# Patient Record
Sex: Female | Born: 1950
Health system: Southern US, Community
[De-identification: ages and names within clinical notes are randomized; demographics above are authoritative.]

## PROBLEM LIST (undated history)

## (undated) DIAGNOSIS — J439 Emphysema, unspecified: Secondary | ICD-10-CM

## (undated) DIAGNOSIS — R112 Nausea with vomiting, unspecified: Secondary | ICD-10-CM

## (undated) DIAGNOSIS — K449 Diaphragmatic hernia without obstruction or gangrene: Secondary | ICD-10-CM

## (undated) DIAGNOSIS — F419 Anxiety disorder, unspecified: Secondary | ICD-10-CM

## (undated) DIAGNOSIS — K5730 Diverticulosis of large intestine without perforation or abscess without bleeding: Secondary | ICD-10-CM

## (undated) DIAGNOSIS — E785 Hyperlipidemia, unspecified: Secondary | ICD-10-CM

## (undated) DIAGNOSIS — Z9889 Other specified postprocedural states: Secondary | ICD-10-CM

## (undated) DIAGNOSIS — R519 Headache, unspecified: Secondary | ICD-10-CM

## (undated) DIAGNOSIS — K219 Gastro-esophageal reflux disease without esophagitis: Secondary | ICD-10-CM

## (undated) DIAGNOSIS — H409 Unspecified glaucoma: Secondary | ICD-10-CM

## (undated) DIAGNOSIS — M1990 Unspecified osteoarthritis, unspecified site: Secondary | ICD-10-CM

## (undated) DIAGNOSIS — R51 Headache: Secondary | ICD-10-CM

## (undated) DIAGNOSIS — I10 Essential (primary) hypertension: Secondary | ICD-10-CM

## (undated) DIAGNOSIS — L309 Dermatitis, unspecified: Secondary | ICD-10-CM

## (undated) DIAGNOSIS — F32A Depression, unspecified: Secondary | ICD-10-CM

## (undated) DIAGNOSIS — H269 Unspecified cataract: Secondary | ICD-10-CM

## (undated) DIAGNOSIS — F329 Major depressive disorder, single episode, unspecified: Secondary | ICD-10-CM

## (undated) DIAGNOSIS — G8929 Other chronic pain: Secondary | ICD-10-CM

## (undated) DIAGNOSIS — D649 Anemia, unspecified: Secondary | ICD-10-CM

## (undated) DIAGNOSIS — E041 Nontoxic single thyroid nodule: Secondary | ICD-10-CM

## (undated) DIAGNOSIS — I2510 Atherosclerotic heart disease of native coronary artery without angina pectoris: Secondary | ICD-10-CM

## (undated) HISTORY — PX: SPINE SURGERY: SHX786

## (undated) HISTORY — DX: Hyperlipidemia, unspecified: E78.5

## (undated) HISTORY — PX: TUBAL LIGATION: SHX77

## (undated) HISTORY — DX: Unspecified glaucoma: H40.9

## (undated) HISTORY — DX: Essential (primary) hypertension: I10

## (undated) HISTORY — DX: Diverticulosis of large intestine without perforation or abscess without bleeding: K57.30

## (undated) HISTORY — PX: CHOLECYSTECTOMY: SHX55

## (undated) HISTORY — DX: Gastro-esophageal reflux disease without esophagitis: K21.9

## (undated) HISTORY — DX: Dermatitis, unspecified: L30.9

## (undated) HISTORY — DX: Anemia, unspecified: D64.9

## (undated) HISTORY — DX: Emphysema, unspecified: J43.9

## (undated) HISTORY — DX: Anxiety disorder, unspecified: F41.9

## (undated) HISTORY — PX: COSMETIC SURGERY: SHX468

## (undated) HISTORY — DX: Unspecified cataract: H26.9

## (undated) HISTORY — DX: Nontoxic single thyroid nodule: E04.1

## (undated) HISTORY — DX: Major depressive disorder, single episode, unspecified: F32.9

## (undated) HISTORY — DX: Diaphragmatic hernia without obstruction or gangrene: K44.9

## (undated) HISTORY — DX: Unspecified osteoarthritis, unspecified site: M19.90

## (undated) HISTORY — PX: EYE SURGERY: SHX253

## (undated) HISTORY — DX: Depression, unspecified: F32.A

## (undated) HISTORY — DX: Headache: R51

## (undated) HISTORY — DX: Headache, unspecified: R51.9

## (undated) HISTORY — PX: CATARACT EXTRACTION, BILATERAL: SHX1313

## (undated) HISTORY — DX: Other chronic pain: G89.29

---

## 1999-03-29 ENCOUNTER — Other Ambulatory Visit: Admission: RE | Admit: 1999-03-29 | Discharge: 1999-03-29 | Payer: Self-pay | Admitting: Gynecology

## 1999-10-04 ENCOUNTER — Encounter: Payer: Self-pay | Admitting: Emergency Medicine

## 1999-10-04 ENCOUNTER — Emergency Department (HOSPITAL_COMMUNITY): Admission: EM | Admit: 1999-10-04 | Discharge: 1999-10-04 | Payer: Self-pay | Admitting: Emergency Medicine

## 1999-10-31 ENCOUNTER — Encounter: Admission: RE | Admit: 1999-10-31 | Discharge: 2000-01-29 | Payer: Self-pay | Admitting: Family Medicine

## 1999-11-10 ENCOUNTER — Encounter: Payer: Self-pay | Admitting: Family Medicine

## 1999-11-10 ENCOUNTER — Ambulatory Visit (HOSPITAL_COMMUNITY): Admission: RE | Admit: 1999-11-10 | Discharge: 1999-11-10 | Payer: Self-pay | Admitting: Family Medicine

## 1999-12-09 ENCOUNTER — Ambulatory Visit (HOSPITAL_COMMUNITY): Admission: RE | Admit: 1999-12-09 | Discharge: 1999-12-09 | Payer: Self-pay | Admitting: Gastroenterology

## 1999-12-09 ENCOUNTER — Encounter: Payer: Self-pay | Admitting: Gastroenterology

## 1999-12-16 ENCOUNTER — Ambulatory Visit (HOSPITAL_COMMUNITY): Admission: RE | Admit: 1999-12-16 | Discharge: 1999-12-16 | Payer: Self-pay | Admitting: Gynecology

## 1999-12-16 ENCOUNTER — Encounter: Payer: Self-pay | Admitting: Gynecology

## 2000-01-20 ENCOUNTER — Encounter: Payer: Self-pay | Admitting: Family Medicine

## 2000-01-20 ENCOUNTER — Inpatient Hospital Stay (HOSPITAL_COMMUNITY): Admission: EM | Admit: 2000-01-20 | Discharge: 2000-01-23 | Payer: Self-pay | Admitting: Family Medicine

## 2000-01-22 ENCOUNTER — Encounter: Payer: Self-pay | Admitting: Family Medicine

## 2000-05-07 ENCOUNTER — Other Ambulatory Visit: Admission: RE | Admit: 2000-05-07 | Discharge: 2000-05-07 | Payer: Self-pay | Admitting: Gynecology

## 2000-05-09 ENCOUNTER — Encounter (INDEPENDENT_AMBULATORY_CARE_PROVIDER_SITE_OTHER): Payer: Self-pay | Admitting: Specialist

## 2000-05-09 ENCOUNTER — Other Ambulatory Visit: Admission: RE | Admit: 2000-05-09 | Discharge: 2000-05-09 | Payer: Self-pay | Admitting: Gynecology

## 2000-06-09 ENCOUNTER — Emergency Department (HOSPITAL_COMMUNITY): Admission: EM | Admit: 2000-06-09 | Discharge: 2000-06-09 | Payer: Self-pay | Admitting: Emergency Medicine

## 2000-06-09 ENCOUNTER — Encounter: Payer: Self-pay | Admitting: Emergency Medicine

## 2001-06-26 DIAGNOSIS — K449 Diaphragmatic hernia without obstruction or gangrene: Secondary | ICD-10-CM

## 2001-06-26 HISTORY — DX: Diaphragmatic hernia without obstruction or gangrene: K44.9

## 2001-09-16 ENCOUNTER — Other Ambulatory Visit: Admission: RE | Admit: 2001-09-16 | Discharge: 2001-09-16 | Payer: Self-pay | Admitting: Gynecology

## 2001-11-13 ENCOUNTER — Ambulatory Visit (HOSPITAL_COMMUNITY): Admission: RE | Admit: 2001-11-13 | Discharge: 2001-11-13 | Payer: Self-pay | Admitting: Gastroenterology

## 2002-10-28 ENCOUNTER — Other Ambulatory Visit: Admission: RE | Admit: 2002-10-28 | Discharge: 2002-10-28 | Payer: Self-pay | Admitting: Gynecology

## 2003-07-16 ENCOUNTER — Encounter: Admission: RE | Admit: 2003-07-16 | Discharge: 2003-07-16 | Payer: Self-pay | Admitting: Family Medicine

## 2003-08-03 ENCOUNTER — Encounter (HOSPITAL_COMMUNITY): Admission: RE | Admit: 2003-08-03 | Discharge: 2003-11-01 | Payer: Self-pay | Admitting: Family Medicine

## 2003-08-12 ENCOUNTER — Encounter: Admission: RE | Admit: 2003-08-12 | Discharge: 2003-08-12 | Payer: Self-pay | Admitting: Family Medicine

## 2004-03-10 ENCOUNTER — Encounter: Admission: RE | Admit: 2004-03-10 | Discharge: 2004-03-10 | Payer: Self-pay | Admitting: Family Medicine

## 2004-04-14 ENCOUNTER — Other Ambulatory Visit: Admission: RE | Admit: 2004-04-14 | Discharge: 2004-04-14 | Payer: Self-pay | Admitting: Gynecology

## 2004-04-28 ENCOUNTER — Ambulatory Visit: Payer: Self-pay | Admitting: Internal Medicine

## 2004-06-26 HISTORY — PX: CARPAL TUNNEL RELEASE: SHX101

## 2004-07-18 ENCOUNTER — Ambulatory Visit: Payer: Self-pay | Admitting: Internal Medicine

## 2004-09-09 ENCOUNTER — Ambulatory Visit: Payer: Self-pay | Admitting: Family Medicine

## 2004-09-12 ENCOUNTER — Ambulatory Visit: Payer: Self-pay | Admitting: Cardiology

## 2004-10-14 ENCOUNTER — Ambulatory Visit: Payer: Self-pay | Admitting: Cardiovascular Disease

## 2004-10-17 ENCOUNTER — Ambulatory Visit: Payer: Self-pay | Admitting: Cardiology

## 2004-10-18 ENCOUNTER — Ambulatory Visit: Payer: Self-pay | Admitting: Family Medicine

## 2004-12-26 ENCOUNTER — Ambulatory Visit: Payer: Self-pay | Admitting: Internal Medicine

## 2004-12-29 ENCOUNTER — Ambulatory Visit: Payer: Self-pay | Admitting: Internal Medicine

## 2005-02-24 ENCOUNTER — Ambulatory Visit: Payer: Self-pay | Admitting: Family Medicine

## 2005-03-01 ENCOUNTER — Ambulatory Visit: Payer: Self-pay | Admitting: Family Medicine

## 2005-03-09 ENCOUNTER — Ambulatory Visit: Payer: Self-pay | Admitting: Internal Medicine

## 2005-04-19 ENCOUNTER — Ambulatory Visit (HOSPITAL_BASED_OUTPATIENT_CLINIC_OR_DEPARTMENT_OTHER): Admission: RE | Admit: 2005-04-19 | Discharge: 2005-04-19 | Payer: Self-pay | Admitting: Orthopedic Surgery

## 2005-04-19 ENCOUNTER — Ambulatory Visit (HOSPITAL_COMMUNITY): Admission: RE | Admit: 2005-04-19 | Discharge: 2005-04-19 | Payer: Self-pay | Admitting: Orthopedic Surgery

## 2005-07-06 ENCOUNTER — Encounter: Payer: Self-pay | Admitting: Family Medicine

## 2005-07-06 ENCOUNTER — Other Ambulatory Visit: Admission: RE | Admit: 2005-07-06 | Discharge: 2005-07-06 | Payer: Self-pay | Admitting: Family Medicine

## 2005-07-06 ENCOUNTER — Ambulatory Visit: Payer: Self-pay | Admitting: Family Medicine

## 2005-07-12 ENCOUNTER — Ambulatory Visit: Payer: Self-pay | Admitting: Family Medicine

## 2005-07-19 ENCOUNTER — Ambulatory Visit: Payer: Self-pay | Admitting: Family Medicine

## 2005-08-08 ENCOUNTER — Encounter: Admission: RE | Admit: 2005-08-08 | Discharge: 2005-08-08 | Payer: Self-pay | Admitting: Family Medicine

## 2006-04-17 ENCOUNTER — Ambulatory Visit: Payer: Self-pay | Admitting: Family Medicine

## 2006-04-24 ENCOUNTER — Ambulatory Visit: Payer: Self-pay | Admitting: Family Medicine

## 2006-04-24 LAB — CONVERTED CEMR LAB
ALT: 31 units/L (ref 0–40)
AST: 24 units/L (ref 0–37)
Albumin: 3.9 g/dL (ref 3.5–5.2)
Alkaline Phosphatase: 85 units/L (ref 39–117)
BUN: 12 mg/dL (ref 6–23)
CO2: 30 meq/L (ref 19–32)
Calcium: 10 mg/dL (ref 8.4–10.5)
Chloride: 101 meq/L (ref 96–112)
Chol/HDL Ratio, serum: 6.7
Cholesterol: 236 mg/dL (ref 0–200)
Creatinine, Ser: 0.9 mg/dL (ref 0.4–1.2)
Creatinine,U: 54.2 mg/dL
GFR calc non Af Amer: 69 mL/min
Glomerular Filtration Rate, Af Am: 83 mL/min/{1.73_m2}
Glucose, Bld: 143 mg/dL — ABNORMAL HIGH (ref 70–99)
HDL: 35.1 mg/dL — ABNORMAL LOW (ref >39.0)
Hgb A1c MFr Bld: 6.6 % — ABNORMAL HIGH (ref 4.6–6.0)
LDL DIRECT: 163.9 mg/dL
Lipase: 19 units/L (ref 11.0–59.0)
Microalb Creat Ratio: 5.5 mg/g (ref 0.0–30.0)
Microalb, Ur: 0.3 mg/dL (ref 0.0–1.9)
Potassium: 3.9 meq/L (ref 3.5–5.1)
Sodium: 139 meq/L (ref 135–145)
Total Bilirubin: 0.5 mg/dL (ref 0.3–1.2)
Total Protein: 7.3 g/dL (ref 6.0–8.3)
Triglyceride fasting, serum: 216 mg/dL (ref 0–149)
VLDL: 43 mg/dL — ABNORMAL HIGH (ref 0–40)

## 2006-05-04 ENCOUNTER — Ambulatory Visit: Payer: Self-pay | Admitting: *Deleted

## 2006-05-11 ENCOUNTER — Ambulatory Visit: Payer: Self-pay | Admitting: *Deleted

## 2006-05-23 ENCOUNTER — Ambulatory Visit: Payer: Self-pay | Admitting: *Deleted

## 2006-07-16 ENCOUNTER — Other Ambulatory Visit: Admission: RE | Admit: 2006-07-16 | Discharge: 2006-07-16 | Payer: Self-pay | Admitting: Family Medicine

## 2006-07-16 ENCOUNTER — Encounter: Payer: Self-pay | Admitting: Family Medicine

## 2006-07-16 ENCOUNTER — Ambulatory Visit: Payer: Self-pay | Admitting: Family Medicine

## 2006-07-16 LAB — CONVERTED CEMR LAB
ALT: 32 units/L (ref 0–40)
AST: 23 units/L (ref 0–37)
Albumin: 4.1 g/dL (ref 3.5–5.2)
Alkaline Phosphatase: 104 units/L (ref 39–117)
BUN: 8 mg/dL (ref 6–23)
Basophils Absolute: 0.1 10*3/uL (ref 0.0–0.1)
Basophils Relative: 0.9 % (ref 0.0–1.0)
CO2: 30 meq/L (ref 19–32)
Calcium: 9.8 mg/dL (ref 8.4–10.5)
Chloride: 101 meq/L (ref 96–112)
Cholesterol: 218 mg/dL (ref 0–200)
Creatinine, Ser: 0.9 mg/dL (ref 0.4–1.2)
Creatinine,U: 39.1 mg/dL
Direct LDL: 150.6 mg/dL
Eosinophils Relative: 2 % (ref 0.0–5.0)
GFR calc Af Amer: 83 mL/min
GFR calc non Af Amer: 69 mL/min
Glucose, Bld: 125 mg/dL — ABNORMAL HIGH (ref 70–99)
HCT: 42.2 % (ref 36.0–46.0)
HDL: 46.8 mg/dL (ref >39.0)
Hemoglobin: 14.8 g/dL (ref 12.0–15.0)
Hgb A1c MFr Bld: 6.7 % — ABNORMAL HIGH (ref 4.6–6.0)
Lymphocytes Relative: 33.9 % (ref 12.0–46.0)
MCHC: 35.1 g/dL (ref 30.0–36.0)
MCV: 89.8 fL (ref 78.0–100.0)
Microalb Creat Ratio: 10.2 mg/g (ref 0.0–30.0)
Microalb, Ur: 0.4 mg/dL (ref 0.0–1.9)
Monocytes Absolute: 0.4 10*3/uL (ref 0.2–0.7)
Monocytes Relative: 5.9 % (ref 3.0–11.0)
Neutro Abs: 4 10*3/uL (ref 1.4–7.7)
Neutrophils Relative %: 57.3 % (ref 43.0–77.0)
Platelets: 252 10*3/uL (ref 150–400)
Potassium: 3.9 meq/L (ref 3.5–5.1)
RBC: 4.7 M/uL (ref 3.87–5.11)
RDW: 13 % (ref 11.5–14.6)
Sodium: 141 meq/L (ref 135–145)
TSH: 1.98 microintl units/mL (ref 0.35–5.50)
Total Bilirubin: 0.6 mg/dL (ref 0.3–1.2)
Total CHOL/HDL Ratio: 4.7
Total Protein: 7.3 g/dL (ref 6.0–8.3)
Triglycerides: 173 mg/dL — ABNORMAL HIGH (ref 0–149)
VLDL: 35 mg/dL (ref 0–40)
WBC: 6.9 10*3/uL (ref 4.5–10.5)

## 2006-08-08 ENCOUNTER — Ambulatory Visit: Payer: Self-pay | Admitting: Gastroenterology

## 2006-08-22 ENCOUNTER — Ambulatory Visit: Payer: Self-pay | Admitting: Gastroenterology

## 2006-08-25 HISTORY — PX: CERVICAL FUSION: SHX112

## 2006-09-06 ENCOUNTER — Emergency Department (HOSPITAL_COMMUNITY): Admission: EM | Admit: 2006-09-06 | Discharge: 2006-09-06 | Payer: Self-pay | Admitting: Family Medicine

## 2006-09-07 ENCOUNTER — Emergency Department (HOSPITAL_COMMUNITY): Admission: EM | Admit: 2006-09-07 | Discharge: 2006-09-07 | Payer: Self-pay | Admitting: Emergency Medicine

## 2006-09-17 ENCOUNTER — Observation Stay (HOSPITAL_COMMUNITY): Admission: RE | Admit: 2006-09-17 | Discharge: 2006-09-18 | Payer: Self-pay | Admitting: Neurosurgery

## 2006-11-13 ENCOUNTER — Encounter: Payer: Self-pay | Admitting: Family Medicine

## 2006-11-22 DIAGNOSIS — E041 Nontoxic single thyroid nodule: Secondary | ICD-10-CM | POA: Insufficient documentation

## 2006-11-22 DIAGNOSIS — K219 Gastro-esophageal reflux disease without esophagitis: Secondary | ICD-10-CM | POA: Insufficient documentation

## 2006-11-22 DIAGNOSIS — I10 Essential (primary) hypertension: Secondary | ICD-10-CM | POA: Insufficient documentation

## 2006-11-22 DIAGNOSIS — E785 Hyperlipidemia, unspecified: Secondary | ICD-10-CM | POA: Insufficient documentation

## 2006-11-22 DIAGNOSIS — H409 Unspecified glaucoma: Secondary | ICD-10-CM | POA: Insufficient documentation

## 2006-11-22 DIAGNOSIS — E119 Type 2 diabetes mellitus without complications: Secondary | ICD-10-CM | POA: Insufficient documentation

## 2007-01-11 ENCOUNTER — Encounter: Admission: RE | Admit: 2007-01-11 | Discharge: 2007-01-11 | Payer: Self-pay | Admitting: Family Medicine

## 2007-01-14 ENCOUNTER — Encounter (INDEPENDENT_AMBULATORY_CARE_PROVIDER_SITE_OTHER): Payer: Self-pay | Admitting: *Deleted

## 2007-01-15 ENCOUNTER — Encounter: Payer: Self-pay | Admitting: Family Medicine

## 2007-01-21 ENCOUNTER — Emergency Department (HOSPITAL_COMMUNITY): Admission: EM | Admit: 2007-01-21 | Discharge: 2007-01-21 | Payer: Self-pay | Admitting: Emergency Medicine

## 2007-05-10 ENCOUNTER — Encounter: Payer: Self-pay | Admitting: Family Medicine

## 2007-05-20 ENCOUNTER — Encounter: Payer: Self-pay | Admitting: Family Medicine

## 2007-07-02 ENCOUNTER — Encounter: Payer: Self-pay | Admitting: Family Medicine

## 2007-09-05 ENCOUNTER — Emergency Department (HOSPITAL_COMMUNITY): Admission: EM | Admit: 2007-09-05 | Discharge: 2007-09-05 | Payer: Self-pay | Admitting: Emergency Medicine

## 2007-09-11 ENCOUNTER — Ambulatory Visit: Payer: Self-pay | Admitting: Family Medicine

## 2007-09-11 DIAGNOSIS — R5383 Other fatigue: Secondary | ICD-10-CM | POA: Insufficient documentation

## 2007-09-11 DIAGNOSIS — R5381 Other malaise: Secondary | ICD-10-CM | POA: Insufficient documentation

## 2007-09-16 ENCOUNTER — Encounter: Payer: Self-pay | Admitting: Family Medicine

## 2007-09-17 ENCOUNTER — Encounter: Payer: Self-pay | Admitting: Family Medicine

## 2007-09-19 LAB — CONVERTED CEMR LAB
ALT: 24 units/L (ref 0–35)
AST: 18 units/L (ref 0–37)
Albumin: 4 g/dL (ref 3.5–5.2)
Alkaline Phosphatase: 114 units/L (ref 39–117)
BUN: 11 mg/dL (ref 6–23)
Basophils Absolute: 0.1 10*3/uL (ref 0.0–0.1)
Basophils Relative: 1.1 % — ABNORMAL HIGH (ref 0.0–1.0)
Bilirubin, Direct: 0.1 mg/dL (ref 0.0–0.3)
CO2: 28 meq/L (ref 19–32)
CRP, High Sensitivity: 1 (ref 0.00–5.00)
Calcium: 10.7 mg/dL — ABNORMAL HIGH (ref 8.4–10.5)
Chloride: 101 meq/L (ref 96–112)
Cholesterol: 242 mg/dL (ref 0–200)
Creatinine, Ser: 0.7 mg/dL (ref 0.4–1.2)
Direct LDL: 158.7 mg/dL
Eosinophils Absolute: 0.1 10*3/uL (ref 0.0–0.6)
Eosinophils Relative: 2 % (ref 0.0–5.0)
Folate: 6.7 ng/mL
GFR calc Af Amer: 111 mL/min
GFR calc non Af Amer: 92 mL/min
Glucose, Bld: 121 mg/dL — ABNORMAL HIGH (ref 70–99)
HCT: 40.6 % (ref 36.0–46.0)
HDL: 36.8 mg/dL — ABNORMAL LOW (ref >39.0)
Hemoglobin: 13.6 g/dL (ref 12.0–15.0)
Hgb A1c MFr Bld: 6.5 % — ABNORMAL HIGH (ref 4.6–6.0)
Lymphocytes Relative: 33.3 % (ref 12.0–46.0)
MCHC: 33.4 g/dL (ref 30.0–36.0)
MCV: 90 fL (ref 78.0–100.0)
Monocytes Absolute: 0.6 10*3/uL (ref 0.2–0.7)
Monocytes Relative: 8.4 % (ref 3.0–11.0)
Neutro Abs: 4.1 10*3/uL (ref 1.4–7.7)
Neutrophils Relative %: 55.2 % (ref 43.0–77.0)
Platelets: 252 10*3/uL (ref 150–400)
Potassium: 3.7 meq/L (ref 3.5–5.1)
RBC: 4.52 M/uL (ref 3.87–5.11)
RDW: 13.3 % (ref 11.5–14.6)
Sodium: 138 meq/L (ref 135–145)
TSH: 2.44 microintl units/mL (ref 0.35–5.50)
Total Bilirubin: 0.6 mg/dL (ref 0.3–1.2)
Total CHOL/HDL Ratio: 6.6
Total Protein: 7 g/dL (ref 6.0–8.3)
Triglycerides: 326 mg/dL (ref 0–149)
VLDL: 65 mg/dL — ABNORMAL HIGH (ref 0–40)
Vit D, 1,25-Dihydroxy: 15 — ABNORMAL LOW (ref 30–89)
Vitamin B-12: 177 pg/mL — ABNORMAL LOW (ref 211–911)
WBC: 7.3 10*3/uL (ref 4.5–10.5)

## 2007-09-20 ENCOUNTER — Telehealth (INDEPENDENT_AMBULATORY_CARE_PROVIDER_SITE_OTHER): Payer: Self-pay | Admitting: *Deleted

## 2007-09-20 ENCOUNTER — Ambulatory Visit: Payer: Self-pay | Admitting: Family Medicine

## 2007-09-24 ENCOUNTER — Telehealth (INDEPENDENT_AMBULATORY_CARE_PROVIDER_SITE_OTHER): Payer: Self-pay | Admitting: *Deleted

## 2007-09-24 ENCOUNTER — Encounter: Admission: RE | Admit: 2007-09-24 | Discharge: 2007-09-24 | Payer: Self-pay | Admitting: Family Medicine

## 2007-09-24 ENCOUNTER — Encounter (INDEPENDENT_AMBULATORY_CARE_PROVIDER_SITE_OTHER): Payer: Self-pay | Admitting: *Deleted

## 2007-09-27 ENCOUNTER — Encounter: Payer: Self-pay | Admitting: Family Medicine

## 2007-10-15 ENCOUNTER — Ambulatory Visit: Payer: Self-pay | Admitting: Family Medicine

## 2007-10-15 LAB — CONVERTED CEMR LAB
ALT: 24 units/L (ref 0–35)
AST: 18 units/L (ref 0–37)
Albumin: 3.6 g/dL (ref 3.5–5.2)
Alkaline Phosphatase: 115 units/L (ref 39–117)
Bilirubin, Direct: 0.1 mg/dL (ref 0.0–0.3)
Cholesterol: 170 mg/dL (ref 0–200)
HDL: 39 mg/dL (ref >39.0)
LDL Cholesterol: 99 mg/dL (ref 0–99)
Total Bilirubin: 0.6 mg/dL (ref 0.3–1.2)
Total CHOL/HDL Ratio: 4.4
Total Protein: 7 g/dL (ref 6.0–8.3)
Triglycerides: 159 mg/dL — ABNORMAL HIGH (ref 0–149)
VLDL: 32 mg/dL (ref 0–40)

## 2007-10-17 ENCOUNTER — Ambulatory Visit: Payer: Self-pay | Admitting: Internal Medicine

## 2007-11-27 ENCOUNTER — Encounter: Payer: Self-pay | Admitting: Family Medicine

## 2007-12-12 ENCOUNTER — Encounter: Payer: Self-pay | Admitting: Family Medicine

## 2007-12-12 ENCOUNTER — Ambulatory Visit: Payer: Self-pay | Admitting: Family Medicine

## 2007-12-12 ENCOUNTER — Other Ambulatory Visit: Admission: RE | Admit: 2007-12-12 | Discharge: 2007-12-12 | Payer: Self-pay | Admitting: Family Medicine

## 2007-12-12 DIAGNOSIS — N951 Menopausal and female climacteric states: Secondary | ICD-10-CM | POA: Insufficient documentation

## 2007-12-12 LAB — CONVERTED CEMR LAB
ALT: 25 units/L (ref 0–35)
AST: 18 units/L (ref 0–37)
Albumin: 3.9 g/dL (ref 3.5–5.2)
Alkaline Phosphatase: 115 units/L (ref 39–117)
BUN: 10 mg/dL (ref 6–23)
Basophils Absolute: 0 10*3/uL (ref 0.0–0.1)
Basophils Relative: 0.4 % (ref 0.0–1.0)
Bilirubin Urine: NEGATIVE
Bilirubin, Direct: 0.1 mg/dL (ref 0.0–0.3)
CO2: 29 meq/L (ref 19–32)
Calcium: 9.7 mg/dL (ref 8.4–10.5)
Chloride: 101 meq/L (ref 96–112)
Creatinine, Ser: 0.8 mg/dL (ref 0.4–1.2)
Creatinine,U: 39.1 mg/dL
Eosinophils Absolute: 0.1 10*3/uL (ref 0.0–0.7)
Eosinophils Relative: 1.7 % (ref 0.0–5.0)
GFR calc Af Amer: 95 mL/min
GFR calc non Af Amer: 79 mL/min
Glucose, Bld: 122 mg/dL — ABNORMAL HIGH (ref 70–99)
Glucose, Urine, Semiquant: NEGATIVE
HCT: 40.3 % (ref 36.0–46.0)
Hemoglobin: 14 g/dL (ref 12.0–15.0)
Hgb A1c MFr Bld: 6.8 % — ABNORMAL HIGH (ref 4.6–6.0)
Ketones, urine, test strip: NEGATIVE
Lymphocytes Relative: 29.8 % (ref 12.0–46.0)
MCHC: 34.6 g/dL (ref 30.0–36.0)
MCV: 89.9 fL (ref 78.0–100.0)
Microalb Creat Ratio: 5.1 mg/g (ref 0.0–30.0)
Microalb, Ur: 0.2 mg/dL (ref 0.0–1.9)
Monocytes Absolute: 0.5 10*3/uL (ref 0.1–1.0)
Monocytes Relative: 6.1 % (ref 3.0–12.0)
Neutro Abs: 5.4 10*3/uL (ref 1.4–7.7)
Neutrophils Relative %: 62 % (ref 43.0–77.0)
Nitrite: NEGATIVE
Platelets: 251 10*3/uL (ref 150–400)
Potassium: 3.5 meq/L (ref 3.5–5.1)
Protein, U semiquant: NEGATIVE
RBC: 4.48 M/uL (ref 3.87–5.11)
RDW: 13.3 % (ref 11.5–14.6)
Sodium: 139 meq/L (ref 135–145)
Specific Gravity, Urine: <1.005
TSH: 1.24 microintl units/mL (ref 0.35–5.50)
Total Bilirubin: 0.5 mg/dL (ref 0.3–1.2)
Total Protein: 7 g/dL (ref 6.0–8.3)
Urobilinogen, UA: 0.2
WBC Urine, dipstick: NEGATIVE
WBC: 8.5 10*3/uL (ref 4.5–10.5)
pH: 8

## 2007-12-13 ENCOUNTER — Encounter (INDEPENDENT_AMBULATORY_CARE_PROVIDER_SITE_OTHER): Payer: Self-pay | Admitting: *Deleted

## 2007-12-19 ENCOUNTER — Encounter (INDEPENDENT_AMBULATORY_CARE_PROVIDER_SITE_OTHER): Payer: Self-pay | Admitting: *Deleted

## 2007-12-24 ENCOUNTER — Ambulatory Visit (HOSPITAL_BASED_OUTPATIENT_CLINIC_OR_DEPARTMENT_OTHER): Admission: RE | Admit: 2007-12-24 | Discharge: 2007-12-24 | Payer: Self-pay | Admitting: Orthopedic Surgery

## 2007-12-24 HISTORY — PX: CARPAL TUNNEL RELEASE: SHX101

## 2008-01-01 ENCOUNTER — Encounter: Admission: RE | Admit: 2008-01-01 | Discharge: 2008-01-01 | Payer: Self-pay | Admitting: Family Medicine

## 2008-01-01 ENCOUNTER — Encounter: Payer: Self-pay | Admitting: Family Medicine

## 2008-01-02 ENCOUNTER — Encounter (INDEPENDENT_AMBULATORY_CARE_PROVIDER_SITE_OTHER): Payer: Self-pay | Admitting: *Deleted

## 2008-01-02 ENCOUNTER — Encounter: Payer: Self-pay | Admitting: Family Medicine

## 2008-01-08 ENCOUNTER — Encounter: Payer: Self-pay | Admitting: Family Medicine

## 2008-01-08 ENCOUNTER — Encounter (INDEPENDENT_AMBULATORY_CARE_PROVIDER_SITE_OTHER): Payer: Self-pay | Admitting: *Deleted

## 2008-01-29 ENCOUNTER — Encounter: Payer: Self-pay | Admitting: Family Medicine

## 2008-03-11 ENCOUNTER — Encounter: Payer: Self-pay | Admitting: Family Medicine

## 2008-03-25 ENCOUNTER — Encounter: Payer: Self-pay | Admitting: Family Medicine

## 2008-09-28 ENCOUNTER — Emergency Department (HOSPITAL_COMMUNITY): Admission: EM | Admit: 2008-09-28 | Discharge: 2008-09-28 | Payer: Self-pay | Admitting: Emergency Medicine

## 2008-10-07 ENCOUNTER — Telehealth: Payer: Self-pay | Admitting: Family Medicine

## 2008-10-12 ENCOUNTER — Telehealth (INDEPENDENT_AMBULATORY_CARE_PROVIDER_SITE_OTHER): Payer: Self-pay | Admitting: *Deleted

## 2008-10-14 ENCOUNTER — Encounter: Payer: Self-pay | Admitting: Family Medicine

## 2008-10-15 ENCOUNTER — Telehealth (INDEPENDENT_AMBULATORY_CARE_PROVIDER_SITE_OTHER): Payer: Self-pay | Admitting: *Deleted

## 2008-11-03 ENCOUNTER — Encounter: Payer: Self-pay | Admitting: Family Medicine

## 2008-11-03 ENCOUNTER — Ambulatory Visit: Payer: Self-pay | Admitting: Family Medicine

## 2008-11-03 ENCOUNTER — Other Ambulatory Visit: Admission: RE | Admit: 2008-11-03 | Discharge: 2008-11-03 | Payer: Self-pay | Admitting: Family Medicine

## 2008-11-03 LAB — CONVERTED CEMR LAB
ALT: 24 units/L (ref 0–35)
AST: 24 units/L (ref 0–37)
Albumin: 3.9 g/dL (ref 3.5–5.2)
Alkaline Phosphatase: 125 units/L — ABNORMAL HIGH (ref 39–117)
BUN: 14 mg/dL (ref 6–23)
Basophils Absolute: 0 10*3/uL (ref 0.0–0.1)
Basophils Relative: 0.3 % (ref 0.0–3.0)
Bilirubin, Direct: 0 mg/dL (ref 0.0–0.3)
CO2: 28 meq/L (ref 19–32)
Calcium: 10 mg/dL (ref 8.4–10.5)
Chloride: 100 meq/L (ref 96–112)
Cholesterol: 215 mg/dL — ABNORMAL HIGH (ref 0–200)
Creatinine, Ser: 0.7 mg/dL (ref 0.4–1.2)
Creatinine,U: 40.7 mg/dL
Direct LDL: 158.6 mg/dL
Eosinophils Absolute: 0.2 10*3/uL (ref 0.0–0.7)
Eosinophils Relative: 2 % (ref 0.0–5.0)
GFR calc non Af Amer: 91.15 mL/min (ref >60)
Glucose, Bld: 116 mg/dL — ABNORMAL HIGH (ref 70–99)
HCT: 38.5 % (ref 36.0–46.0)
HDL: 38.4 mg/dL — ABNORMAL LOW (ref >39.00)
Hemoglobin: 13.2 g/dL (ref 12.0–15.0)
Hgb A1c MFr Bld: 6.8 % — ABNORMAL HIGH (ref 4.6–6.5)
Lymphocytes Relative: 33.6 % (ref 12.0–46.0)
Lymphs Abs: 2.6 10*3/uL (ref 0.7–4.0)
MCHC: 34.3 g/dL (ref 30.0–36.0)
MCV: 88.3 fL (ref 78.0–100.0)
Microalb Creat Ratio: 4.9 mg/g (ref 0.0–30.0)
Microalb, Ur: 0.2 mg/dL (ref 0.0–1.9)
Monocytes Absolute: 0.5 10*3/uL (ref 0.1–1.0)
Monocytes Relative: 6.4 % (ref 3.0–12.0)
Neutro Abs: 4.4 10*3/uL (ref 1.4–7.7)
Neutrophils Relative %: 57.7 % (ref 43.0–77.0)
Platelets: 246 10*3/uL (ref 150.0–400.0)
Potassium: 4 meq/L (ref 3.5–5.1)
RBC: 4.36 M/uL (ref 3.87–5.11)
RDW: 14.1 % (ref 11.5–14.6)
Sodium: 141 meq/L (ref 135–145)
TSH: 1.57 microintl units/mL (ref 0.35–5.50)
Total Bilirubin: 0.6 mg/dL (ref 0.3–1.2)
Total CHOL/HDL Ratio: 6
Total Protein: 7 g/dL (ref 6.0–8.3)
Triglycerides: 151 mg/dL — ABNORMAL HIGH (ref 0.0–149.0)
VLDL: 30.2 mg/dL (ref 0.0–40.0)
WBC: 7.7 10*3/uL (ref 4.5–10.5)

## 2008-11-04 ENCOUNTER — Encounter (INDEPENDENT_AMBULATORY_CARE_PROVIDER_SITE_OTHER): Payer: Self-pay | Admitting: *Deleted

## 2008-11-06 ENCOUNTER — Encounter (INDEPENDENT_AMBULATORY_CARE_PROVIDER_SITE_OTHER): Payer: Self-pay | Admitting: *Deleted

## 2008-11-06 LAB — CONVERTED CEMR LAB: Vit D, 25-Hydroxy: 28 ng/mL — ABNORMAL LOW (ref 30–89)

## 2009-03-03 ENCOUNTER — Encounter: Admission: RE | Admit: 2009-03-03 | Discharge: 2009-03-03 | Payer: Self-pay | Admitting: Family Medicine

## 2009-03-10 ENCOUNTER — Telehealth (INDEPENDENT_AMBULATORY_CARE_PROVIDER_SITE_OTHER): Payer: Self-pay | Admitting: *Deleted

## 2009-07-07 ENCOUNTER — Telehealth: Payer: Self-pay | Admitting: Family Medicine

## 2009-07-14 ENCOUNTER — Ambulatory Visit: Payer: Self-pay | Admitting: Family Medicine

## 2009-07-26 LAB — CONVERTED CEMR LAB
ALT: 35 units/L (ref 0–35)
AST: 30 units/L (ref 0–37)
Albumin: 4 g/dL (ref 3.5–5.2)
Alkaline Phosphatase: 115 units/L (ref 39–117)
BUN: 11 mg/dL (ref 6–23)
Bilirubin, Direct: 0 mg/dL (ref 0.0–0.3)
CO2: 30 meq/L (ref 19–32)
Calcium: 9.5 mg/dL (ref 8.4–10.5)
Chloride: 99 meq/L (ref 96–112)
Cholesterol: 177 mg/dL (ref 0–200)
Creatinine, Ser: 0.7 mg/dL (ref 0.4–1.2)
GFR calc non Af Amer: 90.93 mL/min (ref >60)
Glucose, Bld: 122 mg/dL — ABNORMAL HIGH (ref 70–99)
HDL: 43 mg/dL (ref >39.00)
Hgb A1c MFr Bld: 6.9 % — ABNORMAL HIGH (ref 4.6–6.5)
LDL Cholesterol: 105 mg/dL — ABNORMAL HIGH (ref 0–99)
Potassium: 3.8 meq/L (ref 3.5–5.1)
Sodium: 137 meq/L (ref 135–145)
Total Bilirubin: 0.5 mg/dL (ref 0.3–1.2)
Total CHOL/HDL Ratio: 4
Total Protein: 7.2 g/dL (ref 6.0–8.3)
Triglycerides: 147 mg/dL (ref 0.0–149.0)
VLDL: 29.4 mg/dL (ref 0.0–40.0)
Vit D, 25-Hydroxy: 25 ng/mL — ABNORMAL LOW (ref 30–89)

## 2009-08-12 ENCOUNTER — Encounter: Admission: RE | Admit: 2009-08-12 | Discharge: 2009-08-12 | Payer: Self-pay | Admitting: Orthopedic Surgery

## 2009-10-04 ENCOUNTER — Telehealth (INDEPENDENT_AMBULATORY_CARE_PROVIDER_SITE_OTHER): Payer: Self-pay | Admitting: *Deleted

## 2010-02-09 ENCOUNTER — Encounter: Admission: RE | Admit: 2010-02-09 | Discharge: 2010-02-09 | Payer: Self-pay | Admitting: Family Medicine

## 2010-02-09 LAB — HM MAMMOGRAPHY: HM Mammogram: NORMAL

## 2010-02-23 ENCOUNTER — Ambulatory Visit: Payer: Self-pay | Admitting: Family Medicine

## 2010-02-23 ENCOUNTER — Other Ambulatory Visit: Admission: RE | Admit: 2010-02-23 | Discharge: 2010-02-23 | Payer: Self-pay | Admitting: Family Medicine

## 2010-02-23 DIAGNOSIS — H269 Unspecified cataract: Secondary | ICD-10-CM | POA: Insufficient documentation

## 2010-02-23 LAB — HM COLONOSCOPY: HM Colonoscopy: NORMAL

## 2010-02-24 LAB — CONVERTED CEMR LAB
Creatinine,U: 50.3 mg/dL
Microalb Creat Ratio: 6.2 mg/g (ref 0.0–30.0)
Microalb, Ur: 3.1 mg/dL — ABNORMAL HIGH (ref 0.0–1.9)
Vit D, 25-Hydroxy: 43 ng/mL (ref 30–89)

## 2010-02-25 ENCOUNTER — Telehealth (INDEPENDENT_AMBULATORY_CARE_PROVIDER_SITE_OTHER): Payer: Self-pay | Admitting: *Deleted

## 2010-02-25 LAB — CONVERTED CEMR LAB
ALT: 38 units/L — ABNORMAL HIGH (ref 0–35)
AST: 30 units/L (ref 0–37)
Albumin: 4 g/dL (ref 3.5–5.2)
Alkaline Phosphatase: 119 units/L — ABNORMAL HIGH (ref 39–117)
BUN: 13 mg/dL (ref 6–23)
Basophils Absolute: 0.1 10*3/uL (ref 0.0–0.1)
Basophils Relative: 0.6 % (ref 0.0–3.0)
Bilirubin, Direct: 0.1 mg/dL (ref 0.0–0.3)
CO2: 29 meq/L (ref 19–32)
Calcium: 10.2 mg/dL (ref 8.4–10.5)
Chloride: 97 meq/L (ref 96–112)
Cholesterol: 183 mg/dL (ref 0–200)
Creatinine, Ser: 0.7 mg/dL (ref 0.4–1.2)
Eosinophils Absolute: 0.2 10*3/uL (ref 0.0–0.7)
Eosinophils Relative: 1.4 % (ref 0.0–5.0)
Free T4: 1 ng/dL (ref 0.60–1.60)
GFR calc non Af Amer: 86.45 mL/min (ref >60)
Glucose, Bld: 117 mg/dL — ABNORMAL HIGH (ref 70–99)
HCT: 41.1 % (ref 36.0–46.0)
HDL: 52 mg/dL (ref >39.00)
Hemoglobin: 13.9 g/dL (ref 12.0–15.0)
Hgb A1c MFr Bld: 7 % — ABNORMAL HIGH (ref 4.6–6.5)
LDL Cholesterol: 112 mg/dL — ABNORMAL HIGH (ref 0–99)
Lymphocytes Relative: 27.6 % (ref 12.0–46.0)
Lymphs Abs: 3.2 10*3/uL (ref 0.7–4.0)
MCHC: 33.8 g/dL (ref 30.0–36.0)
MCV: 90.3 fL (ref 78.0–100.0)
Monocytes Absolute: 0.6 10*3/uL (ref 0.1–1.0)
Monocytes Relative: 5.3 % (ref 3.0–12.0)
Neutro Abs: 7.5 10*3/uL (ref 1.4–7.7)
Neutrophils Relative %: 65.1 % (ref 43.0–77.0)
Platelets: 306 10*3/uL (ref 150.0–400.0)
Potassium: 4.5 meq/L (ref 3.5–5.1)
RBC: 4.55 M/uL (ref 3.87–5.11)
RDW: 16.1 % — ABNORMAL HIGH (ref 11.5–14.6)
Sodium: 138 meq/L (ref 135–145)
T3, Free: 2.8 pg/mL (ref 2.3–4.2)
TSH: 2.09 microintl units/mL (ref 0.35–5.50)
Total Bilirubin: 0.6 mg/dL (ref 0.3–1.2)
Total CHOL/HDL Ratio: 4
Total Protein: 7.2 g/dL (ref 6.0–8.3)
Triglycerides: 96 mg/dL (ref 0.0–149.0)
VLDL: 19.2 mg/dL (ref 0.0–40.0)
WBC: 11.6 10*3/uL — ABNORMAL HIGH (ref 4.5–10.5)

## 2010-03-01 LAB — CONVERTED CEMR LAB: Pap Smear: NEGATIVE

## 2010-03-02 ENCOUNTER — Ambulatory Visit: Payer: Self-pay | Admitting: Internal Medicine

## 2010-03-02 ENCOUNTER — Encounter: Payer: Self-pay | Admitting: Family Medicine

## 2010-03-16 ENCOUNTER — Telehealth: Payer: Self-pay | Admitting: Family Medicine

## 2010-04-15 ENCOUNTER — Telehealth (INDEPENDENT_AMBULATORY_CARE_PROVIDER_SITE_OTHER): Payer: Self-pay | Admitting: *Deleted

## 2010-04-26 ENCOUNTER — Ambulatory Visit: Payer: Self-pay | Admitting: Family Medicine

## 2010-04-26 DIAGNOSIS — J019 Acute sinusitis, unspecified: Secondary | ICD-10-CM | POA: Insufficient documentation

## 2010-04-26 DIAGNOSIS — J069 Acute upper respiratory infection, unspecified: Secondary | ICD-10-CM | POA: Insufficient documentation

## 2010-04-28 ENCOUNTER — Encounter: Payer: Self-pay | Admitting: Family Medicine

## 2010-05-31 LAB — HM DIABETES EYE EXAM

## 2010-06-06 ENCOUNTER — Ambulatory Visit: Payer: Self-pay | Admitting: Family Medicine

## 2010-06-06 LAB — HM DIABETES FOOT EXAM: HM Diabetic Foot Exam: NORMAL

## 2010-06-06 LAB — CONVERTED CEMR LAB: Blood Glucose, Fingerstick: 111

## 2010-06-14 LAB — CONVERTED CEMR LAB
ALT: 35 units/L (ref 0–35)
AST: 26 units/L (ref 0–37)
Albumin: 3.9 g/dL (ref 3.5–5.2)
Alkaline Phosphatase: 109 units/L (ref 39–117)
BUN: 11 mg/dL (ref 6–23)
Bilirubin, Direct: 0.1 mg/dL (ref 0.0–0.3)
CO2: 31 meq/L (ref 19–32)
Calcium: 9.6 mg/dL (ref 8.4–10.5)
Chloride: 97 meq/L (ref 96–112)
Cholesterol: 160 mg/dL (ref 0–200)
Creatinine, Ser: 0.6 mg/dL (ref 0.4–1.2)
Creatinine,U: 42.5 mg/dL
GFR calc non Af Amer: 112.63 mL/min (ref >60.00)
Glucose, Bld: 123 mg/dL — ABNORMAL HIGH (ref 70–99)
HDL: 58.2 mg/dL (ref >39.00)
Hgb A1c MFr Bld: 6.9 % — ABNORMAL HIGH (ref 4.6–6.5)
LDL Cholesterol: 87 mg/dL (ref 0–99)
Microalb Creat Ratio: 2.6 mg/g (ref 0.0–30.0)
Microalb, Ur: 1.1 mg/dL (ref 0.0–1.9)
Potassium: 4.1 meq/L (ref 3.5–5.1)
Sodium: 137 meq/L (ref 135–145)
Total Bilirubin: 0.3 mg/dL (ref 0.3–1.2)
Total CHOL/HDL Ratio: 3
Total Protein: 7 g/dL (ref 6.0–8.3)
Triglycerides: 73 mg/dL (ref 0.0–149.0)
VLDL: 14.6 mg/dL (ref 0.0–40.0)

## 2010-06-23 ENCOUNTER — Telehealth: Payer: Self-pay | Admitting: Family Medicine

## 2010-07-17 ENCOUNTER — Encounter: Payer: Self-pay | Admitting: Family Medicine

## 2010-07-17 ENCOUNTER — Encounter: Payer: Self-pay | Admitting: Orthopedic Surgery

## 2010-07-21 ENCOUNTER — Telehealth (INDEPENDENT_AMBULATORY_CARE_PROVIDER_SITE_OTHER): Payer: Self-pay | Admitting: *Deleted

## 2010-07-26 NOTE — Miscellaneous (Signed)
Summary: Orders Update  Clinical Lists Changes  Orders: Added new Referral order of Radiology Referral (Radiology) - Signed Added new Referral order of ENT Referral (ENT) - Signed

## 2010-07-26 NOTE — Letter (Signed)
Summary: [facility]   Imported By: Freddy Jaksch 05/28/2007 11:59:40  _____________________________________________________________________  External Attachment:    Type:   Image     Comment:   External Document

## 2010-07-26 NOTE — Progress Notes (Signed)
Summary: [facility]--RX  Phone Note Refill Request   Refills Requested: Medication #1:  PROTONIX 40 MG  TBEC take one tablet [facility]--[phone] need direction clarify  Initial call taken by: Freddy Jaksch,  October 15, 2008 10:55 AM    New/Updated Medications: PROTONIX 40 MG  TBEC (PANTOPRAZOLE SODIUM) take one tablet once daily   Prescriptions: PROTONIX 40 MG  TBEC (PANTOPRAZOLE SODIUM) take one tablet once daily  #90 x 0   Entered by:   Jeremy Johann CMA   Authorized by:   Loreen Freud DO   Signed by:   Jeremy Johann CMA on 10/16/2008   Method used:   Re-Faxed to ...       [facility]* (mail-order)             ,          Ph: [phone]       Fax: [phone]   RxID:   [id number]

## 2010-07-26 NOTE — Consult Note (Signed)
Summary: [facility]    Imported By: Lanelle Bal 04/02/2008 11:21:27  _____________________________________________________________________  External Attachment:    Type:   Image     Comment:   External Document

## 2010-07-26 NOTE — Letter (Signed)
Summary: Results Follow up Letter  [facility] [place]  [address] [place], [place] [address]   Phone: [phone]  Fax: [phone]    01/14/2007 MRN: [id number]  Alyssa Ford [address] [place], [place]  [address]  Dear Ms. Coles,  The following are the results of your recent test(s):  Test         Result    Pap Smear:        Normal _____  Not Normal _____ Comments: ______________________________________________________ Cholesterol: LDL(Bad cholesterol):         Your goal is less than:         HDL (Good cholesterol):       Your goal is more than: Comments:  ______________________________________________________ Mammogram:        Normal _X____  Not Normal _____ Comments:  ___________________________________________________________________ Hemoccult:        Normal _____  Not normal _______ Comments:    _____________________________________________________________________ Other Tests:    We routinely do not discuss normal results over the telephone.  If you desire a copy of the results, or you have any questions about this information we can discuss them at your next office visit.   Sincerely,

## 2010-07-26 NOTE — Consult Note (Signed)
Summary: [facility]   Imported By: Esmeralda Links D'jimraou 01/15/2008 12:58:45  _____________________________________________________________________  External Attachment:    Type:   Image     Comment:   External Document

## 2010-07-26 NOTE — Progress Notes (Signed)
Summary: REFILL AMITRIPTYLINE 25MG   Phone Note Call from Patient Call back at Work Phone [phone]   Caller: Patient Call For: Loreen Freud DO Reason for Call: Refill Medication Summary of Call: PATIENT CALLING, REQUESTS RX BE SENT TO [facility] FOR AMITRIPTYLINE 25MG .  HER LAST OV WAS 11-03-08.  PT SCH'D FOR LAB WORK ON 07-14-2009. Initial call taken by: Magdalen Spatz [facility],  July 07, 2009 4:42 PM    Prescriptions: AMITRIPTYLINE HCL 25 MG TABS (AMITRIPTYLINE HCL) TAKE TWO TABLETS DAILY  #180 x 0   Entered by:   Army Fossa CMA   Authorized by:   Loreen Freud DO   Signed by:   Army Fossa CMA on 07/07/2009   Method used:   Faxed to ...       [facility]* (mail-order)             ,          Ph: [phone]       Fax: [phone]   RxID:   [id number]

## 2010-07-26 NOTE — Letter (Signed)
Summary: Results Follow-up Letter  [place] [facility]  [address] [place], [place] [address]   Phone: [phone]  Fax: [phone]    11/06/2008        Shaniyah Wix [address] [place], [place]  [address]  Dear Ms. Picotte,   The following are the results of your recent test(s):  Test     Result     Pap Smear    Normal__XXXX_____  Not Normal_____       Comments: _________________________________________________________ Cholesterol LDL(Bad cholesterol):          Your goal is less than:         HDL (Good cholesterol):        Your goal is more than: _________________________________________________________ Other Tests:   _________________________________________________________  Please call for an appointment Or _________________________________________________________ _________________________________________________________ _________________________________________________________  Sincerely,  Ardyth Man [place] at [facility]

## 2010-07-26 NOTE — Assessment & Plan Note (Signed)
Summary: CPX/FASTING//KN   Vital Signs:  Patient profile:   60 year old female Menstrual status:  postmenopausal Height:      61 inches Weight:      170.6 pounds BMI:     32.35 Temp:     98.5 degrees F oral Pulse rate:   66 / minute Pulse rhythm:   regular BP sitting:   142 / 78  (right arm)  Vitals Entered By: Almeta Monas CMA Duncan Dull) (February 23, 2010 9:48 AM)  CC: CPX/Fasting, Pt needs pap Comments pt is taking prozac but unsure of dosage     Menstrual Status postmenopausal Last PAP Result NEGATIVE FOR INTRAEPITHELIAL LESIONS OR MALIGNANCY.   History of Present Illness: Pt here for cpe , labs and pap. Pt had poison ivy but was seen at [facility] and derm so it is resolving.    Hyperlipidemia follow-up      This is a 60 year old woman who presents for Hyperlipidemia follow-up.  The patient denies muscle aches, GI upset, abdominal pain, flushing, itching, constipation, diarrhea, and fatigue.  The patient denies the following symptoms: chest pain/pressure, exercise intolerance, dypsnea, palpitations, syncope, and pedal edema.  Compliance with medications (by patient report) has been near 100%.  Dietary compliance has been good.  The patient reports exercising occasionally.  Adjunctive measures currently used by the patient include ASA.    Hypertension follow-up      The patient also presents for Hypertension follow-up.  The patient denies lightheadedness, urinary frequency, headaches, edema, impotence, rash, and fatigue.  The patient denies the following associated symptoms: chest pain, chest pressure, exercise intolerance, dyspnea, palpitations, syncope, leg edema, and pedal edema.  Compliance with medications (by patient report) has been near 100%.  The patient reports that dietary compliance has been good.  The patient reports exercising occasionally.  Adjunctive measures currently used by the patient include salt restriction.    Type 1 diabetes mellitus follow-up      The patient is  also here for Type 2 diabetes mellitus follow-up.  The patient denies polyuria, polydipsia, blurred vision, self managed hypoglycemia, hypoglycemia requiring help, weight loss, weight gain, and numbness of extremities.  The patient denies the following symptoms: neuropathic pain, chest pain, vomiting, orthostatic symptoms, poor wound healing, intermittent claudication, vision loss, and foot ulcer.  Since the last visit the patient reports good dietary compliance, compliance with medications, and monitoring blood glucose.  The patient has been measuring capillary blood glucose before breakfast and before dinner.  Since the last visit, the patient reports having had eye care by an ophthalmologist and no foot care.    Preventive Screening-Counseling & Management  Alcohol-Tobacco     Alcohol drinks/day: <1     Alcohol type: beer     >5/day in last 3 mos: no     Alcohol Counseling: not indicated; use of alcohol is not excessive or problematic     Smoking Status: current     Smoking Cessation Counseling: yes     Smoke Cessation Stage: ready     Packs/Day: 0.5     Year Started: 1968     Passive Smoke Exposure: yes  Caffeine-Diet-Exercise     Caffeine use/day: 5     Does Patient Exercise: yes     Type of exercise: walking     Exercise (avg: min/session): <30     Times/week: <3     Exercise Counseling: to improve exercise regimen  Hep-HIV-STD-Contraception     Hepatitis Risk: no risk noted  HIV Risk: no risk noted     HIV Risk Counseling: not indicated-no HIV risk noted     STD Risk: no risk noted     STD Risk Counseling: not indicated-no STD risk noted     Dental Visit-last 6 months yes     Dental Care Counseling: not indicated; dental care within six months     SBE monthly: yes     SBE Education/Counseling: not indicated; SBE done regularly     Sun Exposure-Excessive: no  Safety-Violence-Falls     Seat Belt Use: yes     Firearms in the Home: firearms in the home     Firearm  Counseling: not indicated; uses recommended firearm safety measures     Smoke Detectors: yes     Smoke Detector Counseling: no     Violence in the Home: no risk noted     Sexual Abuse: no      Sexual History:  currently monogamous.        Drug Use:  never.        Blood Transfusions:  no.    Current Medications (verified): 1)  Actoplus Met 15-850 Mg Tabs (Pioglitazone Hcl-Metformin Hcl) .... Take 2 Tabs By Mouth in Am and 1 By Mouth Q Dinner 2)  Amitriptyline Hcl 25 Mg Tabs (Amitriptyline Hcl) .... Take Two Tablets Daily 3)  Hydrochlorothiazide 25 Mg  Tabs (Hydrochlorothiazide) .... Take One Tab Every Day 4)  Omeprazole 20 Mg Cpdr (Omeprazole) .Marland Kitchen.. 1 By Mouth Once Daily 5)  Diazepam 5 Mg  Tabs (Diazepam) .... As Needed At Bedtime 6)  Lipitor 80 Mg  Tabs (Atorvastatin Calcium) .... Take One Tablet Daily 7)  Hydrochlorothiazide 25 Mg  Tabs (Hydrochlorothiazide) .... Take One Tablet Daily 8)  Naproxen 500 Mg  Tabs (Naproxen) .... Take One Tablet Twice Daily 9)  Trilipix 135 Mg Cpdr (Choline Fenofibrate) .... Take One By Mouth Every Day. 10)  Vitamin D 29528 Unit Caps (Ergocalciferol) .... Take One Capsule Weekly  Allergies (verified): No Known Drug Allergies  Past History:  Past Surgical History: Last updated: 11/03/2008 Cholecystectomy Carpal tunnel release (2006) Carpal tunnel release (12/24/2007---  Kuzma Cervical fusion (3/08)--Jenkins  Family History: Last updated: 02/23/2010 Family History of CAD Female 1st degree relative <50 Family History Breast Cancer--M 61yo Family History Diabetes 1st degree relative--M and F M--Dementia father==parkinsons dz  Social History: Last updated: 02/23/2010  unemployed Current Smoker Alcohol use-yes Drug use-no Married Regular exercise-yes  Risk Factors: Alcohol Use: <1 (02/23/2010) >5 drinks/d w/in last 3 months: no (02/23/2010) Caffeine Use: 5 (02/23/2010) Exercise: yes (02/23/2010)  Risk Factors: Smoking Status: current  (02/23/2010) Packs/Day: 0.5 (02/23/2010) Passive Smoke Exposure: yes (02/23/2010)  Past Medical History: GERD Hyperlipidemia Hypertension Diabetes mellitus, type II thyroid nodule Current Problems:  UNSPECIFIED CATARACT (ICD-366.9) POSTMENOPAUSAL STATUS (ICD-627.2) PREVENTIVE HEALTH CARE (ICD-V70.0) FAMILY HISTORY DIABETES 1ST DEGREE RELATIVE (ICD-V18.0) FATIGUE (ICD-780.79) DIABETES MELLITUS, TYPE II (ICD-250.00) FAMILY HISTORY OF CAD FEMALE 1ST DEGREE RELATIVE <50 (ICD-V17.3) DM (ICD-250.00) GLUCOMA (ICD-365.9) THYROID NODULE (ICD-241.0) HYPERTENSION (ICD-401.9) HYPERLIPIDEMIA (ICD-272.4) GERD (ICD-530.81)  Family History: Reviewed history from 11/03/2008 and no changes required. Family History of CAD Female 1st degree relative <50 Family History Breast Cancer--M 61yo Family History Diabetes 1st degree relative--M and F M--Dementia father==parkinsons dz  Social History: Reviewed history from 11/03/2008 and no changes required.  unemployed Current Smoker Alcohol use-yes Drug use-no Married Regular exercise-yes  Review of Systems      See HPI General:  Denies chills, fatigue, fever, loss of appetite, malaise, sleep  disorder, sweats, weakness, and weight loss. Eyes:  Denies blurring, discharge, double vision, eye irritation, eye pain, halos, itching, light sensitivity, red eye, vision loss-1 eye, and vision loss-both eyes. ENT:  Denies decreased hearing, difficulty swallowing, ear discharge, earache, hoarseness, nasal congestion, nosebleeds, postnasal drainage, ringing in ears, sinus pressure, and sore throat. CV:  Denies bluish discoloration of lips or nails, chest pain or discomfort, difficulty breathing at night, difficulty breathing while lying down, fainting, fatigue, leg cramps with exertion, lightheadness, near fainting, palpitations, shortness of breath with exertion, swelling of feet, swelling of hands, and weight gain. Resp:  Denies chest discomfort, chest pain  with inspiration, cough, coughing up blood, excessive snoring, hypersomnolence, morning headaches, pleuritic, shortness of breath, sputum productive, and wheezing. GI:  Denies abdominal pain, bloody stools, change in bowel habits, constipation, dark tarry stools, diarrhea, excessive appetite, gas, hemorrhoids, indigestion, loss of appetite, nausea, vomiting, vomiting blood, and yellowish skin color. GU:  Denies abnormal vaginal bleeding, decreased libido, discharge, dysuria, genital sores, hematuria, incontinence, nocturia, urinary frequency, and urinary hesitancy. MS:  Denies joint pain, joint redness, joint swelling, loss of strength, low back pain, mid back pain, muscle aches, muscle , cramps, muscle weakness, stiffness, and thoracic pain. Derm:  Denies changes in color of skin, changes in nail beds, dryness, excessive perspiration, flushing, hair loss, insect bite(s), itching, lesion(s), poor wound healing, and rash; derm--q1y or more often. Neuro:  Denies brief paralysis, difficulty with concentration, disturbances in coordination, falling down, headaches, inability to speak, memory loss, numbness, poor balance, seizures, sensation of room spinning, tingling, tremors, visual disturbances, and weakness. Psych:  Denies alternate hallucination ( auditory/visual), anxiety, depression, easily angered, easily tearful, irritability, mental problems, panic attacks, sense of great danger, suicidal thoughts/plans, thoughts of violence, unusual visions or sounds, and thoughts /plans of harming others. Endo:  Denies cold intolerance, excessive hunger, excessive thirst, excessive urination, heat intolerance, polyuria, and weight change. Heme:  Denies abnormal bruising, bleeding, enlarge lymph nodes, fevers, pallor, and skin discoloration. Allergy:  Denies hives or rash, itching eyes, persistent infections, seasonal allergies, and sneezing.  Physical Exam  General:  Well-developed,well-nourished,in no acute  distress; alert,appropriate and cooperative throughout examination Head:  Normocephalic and atraumatic without obvious abnormalities. No apparent alopecia or balding. Eyes:  pupils equal, pupils round, and pupils reactive to light.   Ears:  External ear exam shows no significant lesions or deformities.  Otoscopic examination reveals clear canals, tympanic membranes are intact bilaterally without bulging, retraction, inflammation or discharge. Hearing is grossly normal bilaterally. Nose:  External nasal examination shows no deformity or inflammation. Nasal mucosa are pink and moist without lesions or exudates. Mouth:  Oral mucosa and oropharynx without lesions or exudates.  Teeth in good repair. Neck:  No deformities, masses, or tenderness noted.no carotid bruits.   Chest Wall:  No deformities, masses, or tenderness noted. Breasts:  No mass, nodules, thickening, tenderness, bulging, retraction, inflamation, nipple discharge or skin changes noted.   Lungs:  Normal respiratory effort, chest expands symmetrically. Lungs are clear to auscultation, no crackles or wheezes. Heart:  normal rate and no murmur.   Abdomen:  Bowel sounds positive,abdomen soft and non-tender without masses, organomegaly or hernias noted. Rectal:  No external abnormalities noted. Normal sphincter tone. No rectal masses or tenderness.  heme neg brown stool Genitalia:  Pelvic Exam:        External: normal female genitalia without lesions or masses        Vagina: normal without lesions or masses  Cervix: normal without lesions or masses        Adnexa: normal bimanual exam without masses or fullness        Uterus: normal by palpation        Pap smear: performed Msk:  normal ROM, no joint tenderness, no joint swelling, no joint warmth, no redness over joints, no joint deformities, no joint instability, and no crepitation.   Pulses:  R posterior tibial normal, R dorsalis pedis normal, R carotid normal, L posterior tibial  normal, L dorsalis pedis normal, and L carotid normal.   Extremities:  No clubbing, cyanosis, edema, or deformity noted with normal full range of motion of all joints.   Neurologic:  No cranial nerve deficits noted. Station and gait are normal. Plantar reflexes are down-going bilaterally. DTRs are symmetrical throughout. Sensory, motor and coordinative functions appear intact. Skin:  Intact without suspicious lesions or rashes Cervical Nodes:  No lymphadenopathy noted Axillary Nodes:  No palpable lymphadenopathy Psych:  Cognition and judgment appear intact. Alert and cooperative with normal attention span and concentration. No apparent delusions, illusions, hallucinations  Diabetes Management Exam:    Foot Exam (with socks and/or shoes not present):       Sensory-Pinprick/Light touch:          Left medial foot (L-4): normal          Left dorsal foot (L-5): normal          Left lateral foot (S-1): normal          Right medial foot (L-4): normal          Right dorsal foot (L-5): normal          Right lateral foot (S-1): normal       Sensory-Monofilament:          Left foot: normal          Right foot: normal       Inspection:          Left foot: normal          Right foot: normal       Nails:          Left foot: normal          Right foot: normal    Eye Exam:       Eye Exam done elsewhere          Date: 11/10/2009          Results: cataract, glaucoma          Done by: digby   Impression & Recommendations:  Problem # 1:  PREVENTIVE HEALTH CARE (ICD-V70.0) ghm utd Orders: Venipuncture (16109) TLB-Lipid Panel (80061-LIPID) TLB-BMP (Basic Metabolic Panel-BMET) (80048-METABOL) TLB-CBC Platelet - w/Differential (85025-CBCD) TLB-Hepatic/Liver Function Pnl (80076-HEPATIC) TLB-TSH (Thyroid Stimulating Hormone) (84443-TSH) TLB-A1C / Hgb A1C (Glycohemoglobin) (83036-A1C) TLB-Microalbumin/Creat Ratio, Urine (82043-MALB) TLB-T3, Free (Triiodothyronine) (84481-T3FREE) TLB-T4 (Thyrox),  Free (60454-UJ8J) EKG w/ Interpretation (93000) Specimen Handling (19147)  Problem # 2:  POSTMENOPAUSAL STATUS (ICD-627.2)  Orders: Venipuncture (82956) TLB-Lipid Panel (80061-LIPID) TLB-BMP (Basic Metabolic Panel-BMET) (80048-METABOL) TLB-CBC Platelet - w/Differential (85025-CBCD) TLB-Hepatic/Liver Function Pnl (80076-HEPATIC) TLB-TSH (Thyroid Stimulating Hormone) (84443-TSH) TLB-A1C / Hgb A1C (Glycohemoglobin) (83036-A1C) TLB-Microalbumin/Creat Ratio, Urine (82043-MALB) TLB-T3, Free (Triiodothyronine) (84481-T3FREE) TLB-T4 (Thyrox), Free (21308-MV7Q) EKG w/ Interpretation (93000) T-Vitamin D (25-Hydroxy) (46962-95284) Specimen Handling (13244)  Discussed treatment options.   Problem # 3:  DIABETES MELLITUS, TYPE II (ICD-250.00)  Her updated medication list for this problem includes:    Actoplus Met 15-850 Mg Tabs (Pioglitazone  hcl-metformin hcl) .Marland Kitchen... Take 2 tabs by mouth in am and 1 by mouth q dinner  Orders: Venipuncture (73220) TLB-Lipid Panel (80061-LIPID) TLB-BMP (Basic Metabolic Panel-BMET) (80048-METABOL) TLB-CBC Platelet - w/Differential (85025-CBCD) TLB-Hepatic/Liver Function Pnl (80076-HEPATIC) TLB-TSH (Thyroid Stimulating Hormone) (84443-TSH) TLB-A1C / Hgb A1C (Glycohemoglobin) (83036-A1C) TLB-Microalbumin/Creat Ratio, Urine (82043-MALB) TLB-T3, Free (Triiodothyronine) (84481-T3FREE) TLB-T4 (Thyrox), Free [phone] EKG w/ Interpretation (93000) Specimen Handling (76283)  Labs Reviewed: Creat: 0.7 (07/14/2009)     Last Eye Exam: cataract, glaucoma (11/10/2009) Reviewed HgBA1c results: 6.9 (07/14/2009)  6.8 (11/03/2008)  Problem # 4:  THYROID NODULE (ICD-241.0)  Orders: Venipuncture (15176) TLB-Lipid Panel (80061-LIPID) TLB-BMP (Basic Metabolic Panel-BMET) (80048-METABOL) TLB-CBC Platelet - w/Differential (85025-CBCD) TLB-Hepatic/Liver Function Pnl (80076-HEPATIC) TLB-TSH (Thyroid Stimulating Hormone) (84443-TSH) TLB-A1C / Hgb A1C  (Glycohemoglobin) (83036-A1C) TLB-Microalbumin/Creat Ratio, Urine (82043-MALB) TLB-T3, Free (Triiodothyronine) (84481-T3FREE) TLB-T4 (Thyrox), Free (16073-XT0G) EKG w/ Interpretation (93000) Specimen Handling (26948)  Problem # 5:  HYPERTENSION (ICD-401.9)  Her updated medication list for this problem includes:    Hydrochlorothiazide 25 Mg Tabs (Hydrochlorothiazide) .Marland Kitchen... Take one tab every day    Hydrochlorothiazide 25 Mg Tabs (Hydrochlorothiazide) .Marland Kitchen... Take one tablet daily  Orders: Venipuncture (54627) TLB-Lipid Panel (80061-LIPID) TLB-BMP (Basic Metabolic Panel-BMET) (80048-METABOL) TLB-CBC Platelet - w/Differential (85025-CBCD) TLB-Hepatic/Liver Function Pnl (80076-HEPATIC) TLB-TSH (Thyroid Stimulating Hormone) (84443-TSH) TLB-A1C / Hgb A1C (Glycohemoglobin) (83036-A1C) TLB-Microalbumin/Creat Ratio, Urine (82043-MALB) TLB-T3, Free (Triiodothyronine) (84481-T3FREE) TLB-T4 (Thyrox), Free (03500-XF8H) EKG w/ Interpretation (93000) Specimen Handling (82993)  BP today: 142/78 Prior BP: 114/78 (11/03/2008)  Labs Reviewed: K+: 3.8 (07/14/2009) Creat: : 0.7 (07/14/2009)   Chol: 177 (07/14/2009)   HDL: 43.00 (07/14/2009)   LDL: 105 (07/14/2009)   TG: 147.0 (07/14/2009)  Problem # 6:  HYPERLIPIDEMIA (ICD-272.4)  Her updated medication list for this problem includes:    Lipitor 80 Mg Tabs (Atorvastatin calcium) .Marland Kitchen... Take one tablet daily    Trilipix 135 Mg Cpdr (Choline fenofibrate) .Marland Kitchen... Take one by mouth every day.  Orders: Venipuncture (71696) TLB-Lipid Panel (80061-LIPID) TLB-BMP (Basic Metabolic Panel-BMET) (80048-METABOL) TLB-CBC Platelet - w/Differential (85025-CBCD) TLB-Hepatic/Liver Function Pnl (80076-HEPATIC) TLB-TSH (Thyroid Stimulating Hormone) (84443-TSH) TLB-A1C / Hgb A1C (Glycohemoglobin) (83036-A1C) TLB-Microalbumin/Creat Ratio, Urine (82043-MALB) TLB-T3, Free (Triiodothyronine) (84481-T3FREE) TLB-T4 (Thyrox), Free [phone]) EKG w/ Interpretation  (93000) Specimen Handling (51025)  Labs Reviewed: SGOT: 30 (07/14/2009)   SGPT: 35 (07/14/2009)   HDL:43.00 (07/14/2009), 38.40 (11/03/2008)  LDL:105 (07/14/2009), 99 (85/27/7824)  Chol:177 (07/14/2009), 215 (11/03/2008)  Trig:147.0 (07/14/2009), 151.0 (11/03/2008)  Problem # 7:  GERD (ICD-530.81)  Her updated medication list for this problem includes:    Omeprazole 20 Mg Cpdr (Omeprazole) .Marland Kitchen... 1 by mouth once daily  Diagnostics Reviewed:  Discussed lifestyle modifications, diet, antacids/medications, and preventive measures. Handout provided.   Complete Medication List: 1)  Actoplus Met 15-850 Mg Tabs (Pioglitazone hcl-metformin hcl) .... Take 2 tabs by mouth in am and 1 by mouth q dinner 2)  Amitriptyline Hcl 25 Mg Tabs (Amitriptyline hcl) .... Take two tablets daily 3)  Hydrochlorothiazide 25 Mg Tabs (Hydrochlorothiazide) .... Take one tab every day 4)  Omeprazole 20 Mg Cpdr (Omeprazole) .Marland Kitchen.. 1 by mouth once daily 5)  Diazepam 5 Mg Tabs (Diazepam) .... As needed at bedtime 6)  Lipitor 80 Mg Tabs (Atorvastatin calcium) .... Take one tablet daily 7)  Hydrochlorothiazide 25 Mg Tabs (Hydrochlorothiazide) .... Take one tablet daily 8)  Naproxen 500 Mg Tabs (Naproxen) .... Take one tablet twice daily 9)  Trilipix 135 Mg Cpdr (Choline fenofibrate) .... Take one by mouth every day. 10)  Vitamin D 23536 Unit Caps (Ergocalciferol) .Marland KitchenMarland KitchenMarland Kitchen  Take one capsule weekly  Other Orders: Tdap => 42yrs IM (04540) Admin 1st Vaccine (98119)  Patient Instructions: 1)  Please schedule a follow-up appointment in 6 months .  Prescriptions: LIPITOR 80 MG  TABS (ATORVASTATIN CALCIUM) take one tablet daily  #30 x 5   Entered and Authorized by:   Loreen Freud DO   Signed by:   Loreen Freud DO on 02/23/2010   Method used:   Electronically to        [facility] (retail)       [address]       [place], [place]  [address]       Ph: [phone]       Fax: [phone]    RxID:   [id number] OMEPRAZOLE 20 MG CPDR (OMEPRAZOLE) 1 by mouth once daily  #90 x 3   Entered and Authorized by:   Loreen Freud DO   Signed by:   Loreen Freud DO on 02/23/2010   Method used:   Electronically to        [facility].* (retail)       [address]       [place], [place]  [address]       Ph: [phone]       Fax: [phone]   RxID:   [id number]    EKG  Procedure date:  02/23/2010  Findings:      Normal sinus rhythm with rate of:  94 bpm Non-specific ST-T wave changes noted.           Immunizations Administered:  Tetanus Vaccine:    Vaccine Type: Tdap    Site: right deltoid    Mfr: Mer[employer]    Dose: 0.5 ml    Route: IM    Given by: Almeta Monas CMA (AAMA)    Exp. Date: 03/16/2012    Lot #: [id number]    VIS given: 05/14/07 version given February 23, 2010.

## 2010-07-26 NOTE — Assessment & Plan Note (Signed)
Summary: CPX & LAB/CBS   Vital Signs:  Patient profile:   60 year old female Height:      61 inches Weight:      168 pounds BMI:     31.86 Temp:     97.9 degrees F oral Pulse rate:   72 / minute Resp:     18 per minute BP sitting:   114 / 78  (right arm)  Vitals Entered By: Ardyth Man (Nov 03, 2008 8:58 AM) CC: CPX-Fasting Labs- Is Patient Diabetic? Yes  Pain Assessment Patient in pain? no       Have you ever been in a relationship where you felt threatened, hurt or afraid?No   Does patient need assistance? Functional Status Self care Ambulation Normal   History of Present Illness: Pt here for CPE, labs and pap.  No complaints.  Type 1 diabetes mellitus follow-up      This is a 60 year old woman who presents with Type 2 diabetes mellitus follow-up.  The patient denies polyuria, polydipsia, blurred vision, self managed hypoglycemia, hypoglycemia requiring help, weight loss, weight gain, and numbness of extremities.  The patient denies the following symptoms: neuropathic pain, chest pain, vomiting, orthostatic symptoms, poor wound healing, intermittent claudication, vision loss, and foot ulcer.  Since the last visit the patient reports good dietary compliance, compliance with medications, exercising regularly, and monitoring blood glucose.  The patient has been measuring capillary blood glucose before breakfast and before dinner.  Since the last visit, the patient reports having had eye care by an ophthalmologist and no foot care.    Hyperlipidemia follow-up      The patient also presents for Hyperlipidemia follow-up.  The patient denies muscle aches, GI upset, abdominal pain, flushing, itching, constipation, diarrhea, and fatigue.  The patient denies the following symptoms: chest pain/pressure, exercise intolerance, dypsnea, palpitations, syncope, and pedal edema.  Compliance with medications (by patient report) has been near 100%.  Dietary compliance has been good.  The  patient reports exercising occasionally.  Adjunctive measures currently used by the patient include ASA, limiting alcohol consumpton, and weight reduction.    Hypertension follow-up      The patient also presents for Hypertension follow-up.  The patient denies lightheadedness, urinary frequency, headaches, edema, impotence, rash, and fatigue.  The patient denies the following associated symptoms: chest pain, chest pressure, exercise intolerance, dyspnea, palpitations, syncope, leg edema, and pedal edema.  Compliance with medications (by patient report) has been near 100%.  The patient reports that dietary compliance has been good.  The patient reports exercising occasionally.  Adjunctive measures currently used by the patient include salt restriction.    Preventive Screening-Counseling & Management     Alcohol drinks/day: <1     Alcohol type: beer     >5/day in last 3 mos: no     Alcohol Counseling: not indicated; use of alcohol is not excessive or problematic     Smoking Status: current     Smoking Cessation Counseling: yes     Smoke Cessation Stage: ready     Packs/Day: 0.5     Year Started: 1968     Caffeine use/day: 5     Does Patient Exercise: yes     Type of exercise: walking     Exercise (avg: min/session): <30     Times/week: 4     Exercise Counseling: to improve exercise regimen     Hepatitis Risk: no risk noted     HIV Risk: no risk noted  HIV Risk Counseling: not indicated-no HIV risk noted     STD Risk: no risk noted     STD Risk Counseling: not indicated-no STD risk noted     Dental Visit-last 6 months yes     Dental Care Counseling: not indicated; dental care within six months     SBE monthly: yes     SBE Education/Counseling: not indicated; SBE done regularly     Sun Exposure-Excessive: no     Seat Belt Use: yes     Firearms in the Home: firearms in the home     Firearm Counseling: not indicated; uses recommended firearm safety measures     Smoke Detectors: yes      Smoke Detector Counseling: no     Violence in the Home: no risk noted     Sexual Abuse: no      Sexual History:  currently monogamous.        Drug Use:  never.        Blood Transfusions:  no.    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 Take One Tablet By Mouth Twice Daily 2)  Amitriptyline Hcl 25 Mg Tabs (Amitriptyline Hcl) .... Take Two Tablets Daily 3)  Hydrochlorothiazide 25 Mg  Tabs (Hydrochlorothiazide) .... Take One Tab Every Day 4)  Protonix 40 Mg  Tbec (Pantoprazole Sodium) .... Take One Tablet Once Daily 5)  Diazepam 5 Mg  Tabs (Diazepam) .... As Needed At Bedtime 6)  Antara 130 Mg  Caps (Fenofibrate Micronized) .... Take One Tablet Daily 7)  Lipitor 80 Mg  Tabs (Atorvastatin Calcium) .... Take One Tablet Daily 8)  Hydrochlorothiazide 25 Mg  Tabs (Hydrochlorothiazide) .... Take One Tablet Daily 9)  Naproxen 500 Mg  Tabs (Naproxen) .... Take One Tablet Twice Daily 10)  Vitamin D 18841 Unit  Caps (Ergocalciferol) .... Take One Capsule Weekly 11)  Januvia 100 Mg  Tabs (Sitagliptin Phosphate) .... Take One Tablet Daily.  Current Medications (verified): 1)  Actoplus Met 15-850 Mg Tabs (Pioglitazone Hcl-Metformin Hcl) .... Take One Tablet By Mouth Twice Daily 2)  Amitriptyline Hcl 25 Mg Tabs (Amitriptyline Hcl) .... Take Two Tablets Daily 3)  Hydrochlorothiazide 25 Mg  Tabs (Hydrochlorothiazide) .... Take One Tab Every Day 4)  Protonix 40 Mg  Tbec  (Pantoprazole Sodium) .... Take One Tablet Once Daily 5)  Diazepam 5 Mg  Tabs (Diazepam) .... As Needed At Bedtime 6)  Lipitor 80 Mg  Tabs (Atorvastatin Calcium) .... Take One Tablet Daily 7)  Hydrochlorothiazide 25 Mg  Tabs (Hydrochlorothiazide) .... Take One Tablet Daily 8)  Naproxen 500 Mg  Tabs (Naproxen) .... Take One Tablet Twice Daily  Allergies (verified): No Known Drug Allergies  Past History:  Social History:    Occupation:  [employer]    Current Smoker    Alcohol use-yes    Drug use-no    Married    Regular exercise-yes     (11/03/2008)  Risk Factors:    Alcohol  Use: <1 (11/03/2008)    >5 drinks/d w/in last 3 months: no (11/03/2008)    Caffeine Use: 5 (11/03/2008)    Diet: N/A    Exercise: yes (11/03/2008)  Risk Factors:    Smoking Status: current (11/03/2008)    Packs/Day: 0.5 (11/03/2008)    Cigars/wk: N/A    Pipe Use/wk: N/A    Cans of tobacco/wk: N/A    Passive Smoke Exposure: yes (12/12/2007)  Past medical, surgical, family and social histories (including risk factors) reviewed, and no changes noted (except as noted below).  Past Medical History:    Reviewed history from 09/11/2007 and no changes required:    GERD    Hyperlipidemia    Hypertension    Diabetes mellitus, type II    thyroid nodule  Past Surgical History:    Cholecystectomy    Carpal tunnel release (2006)    Carpal tunnel release (12/24/2007---  Kuzma    Cervical fusion (3/08)--Jenkins  Family History:    Reviewed history from 12/12/2007 and no changes required:       Family History of CAD Female 1st degree relative <50       Family History Breast Cancer--M 61yo       Family History Diabetes 1st degree relative--M and F       M--Dementia  Social History:    Reviewed history from 09/11/2007 and no changes required:       Occupation:  [employer]       Current Smoker       Alcohol use-yes       Drug use-no       Married       Regular exercise-yes    Caffeine use/day:  5    Packs/Day:  0.5     Does Patient Exercise:  yes    Hepatitis Risk:  no risk noted    HIV Risk:  no risk noted    STD Risk:  no risk noted    Dental Care w/in 6 mos.:  yes    Sun Exposure-Excessive:  no    Seat Belt Use:  yes    Sexual History:  currently monogamous    Drug Use:  never    Blood Transfusions:  no  Review of Systems      See HPI General:  Denies chills, fatigue, fever, loss of appetite, malaise, sleep disorder, sweats, weakness, and weight loss. Eyes:  Denies blurring, discharge, double vision, eye irritation, eye pain, halos, itching, light sensitivity, red eye, vision loss-1 eye, and vision loss-both eyes; optho--q3m--glaucoma. ENT:  Denies decreased hearing, difficulty swallowing, ear discharge, earache, hoarseness, nasal congestion, nosebleeds, postnasal drainage, ringing in ears, sinus pressure, and sore throat. CV:  Denies bluish discoloration of lips or nails, chest pain or discomfort, difficulty breathing at night, difficulty breathing while lying down, fainting, fatigue, leg cramps with exertion, lightheadness, near fainting, palpitations, shortness of breath with exertion, swelling of feet, swelling of hands, and weight gain. Resp:  Denies chest discomfort, chest pain with inspiration, cough, coughing up blood, excessive snoring, hypersomnolence, morning headaches, pleuritic, shortness of breath, sputum productive, and wheezing. GI:  Denies abdominal pain, bloody stools, change in bowel habits, constipation, dark tarry stools, diarrhea, excessive appetite, gas, hemorrhoids, indigestion, loss of appetite, nausea, vomiting, vomiting blood, and yellowish skin color. GU:  Denies abnormal vaginal bleeding, decreased libido, discharge, dysuria, genital sores, hematuria, incontinence, nocturia, urinary frequency, and urinary hesitancy. MS:  Denies joint pain, joint redness, joint swelling, loss of strength, low back pain, mid back  pain, muscle aches, muscle , cramps, muscle weakness, stiffness,  and thoracic pain. Derm:  Denies changes in color of skin, changes in nail beds, dryness, excessive perspiration, flushing, hair loss, insect bite(s), itching, lesion(s), poor wound healing, and rash. Neuro:  Denies brief paralysis, difficulty with concentration, disturbances in coordination, falling down, headaches, inability to speak, memory loss, numbness, poor balance, seizures, sensation of room spinning, tingling, tremors, visual disturbances, and weakness. Psych:  Denies alternate hallucination ( auditory/visual), anxiety, depression, easily angered, easily tearful, irritability, mental problems, panic attacks, sense of great danger, suicidal thoughts/plans, thoughts of violence, unusual visions or sounds, and thoughts /plans of harming others. Endo:  Denies cold intolerance, excessive hunger, excessive thirst, excessive urination, heat intolerance, polyuria, and weight change. Heme:  Denies abnormal bruising, bleeding, enlarge lymph nodes, fevers, pallor, and skin discoloration. Allergy:  Denies hives or rash, itching eyes, persistent infections, seasonal allergies, and sneezing.  Physical Exam  General:  Well-developed,well-nourished,in no acute distress; alert,appropriate and cooperative throughout examination Head:  Normocephalic and atraumatic without obvious abnormalities. No apparent alopecia or balding. Eyes:  vision grossly intact, pupils equal, pupils round, pupils reactive to light, and no injection.   Ears:  External ear exam shows no significant lesions or deformities.  Otoscopic examination reveals clear canals, tympanic membranes are intact bilaterally without bulging, retraction, inflammation or discharge. Hearing is grossly normal bilaterally. Nose:  External nasal examination shows no deformity or inflammation. Nasal mucosa are pink and moist without lesions or exudates. Mouth:  Oral mucosa and oropharynx without lesions or exudates.  Teeth in good repair. Neck:  No  deformities, masses, or tenderness noted.no carotid bruits.   Chest Wall:  No deformities, masses, or tenderness noted. Breasts:  No mass, nodules, thickening, tenderness, bulging, retraction, inflamation, nipple discharge or skin changes noted.   Lungs:  Normal respiratory effort, chest expands symmetrically. Lungs are clear to auscultation, no crackles or wheezes. Heart:  Normal rate and regular rhythm. S1 and S2 normal without gallop, murmur, click, rub or other extra sounds. Abdomen:  Bowel sounds positive,abdomen soft and non-tender without masses, organomegaly or hernias noted. Rectal:  No external abnormalities noted. Normal sphincter tone. No rectal masses or tenderness. Genitalia:  Pelvic Exam:        External: normal female genitalia without lesions or masses        Vagina: normal without lesions or masses        Cervix: normal without lesions or masses        Adnexa: normal bimanual exam without masses or fullness        Uterus: normal by palpation        Pap smear: performed Msk:  normal ROM, no joint tenderness, no joint swelling, no joint warmth, no redness over joints, no joint deformities, no joint instability, and no crepitation.   Pulses:  R posterior tibial normal, R dorsalis pedis normal, R carotid normal, L posterior tibial normal, L dorsalis pedis normal, and L carotid normal.   Extremities:  No clubbing, cyanosis, edema, or deformity noted with normal full range of motion of all joints.   Neurologic:  No cranial nerve deficits noted. Station and gait are normal. Plantar reflexes are down-going bilaterally. DTRs are symmetrical throughout. Sensory, motor and coordinative functions appear intact. Skin:  Intact without suspicious lesions or rashes Cervical Nodes:  No lymphadenopathy noted Axillary Nodes:  No palpable lymphadenopathy Psych:  Cognition and judgment appear intact. Alert and cooperative with normal attention span and concentration. No apparent delusions,  illusions, hallucinations  Diabetes Management Exam:    Foot Exam (with socks and/or shoes not present):       Sensory-Pinprick/Light touch:          Left medial foot (L-4): normal          Left dorsal foot (L-5): normal          Left lateral foot (S-1): normal          Right medial foot (L-4): normal          Right dorsal foot (L-5): normal          Right lateral foot (S-1): normal       Sensory-Monofilament:          Left foot: normal          Right foot: normal       Inspection:          Left foot: normal          Right foot: normal       Nails:          Left foot: normal          Right foot: normal    Eye Exam:       Eye Exam done here today   Impression & Recommendations:  Problem # 1:  PREVENTIVE HEALTH CARE (ICD-V70.0) check labs sbe ghm utd  Problem # 2:  DIABETES MELLITUS, TYPE II (ICD-250.00)  The following medications were removed from the medication list:    Januvia 100 Mg Tabs (Sitagliptin phosphate) .Marland Kitchen... Take one tablet daily. Her updated medication list for this problem includes:    Actoplus Met 15-850 Mg Tabs (Pioglitazone hcl-metformin hcl) .Marland Kitchen... Take one tablet by mouth twice daily  Orders: Venipuncture (21308) TLB-BMP (Basic Metabolic Panel-BMET) (80048-METABOL) TLB-Lipid Panel (80061-LIPID) TLB-CBC Platelet - w/Differential (85025-CBCD) TLB-Hepatic/Liver Function Pnl (80076-HEPATIC) TLB-TSH (Thyroid Stimulating Hormone) (84443-TSH) T-Vitamin D (25-Hydroxy) (65784-69629) TLB-A1C / Hgb A1C (Glycohemoglobin) (83036-A1C) TLB-Microalbumin/Creat Ratio, Urine (82043-MALB) EKG w/ Interpretation (93000)  Labs Reviewed: Creat: 0.8 (12/12/2007)    Reviewed HgBA1c results: 6.8 (12/12/2007)  6.5 (09/11/2007)  Problem # 3:  HYPERTENSION (ICD-401.9)  Her updated medication list for this problem includes:    Hydrochlorothiazide 25 Mg Tabs (Hydrochlorothiazide) .Marland Kitchen... Take one tab every day    Hydrochlorothiazide 25 Mg Tabs (Hydrochlorothiazide) .Marland Kitchen...  Take one tablet daily  Orders: Venipuncture (52841) TLB-BMP (Basic Metabolic Panel-BMET) (80048-METABOL) TLB-Lipid Panel (80061-LIPID) TLB-CBC Platelet - w/Differential (85025-CBCD) TLB-Hepatic/Liver Function Pnl (80076-HEPATIC) TLB-TSH (Thyroid Stimulating Hormone) (84443-TSH) T-Vitamin D (25-Hydroxy) (32440-10272) TLB-A1C / Hgb A1C (Glycohemoglobin) (83036-A1C) TLB-Microalbumin/Creat Ratio, Urine (82043-MALB) EKG w/ Interpretation (93000)  Problem # 4:  HYPERLIPIDEMIA (ICD-272.4)  The following medications were removed from the medication list:    Antara 130 Mg Caps (Fenofibrate micronized) .Marland Kitchen... Take one tablet daily Her updated medication list for this problem includes:    Lipitor 80 Mg Tabs (Atorvastatin calcium) .Marland Kitchen... Take one tablet daily  Orders: Venipuncture (53664) TLB-BMP (Basic Metabolic Panel-BMET) (80048-METABOL) TLB-Lipid Panel (80061-LIPID) TLB-CBC Platelet - w/Differential (85025-CBCD) TLB-Hepatic/Liver Function Pnl (80076-HEPATIC) TLB-TSH (Thyroid Stimulating Hormone) (84443-TSH) T-Vitamin D (25-Hydroxy) (40347-42595) TLB-A1C / Hgb A1C (Glycohemoglobin) (83036-A1C) TLB-Microalbumin/Creat Ratio, Urine (82043-MALB) EKG w/ Interpretation (93000)  Problem # 5:  GERD (ICD-530.81)  Her updated medication list for this problem includes:    Protonix 40 Mg Tbec (Pantoprazole sodium) .Marland Kitchen... Take one tablet once daily  Complete Medication List: 1)  Actoplus Met 15-850 Mg Tabs (Pioglitazone hcl-metformin hcl) .... Take one tablet by mouth twice daily 2)  Amitriptyline Hcl 25 Mg  Tabs (Amitriptyline hcl) .... Take two tablets daily 3)  Hydrochlorothiazide 25 Mg Tabs (Hydrochlorothiazide) .... Take one tab every day 4)  Protonix 40 Mg Tbec (Pantoprazole sodium) .... Take one tablet once daily 5)  Diazepam 5 Mg Tabs (Diazepam) .... As needed at bedtime 6)  Lipitor 80 Mg Tabs (Atorvastatin calcium) .... Take one tablet daily 7)  Hydrochlorothiazide 25 Mg Tabs  (Hydrochlorothiazide) .... Take one tablet daily 8)  Naproxen 500 Mg Tabs (Naproxen) .... Take one tablet twice daily  Other Orders: Pneumococcal Vaccine ([address]) Admin 1st Vaccine (60454) Prescriptions: LIPITOR 80 MG  TABS (ATORVASTATIN CALCIUM) take one tablet daily  #90 x 3   Entered and Authorized by:   Loreen Freud DO   Signed by:   Loreen Freud DO on 11/03/2008   Method used:   Electronically to        [facility]* (mail-order)             ,          Ph: [phone]       Fax: [phone]   RxID:   [id number] PROTONIX 40 MG  TBEC (PANTOPRAZOLE SODIUM) take one tablet once daily  #90 x 3   Entered and Authorized by:   Loreen Freud DO   Signed by:   Loreen Freud DO on 11/03/2008   Method used:   Electronically to        [facility]* (mail-order)             ,          Ph: [phone]       Fax: [phone]   RxID:   [id number] HYDROCHLOROTHIAZIDE 25 MG  TABS (HYDROCHLOROTHIAZIDE) take one tab every day  #90 x 3   Entered and Authorized by:   Loreen Freud DO   Signed by:   Loreen Freud DO on 11/03/2008   Method used:   Electronically to        [facility]* (mail-order)             ,          Ph: [phone]       Fax: [phone]   RxID:   [id number] AMITRIPTYLINE HCL 25 MG TABS (AMITRIPTYLINE HCL) TAKE TWO TABLETS DAILY  #180 x 0   Entered and Authorized by:   Loreen Freud DO   Signed by:   Loreen Freud DO on 11/03/2008   Method used:   Electronically to        [facility]* (mail-order)             ,          Ph: [phone]       Fax: [phone]   RxID:   [id number] ACTOPLUS MET 15-850 MG TABS (PIOGLITAZONE HCL-METFORMIN HCL) take one tablet by mouth twice daily  #180 x 3   Entered and Authorized by:   Loreen Freud DO   Signed by:   Loreen Freud DO on 11/03/2008   Method used:   Electronically to        [facility]* (mail-order)             ,          Ph: [phone]       Fax: [phone]   RxID:    [id number]    EKG  Procedure date:  11/03/2008  Findings:      sinus rhythm 83 bpm      Pneumovax Vaccine  Vaccine Type: Pneumovax    Site: left deltoid    Mfr: Merck    Dose: 0.5 ml    Route: IM    Given by: Ardyth Man    Exp. Date: 07/31/2009    Lot #: [id number]    VIS given: 01/22/96 version given Nov 03, 2008.

## 2010-07-26 NOTE — Letter (Signed)
Summary: Primary Care Consult Scheduled Letter  [facility] [place]  [address] [place], [place] [address]   Phone: [phone]  Fax: [phone]      12/13/2007 MRN: [id number]  Alyssa Ford [address] [place], [place]  [address]    Dear Ms. [place],      We have scheduled an appointment for you.  At the recommendation of Dr.Lowne, we have scheduled you a consult with [facility] on 01/01/08 at 3:10.  Their address is [address]. The office phone number is [phone].  If this appointment day and time is not convenient for you, please feel free to call the office of the doctor you are being referred to at the number listed above and reschedule the appointment.     It is important for you to keep your scheduled appointments. We are here to make sure you are given good patient care. If you have questions or you have made changes to your appointment, please notify us at  [phone], ask for Tiffany.    Thank you,  Patient Care Coordinator [place] [facility]

## 2010-07-26 NOTE — Letter (Signed)
Summary: External Correspondence/[facility]  External Correspondence/[facility]   Imported By: Freddy Jaksch 02/20/2007 11:25:27  _____________________________________________________________________  External Attachment:    Type:   Image     Comment:   External Document

## 2010-07-26 NOTE — Progress Notes (Signed)
  Phone Note Outgoing Call   Summary of Call: Patient aware of labs and vitd rx sent to pharmacy ...................................................................Ardyth Man  September 20, 2007 10:39 AM     New/Updated Medications: VITAMIN D 36644 UNIT  CAPS (ERGOCALCIFEROL) Take one capsule weekly   Prescriptions: VITAMIN D 03474 UNIT  CAPS (ERGOCALCIFEROL) Take one capsule weekly  #4 x 1   Entered by:   Ardyth Man   Authorized by:   Loreen Freud DO   Signed by:   Ardyth Man on 09/20/2007   Method used:   Electronically sent to ...       [facility]  [address] [id number]       [address]       [place], [place]  [address]       Ph: [phone] or [phone]       Fax: [phone]   RxID:   [id number]

## 2010-07-26 NOTE — Letter (Signed)
Summary: [facility]  [facility]   Imported By: Lanelle Bal 11/05/2008 08:28:31  _____________________________________________________________________  External Attachment:    Type:   Image     Comment:   External Document

## 2010-07-26 NOTE — Letter (Signed)
Summary: Results Follow-up Letter  [place] [facility]  [address] [place], [place] [address]   Phone: [phone]  Fax: [phone]    01/02/2008        Alyssa Ford [address] [place], [place]  [address]  Dear [name],   The following are the results of your recent test(s):  Test     Result     Pap Smear    Normal_______  Not Normal_____       Comments: _________________________________________________________ Cholesterol LDL(Bad cholesterol):          Your goal is less than:         HDL (Good cholesterol):        Your goal is more than: _________________________________________________________ Other Tests:   _________________________________________________________  Please call for an appointment Or ___Normal Mammogram______________________________________________________ _________________________________________________________ _________________________________________________________  Sincerely,  Ardyth Man [place] [facility]

## 2010-07-26 NOTE — Consult Note (Signed)
Summary: [facility]  [facility]   Imported By: Lanelle Bal 01/28/2008 15:11:00  _____________________________________________________________________  External Attachment:    Type:   Image     Comment:   External Document

## 2010-07-26 NOTE — Letter (Signed)
Summary: [facility]   Imported By: Freddy Jaksch 08/23/2007 08:56:07  _____________________________________________________________________  External Attachment:    Type:   Image     Comment:   External Document

## 2010-07-26 NOTE — Letter (Signed)
Summary: ORTHOPAEDIC AND HAND SPECIALIST  ORTHOPAEDIC AND HAND SPECIALIST   Imported By: Freddy Jaksch 05/30/2007 16:54:23  _____________________________________________________________________  External Attachment:    Type:   Image     Comment:   External Document

## 2010-07-26 NOTE — Progress Notes (Signed)
  Phone Note Call from Patient Call back at [facility] Phone [phone]   Summary of Call: Pt called and stated that she is havig problems getting her medication from [facility], [facility] [address]    Prescriptions: AMITRIPTYLINE HCL 25 MG TABS (AMITRIPTYLINE HCL) TAKE TWO TABLETS DAILY  #60 x 0   Entered by:   Army Fossa CMA   Authorized by:   Loreen Freud DO   Signed by:   Army Fossa CMA on 10/04/2009   Method used:   [facility]  [address] [id number]       [address]       [place], [place]  [address]       Ph: [phone] or [phone]       Fax: [phone]   RxID:   [id number]

## 2010-07-26 NOTE — Assessment & Plan Note (Signed)
Summary: FOLLOW UP ON ABNORMAL CT DONE AT [facility]   Vital Signs:  Patient Profile:   60 Years Old Female Weight:      169.13 pounds Temp:     98.0 degrees F oral Pulse rate:   78 / minute Resp:     18 per minute BP sitting:   130 / 74  (right arm)  Pt. in pain?   no  Vitals Entered By: Ardyth Man (September 20, 2007 1:38 PM)                  PCP:  Laury Axon  Chief Complaint:  follow up abnormal ct done at [facility].  History of Present Illness: Pt here to review Ct from hospital.  NO other complaints.    Current Allergies: No known allergies   Past Medical History:    Reviewed history from 09/11/2007 and no changes required:       GERD       Hyperlipidemia       Hypertension       Diabetes mellitus, type II       thyroid nodule   Family History:    Reviewed history from 11/22/2006 and no changes required:       Family History of CAD Female 1st degree relative <50       Family History Breast Cancer  Social History:    Reviewed history from 09/11/2007 and no changes required:       Occupation:  [employer]       Current Smoker       Alcohol use-yes       Drug use-no       Regular exercise-no       Married   Risk Factors: Tobacco use:  current Drug use:  no Alcohol use:  yes Exercise:  no   Review of Systems      See HPI   Physical Exam  General:     Well-developed,well-nourished,in no acute distress; alert,appropriate and cooperative throughout examination Lungs:     Normal respiratory effort, chest expands symmetrically. Lungs are clear to auscultation, no crackles or wheezes. Heart:     normal rate, regular rhythm, and no murmur.   Extremities:     No clubbing, cyanosis, edema, or deformity noted with normal full range of motion of all joints.      Impression & Recommendations:  Problem # 1:  THYROID NODULE (ICD-241.0) check [place] throid---  CT shows growing goiter ENT referral  Complete Medication List: 1)  Actoplus Met 15-850  Mg Tabs (Pioglitazone hcl-metformin hcl) .... Take one tablet by mouth twice daily 2)  Amitriptyline Hcl 25 Mg Tabs (Amitriptyline hcl) .... Take one tablet daily 3)  Hydrochlorothiazide 25 Mg Tabs (Hydrochlorothiazide) .... Take one tab every day 4)  Protonix 40 Mg Tbec (Pantoprazole sodium) .... Take one tablet 5)  Diazepam 5 Mg Tabs (Diazepam) .... As needed at bedtime 6)  Antara 130 Mg Caps (Fenofibrate micronized) .... Take one tablet daily 7)  Lipitor 80 Mg Tabs (Atorvastatin calcium) .... Take one tablet daily 8)  Hydrochlorothiazide 25 Mg Tabs (Hydrochlorothiazide) .... Take one tablet daily 9)  Naproxen 500 Mg Tabs (Naproxen) .... Take one tablet twice daily 10)  Vitamin D [address] Unit Caps (Ergocalciferol) .... Take one capsule weekly     ]

## 2010-07-26 NOTE — Progress Notes (Signed)
Summary: 3 REFILLS  Phone Note Refill Request Message from:  Patient on March 16, 2010 11:02 AM  Refills Requested: Medication #1:  AMITRIPTYLINE HCL 25 MG TABS TAKE TWO TABLETS DAILY  Medication #2:  DIAZEPAM 5 MG  TABS as needed at bedtime  Medication #3:  HYDROCHLOROTHIAZIDE 25 MG  TABS take one tab every day CALL THESE IN TO [facility]  Initial call taken by: Jerolyn Shin,  March 16, 2010 11:02 AM  Follow-up for Phone Call        last OV 02/23/10.  Additional Follow-up for Phone Call Additional follow up Details #1::        ok to refill x1  each Additional Follow-up by: Loreen Freud DO,  March 16, 2010 11:51 AM    Prescriptions: DIAZEPAM 5 MG  TABS (DIAZEPAM) as needed at bedtime  #30 x 0   Entered by:   Almeta Monas CMA (AAMA)   Authorized by:   Loreen Freud DO   Signed by:   Almeta Monas CMA (AAMA) on 03/16/2010   Method used:   Printed then faxed to ...       [facility]  [address]* (retail)       [address]       [place], [place]  [address]       Ph: [phone]       Fax: [phone]   RxID:   [id number] AMITRIPTYLINE HCL 25 MG TABS (AMITRIPTYLINE HCL) TAKE TWO TABLETS DAILY  #60 Tablet x 0   Entered by:   Almeta Monas CMA (AAMA)   Authorized by:   Loreen Freud DO   Signed by:   Almeta Monas CMA (AAMA) on 03/16/2010   Method used:   Faxed to ...       [facility]  [address]* (retail)       [address]       [place], [place]  [address]       Ph: [phone]       Fax: [phone]   RxID:   [id number] HYDROCHLOROTHIAZIDE 25 MG  TABS (HYDROCHLOROTHIAZIDE) take one tab every day  #90 x 0   Entered by:   Almeta Monas CMA (AAMA)   Authorized by:   Loreen Freud DO   Signed by:   Almeta Monas CMA (AAMA) on 03/16/2010   Method used:   Faxed to ...       [facility]  [address] (retail)       [address]       [place], [place]   [address]       Ph: [phone]       Fax: [phone]   RxID:   [id number]

## 2010-07-26 NOTE — Letter (Signed)
Summary: [employer]   Imported By: Freddy Jaksch 10/09/2007 10:35:15  _____________________________________________________________________  External Attachment:    Type:   Image     Comment:   External Document

## 2010-07-26 NOTE — Assessment & Plan Note (Signed)
Summary: head cold, cough///sph   Vital Signs:  Patient profile:   60 year old female Menstrual status:  postmenopausal Weight:      169.6 pounds O2 Sat:      95 % on Room air Temp:     97.7 degrees F oral Pulse rate:   99 / minute Pulse rhythm:   regular BP sitting:   120 / 76  (right arm) Cuff size:   large  Vitals Entered By: Almeta Monas CMA Duncan Dull) (April 26, 2010 10:51 AM)  O2 Flow:  Room air CC: x1week c/o cough, green phelgm, sinus pressure/drainage and ear pressure, URI symptoms   History of Present Illness:       This is a 60 year old woman who presents with URI symptoms.  The symptoms began 1 week ago.  Pt c/o B/L sinus pressure and swelling.  pt using mucinex with little relief.  The patient complains of nasal congestion, purulent nasal discharge, productive cough, and sick contacts.  The patient denies fever, low-grade fever (<100.5 degrees), fever of 100.5-103 degrees, fever of 103.1-104 degrees, fever to >104 degrees, stiff neck, dyspnea, wheezing, rash, vomiting, diarrhea, use of an antipyretic, and response to antipyretic.  The patient also reports headache.  The patient denies itchy watery eyes, itchy throat, sneezing, seasonal symptoms, response to antihistamine, muscle aches, and severe fatigue.  The patient denies the following risk factors for Strep sinusitis: unilateral facial pain, unilateral nasal discharge, poor response to decongestant, double sickening, tooth pain, Strep exposure, tender adenopathy, and absence of cough.    Current Medications (verified): 1)  Actoplus Met 15-850 Mg Tabs (Pioglitazone Hcl-Metformin Hcl) .... Take 2 Tabs By Mouth in Am and 1 By Mouth Q Dinner 2)  Amitriptyline Hcl 25 Mg Tabs (Amitriptyline Hcl) .... Take Two Tablets Daily 3)  Hydrochlorothiazide 25 Mg  Tabs (Hydrochlorothiazide) .... Take One Tab Every Day 4)  Omeprazole 20 Mg Cpdr (Omeprazole) .Marland Kitchen.. 1 By Mouth Once Daily 5)  Diazepam 5 Mg  Tabs (Diazepam) .... As Needed At  Bedtime 6)  Lipitor 80 Mg  Tabs (Atorvastatin Calcium) .... Take One Tablet Daily 7)  Hydrochlorothiazide 25 Mg  Tabs (Hydrochlorothiazide) .... Take One Tablet Daily 8)  Naproxen 500 Mg  Tabs (Naproxen) .... Take One Tablet Twice Daily 9)  Trilipix 135 Mg Cpdr (Choline Fenofibrate) .... Take One By Mouth Every Day. 10)  Vitamin D [address] Unit Caps (Ergocalciferol) .... Take One Capsule Weekly 11)  Lisinopril 5 Mg Tabs (Lisinopril) .Marland Kitchen.. 1 By Mouth Daily 12)  Januvia 100 Mg Tabs (Sitagliptin Phosphate) .... Take 1 By Mouth Once Daily 13)  Zetia 10 Mg Tabs (Ezetimibe) .... Take 1 By Mouth Once Daily 14)  Augmentin 875-125 Mg Tabs (Amoxicillin-Pot Clavulanate) .Marland Kitchen.. 1 By Mouth Two Times A Day  Allergies (verified): No Known Drug Allergies  Past History:  Past Medical History: Last updated: 02/23/2010 GERD Hyperlipidemia Hypertension Diabetes mellitus, type II thyroid nodule Current Problems:  UNSPECIFIED CATARACT (ICD-366.9) POSTMENOPAUSAL STATUS (ICD-627.2) PREVENTIVE HEALTH CARE (ICD-V70.0) FAMILY HISTORY DIABETES 1ST DEGREE RELATIVE (ICD-V18.0) FATIGUE (ICD-780.79) DIABETES MELLITUS, TYPE II (ICD-250.00) FAMILY HISTORY OF CAD FEMALE 1ST DEGREE RELATIVE <50 (ICD-V17.3) DM (ICD-250.00) GLUCOMA (ICD-365.9) THYROID NODULE (ICD-241.0) HYPERTENSION (ICD-401.9) HYPERLIPIDEMIA (ICD-272.4) GERD (ICD-530.81)  Past Surgical History: Last updated: 11/03/2008 Cholecystectomy Carpal tunnel release (2006) Carpal tunnel release (12/24/2007---  Kuzma Cervical fusion (3/08)--Jenkins  Family History: Last updated: 02/23/2010 Family History of CAD Female 1st degree relative <50 Family History Breast Cancer--M 61yo Family History Diabetes 1st degree relative--M  and F M--Dementia father==parkinsons dz  Social History: Last updated: 02/23/2010  unemployed Current Smoker Alcohol use-yes Drug use-no Married Regular exercise-yes  Risk Factors: Alcohol Use: <1 (02/23/2010) >5 drinks/d  w/in last 3 months: no (02/23/2010) Caffeine Use: 5 (02/23/2010) Exercise: yes (02/23/2010)  Risk Factors: Smoking Status: current (02/23/2010) Packs/Day: 0.5 (02/23/2010) Passive Smoke Exposure: yes (02/23/2010)  Review of Systems      See HPI  Physical Exam  General:  Well-developed,well-nourished,in no acute distress; alert,appropriate and cooperative throughout examination Ears:  External ear exam shows no significant lesions or deformities.  Otoscopic examination reveals clear canals, tympanic membranes are intact bilaterally without bulging, retraction, inflammation or discharge. Hearing is grossly normal bilaterally. Nose:  mucosal erythema, mucosal edema, L frontal sinus tenderness, L maxillary sinus tenderness, R frontal sinus tenderness, and R maxillary sinus tenderness.   Mouth:  pharyngeal erythema and postnasal drip.   Neck:  No deformities, masses, or tenderness noted. Lungs:  Normal respiratory effort, chest expands symmetrically. Lungs are clear to auscultation, no crackles or wheezes. Heart:  normal rate and no murmur.   Psych:  Cognition and judgment appear intact. Alert and cooperative with normal attention span and concentration. No apparent delusions, illusions, hallucinations   Impression & Recommendations:  Problem # 1:  SINUSITIS - ACUTE-NOS (ICD-461.9)  Her updated medication list for this problem includes:    Augmentin 875-125 Mg Tabs (Amoxicillin-pot clavulanate) .Marland Kitchen... 1 by mouth two times a day    Veramyst 27.5 Mcg/spray Susp (Fluticasone furoate) .Marland Kitchen... 2 sprays each nostril once daily  Instructed on treatment. Call if symptoms persist or worsen.   Complete Medication List: 1)  Actoplus Met 15-850 Mg Tabs (Pioglitazone hcl-metformin hcl) .... Take 2 tabs by mouth in am and 1 by mouth q dinner 2)  Amitriptyline Hcl 25 Mg Tabs (Amitriptyline hcl) .... Take two tablets daily 3)  Hydrochlorothiazide 25 Mg Tabs (Hydrochlorothiazide) .... Take one tab every  day 4)  Omeprazole 20 Mg Cpdr (Omeprazole) .Marland Kitchen.. 1 by mouth once daily 5)  Diazepam 5 Mg Tabs (Diazepam) .... As needed at bedtime 6)  Lipitor 80 Mg Tabs (Atorvastatin calcium) .... Take one tablet daily 7)  Hydrochlorothiazide 25 Mg Tabs (Hydrochlorothiazide) .... Take one tablet daily 8)  Naproxen 500 Mg Tabs (Naproxen) .... Take one tablet twice daily 9)  Trilipix 135 Mg Cpdr (Choline fenofibrate) .... Take one by mouth every day. 10)  Vitamin D [address] Unit Caps (Ergocalciferol) .... Take one capsule weekly 11)  Lisinopril 5 Mg Tabs (Lisinopril) .Marland Kitchen.. 1 by mouth daily 12)  Januvia 100 Mg Tabs (Sitagliptin phosphate) .... Take 1 by mouth once daily 13)  Zetia 10 Mg Tabs (Ezetimibe) .... Take 1 by mouth once daily 14)  Augmentin 875-125 Mg Tabs (Amoxicillin-pot clavulanate) .Marland Kitchen.. 1 by mouth two times a day 15)  Veramyst 27.5 Mcg/spray Susp (Fluticasone furoate) .... 2 sprays each nostril once daily  Other Orders: Depo- Medrol 80mg  (J1040) Admin of Therapeutic Inj  intramuscular or subcutaneous ([address])  Patient Instructions: 1)  Acute sinusitis symptoms for less than 10 days are not helped by antibiotics. Use warm moist compresses, and over the counter decongestants( only as directed). Call if no improvement in 5-7 days, sooner if increasing pain, fever, or new symptoms.  Prescriptions: AUGMENTIN 875-125 MG TABS (AMOXICILLIN-POT CLAVULANATE) 1 by mouth two times a day  #20 x 0   Entered and Authorized by:   Loreen Freud DO   Signed by:   Loreen Freud DO on 04/26/2010   Method used:  Electronically to        [facility] (retail)       [address]       [place], [place]  [address]       Ph: [phone]       Fax: [phone]   RxID:   [id number]    Medication Administration  Injection # 1:    Medication: Depo- Medrol 80mg     Diagnosis: URI (ICD-465.9)    Route: IM    Site: RUOQ gluteus    Exp Date: 04/26/2010    Lot #: [id number]     Mfr: Pharmacia    Patient tolerated injection without complications    Given by: Almeta Monas CMA Duncan Dull) (April 26, 2010 12:06 PM)  Orders Added: 1)  Depo- Medrol 80mg  [J1040] 2)  Admin of Therapeutic Inj  intramuscular or subcutaneous [[address]] 3)  Est. Patient Level III [[address]]

## 2010-07-26 NOTE — Letter (Signed)
Summary: Results Follow-up Letter  [place] at [facility]  [address] [place], [place] [address]   Phone: [phone]  Fax: [phone]    11/04/2008        [address] [place], [place]  [address]  Dear Alyssa Ford,   The following are the results of your recent test(s):  Test     Result     Pap Smear    Normal_______  Not Normal_____       Comments: _________________________________________________________ Cholesterol LDL(Bad cholesterol):          Your goal is less than:         HDL (Good cholesterol):        Your goal is more than: _________________________________________________________ Other Tests:   _________________________________________________________  Please call for an appointment Or _Please see attached lab reports and changes in medication, adding trilipix 135 one by mouth daily and actoplus met 15/850 2 tablets every morning and one tablet every evening with dinner.________________________________________________________ _________________________________________________________ _________________________________________________________  Sincerely,  Ardyth Man [place] at [facility]

## 2010-07-26 NOTE — Letter (Signed)
Summary: Primary Care Consult Scheduled Letter  [place] [facility] [place]  [address] [place], [place] [address]   Phone: [phone]  Fax: [phone]      09/24/2007 MRN: [id number]  Alyssa Ford [address] [place], [place]  [address]    Dear [name],      We have scheduled an appointment for you.  At the recommendation of DR.PAZ, we have scheduled you a consult with [facility], [address] on 09/30/07 at 2:30.  Their phone number is [phone].  If this appointment day and time is not convenient for you, please feel free to call the office of the doctor you are being referred to at the number listed above and reschedule the appointment.     It is important for you to keep your scheduled appointments. We are here to make sure you are given good patient care. If yu have questions or you have made changes to your appointment, please notify us at  [phone], ask for [facility].    Thank you,  Patient Care Coordinator [place] at [facility]

## 2010-07-26 NOTE — Letter (Signed)
Summary: Results Follow-up Letter  [place] at [facility]  [address] [place], [place] [address]   Phone: [phone]  Fax: [phone]    12/19/2007        [address] [place] [address] [place], [place]  [address]  Dear Ms. Sinha,   The following are the results of your recent test(s):  Test     Result     Pap Smear    Normal_______  Not Normal_____       Comments: _________________________________________________________ Cholesterol LDL(Bad cholesterol):          Your goal is less than:         HDL (Good cholesterol):        Your goal is more than: _________________________________________________________ Other Tests:   _________________________________________________________  Please call for an appointment Or _Please see attached.________________________________________________________ _________________________________________________________ _________________________________________________________  Sincerely,  Ardyth Man [place] at [facility]

## 2010-07-26 NOTE — Progress Notes (Signed)
  Phone Note Refill Request   Refills Requested: Medication #1:  AMITRIPTYLINE HCL 25 MG TABS TAKE TWO TABLETS DAILY 90-day [facility] supply [facility] [address]...Marland KitchenFelecia Deloach CMA  March 10, 2009 3:12 PM    Follow-up for Phone Call        pt aware rx sent to pharmacy...........Marland KitchenFelecia Deloach CMA  March 10, 2009 3:14 PM     Prescriptions: AMITRIPTYLINE HCL 25 MG TABS (AMITRIPTYLINE HCL) TAKE TWO TABLETS DAILY  #180 x 0   Entered by:   Jeremy Johann CMA   Authorized by:   Loreen Freud DO   Signed by:   Jeremy Johann CMA on 03/10/2009   Method used:   Faxed to ...       [facility]* (mail-order)             ,          Ph: [phone]       Fax: [phone]   RxID:   [id number] AMITRIPTYLINE HCL 25 MG TABS (AMITRIPTYLINE HCL) TAKE TWO TABLETS DAILY  #60 x 0   Entered by:   Jeremy Johann CMA   Authorized by:   Loreen Freud DO   Signed by:   Jeremy Johann CMA on 03/10/2009   Method used:   [facility] ...       [facility]  [address] [id number]       [address]       [place], [place]  [address]       Ph: [phone] or [phone]       Fax: [phone]   RxID:   [id number]

## 2010-07-26 NOTE — Letter (Signed)
Summary: Orthopaedic and Hand Specialist  Orthopaedic and Hand Specialist   Imported By: Freddy Jaksch 12/02/2007 15:59:02  _____________________________________________________________________  External Attachment:    Type:   Image     Comment:   External Document

## 2010-07-26 NOTE — Progress Notes (Signed)
  Phone Note Outgoing Call Call back at [facility] Phone [phone]   Call placed by: Ardyth Man,  September 24, 2007 9:10 PM Call placed to: Patient Summary of Call: Left message for patient to call office and will fax to ENT. ie:  +thyroid nodule  Follow-up for Phone Call        pt is already aware of her app[facility] and she is suppose to pick up her films to take to her appt Follow-up by: Gwen Pounds,  September 26, 2007 2:27 PM

## 2010-07-26 NOTE — Assessment & Plan Note (Signed)
Summary: cpx//tl   Vital Signs:  Patient Profile:   60 Years Old Female Weight:      168 pounds Temp:     98.2 degrees F oral Pulse rate:   74 / minute Resp:     18 per minute BP sitting:   110 / 78  (right arm)  Vitals Entered By: Ardyth Man (December 12, 2007 8:38 AM)              Vision Screening: Left eye with correction: 20 / 10 Right eye with correction: 20 / 10 Both eyes with correction: 20 / 10        Vision Entered By: Ardyth Man (December 12, 2007 8:39 AM)   PCP:  Laury Axon  Chief Complaint:  CPX-Fasting Labs-Pap.  History of Present Illness: Pt here for CPE and pap with labs.  No complaints.    Current Allergies: No known allergies   Past Medical History:    Reviewed history from 09/11/2007 and no changes required:       GERD       Hyperlipidemia       Hypertension       Diabetes mellitus, type II       thyroid nodule  Past Surgical History:    Reviewed history from 09/11/2007 and no changes required:       Cholecystectomy       Carpal tunnel release (2006)       Carpal tunnel release (06/30/200       Cervical fusion (3/08)--Jenkins   Family History:    Reviewed history from 11/22/2006 and no changes required:       Family History of CAD Female 1st degree relative <50       Family History Breast Cancer--M 61yo       Family History Diabetes 1st degree relative--M and F  Social History:    Reviewed history from 09/11/2007 and no changes required:       Occupation:  [employer]       Current Smoker       Alcohol use-yes       Drug use-no       Regular exercise-no       Married   Risk Factors:  Tobacco use:  current    Year started:  1968    Cigarettes:  Yes -- 2 pack(s) per day    Counseled to quit/cut down tobacco use:  yes Passive smoke exposure:  yes Drug use:  no HIV high-risk behavior:  no Caffeine use:  4 drinks per day Alcohol use:  yes    Type:  beer or wine    Drinks per day:  <1    Has patient --       Felt need to cut  down:  no       Been annoyed by complaints:  no       Felt guilty about drinking:  no       Needed eye opener in the morning:  no    Counseled to quit/cut down alcohol use:  no Exercise:  no Seatbelt use:  100 % Sun Exposure:  frequently  Family History Risk Factors:    Family History of MI in females < 58 years old:  no    Family History of MI in males < 56 years old:  no  Colonoscopy History:     Date of Last Colonoscopy:  08/08/2006    Results:  Normal-- repeat 10 years    Review  of Systems      See HPI  General      Denies chills, fatigue, fever, loss of appetite, malaise, sleep disorder, sweats, weakness, and weight loss.  Eyes      Denies blurring, discharge, double vision, eye irritation, eye pain, halos, itching, light sensitivity, red eye, vision loss-1 eye, and vision loss-both eyes.      optho--  q46m  ENT      Denies decreased hearing, difficulty swallowing, ear discharge, earache, hoarseness, nasal congestion, nosebleeds, postnasal drainage, ringing in ears, sinus pressure, and sore throat.      dentist--q83m  CV      Denies bluish discoloration of lips or nails, chest pain or discomfort, difficulty breathing at night, difficulty breathing while lying down, fainting, fatigue, leg cramps with exertion, lightheadness, near fainting, palpitations, shortness of breath with exertion, swelling of feet, swelling of hands, and weight gain.  Resp      Denies chest discomfort, chest pain with inspiration, cough, coughing up blood, excessive snoring, hypersomnolence, morning headaches, pleuritic, shortness of breath, sputum productive, and wheezing.  GI      Denies abdominal pain, bloody stools, change in bowel habits, constipation, dark tarry stools, diarrhea, excessive appetite, gas, hemorrhoids, indigestion, loss of appetite, nausea, vomiting, vomiting blood, and yellowish skin color.  GU      Denies abnormal vaginal bleeding, decreased libido, discharge, dysuria, genital  sores, hematuria, incontinence, nocturia, urinary frequency, and urinary hesitancy.  MS      Denies joint pain, joint redness, joint swelling, loss of strength, low back pain, mid back pain, muscle aches, muscle , cramps, muscle weakness, stiffness, and thoracic pain.  Derm      Denies changes in color of skin, changes in nail beds, dryness, excessive perspiration, flushing, hair loss, insect bite(s), itching, lesion(s), poor wound healing, and rash.  Neuro      Denies brief paralysis, difficulty with concentration, disturbances in coordination, falling down, headaches, inability to speak, memory loss, numbness, poor balance, seizures, sensation of room spinning, tingling, tremors, visual disturbances, and weakness.  Psych      Denies alternate hallucination ( auditory/visual), anxiety, depression, easily angered, easily tearful, irritability, mental problems, panic attacks, sense of great danger, suicidal thoughts/plans, thoughts of violence, unusual visions or sounds, and thoughts /plans of harming others.  Endo      Denies cold intolerance, excessive hunger, excessive thirst, excessive urination, heat intolerance, polyuria, and weight change.  Heme      Denies abnormal bruising, bleeding, enlarge lymph nodes, fevers, pallor, and skin discoloration.  Allergy      Denies hives or rash, itching eyes, persistent infections, seasonal allergies, and sneezing.   Physical Exam  General:     Well-developed,well-nourished,in no acute distress; alert,appropriate and cooperative throughout examination Head:     Normocephalic and atraumatic without obvious abnormalities. No apparent alopecia or balding. Eyes:     vision grossly intact, pupils equal, pupils round, pupils reactive to light, and no injection.   Ears:     External ear exam shows no significant lesions or deformities.  Otoscopic examination reveals clear canals, tympanic membranes are intact bilaterally without bulging, retraction,  inflammation or discharge. Hearing is grossly normal bilaterally. Nose:     External nasal examination shows no deformity or inflammation. Nasal mucosa are pink and moist without lesions or exudates. Mouth:     Oral mucosa and oropharynx without lesions or exudates.  Teeth in good repair. Neck:     No deformities, masses, or tenderness noted.normal carotid  upstroke, no carotid bruits, and no cervical lymphadenopathy.   Chest Wall:     No deformities, masses, or tenderness noted. Breasts:     No mass, nodules, thickening, tenderness, bulging, retraction, inflamation, nipple discharge or skin changes noted.   Lungs:     Normal respiratory effort, chest expands symmetrically. Lungs are clear to auscultation, no crackles or wheezes. Heart:     normal rate, regular rhythm, and no murmur.   Abdomen:     Bowel sounds positive,abdomen soft and non-tender without masses, organomegaly or hernias noted. Rectal:     No external abnormalities noted. Normal sphincter tone. No rectal masses or tenderness. Genitalia:     Pelvic Exam:        External: normal female genitalia without lesions or masses        Vagina: normal without lesions or masses        Cervix: normal without lesions or masses        Adnexa: normal bimanual exam without masses or fullness        Uterus: normal by palpation        Pap smear: performed Msk:     normal ROM, no joint tenderness, no joint swelling, no joint warmth, no redness over joints, no joint deformities, no joint instability, no crepitation, and no muscle atrophy.    Diabetes Management Exam:    Foot Exam (with socks and/or shoes not present):       Sensory-Pinprick/Light touch:          Left medial foot (L-4): normal          Left dorsal foot (L-5): normal          Left lateral foot (S-1): normal          Right medial foot (L-4): normal          Right dorsal foot (L-5): normal          Right lateral foot (S-1): normal       Sensory-Monofilament:           Left foot: normal          Right foot: normal       Inspection:          Left foot: normal          Right foot: normal       Nails:          Left foot: normal          Right foot: normal    Eye Exam:       Eye Exam done elsewhere    Impression & Recommendations:  Problem # 1:  PREVENTIVE HEALTH CARE (ICD-V70.0) gHm utd check labs check mammo Immun UTD Orders: Radiology Referral (Radiology) Venipuncture (84696) TLB-BMP (Basic Metabolic Panel-BMET) (80048-METABOL) TLB-CBC Platelet - w/Differential (85025-CBCD) TLB-Hepatic/Liver Function Pnl (80076-HEPATIC) TLB-TSH (Thyroid Stimulating Hormone) (84443-TSH) EKG w/ Interpretation (93000)   Problem # 2:  DIABETES MELLITUS, TYPE II (ICD-250.00)  Her updated medication list for this problem includes:    Actoplus Met 15-850 Mg Tabs (Pioglitazone hcl-metformin hcl) .Marland Kitchen... Take one tablet by mouth twice daily  Orders: Radiology Referral (Radiology) Venipuncture [phone]) TLB-BMP (Basic Metabolic Panel-BMET) (80048-METABOL) TLB-CBC Platelet - w/Differential (85025-CBCD) TLB-Hepatic/Liver Function Pnl (80076-HEPATIC) TLB-TSH (Thyroid Stimulating Hormone) (84443-TSH) TLB-A1C / Hgb A1C (Glycohemoglobin) (83036-A1C) TLB-Microalbumin/Creat Ratio, Urine (82043-MALB) EKG w/ Interpretation (93000)  Labs Reviewed: HgBA1c: 6.5 (09/11/2007)   Creat: 0.7 (09/11/2007)      Problem # 3:  HYPERTENSION (ICD-401.9)  Her  updated medication list for this problem includes:    Hydrochlorothiazide 25 Mg Tabs (Hydrochlorothiazide) .Marland Kitchen... Take one tab every day    Hydrochlorothiazide 25 Mg Tabs (Hydrochlorothiazide) .Marland Kitchen... Take one tablet daily  Orders: Venipuncture (36644) TLB-BMP (Basic Metabolic Panel-BMET) (80048-METABOL) TLB-CBC Platelet - w/Differential (85025-CBCD) TLB-Hepatic/Liver Function Pnl (80076-HEPATIC) TLB-TSH (Thyroid Stimulating Hormone) (84443-TSH) EKG w/ Interpretation (93000)  BP today: 110/78 Prior BP: 130/74  (09/20/2007)  Labs Reviewed: Creat: 0.7 (09/11/2007) Chol: 170 (10/15/2007)   HDL: 39.0 (10/15/2007)   LDL: 99 (10/15/2007)   TG: 159 (10/15/2007)   Problem # 4:  HYPERLIPIDEMIA (ICD-272.4)  Her updated medication list for this problem includes:    Antara 130 Mg Caps (Fenofibrate micronized) .Marland Kitchen... Take one tablet daily    Lipitor 80 Mg Tabs (Atorvastatin calcium) .Marland Kitchen... Take one tablet daily  Orders: Venipuncture (03474) TLB-BMP (Basic Metabolic Panel-BMET) (80048-METABOL) TLB-CBC Platelet - w/Differential (85025-CBCD) TLB-Hepatic/Liver Function Pnl (80076-HEPATIC) TLB-TSH (Thyroid Stimulating Hormone) (84443-TSH) EKG w/ Interpretation (93000)  Labs Reviewed: Chol: 170 (10/15/2007)   HDL: 39.0 (10/15/2007)   LDL: 99 (10/15/2007)   TG: 159 (10/15/2007) SGOT: 18 (10/15/2007)   SGPT: 24 (10/15/2007)   Problem # 5:  GERD (ICD-530.81)  Her updated medication list for this problem includes:    Protonix 40 Mg Tbec (Pantoprazole sodium) .Marland Kitchen... Take one tablet  Orders: EKG w/ Interpretation (93000)   Complete Medication List: 1)  Actoplus Met 15-850 Mg Tabs (Pioglitazone hcl-metformin hcl) .... Take one tablet by mouth twice daily 2)  Amitriptyline Hcl 25 Mg Tabs (Amitriptyline hcl) .... Take two tablets daily 3)  Hydrochlorothiazide 25 Mg Tabs (Hydrochlorothiazide) .... Take one tab every day 4)  Protonix 40 Mg Tbec (Pantoprazole sodium) .... Take one tablet 5)  Diazepam 5 Mg Tabs (Diazepam) .... As needed at bedtime 6)  Antara 130 Mg Caps (Fenofibrate micronized) .... Take one tablet daily 7)  Lipitor 80 Mg Tabs (Atorvastatin calcium) .... Take one tablet daily 8)  Hydrochlorothiazide 25 Mg Tabs (Hydrochlorothiazide) .... Take one tablet daily 9)  Naproxen 500 Mg Tabs (Naproxen) .... Take one tablet twice daily 10)  Vitamin D 25956 Unit Caps (Ergocalciferol) .... Take one capsule weekly    Prescriptions: AMITRIPTYLINE HCL 25 MG TABS (AMITRIPTYLINE HCL) TAKE TWO TABLETS DAILY   #60 x 3   Entered by:   Ardyth Man   Authorized by:   Loreen Freud DO   Signed by:   Ardyth Man on 12/12/2007   Method used:   Electronically sent to ...       [facility]  [address] [id number]       [address]       [place], [place]  [address]       Ph: [phone] or [phone]       Fax: [phone]   RxID:   [id number]  ]  EKG  Procedure date:  12/12/2007  Findings:      sinus rhythm 86 bpm   EKG  Procedure date:  12/12/2007  Findings:      sinus rhythm 86 bpm   Laboratory Results   Urine Tests   Date/Time Reported: December 12, 2007 1:22 PM   Routine Urinalysis   Color: yellow Appearance: Clear Glucose: negative   (Normal Range: Negative) Bilirubin: negative   (Normal Range: Negative) Ketone: negative   (Normal Range: Negative) Spec. Gravity: <1.005   (Normal Range: 1.003-1.035) Blood: trace-lysed   (Normal Range: Negative) pH: 8.0   (Normal Range: 5.0-8.0) Protein: negative   (  Normal Range: Negative) Urobilinogen: 0.2   (Normal Range: 0-1) Nitrite: negative   (Normal Range: Negative) Leukocyte Esterace: negative   (Normal Range: Negative)    Comments: Trace of blood possibly due to pap. not enough to culture./[facility]  December 12, 2007 1:23 PM

## 2010-07-26 NOTE — Consult Note (Signed)
Summary: [facility]  [facility] [place]   Imported By: Lanelle Bal 02/05/2008 11:03:20  _____________________________________________________________________  External Attachment:    Type:   Image     Comment:   External Document

## 2010-07-26 NOTE — Progress Notes (Signed)
  Phone Note Outgoing Call   Call placed by: Ardyth Man,  September 20, 2007 12:56 PM Summary of Call: Left message for patient to call office (I need to speak with patient concerning her most recent labs,  Is she taking her medication and if she is we will refer her to the lipid clinic). ...................................................................Ardyth Man  September 20, 2007 12:57 PM   Follow-up for Phone Call        Patient aware of labs and referral ...................................................................Ardyth Man  September 20, 2007 1:00 PM  Follow-up by: Ardyth Man,  September 20, 2007 1:00 PM

## 2010-07-26 NOTE — Letter (Signed)
Summary: Results Follow-up Letter  [place] [facility]  [address] [place], [place] [address]   Phone: [phone]  Fax: [phone]    11/06/2008        Alyssa Ford [address] [place], [place]  [address]  Dear Ms. Chamorro,   The following are the results of your recent test(s):  Test     Result     Pap Smear    Normal_______  Not Normal_____       Comments: _________________________________________________________ Cholesterol LDL(Bad cholesterol):          Your goal is less than:         HDL (Good cholesterol):        Your goal is more than: _________________________________________________________ Other Tests:   _________________________________________________________  Please call for an appointment Or __Please see attached lab report and new medication._______________________________________________________ _________________________________________________________ _________________________________________________________  Sincerely,  Ardyth Man [place] [facility]

## 2010-07-26 NOTE — Assessment & Plan Note (Signed)
Summary: tired--acute only--tl   Vital Signs:  Patient Profile:   60 Years Old Female Weight:      169.2 pounds Pulse rate:   92 / minute BP sitting:   134 / 80  (left arm)  Vitals Entered By: Doristine Devoid (September 11, 2007 11:03 AM)                 PCP:  Laury Axon  Chief Complaint:  went to ER for chest pain everything was normal  and Type 2 diabetes mellitus follow-up.  History of Present Illness: Pt here c/o fatigue and went to ER last Thursday with Chest Pain ------Er felt like it was muscular.  Pain went away with muscle relaxer.   Pain now gone but she is exhausted.    Type 2 Diabetes Mellitus Follow-Up      This is a 60 year old woman who presents for Type 2 diabetes mellitus follow-up.  The patient denies polyuria, polydipsia, blurred vision, self managed hypoglycemia, hypoglycemia requiring help, weight loss, weight gain, and numbness of extremities.  The patient denies the following symptoms: neuropathic pain, chest pain, vomiting, orthostatic symptoms, poor wound healing, intermittent claudication, vision loss, and foot ulcer.  Since the last visit the patient reports good dietary compliance, compliance with medications, exercising regularly, and monitoring blood glucose.  The patient has been measuring capillary blood glucose before breakfast.  Since the last visit, the patient reports having had eye care by an ophthalmologist and no foot care.    Hypertension Follow-Up      The patient also presents for Hypertension follow-up.  The patient denies lightheadedness, urinary frequency, headaches, edema, impotence, rash, and fatigue.  The patient denies the following associated symptoms: chest pain, chest pressure, exercise intolerance, dyspnea, palpitations, syncope, leg edema, and pedal edema.  Compliance with medications (by patient report) has been near 100%.  The patient reports that dietary compliance has been good.  The patient reports no exercise.  Adjunctive measures  currently used by the patient include salt restriction.    Hyperlipidemia Follow-Up      The patient also presents for Hyperlipidemia follow-up.  The patient denies muscle aches, GI upset, abdominal pain, flushing, itching, constipation, diarrhea, and fatigue.  The patient denies the following symptoms: chest pain/pressure, exercise intolerance, dypsnea, palpitations, syncope, and pedal edema.  Compliance with medications (by patient report) has been near 100%.  Dietary compliance has been good.  The patient reports no exercise.      Past Medical History:    Reviewed history from 11/22/2006 and no changes required:       GERD       Hyperlipidemia       Hypertension       Diabetes mellitus, type II       thyroid nodule  Past Surgical History:    cervical disk-- titanium disk  09/17/2006    Carpal tunnel release    Cholecystectomy    Tubal ligation   Family History:    Reviewed history from 11/22/2006 and no changes required:       Family History of CAD Female 1st degree relative <50       Family History Breast Cancer  Social History:    Reviewed history and no changes required:       Occupation:  [employer]       Current Smoker       Alcohol use-yes       Drug use-no       Regular exercise-no  Married   Risk Factors:  Tobacco use:  current Drug use:  no Alcohol use:  yes Exercise:  no   Review of Systems      See HPI   Physical Exam  General:     Well-developed,well-nourished,in no acute distress; alert,appropriate and cooperative throughout examination Neck:     No deformities, masses, or tenderness noted. Lungs:     Normal respiratory effort, chest expands symmetrically. Lungs are clear to auscultation, no crackles or wheezes. Heart:     normal rate, regular rhythm, and no murmur.   Extremities:     No clubbing, cyanosis, edema, or deformity noted with normal full range of motion of all joints.   Skin:     Intact without suspicious lesions or rashes Psych:      Cognition and judgment appear intact. Alert and cooperative with normal attention span and concentration. No apparent delusions, illusions, hallucinations  Diabetes Management Exam:    Foot Exam (with socks and/or shoes not present):       Sensory-Pinprick/Light touch:          Left medial foot (L-4): normal          Left dorsal foot (L-5): normal          Left lateral foot (S-1): normal          Right medial foot (L-4): normal          Right dorsal foot (L-5): normal          Right lateral foot (S-1): normal       Sensory-Monofilament:          Left foot: normal          Right foot: normal       Inspection:          Left foot: normal          Right foot: normal       Nails:          Left foot: normal          Right foot: normal    Eye Exam:       Eye Exam done elsewhere    Impression & Recommendations:  Problem # 1:  FATIGUE (ICD-780.79)  Orders: Venipuncture ([address]) TLB-B12 + Folate Pnl (60454_09811-B14/NWG) TLB-Lipid Panel (80061-LIPID) TLB-BMP (Basic Metabolic Panel-BMET) (80048-METABOL) TLB-CBC Platelet - w/Differential (85025-CBCD) TLB-Hepatic/Liver Function Pnl (80076-HEPATIC) TLB-TSH (Thyroid Stimulating Hormone) (84443-TSH) TLB-CRP-Full Range (C-Reactive Protein) (86140-FCRP) TLB-A1C / Hgb A1C (Glycohemoglobin) (83036-A1C) T-Vitamin D (25-Hydroxy) (956[address]-30865)   Problem # 2:  DIABETES MELLITUS, TYPE II (ICD-250.00)  Orders: Venipuncture (78469) TLB-B12 + Folate Pnl (62952_84132-G40/NUU) TLB-Lipid Panel (80061-LIPID) TLB-BMP (Basic Metabolic Panel-BMET) (80048-METABOL) TLB-CBC Platelet - w/Differential (85025-CBCD) TLB-Hepatic/Liver Function Pnl (80076-HEPATIC) TLB-TSH (Thyroid Stimulating Hormone) (84443-TSH) TLB-CRP-Full Range (C-Reactive Protein) (86140-FCRP) TLB-A1C / Hgb A1C (Glycohemoglobin) (83036-A1C)  Her updated medication list for this problem includes:    Actoplus Met 15-850 Mg Tabs (Pioglitazone hcl-metformin hcl) .Marland Kitchen... Take one tablet  by mouth twice daily  Labs Reviewed: HgBA1c: 6.7 (07/16/2006)   Creat: 0.9 (07/16/2006)      Problem # 3:  HYPERLIPIDEMIA (ICD-272.4)  Her updated medication list for this problem includes:    Antara 130 Mg Caps (Fenofibrate micronized) .Marland Kitchen... Take one tablet daily    Lipitor 80 Mg Tabs (Atorvastatin calcium) .Marland Kitchen... Take one tablet daily  Orders: Venipuncture (72536) TLB-B12 + Folate Pnl (64403_47425-Z56/LOV) TLB-Lipid Panel (80061-LIPID) TLB-BMP (Basic Metabolic Panel-BMET) (80048-METABOL) TLB-CBC Platelet - w/Differential (85025-CBCD) TLB-Hepatic/Liver Function Pnl (80076-HEPATIC) TLB-TSH (Thyroid Stimulating  Hormone) (84443-TSH) TLB-CRP-Full Range (C-Reactive Protein) (86140-FCRP) TLB-A1C / Hgb A1C (Glycohemoglobin) (83036-A1C)  Labs Reviewed: Chol: 218 (07/16/2006)   HDL: 46.8 (07/16/2006)   LDL: DEL (07/16/2006)   TG: 173 (07/16/2006) SGOT: 23 (07/16/2006)   SGPT: 32 (07/16/2006)   Problem # 4:  HYPERTENSION (ICD-401.9)  Her updated medication list for this problem includes:    Hydrochlorothiazide 25 Mg Tabs (Hydrochlorothiazide) .Marland Kitchen... Take one tab every day    Hydrochlorothiazide 25 Mg Tabs (Hydrochlorothiazide) .Marland Kitchen... Take one tablet daily  Orders: Venipuncture ([address]) TLB-B12 + Folate Pnl (60454_09811-B14/NWG) TLB-Lipid Panel (80061-LIPID) TLB-BMP (Basic Metabolic Panel-BMET) (80048-METABOL) TLB-CBC Platelet - w/Differential (85025-CBCD) TLB-Hepatic/Liver Function Pnl (80076-HEPATIC) TLB-TSH (Thyroid Stimulating Hormone) (84443-TSH) TLB-CRP-Full Range (C-Reactive Protein) (86140-FCRP) TLB-A1C / Hgb A1C (Glycohemoglobin) (83036-A1C)  BP today: 134/80  Labs Reviewed: Creat: 0.9 (07/16/2006) Chol: 218 (07/16/2006)   HDL: 46.8 (07/16/2006)   LDL: DEL (07/16/2006)   TG: 173 (07/16/2006)   Complete Medication List: 1)  Actoplus Met 15-850 Mg Tabs (Pioglitazone hcl-metformin hcl) .... Take one tablet by mouth twice daily 2)  Amitriptyline Hcl 25 Mg Tabs  (Amitriptyline hcl) .... Take one tablet daily 3)  Hydrochlorothiazide 25 Mg Tabs (Hydrochlorothiazide) .... Take one tab every day 4)  Protonix 40 Mg Tbec (Pantoprazole sodium) .... Take one tablet 5)  Diazepam 5 Mg Tabs (Diazepam) .... As needed at bedtime 6)  Antara 130 Mg Caps (Fenofibrate micronized) .... Take one tablet daily 7)  Lipitor 80 Mg Tabs (Atorvastatin calcium) .... Take one tablet daily 8)  Hydrochlorothiazide 25 Mg Tabs (Hydrochlorothiazide) .... Take one tablet daily 9)  Naproxen 500 Mg Tabs (Naproxen) .... Take one tablet twice daily     Prescriptions: NAPROXEN 500 MG  TABS (NAPROXEN) take one tablet twice daily  #60 x 2   Entered and Authorized by:   Loreen Freud DO   Signed by:   Loreen Freud DO on 09/11/2007   Method used:   Electron[facility] sent to ...       [facility]       [address]       [place], [place]  [address]       Ph: [phone] or [phone]       Fax: [phone]   RxID:   [id number] LIPITOR 80 MG  TABS (ATORVASTATIN CALCIUM) take one tablet daily  #30 x 3   Entered and Authorized by:   Loreen Freud DO   Signed by:   Loreen Freud DO on 09/11/2007   Method used:   Electronically sent to ...       [facility]       [address]       [place], [place]  [address]       Ph: [phone] or [phone]       Fax: [phone]   RxID:   [id number] PROTONIX 40 MG  TBEC (PANTOPRAZOLE SODIUM) take one tablet  #30 x 3   Entered and Authorized by:   Loreen Freud DO   Signed by:   Loreen Freud DO on 09/11/2007   Method used:   Electronically sent to ...       [facility]       [address]       [place], [place]  [address]       Ph: [phone] or [phone]  Fax: [phone]   RxID:   [id number] HYDROCHLOROTHIAZIDE 25 MG  TABS (HYDROCHLOROTHIAZIDE) take one tab every day  #30 x 3   Entered and Authorized  by:   Loreen Freud DO   Signed by:   Loreen Freud DO on 09/11/2007   Method used:   Electronically sent to ...       [facility]       [address]       [place], [place]  [address]       Ph: [phone] or [phone]       Fax: [phone]   RxID:   [id number] AMITRIPTYLINE HCL 25 MG TABS (AMITRIPTYLINE HCL) take one tablet daily  #30 x 3   Entered and Authorized by:   Loreen Freud DO   Signed by:   Loreen Freud DO on 09/11/2007   Method used:   Electronically sent to ...       [facility]       [address]       [place], [place]  [address]       Ph: [phone] or [phone]       Fax: [phone]   RxID:   [id number] ACTOPLUS MET 15-850 MG TABS (PIOGLITAZONE HCL-METFORMIN HCL) take one tablet by mouth twice daily  #60 Tablet x 3   Entered and Authorized by:   Loreen Freud DO   Signed by:   Loreen Freud DO on 09/11/2007   Method used:   Electronically sent to ...       [facility]       [address]       [place], [place]  [address]       Ph: [phone] or [phone]       Fax: [phone]   RxID:   [id number]  ]

## 2010-07-26 NOTE — Letter (Signed)
Summary: Results Follow-up Letter  [place] at [facility]  [address] [place], [place] [address]   Phone: [phone]  Fax: [phone]    01/08/2008        [address] [place], [place]  [address]  Dear Ms. Lusk,   The following are the results of your recent test(s):  Test     Result     Pap Smear    Normal_______  Not Normal_____       Comments: _________________________________________________________ Cholesterol LDL(Bad cholesterol):          Your goal is less than:         HDL (Good cholesterol):        Your goal is more than: _________________________________________________________ Other Tests:   _________________________________________________________  Please call for an appointment Or _Please see attached bone density scan results.________________________________________________________ _________________________________________________________ _________________________________________________________  Sincerely,  Ardyth Man Gordon at [facility]

## 2010-07-26 NOTE — Progress Notes (Signed)
Summary: [facility]--rx  Phone Note Refill Request   Refills Requested: Medication #1:  PROTONIX 40 MG  TBEC take one tablet  Medication #2:  AMITRIPTYLINE HCL 25 MG TABS TAKE TWO TABLETS DAILY  Medication #3:  HYDROCHLOROTHIAZIDE 25 MG  TABS take one tab every day  Medication #4:  ACTOPLUS MET 15-850 MG TABS take one tablet by mouth twice daily [facility]--[id number]  Initial call taken by: Freddy Jaksch,  October 12, 2008 2:49 PM      Prescriptions: JANUVIA 100 MG  TABS (SITAGLIPTIN PHOSPHATE) TAKE ONE TABLET DAILY.  #90 x 0   Entered by:   Ardyth Man   Authorized by:   Loreen Freud DO   Signed by:   Ardyth Man on 10/13/2008   Method used:   Faxed to ...       [facility]* (mail-order)             ,          Ph: [phone]       Fax: [phone]   RxID:   [id number] HYDROCHLOROTHIAZIDE 25 MG  TABS (HYDROCHLOROTHIAZIDE) take one tablet daily  #90 x 1   Entered by:   Ardyth Man   Authorized by:   Loreen Freud DO   Signed by:   Ardyth Man on 10/13/2008   Method used:   Faxed to ...       [facility] (mail-order)             ,          Ph: [phone]       Fax: [phone]   RxID:   [id number] PROTONIX 40 MG  TBEC (PANTOPRAZOLE SODIUM) take one tablet  #90 x 0   Entered by:   Ardyth Man   Authorized by:   Loreen Freud DO   Signed by:   Ardyth Man on 10/13/2008   Method used:   Faxed to ...       [facility]* (mail-order)             ,          Ph: [phone]       Fax: [phone]   RxID:   [id number] HYDROCHLOROTHIAZIDE 25 MG  TABS (HYDROCHLOROTHIAZIDE) take one tab every day  #90 x 0   Entered by:   Ardyth Man   Authorized by:   Loreen Freud DO   Signed by:   Ardyth Man on 10/13/2008   Method used:   Faxed to ...       [facility]* (mail-order)             ,          Ph: [phone]       Fax: [phone]   RxID:   [id number] AMITRIPTYLINE HCL 25 MG TABS (AMITRIPTYLINE HCL) TAKE TWO  TABLETS DAILY  #180 x 0   Entered by:   Ardyth Man   Authorized by:   Loreen Freud DO   Signed by:   Ardyth Man on 10/13/2008   Method used:   Faxed to ...       [facility]* (mail-order)             ,          Ph: [phone]       Fax: [phone]   RxID:   [id number] ACTOPLUS MET 15-850 MG TABS (PIOGLITAZONE HCL-METFORMIN HCL) take one tablet by mouth twice daily  #180 x 0  Entered by:   Ardyth Man   Authorized by:   Loreen Freud DO   Signed by:   Ardyth Man on 10/13/2008   Method used:   Faxed to ...       [facility]* (mail-order)             ,          Ph: [phone]       Fax: [phone]   RxID:   [id number]

## 2010-07-26 NOTE — Progress Notes (Signed)
Summary: [facility]---RX  Phone Note Refill Request   Refills Requested: Medication #1:  AMITRIPTYLINE HCL 25 MG TABS TAKE TWO TABLETS DAILY  Medication #2:  ACTOPLUS MET 15-850 MG TABS take one tablet by mouth twice daily  Medication #3:  HYDROCHLOROTHIAZIDE 25 MG  TABS take one tab every day  Medication #4:  PROTONIX 40 MG  TBEC take one tablet [facility]--[id number]  Initial call taken by: Freddy Jaksch,  October 07, 2008 11:57 AM  Follow-up for Phone Call        AMITRIPTYLINE HCL 25 MG TABS last Ov 12-12-07 last filled 09-16-08 #60 pt has pending cpx 11-03-08 is it ok to fill 3 month supply.Marland KitchenMarland KitchenMarland KitchenFelecia Deloach CMA  October 07, 2008 1:41 PM  Additional Follow-up for Phone Call Additional follow up Details #1::        ok to fill untill cpe Additional Follow-up by: Loreen Freud DO,  October 07, 2008 11:52 PM      Prescriptions: PROTONIX 40 MG  TBEC (PANTOPRAZOLE SODIUM) take one tablet  #90 Tablet x 0   Entered by:   Jeremy Johann CMA   Authorized by:   Loreen Freud DO   Signed by:   Jeremy Johann CMA on 10/08/2008   Method used:   Faxed to ...       [facility]* (mail-order)             ,          Ph: [phone]       Fax: [phone]   RxID:   [id number] HYDROCHLOROTHIAZIDE 25 MG  TABS (HYDROCHLOROTHIAZIDE) take one tab every day  #90 x 0   Entered by:   Jeremy Johann CMA   Authorized by:   Loreen Freud DO   Signed by:   Jeremy Johann CMA on 10/08/2008   Method used:   Faxed to ...       [facility]* (mail-order)             ,          Ph: [phone]       Fax: [phone]   RxID:   [id number] ACTOPLUS MET 15-850 MG TABS (PIOGLITAZONE HCL-METFORMIN HCL) take one tablet by mouth twice daily  #180 Tablet x 0   Entered by:   Jeremy Johann CMA   Authorized by:   Loreen Freud DO   Signed by:   Jeremy Johann CMA on 10/08/2008   Method used:   Faxed to ...       [facility] (mail-order)             ,          Ph: [phone]       Fax:  [phone]   RxID:   [id number] AMITRIPTYLINE HCL 25 MG TABS (AMITRIPTYLINE HCL) TAKE TWO TABLETS DAILY  #60 Tablet x 0   Entered by:   Jeremy Johann CMA   Authorized by:   Loreen Freud DO   Signed by:   Jeremy Johann CMA on 10/08/2008   Method used:   Faxed to ...       [facility]* (mail-order)             ,          Ph: [phone]       Fax: [phone]   RxID:   [id number]

## 2010-07-26 NOTE — Letter (Signed)
Summary: Results Follow-up Letter  [place] at [facility]  [address] [place], [place] [address]   Phone: [phone]  Fax: [phone]    12/13/2007        TARI LECOUNT [facility] [address] [place], [place]  [address]  Dear Ms. Alejo,   The following are the results of your recent test(s):  Test     Result     Pap Smear    Normal_______  Not Normal_____       Comments: _________________________________________________________ Cholesterol LDL(Bad cholesterol):          Your goal is less than:         HDL (Good cholesterol):        Your goal is more than: _________________________________________________________ Other Tests:   _________________________________________________________  Please call for an appointment Or PLEASE SEE ATTACHED._________________________________________________________ _________________________________________________________ _________________________________________________________  Sincerely,  Ardyth Man [place] at [facility]

## 2010-07-26 NOTE — Progress Notes (Signed)
Summary: refills  Phone Note Refill Request Call back at [phone] Message from:  Patient  Refills Requested: Medication #1:  ACTOPLUS MET 15-850 MG TABS take 2 tabs by mouth in am and 1 by mouth q dinner  Medication #2:  HYDROCHLOROTHIAZIDE 25 MG  TABS take one tab every day VM left. [facility]...Marland KitchenFelecia Deloach CMA  April 15, 2010 12:00 PM      Prescriptions: ACTOPLUS MET 15-850 MG TABS (PIOGLITAZONE HCL-METFORMIN HCL) take 2 tabs by mouth in am and 1 by mouth q dinner  #180 Tablet x 1   Entered by:   Almeta Monas CMA (AAMA)   Authorized by:   Loreen Freud DO   Signed by:   Almeta Monas CMA (AAMA) on 04/15/2010   Method used:   [facility]        [facility]  [address] [facility]       [address]       [place], [place]  [address]       Ph: [phone] or [phone]       Fax: [phone]   RxID:   [id number]

## 2010-07-26 NOTE — Letter (Signed)
Summary: External Other/[facility]  External Other/[facility]   Imported By: Job Founds 01/21/2007 16:17:58  _____________________________________________________________________  External Attachment:    Type:   Image     Comment:   External Document

## 2010-07-26 NOTE — Progress Notes (Signed)
Summary: Results/ New Meds   Phone Note Outgoing Call   Call placed by: Almeta Monas CMA Duncan Dull),  February 25, 2010 3:04 PM Call placed to: Patient Details for Reason: Results/Med Summary of Call: DM not controlled---- con't meds---add januvia 100 1 by mouth once daily #30  2 refills---coupon in bin Cholesterol not controlled--- Ideally your LDL (bad cholesterol) should be <__70__, your HDL (good cholesterol) should be >_40__ and your triglycerides should be< 150.  Diet and exercise will increase HDL and decrease the LDL and triglycerides. Read Dr. Danice Goltz book--Eat Drink and Be Healthy.  cont' lipitor and tricor---add zetia 10 mg #30  1 by mouth once daily,  2 refills.  We will recheck labs in _3__ months.     250.00  272.4  [facility]  lipid, hep, hgba1c, bmp, microalbumin  alk phos elevated ( part of liver function)---recheck fractionated alk phos  . hep, ggt 790.4  LMTCB. Almeta Monas CMA Duncan Dull)  February 25, 2010 3:05 PM    Follow-up for Phone Call        Pt return call left message to call office..................Marland KitchenFelecia Deloach CMA  March 01, 2010 9:57 AM   Discuss with patient, appt schedule, copy of labs and coupon placed up front for pt to pick-up............Marland KitchenFelecia Deloach CMA  March 01, 2010 10:38 AM     New/Updated Medications: JANUVIA 100 MG TABS (SITAGLIPTIN PHOSPHATE) Take 1 by mouth once daily ZETIA 10 MG TABS (EZETIMIBE) Take 1 by mouth once daily Prescriptions: ZETIA 10 MG TABS (EZETIMIBE) Take 1 by mouth once daily  #30 x 2   Entered by:   Jeremy Johann CMA   Authorized by:   Loreen Freud DO   Signed by:   Jeremy Johann CMA on 03/01/2010   Method used:   [facility]ed to ...       [facility] [address] (retail)       [address]       [place], [place]  [address]       Ph: [phone]       Fax: [phone]   RxID:   [id number] JANUVIA 100 MG TABS (SITAGLIPTIN PHOSPHATE) Take 1 by mouth once daily   #30 x 2   Entered by:   Jeremy Johann CMA   Authorized by:   Loreen Freud DO   Signed by:   Jeremy Johann CMA on 03/01/2010   Method used:   [facility]ed to ...       [facility] [address] [facility]       [address]       [place], [place]  [address]       Ph: [phone]       Fax: [phone]   RxID:   [id number]

## 2010-07-28 NOTE — Progress Notes (Signed)
Summary: diazepam refill  Phone Note Refill Request Message from:  Fax from Pharmacy on June 23, 2010 11:07 AM  Refills Requested: Medication #1:  DIAZEPAM 5 MG  TABS as needed at bedtime [id number], [address], [place], [place]    phone  [phone],     Fax -[phone]     qty = 30     NOTE:  Pharmacy directions state:     "Take one tablet by mouth at bedtime as needed"  Next Appointment Scheduled: none Initial call taken by: Jerolyn Shin,  June 23, 2010 11:11 AM  Follow-up for Phone Call        last seen 06/06/10 and filled 9/21/1.Marland KitchenMarland Kitchenplease advise Follow-up by: Almeta Monas CMA Duncan Dull),  June 23, 2010 11:23 AM  Additional Follow-up for Phone Call Additional follow up Details #1::        ok for #30, no refills Additional Follow-up by: Neena Rhymes MD,  June 23, 2010 11:24 AM    Prescriptions: DIAZEPAM 5 MG  TABS (DIAZEPAM) as needed at bedtime  #30 x 0   Entered by:   Almeta Monas CMA (AAMA)   Authorized by:   Neena Rhymes MD   Signed by:   Almeta Monas CMA (AAMA) on 06/23/2010   Method used:   Printed then faxed to ...       [facility] [address] ([facility]       [address]       [place], [place]  [address]       Ph: [phone]       Fax: [phone]   RxID:   [id number]

## 2010-07-28 NOTE — Letter (Signed)
Summary: [facility]   Imported By: Lanelle Bal 06/11/2010 08:58:09  _____________________________________________________________________  External Attachment:    Type:   Image     Comment:   External Document

## 2010-07-28 NOTE — Progress Notes (Signed)
Summary: Refill  Phone Note Refill Request Call back at [phone] Message from:  Pharmacy on July 21, 2010 8:31 AM  Refills Requested: Medication #1:  AMITRIPTYLINE HCL 25 MG TABS TAKE TWO TABLETS DAILY   Dosage confirmed as above?Dosage Confirmed   Supply Requested: 1 month [facility] [address]  Next Appointment Scheduled: none Initial call taken by: Lavell Islam,  July 21, 2010 8:32 AM  Follow-up for Phone Call        duplicate request Follow-up by: Almeta Monas CMA Duncan Dull),  July 21, 2010 3:57 PM

## 2010-07-28 NOTE — Assessment & Plan Note (Signed)
Summary: diabetes followup and fasting labs///sph   Vital Signs:  Patient profile:   60 year old female Menstrual status:  postmenopausal Weight:      166.8 pounds O2 Sat:      97 % on Room air Temp:     98.1 degrees F oral Pulse rate:   83 / minute Pulse rhythm:   regular BP sitting:   122 / 74  (left arm) Cuff size:   large  Vitals Entered By: Almeta Monas CMA Duncan Dull) (June 06, 2010 8:26 AM)  O2 Flow:  Room air CC: 3 mo DM F/U-- C/O cough and congestion, also requested a fingerstick CBG Result 111   History of Present Illness:  Type 1 diabetes mellitus follow-up      This is a 60 year old woman who presents with Type 2 diabetes mellitus follow-up.  The patient denies polyuria, polydipsia, blurred vision, self managed hypoglycemia, hypoglycemia requiring help, weight loss, weight gain, and numbness of extremities.  The patient denies the following symptoms: neuropathic pain, chest pain, vomiting, orthostatic symptoms, poor wound healing, intermittent claudication, vision loss, and foot ulcer.  Since the last visit the patient reports good dietary compliance, compliance with medications, not exercising regularly, and monitoring blood glucose.  The patient has been measuring capillary blood glucose before breakfast and after dinner.  Since the last visit, the patient reports having had eye care by an ophthalmologist and no foot care.    Current Medications (verified): 1)  Actoplus Met 15-850 Mg Tabs (Pioglitazone Hcl-Metformin Hcl) .... Take 2 Tabs By Mouth in Am and 1 By Mouth Q Dinner 2)  Amitriptyline Hcl 25 Mg Tabs (Amitriptyline Hcl) .... Take Two Tablets Daily 3)  Hydrochlorothiazide 25 Mg  Tabs (Hydrochlorothiazide) .... Take One Tab Every Day 4)  Omeprazole 20 Mg Cpdr (Omeprazole) .Marland Kitchen.. 1 By Mouth Once Daily 5)  Diazepam 5 Mg  Tabs (Diazepam) .... As Needed At Bedtime 6)  Lipitor 80 Mg  Tabs (Atorvastatin Calcium) .... Take One Tablet Daily 7)  Hydrochlorothiazide 25 Mg   Tabs (Hydrochlorothiazide) .... Take One Tablet Daily 8)  Naproxen 500 Mg  Tabs (Naproxen) .... Take One Tablet Twice Daily 9)  Trilipix 135 Mg Cpdr (Choline Fenofibrate) .... Take One By Mouth Every Day. 10)  Vitamin D 91478 Unit Caps (Ergocalciferol) .... Take One Capsule Weekly 11)  Lisinopril 5 Mg Tabs (Lisinopril) .Marland Kitchen.. 1 By Mouth Daily 12)  Januvia 100 Mg Tabs (Sitagliptin Phosphate) .... Take 1 By Mouth Once Daily 13)  Zetia 10 Mg Tabs (Ezetimibe) .... Take 1 By Mouth Once Daily 14)  Veramyst 27.5 Mcg/spray Susp (Fluticasone Furoate) .... 2 Sprays Each Nostril Once Daily 15)  Zithromax Z-Pak 250 Mg Tabs (Azithromycin) .... As Directed  Allergies (verified): No Known Drug Allergies  Past History:  Past Medical History: Last updated: 02/23/2010 GERD Hyperlipidemia Hypertension Diabetes mellitus, type II thyroid nodule Current Problems:  UNSPECIFIED CATARACT (ICD-366.9) POSTMENOPAUSAL STATUS (ICD-627.2) PREVENTIVE HEALTH CARE (ICD-V70.0) FAMILY HISTORY DIABETES 1ST DEGREE RELATIVE (ICD-V18.0) FATIGUE (ICD-780.79) DIABETES MELLITUS, TYPE II (ICD-250.00) FAMILY HISTORY OF CAD FEMALE 1ST DEGREE RELATIVE <50 (ICD-V17.3) DM (ICD-250.00) GLUCOMA (ICD-365.9) THYROID NODULE (ICD-241.0) HYPERTENSION (ICD-401.9) HYPERLIPIDEMIA (ICD-272.4) GERD (ICD-530.81)  Past Surgical History: Last updated: 11/03/2008 Cholecystectomy Carpal tunnel release (2006) Carpal tunnel release (12/24/2007---  Kuzma Cervical fusion (3/08)--Jenkins  Family History: Last updated: 02/23/2010 Family History of CAD Female 1st degree relative <50 Family History Breast Cancer--M 61yo Family History Diabetes 1st degree relative--M and F M--Dementia father==parkinsons dz  Social History: Last updated:  02/23/2010  unemployed Current Smoker Alcohol use-yes Drug use-no Married Regular exercise-yes  Risk Factors: Alcohol Use: <1 (02/23/2010) >5 drinks/d w/in last 3 months: no (02/23/2010) Caffeine  Use: 5 (02/23/2010) Exercise: yes (02/23/2010)  Risk Factors: Smoking Status: current (02/23/2010) Packs/Day: 0.5 (02/23/2010) Passive Smoke Exposure: yes (02/23/2010)  Family History: Reviewed history from 02/23/2010 and no changes required. Family History of CAD Female 1st degree relative <50 Family History Breast Cancer--M 61yo Family History Diabetes 1st degree relative--M and F M--Dementia father==parkinsons dz  Social History: Reviewed history from 02/23/2010 and no changes required.  unemployed Current Smoker Alcohol use-yes Drug use-no Married Regular exercise-yes  Review of Systems      See HPI  Physical Exam  General:  Well-developed,well-nourished,in no acute distress; alert,appropriate and cooperative throughout examination Ears:  External ear exam shows no significant lesions or deformities.  Otoscopic examination reveals clear canals, tympanic membranes are intact bilaterally without bulging, retraction, inflammation or discharge. Hearing is grossly normal bilaterally. Nose:  External nasal examination shows no deformity or inflammation. Nasal mucosa are pink and moist without lesions or exudates. Mouth:  Oral mucosa and oropharynx without lesions or exudates.  Teeth in good repair. Neck:  No deformities, masses, or tenderness noted. Lungs:  occassional Heart:  normal rate and no murmur.   Cervical Nodes:  No lymphadenopathy noted Psych:  Oriented X3 and normally interactive.    Diabetes Management Exam:    Foot Exam (with socks and/or shoes not present):       Sensory-Pinprick/Light touch:          Left medial foot (L-4): normal          Left dorsal foot (L-5): normal          Left lateral foot (S-1): normal          Right medial foot (L-4): normal          Right dorsal foot (L-5): normal          Right lateral foot (S-1): normal       Sensory-Monofilament:          Left foot: normal          Right foot: normal       Inspection:          Left foot:  normal          Right foot: normal       Nails:          Left foot: normal          Right foot: normal    Eye Exam:       Eye Exam done elsewhere          Date: 05/31/2010          Results: normal          Done by: Hazle Quant   Impression & Recommendations:  Problem # 1:  BRONCHITIS- ACUTE (ICD-466.0)  Her updated medication list for this problem includes:    Zithromax Z-pak 250 Mg Tabs (Azithromycin) .Marland Kitchen... As directed  Take antibiotics and other medications as directed. Encouraged to push clear liquids, get enough rest, and take acetaminophen as needed. To be seen in 5-7 days if no improvement, sooner if worse.  Problem # 2:  DIABETES MELLITUS, TYPE II (ICD-250.00)  Her updated medication list for this problem includes:    Actoplus Met 15-850 Mg Tabs (Pioglitazone hcl-metformin hcl) .Marland Kitchen... Take 2 tabs by mouth in am and 1 by mouth q dinner  Lisinopril 5 Mg Tabs (Lisinopril) .Marland Kitchen... 1 by mouth daily    Januvia 100 Mg Tabs (Sitagliptin phosphate) .Marland Kitchen... Take 1 by mouth once daily  Orders: Venipuncture (91478) TLB-Lipid Panel (80061-LIPID) TLB-BMP (Basic Metabolic Panel-BMET) (80048-METABOL) TLB-Hepatic/Liver Function Pnl (80076-HEPATIC) TLB-A1C / Hgb A1C (Glycohemoglobin) (83036-A1C) TLB-Microalbumin/Creat Ratio, Urine (82043-MALB) Specimen Handling (29562) Podiatry Referral (Podiatry)  Labs Reviewed: Creat: 0.7 (02/23/2010)     Last Eye Exam: normal (05/31/2010) Reviewed HgBA1c results: 7.0 (02/23/2010)  6.9 (07/14/2009)  Complete Medication List: 1)  Actoplus Met 15-850 Mg Tabs (Pioglitazone hcl-metformin hcl) .... Take 2 tabs by mouth in am and 1 by mouth q dinner 2)  Amitriptyline Hcl 25 Mg Tabs (Amitriptyline hcl) .... Take two tablets daily 3)  Hydrochlorothiazide 25 Mg Tabs (Hydrochlorothiazide) .... Take one tab every day 4)  Omeprazole 20 Mg Cpdr (Omeprazole) .Marland Kitchen.. 1 by mouth once daily 5)  Diazepam 5 Mg Tabs (Diazepam) .... As needed at bedtime 6)  Lipitor 80 Mg  Tabs (Atorvastatin calcium) .... Take one tablet daily 7)  Hydrochlorothiazide 25 Mg Tabs (Hydrochlorothiazide) .... Take one tablet daily 8)  Naproxen 500 Mg Tabs (Naproxen) .... Take one tablet twice daily 9)  Trilipix 135 Mg Cpdr (Choline fenofibrate) .... Take one by mouth every day. 10)  Vitamin D 13086 Unit Caps (Ergocalciferol) .... Take one capsule weekly 11)  Lisinopril 5 Mg Tabs (Lisinopril) .Marland Kitchen.. 1 by mouth daily 12)  Januvia 100 Mg Tabs (Sitagliptin phosphate) .... Take 1 by mouth once daily 13)  Zetia 10 Mg Tabs (Ezetimibe) .... Take 1 by mouth once daily 14)  Veramyst 27.5 Mcg/spray Susp (Fluticasone furoate) .... 2 sprays each nostril once daily 15)  Zithromax Z-pak 250 Mg Tabs (Azithromycin) .... As directed  Patient Instructions: 1)  Acute Bronchitis symptoms for less then 10 days are not  helped by antibiotics. Take over the counter cough medications. Call if no improvement in 5-7 days, sooner if increasing cough, fever, or new symptoms ( shortness of breath, chest pain) .  Prescriptions: ZITHROMAX Z-PAK 250 MG TABS (AZITHROMYCIN) as directed  #1 x 0   Entered and Authorized by:   Loreen Freud DO   Signed by:   Loreen Freud DO on 06/06/2010   Method used:   Electronically to        [facility] (retail)       [address]       [place], [place]  [address]       Ph: [phone]       Fax: [phone]   RxID:   [id number]    Orders Added: 1)  Venipuncture [87564] 2)  TLB-Lipid Panel [80061-LIPID] 3)  TLB-BMP (Basic Metabolic Panel-BMET) [80048-METABOL] 4)  TLB-Hepatic/Liver Function Pnl [80076-HEPATIC] 5)  TLB-A1C / Hgb A1C (Glycohemoglobin) [83036-A1C] 6)  TLB-Microalbumin/Creat Ratio, Urine [82043-MALB] 7)  Specimen Handling [99000] 8)  Podiatry Referral [Podiatry] 9)  Est. Patient Level III [99213]    Laboratory Results   Blood Tests     CBG Random:: 111mg /dL

## 2010-07-29 NOTE — Consult Note (Signed)
Summary: [facility]  [facility] [place]   Imported By: Lanelle Bal 04/02/2008 11:20:30  _____________________________________________________________________  External Attachment:    Type:   Image     Comment:   External Document

## 2010-08-22 ENCOUNTER — Encounter: Payer: Self-pay | Admitting: Family Medicine

## 2010-09-01 NOTE — Letter (Signed)
Summary: [facility]   Imported By: Maryln Gottron 08/25/2010 14:08:51  _____________________________________________________________________  External Attachment:    Type:   Image     Comment:   External Document

## 2010-09-01 NOTE — Consult Note (Signed)
Summary: [facility]   Imported By: Maryln Gottron 08/25/2010 14:07:42  _____________________________________________________________________  External Attachment:    Type:   Image     Comment:   External Document

## 2010-10-18 ENCOUNTER — Other Ambulatory Visit: Payer: Self-pay | Admitting: Family Medicine

## 2010-10-24 ENCOUNTER — Other Ambulatory Visit: Payer: Self-pay | Admitting: Family Medicine

## 2010-10-24 ENCOUNTER — Other Ambulatory Visit: Payer: Self-pay

## 2010-10-24 DIAGNOSIS — F419 Anxiety disorder, unspecified: Secondary | ICD-10-CM

## 2010-10-24 NOTE — Telephone Encounter (Signed)
RX completed. 

## 2010-10-24 NOTE — Telephone Encounter (Signed)
Rx for Valium faxed to pharmacy     KP

## 2010-10-24 NOTE — Telephone Encounter (Signed)
Refill x1 

## 2010-11-08 NOTE — Op Note (Signed)
Alyssa Ford, CINNAMON               ACCOUNT NO.:  [id number]   MEDICAL RECORD NO.:  [id number]          PATIENT TYPE:  AMB   LOCATION:  DSC                          FACILITY:  [facility]   PHYSICIAN:  Cindee Salt, M.D.       DATE OF BIRTH:  17-Dec-1950   DATE OF PROCEDURE:  12/24/2007  DATE OF DISCHARGE:                               OPERATIVE REPORT   PREOPERATIVE DIAGNOSIS:  Carpal tunnel syndrome, left hand.   POSTOPERATIVE DIAGNOSIS:  Carpal tunnel syndrome, left hand.   OPERATION:  Decompression left median nerve.   SURGEON:  Cindee Salt, MD   ANESTHESIA:  Forearm based IV regional.   DATE OF OPERATION:  December 24, 2007   ANESTHESIOLOGIST:  Zenon Mayo, MD.   HISTORY:  The patient is a 60 year old female with a history of carpal  tunnel syndrome, EMG nerve conductions positive.  This has not responded  to conservative treatment.  She has elected to undergo a surgical  decompression.  The pre, peri, and postoperative course have been  discussed along with the risks and complications.  She is aware that  there is no guarantee with the surgery, possibly of infection,  recurrence injury to arteries, nerves, tendons incomplete relief of  symptoms, and dystrophy in the preoperative area.  The patient was seen.  The extremity marked by both the patient and surgeon.  Antibiotic given.   PROCEDURE:  The patient was brought to the arm brought to the operating  room with forearm based IV regional anesthetic was carried out without  difficulty.  She was prepped using DuraPrep, supine position, left arm  free.  A time-out was taken.  A longitudinal incision was then made in  the palm, carried down through subcutaneous tissue.  Bleeders were  electrocauterized.  Palmar fascia was split.  Superficial palmar arch  identified.  The flexor tendon to the ring and little finger identified.  To the ulnar side of the median nerve, the carpal retinaculum was  incised with sharp dissection.  A  right angle and Sewall retractor  placed between skin and forearm fascia.  The fascia released for  approximately a centimeter and a half proximal to the wrist crease under  direct vision.  Canal was explored.  Area of compression to the median  nerve was apparent.  No further lesions were identified.  The wound was  irrigated.  The  skin was then closed with interrupted 5-0 Vicryl Rapide sutures.  A  sterile compressive dressing and wrist splint was applied.  The patient  tolerated the procedure well and was taken to the recovery room for  observation in satisfactory condition.  She will be discharged to home  to return to the [facility] [place] in 1 week on Talwin NX.           ______________________________  Cindee Salt, M.D.     GK/MEDQ  D:  12/24/2007  T:  12/25/2007  Job:  [id number]   cc:   Felisa Bonier, MD.

## 2010-11-08 NOTE — Assessment & Plan Note (Signed)
[facility]                               LIPID CLINIC NOTE   DANNA, SEWELL                      MRN:          [id number]  DATE:10/17/2007                            DOB:          11/17/1950    [name] was previously seen in this clinic for her hyperlipidemia  therapy up to 2006.  She returns now to resume follow up.  Current  cholesterol medications include Lipitor 80 mg daily.  She has been  tolerating this just fine, and she has been compliant.  She does  complain of some aches and pains, but they seem to be most likely due to  her osteoarthritis, and she has been on Lipitor for quite some time, and  her aches and pains are not too problematic.  Her other medications  include HCTZ, Naproxen, and Elavil, Protonix, Avandamet, Bupropion XL,  Travatan, and aspirin 81 mg.   PHYSICAL EXAMINATION:  Includes blood pressure 138/66, heart rate is 92.   LABORATORY DATA:  Includes a total cholesterol of 170, triglycerides  159, HDL 39, LDL 99.  Liver function tests are within normal limits.   ASSESSMENT:  Alyssa Ford has shown improvement from her lipid panel that  we had taken back in 2006.  Her LDL is 99 which is good but not ideal  due to her diabetic status which brings her goal LDL to less than 70.  Her HDL and triglycerides are not at goal but are very close.  As far as  diet goes, her breakfast usually consists of croissant rolls, yogurt.  On the weekends, she does splurge and has biscuits and gravy with bacon.  During the week though, most commonly bagels or oatmeal.  Lunch, she  does admit to going out to [employer] occasionally.  Otherwise, she  will have a salad or ham or [place] sandwiches.  Dinner is often  barbecued or grilled chicken, green beans, corn, macaroni and cheese,  salad, hot dogs occasionally.  She does enjoy having ice cream at night.  As far as exercise, she walks.  She does not do any specific exercise  regimen but  does take care of her mother who has moved in with her and  keeps it quite active throughout the day.  She continues to smoke  cigarettes, less than one pack per day, and she is trying to cut down.  She feels being on the Wellbutrin is helping her deal with her cravings  and bad mood.  She drinks alcohol occasionally.  Just drinks on the  weekends for social situations.   PLAN:  For now, we are going to continue with her Lipitor 80 mg once  daily.  We may, since this is the maximum dose of Lipitor, we may  consider changing her to Crestor if her lipid numbers are not at goal on  her next visit.  We encouraged her to increase exercise by instituting  daily walks, 20 to 30 minutes at a time to really get her heart rate up.  We encouraged her to try to follow more of a heart-healthy diet,  cutting  down on desserts and ice cream.  Cutting out or reducing trips to  [employer].  Follow up with her was scheduled for 16 weeks with a  repeat  lipid and liver panel and will make any adjustments at that time that  are necessary.  This patient was seen with Oleta Mouse, Pharm.D.  Candidate.      Charolotte Eke, PharmD  Electronically Signed      Rollene Rotunda, MD, [facility]  Electronically Signed   TP/MedQ  DD: 10/17/2007  DT: 10/17/2007  Job #: [id number]

## 2010-11-11 NOTE — Op Note (Signed)
Alyssa Ford, Alyssa Ford               ACCOUNT NO.:  [id number]   MEDICAL RECORD NO.:  [id number]          PATIENT TYPE:  AMB   LOCATION:  DSC                          FACILITY:  [facility]   PHYSICIAN:  Cindee Salt, M.D.       DATE OF BIRTH:  1951/06/14   DATE OF PROCEDURE:  04/19/2005  DATE OF DISCHARGE:                                 OPERATIVE REPORT   PREOPERATIVE DIAGNOSIS:  Carpal tunnel syndrome, right hand.   POSTOPERATIVE DIAGNOSIS:  Carpal tunnel syndrome, right hand.   OPERATION:  Decompression of right median nerve.   SURGEON:  Cindee Salt, M.D.   ASSISTANT:  Alfredo Bach R.N.   ANESTHESIA:  Forearm-based IV regional.   HISTORY:  The patient is a 60 year old female with a history of carpal  tunnel syndrome, EMG and nerve conductions positive, which has not responded  to conservative treatment.   PROCEDURE:  The patient was brought to the operating room where a forearm-  based IV regional anesthetic was carried out without difficulty.  She was  prepped using DuraPrep, supine position, right arm free.  A longitudinal  incision was made in the palm and carried down through subcutaneous tissue;  bleeders were electrocauterized.  Palmar fascia was split, superficial  palmar arch identified, flexor tendon to the ring and little finger  identified.  To the ulnar side of the median nerve, the carpal retinaculum  was incised with sharp dissection.  A right-angle and Sewall retractor were  placed between skin and forearm fascia.  The fascia was released for  approximately a centimeter and a half proximal to the wrist crease under  direct vision.  Canal was explored and no further lesions were identified.  The area of compression of the nerve was apparent.  No further lesions were  identified.  The wound was irrigated. The skin was closed with interrupted 5-  0 nylon sutures.  A sterile compressive dressing and splint were applied.  The patient tolerated the procedure well and was taken  to the recovery room  for observation in satisfactory condition.   She is discharged home to return to the [facility] [place] in 1 week  on Talwin NX.           ______________________________  Cindee Salt, M.D.     GK/MEDQ  D:  04/19/2005  T:  04/19/2005  Job:  [id number]

## 2010-11-11 NOTE — Procedures (Signed)
[facility]  Patient:    Alyssa Ford, Alyssa Ford                      MRN: [id number] Proc. Date: 12/09/99 Adm. Date:  [id number] Disc. Date: [id number] Attending:  Judeth Cornfield CC:         Angelena Sole, M.D. [facility]                           Procedure Report  PROCEDURE: Endoscopic retrograde cholangiopancreatography.  HISTORY OF PRESENT ILLNESS:  Ms. [name] has cholestatic liver tests. She denies abdominal pain, she is status post cholecystectomy. Recent ultrasound demonstrated borderline dilated bile duct. Test is performed to rule out choledocholithiasis.  INFORMED CONSENT:  The patient provided consent after risks, benefits, and alternatives were explained.  MEDICATIONS:  Versed 8, fentanyl 75, Robinul 0.2 mg IV and cetacaine spray.  DESCRIPTION OF PROCEDURE:  The patient was placed in the prone position and administered continuous low flow oxygen and was placed on pulse oximetry. The Olympus video duodenoscope was inserted blindly into the oropharynx and esophagus. The scope was then passed through the stomach and on to the duodenum where a normal appearing papilla was identified.  The bile duct was selectively cannulated. The common bile duct was slightly dilated measuring approximately 10-11 mm. The common hepatic duct and intrahepatic ducts were entirely normal. There was prompt emptying of contrast dye.  IMPRESSION:  Normal ERCP (minimal bile duct dilatation post cholecystectomy).  RECOMMENDATION:  Check AMA and ANA levels. DD:  12/09/99 TD:  12/13/99 Job: [id number]

## 2010-11-11 NOTE — Op Note (Signed)
NAMEJOIE, HIPPS               ACCOUNT NO.:  [id number]   MEDICAL RECORD NO.:  [id number]          PATIENT TYPE:  INP   LOCATION:  3008                         FACILITY:  [facility]   PHYSICIAN:  Cristi Loron, M.D.DATE OF BIRTH:  1950-09-11   DATE OF PROCEDURE:  09/17/2006  DATE OF DISCHARGE:                               OPERATIVE REPORT   BRIEF HISTORY:  The patient is a 60 year old white female who has  suffered from neck and left arm pain.  The patient failed medical  management and was worked up with a cervical MRI which demonstrated the  patient had a large herniated disk at C5-6 on the left as well as  significant spondylosis at C4-5.  I discussed the various treatment  options with the patient including surgery.  The patient has weighed the  risks, benefits and alternatives to surgery and decided to proceed with  a C4-5 and C5-6 anterior cervical diskectomy, fusion and plating.   PREOPERATIVE DIAGNOSIS:  C4-5 spondylosis stenosis, cervical  radiculopathy, cervicalgia; C5-6 herniated nucleus pulposus, stenosis,  cervicalgia, cervical radiculopathy.   POSTOPERATIVE DIAGNOSIS:  C4-5 spondylosis stenosis, cervical  radiculopathy, cervicalgia; C5-6 herniated nucleus pulposus, stenosis,  cervicalgia, cervical radiculopathy.   PROCEDURE:  Is C4-5 and C5-6 extensive anterior cervical  diskectomy/decompression; C4-5 and C5-6 interbody local autograft  arthrodesis; C4-5 and C5-6 insertion of interbody prosthesis (Alphatec  PEEK interbody prosthesis); C4-C6 anterior cervical instrumentation with  Codman slim-lock anterior cervical plate and screws.   SURGEON:  Dr. Delma Officer.   ASSISTANT:  Is Dr. Maeola Harman.   ANESTHESIA:  General endotracheal.   ESTIMATED BLOOD LOSS:  100 mL.   SPECIMENS:  None.   DRAINS:  None.   COMPLICATIONS:  None.   DESCRIPTION OF PROCEDURE:  The patient was brought to the operating room  by the anesthesia team.  General endotracheal  anesthesia was induced.  The patient remained in supine position.  A roll was placed under her  shoulders to place her neck in slight extension.  Her anterior cervical  region was then prepared with Betadine scrub and Betadine solution.  Sterile drapes were applied.  I then injected the area to be incised  with Marcaine with epinephrine solution and used a scalpel to make a  transverse incision in the patient's left anterior neck.  I used  Metzenbaum scissors to divide the platysmal muscle and then to dissect  medial to the sternocleidomastoid muscle to jugular vein and then  carotid artery.  I carefully dissected down towards the anterior  cervical spine, identifying the esophagus and retracting medially.  I  then used the Kidner swabs to clear soft tissue from the entrance of the  upper exposed intervertebral disk space.  We obtained intraoperative  radiograph to confirm our location.   We then used electrocautery to detach the medial border of the longus  colli muscle bilaterally from C4-5 to C5-6 intervertebral disk space.  We inserted the Caspar self-retaining retractor for exposure.  We then  incised the on 5-6 intervertebral disk with a 15-blade scalpel for a  partial intervertebral diskectomy with the Carlen curettes  and the  pituitary forceps.  We then inserted distraction screws C5-6, distracted  the interspace and then used a high-speed drill to decorticate the  vertebral endplates at C5-6, drilled away the remainder of C5-6  intervertebral disk to remove some posterior spondylosis and to thin out  the posterior longitudinal ligament.  We then incised the thinned out  ligament with an arachnoid knife and then removed it with a Kerrison  punch undercutting the vertebral endplates at C5-C6, decompressing the  thecal sac, and we then performed a foraminotomy about the bilateral C6  nerve root, completing the decompression at this level.  Of note, we  encountered as expected a  left herniated disk at C5-6, compressing the  left C6 nerve root.   We then repeated this procedure in analogous fashion at C4-5,  decompressing the thecal sac and bilateral C5 nerve roots.   Having completed the decompression, we now turned our attention to  arthrodesis.  We used the trial spacers and determined to use Alphatec  medium interbody spacers.  We prefilled these spacers with a combination  of Vitoss bone graft extender as well as local autograft bone.  We  obtained during the decompression.  We then inserted the prefilled  prosthesis into the distractions at C4-5 and C5-6 intervertebral space.  We then removed the distraction screws.  There was a good snug fit of  the prosthesis at both levels.   We now turned our attention to anterior spinal fixation.  We used a high-  speed drill to decorticate the vertebral endplates at C4-5, C5-6 so that  the plate would lay down flat.  We then selected the appropriate length  Codman Slim-LOC anterior cervical plate and laid it along the anterior  aspect of vertebral bodies from C4-C6.  We then drilled two 12-mm holes  at C4, C5 and C6.  We secured the plate into the vertebral bodies by  placing two 12-mm self-tapping screws at C4, C5 and C6.  We obtained  intraoperative radiograph that demonstrated good position of the plate,  screws and interbody prosthesis with limited visualization of the lower  plate and screws because of the patient's body habitus.  We secured  these screws to the plate by locking each cam.  We then irrigated the  wound out with bacitracin solution, removed the retractor.  We then  inspected the esophagus for any damage, and there was none apparent.  We  then reapproximated the patient's platysma muscle with interrupted 3-0  Vicryl suture, subcutaneous tissue with interrupted 3-0 Vicryl suture  and the skin with Steri-Strips and Benzoin.  The wound was then coated with bacitracin ointment.  A sterile dressing was  applied, the drapes  were removed, and the patient was subsequently extubated by anesthesia  team and transported to the post-anesthesia care unit in stable  condition.  All sponge, instrument and needle counts were correct at the  end of this case.      Cristi Loron, M.D.  Electronically Signed     JDJ/MEDQ  D:  09/17/2006  T:  09/18/2006  Job:  [id number]

## 2010-11-11 NOTE — Discharge Summary (Signed)
[facility]  Patient:    Alyssa Ford, Alyssa Ford                        MRN: [id number] Adm. Date:  01/20/00 Disc. Date: 01/23/00 Attending:  Pricilla Riffle. Trisha Mangle, M.D. [facility] Dictator:   Cornell Barman, P.A. CC:         Dr. ______                           Discharge Summary  DISCHARGE DIAGNOSES: 1. Fever. 2. Elevated sedimentation rate. 3. Leukocytosis.  REASON FOR ADMISSION:  [name] is a 60 year old white female who was admitted with fever of unknown origin.  On January 16, 2000, the patient called the office with persistent complaints of fever and was started on Cipro 500 mg b.i.d.  Since that time she has had a fever up to 104.  A CT of the abdomen and pelvis was performed on January 19, 2000.  This revealed left ovarian cyst that was old and had been seen by Dr. Greta Doom.  No abscess or diverticulitis. The patient was also noted to have a white count of 2200.  Sedimentation rate was 118.  The patient was admitted for further evaluation.  HOSPITAL COURSE: #1 - FEVER OF UNKNOWN ORIGIN:  The patient was admitted to in part rule out endocarditis.  She was empirically started on ampicillin and gentamicin.  The patient had a 2-D echocardiogram that did not show any evidence of vegetation. The patient remained afebrile and her white count dropped from 12,000 to 8000. The patients antibiotics were changed to Tequin.  Since her admission, the patients urinalysis is negative.  Blood cultures both routine and for fungus have been negative thus far.  Chest x-ray is without infiltrate.  Limited CT of the sinuses was negative.  MRI of the LS spine was negative for abscess. No definite etiology for infection has been identified.  We will have the patient to complete a seven-day course of antibiotics.  #2 - ELEVATED SEDIMENTATION RATE:  There is also no evidence of a rheumatologic disorder.  The patients ANA from her previous hospitalization was negative.  The patients  rheumatoid factor is also negative.  Most likely etiology is secondary to her fever.  DISCHARGE LABORATORY DATA:  Sedimentation rate was 150. ______  MEDICATIONS AT DISCHARGE:  The patient has been instructed to resume her home medications as well as Tequin 400 mg q.d. for three days.  INSTRUCTIONS:  The patient is to check her temperatures in the morning and in the late afternoon and followup with Dr. ______ on Thursday, August 7. DD:  01/23/00 TD:  01/24/00 Job: [id number]

## 2010-11-15 ENCOUNTER — Ambulatory Visit: Payer: Self-pay | Admitting: Family Medicine

## 2010-11-16 ENCOUNTER — Encounter: Payer: Self-pay | Admitting: Family Medicine

## 2010-11-17 ENCOUNTER — Ambulatory Visit (INDEPENDENT_AMBULATORY_CARE_PROVIDER_SITE_OTHER): Payer: BC Managed Care – PPO | Admitting: Family Medicine

## 2010-11-17 ENCOUNTER — Encounter: Payer: Self-pay | Admitting: Family Medicine

## 2010-11-17 DIAGNOSIS — F3289 Other specified depressive episodes: Secondary | ICD-10-CM

## 2010-11-17 DIAGNOSIS — F419 Anxiety disorder, unspecified: Secondary | ICD-10-CM

## 2010-11-17 DIAGNOSIS — E119 Type 2 diabetes mellitus without complications: Secondary | ICD-10-CM

## 2010-11-17 DIAGNOSIS — F411 Generalized anxiety disorder: Secondary | ICD-10-CM

## 2010-11-17 DIAGNOSIS — F17200 Nicotine dependence, unspecified, uncomplicated: Secondary | ICD-10-CM

## 2010-11-17 DIAGNOSIS — E785 Hyperlipidemia, unspecified: Secondary | ICD-10-CM

## 2010-11-17 DIAGNOSIS — F32A Depression, unspecified: Secondary | ICD-10-CM

## 2010-11-17 DIAGNOSIS — F329 Major depressive disorder, single episode, unspecified: Secondary | ICD-10-CM

## 2010-11-17 DIAGNOSIS — Z720 Tobacco use: Secondary | ICD-10-CM

## 2010-11-17 DIAGNOSIS — I10 Essential (primary) hypertension: Secondary | ICD-10-CM

## 2010-11-17 DIAGNOSIS — E041 Nontoxic single thyroid nodule: Secondary | ICD-10-CM

## 2010-11-17 DIAGNOSIS — G4700 Insomnia, unspecified: Secondary | ICD-10-CM

## 2010-11-17 LAB — LIPID PANEL
Cholesterol: 167 mg/dL (ref 0–200)
HDL: 41.3 mg/dL (ref >39.00)
LDL Cholesterol: 91 mg/dL (ref 0–99)
Total CHOL/HDL Ratio: 4
Triglycerides: 174 mg/dL — ABNORMAL HIGH (ref 0.0–149.0)
VLDL: 34.8 mg/dL (ref 0.0–40.0)

## 2010-11-17 LAB — BASIC METABOLIC PANEL
BUN: 10 mg/dL (ref 6–23)
CO2: 30 mEq/L (ref 19–32)
Calcium: 9.6 mg/dL (ref 8.4–10.5)
Chloride: 93 mEq/L — ABNORMAL LOW (ref 96–112)
Creatinine, Ser: 0.6 mg/dL (ref 0.4–1.2)
GFR: 104.13 mL/min (ref >60.00)
Glucose, Bld: 117 mg/dL — ABNORMAL HIGH (ref 70–99)
Potassium: 4.3 mEq/L (ref 3.5–5.1)
Sodium: 132 mEq/L — ABNORMAL LOW (ref 135–145)

## 2010-11-17 LAB — POCT URINALYSIS DIPSTICK
Bilirubin, UA: NEGATIVE
Blood, UA: NEGATIVE
Glucose, UA: NEGATIVE
Ketones, UA: NEGATIVE
Leukocytes, UA: NEGATIVE
Nitrite, UA: NEGATIVE
Protein, UA: NEGATIVE
Spec Grav, UA: 1.025
Urobilinogen, UA: 0.2
pH, UA: 6.5

## 2010-11-17 LAB — TSH: TSH: 1.79 u[IU]/mL (ref 0.35–5.50)

## 2010-11-17 LAB — MICROALBUMIN / CREATININE URINE RATIO
Creatinine,U: 47.7 mg/dL
Microalb Creat Ratio: 6.1 mg/g (ref 0.0–30.0)
Microalb, Ur: 2.9 mg/dL — ABNORMAL HIGH (ref 0.0–1.9)

## 2010-11-17 LAB — HEPATIC FUNCTION PANEL
ALT: 43 U/L — ABNORMAL HIGH (ref 0–35)
AST: 28 U/L (ref 0–37)
Albumin: 3.9 g/dL (ref 3.5–5.2)
Alkaline Phosphatase: 106 U/L (ref 39–117)
Bilirubin, Direct: 0.1 mg/dL (ref 0.0–0.3)
Total Bilirubin: 0.4 mg/dL (ref 0.3–1.2)
Total Protein: 7.1 g/dL (ref 6.0–8.3)

## 2010-11-17 LAB — HEMOGLOBIN A1C: Hgb A1c MFr Bld: 6.9 % — ABNORMAL HIGH (ref 4.6–6.5)

## 2010-11-17 MED ORDER — ATORVASTATIN CALCIUM 80 MG PO TABS: 80.0000 mg | ORAL_TABLET | Freq: Every day | ORAL | Status: DC

## 2010-11-17 MED ORDER — LISINOPRIL 5 MG PO TABS: 5.0000 mg | ORAL_TABLET | Freq: Every day | ORAL | Status: DC

## 2010-11-17 MED ORDER — METFORMIN HCL 850 MG PO TABS: ORAL_TABLET | ORAL | Status: DC

## 2010-11-17 MED ORDER — CHOLINE FENOFIBRATE 135 MG PO CPDR: 135.0000 mg | DELAYED_RELEASE_CAPSULE | Freq: Every day | ORAL | Status: DC

## 2010-11-17 MED ORDER — DIAZEPAM 5 MG PO TABS: 5.0000 mg | ORAL_TABLET | Freq: Every evening | ORAL | Status: DC | PRN

## 2010-11-17 MED ORDER — BUPROPION HCL ER (XL) 150 MG PO TB24: ORAL_TABLET | ORAL | Status: DC

## 2010-11-17 MED ORDER — AMITRIPTYLINE HCL 25 MG PO TABS: ORAL_TABLET | ORAL | Status: DC

## 2010-11-17 MED ORDER — SITAGLIPTIN PHOSPHATE 100 MG PO TABS: 100.0000 mg | ORAL_TABLET | Freq: Every day | ORAL | Status: DC

## 2010-11-17 MED ORDER — HYDROCHLOROTHIAZIDE 25 MG PO TABS: 25.0000 mg | ORAL_TABLET | Freq: Every day | ORAL | Status: DC

## 2010-11-17 MED ORDER — EZETIMIBE 10 MG PO TABS: 10.0000 mg | ORAL_TABLET | Freq: Every day | ORAL | Status: DC

## 2010-11-17 NOTE — Assessment & Plan Note (Signed)
con't meds Check labs today Pt given new monitor

## 2010-11-17 NOTE — Progress Notes (Signed)
  Subjective:    Patient ID: Alyssa Ford, female    DOB: April 11, 1951, 60 y.o.   MRN: [id number]  HPI HYPERTENSION Disease Monitoring Blood pressure range --not checking at home,  Good at [employer] Chest pain- no    Dyspnea- no Medications Compliance- good Lightheadedness- no Edema- no   DIABETES Disease Monitoring Blood Sugar ranges- 125-130 Polyuria- no New Visual problems- no Medications Compliance- good Hypoglycemic symptoms- none  HYPERLIPIDEMIA Disease Monitoring See symptoms for Hypertension Medications Compliance- good RUQ pain- no Muscle aches- none  ROS See HPI above   PMH Smoking Status noted    Review of Systems See above    Objective:   Physical Exam  Constitutional: She is oriented to person, place, and time. She appears well-developed and well-nourished.  HENT:  Right Ear: External ear normal.  Left Ear: External ear normal.  Mouth/Throat: Oropharynx is clear and moist.  Eyes: EOM are normal.  Neck: Normal range of motion. Neck supple.  Cardiovascular: Normal rate, regular rhythm and normal heart sounds.   No murmur heard. Pulmonary/Chest: Effort normal and breath sounds normal. No respiratory distress. She has no wheezes. She has no rales.  Musculoskeletal: She exhibits no edema and no tenderness.  Neurological: She is alert and oriented to person, place, and time.  Psychiatric: She has a normal mood and affect. Her behavior is normal.  Sensory exam of the foot is normal, tested with the monofilament. Good pulses, no lesions or ulcers, good peripheral pulses.        Assessment & Plan:

## 2010-11-17 NOTE — Assessment & Plan Note (Signed)
con't meds  Check labs 

## 2010-11-17 NOTE — Patient Instructions (Signed)
Smoking Cessation This document explains the best ways for you to quit smoking and new treatments to help. It lists new medicines that can double or triple your chances of quitting and quitting for good. It also considers ways to avoid relapses and concerns you may have about quitting, including weight gain. NICOTINE: A POWERFUL ADDICTION If you have tried to quit smoking, you know how hard it can be. It is hard because nicotine is a very addictive drug. For some people, it can be as addictive as heroin or cocaine. Usually, people make 2 or 3 tries, or more, before finally being able to quit. Each time you try to quit, you can learn about what helps and what hurts. Quitting takes hard work and a lot of effort, but you can quit smoking. QUITTING SMOKING IS ONE OF THE MOST IMPORTANT THINGS YOU WILL EVER DO:  You will live longer, feel better, and live better.   The impact on your body of quitting smoking is felt almost immediately:   Within 20 minutes, blood pressure decreases. Pulse returns to its normal level.   After 8 hours, carbon monoxide levels in the blood return to normal. Oxygen level increases.   After 24 hours, chance of heart attack starts to decrease. Breath, hair, and body stop smelling like smoke.   After 48 hours, damaged nerve endings begin to recover. Sense of taste and smell improve.   After 72 hours, the body is virtually free of nicotine. Bronchial tubes relax and breathing becomes easier.   After 2 to 12 weeks, lungs can hold more air. Exercise becomes easier and circulation improves.   Quitting will lower your chance of having a heart attack, stroke, cancer, or lung disease:   After 1 year, the risk of coronary heart disease is cut in half.   After 5 years, the risk of stroke falls to the same as a nonsmoker.   After 10 years, the risk of lung cancer is cut in half and the risk of other cancers decreases significantly.   After 15 years, the risk of coronary heart  disease drops, usually to the level of a nonsmoker.   If you are pregnant, quitting smoking will improve your chances of having a healthy baby.   The people you live with, especially your children, will be healthier.   You will have extra money to spend on things other than cigarettes.  FIVE KEYS TO QUITTING Studies have shown that these 5 steps will help you quit smoking and quit for good. You have the best chances of quitting if you use them together: 1. Get ready.  2. Get support and encouragement.  3. Learn new skills and behaviors.  4. Get medicine to reduce your nicotine addiction and use it correctly.  5. Be prepared for relapse or difficult situations. Be determined to continue trying to quit, even if you do not succeed at first.  1. GET READY  Set a quit date.   Change your environment.   Get rid of ALL cigarettes, ashtrays, matches, and lighters in your home, car, and place of work.   Do not let people smoke in your home.   Review your past attempts to quit. Think about what worked and what did not.   Once you quit, do not smoke. NOT EVEN A PUFF!  2. GET SUPPORT AND ENCOURAGEMENT Studies have shown that you have a better chance of being successful if you have help. You can get support in many ways.  Tell   your family, friends, and coworkers that you are going to quit and need their support. Ask them not to smoke around you.   Talk to your caregivers (doctor, dentist, nurse, pharmacist, psychologist, and/or smoking counselor).   Get individual, group, or telephone counseling and support. The more counseling you have, the better your chances are of quitting. Programs are available at local hospitals and health centers. Call your local health department for information about programs in your area.   Spiritual beliefs and practices may help some smokers quit.   Quit meters are small computer programs online or downloadable that keep track of quit statistics, such as amount  of "quit-time," cigarettes not smoked, and money saved.   Many smokers find one or more of the many self-help books available useful in helping them quit and stay off tobacco.  3. LEARN NEW SKILLS AND BEHAVIORS  Try to distract yourself from urges to smoke. Talk to someone, go for a walk, or occupy your time with a task.   When you first try to quit, change your routine. Take a different route to work. Drink tea instead of coffee. Eat breakfast in a different place.   Do something to reduce your stress. Take a hot bath, exercise, or read a book.   Plan something enjoyable to do every day. Reward yourself for not smoking.   Explore interactive web-based programs that specialize in helping you quit.  4. GET MEDICINE AND USE IT CORRECTLY Medicines can help you stop smoking and decrease the urge to smoke. Combining medicine with the above behavioral methods and support can quadruple your chances of successfully quitting smoking. The [place]. Food and Drug Administration (FDA) has approved 7 medicines to help you quit smoking. These medicines fall into 3 categories.  Nicotine replacement therapy (delivers nicotine to your body without the negative effects and risks of smoking):   Nicotine gum: Available over-the-counter.   Nicotine lozenges: Available over-the-counter.   Nicotine inhaler: Available by prescription.   Nicotine nasal spray: Available by prescription.   Nicotine skin patches (transdermal): Available by prescription and over-the-counter.   Antidepressant medicine (helps people abstain from smoking, but how this works is unknown):   Bupropion sustained-release (SR) tablets: Available by prescription.   Nicotinic receptor partial agonist (simulates the effect of nicotine in your brain):   Varenicline tartrate tablets: Available by prescription.   Ask your caregiver for advice about which medicines to use and how to use them. Carefully read the information on the package.    Everyone who is trying to quit may benefit from using a medicine. If you are pregnant or trying to become pregnant, nursing an infant, you are under age 18, or you smoke fewer than 10 cigarettes per day, talk to your caregiver before taking any nicotine replacement medicines.   You should stop using a nicotine replacement product and call your caregiver if you experience nausea, dizziness, weakness, vomiting, fast or irregular heartbeat, mouth problems with the lozenge or gum, or redness or swelling of the skin around the patch that does not go away.   Do not use any other product containing nicotine while using a nicotine replacement product.   Talk to your caregiver before using these products if you have diabetes, heart disease, asthma, stomach ulcers, you had a recent heart attack, you have high blood pressure that is not controlled with medicine, a history of irregular heartbeat, or you have been prescribed medicine to help you quit smoking.  5. BE PREPARED FOR RELAPSE OR   DIFFICULT SITUATIONS  Most relapses occur within the first 3 months after quitting. Do not be discouraged if you start smoking again. Remember, most people try several times before they finally quit.   You may have symptoms of withdrawal because your body is used to nicotine. You may crave cigarettes, be irritable, feel very hungry, cough often, get headaches, or have difficulty concentrating.   The withdrawal symptoms are only temporary. They are strongest when you first quit, but they will go away within 10 to 14 days.  Here are some difficult situations to watch for:  Alcohol. Avoid drinking alcohol. Drinking lowers your chances of successfully quitting.   Caffeine. Try to reduce the amount of caffeine you consume. It also lowers your chances of successfully quitting.   Other smokers. Being around smoking can make you want to smoke. Avoid smokers.   Weight gain. Many smokers will gain weight when they quit, usually  less than 10 pounds. Eat a healthy diet and stay active. Do not let weight gain distract you from your main goal, quitting smoking. Some medicines that help you quit smoking may also help delay weight gain. You can always lose the weight gained after you quit.   Bad mood or depression. There are a lot of ways to improve your mood other than smoking.  If you are having problems with any of these situations, talk to your caregiver. SPECIAL SITUATIONS OR CONDITIONS Studies suggest that everyone can quit smoking. Your situation or condition can give you a special reason to quit.  Pregnant women/New mothers: By quitting, you protect your baby's health and your own.   Hospitalized patients: By quitting, you reduce health problems and help healing.   Heart attack patients: By quitting, you reduce your risk of a second heart attack.   Lung, head, and neck cancer patients: By quitting, you reduce your chance of a second cancer.   Parents of children and adolescents: By quitting, you protect your children from illnesses caused by secondhand smoke.  QUESTIONS TO THINK ABOUT Think about the following questions before you try to stop smoking. You may want to talk about your answers with your caregiver.  Why do you want to quit?   If you tried to quit in the past, what helped and what did not?   What will be the most difficult situations for you after you quit? How will you plan to handle them?   Who can help you through the tough times? Your family? Friends? Caregiver?   What pleasures do you get from smoking? What ways can you still get pleasure if you quit?  Here are some questions to ask your caregiver:  How can you help me to be successful at quitting?   What medicine do you think would be best for me and how should I take it?   What should I do if I need more help?   What is smoking withdrawal like? How can I get information on withdrawal?  Quitting takes hard work and a lot of effort,  but you can quit smoking. FOR MORE INFORMATION Smokefree.gov (http://www.smokefree.gov) provides free, accurate, evidence-based information and professional assistance to help support the immediate and long-term needs of people trying to quit smoking. Document Released: 06/06/2001 Document Re-Released: 11/30/2009 [employer] Patient Information 2011 [employer]. 

## 2010-11-17 NOTE — Assessment & Plan Note (Signed)
Check labs con't meds 

## 2010-11-17 NOTE — Assessment & Plan Note (Signed)
Check labs  No change 

## 2010-12-29 ENCOUNTER — Other Ambulatory Visit: Payer: Self-pay | Admitting: Family Medicine

## 2010-12-29 DIAGNOSIS — E119 Type 2 diabetes mellitus without complications: Secondary | ICD-10-CM

## 2010-12-29 MED ORDER — METFORMIN HCL 850 MG PO TABS: ORAL_TABLET | ORAL | Status: DC

## 2010-12-29 NOTE — Telephone Encounter (Signed)
Done

## 2011-01-31 ENCOUNTER — Other Ambulatory Visit: Payer: Self-pay | Admitting: Family Medicine

## 2011-01-31 DIAGNOSIS — G4700 Insomnia, unspecified: Secondary | ICD-10-CM

## 2011-01-31 DIAGNOSIS — F419 Anxiety disorder, unspecified: Secondary | ICD-10-CM

## 2011-01-31 NOTE — Telephone Encounter (Signed)
Last seen 11/17/10 and filled 10/24/10 please advise    KP

## 2011-02-01 MED ORDER — DIAZEPAM 5 MG PO TABS: 5.0000 mg | ORAL_TABLET | Freq: Every evening | ORAL | Status: DC | PRN

## 2011-02-01 NOTE — Telephone Encounter (Signed)
Rx faxed

## 2011-02-03 ENCOUNTER — Other Ambulatory Visit: Payer: Self-pay | Admitting: Family Medicine

## 2011-02-03 DIAGNOSIS — I10 Essential (primary) hypertension: Secondary | ICD-10-CM

## 2011-02-03 DIAGNOSIS — E119 Type 2 diabetes mellitus without complications: Secondary | ICD-10-CM

## 2011-02-03 MED ORDER — SITAGLIPTIN PHOSPHATE 100 MG PO TABS: 100.0000 mg | ORAL_TABLET | Freq: Every day | ORAL | Status: DC

## 2011-02-03 MED ORDER — HYDROCHLOROTHIAZIDE 25 MG PO TABS: 25.0000 mg | ORAL_TABLET | Freq: Every day | ORAL | Status: DC

## 2011-02-03 NOTE — Telephone Encounter (Signed)
rx sent to pharmacy

## 2011-03-01 ENCOUNTER — Other Ambulatory Visit: Payer: Self-pay | Admitting: Family Medicine

## 2011-03-01 DIAGNOSIS — Z1231 Encounter for screening mammogram for malignant neoplasm of breast: Secondary | ICD-10-CM

## 2011-03-06 ENCOUNTER — Ambulatory Visit
Admission: RE | Admit: 2011-03-06 | Discharge: 2011-03-06 | Disposition: A | Discharge status: Home / Self Care | Payer: BC Managed Care – PPO | Source: Ambulatory Visit | Attending: Family Medicine | Admitting: Family Medicine

## 2011-03-06 DIAGNOSIS — Z1231 Encounter for screening mammogram for malignant neoplasm of breast: Secondary | ICD-10-CM

## 2011-03-08 ENCOUNTER — Ambulatory Visit (INDEPENDENT_AMBULATORY_CARE_PROVIDER_SITE_OTHER): Payer: BC Managed Care – PPO | Admitting: Family Medicine

## 2011-03-08 ENCOUNTER — Encounter: Payer: Self-pay | Admitting: Family Medicine

## 2011-03-08 DIAGNOSIS — R51 Headache: Secondary | ICD-10-CM

## 2011-03-08 DIAGNOSIS — L659 Nonscarring hair loss, unspecified: Secondary | ICD-10-CM

## 2011-03-08 DIAGNOSIS — R519 Headache, unspecified: Secondary | ICD-10-CM

## 2011-03-08 LAB — BASIC METABOLIC PANEL
BUN: 9 mg/dL (ref 6–23)
CO2: 30 mEq/L (ref 19–32)
Calcium: 9.8 mg/dL (ref 8.4–10.5)
Chloride: 100 mEq/L (ref 96–112)
Creatinine, Ser: 0.6 mg/dL (ref 0.4–1.2)
GFR: 108.03 mL/min (ref >60.00)
Glucose, Bld: 112 mg/dL — ABNORMAL HIGH (ref 70–99)
Potassium: 4.7 mEq/L (ref 3.5–5.1)
Sodium: 138 mEq/L (ref 135–145)

## 2011-03-08 LAB — HEPATIC FUNCTION PANEL
ALT: 49 U/L — ABNORMAL HIGH (ref 0–35)
AST: 35 U/L (ref 0–37)
Albumin: 4.2 g/dL (ref 3.5–5.2)
Alkaline Phosphatase: 112 U/L (ref 39–117)
Bilirubin, Direct: 0 mg/dL (ref 0.0–0.3)
Total Bilirubin: 0.4 mg/dL (ref 0.3–1.2)
Total Protein: 7.5 g/dL (ref 6.0–8.3)

## 2011-03-08 LAB — CBC WITH DIFFERENTIAL/PLATELET
Basophils Absolute: 0.1 10*3/uL (ref 0.0–0.1)
Basophils Relative: 0.6 % (ref 0.0–3.0)
Eosinophils Absolute: 0.2 10*3/uL (ref 0.0–0.7)
Eosinophils Relative: 1.7 % (ref 0.0–5.0)
HCT: 42.9 % (ref 36.0–46.0)
Hemoglobin: 14.1 g/dL (ref 12.0–15.0)
Lymphocytes Relative: 34.1 % (ref 12.0–46.0)
Lymphs Abs: 3.2 10*3/uL (ref 0.7–4.0)
MCHC: 32.9 g/dL (ref 30.0–36.0)
MCV: 89.7 fl (ref 78.0–100.0)
Monocytes Absolute: 0.6 10*3/uL (ref 0.1–1.0)
Monocytes Relative: 6.3 % (ref 3.0–12.0)
Neutro Abs: 5.4 10*3/uL (ref 1.4–7.7)
Neutrophils Relative %: 57.3 % (ref 43.0–77.0)
Platelets: 273 10*3/uL (ref 150.0–400.0)
RBC: 4.78 Mil/uL (ref 3.87–5.11)
RDW: 14.9 % — ABNORMAL HIGH (ref 11.5–14.6)
WBC: 9.3 10*3/uL (ref 4.5–10.5)

## 2011-03-08 LAB — TSH: TSH: 1.89 u[IU]/mL (ref 0.35–5.50)

## 2011-03-08 LAB — SEDIMENTATION RATE: Sed Rate: 22 mm/hr (ref 0–22)

## 2011-03-08 MED ORDER — GLUCOSE BLOOD VI STRP: ORAL_STRIP | Status: DC

## 2011-03-08 MED ORDER — CHOLECALCIFEROL 25 MCG (1000 UT) PO CAPS: 1000.0000 [IU] | ORAL_CAPSULE | Freq: Every day | ORAL | Status: DC

## 2011-03-08 NOTE — Progress Notes (Signed)
  Subjective:    Alyssa Ford is a 60 y.o. female who presents for evaluation of headache. Symptoms began about 1 year ago. Generally, the headaches last about 30 min and occur every morning. The headaches are usually worse in the morning. The headaches are usually moderate and throbbing and are located in left side of head and over eye.  The patient rates her most severe headaches a 10 on a scale from 1 to 10. Recently, the headaches have been increasing in both severity and frequency. Work attendance or other daily activities are not affected by the headaches. Precipitating factors include: head trauma. The headaches are usually not preceded by an aura. Associated neurologic symptoms: decreased physical activity. The patient denies depression, dizziness, loss of balance, muscle weakness, numbness of extremities, speech difficulties, vision problems and vomiting in the early morning. Home treatment has included naproxen, darkening the room, resting and sleeping with some improvement. Other history includes: fractured nose --1 month ago. Family history includes no known family members with significant headaches.  The following portions of the patient's history were reviewed and updated as appropriate: allergies, current medications, past family history, past medical history, past social history, past surgical history and problem list.  Review of Systems Pertinent items are noted in HPI.    Objective:    BP 122/84  Pulse 83  Temp(Src) 98.3 F (36.8 C) (Oral)  Wt 158 lb (71.668 kg)  SpO2 98% General appearance: alert, cooperative, appears stated age and no distress Head: Normocephalic, without obvious abnormality, atraumatic Eyes: conjunctivae/corneas clear. PERRL, EOM's intact. Fundi benign. Ears: normal TM's and external ear canals both ears Nose: Nares normal. Septum midline. Mucosa normal. No drainage or sinus tenderness. Throat: lips, mucosa, and tongue normal; teeth and gums  normal Neck: no adenopathy, no carotid bruit, no JVD, supple, symmetrical, trachea midline and thyroid not enlarged, symmetric, no tenderness/mass/nodules Lungs: clear to auscultation bilaterally Heart: S1, S2 normal Lymph nodes: Cervical, supraclavicular, and axillary nodes normal. Neurologic: Alert and oriented X 3, normal strength and tone. Normal symmetric reflexes. Normal coordination and gait    Assessment:    headaches    Plan:    Lie in darkened room and apply cold packs as needed for pain. Episodic therapy: NSAIDs due to low frequency of pain. Side effect profile discussed in detail. Asked to keep headache diary. Patient reassured that neurodiagnostic workup not indicated from benign H&P. Follow up in 2 weeks. check labs including ESR---r/o Temp art

## 2011-03-08 NOTE — Patient Instructions (Signed)
Headache and Allergies The relationship between allergies and headaches is unclear. Many people with allergic or infectious nasal problems also have headaches (migraines or sinus headaches). However, sometimes allergies can cause pressure that feels like a headache, and sometimes headaches can cause allergy-like symptoms. It is not always clear whether your symptoms are caused by allergies or by a headache. CAUSES  Migraine: The cause of a migraine is not always known.   Sinus Headache: The cause of a sinus headache may be a sinus infection. Other conditions that may be related to sinus headaches include:   Hay fever (allergic rhinitis).   Deviation of the nasal septum.   Swelling or clogging of the nasal passages.  SYMPTOMS Migraine headache symptoms (which often last 4 to 72 hours) include:  Intense, throbbing pain on one or both sides of the head.   Nausea.   Vomiting.   Being extra sensitive to light.   Being extra sensitive to sound.   Nervous system reactions that appear similar to an allergic reaction:   Stuffy nose.   Runny nose.   Tearing.  Sinus headaches are felt as facial pain or pressure.  DIAGNOSIS Because there is some overlap in symptoms, sinus and migraine headaches are often misdiagnosed. For example, a person with migraines may also feel facial pressure. Likewise, many people with hay fever may get migraine headaches rather than sinus headaches. These migraines can be triggered by the histamine release during an allergic reaction. An antihistamine medicine can eliminate this pain. There are standard criteria that help clarify the difference between these headaches and related allergy or allergy-like symptoms. Your caregiver can use these criteria to determine the proper diagnosis and provide you the best care. TREATMENT Migraine medicine may help people who have persistent migraine headaches even though their hay fever is controlled. For some people,  anti-inflammatory treatments do not work to relieve migraines. Medicines called triptans (such as sumatriptan) can be helpful for those people. Document Released: 09/02/2003 Document Re-Released: 11/30/2009 [facility] Patient Information 2011 [place], [place].

## 2011-03-16 ENCOUNTER — Other Ambulatory Visit: Payer: Self-pay | Admitting: Family Medicine

## 2011-03-20 LAB — I-STAT 8, (EC8 V) (CONVERTED LAB)
Acid-Base Excess: 3 — ABNORMAL HIGH
BUN: 11
Bicarbonate: 27.2 — ABNORMAL HIGH
Chloride: 104
Glucose, Bld: 117 — ABNORMAL HIGH
HCT: 46
Hemoglobin: 15.6 — ABNORMAL HIGH
Operator id: 270651
Potassium: 3.8
Sodium: 136
TCO2: 28
pCO2, Ven: 39 — ABNORMAL LOW
pH, Ven: 7.451 — ABNORMAL HIGH

## 2011-03-20 LAB — DIFFERENTIAL
Basophils Absolute: 0
Basophils Relative: 1
Eosinophils Absolute: 0.1
Eosinophils Relative: 2
Lymphocytes Relative: 36
Lymphs Abs: 2.9
Monocytes Absolute: 0.5
Monocytes Relative: 6
Neutro Abs: 4.4
Neutrophils Relative %: 56

## 2011-03-20 LAB — POCT CARDIAC MARKERS
CKMB, poc: <1 — ABNORMAL LOW
CKMB, poc: <1 — ABNORMAL LOW
Myoglobin, poc: 42.7
Myoglobin, poc: 48.3
Operator id: 270651
Operator id: 285841
Troponin i, poc: <0.05
Troponin i, poc: <0.05

## 2011-03-20 LAB — CBC
HCT: 42.9
Hemoglobin: 14.6
MCHC: 34.2
MCV: 89.5
Platelets: 265
RBC: 4.79
RDW: 13.8
WBC: 8

## 2011-03-20 LAB — POCT I-STAT CREATININE
Creatinine, Ser: 0.9
Operator id: 270651

## 2011-03-20 LAB — D-DIMER, QUANTITATIVE (NOT AT ARMC): D-Dimer, Quant: <0.22

## 2011-03-23 LAB — BASIC METABOLIC PANEL
BUN: 9
CO2: 28
Calcium: 9.3
Chloride: 98
Creatinine, Ser: 0.83
GFR calc Af Amer: >60
GFR calc non Af Amer: >60
Glucose, Bld: 116 — ABNORMAL HIGH
Potassium: 3.4 — ABNORMAL LOW
Sodium: 137

## 2011-03-23 LAB — POCT HEMOGLOBIN-HEMACUE: Hemoglobin: 15.2 — ABNORMAL HIGH

## 2011-03-25 ENCOUNTER — Other Ambulatory Visit: Payer: Self-pay | Admitting: Family Medicine

## 2011-03-28 NOTE — Telephone Encounter (Signed)
Last seen 03/08/11 and filled 11/17/10 # 60 with 2 refills. Please advise     KP

## 2011-04-13 ENCOUNTER — Ambulatory Visit (INDEPENDENT_AMBULATORY_CARE_PROVIDER_SITE_OTHER): Payer: BC Managed Care – PPO

## 2011-04-13 DIAGNOSIS — Z23 Encounter for immunization: Secondary | ICD-10-CM

## 2011-04-25 ENCOUNTER — Other Ambulatory Visit: Payer: Self-pay | Admitting: Family Medicine

## 2011-04-25 NOTE — Telephone Encounter (Signed)
Faxed.   KP 

## 2011-04-25 NOTE — Telephone Encounter (Signed)
Last seen 03/08/11 and seen 02/01/11 # 30 with 1 refill.     KP

## 2011-07-25 ENCOUNTER — Other Ambulatory Visit: Payer: Self-pay | Admitting: Family Medicine

## 2011-07-25 NOTE — Telephone Encounter (Signed)
Last seen 03/08/11 and filled 04/25/11 # 30. Please advise    KP

## 2011-08-08 ENCOUNTER — Encounter: Payer: Self-pay | Admitting: Family Medicine

## 2011-08-08 LAB — HM DIABETES EYE EXAM

## 2011-08-21 ENCOUNTER — Encounter: Payer: Self-pay | Admitting: Family Medicine

## 2011-08-21 ENCOUNTER — Ambulatory Visit (INDEPENDENT_AMBULATORY_CARE_PROVIDER_SITE_OTHER): Payer: BC Managed Care – PPO | Admitting: Family Medicine

## 2011-08-21 VITALS — BP 140/82 | HR 91 | Temp 97.9°F | Wt 161.0 lb

## 2011-08-21 DIAGNOSIS — R51 Headache: Secondary | ICD-10-CM

## 2011-08-21 DIAGNOSIS — G8929 Other chronic pain: Secondary | ICD-10-CM

## 2011-08-21 DIAGNOSIS — I10 Essential (primary) hypertension: Secondary | ICD-10-CM

## 2011-08-21 DIAGNOSIS — M6530 Trigger finger, unspecified finger: Secondary | ICD-10-CM

## 2011-08-21 DIAGNOSIS — E785 Hyperlipidemia, unspecified: Secondary | ICD-10-CM

## 2011-08-21 DIAGNOSIS — L659 Nonscarring hair loss, unspecified: Secondary | ICD-10-CM

## 2011-08-21 DIAGNOSIS — R519 Headache, unspecified: Secondary | ICD-10-CM | POA: Insufficient documentation

## 2011-08-21 DIAGNOSIS — E119 Type 2 diabetes mellitus without complications: Secondary | ICD-10-CM

## 2011-08-21 NOTE — Assessment & Plan Note (Signed)
con't meds  Check labs 

## 2011-08-21 NOTE — Progress Notes (Signed)
  Subjective:    Patient ID: Alyssa Ford, female    DOB: 1950/12/29, 61 y.o.   MRN: [id number]  HPI Pt here for f/u and complains of headaches every am for last 8 months.  Headaches go away with naproxen but come back the next day.  See last visit. HYPERTENSION Disease Monitoring Blood pressure range-not checking at home Chest pain- no      Dyspnea- no Medications Compliance- good Lightheadedness- no   Edema- no   DIABETES Disease Monitoring Blood Sugar ranges-not checking Polyuria- no New Visual problems- no Medications Compliance- good Hypoglycemic symptoms- no   HYPERLIPIDEMIA Disease Monitoring See symptoms for Hypertension Medications Compliance- good RUQ pain- no  Muscle aches- no  ROS See HPI above   PMH Smoking Status noted      Review of Systems As above    Objective:   Physical Exam  Constitutional: She is oriented to person, place, and time. She appears well-developed and well-nourished.  Neck: Normal range of motion. Neck supple.  Cardiovascular: Normal rate, regular rhythm and normal heart sounds.   No murmur heard. Pulmonary/Chest: Effort normal and breath sounds normal. No respiratory distress. She has no wheezes. She has no rales. She exhibits no tenderness.  Abdominal: Soft. Bowel sounds are normal. There is no tenderness.  Musculoskeletal: She exhibits no edema and no tenderness.  Neurological: She is alert and oriented to person, place, and time.  Skin: Skin is warm and dry.  Psychiatric: She has a normal mood and affect. Her behavior is normal. Judgment and thought content normal.          Assessment & Plan:

## 2011-08-21 NOTE — Assessment & Plan Note (Signed)
Check MRI---secondary to having the headaches for so long

## 2011-08-21 NOTE — Patient Instructions (Signed)

## 2011-08-21 NOTE — Assessment & Plan Note (Signed)
Stable con't meds 

## 2011-08-21 NOTE — Assessment & Plan Note (Signed)
Cont meds Check labs 

## 2011-08-22 NOTE — Progress Notes (Signed)
  Subjective:    Patient ID: Alyssa Ford, female    DOB: 06/10/1951, 61 y.o.   MRN: [id number]  HPI    Review of Systems     Objective:   Physical Exam        Assessment & Plan:

## 2011-08-22 NOTE — Progress Notes (Signed)
  Subjective:    Patient ID: Alyssa Ford, female    DOB: 10-Mar-1951, 61 y.o.   MRN: [id number]  HPI    Review of Systems     Objective:   Physical Exam r middle finger gets stuck on occasion---no swelling or errythema      Assessment & Plan:  Trigger finger--- refer to ortho

## 2011-08-24 ENCOUNTER — Ambulatory Visit (HOSPITAL_COMMUNITY)
Admission: RE | Admit: 2011-08-24 | Discharge: 2011-08-24 | Disposition: A | Discharge status: Home / Self Care | Payer: BC Managed Care – PPO | Source: Ambulatory Visit | Attending: Family Medicine | Admitting: Family Medicine

## 2011-08-24 DIAGNOSIS — G8929 Other chronic pain: Secondary | ICD-10-CM

## 2011-08-24 DIAGNOSIS — R51 Headache: Secondary | ICD-10-CM | POA: Insufficient documentation

## 2011-08-24 DIAGNOSIS — I6789 Other cerebrovascular disease: Secondary | ICD-10-CM | POA: Insufficient documentation

## 2011-08-25 ENCOUNTER — Other Ambulatory Visit (INDEPENDENT_AMBULATORY_CARE_PROVIDER_SITE_OTHER): Payer: BC Managed Care – PPO

## 2011-08-25 ENCOUNTER — Encounter: Payer: Self-pay | Admitting: Family Medicine

## 2011-08-25 DIAGNOSIS — E1151 Type 2 diabetes mellitus with diabetic peripheral angiopathy without gangrene: Secondary | ICD-10-CM | POA: Insufficient documentation

## 2011-08-25 DIAGNOSIS — L659 Nonscarring hair loss, unspecified: Secondary | ICD-10-CM

## 2011-08-25 DIAGNOSIS — E1165 Type 2 diabetes mellitus with hyperglycemia: Secondary | ICD-10-CM | POA: Insufficient documentation

## 2011-08-25 DIAGNOSIS — E119 Type 2 diabetes mellitus without complications: Secondary | ICD-10-CM

## 2011-08-25 LAB — CBC WITH DIFFERENTIAL/PLATELET
Basophils Absolute: 0.1 10*3/uL (ref 0.0–0.1)
Basophils Relative: 0.8 % (ref 0.0–3.0)
Eosinophils Absolute: 0.1 10*3/uL (ref 0.0–0.7)
Eosinophils Relative: 1.4 % (ref 0.0–5.0)
HCT: 44.9 % (ref 36.0–46.0)
Hemoglobin: 14.9 g/dL (ref 12.0–15.0)
Lymphocytes Relative: 34.6 % (ref 12.0–46.0)
Lymphs Abs: 3.1 10*3/uL (ref 0.7–4.0)
MCHC: 33.2 g/dL (ref 30.0–36.0)
MCV: 89.2 fl (ref 78.0–100.0)
Monocytes Absolute: 0.5 10*3/uL (ref 0.1–1.0)
Monocytes Relative: 5.6 % (ref 3.0–12.0)
Neutro Abs: 5.2 10*3/uL (ref 1.4–7.7)
Neutrophils Relative %: 57.6 % (ref 43.0–77.0)
Platelets: 281 10*3/uL (ref 150.0–400.0)
RBC: 5.04 Mil/uL (ref 3.87–5.11)
RDW: 14.5 % (ref 11.5–14.6)
WBC: 9 10*3/uL (ref 4.5–10.5)

## 2011-08-25 LAB — HEPATIC FUNCTION PANEL
ALT: 38 U/L — ABNORMAL HIGH (ref 0–35)
AST: 24 U/L (ref 0–37)
Albumin: 4.2 g/dL (ref 3.5–5.2)
Alkaline Phosphatase: 97 U/L (ref 39–117)
Bilirubin, Direct: 0 mg/dL (ref 0.0–0.3)
Total Bilirubin: 0.3 mg/dL (ref 0.3–1.2)
Total Protein: 7.5 g/dL (ref 6.0–8.3)

## 2011-08-25 LAB — T4, FREE: Free T4: 0.98 ng/dL (ref 0.60–1.60)

## 2011-08-25 LAB — MICROALBUMIN / CREATININE URINE RATIO
Creatinine,U: 40.3 mg/dL
Microalb Creat Ratio: 3.5 mg/g (ref 0.0–30.0)
Microalb, Ur: 1.4 mg/dL (ref 0.0–1.9)

## 2011-08-25 LAB — BASIC METABOLIC PANEL
BUN: 11 mg/dL (ref 6–23)
CO2: 31 mEq/L (ref 19–32)
Calcium: 9.6 mg/dL (ref 8.4–10.5)
Chloride: 97 mEq/L (ref 96–112)
Creatinine, Ser: 0.8 mg/dL (ref 0.4–1.2)
GFR: 78.53 mL/min (ref >60.00)
Glucose, Bld: 127 mg/dL — ABNORMAL HIGH (ref 70–99)
Potassium: 4.1 mEq/L (ref 3.5–5.1)
Sodium: 135 mEq/L (ref 135–145)

## 2011-08-25 LAB — TSH: TSH: 1.72 u[IU]/mL (ref 0.35–5.50)

## 2011-08-25 LAB — LIPID PANEL
Cholesterol: 271 mg/dL — ABNORMAL HIGH (ref 0–200)
HDL: 42.8 mg/dL (ref >39.00)
Total CHOL/HDL Ratio: 6
Triglycerides: 219 mg/dL — ABNORMAL HIGH (ref 0.0–149.0)
VLDL: 43.8 mg/dL — ABNORMAL HIGH (ref 0.0–40.0)

## 2011-08-25 LAB — T3, FREE: T3, Free: 2.7 pg/mL (ref 2.3–4.2)

## 2011-08-25 LAB — HEMOGLOBIN A1C: Hgb A1c MFr Bld: 6.6 % — ABNORMAL HIGH (ref 4.6–6.5)

## 2011-08-25 LAB — LDL CHOLESTEROL, DIRECT: Direct LDL: 197 mg/dL

## 2011-09-08 ENCOUNTER — Other Ambulatory Visit: Payer: BC Managed Care – PPO

## 2011-09-21 ENCOUNTER — Other Ambulatory Visit: Payer: Self-pay | Admitting: Family Medicine

## 2011-09-21 NOTE — Telephone Encounter (Signed)
Last seen 08/21/11 and filled 01/31/11 # 30 with 1 refill. Please [name]

## 2011-09-25 ENCOUNTER — Encounter: Payer: BC Managed Care – PPO | Admitting: Family Medicine

## 2011-09-28 ENCOUNTER — Other Ambulatory Visit: Payer: Self-pay | Admitting: Family Medicine

## 2011-11-26 ENCOUNTER — Other Ambulatory Visit: Payer: Self-pay | Admitting: Family Medicine

## 2011-11-27 NOTE — Telephone Encounter (Signed)
Last seen 08/21/11 and filled 09/21/11 # 30. Please advise    KP

## 2011-11-29 ENCOUNTER — Telehealth: Payer: Self-pay

## 2011-11-29 NOTE — Telephone Encounter (Signed)
Letter from [facility] advising patient is due for a colonoscopy and pap, she has a CPE scheduled on 12/07/11 and no documented Colonoscopy in the system. msg left to clarify of patient has had one recently,    KP

## 2011-12-01 NOTE — Telephone Encounter (Signed)
Letter mailed     KP 

## 2011-12-04 NOTE — Telephone Encounter (Signed)
Discussed with patient she said her last colonoscopy was done in 2008 or 2009 with Dr.Patterson. I checked the paper chart and it was done 08/22/06. I sent a copy to be scanned and updated HM.      KP

## 2011-12-07 ENCOUNTER — Other Ambulatory Visit: Payer: BC Managed Care – PPO

## 2011-12-07 ENCOUNTER — Encounter: Payer: BC Managed Care – PPO | Admitting: Family Medicine

## 2011-12-11 ENCOUNTER — Encounter: Payer: Self-pay | Admitting: Gastroenterology

## 2011-12-25 ENCOUNTER — Ambulatory Visit (INDEPENDENT_AMBULATORY_CARE_PROVIDER_SITE_OTHER): Payer: BC Managed Care – PPO | Admitting: Family Medicine

## 2011-12-25 ENCOUNTER — Encounter: Payer: Self-pay | Admitting: Family Medicine

## 2011-12-25 ENCOUNTER — Encounter: Payer: Self-pay | Admitting: Gastroenterology

## 2011-12-25 ENCOUNTER — Other Ambulatory Visit (HOSPITAL_COMMUNITY)
Admission: RE | Admit: 2011-12-25 | Discharge: 2011-12-25 | Disposition: A | Discharge status: Home / Self Care | Payer: BC Managed Care – PPO | Source: Ambulatory Visit | Attending: Family Medicine | Admitting: Family Medicine

## 2011-12-25 VITALS — BP 136/82 | HR 90 | Temp 98.3°F | Ht 66.5 in | Wt 160.6 lb

## 2011-12-25 DIAGNOSIS — K219 Gastro-esophageal reflux disease without esophagitis: Secondary | ICD-10-CM

## 2011-12-25 DIAGNOSIS — Z23 Encounter for immunization: Secondary | ICD-10-CM

## 2011-12-25 DIAGNOSIS — Z01419 Encounter for gynecological examination (general) (routine) without abnormal findings: Secondary | ICD-10-CM | POA: Insufficient documentation

## 2011-12-25 DIAGNOSIS — Z0000 Encounter for general adult medical examination without abnormal findings: Secondary | ICD-10-CM

## 2011-12-25 DIAGNOSIS — Z124 Encounter for screening for malignant neoplasm of cervix: Secondary | ICD-10-CM

## 2011-12-25 DIAGNOSIS — I10 Essential (primary) hypertension: Secondary | ICD-10-CM

## 2011-12-25 DIAGNOSIS — E785 Hyperlipidemia, unspecified: Secondary | ICD-10-CM

## 2011-12-25 DIAGNOSIS — Z2911 Encounter for prophylactic immunotherapy for respiratory syncytial virus (RSV): Secondary | ICD-10-CM

## 2011-12-25 DIAGNOSIS — E119 Type 2 diabetes mellitus without complications: Secondary | ICD-10-CM

## 2011-12-25 LAB — CBC WITH DIFFERENTIAL/PLATELET
Basophils Absolute: 0.1 10*3/uL (ref 0.0–0.1)
Basophils Relative: 0.6 % (ref 0.0–3.0)
Eosinophils Absolute: 0.1 10*3/uL (ref 0.0–0.7)
Eosinophils Relative: 0.9 % (ref 0.0–5.0)
HCT: 42.8 % (ref 36.0–46.0)
Hemoglobin: 14.2 g/dL (ref 12.0–15.0)
Lymphocytes Relative: 30.2 % (ref 12.0–46.0)
Lymphs Abs: 2.7 10*3/uL (ref 0.7–4.0)
MCHC: 33.2 g/dL (ref 30.0–36.0)
MCV: 90 fl (ref 78.0–100.0)
Monocytes Absolute: 0.7 10*3/uL (ref 0.1–1.0)
Monocytes Relative: 7.4 % (ref 3.0–12.0)
Neutro Abs: 5.5 10*3/uL (ref 1.4–7.7)
Neutrophils Relative %: 60.9 % (ref 43.0–77.0)
Platelets: 267 10*3/uL (ref 150.0–400.0)
RBC: 4.76 Mil/uL (ref 3.87–5.11)
RDW: 14.8 % — ABNORMAL HIGH (ref 11.5–14.6)
WBC: 9.1 10*3/uL (ref 4.5–10.5)

## 2011-12-25 LAB — POCT URINALYSIS DIPSTICK
Bilirubin, UA: NEGATIVE
Blood, UA: NEGATIVE
Glucose, UA: NEGATIVE
Ketones, UA: NEGATIVE
Leukocytes, UA: NEGATIVE
Nitrite, UA: NEGATIVE
Protein, UA: NEGATIVE
Spec Grav, UA: <=1.005
Urobilinogen, UA: 0.2
pH, UA: 8

## 2011-12-25 LAB — HEPATIC FUNCTION PANEL
ALT: 38 U/L — ABNORMAL HIGH (ref 0–35)
AST: 27 U/L (ref 0–37)
Albumin: 4.3 g/dL (ref 3.5–5.2)
Alkaline Phosphatase: 107 U/L (ref 39–117)
Bilirubin, Direct: 0 mg/dL (ref 0.0–0.3)
Total Bilirubin: 0.4 mg/dL (ref 0.3–1.2)
Total Protein: 8 g/dL (ref 6.0–8.3)

## 2011-12-25 LAB — LIPID PANEL
Cholesterol: 171 mg/dL (ref 0–200)
HDL: 49 mg/dL (ref >39.00)
LDL Cholesterol: 93 mg/dL (ref 0–99)
Total CHOL/HDL Ratio: 3
Triglycerides: 146 mg/dL (ref 0.0–149.0)
VLDL: 29.2 mg/dL (ref 0.0–40.0)

## 2011-12-25 LAB — TSH: TSH: 2.23 u[IU]/mL (ref 0.35–5.50)

## 2011-12-25 LAB — BASIC METABOLIC PANEL
BUN: 12 mg/dL (ref 6–23)
CO2: 29 mEq/L (ref 19–32)
Calcium: 9.8 mg/dL (ref 8.4–10.5)
Chloride: 96 mEq/L (ref 96–112)
Creatinine, Ser: 0.6 mg/dL (ref 0.4–1.2)
GFR: 100.01 mL/min (ref >60.00)
Glucose, Bld: 125 mg/dL — ABNORMAL HIGH (ref 70–99)
Potassium: 3.8 mEq/L (ref 3.5–5.1)
Sodium: 135 mEq/L (ref 135–145)

## 2011-12-25 LAB — HEMOGLOBIN A1C: Hgb A1c MFr Bld: 6.9 % — ABNORMAL HIGH (ref 4.6–6.5)

## 2011-12-25 LAB — H. PYLORI ANTIBODY, IGG: H Pylori IgG: NEGATIVE

## 2011-12-25 MED ORDER — GLUCOSE BLOOD VI STRP: ORAL_STRIP | Status: DC

## 2011-12-25 NOTE — Progress Notes (Signed)
Subjective:     Alyssa Ford is a 61 y.o. female and is here for a comprehensive physical exam. The patient reports inc gerd symptoms.  [place].  Symptoms improved with zantac and omeprazole.     History   Social History  . Marital Status: Married    Spouse Name: N/A    Number of Children: N/A  . Years of Education: N/A   Occupational History  . Not on file.   Social History Main Topics  . Smoking status: Current Everyday Smoker -- 0.8 packs/day    Types: Cigarettes  . Smokeless tobacco: Not on file   Comment: e cig  . Alcohol Use: Yes  . Drug Use: No  . Sexually Active: Yes -- Female partner(s)   Other Topics Concern  . Not on file   Social History Narrative   Exercise--  walk   Health Maintenance  Topic Date Due  . Hemoglobin A1c  02/25/2012  . Influenza Vaccine  03/26/2012  . Ophthalmology Exam  08/07/2012  . Urine Microalbumin  08/24/2012  . Foot Exam  12/24/2012  . Mammogram  03/05/2013  . Pap Smear  12/25/2014  . Colonoscopy  02/24/2020  . Tetanus/tdap  02/24/2020  . Zostavax  Completed    The following portions of the patient's history were reviewed and updated as appropriate: allergies, current medications, past family history, past medical history, past social history, past surgical history and problem list.  Review of Systems Review of Systems  Constitutional: Negative for activity change, appetite change and fatigue.  HENT: Negative for hearing loss, congestion, tinnitus and ear discharge.  dentist q8m Eyes: Negative for visual disturbance (see optho q1y -- vision corrected to 20/20 with glasses).  Respiratory: Negative for cough, chest tightness and shortness of breath.   Cardiovascular: Negative for chest pain, palpitations and leg swelling.  Gastrointestinal: Negative for abdominal pain, diarrhea, constipation and abdominal distention.  Genitourinary: Negative for urgency, frequency, decreased urine volume and  difficulty urinating.  Musculoskeletal: Negative for back pain, arthralgias and gait problem.  Skin: Negative for color change, pallor and rash.  Neurological: Negative for dizziness, light-headedness, numbness and headaches.  Hematological: Negative for adenopathy. Does not bruise/bleed easily.  Psychiatric/Behavioral: Negative for suicidal ideas, confusion, sleep disturbance, self-injury, dysphoric mood, decreased concentration and agitation.       Objective:    BP 136/82  Pulse 90  Temp 98.3 F (36.8 C) (Oral)  Ht 5' 6.5" (1.689 m)  Wt 160 lb 9.6 oz (72.848 kg)  BMI 25.53 kg/m2  SpO2 97% General appearance: alert, cooperative, appears stated age and no distress Head: Normocephalic, without obvious abnormality, atraumatic Eyes: conjunctivae/corneas clear. PERRL, EOM's intact. Fundi benign. Ears: normal TM's and external ear canals both ears Nose: Nares normal. Septum midline. Mucosa normal. No drainage or sinus tenderness. Throat: lips, mucosa, and tongue normal; teeth and gums normal Neck: no adenopathy, no carotid bruit, no JVD, supple, symmetrical, trachea midline and thyroid not enlarged, symmetric, no tenderness/mass/nodules Back: symmetric, no curvature. ROM normal. No CVA tenderness. Lungs: clear to auscultation bilaterally Breasts: normal appearance, no masses or tenderness Heart: regular rate and rhythm, S1, S2 normal, no murmur, click, rub or gallop Abdomen: soft, non-tender; bowel sounds normal; no masses,  no organomegaly Pelvic: cervix normal in appearance, external genitalia normal, no adnexal masses or tenderness, no cervical motion tenderness, rectovaginal septum normal, uterus normal size, shape, and consistency and vagina normal without discharge Extremities: extremities normal, atraumatic,  no cyanosis or edema Pulses: 2+ and symmetric Skin: Skin color, texture, turgor normal. No rashes or lesions Lymph nodes: Cervical, supraclavicular, and axillary nodes  normal. Neurologic: Alert and oriented X 3, normal strength and tone. Normal symmetric reflexes. Normal coordination and gait psych-- no depression, anxiety   Sensory exam of the foot is normal, tested with the monofilament. Good pulses, no lesions or ulcers, good peripheral pulses. Assessment:    Healthy female exam.      Plan:  ghm utd  Check fasting labs See After Visit Summary for Counseling Recommendations

## 2011-12-25 NOTE — Patient Instructions (Addendum)
Preventive Care for Adults, Female A healthy lifestyle and preventive care can promote health and wellness. Preventive health guidelines for women include the following key practices.  A routine yearly physical is a good way to check with your caregiver about your health and preventive screening. It is a chance to share any concerns and updates on your health, and to receive a thorough exam.   Visit your dentist for a routine exam and preventive care every 6 months. Brush your teeth twice a day and floss once a day. Good oral hygiene prevents tooth decay and gum disease.   The frequency of eye exams is based on your age, health, family medical history, use of contact lenses, and other factors. Follow your caregiver's recommendations for frequency of eye exams.   Eat a healthy diet. Foods like vegetables, fruits, whole grains, low-fat dairy products, and lean protein foods contain the nutrients you need without too many calories. Decrease your intake of foods high in solid fats, added sugars, and salt. Eat the right amount of calories for you.Get information about a proper diet from your caregiver, if necessary.   Regular physical exercise is one of the most important things you can do for your health. Most adults should get at least 150 minutes of moderate-intensity exercise (any activity that increases your heart rate and causes you to sweat) each week. In addition, most adults need muscle-strengthening exercises on 2 or more days a week.   Maintain a healthy weight. The body mass index (BMI) is a screening tool to identify possible weight problems. It provides an estimate of body fat based on height and weight. Your caregiver can help determine your BMI, and can help you achieve or maintain a healthy weight.For adults 20 years and older:   A BMI below 18.5 is considered underweight.   A BMI of 18.5 to 24.9 is normal.   A BMI of 25 to 29.9 is considered overweight.   A BMI of 30 and above is  considered obese.   Maintain normal blood lipids and cholesterol levels by exercising and minimizing your intake of saturated fat. Eat a balanced diet with plenty of fruit and vegetables. Blood tests for lipids and cholesterol should begin at age 20 and be repeated every 5 years. If your lipid or cholesterol levels are high, you are over 50, or you are at high risk for heart disease, you may need your cholesterol levels checked more frequently.Ongoing high lipid and cholesterol levels should be treated with medicines if diet and exercise are not effective.   If you smoke, find out from your caregiver how to quit. If you do not use tobacco, do not start.   If you are pregnant, do not drink alcohol. If you are breastfeeding, be very cautious about drinking alcohol. If you are not pregnant and choose to drink alcohol, do not exceed 1 drink per day. One drink is considered to be 12 ounces (355 mL) of beer, 5 ounces (148 mL) of wine, or 1.5 ounces (44 mL) of liquor.   Avoid use of street drugs. Do not share needles with anyone. Ask for help if you need support or instructions about stopping the use of drugs.   High blood pressure causes heart disease and increases the risk of stroke. Your blood pressure should be checked at least every 1 to 2 years. Ongoing high blood pressure should be treated with medicines if weight loss and exercise are not effective.   If you are 55 to 62   years old, ask your caregiver if you should take aspirin to prevent strokes.   Diabetes screening involves taking a blood sample to check your fasting blood sugar level. This should be done once every 3 years, after age 45, if you are within normal weight and without risk factors for diabetes. Testing should be considered at a younger age or be carried out more frequently if you are overweight and have at least 1 risk factor for diabetes.   Breast cancer screening is essential preventive care for women. You should practice "breast  self-awareness." This means understanding the normal appearance and feel of your breasts and may include breast self-examination. Any changes detected, no matter how small, should be reported to a caregiver. Women in their 20s and 30s should have a clinical breast exam (CBE) by a caregiver as part of a regular health exam every 1 to 3 years. After age 40, women should have a CBE every year. Starting at age 40, women should consider having a mammography (breast X-ray test) every year. Women who have a family history of breast cancer should talk to their caregiver about genetic screening. Women at a high risk of breast cancer should talk to their caregivers about having magnetic resonance imaging (MRI) and a mammography every year.   The Pap test is a screening test for cervical cancer. A Pap test can show cell changes on the cervix that might become cervical cancer if left untreated. A Pap test is a procedure in which cells are obtained and examined from the lower end of the uterus (cervix).   Women should have a Pap test starting at age 21.   Between ages 21 and 29, Pap tests should be repeated every 2 years.   Beginning at age 30, you should have a Pap test every 3 years as long as the past 3 Pap tests have been normal.   Some women have medical problems that increase the chance of getting cervical cancer. Talk to your caregiver about these problems. It is especially important to talk to your caregiver if a new problem develops soon after your last Pap test. In these cases, your caregiver may recommend more frequent screening and Pap tests.   The above recommendations are the same for women who have or have not gotten the vaccine for human papillomavirus (HPV).   If you had a hysterectomy for a problem that was not cancer or a condition that could lead to cancer, then you no longer need Pap tests. Even if you no longer need a Pap test, a regular exam is a good idea to make sure no other problems are  starting.   If you are between ages 65 and 70, and you have had normal Pap tests going back 10 years, you no longer need Pap tests. Even if you no longer need a Pap test, a regular exam is a good idea to make sure no other problems are starting.   If you have had past treatment for cervical cancer or a condition that could lead to cancer, you need Pap tests and screening for cancer for at least 20 years after your treatment.   If Pap tests have been discontinued, risk factors (such as a new sexual partner) need to be reassessed to determine if screening should be resumed.   The HPV test is an additional test that may be used for cervical cancer screening. The HPV test looks for the virus that can cause the cell changes on the cervix.   The cells collected during the Pap test can be tested for HPV. The HPV test could be used to screen women aged 30 years and older, and should be used in women of any age who have unclear Pap test results. After the age of 30, women should have HPV testing at the same frequency as a Pap test.   Colorectal cancer can be detected and often prevented. Most routine colorectal cancer screening begins at the age of 50 and continues through age 75. However, your caregiver may recommend screening at an earlier age if you have risk factors for colon cancer. On a yearly basis, your caregiver may provide home test kits to check for hidden blood in the stool. Use of a small camera at the end of a tube, to directly examine the colon (sigmoidoscopy or colonoscopy), can detect the earliest forms of colorectal cancer. Talk to your caregiver about this at age 50, when routine screening begins. Direct examination of the colon should be repeated every 5 to 10 years through age 75, unless early forms of pre-cancerous polyps or small growths are found.   Hepatitis C blood testing is recommended for all people born from 1945 through 1965 and any individual with known risks for hepatitis C.    Practice safe sex. Use condoms and avoid high-risk sexual practices to reduce the spread of sexually transmitted infections (STIs). STIs include gonorrhea, chlamydia, syphilis, trichomonas, herpes, HPV, and human immunodeficiency virus (HIV). Herpes, HIV, and HPV are viral illnesses that have no cure. They can result in disability, cancer, and death. Sexually active women aged 25 and younger should be checked for chlamydia. Older women with new or multiple partners should also be tested for chlamydia. Testing for other STIs is recommended if you are sexually active and at increased risk.   Osteoporosis is a disease in which the bones lose minerals and strength with aging. This can result in serious bone fractures. The risk of osteoporosis can be identified using a bone density scan. Women ages 65 and over and women at risk for fractures or osteoporosis should discuss screening with their caregivers. Ask your caregiver whether you should take a calcium supplement or vitamin D to reduce the rate of osteoporosis.   Menopause can be associated with physical symptoms and risks. Hormone replacement therapy is available to decrease symptoms and risks. You should talk to your caregiver about whether hormone replacement therapy is right for you.   Use sunscreen with sun protection factor (SPF) of 30 or more. Apply sunscreen liberally and repeatedly throughout the day. You should seek shade when your shadow is shorter than you. Protect yourself by wearing long sleeves, pants, a wide-brimmed hat, and sunglasses year round, whenever you are outdoors.   Once a month, do a whole body skin exam, using a mirror to look at the skin on your back. Notify your caregiver of new moles, moles that have irregular borders, moles that are larger than a pencil eraser, or moles that have changed in shape or color.   Stay current with required immunizations.   Influenza. You need a dose every fall (or winter). The composition of  the flu vaccine changes each year, so being vaccinated once is not enough.   Pneumococcal polysaccharide. You need 1 to 2 doses if you smoke cigarettes or if you have certain chronic medical conditions. You need 1 dose at age 65 (or older) if you have never been vaccinated.   Tetanus, diphtheria, pertussis (Tdap, Td). Get 1 dose of   Tdap vaccine if you are younger than age 65, are over 65 and have contact with an infant, are a healthcare worker, are pregnant, or simply want to be protected from whooping cough. After that, you need a Td booster dose every 10 years. Consult your caregiver if you have not had at least 3 tetanus and diphtheria-containing shots sometime in your life or have a deep or dirty wound.   HPV. You need this vaccine if you are a woman age 26 or younger. The vaccine is given in 3 doses over 6 months.   Measles, mumps, rubella (MMR). You need at least 1 dose of MMR if you were born in 1957 or later. You may also need a second dose.   Meningococcal. If you are age 19 to 21 and a first-year college student living in a residence hall, or have one of several medical conditions, you need to get vaccinated against meningococcal disease. You may also need additional booster doses.   Zoster (shingles). If you are age 60 or older, you should get this vaccine.   Varicella (chickenpox). If you have never had chickenpox or you were vaccinated but received only 1 dose, talk to your caregiver to find out if you need this vaccine.   Hepatitis A. You need this vaccine if you have a specific risk factor for hepatitis A virus infection or you simply wish to be protected from this disease. The vaccine is usually given as 2 doses, 6 to 18 months apart.   Hepatitis B. You need this vaccine if you have a specific risk factor for hepatitis B virus infection or you simply wish to be protected from this disease. The vaccine is given in 3 doses, usually over 6 months.  Preventive Services /  Frequency Ages 19 to 39  Blood pressure check.** / Every 1 to 2 years.   Lipid and cholesterol check.** / Every 5 years beginning at age 20.   Clinical breast exam.** / Every 3 years for women in their 20s and 30s.   Pap test.** / Every 2 years from ages 21 through 29. Every 3 years starting at age 30 through age 65 or 70 with a history of 3 consecutive normal Pap tests.   HPV screening.** / Every 3 years from ages 30 through ages 65 to 70 with a history of 3 consecutive normal Pap tests.   Hepatitis C blood test.** / For any individual with known risks for hepatitis C.   Skin self-exam. / Monthly.   Influenza immunization.** / Every year.   Pneumococcal polysaccharide immunization.** / 1 to 2 doses if you smoke cigarettes or if you have certain chronic medical conditions.   Tetanus, diphtheria, pertussis (Tdap, Td) immunization. / A one-time dose of Tdap vaccine. After that, you need a Td booster dose every 10 years.   HPV immunization. / 3 doses over 6 months, if you are 26 and younger.   Measles, mumps, rubella (MMR) immunization. / You need at least 1 dose of MMR if you were born in 1957 or later. You may also need a second dose.   Meningococcal immunization. / 1 dose if you are age 19 to 21 and a first-year college student living in a residence hall, or have one of several medical conditions, you need to get vaccinated against meningococcal disease. You may also need additional booster doses.   Varicella immunization.** / Consult your caregiver.   Hepatitis A immunization.** / Consult your caregiver. 2 doses, 6 to 18 months   apart.   Hepatitis B immunization.** / Consult your caregiver. 3 doses usually over 6 months.  Ages 40 to 64  Blood pressure check.** / Every 1 to 2 years.   Lipid and cholesterol check.** / Every 5 years beginning at age 20.   Clinical breast exam.** / Every year after age 40.   Mammogram.** / Every year beginning at age 40 and continuing for as  long as you are in good health. Consult with your caregiver.   Pap test.** / Every 3 years starting at age 30 through age 65 or 70 with a history of 3 consecutive normal Pap tests.   HPV screening.** / Every 3 years from ages 30 through ages 65 to 70 with a history of 3 consecutive normal Pap tests.   Fecal occult blood test (FOBT) of stool. / Every year beginning at age 50 and continuing until age 75. You may not need to do this test if you get a colonoscopy every 10 years.   Flexible sigmoidoscopy or colonoscopy.** / Every 5 years for a flexible sigmoidoscopy or every 10 years for a colonoscopy beginning at age 50 and continuing until age 75.   Hepatitis C blood test.** / For all people born from 1945 through 1965 and any individual with known risks for hepatitis C.   Skin self-exam. / Monthly.   Influenza immunization.** / Every year.   Pneumococcal polysaccharide immunization.** / 1 to 2 doses if you smoke cigarettes or if you have certain chronic medical conditions.   Tetanus, diphtheria, pertussis (Tdap, Td) immunization.** / A one-time dose of Tdap vaccine. After that, you need a Td booster dose every 10 years.   Measles, mumps, rubella (MMR) immunization. / You need at least 1 dose of MMR if you were born in 1957 or later. You may also need a second dose.   Varicella immunization.** / Consult your caregiver.   Meningococcal immunization.** / Consult your caregiver.   Hepatitis A immunization.** / Consult your caregiver. 2 doses, 6 to 18 months apart.   Hepatitis B immunization.** / Consult your caregiver. 3 doses, usually over 6 months.  Ages 65 and over  Blood pressure check.** / Every 1 to 2 years.   Lipid and cholesterol check.** / Every 5 years beginning at age 20.   Clinical breast exam.** / Every year after age 40.   Mammogram.** / Every year beginning at age 40 and continuing for as long as you are in good health. Consult with your caregiver.   Pap test.** /  Every 3 years starting at age 30 through age 65 or 70 with a 3 consecutive normal Pap tests. Testing can be stopped between 65 and 70 with 3 consecutive normal Pap tests and no abnormal Pap or HPV tests in the past 10 years.   HPV screening.** / Every 3 years from ages 30 through ages 65 or 70 with a history of 3 consecutive normal Pap tests. Testing can be stopped between 65 and 70 with 3 consecutive normal Pap tests and no abnormal Pap or HPV tests in the past 10 years.   Fecal occult blood test (FOBT) of stool. / Every year beginning at age 50 and continuing until age 75. You may not need to do this test if you get a colonoscopy every 10 years.   Flexible sigmoidoscopy or colonoscopy.** / Every 5 years for a flexible sigmoidoscopy or every 10 years for a colonoscopy beginning at age 50 and continuing until age 75.   Hepatitis   C blood test.** / For all people born from 1945 through 1965 and any individual with known risks for hepatitis C.   Osteoporosis screening.** / A one-time screening for women ages 65 and over and women at risk for fractures or osteoporosis.   Skin self-exam. / Monthly.   Influenza immunization.** / Every year.   Pneumococcal polysaccharide immunization.** / 1 dose at age 65 (or older) if you have never been vaccinated.   Tetanus, diphtheria, pertussis (Tdap, Td) immunization. / A one-time dose of Tdap vaccine if you are over 65 and have contact with an infant, are a healthcare worker, or simply want to be protected from whooping cough. After that, you need a Td booster dose every 10 years.   Varicella immunization.** / Consult your caregiver.   Meningococcal immunization.** / Consult your caregiver.   Hepatitis A immunization.** / Consult your caregiver. 2 doses, 6 to 18 months apart.   Hepatitis B immunization.** / Check with your caregiver. 3 doses, usually over 6 months.  ** Family history and personal history of risk and conditions may change your caregiver's  recommendations. Document Released: 08/08/2001 Document Revised: 06/01/2011 Document Reviewed: 11/07/2010 [employer] Patient Information 2012 [employer]. 

## 2011-12-25 NOTE — Assessment & Plan Note (Signed)
Worse--- change to dexilant and f/u GI

## 2011-12-25 NOTE — Assessment & Plan Note (Signed)
Check labs con't meds 

## 2011-12-25 NOTE — Assessment & Plan Note (Signed)
con't meds  Check labs 

## 2011-12-25 NOTE — Assessment & Plan Note (Signed)
Stable con't meds 

## 2011-12-27 ENCOUNTER — Other Ambulatory Visit: Payer: Self-pay | Admitting: Family Medicine

## 2011-12-29 ENCOUNTER — Other Ambulatory Visit: Payer: Self-pay | Admitting: Family Medicine

## 2011-12-29 DIAGNOSIS — E119 Type 2 diabetes mellitus without complications: Secondary | ICD-10-CM

## 2011-12-29 MED ORDER — GLUCOSE BLOOD VI STRP: ORAL_STRIP | Status: DC

## 2011-12-29 NOTE — Telephone Encounter (Signed)
rx for Contour meter, per hand written note recv., from [id number] Needs with instructions

## 2011-12-29 NOTE — Telephone Encounter (Signed)
Done

## 2012-01-03 ENCOUNTER — Other Ambulatory Visit: Payer: Self-pay | Admitting: Family Medicine

## 2012-01-15 ENCOUNTER — Other Ambulatory Visit: Payer: Self-pay | Admitting: Family Medicine

## 2012-01-18 ENCOUNTER — Encounter: Payer: Self-pay | Admitting: Gastroenterology

## 2012-01-18 ENCOUNTER — Ambulatory Visit (INDEPENDENT_AMBULATORY_CARE_PROVIDER_SITE_OTHER): Payer: BC Managed Care – PPO | Admitting: Gastroenterology

## 2012-01-18 VITALS — BP 132/70 | HR 96 | Ht 66.0 in | Wt 162.2 lb

## 2012-01-18 DIAGNOSIS — R079 Chest pain, unspecified: Secondary | ICD-10-CM

## 2012-01-18 DIAGNOSIS — K219 Gastro-esophageal reflux disease without esophagitis: Secondary | ICD-10-CM

## 2012-01-18 DIAGNOSIS — Z9089 Acquired absence of other organs: Secondary | ICD-10-CM

## 2012-01-18 DIAGNOSIS — Z9049 Acquired absence of other specified parts of digestive tract: Secondary | ICD-10-CM

## 2012-01-18 MED ORDER — DEXLANSOPRAZOLE 60 MG PO CPDR: 60.0000 mg | DELAYED_RELEASE_CAPSULE | Freq: Every day | ORAL | Status: DC

## 2012-01-18 NOTE — Progress Notes (Signed)
This is a 61 year old Caucasian female with chronic GERD. She recently had an acute viral illness with nausea and vomiting and worsening substernal chest pain with very brief dysphagia which resolved over 24 hours. She was apparently saying in a local emergency room and placed on by mouth Zantac. She chronically takes Prilosec 20 mg a day. Previous endoscopy was in 2003. The patient is status post cholecystectomy. She denies lower gastrointestinal problems, and is up-to-date on her colonoscopy. She denies use of NSAIDs, excessive alcohol, but does smoke. Patient does take when necessary Aleve, and is currently on Wellbutrin for smoking cessation, and a variety of antihypertensive medications and daily Elavil. She has not been a recent antibiotics, and denies any current dysphagia or painful swallowing. She denies a current cardiopulmonary complaints.  Current Medications, Allergies, Past Medical History, Past Surgical History, Family History and Social History were reviewed in [employer] record.  Pertinent Review of Systems Negative   Physical Exam: Blood pressure 132/70, pulse 96 and regular, and weight 162 pounds with BMI of 26.18. She is a healthy-appearing patient in no distress. I cannot appreciate stigmata of chronic liver disease. Her chest is clear and she appears to be in a regular rhythm without murmurs gallops or rubs. There is no hepatosplenomegaly, abdominal masses, tenderness, and bowel sounds are normal. Peripheral extremities are unremarkable, mental status is normal.    Assessment and Plan: Worsening acid reflux on small doses of Prilosec. I suspect she has had some recent flare of her GERD secondary to a viral-induced nausea and vomiting. I have placed her on Dexilant 60 mg a day, and she is to call in 2 weeks' time for progress report. I suspect she will improve with more potent acid suppression. If her symptoms continue, we will schedule followup endoscopic  exam. Otherwise she is to continue her medications as listed and reviewed by primary care. Standard antireflux regime reviewed with patient. No diagnosis found.

## 2012-01-18 NOTE — Patient Instructions (Addendum)
Stop taking your Prilosec and start Dexilant samples one tablet by mouth once daily x 2 weeks. Call us back and let us know which one your prefer to continue to take. Diet for GERD or PUD Nutrition therapy can help ease the discomfort of gastroesophageal reflux disease (GERD) and peptic ulcer disease (PUD).  HOME CARE INSTRUCTIONS   Eat your meals slowly, in a relaxed setting.   Eat 5 to 6 small meals per day.   If a food causes distress, stop eating it for a period of time.  FOODS TO AVOID  Coffee, regular or decaffeinated.   Cola beverages, regular or low calorie.   Tea, regular or decaffeinated.   Pepper.   Cocoa.   High fat foods, including meats.   Butter, margarine, hydrogenated oil (trans fats).   Peppermint or spearmint (if you have GERD).   Fruits and vegetables if not tolerated.   Alcohol.   Nicotine (smoking or chewing). This is one of the most potent stimulants to acid production in the gastrointestinal tract.   Any food that seems to aggravate your condition.  If you have questions regarding your diet, ask your caregiver or a registered dietitian. TIPS  Lying flat may make symptoms worse. Keep the head of your bed raised 6 to 9 inches (15 to 23 cm) by using a foam wedge or blocks under the legs of the bed.   Do not lay down until 3 hours after eating a meal.   Daily physical activity may help reduce symptoms.  MAKE SURE YOU:   Understand these instructions.   Will watch your condition.   Will get help right away if you are not doing well or get worse.  Document Released: 06/12/2005 Document Revised: 06/01/2011 Document Reviewed: 04/28/2011 [facility] Patient Information 2012 [place], [place].  cc: Loreen Freud, DO

## 2012-01-26 ENCOUNTER — Other Ambulatory Visit: Payer: Self-pay | Admitting: Family Medicine

## 2012-02-11 ENCOUNTER — Other Ambulatory Visit: Payer: Self-pay | Admitting: Family Medicine

## 2012-03-21 ENCOUNTER — Other Ambulatory Visit: Payer: Self-pay | Admitting: Family Medicine

## 2012-03-21 NOTE — Telephone Encounter (Signed)
Rx sent.    MW 

## 2012-03-22 ENCOUNTER — Other Ambulatory Visit: Payer: Self-pay | Admitting: Family Medicine

## 2012-04-12 ENCOUNTER — Other Ambulatory Visit: Payer: Self-pay | Admitting: Family Medicine

## 2012-04-12 DIAGNOSIS — Z1231 Encounter for screening mammogram for malignant neoplasm of breast: Secondary | ICD-10-CM

## 2012-04-25 ENCOUNTER — Other Ambulatory Visit: Payer: Self-pay | Admitting: Family Medicine

## 2012-04-25 NOTE — Telephone Encounter (Signed)
Last seen 12/25/11 and filled 01/26/12 # 60 with 2 refills.       KP

## 2012-05-08 ENCOUNTER — Ambulatory Visit
Admission: RE | Admit: 2012-05-08 | Discharge: 2012-05-08 | Disposition: A | Discharge status: Home / Self Care | Payer: BC Managed Care – PPO | Source: Ambulatory Visit | Attending: Family Medicine | Admitting: Family Medicine

## 2012-05-08 DIAGNOSIS — Z1231 Encounter for screening mammogram for malignant neoplasm of breast: Secondary | ICD-10-CM

## 2012-05-13 ENCOUNTER — Other Ambulatory Visit: Payer: Self-pay | Admitting: Family Medicine

## 2012-05-22 ENCOUNTER — Encounter: Payer: Self-pay | Admitting: Family Medicine

## 2012-05-22 ENCOUNTER — Ambulatory Visit (INDEPENDENT_AMBULATORY_CARE_PROVIDER_SITE_OTHER): Payer: BC Managed Care – PPO | Admitting: Family Medicine

## 2012-05-22 VITALS — BP 132/74 | HR 88 | Temp 98.2°F | Wt 161.8 lb

## 2012-05-22 DIAGNOSIS — R51 Headache: Secondary | ICD-10-CM

## 2012-05-22 DIAGNOSIS — Z23 Encounter for immunization: Secondary | ICD-10-CM

## 2012-05-22 DIAGNOSIS — J329 Chronic sinusitis, unspecified: Secondary | ICD-10-CM

## 2012-05-22 DIAGNOSIS — J019 Acute sinusitis, unspecified: Secondary | ICD-10-CM

## 2012-05-22 DIAGNOSIS — E1139 Type 2 diabetes mellitus with other diabetic ophthalmic complication: Secondary | ICD-10-CM

## 2012-05-22 DIAGNOSIS — E119 Type 2 diabetes mellitus without complications: Secondary | ICD-10-CM

## 2012-05-22 DIAGNOSIS — E785 Hyperlipidemia, unspecified: Secondary | ICD-10-CM

## 2012-05-22 DIAGNOSIS — I10 Essential (primary) hypertension: Secondary | ICD-10-CM

## 2012-05-22 LAB — BASIC METABOLIC PANEL
BUN: 10 mg/dL (ref 6–23)
CO2: 29 mEq/L (ref 19–32)
Calcium: 9.6 mg/dL (ref 8.4–10.5)
Chloride: 95 mEq/L — ABNORMAL LOW (ref 96–112)
Creatinine, Ser: 0.7 mg/dL (ref 0.4–1.2)
GFR: 96.4 mL/min (ref >60.00)
Glucose, Bld: 112 mg/dL — ABNORMAL HIGH (ref 70–99)
Potassium: 3.6 mEq/L (ref 3.5–5.1)
Sodium: 132 mEq/L — ABNORMAL LOW (ref 135–145)

## 2012-05-22 LAB — LIPID PANEL
Cholesterol: 143 mg/dL (ref 0–200)
HDL: 49 mg/dL (ref >39.00)
LDL Cholesterol: 71 mg/dL (ref 0–99)
Total CHOL/HDL Ratio: 3
Triglycerides: 113 mg/dL (ref 0.0–149.0)
VLDL: 22.6 mg/dL (ref 0.0–40.0)

## 2012-05-22 LAB — POCT GLYCOSYLATED HEMOGLOBIN (HGB A1C): Hemoglobin A1C: 6.8

## 2012-05-22 LAB — HEPATIC FUNCTION PANEL
ALT: 30 U/L (ref 0–35)
AST: 23 U/L (ref 0–37)
Albumin: 4.3 g/dL (ref 3.5–5.2)
Alkaline Phosphatase: 102 U/L (ref 39–117)
Bilirubin, Direct: 0.1 mg/dL (ref 0.0–0.3)
Total Bilirubin: 0.6 mg/dL (ref 0.3–1.2)
Total Protein: 7.5 g/dL (ref 6.0–8.3)

## 2012-05-22 MED ORDER — GLUCOSE BLOOD VI STRP: ORAL_STRIP | Status: DC

## 2012-05-22 MED ORDER — CEFUROXIME AXETIL 500 MG PO TABS: 500.0000 mg | ORAL_TABLET | Freq: Two times a day (BID) | ORAL | Status: AC

## 2012-05-22 NOTE — Progress Notes (Signed)
  Subjective:    Patient ID: Alyssa Ford, female    DOB: 28-Jun-1950, 61 y.o.   MRN: [id number]  HPI   Pt is c/o headaches over L eye.  Dentist told her it looked like sinus problems on xray.    HYPERTENSION Disease Monitoring Blood pressure range-not checking Chest pain- no      Dyspnea- no Medications Compliance- good Lightheadedness- no   Edema- no   DIABETES Disease Monitoring Blood Sugar ranges-120-130 fasting Polyuria- no New Visual problems- no Medications Compliance- good Hypoglycemic symptoms- no   HYPERLIPIDEMIA Disease Monitoring See symptoms for Hypertension Medications Compliance- good RUQ pain- no  Muscle aches- no  ROS See HPI above   PMH Smoking Status noted     Review of Systems    as above Objective:   Physical Exam  BP 132/74  Pulse 88  Temp 98.2 F (36.8 C) (Oral)  Wt 161 lb 12.8 oz (73.392 kg)  SpO2 96% General appearance: alert, cooperative, appears stated age and no distress Ears: normal TM's and external ear canals both ears Nose: no discharge, mild congestion, sinus tenderness bilateral Throat: lips, mucosa, and tongue normal; teeth and gums normal Neck: no adenopathy, no carotid bruit, no JVD, supple, symmetrical, trachea midline and thyroid not enlarged, symmetric, no tenderness/mass/nodules Lungs: clear to auscultation bilaterally Heart: S1, S2 normal Extremities: extremities normal, atraumatic, no cyanosis or edema Sensory exam of the foot is normal, tested with the monofilament. Good pulses, no lesions or ulcers, good peripheral pulses.      Assessment & Plan:

## 2012-05-22 NOTE — Patient Instructions (Signed)
Diabetes and Standards of Medical Care  Diabetes is complicated. You may find that your diabetes team includes a dietitian, nurse, diabetes educator, eye doctor, and more. To help everyone know what is going on and to help you get the care you deserve, the following schedule of care was developed to help keep you on track. Below are the tests, exams, vaccines, medicines, education, and plans you will need. A1c test  Performed at least 2 times a year if you are meeting treatment goals.  Performed 4 times a year if therapy has changed or if you are not meeting therapy/glycemic goals. Aspirin medicine  Take daily as directed by your caregiver. Blood pressure test  Performed at every routine medical visit. The goal is less than 130/80 mm/Hg. Dental exam  Get a dental exam at least 2 times a year. Dilated eye exam (retinal exam)  Type 1 diabetes: Get an exam within 5 years of diagnosis and then yearly.  Type 2 diabetes: Get an exam at diagnosis and then yearly. All exams thereafter can be extended to every 2 to 3 years if one or more exams have been normal. Foot care exam  Visual foot exams are performed at every routine medical visit. The exams check for cuts, injuries, or other problems with the feet.  A comprehensive foot exam should be done yearly. This includes visual inspection as well as assessing foot pulses and testing for loss of sensation. Kidney function test (urine microalbumin)  Performed once a year.  Type 1 diabetes: The first test is performed 5 years after diagnosis.  Type 2 diabetes: The first test is performed at the time of diagnosis.  A serum creatinine and estimated glomerular filtration rate (eGFR) test is done once a year to tell the level of chronic kidney disease (CKD), if present. Lipid profile (Cholesterol, HDL, LDL, Triglycerides)  Performed once a year for most people. If at low risk, may be assessed every 2 years.  The goal for LDL is less than 100  mg/dl. If at high risk, the goal is less than 70 mg/dl.  The goal for HDL is higher than 40 mg/dl for men and higher than 50 mg/dl for women.  The goal for triglycerides is less than 150 mg/dl. Flu vaccine, pneumonia vaccine, and hepatitis B vaccine  The flu vaccine is recommended yearly.  The pneumonia vaccine is generally given once in a lifetime. However, there are some instances where another vaccine is recommended. Check with your caregiver.  The hepatitis B vaccine is also recommended for adults with diabetes. Diabetes self-management education  Recommended at diagnosis and ongoing as needed. Treatment plan  Reviewed at every medical visit. Document Released: 04/09/2009 Document Revised: 09/04/2011 Document Reviewed: 12/13/2010 Exit[employer] Patient Information 2013 [employer].  

## 2012-05-23 NOTE — Assessment & Plan Note (Signed)
Stable con't meds 

## 2012-05-23 NOTE — Assessment & Plan Note (Signed)
Check labs con't meds 

## 2012-05-23 NOTE — Assessment & Plan Note (Signed)
con't meds  Check labs 

## 2012-05-23 NOTE — Assessment & Plan Note (Signed)
Seen on dentist xray abx Nasal steroid rto if headaches is not relieved with treatment

## 2012-06-10 ENCOUNTER — Other Ambulatory Visit: Payer: Self-pay | Admitting: Family Medicine

## 2012-06-20 ENCOUNTER — Telehealth: Payer: Self-pay | Admitting: Family Medicine

## 2012-06-20 DIAGNOSIS — R51 Headache: Secondary | ICD-10-CM

## 2012-06-20 NOTE — Telephone Encounter (Signed)
Patient Information:  Caller Name: Alyssa Ford  Phone: [phone]  Patient: Alyssa Ford, Alyssa Ford  Gender: Female  DOB: 02/22/1951  Age: 61 Years  PCP: Alyssa Ford.  Office Follow Up:  Does the office need to follow up with this patient?: Yes  Instructions For The Office: Wants CT scan scheduled before end of year. COBRA insurance $ 1500.00 deductible met.  RN Note:  Requesting to CT of FACE/HEAD scheduled.  Declines appt for evaluation. PLEASE CONTACT PATIENT.  Symptoms  Reason For Call & Symptoms: Patient states she has been having Headaches daily for a year.  She saw Dr. Laury Axon and was placed on antibiotic. She was told to call back if the medication did not work and they would schedule CT exam.  Patient requesting exam "before the end of the year due to insurance" Pain located at forehead down to left eye. Afebrile. Dentist thought she might be having Sinus Problems from recent xray . Patient states occasional green nasal discharge  Reviewed Health History In EMR: Yes  Reviewed Medications In EMR: Yes  Reviewed Allergies In EMR: Yes  Reviewed Surgeries / Procedures: No  Date of Onset of Symptoms: 05/27/2011  Treatments Tried: Aleve  Treatments Tried Worked: Yes  Guideline(s) Used:  Headache  Sinus Pain and Congestion  Disposition Per Guideline:   See Today or Tomorrow in Office  Reason For Disposition Reached:   Sinus congestion (pressure, fullness) present > 10 days  Advice Given:  Reassurance:   Here is some care advice that should help.  For a Stuffy Nose - Use Nasal Washes:  Introduction: Saline (salt water) nasal irrigation (nasal wash) is an effective and simple home remedy for treating stuffy nose and sinus congestion. The nose can be irrigated by pouring, spraying, or squirting salt water into the nose and then letting it run back out.  How it Helps: The salt water rinses out excess mucus, washes out any irritants (dust, allergens) that might be present, and moistens the  nasal cavity.  Methods: There are several ways to perform nasal irrigation. You can use a saline nasal spray bottle (available over-the-counter), a rubber ear syringe, a medical syringe without the needle, or a Neti Pot.  Hydration:  Drink plenty of liquids (6-8 glasses of water daily). If the air in your home is dry, use a cool mist humidifier  Call Back If:   Severe pain lasts longer than 2 hours after pain medicine  Sinus pain lasts longer than 1 day after starting treatment using nasal washes  Sinus congestion (fullness) lasts longer than 10 days  Fever lasts longer than 3 days  You become worse.  Patient Refused Recommendation:  Patient Refused Care Advice  would like CT SCAN scheduled before end of the Year

## 2012-06-20 NOTE — Telephone Encounter (Signed)
Pt states that it just hurts on her left side of forehead, eye sock. Pt notes sensitivity to light and sound once headache began. Pt denies any nausea.Pt indicated that she discuss this with PCP and advise her if previous treatment did not work that she would like to get CT scan since her deductible is so high..Please advise

## 2012-06-20 NOTE — Telephone Encounter (Signed)
Per Dr Laury Axon-- SINUSITIS - ACUTE-NOS - Loreen Freud, DO 05/23/2012 10:50 AM Signed  Seen on dentist xray  abx  Nasal steroid  rto if headaches is not relieved with treatment

## 2012-06-20 NOTE — Telephone Encounter (Signed)
Ok for noncontrast head CT- dx headache- since this is what pt claims was discussed w/ PCP.  No CT of sinuses at this time since that is a separate study.  Pt's sxs sound consistent w/ migraine.  Will need f/u OV to discuss after she has CT

## 2012-06-20 NOTE — Telephone Encounter (Signed)
Discuss with patient  

## 2012-06-20 NOTE — Telephone Encounter (Signed)
According to office note, pt was to return to office if HAs don't improve. Where is pt having her headaches- front, back, top, etc- is she having facial pain/pressure, do the HAs cause nausea or sensitivity to light or sound?  These are things we need to know prior to ordering studies

## 2012-06-21 ENCOUNTER — Other Ambulatory Visit: Payer: Self-pay | Admitting: Family Medicine

## 2012-06-21 NOTE — Telephone Encounter (Signed)
Last OV 11.27.13.  Last filled 6.2.13.  OK to refill?

## 2012-06-21 NOTE — Telephone Encounter (Signed)
Ok for #30, no refills 

## 2012-06-21 NOTE — Telephone Encounter (Signed)
Rx sent 

## 2012-06-24 ENCOUNTER — Encounter (HOSPITAL_COMMUNITY): Payer: Self-pay | Admitting: Emergency Medicine

## 2012-06-24 ENCOUNTER — Other Ambulatory Visit: Payer: Self-pay | Admitting: Family Medicine

## 2012-06-24 ENCOUNTER — Ambulatory Visit (INDEPENDENT_AMBULATORY_CARE_PROVIDER_SITE_OTHER)
Admission: RE | Admit: 2012-06-24 | Discharge: 2012-06-24 | Disposition: A | Discharge status: Home / Self Care | Payer: BC Managed Care – PPO | Source: Ambulatory Visit | Attending: Family Medicine | Admitting: Family Medicine

## 2012-06-24 ENCOUNTER — Emergency Department (HOSPITAL_COMMUNITY)
Admission: EM | Admit: 2012-06-24 | Discharge: 2012-06-24 | Disposition: A | Discharge status: Home / Self Care | Payer: BC Managed Care – PPO | Attending: Emergency Medicine | Admitting: Emergency Medicine

## 2012-06-24 DIAGNOSIS — Z9889 Other specified postprocedural states: Secondary | ICD-10-CM | POA: Insufficient documentation

## 2012-06-24 DIAGNOSIS — F17200 Nicotine dependence, unspecified, uncomplicated: Secondary | ICD-10-CM | POA: Insufficient documentation

## 2012-06-24 DIAGNOSIS — I10 Essential (primary) hypertension: Secondary | ICD-10-CM | POA: Insufficient documentation

## 2012-06-24 DIAGNOSIS — E119 Type 2 diabetes mellitus without complications: Secondary | ICD-10-CM | POA: Insufficient documentation

## 2012-06-24 DIAGNOSIS — Z8639 Personal history of other endocrine, nutritional and metabolic disease: Secondary | ICD-10-CM | POA: Insufficient documentation

## 2012-06-24 DIAGNOSIS — Z8739 Personal history of other diseases of the musculoskeletal system and connective tissue: Secondary | ICD-10-CM | POA: Insufficient documentation

## 2012-06-24 DIAGNOSIS — R51 Headache: Secondary | ICD-10-CM

## 2012-06-24 DIAGNOSIS — Z79899 Other long term (current) drug therapy: Secondary | ICD-10-CM | POA: Insufficient documentation

## 2012-06-24 DIAGNOSIS — M79601 Pain in right arm: Secondary | ICD-10-CM

## 2012-06-24 DIAGNOSIS — M79609 Pain in unspecified limb: Secondary | ICD-10-CM | POA: Insufficient documentation

## 2012-06-24 DIAGNOSIS — E785 Hyperlipidemia, unspecified: Secondary | ICD-10-CM | POA: Insufficient documentation

## 2012-06-24 DIAGNOSIS — Z8719 Personal history of other diseases of the digestive system: Secondary | ICD-10-CM | POA: Insufficient documentation

## 2012-06-24 DIAGNOSIS — K219 Gastro-esophageal reflux disease without esophagitis: Secondary | ICD-10-CM | POA: Insufficient documentation

## 2012-06-24 DIAGNOSIS — R21 Rash and other nonspecific skin eruption: Secondary | ICD-10-CM | POA: Insufficient documentation

## 2012-06-24 DIAGNOSIS — H409 Unspecified glaucoma: Secondary | ICD-10-CM | POA: Insufficient documentation

## 2012-06-24 DIAGNOSIS — Z862 Personal history of diseases of the blood and blood-forming organs and certain disorders involving the immune mechanism: Secondary | ICD-10-CM | POA: Insufficient documentation

## 2012-06-24 DIAGNOSIS — Z8679 Personal history of other diseases of the circulatory system: Secondary | ICD-10-CM | POA: Insufficient documentation

## 2012-06-24 MED ORDER — PREDNISONE 20 MG PO TABS: ORAL_TABLET | ORAL | Status: DC

## 2012-06-24 MED ORDER — HYDROMORPHONE HCL PF 1 MG/ML IJ SOLN
1.0000 mg | Freq: Once | INTRAMUSCULAR | Status: AC
  Administered 2012-06-24: 1 mg via INTRAMUSCULAR
  Filled 2012-06-24: qty 1

## 2012-06-24 MED ORDER — OXYCODONE-ACETAMINOPHEN 5-325 MG PO TABS: 2.0000 | ORAL_TABLET | ORAL | Status: DC | PRN

## 2012-06-24 NOTE — ED Provider Notes (Signed)
History  This chart was scribed for Donnetta Hutching, MD by Sofie Rower, ED Scribe. The patient was seen in room TR07C/TR07C and the patient's care was started at 7:43PM.    CSN: [id number]  Arrival date & time 06/24/12  1821   First MD Initiated Contact with Patient 06/24/12 1943      Chief Complaint  Patient presents with  . Arm Pain    (Consider location/radiation/quality/duration/timing/severity/associated sxs/prior treatment) The history is provided by the patient. No language interpreter was used.    Alyssa Ford is a 61 y.o. female , with a hx of cervical fusion, who presents to the Emergency Department complaining of gradual, progressively worsening, arm pain, located at the right arm, radiating downwards towards the right elbow, onset today (06/24/12). The pt reports she got a flu shot within her right arm before Thanksgiving, where shortly thereafter her arm became tender and now has become severely painful. The pt informs that when she experiences the arm pain, it radiates from her right cervical, neck region, downwards towards her right upper extremity. The pt has taken tramadol which does not provide relief of the arm pain.  The pt is a current everyday smoker (0.8 packs/day), in addition to drinking alcohol occasionally.   PCP is Dr. Laury Axon.    Past Medical History  Diagnosis Date  . Diabetes mellitus   . Hyperlipidemia   . Hypertension   . GERD (gastroesophageal reflux disease)   . Thyroid nodule   . Cataract   . Glaucoma   . Anemia   . Diverticulosis of colon (without mention of hemorrhage) 2003, 2008    Colonoscopy  . Hiatal hernia 2003    EGD   . Arthritis   . Chronic headaches     Past Surgical History  Procedure Date  . Cholecystectomy   . Carpal tunnel release 2006  . Carpal tunnel release 12/24/2007  . Cervical fusion 3/08    Family History  Problem Relation Age of Onset  . Dementia Mother   . Breast cancer Mother 36  . Kidney failure Mother 8      died from kidney failure from abx given to her for pneumonia  . Hyperlipidemia Mother   . Hypertension Mother   . Diabetes Mother   . Parkinsonism Father   . Diabetes Father 56  . Hypertension Sister   . Diabetes Brother   . Hypertension Brother   . Colon cancer Neg Hx     History  Substance Use Topics  . Smoking status: Current Every Day Smoker -- 0.8 packs/day    Types: Cigarettes  . Smokeless tobacco: Never Used     Comment: e cig  . Alcohol Use: Yes     Comment: occ    OB History    Grav Para Term Preterm Abortions TAB SAB Ect Mult Living                  Review of Systems  Musculoskeletal: Positive for arthralgias.  All other systems reviewed and are negative.    Allergies  Codeine  Home Medications   Current Outpatient Rx  Name  Route  Sig  Dispense  Refill  . AMITRIPTYLINE HCL 25 MG PO TABS      TAKE TWO TABLETS BY MOUTH EVERY DAY   60 tablet   1   . ATORVASTATIN CALCIUM 80 MG PO TABS      TAKE ONE TABLET BY MOUTH EVERY DAY   30 tablet   5   .  BIOTIN PO   Oral   Take by mouth as directed.         . BUPROPION HCL ER (XL) 150 MG PO TB24      TAKE ONE TABLET BY MOUTH IN THE MORNING FOR 7 DAYS, THEN TWO TABLETS IN THE MORNING   60 tablet   5   . DEXLANSOPRAZOLE 60 MG PO CPDR   Oral   Take 1 capsule (60 mg total) by mouth daily.   14 capsule   0   . DIAZEPAM 5 MG PO TABS      TAKE ONE TABLET BY MOUTH AT BEDTIME AS NEEDED FOR ANXIETY   30 tablet   0   . GLUCOSE BLOOD VI STRP      Use as instructed   100 each   12     One touch ultra. Check once daily as instructed. D ...   . HYDROCHLOROTHIAZIDE 25 MG PO TABS      TAKE ONE TABLET BY MOUTH EVERY DAY   30 tablet   0   . JANUVIA 100 MG PO TABS      TAKE ONE TABLET BY MOUTH EVERY DAY   30 each   5   . LISINOPRIL 5 MG PO TABS   Oral   Take 1 tablet (5 mg total) by mouth daily.   30 tablet   5   . LUMIGAN 0.01 % OP SOLN   Both Eyes   Place 1 drop into both eyes at  bedtime.          Marland Kitchen METFORMIN HCL 850 MG PO TABS      2 po qam and 1 po qpm   90 tablet   5   . METFORMIN HCL 850 MG PO TABS      TAKE TWO TABLETS BY MOUTH IN THE MORNING AND ONE IN THE EVENING   90 tablet   1   . ALEVE PO   Oral   Take by mouth as needed.         . NON FORMULARY      Liquid hair drops         . OMEPRAZOLE 20 MG PO CPDR   Oral   Take 20 mg by mouth daily.             BP 127/71  Pulse 94  Temp 98.2 F (36.8 C) (Oral)  Resp 16  SpO2 95%  Physical Exam  Nursing note and vitals reviewed. Constitutional: She is oriented to person, place, and time. She appears well-developed and well-nourished.  HENT:  Head: Atraumatic.  Nose: Nose normal.  Neck:       Full ROM of neck.   Musculoskeletal: Normal range of motion. She exhibits tenderness.       Tender within the lateral, mid right bicep. No abscess' detected. Full ROM of right arm and right hand.   Neurological: She is alert and oriented to person, place, and time.  Skin: Skin is warm and dry.  Psychiatric: She has a normal mood and affect.    ED Course  Procedures (including critical care time)  DIAGNOSTIC STUDIES: Oxygen Saturation is 95% on room air, normal by my interpretation.    COORDINATION OF CARE:  8:22 PM- Treatment plan discussed with patient. Pt agrees with treatment.      Labs Reviewed - No data to display Ct [facility] Wo Cm  06/24/2012  *RADIOLOGY REPORT*  Clinical Data:  Headache.  Drainage.  CT PARANASAL  SINUS LIMITED WITHOUT CONTRAST  Technique:  Multidetector CT images of the paranasal sinuses were obtained in a single plane without contrast.  Comparison:  08/24/2011  Findings:  Frontal, ethmoid, maxillary and sphenoid sinuses are entirely clear.  No mucosal thickening, fluid, polyp, cyst or mass. Ostiomeatal complexes are fortuitously demonstrated and appear normal and patent.  IMPRESSION: Negative exam.  No evidence of sinus inflammatory disease.   Original  Report Authenticated By: Paulina Fusi, M.D.      No diagnosis found.    MDM  Suspect radicular pain.  No evidence of cellulitis or abscess.  Rx Percocet #20 and prednisone for 5 days. Referred to primary care.        I personally performed the services described in this documentation, which was scribed in my presence. The recorded information has been reviewed and is accurate.    Donnetta Hutching, MD 06/24/12 2039

## 2012-06-24 NOTE — ED Notes (Addendum)
Pt here with c/o right arm pain that got progressively worse today that she could not use the arm. She states had CT scan today at [facility] because headache. Pt reports her was sore after she got flu shot in November that had not gone away since. Denies headache now. Pt reports taking Tramadol/apap 375-325mg  for pain.

## 2012-06-24 NOTE — ED Notes (Signed)
Pt.states that she had a Flu shot in her RUE sometime in Nov. And it made he arm very sore. She now comes in with c/o Pain to the point she can't lift arm. She does not that in the past she had a ruptured cervical disc that affected her the same way but it affected her left arm that way.

## 2012-06-25 ENCOUNTER — Encounter (HOSPITAL_COMMUNITY): Payer: Self-pay | Admitting: Emergency Medicine

## 2012-06-25 ENCOUNTER — Telehealth: Payer: Self-pay | Admitting: Family Medicine

## 2012-06-25 DIAGNOSIS — M25519 Pain in unspecified shoulder: Secondary | ICD-10-CM | POA: Insufficient documentation

## 2012-06-25 DIAGNOSIS — Z872 Personal history of diseases of the skin and subcutaneous tissue: Secondary | ICD-10-CM | POA: Insufficient documentation

## 2012-06-25 DIAGNOSIS — E785 Hyperlipidemia, unspecified: Secondary | ICD-10-CM | POA: Insufficient documentation

## 2012-06-25 DIAGNOSIS — M129 Arthropathy, unspecified: Secondary | ICD-10-CM | POA: Insufficient documentation

## 2012-06-25 DIAGNOSIS — E119 Type 2 diabetes mellitus without complications: Secondary | ICD-10-CM | POA: Insufficient documentation

## 2012-06-25 DIAGNOSIS — I10 Essential (primary) hypertension: Secondary | ICD-10-CM | POA: Insufficient documentation

## 2012-06-25 DIAGNOSIS — R51 Headache: Secondary | ICD-10-CM | POA: Insufficient documentation

## 2012-06-25 DIAGNOSIS — F17200 Nicotine dependence, unspecified, uncomplicated: Secondary | ICD-10-CM | POA: Insufficient documentation

## 2012-06-25 DIAGNOSIS — Z862 Personal history of diseases of the blood and blood-forming organs and certain disorders involving the immune mechanism: Secondary | ICD-10-CM | POA: Insufficient documentation

## 2012-06-25 DIAGNOSIS — Z79899 Other long term (current) drug therapy: Secondary | ICD-10-CM | POA: Insufficient documentation

## 2012-06-25 DIAGNOSIS — G8929 Other chronic pain: Secondary | ICD-10-CM | POA: Insufficient documentation

## 2012-06-25 DIAGNOSIS — H269 Unspecified cataract: Secondary | ICD-10-CM | POA: Insufficient documentation

## 2012-06-25 DIAGNOSIS — Z8639 Personal history of other endocrine, nutritional and metabolic disease: Secondary | ICD-10-CM | POA: Insufficient documentation

## 2012-06-25 DIAGNOSIS — Z8719 Personal history of other diseases of the digestive system: Secondary | ICD-10-CM | POA: Insufficient documentation

## 2012-06-25 DIAGNOSIS — H409 Unspecified glaucoma: Secondary | ICD-10-CM | POA: Insufficient documentation

## 2012-06-25 DIAGNOSIS — K219 Gastro-esophageal reflux disease without esophagitis: Secondary | ICD-10-CM | POA: Insufficient documentation

## 2012-06-25 DIAGNOSIS — Z791 Long term (current) use of non-steroidal anti-inflammatories (NSAID): Secondary | ICD-10-CM | POA: Insufficient documentation

## 2012-06-25 NOTE — Telephone Encounter (Signed)
Patient Information:  Caller Name: Rosela  Phone: [phone]  Patient: Alyssa, Ford  Gender: Female  DOB: August 13, 1950  Age: 61 Years  PCP: Lelon Perla.  Office Follow Up:  Does the office need to follow up with this patient?: No  Instructions For The Office: N/A  RN Note:  Pain is moderate and relieved by pain medicine. Is taking 2 tabs every 4hrs for pain.  Symptoms  Reason For Call & Symptoms: Was seen in ED 12-30 due to pain in arm. Was prescribed Prednisone and Oxycodone and told to follow up with PCP. Has appointment 1-3 but is fearful may run out of pain medicine. Wants to know what to do if runs out of medicine.  Reviewed Health History In EMR: Yes  Reviewed Medications In EMR: Yes  Reviewed Allergies In EMR: Yes  Reviewed Surgeries / Procedures: Yes  Date of Onset of Symptoms: 06/24/2012  Treatments Tried: Oxycodone prescribed in ED  Treatments Tried Worked: Yes  Guideline(s) Used:  Arm Pain  Disposition Per Guideline:  See provider within 3 Days   Reason For Disposition Reached:   Arm pain  Advice Given:  Appointment scheduled for 1-3 at 2:45 with Dr. Laury Axon.

## 2012-06-25 NOTE — ED Notes (Signed)
PT. REPORTS PERSISTENT RIGHT ARM PAIN ONSET YESTERDAY SEEN HERE YESTERDAY PRESCRIBED WITH PREDNISONE/ PERCOCET WITH NO RELIEF , DENIES INJURY .

## 2012-06-25 NOTE — Telephone Encounter (Signed)
Advice Given:  Appointment scheduled for 1-3 at 2:45 with Dr. Laury Axon

## 2012-06-26 ENCOUNTER — Emergency Department (HOSPITAL_COMMUNITY)
Admission: EM | Admit: 2012-06-26 | Discharge: 2012-06-26 | Disposition: A | Discharge status: Home / Self Care | Payer: BC Managed Care – PPO | Attending: Emergency Medicine | Admitting: Emergency Medicine

## 2012-06-26 DIAGNOSIS — M25511 Pain in right shoulder: Secondary | ICD-10-CM

## 2012-06-26 DIAGNOSIS — M79601 Pain in right arm: Secondary | ICD-10-CM

## 2012-06-26 MED ORDER — HYDROMORPHONE HCL PF 1 MG/ML IJ SOLN
1.0000 mg | Freq: Once | INTRAMUSCULAR | Status: DC
  Filled 2012-06-26: qty 1

## 2012-06-26 MED ORDER — KETOROLAC TROMETHAMINE 60 MG/2ML IM SOLN
60.0000 mg | Freq: Once | INTRAMUSCULAR | Status: AC
  Administered 2012-06-26: 60 mg via INTRAMUSCULAR
  Filled 2012-06-26: qty 2

## 2012-06-26 MED ORDER — DIAZEPAM 5 MG PO TABS: 5.0000 mg | ORAL_TABLET | Freq: Three times a day (TID) | ORAL | Status: DC | PRN

## 2012-06-26 MED ORDER — DIAZEPAM 5 MG PO TABS
5.0000 mg | ORAL_TABLET | Freq: Once | ORAL | Status: AC
  Administered 2012-06-26: 5 mg via ORAL
  Filled 2012-06-26: qty 1

## 2012-06-26 MED ORDER — HYDROMORPHONE HCL PF 1 MG/ML IJ SOLN
1.0000 mg | Freq: Once | INTRAMUSCULAR | Status: AC
  Administered 2012-06-26: 1 mg via INTRAMUSCULAR

## 2012-06-26 NOTE — ED Provider Notes (Signed)
History     CSN: [id number]  Arrival date & time 06/25/12  2333   First MD Initiated Contact with Patient 06/26/12 [id number]      Chief Complaint  Patient presents with  . Arm Pain    (Consider location/radiation/quality/duration/timing/severity/associated sxs/prior treatment) HPI 62 yo female presents to the ER c/o right arm pain.  Pt with arm pain ongoing for the last several weeks, acutely worse since yesterday.  No known trauma, but did have flu shot around Thanksgiving prior to pain.  Pain is worse with movement at the shoulder, especially with abduction, extension.  Sxs also feel like radicular pain she had on the left with prior disk herniation.  Pain goes from shoulder to hand.  Pt was seen for same yesterday, had dilaudid shot and given rx for prednisone and percocet.  Pt has not yet been able to get MRI scheduled through pcm or nsgy.  She is taking medications as prescribed.   Past Medical History  Diagnosis Date  . Diabetes mellitus   . Hyperlipidemia   . Hypertension   . GERD (gastroesophageal reflux disease)   . Thyroid nodule   . Cataract   . Glaucoma   . Anemia   . Diverticulosis of colon (without mention of hemorrhage) 2003, 2008    Colonoscopy  . Hiatal hernia 2003    EGD   . Arthritis   . Chronic headaches     Past Surgical History  Procedure Date  . Cholecystectomy   . Carpal tunnel release 2006  . Carpal tunnel release 12/24/2007  . Cervical fusion 3/08    Family History  Problem Relation Age of Onset  . Dementia Mother   . Breast cancer Mother 30  . Kidney failure Mother 3    died from kidney failure from abx given to her for pneumonia  . Hyperlipidemia Mother   . Hypertension Mother   . Diabetes Mother   . Parkinsonism Father   . Diabetes Father 74  . Hypertension Sister   . Diabetes Brother   . Hypertension Brother   . Colon cancer Neg Hx     History  Substance Use Topics  . Smoking status: Current Every Day Smoker -- 0.8 packs/day   Types: Cigarettes  . Smokeless tobacco: Never Used     Comment: e cig  . Alcohol Use: Yes     Comment: occ    OB History    Grav Para Term Preterm Abortions TAB SAB Ect Mult Living                  Review of Systems  All other systems reviewed and are negative.    Allergies  Codeine  Home Medications   Current Outpatient Rx  Name  Route  Sig  Dispense  Refill  . AMITRIPTYLINE HCL 25 MG PO TABS   Oral   Take 50 mg by mouth at bedtime.         . ATORVASTATIN CALCIUM 80 MG PO TABS   Oral   Take 80 mg by mouth daily.         Marland Kitchen BIOTIN PO   Oral   Take 1 tablet by mouth daily.          . BUPROPION HCL ER (XL) 150 MG PO TB24   Oral   Take 150 mg by mouth daily.         Marland Kitchen HYDROCHLOROTHIAZIDE 25 MG PO TABS   Oral   Take 25 mg by mouth daily.         Marland Kitchen  LUMIGAN 0.01 % OP SOLN   Both Eyes   Place 1 drop into both eyes at bedtime.          Marland Kitchen METFORMIN HCL 850 MG PO TABS   Oral   Take 850-1,700 mg by mouth 2 (two) times daily with a meal. 2 tabs in the am, 1 tab in the pm         . ALEVE PO   Oral   Take 2 tablets by mouth daily.         . NON FORMULARY   Topical   Apply 1 drop topically 2 (two) times a week. Liquid hair drops         . OMEPRAZOLE 20 MG PO CPDR   Oral   Take 20 mg by mouth daily.           . OXYCODONE-ACETAMINOPHEN 5-325 MG PO TABS   Oral   Take 2 tablets by mouth every 4 (four) hours as needed for pain.   20 tablet   0   . PREDNISONE 20 MG PO TABS      3 tabs po day one, then 2 tabs daily x 4 days   11 tablet   0   . SITAGLIPTIN PHOSPHATE 100 MG PO TABS   Oral   Take 100 mg by mouth daily.         Marland Kitchen DIAZEPAM 5 MG PO TABS   Oral   Take 1 tablet (5 mg total) by mouth every 8 (eight) hours as needed (muscle spasm). For sleep   20 tablet   0     BP 164/84  Pulse 102  Temp 98 F (36.7 C) (Oral)  Resp 18  SpO2 97%  Physical Exam  Nursing note and vitals reviewed. Constitutional: She appears  well-developed and well-nourished. She appears distressed (uncomfortable appearing).  Cardiovascular: Normal rate, regular rhythm, normal heart sounds and intact distal pulses.  Exam reveals no gallop and no friction rub.   No murmur heard. Pulmonary/Chest: Effort normal and breath sounds normal. No respiratory distress. She has no wheezes. She has no rales. She exhibits no tenderness.  Musculoskeletal: She exhibits tenderness (ttp over right rotator cuff and lateral shoulder.  Limited ROM due to pain.  No deformity of soft tissue swelling noted.  No rash.  NVI).    ED Course  Procedures (including critical care time)  Labs Reviewed - No data to display Ct [facility] Wo Cm  06/24/2012  *RADIOLOGY REPORT*  Clinical Data:  Headache.  Drainage.  CT PARANASAL SINUS LIMITED WITHOUT CONTRAST  Technique:  Multidetector CT images of the paranasal sinuses were obtained in a single plane without contrast.  Comparison:  08/24/2011  Findings:  Frontal, ethmoid, maxillary and sphenoid sinuses are entirely clear.  No mucosal thickening, fluid, polyp, cyst or mass. Ostiomeatal complexes are fortuitously demonstrated and appear normal and patent.  IMPRESSION: Negative exam.  No evidence of sinus inflammatory disease.   Original Report Authenticated By: Paulina Fusi, M.D.      1. Shoulder pain, right   2. Right arm pain       MDM  62 yo female with right arm pain, seen for same yesterday.  Will refer to ortho as well as her pcm for further workup.  Pt counseled it will take time for steroids to help with acute inflammation of pain.          Olivia Mackie, MD 06/26/12 2218

## 2012-06-26 NOTE — Progress Notes (Signed)
Orthopedic Tech Progress Note Patient Details:  Alyssa Ford 01-24-1951 [id number]  Ortho Devices Type of Ortho Device: Shoulder immobilizer Ortho Device/Splint Location: RIGHT IMMOBILIZER SLING Ortho Device/Splint Interventions: Application   Cammer, Mickie Bail 06/26/2012, 3:16 AM

## 2012-06-28 ENCOUNTER — Ambulatory Visit (INDEPENDENT_AMBULATORY_CARE_PROVIDER_SITE_OTHER): Payer: BC Managed Care – PPO | Admitting: Family Medicine

## 2012-06-28 ENCOUNTER — Encounter: Payer: Self-pay | Admitting: Family Medicine

## 2012-06-28 VITALS — BP 144/70 | HR 110 | Temp 98.4°F | Wt 163.0 lb

## 2012-06-28 DIAGNOSIS — M25519 Pain in unspecified shoulder: Secondary | ICD-10-CM

## 2012-06-28 DIAGNOSIS — M25511 Pain in right shoulder: Secondary | ICD-10-CM

## 2012-06-28 NOTE — Progress Notes (Signed)
  Subjective:    Glendia Olshefski Encalade is a 62 y.o. female who presents with right shoulder pain. The symptoms began several days ago. Aggravating factors: no known event. Pain is located R glen-humoral area. Discomfort is described as aching and throbbing. Symptoms are exacerbated by overhead movements. Evaluation to date: plain films: normal. Therapy to date includes: ice, avoidance of offending activity, opioids which are somewhat effective and sling.  Pt as seen in Er 2x---er notes reviewed.  The following portions of the patient's history were reviewed and updated as appropriate: allergies, current medications, past family history, past medical history, past social history, past surgical history and problem list.  Review of Systems Pertinent items are noted in HPI.   Objective:    BP 144/70  Pulse 110  Temp 98.4 F (36.9 C) (Oral)  Wt 163 lb (73.936 kg)  SpO2 95% Right shoulder: full rom ----- but pain with movement  Left shoulder: normal active ROM, no tenderness, no impingement sign     Assessment:    Right shoulder pain    Plan:    Educational material distributed. Reduction in offending activity. Rest, ice, compression, and elevation (RICE) therapy. Orthopedics referral.

## 2012-06-28 NOTE — Patient Instructions (Signed)
Shoulder Pain  The shoulder is a ball and socket joint. Many muscles and tendons hold the joint together. Many types of injuries and medical problems can cause pain in one or more parts of the shoulder.  HOME CARE   If your doctor feels the problem is not serious, it may help to do the following:   Put ice on the area.   Put ice in a plastic bag.   Place a towel between your skin and the bag.   Leave the ice on for 15 to 20 minutes, 3 to 4 times a day.   Do this for the first 2 day or as told by your doctor.   Stop using cold packs if they do not help with the pain.   Do not take your sling off (except to shower or bathe) until you see your doctor. When taking off the sling, move the arm as little as possible.   Take medicine as told by your doctor.   Keep all follow-up appointments.  GET HELP RIGHT AWAY IF:    The arm, hand, or fingers are numb or tingling.   The arm, hand, or fingers are puffy (swollen), painful, or turn white or blue.   You have trouble moving your hand and fingers on the injured side.   You have chest pain or shortness of breath.   New pain happens in the arm, hand, or fingers.   The hand or fingers on the injured side become cold.   The medicine is not helping the pain go away.  MAKE SURE YOU:    Understand these instructions.   Will watch your condition.   Will get help right away if you are not doing well or get worse.  Document Released: 11/29/2007 Document Revised: 09/04/2011 Document Reviewed: 11/29/2007  [employer] Patient Information 2013 [employer].

## 2012-06-30 ENCOUNTER — Other Ambulatory Visit: Payer: Self-pay | Admitting: Family Medicine

## 2012-08-01 ENCOUNTER — Other Ambulatory Visit: Payer: Self-pay | Admitting: Family Medicine

## 2012-08-01 NOTE — Telephone Encounter (Signed)
Last seen 06/28/12 and filled 06/21/12 # 30. Please advise      KP

## 2012-08-02 ENCOUNTER — Telehealth: Payer: Self-pay | Admitting: Family Medicine

## 2012-08-02 NOTE — Telephone Encounter (Signed)
Rx re-faxed    KP 

## 2012-08-02 NOTE — Telephone Encounter (Signed)
Refill: Diazepam 5 mg tab. Take one tablet by mouth every 8 hours as needed for muscle spasm/for sleep. Qty 20. Last fill 06-26-12

## 2012-09-15 ENCOUNTER — Other Ambulatory Visit: Payer: Self-pay | Admitting: Family Medicine

## 2012-10-07 ENCOUNTER — Telehealth: Payer: Self-pay | Admitting: Family Medicine

## 2012-10-07 ENCOUNTER — Other Ambulatory Visit: Payer: Self-pay | Admitting: Family Medicine

## 2012-10-07 NOTE — Telephone Encounter (Signed)
Last seen 06/28/12 and filled 08/01/12 # 30. Please advise     KP

## 2012-10-07 NOTE — Telephone Encounter (Signed)
Refill x1 

## 2012-10-07 NOTE — Telephone Encounter (Signed)
Refill- diazepam 5mg  tab. Take one tablet by mouth every 8 hrs as needed for muscle spasms/for sleep. Qty 20 last fill 1.1.14

## 2012-10-07 NOTE — Telephone Encounter (Signed)
Last seen 06/28/12 and filled 06/30/12 #60 with 2 refills. Please advise     KP

## 2012-10-08 MED ORDER — DIAZEPAM 5 MG PO TABS: ORAL_TABLET | ORAL | Status: DC

## 2012-10-23 ENCOUNTER — Other Ambulatory Visit: Payer: Self-pay | Admitting: Family Medicine

## 2012-11-07 ENCOUNTER — Other Ambulatory Visit: Payer: Self-pay | Admitting: Family Medicine

## 2012-11-09 ENCOUNTER — Other Ambulatory Visit: Payer: Self-pay | Admitting: Family Medicine

## 2012-11-27 ENCOUNTER — Other Ambulatory Visit: Payer: Self-pay | Admitting: Family Medicine

## 2012-11-27 NOTE — Telephone Encounter (Signed)
Last seen 06/28/12 and filled 10/08/12 #30. Please advise      KP

## 2012-12-04 ENCOUNTER — Telehealth: Payer: Self-pay | Admitting: Family Medicine

## 2012-12-04 NOTE — Telephone Encounter (Signed)
Will wait for formulary

## 2012-12-04 NOTE — Telephone Encounter (Signed)
msg left to call back with husband. Patient will need to get a formulary for her insurance company.      KP

## 2012-12-04 NOTE — Telephone Encounter (Signed)
Patient is calling back. States that [facility] will not send formulary after she asked them to.

## 2012-12-04 NOTE — Telephone Encounter (Signed)
Patient called stating her [employer] is too expensive and she would like something else. She is having a copy of her formulary sent to our office.

## 2012-12-09 NOTE — Telephone Encounter (Signed)
Tried calling the patient but her line was busy-    KP

## 2012-12-09 NOTE — Telephone Encounter (Signed)
flimepride 2 mg 1 po qd #30  2 refills

## 2012-12-09 NOTE — Telephone Encounter (Signed)
Formulary on the ledge. Please advise      KP

## 2012-12-11 MED ORDER — GLIMEPIRIDE 2 MG PO TABS: 2.0000 mg | ORAL_TABLET | Freq: Every day | ORAL | Status: DC

## 2012-12-11 NOTE — Addendum Note (Signed)
Addended by: Arnette Norris on: 12/11/2012 04:13 PM   Modules accepted: Orders, Medications

## 2012-12-11 NOTE — Telephone Encounter (Signed)
Patient aware and Rx has been sent.     KP

## 2013-01-02 ENCOUNTER — Other Ambulatory Visit: Payer: Self-pay

## 2013-01-03 ENCOUNTER — Other Ambulatory Visit: Payer: Self-pay | Admitting: Family Medicine

## 2013-01-09 LAB — HM DIABETES EYE EXAM

## 2013-01-31 ENCOUNTER — Other Ambulatory Visit: Payer: Self-pay | Admitting: Family Medicine

## 2013-02-04 ENCOUNTER — Encounter: Payer: Self-pay | Admitting: Lab

## 2013-02-05 ENCOUNTER — Ambulatory Visit (INDEPENDENT_AMBULATORY_CARE_PROVIDER_SITE_OTHER): Payer: BC Managed Care – PPO | Admitting: Family Medicine

## 2013-02-05 ENCOUNTER — Encounter: Payer: Self-pay | Admitting: Family Medicine

## 2013-02-05 VITALS — BP 140/70 | HR 84 | Temp 99.0°F | Ht 66.5 in | Wt 161.4 lb

## 2013-02-05 DIAGNOSIS — N390 Urinary tract infection, site not specified: Secondary | ICD-10-CM

## 2013-02-05 DIAGNOSIS — I10 Essential (primary) hypertension: Secondary | ICD-10-CM

## 2013-02-05 DIAGNOSIS — E1139 Type 2 diabetes mellitus with other diabetic ophthalmic complication: Secondary | ICD-10-CM

## 2013-02-05 DIAGNOSIS — E2839 Other primary ovarian failure: Secondary | ICD-10-CM

## 2013-02-05 DIAGNOSIS — F411 Generalized anxiety disorder: Secondary | ICD-10-CM

## 2013-02-05 DIAGNOSIS — M549 Dorsalgia, unspecified: Secondary | ICD-10-CM

## 2013-02-05 DIAGNOSIS — Z0000 Encounter for general adult medical examination without abnormal findings: Secondary | ICD-10-CM

## 2013-02-05 DIAGNOSIS — E1136 Type 2 diabetes mellitus with diabetic cataract: Secondary | ICD-10-CM

## 2013-02-05 DIAGNOSIS — Z1239 Encounter for other screening for malignant neoplasm of breast: Secondary | ICD-10-CM

## 2013-02-05 DIAGNOSIS — E785 Hyperlipidemia, unspecified: Secondary | ICD-10-CM

## 2013-02-05 LAB — CBC WITH DIFFERENTIAL/PLATELET
Basophils Absolute: 0 10*3/uL (ref 0.0–0.1)
Basophils Relative: 0.3 % (ref 0.0–3.0)
Eosinophils Absolute: 0.1 10*3/uL (ref 0.0–0.7)
Eosinophils Relative: 0.4 % (ref 0.0–5.0)
HCT: 41 % (ref 36.0–46.0)
Hemoglobin: 13.5 g/dL (ref 12.0–15.0)
Lymphocytes Relative: 21.8 % (ref 12.0–46.0)
Lymphs Abs: 3.3 10*3/uL (ref 0.7–4.0)
MCHC: 32.9 g/dL (ref 30.0–36.0)
MCV: 90.2 fl (ref 78.0–100.0)
Monocytes Absolute: 1.1 10*3/uL — ABNORMAL HIGH (ref 0.1–1.0)
Monocytes Relative: 7.1 % (ref 3.0–12.0)
Neutro Abs: 10.6 10*3/uL — ABNORMAL HIGH (ref 1.4–7.7)
Neutrophils Relative %: 70.4 % (ref 43.0–77.0)
Platelets: 302 10*3/uL (ref 150.0–400.0)
RBC: 4.55 Mil/uL (ref 3.87–5.11)
RDW: 14.9 % — ABNORMAL HIGH (ref 11.5–14.6)
WBC: 15.1 10*3/uL — ABNORMAL HIGH (ref 4.5–10.5)

## 2013-02-05 LAB — BASIC METABOLIC PANEL
BUN: 8 mg/dL (ref 6–23)
CO2: 25 mEq/L (ref 19–32)
Calcium: 9.8 mg/dL (ref 8.4–10.5)
Chloride: 94 mEq/L — ABNORMAL LOW (ref 96–112)
Creatinine, Ser: 0.7 mg/dL (ref 0.4–1.2)
GFR: 96.17 mL/min (ref >60.00)
Glucose, Bld: 112 mg/dL — ABNORMAL HIGH (ref 70–99)
Potassium: 3.6 mEq/L (ref 3.5–5.1)
Sodium: 132 mEq/L — ABNORMAL LOW (ref 135–145)

## 2013-02-05 LAB — MICROALBUMIN / CREATININE URINE RATIO
Creatinine,U: 41.1 mg/dL
Microalb Creat Ratio: 5.8 mg/g (ref 0.0–30.0)
Microalb, Ur: 2.4 mg/dL — ABNORMAL HIGH (ref 0.0–1.9)

## 2013-02-05 LAB — LIPID PANEL
Cholesterol: 132 mg/dL (ref 0–200)
HDL: 43.1 mg/dL (ref >39.00)
LDL Cholesterol: 64 mg/dL (ref 0–99)
Total CHOL/HDL Ratio: 3
Triglycerides: 123 mg/dL (ref 0.0–149.0)
VLDL: 24.6 mg/dL (ref 0.0–40.0)

## 2013-02-05 LAB — HEPATIC FUNCTION PANEL
ALT: 21 U/L (ref 0–35)
AST: 17 U/L (ref 0–37)
Albumin: 4 g/dL (ref 3.5–5.2)
Alkaline Phosphatase: 107 U/L (ref 39–117)
Bilirubin, Direct: 0 mg/dL (ref 0.0–0.3)
Total Bilirubin: 0.6 mg/dL (ref 0.3–1.2)
Total Protein: 7.8 g/dL (ref 6.0–8.3)

## 2013-02-05 LAB — POCT URINALYSIS DIPSTICK
Bilirubin, UA: NEGATIVE
Blood, UA: NEGATIVE
Glucose, UA: NEGATIVE
Ketones, UA: NEGATIVE
Nitrite, UA: POSITIVE
Protein, UA: NEGATIVE
Spec Grav, UA: <=1.005
Urobilinogen, UA: 0.2
pH, UA: 6.5

## 2013-02-05 LAB — HEMOGLOBIN A1C: Hgb A1c MFr Bld: 7 % — ABNORMAL HIGH (ref 4.6–6.5)

## 2013-02-05 LAB — TSH: TSH: 1.58 u[IU]/mL (ref 0.35–5.50)

## 2013-02-05 MED ORDER — DIAZEPAM 5 MG PO TABS: ORAL_TABLET | ORAL | Status: DC

## 2013-02-05 NOTE — Assessment & Plan Note (Signed)
Check labs con't meds 

## 2013-02-05 NOTE — Assessment & Plan Note (Signed)
Stable con't meds 

## 2013-02-05 NOTE — Assessment & Plan Note (Signed)
con't meds  Check labs 

## 2013-02-05 NOTE — Patient Instructions (Signed)
Preventive Care for Adults, Female A healthy lifestyle and preventive care can promote health and wellness. Preventive health guidelines for women include the following key practices.  A routine yearly physical is a good way to check with your caregiver about your health and preventive screening. It is a chance to share any concerns and updates on your health, and to receive a thorough exam.  Visit your dentist for a routine exam and preventive care every 6 months. Brush your teeth twice a day and floss once a day. Good oral hygiene prevents tooth decay and gum disease.  The frequency of eye exams is based on your age, health, family medical history, use of contact lenses, and other factors. Follow your caregiver's recommendations for frequency of eye exams.  Eat a healthy diet. Foods like vegetables, fruits, whole grains, low-fat dairy products, and lean protein foods contain the nutrients you need without too many calories. Decrease your intake of foods high in solid fats, added sugars, and salt. Eat the right amount of calories for you.Get information about a proper diet from your caregiver, if necessary.  Regular physical exercise is one of the most important things you can do for your health. Most adults should get at least 150 minutes of moderate-intensity exercise (any activity that increases your heart rate and causes you to sweat) each week. In addition, most adults need muscle-strengthening exercises on 2 or more days a week.  Maintain a healthy weight. The body mass index (BMI) is a screening tool to identify possible weight problems. It provides an estimate of body fat based on height and weight. Your caregiver can help determine your BMI, and can help you achieve or maintain a healthy weight.For adults 20 years and older:  A BMI below 18.5 is considered underweight.  A BMI of 18.5 to 24.9 is normal.  A BMI of 25 to 29.9 is considered overweight.  A BMI of 30 and above is  considered obese.  Maintain normal blood lipids and cholesterol levels by exercising and minimizing your intake of saturated fat. Eat a balanced diet with plenty of fruit and vegetables. Blood tests for lipids and cholesterol should begin at age 20 and be repeated every 5 years. If your lipid or cholesterol levels are high, you are over 50, or you are at high risk for heart disease, you may need your cholesterol levels checked more frequently.Ongoing high lipid and cholesterol levels should be treated with medicines if diet and exercise are not effective.  If you smoke, find out from your caregiver how to quit. If you do not use tobacco, do not start.  If you are pregnant, do not drink alcohol. If you are breastfeeding, be very cautious about drinking alcohol. If you are not pregnant and choose to drink alcohol, do not exceed 1 drink per day. One drink is considered to be 12 ounces (355 mL) of beer, 5 ounces (148 mL) of wine, or 1.5 ounces (44 mL) of liquor.  Avoid use of street drugs. Do not share needles with anyone. Ask for help if you need support or instructions about stopping the use of drugs.  High blood pressure causes heart disease and increases the risk of stroke. Your blood pressure should be checked at least every 1 to 2 years. Ongoing high blood pressure should be treated with medicines if weight loss and exercise are not effective.  If you are 55 to 63 years old, ask your caregiver if you should take aspirin to prevent strokes.  Diabetes   screening involves taking a blood sample to check your fasting blood sugar level. This should be done once every 3 years, after age 45, if you are within normal weight and without risk factors for diabetes. Testing should be considered at a younger age or be carried out more frequently if you are overweight and have at least 1 risk factor for diabetes.  Breast cancer screening is essential preventive care for women. You should practice "breast  self-awareness." This means understanding the normal appearance and feel of your breasts and may include breast self-examination. Any changes detected, no matter how small, should be reported to a caregiver. Women in their 20s and 30s should have a clinical breast exam (CBE) by a caregiver as part of a regular health exam every 1 to 3 years. After age 40, women should have a CBE every year. Starting at age 40, women should consider having a mammography (breast X-ray test) every year. Women who have a family history of breast cancer should talk to their caregiver about genetic screening. Women at a high risk of breast cancer should talk to their caregivers about having magnetic resonance imaging (MRI) and a mammography every year.  The Pap test is a screening test for cervical cancer. A Pap test can show cell changes on the cervix that might become cervical cancer if left untreated. A Pap test is a procedure in which cells are obtained and examined from the lower end of the uterus (cervix).  Women should have a Pap test starting at age 21.  Between ages 21 and 29, Pap tests should be repeated every 2 years.  Beginning at age 30, you should have a Pap test every 3 years as long as the past 3 Pap tests have been normal.  Some women have medical problems that increase the chance of getting cervical cancer. Talk to your caregiver about these problems. It is especially important to talk to your caregiver if a new problem develops soon after your last Pap test. In these cases, your caregiver may recommend more frequent screening and Pap tests.  The above recommendations are the same for women who have or have not gotten the vaccine for human papillomavirus (HPV).  If you had a hysterectomy for a problem that was not cancer or a condition that could lead to cancer, then you no longer need Pap tests. Even if you no longer need a Pap test, a regular exam is a good idea to make sure no other problems are  starting.  If you are between ages 65 and 70, and you have had normal Pap tests going back 10 years, you no longer need Pap tests. Even if you no longer need a Pap test, a regular exam is a good idea to make sure no other problems are starting.  If you have had past treatment for cervical cancer or a condition that could lead to cancer, you need Pap tests and screening for cancer for at least 20 years after your treatment.  If Pap tests have been discontinued, risk factors (such as a new sexual partner) need to be reassessed to determine if screening should be resumed.  The HPV test is an additional test that may be used for cervical cancer screening. The HPV test looks for the virus that can cause the cell changes on the cervix. The cells collected during the Pap test can be tested for HPV. The HPV test could be used to screen women aged 30 years and older, and should   be used in women of any age who have unclear Pap test results. After the age of 30, women should have HPV testing at the same frequency as a Pap test.  Colorectal cancer can be detected and often prevented. Most routine colorectal cancer screening begins at the age of 50 and continues through age 75. However, your caregiver may recommend screening at an earlier age if you have risk factors for colon cancer. On a yearly basis, your caregiver may provide home test kits to check for hidden blood in the stool. Use of a small camera at the end of a tube, to directly examine the colon (sigmoidoscopy or colonoscopy), can detect the earliest forms of colorectal cancer. Talk to your caregiver about this at age 50, when routine screening begins. Direct examination of the colon should be repeated every 5 to 10 years through age 75, unless early forms of pre-cancerous polyps or small growths are found.  Hepatitis C blood testing is recommended for all people born from 1945 through 1965 and any individual with known risks for hepatitis C.  Practice  safe sex. Use condoms and avoid high-risk sexual practices to reduce the spread of sexually transmitted infections (STIs). STIs include gonorrhea, chlamydia, syphilis, trichomonas, herpes, HPV, and human immunodeficiency virus (HIV). Herpes, HIV, and HPV are viral illnesses that have no cure. They can result in disability, cancer, and death. Sexually active women aged 25 and younger should be checked for chlamydia. Older women with new or multiple partners should also be tested for chlamydia. Testing for other STIs is recommended if you are sexually active and at increased risk.  Osteoporosis is a disease in which the bones lose minerals and strength with aging. This can result in serious bone fractures. The risk of osteoporosis can be identified using a bone density scan. Women ages 65 and over and women at risk for fractures or osteoporosis should discuss screening with their caregivers. Ask your caregiver whether you should take a calcium supplement or vitamin D to reduce the rate of osteoporosis.  Menopause can be associated with physical symptoms and risks. Hormone replacement therapy is available to decrease symptoms and risks. You should talk to your caregiver about whether hormone replacement therapy is right for you.  Use sunscreen with sun protection factor (SPF) of 30 or more. Apply sunscreen liberally and repeatedly throughout the day. You should seek shade when your shadow is shorter than you. Protect yourself by wearing long sleeves, pants, a wide-brimmed hat, and sunglasses year round, whenever you are outdoors.  Once a month, do a whole body skin exam, using a mirror to look at the skin on your back. Notify your caregiver of new moles, moles that have irregular borders, moles that are larger than a pencil eraser, or moles that have changed in shape or color.  Stay current with required immunizations.  Influenza. You need a dose every fall (or winter). The composition of the flu vaccine  changes each year, so being vaccinated once is not enough.  Pneumococcal polysaccharide. You need 1 to 2 doses if you smoke cigarettes or if you have certain chronic medical conditions. You need 1 dose at age 65 (or older) if you have never been vaccinated.  Tetanus, diphtheria, pertussis (Tdap, Td). Get 1 dose of Tdap vaccine if you are younger than age 65, are over 65 and have contact with an infant, are a healthcare worker, are pregnant, or simply want to be protected from whooping cough. After that, you need a Td   booster dose every 10 years. Consult your caregiver if you have not had at least 3 tetanus and diphtheria-containing shots sometime in your life or have a deep or dirty wound.  HPV. You need this vaccine if you are a woman age 26 or younger. The vaccine is given in 3 doses over 6 months.  Measles, mumps, rubella (MMR). You need at least 1 dose of MMR if you were born in 1957 or later. You may also need a second dose.  Meningococcal. If you are age 19 to 21 and a first-year college student living in a residence hall, or have one of several medical conditions, you need to get vaccinated against meningococcal disease. You may also need additional booster doses.  Zoster (shingles). If you are age 60 or older, you should get this vaccine.  Varicella (chickenpox). If you have never had chickenpox or you were vaccinated but received only 1 dose, talk to your caregiver to find out if you need this vaccine.  Hepatitis A. You need this vaccine if you have a specific risk factor for hepatitis A virus infection or you simply wish to be protected from this disease. The vaccine is usually given as 2 doses, 6 to 18 months apart.  Hepatitis B. You need this vaccine if you have a specific risk factor for hepatitis B virus infection or you simply wish to be protected from this disease. The vaccine is given in 3 doses, usually over 6 months. Preventive Services / Frequency Ages 19 to 39  Blood  pressure check.** / Every 1 to 2 years.  Lipid and cholesterol check.** / Every 5 years beginning at age 20.  Clinical breast exam.** / Every 3 years for women in their 20s and 30s.  Pap test.** / Every 2 years from ages 21 through 29. Every 3 years starting at age 30 through age 65 or 70 with a history of 3 consecutive normal Pap tests.  HPV screening.** / Every 3 years from ages 30 through ages 65 to 70 with a history of 3 consecutive normal Pap tests.  Hepatitis C blood test.** / For any individual with known risks for hepatitis C.  Skin self-exam. / Monthly.  Influenza immunization.** / Every year.  Pneumococcal polysaccharide immunization.** / 1 to 2 doses if you smoke cigarettes or if you have certain chronic medical conditions.  Tetanus, diphtheria, pertussis (Tdap, Td) immunization. / A one-time dose of Tdap vaccine. After that, you need a Td booster dose every 10 years.  HPV immunization. / 3 doses over 6 months, if you are 26 and younger.  Measles, mumps, rubella (MMR) immunization. / You need at least 1 dose of MMR if you were born in 1957 or later. You may also need a second dose.  Meningococcal immunization. / 1 dose if you are age 19 to 21 and a first-year college student living in a residence hall, or have one of several medical conditions, you need to get vaccinated against meningococcal disease. You may also need additional booster doses.  Varicella immunization.** / Consult your caregiver.  Hepatitis A immunization.** / Consult your caregiver. 2 doses, 6 to 18 months apart.  Hepatitis B immunization.** / Consult your caregiver. 3 doses usually over 6 months. Ages 40 to 64  Blood pressure check.** / Every 1 to 2 years.  Lipid and cholesterol check.** / Every 5 years beginning at age 20.  Clinical breast exam.** / Every year after age 40.  Mammogram.** / Every year beginning at age 40   and continuing for as long as you are in good health. Consult with your  caregiver.  Pap test.** / Every 3 years starting at age 30 through age 65 or 70 with a history of 3 consecutive normal Pap tests.  HPV screening.** / Every 3 years from ages 30 through ages 65 to 70 with a history of 3 consecutive normal Pap tests.  Fecal occult blood test (FOBT) of stool. / Every year beginning at age 50 and continuing until age 75. You may not need to do this test if you get a colonoscopy every 10 years.  Flexible sigmoidoscopy or colonoscopy.** / Every 5 years for a flexible sigmoidoscopy or every 10 years for a colonoscopy beginning at age 50 and continuing until age 75.  Hepatitis C blood test.** / For all people born from 1945 through 1965 and any individual with known risks for hepatitis C.  Skin self-exam. / Monthly.  Influenza immunization.** / Every year.  Pneumococcal polysaccharide immunization.** / 1 to 2 doses if you smoke cigarettes or if you have certain chronic medical conditions.  Tetanus, diphtheria, pertussis (Tdap, Td) immunization.** / A one-time dose of Tdap vaccine. After that, you need a Td booster dose every 10 years.  Measles, mumps, rubella (MMR) immunization. / You need at least 1 dose of MMR if you were born in 1957 or later. You may also need a second dose.  Varicella immunization.** / Consult your caregiver.  Meningococcal immunization.** / Consult your caregiver.  Hepatitis A immunization.** / Consult your caregiver. 2 doses, 6 to 18 months apart.  Hepatitis B immunization.** / Consult your caregiver. 3 doses, usually over 6 months. Ages 65 and over  Blood pressure check.** / Every 1 to 2 years.  Lipid and cholesterol check.** / Every 5 years beginning at age 20.  Clinical breast exam.** / Every year after age 40.  Mammogram.** / Every year beginning at age 40 and continuing for as long as you are in good health. Consult with your caregiver.  Pap test.** / Every 3 years starting at age 30 through age 65 or 70 with a 3  consecutive normal Pap tests. Testing can be stopped between 65 and 70 with 3 consecutive normal Pap tests and no abnormal Pap or HPV tests in the past 10 years.  HPV screening.** / Every 3 years from ages 30 through ages 65 or 70 with a history of 3 consecutive normal Pap tests. Testing can be stopped between 65 and 70 with 3 consecutive normal Pap tests and no abnormal Pap or HPV tests in the past 10 years.  Fecal occult blood test (FOBT) of stool. / Every year beginning at age 50 and continuing until age 75. You may not need to do this test if you get a colonoscopy every 10 years.  Flexible sigmoidoscopy or colonoscopy.** / Every 5 years for a flexible sigmoidoscopy or every 10 years for a colonoscopy beginning at age 50 and continuing until age 75.  Hepatitis C blood test.** / For all people born from 1945 through 1965 and any individual with known risks for hepatitis C.  Osteoporosis screening.** / A one-time screening for women ages 65 and over and women at risk for fractures or osteoporosis.  Skin self-exam. / Monthly.  Influenza immunization.** / Every year.  Pneumococcal polysaccharide immunization.** / 1 dose at age 65 (or older) if you have never been vaccinated.  Tetanus, diphtheria, pertussis (Tdap, Td) immunization. / A one-time dose of Tdap vaccine if you are over   65 and have contact with an infant, are a healthcare worker, or simply want to be protected from whooping cough. After that, you need a Td booster dose every 10 years.  Varicella immunization.** / Consult your caregiver.  Meningococcal immunization.** / Consult your caregiver.  Hepatitis A immunization.** / Consult your caregiver. 2 doses, 6 to 18 months apart.  Hepatitis B immunization.** / Check with your caregiver. 3 doses, usually over 6 months. ** Family history and personal history of risk and conditions may change your caregiver's recommendations. Document Released: 08/08/2001 Document Revised: 09/04/2011  Document Reviewed: 11/07/2010 [employer] Patient Information 2014 [employer]  

## 2013-02-05 NOTE — Progress Notes (Signed)
Subjective:     Alyssa Ford is a 62 y.o. female and is here for a comprehensive physical exam. The patient reports problems - back pain-- no radiation.  Pt states there was no injury.  She was having pain in L leg but has seen ortho and is in PT and leg pain is better but back pain is keeping her awake.    Patella reflex gone on the L, per pt.     History   Social History  . Marital Status: Married    Spouse Name: N/A    Number of Children: N/A  . Years of Education: N/A   Occupational History  . Not on file.   Social History Main Topics  . Smoking status: Current Every Day Smoker -- 0.80 packs/day    Types: Cigarettes  . Smokeless tobacco: Never Used     Comment: e cig  . Alcohol Use: Yes     Comment: occ  . Drug Use: No  . Sexual Activity: Yes    Partners: Male   Other Topics Concern  . Not on file   Social History Narrative   Exercise--  walk   Health Maintenance  Topic Date Due  . Urine Microalbumin  08/24/2012  . Hemoglobin A1c  11/19/2012  . Foot Exam  12/24/2012  . Influenza Vaccine  02/24/2013  . Ophthalmology Exam  01/09/2014  . Mammogram  05/08/2014  . Pap Smear  12/25/2014  . Colonoscopy  02/24/2020  . Tetanus/tdap  02/24/2020  . Pneumococcal Polysaccharide Vaccine  Completed  . Zostavax  Completed    The following portions of the patient's history were reviewed and updated as appropriate:  She  has a past medical history of Diabetes mellitus; Hyperlipidemia; Hypertension; GERD (gastroesophageal reflux disease); Thyroid nodule; Cataract; Glaucoma; Anemia; Diverticulosis of colon (without mention of hemorrhage) (2003, 2008); Hiatal hernia (2003); Arthritis; and Chronic headaches. She  does not have any pertinent problems on file. She  has past surgical history that includes Cholecystectomy; Carpal tunnel release (2006); Carpal tunnel release (12/24/2007); and Cervical fusion (3/08). Her family history includes Breast cancer (age of onset: 2) in her  mother; Dementia in her mother; Diabetes in her brother and mother; Diabetes (age of onset: 81) in her father; Hyperlipidemia in her mother; Hypertension in her brother, mother, and sister; Kidney failure (age of onset: 36) in her mother; Parkinsonism in her father. There is no history of Colon cancer. She  reports that she has been smoking Cigarettes.  She has been smoking about 0.80 packs per day. She has never used smokeless tobacco. She reports that  drinks alcohol. She reports that she does not use illicit drugs. She has a current medication list which includes the following prescription(s): amitriptyline, atorvastatin, biotin, bupropion, diazepam, glimepiride, hydrochlorothiazide, lumigan, metformin, naproxen sodium, NON FORMULARY, and omeprazole. Current Outpatient Prescriptions on File Prior to Visit  Medication Sig Dispense Refill  . amitriptyline (ELAVIL) 25 MG tablet TAKE TWO TABLETS BY MOUTH ONCE DAILY  60 tablet  0  . atorvastatin (LIPITOR) 80 MG tablet 1 tab by mouth daily-- office visit due now  30 tablet  0  . BIOTIN PO Take 1 tablet by mouth daily.       Marland Kitchen buPROPion (WELLBUTRIN XL) 150 MG 24 hr tablet Take 2 tablets (300 mg total) by mouth every morning.  60 tablet  0  . diazepam (VALIUM) 5 MG tablet TAKE ONE TABLET BY MOUTH AT BEDTIME AS NEEDED FOR ANXIETY  30 tablet  0  .  glimepiride (AMARYL) 2 MG tablet Take 1 tablet (2 mg total) by mouth daily before breakfast.  30 tablet  2  . hydrochlorothiazide (HYDRODIURIL) 25 MG tablet TAKE ONE TABLET BY MOUTH EVERY DAY  30 tablet  5  . LUMIGAN 0.01 % SOLN Place 1 drop into both eyes at bedtime.       . metFORMIN (GLUCOPHAGE) 850 MG tablet TAKE TWO TABLETS BY MOUTH IN THE MORNING AND THEN ONE TABLET BY MOUTH IN THE EVENING  90 tablet  1  . Naproxen Sodium (ALEVE PO) Take 2 tablets by mouth daily.      . NON FORMULARY Apply 1 drop topically 2 (two) times a week. Liquid hair drops      . omeprazole (PRILOSEC) 20 MG capsule Take 20 mg by mouth  daily.         No current facility-administered medications on file prior to visit.   She is allergic to codeine..  Review of Systems Review of Systems  Constitutional: Negative for activity change, appetite change and fatigue.  HENT: Negative for hearing loss, congestion, tinnitus and ear discharge.  dentist q65m Eyes: Negative for visual disturbance (see optho q38m -- vision corrected to 20/20 with glasses).  Respiratory: Negative for cough, chest tightness and shortness of breath.   Cardiovascular: Negative for chest pain, palpitations and leg swelling.  Gastrointestinal: Negative for abdominal pain, diarrhea, constipation and abdominal distention.  Genitourinary: Negative for urgency, frequency, decreased urine volume and difficulty urinating.  Musculoskeletal:  + back pain Skin: Negative for color change, pallor and rash.  Neurological: Negative for dizziness, light-headedness, numbness and headaches.  Hematological: Negative for adenopathy. Does not bruise/bleed easily.  Psychiatric/Behavioral: Negative for suicidal ideas, confusion, sleep disturbance, self-injury, dysphoric mood, decreased concentration and agitation.       Objective:    BP 140/70  Pulse 84  Temp(Src) 99 F (37.2 C) (Oral)  Ht 5' 6.5" (1.689 m)  Wt 161 lb 6.4 oz (73.211 kg)  BMI 25.66 kg/m2  SpO2 98% General appearance: alert, cooperative, appears stated age and no distress Head: Normocephalic, without obvious abnormality, atraumatic Eyes: negative findings: lids and lashes normal and pupils equal, round, reactive to light and accomodation Ears: normal TM's and external ear canals both ears Nose: Nares normal. Septum midline. Mucosa normal. No drainage or sinus tenderness. Throat: lips, mucosa, and tongue normal; teeth and gums normal Neck: no adenopathy, no carotid bruit, no JVD, supple, symmetrical, trachea midline and thyroid not enlarged, symmetric, no tenderness/mass/nodules Back: symmetric, no  curvature. ROM normal. No CVA tenderness. Lungs: clear to auscultation bilaterally Breasts: normal appearance, no masses or tenderness Heart: regular rate and rhythm, S1, S2 normal, no murmur, click, rub or gallop Abdomen: soft, non-tender; bowel sounds normal; no masses,  no organomegaly Pelvic: not indicated; post-menopausal, no abnormal Pap smears in past Extremities: extremities normal, atraumatic, no cyanosis or edema Pulses: 2+ and symmetric Skin: Skin color, texture, turgor normal. No rashes or lesions Lymph nodes: Cervical, supraclavicular, and axillary nodes normal. Neurologic: Alert and oriented X 3, normal strength and tone. Normal symmetric reflexes. Normal coordination and gait Psych-no anxiety, no depression     Sensory exam of the foot is normal, tested with the monofilament. Good pulses, no lesions or ulcers, good peripheral pulses.  Assessment:    Healthy female exam.      Plan:  ghm utd Check labs   See After Visit Summary for Counseling Recommendations

## 2013-02-05 NOTE — Addendum Note (Signed)
Addended by: Silvio Pate D on: 02/05/2013 04:09 PM   Modules accepted: Orders

## 2013-02-08 LAB — URINE CULTURE: Colony Count: >=100000

## 2013-02-11 ENCOUNTER — Ambulatory Visit (HOSPITAL_BASED_OUTPATIENT_CLINIC_OR_DEPARTMENT_OTHER)
Admission: RE | Admit: 2013-02-11 | Discharge: 2013-02-11 | Disposition: A | Discharge status: Home / Self Care | Payer: BC Managed Care – PPO | Source: Ambulatory Visit | Attending: Family Medicine | Admitting: Family Medicine

## 2013-02-11 DIAGNOSIS — M549 Dorsalgia, unspecified: Secondary | ICD-10-CM

## 2013-02-11 DIAGNOSIS — M5126 Other intervertebral disc displacement, lumbar region: Secondary | ICD-10-CM | POA: Insufficient documentation

## 2013-02-11 MED ORDER — CIPROFLOXACIN HCL 500 MG PO TABS: 500.0000 mg | ORAL_TABLET | Freq: Two times a day (BID) | ORAL | Status: DC

## 2013-02-13 ENCOUNTER — Telehealth: Payer: Self-pay | Admitting: Family Medicine

## 2013-02-13 NOTE — Telephone Encounter (Signed)
Spoke with patient and she voiced understanding, the back pain is better since starting the Abx.      KP

## 2013-02-13 NOTE — Telephone Encounter (Signed)
Patient is requesting MRI results.

## 2013-02-13 NOTE — Telephone Encounter (Signed)
Mild degeneration with min stenosis---refer to ortho if pain persists

## 2013-02-14 ENCOUNTER — Telehealth: Payer: Self-pay

## 2013-02-14 DIAGNOSIS — M545 Low back pain, unspecified: Secondary | ICD-10-CM

## 2013-02-14 NOTE — Telephone Encounter (Signed)
Message left on triage voicemail: Patient was speaking with Selena Batten yesterday, patient indicated yesterday that her back pain was better. Since that conversation, patients pain has changed and is now worse. Patient would like to consider seeing Ortho. Patient indicated in the past she seen Dr.Steven Valentina Gu  Ortho referral placed, will send to Selena Batten and Dr.Lowne as a [employer]

## 2013-02-14 NOTE — Telephone Encounter (Signed)
Great.  Thanks

## 2013-03-10 ENCOUNTER — Other Ambulatory Visit: Payer: Self-pay | Admitting: Family Medicine

## 2013-03-13 ENCOUNTER — Encounter: Payer: Self-pay | Admitting: Family Medicine

## 2013-03-14 ENCOUNTER — Other Ambulatory Visit: Payer: Self-pay | Admitting: Family Medicine

## 2013-04-24 ENCOUNTER — Telehealth: Payer: Self-pay | Admitting: Family Medicine

## 2013-04-24 NOTE — Telephone Encounter (Signed)
Patient states that she has been unable to sleep for 2 nights due to the back and leg pain she has been having.She had an  MRI on 02/11/13.  Patient wants to know what she should do about pain.

## 2013-04-24 NOTE — Telephone Encounter (Signed)
She should f/u ortho.

## 2013-04-24 NOTE — Telephone Encounter (Signed)
MSG left to call the office      KP 

## 2013-04-24 NOTE — Telephone Encounter (Signed)
Please advise      KP 

## 2013-04-24 NOTE — Telephone Encounter (Signed)
Patient states that she feels like her going Ortho is not helping her. Ortho told her that back looks fine but she stills has pain. SW, CMA

## 2013-04-24 NOTE — Telephone Encounter (Signed)
Spoke with pt to f/u with ortho

## 2013-04-25 NOTE — Telephone Encounter (Signed)
Patient denied med's she said she seen Delbert Harness last night at the walk [facility] and she is doing much better.     KP

## 2013-04-25 NOTE — Telephone Encounter (Signed)
We can refer to Dr Ethelene Hal at [facility] ortho --- he just does pain management.   She is allergic to codeine--- has she tried ultram?   Ultram 50 mg #30  1 po q6h prn  Skelaxin 800 mg 1 po tid prn #30

## 2013-05-09 ENCOUNTER — Ambulatory Visit
Admission: RE | Admit: 2013-05-09 | Discharge: 2013-05-09 | Disposition: A | Discharge status: Home / Self Care | Payer: BC Managed Care – PPO | Source: Ambulatory Visit | Attending: Family Medicine | Admitting: Family Medicine

## 2013-05-09 DIAGNOSIS — E2839 Other primary ovarian failure: Secondary | ICD-10-CM

## 2013-05-09 DIAGNOSIS — Z1239 Encounter for other screening for malignant neoplasm of breast: Secondary | ICD-10-CM

## 2013-05-10 ENCOUNTER — Other Ambulatory Visit: Payer: Self-pay | Admitting: Family Medicine

## 2013-05-20 ENCOUNTER — Other Ambulatory Visit: Payer: Self-pay | Admitting: Family Medicine

## 2013-06-09 ENCOUNTER — Other Ambulatory Visit: Payer: Self-pay | Admitting: Family Medicine

## 2013-09-07 ENCOUNTER — Other Ambulatory Visit: Payer: Self-pay | Admitting: Family Medicine

## 2013-09-19 ENCOUNTER — Other Ambulatory Visit: Payer: Self-pay | Admitting: Family Medicine

## 2013-10-15 ENCOUNTER — Other Ambulatory Visit: Payer: Self-pay | Admitting: Family Medicine

## 2013-10-24 ENCOUNTER — Ambulatory Visit (INDEPENDENT_AMBULATORY_CARE_PROVIDER_SITE_OTHER): Payer: No Typology Code available for payment source | Admitting: Family Medicine

## 2013-10-24 ENCOUNTER — Encounter: Payer: Self-pay | Admitting: Family Medicine

## 2013-10-24 VITALS — BP 120/70 | HR 92 | Temp 98.2°F | Wt 165.4 lb

## 2013-10-24 DIAGNOSIS — E785 Hyperlipidemia, unspecified: Secondary | ICD-10-CM

## 2013-10-24 DIAGNOSIS — I10 Essential (primary) hypertension: Secondary | ICD-10-CM

## 2013-10-24 DIAGNOSIS — E1159 Type 2 diabetes mellitus with other circulatory complications: Secondary | ICD-10-CM

## 2013-10-24 LAB — BASIC METABOLIC PANEL
BUN: 12 mg/dL (ref 6–23)
CO2: 27 mEq/L (ref 19–32)
Calcium: 10.1 mg/dL (ref 8.4–10.5)
Chloride: 94 mEq/L — ABNORMAL LOW (ref 96–112)
Creatinine, Ser: 0.6 mg/dL (ref 0.4–1.2)
GFR: 111.38 mL/min (ref >60.00)
Glucose, Bld: 103 mg/dL — ABNORMAL HIGH (ref 70–99)
Potassium: 3.9 mEq/L (ref 3.5–5.1)
Sodium: 132 mEq/L — ABNORMAL LOW (ref 135–145)

## 2013-10-24 LAB — LIPID PANEL
Cholesterol: 144 mg/dL (ref 0–200)
HDL: 42.3 mg/dL (ref >39.00)
LDL Cholesterol: 71 mg/dL (ref 0–99)
Total CHOL/HDL Ratio: 3
Triglycerides: 153 mg/dL — ABNORMAL HIGH (ref 0.0–149.0)
VLDL: 30.6 mg/dL (ref 0.0–40.0)

## 2013-10-24 LAB — HEPATIC FUNCTION PANEL
ALT: 24 U/L (ref 0–35)
AST: 23 U/L (ref 0–37)
Albumin: 4.3 g/dL (ref 3.5–5.2)
Alkaline Phosphatase: 94 U/L (ref 39–117)
Bilirubin, Direct: 0 mg/dL (ref 0.0–0.3)
Total Bilirubin: 0.3 mg/dL (ref 0.3–1.2)
Total Protein: 7.7 g/dL (ref 6.0–8.3)

## 2013-10-24 LAB — POCT URINALYSIS DIPSTICK
Bilirubin, UA: NEGATIVE
Blood, UA: NEGATIVE
Glucose, UA: NEGATIVE
Ketones, UA: NEGATIVE
Leukocytes, UA: NEGATIVE
Nitrite, UA: NEGATIVE
Protein, UA: NEGATIVE
Spec Grav, UA: <=1.005
Urobilinogen, UA: 0.2
pH, UA: 6.5

## 2013-10-24 LAB — MICROALBUMIN / CREATININE URINE RATIO
Creatinine,U: 45 mg/dL
Microalb Creat Ratio: 3.1 mg/g (ref 0.0–30.0)
Microalb, Ur: 1.4 mg/dL (ref 0.0–1.9)

## 2013-10-24 LAB — HEMOGLOBIN A1C: Hgb A1c MFr Bld: 6.8 % — ABNORMAL HIGH (ref 4.6–6.5)

## 2013-10-24 NOTE — Progress Notes (Signed)
Pre visit review using our clinic review tool, if applicable. No additional management support is needed unless otherwise documented below in the visit note. 

## 2013-10-24 NOTE — Patient Instructions (Signed)

## 2013-10-24 NOTE — Progress Notes (Signed)
Subjective:    Patient ID: Alyssa Ford, female    DOB: May 09, 1951, 63 y.o.   MRN: [id number]  Diabetes Pertinent negatives for hypoglycemia include no dizziness or headaches. Pertinent negatives for diabetes include no chest pain, no fatigue, no polydipsia, no polyphagia and no polyuria.   HPI HYPERTENSION  Blood pressure range-not checking  Chest pain- no      Dyspnea- no Lightheadedness- no   Edema- no Other side effects - no   Medication compliance: good Low salt diet- yes  DIABETES  Blood Sugar ranges-120-200  Polyuria- no New Visual problems- no Hypoglycemic symptoms- no Other side effects-no Medication compliance - good Last eye exam- 08/2013 Foot exam- today  HYPERLIPIDEMIA  Medication compliance- good RUQ pain- no  Muscle aches- no Other side effects- no  Review of Systems  Constitutional: Negative for diaphoresis, appetite change, fatigue and unexpected weight change.  Eyes: Negative for pain, redness and visual disturbance.  Respiratory: Negative for cough, chest tightness, shortness of breath and wheezing.   Cardiovascular: Negative for chest pain, palpitations and leg swelling.  Endocrine: Negative for cold intolerance, heat intolerance, polydipsia, polyphagia and polyuria.  Genitourinary: Negative for dysuria, frequency and difficulty urinating.  Neurological: Negative for dizziness, light-headedness, numbness and headaches.   Past Medical History  Diagnosis Date  . Diabetes mellitus   . Hyperlipidemia   . Hypertension   . GERD (gastroesophageal reflux disease)   . Thyroid nodule   . Cataract   . Glaucoma   . Anemia   . Diverticulosis of colon (without mention of hemorrhage) 2003, 2008    Colonoscopy  . Hiatal hernia 2003    EGD   . Arthritis   . Chronic headaches    History   Social History  . Marital Status: Married    Spouse Name: N/A    Number of Children: N/A  . Years of Education: N/A   Occupational History  . retired     Social History Main Topics  . Smoking status: Current Every Day Smoker -- 0.50 packs/day    Types: Cigarettes  . Smokeless tobacco: Never Used     Comment: e cig  . Alcohol Use: Yes     Comment: occ  . Drug Use: No  . Sexual Activity: Yes    Partners: Male   Other Topics Concern  . Not on file   Social History Narrative   Exercise--  walk   Current Outpatient Prescriptions  Medication Sig Dispense Refill  . amitriptyline (ELAVIL) 25 MG tablet TAKE TWO TABLETS BY MOUTH ONCE DAILY  60 tablet  2  . atorvastatin (LIPITOR) 80 MG tablet Take 1 tablet (80 mg total) by mouth daily.  30 tablet  5  . BIOTIN PO Take 1 tablet by mouth daily.       Marland Kitchen buPROPion (WELLBUTRIN XL) 150 MG 24 hr tablet TAKE TWO TABLETS BY MOUTH EVERY MORNING  60 tablet  2  . diazepam (VALIUM) 5 MG tablet TAKE ONE TABLET BY MOUTH AT BEDTIME AS NEEDED FOR ANXIETY  30 tablet  0  . glimepiride (AMARYL) 2 MG tablet TAKE ONE TABLET BY MOUTH ONCE DAILY BEFORE BREAKFAST  30 tablet  5  . hydrochlorothiazide (HYDRODIURIL) 25 MG tablet 1 tab by mouth daily---office visit due now  30 tablet  0  . latanoprost (XALATAN) 0.005 % ophthalmic solution       . LUMIGAN 0.01 % SOLN Place 1 drop into both eyes at bedtime.       Marland Kitchen  metFORMIN (GLUCOPHAGE) 850 MG tablet TAKE TWO TABLETS BY MOUTH IN THE MORNING AND ONE IN THE EVENING  90 tablet  5  . Naproxen Sodium (ALEVE PO) Take 2 tablets by mouth daily.      . NON FORMULARY Apply 1 drop topically 2 (two) times a week. Liquid hair drops      . omeprazole (PRILOSEC) 20 MG capsule Take 20 mg by mouth daily.         No current facility-administered medications for this visit.       Objective:   Physical Exam  Constitutional: She is oriented to person, place, and time. She appears well-developed and well-nourished.  HENT:  Head: Normocephalic and atraumatic.  Eyes: Conjunctivae and EOM are normal.  Neck: Normal range of motion. Neck supple. No JVD present. Carotid bruit is not  present. No thyromegaly present.  Cardiovascular: Normal rate, regular rhythm and normal heart sounds.   No murmur heard. Pulmonary/Chest: Effort normal and breath sounds normal. No respiratory distress. She has no wheezes. She has no rales. She exhibits no tenderness.  Musculoskeletal: She exhibits no edema.  Neurological: She is alert and oriented to person, place, and time.  Psychiatric: She has a normal mood and affect.   Filed Vitals:   10/24/13 0821  BP: 120/70  Pulse: 92  Temp: 98.2 F (36.8 C)  TempSrc: Oral  Weight: 165 lb 6.4 oz (75.025 kg)  SpO2: 96%           Assessment & Plan:  1. Other and unspecified hyperlipidemia Check labs, con't meds - Basic metabolic panel - Lipid panel - Hepatic function panel  2. Type II or unspecified type diabetes mellitus with peripheral circulatory disorders, uncontrolled(250.72) con't meds, check labs - Hemoglobin A1c; Future - Microalbumin / creatinine urine ratio - POCT urinalysis dipstick  3. HTN (hypertension) Stable, con't meds

## 2013-10-27 ENCOUNTER — Telehealth: Payer: Self-pay | Admitting: Family Medicine

## 2013-10-27 NOTE — Telephone Encounter (Signed)
Relevant patient education mailed to patient.  

## 2013-10-31 ENCOUNTER — Ambulatory Visit: Payer: BC Managed Care – PPO | Admitting: Family Medicine

## 2013-10-31 ENCOUNTER — Ambulatory Visit (INDEPENDENT_AMBULATORY_CARE_PROVIDER_SITE_OTHER)
Admission: RE | Admit: 2013-10-31 | Discharge: 2013-10-31 | Disposition: A | Discharge status: Home / Self Care | Payer: No Typology Code available for payment source | Source: Ambulatory Visit | Attending: Family Medicine | Admitting: Family Medicine

## 2013-10-31 ENCOUNTER — Encounter: Payer: Self-pay | Admitting: Family Medicine

## 2013-10-31 ENCOUNTER — Ambulatory Visit (INDEPENDENT_AMBULATORY_CARE_PROVIDER_SITE_OTHER): Payer: No Typology Code available for payment source | Admitting: Family Medicine

## 2013-10-31 ENCOUNTER — Other Ambulatory Visit (HOSPITAL_COMMUNITY)
Admission: RE | Admit: 2013-10-31 | Discharge: 2013-10-31 | Disposition: A | Discharge status: Home / Self Care | Payer: No Typology Code available for payment source | Source: Ambulatory Visit | Attending: Family Medicine | Admitting: Family Medicine

## 2013-10-31 VITALS — BP 114/70 | HR 92 | Temp 98.1°F | Ht 66.0 in | Wt 165.6 lb

## 2013-10-31 DIAGNOSIS — M79671 Pain in right foot: Secondary | ICD-10-CM

## 2013-10-31 DIAGNOSIS — Z01419 Encounter for gynecological examination (general) (routine) without abnormal findings: Secondary | ICD-10-CM

## 2013-10-31 DIAGNOSIS — M79609 Pain in unspecified limb: Secondary | ICD-10-CM

## 2013-10-31 DIAGNOSIS — Z1151 Encounter for screening for human papillomavirus (HPV): Secondary | ICD-10-CM | POA: Insufficient documentation

## 2013-10-31 DIAGNOSIS — Z124 Encounter for screening for malignant neoplasm of cervix: Secondary | ICD-10-CM

## 2013-10-31 NOTE — Progress Notes (Signed)
Pre visit review using our clinic review tool, if applicable. No additional management support is needed unless otherwise documented below in the visit note. 

## 2013-10-31 NOTE — Patient Instructions (Signed)

## 2013-10-31 NOTE — Addendum Note (Signed)
Addended by: Ewing Schlein on: 10/31/2013 04:42 PM   Modules accepted: Orders

## 2013-10-31 NOTE — Progress Notes (Signed)
Subjective:     Alyssa Ford is a 63 y.o. woman who comes in today for a  pap smear only. Her most recent annual exam was on 12/2012. Her most recent Pap smear was on 2013 and showed no abnormalities. Previous abnormal Pap smears: no. Contraception: none  The following portions of the patient's history were reviewed and updated as appropriate:  She  has a past medical history of Diabetes mellitus; Hyperlipidemia; Hypertension; GERD (gastroesophageal reflux disease); Thyroid nodule; Cataract; Glaucoma; Anemia; Diverticulosis of colon (without mention of hemorrhage) (2003, 2008); Hiatal hernia (2003); Arthritis; and Chronic headaches. She  does not have any pertinent problems on file. She  has past surgical history that includes Cholecystectomy; Carpal tunnel release (2006); Carpal tunnel release (12/24/2007); and Cervical fusion (3/08). Her family history includes Breast cancer (age of onset: 83) in her mother; Dementia in her mother; Diabetes in her brother and mother; Diabetes (age of onset: 47) in her father; Hyperlipidemia in her mother; Hypertension in her brother, mother, and sister; Kidney failure (age of onset: 96) in her mother; Parkinsonism in her father. There is no history of Colon cancer. She  reports that she has been smoking Cigarettes.  She has been smoking about 0.50 packs per day. She has never used smokeless tobacco. She reports that she drinks alcohol. She reports that she does not use illicit drugs. She has a current medication list which includes the following prescription(s): amitriptyline, atorvastatin, biotin, bupropion, diazepam, glimepiride, hydrochlorothiazide, latanoprost, lumigan, metformin, naproxen sodium, NON FORMULARY, and omeprazole. Current Outpatient Prescriptions on File Prior to Visit  Medication Sig Dispense Refill  . amitriptyline (ELAVIL) 25 MG tablet TAKE TWO TABLETS BY MOUTH ONCE DAILY  60 tablet  2  . atorvastatin (LIPITOR) 80 MG tablet Take 1 tablet (80  mg total) by mouth daily.  30 tablet  5  . BIOTIN PO Take 1 tablet by mouth daily.       Marland Kitchen buPROPion (WELLBUTRIN XL) 150 MG 24 hr tablet TAKE TWO TABLETS BY MOUTH EVERY MORNING  60 tablet  2  . diazepam (VALIUM) 5 MG tablet TAKE ONE TABLET BY MOUTH AT BEDTIME AS NEEDED FOR ANXIETY  30 tablet  0  . glimepiride (AMARYL) 2 MG tablet TAKE ONE TABLET BY MOUTH ONCE DAILY BEFORE BREAKFAST  30 tablet  5  . hydrochlorothiazide (HYDRODIURIL) 25 MG tablet 1 tab by mouth daily---office visit due now  30 tablet  0  . latanoprost (XALATAN) 0.005 % ophthalmic solution       . LUMIGAN 0.01 % SOLN Place 1 drop into both eyes at bedtime.       . metFORMIN (GLUCOPHAGE) 850 MG tablet TAKE TWO TABLETS BY MOUTH IN THE MORNING AND ONE IN THE EVENING  90 tablet  5  . Naproxen Sodium (ALEVE PO) Take 2 tablets by mouth daily.      . NON FORMULARY Apply 1 drop topically 2 (two) times a week. Liquid hair drops      . omeprazole (PRILOSEC) 20 MG capsule Take 20 mg by mouth daily.         No current facility-administered medications on file prior to visit.   She is allergic to codeine..  Review of Systems Pertinent items are noted in HPI.   Objective:    BP 114/70  Pulse 92  Temp(Src) 98.1 F (36.7 C) (Oral)  Ht 5\' 6"  (1.676 m)  Wt 165 lb 9.6 oz (75.116 kg)  BMI 26.74 kg/m2  SpO2 96% Pelvic Exam: cervix normal  in appearance, external genitalia normal, no adnexal masses or tenderness, no bladder tenderness, no cervical motion tenderness, urethra without abnormality or discharge, uterus normal size, shape, and consistency and vagina normal without discharge. Pap smear obtained.   Assessment:    Screening pap smear.   Plan:    Follow up in 3 months for cpe, or as indicated by Pap results.

## 2013-11-05 ENCOUNTER — Other Ambulatory Visit: Payer: Self-pay | Admitting: Family Medicine

## 2013-11-18 ENCOUNTER — Other Ambulatory Visit: Payer: Self-pay | Admitting: Family Medicine

## 2013-11-19 ENCOUNTER — Other Ambulatory Visit: Payer: Self-pay | Admitting: Family Medicine

## 2013-11-20 NOTE — Telephone Encounter (Signed)
Last seen 10/31/13 and filled  02/05/13 #30. Please advise      KP

## 2013-12-01 ENCOUNTER — Other Ambulatory Visit: Payer: Self-pay | Admitting: Family Medicine

## 2013-12-21 ENCOUNTER — Other Ambulatory Visit: Payer: Self-pay | Admitting: Family Medicine

## 2014-01-13 ENCOUNTER — Encounter: Payer: Self-pay | Admitting: Family Medicine

## 2014-01-15 ENCOUNTER — Telehealth: Payer: Self-pay

## 2014-01-15 DIAGNOSIS — E119 Type 2 diabetes mellitus without complications: Secondary | ICD-10-CM

## 2014-01-15 DIAGNOSIS — Z0100 Encounter for examination of eyes and vision without abnormal findings: Principal | ICD-10-CM

## 2014-01-15 DIAGNOSIS — Z1283 Encounter for screening for malignant neoplasm of skin: Secondary | ICD-10-CM

## 2014-01-15 NOTE — Telephone Encounter (Signed)
Message received via MyChart:  ----- Message -----  From: Roderick Pee Krach  Sent: 01/13/2014 7:06 PM To: [facility] Clinical Pool Subject: Non-Urgent Medical Question   Hi, I need referral to see Dr.Donald [name] eye exam on 01/16/14 and 01/22/14 for my insurance. I also need a referral to see [facility] dermatology screeing on 02/27/14. Thank you  Last OV:  10/31/13  Please advise.

## 2014-01-15 NOTE — Telephone Encounter (Signed)
[facility] for referral to [facility] and [facility]

## 2014-01-16 NOTE — Telephone Encounter (Signed)
Referrals placed.  Pt made aware.  No further questions or concerns at this time.

## 2014-02-27 ENCOUNTER — Other Ambulatory Visit: Payer: Self-pay | Admitting: Family Medicine

## 2014-02-27 ENCOUNTER — Encounter: Payer: Self-pay | Admitting: Family Medicine

## 2014-02-27 DIAGNOSIS — M79671 Pain in right foot: Secondary | ICD-10-CM

## 2014-03-03 ENCOUNTER — Encounter: Payer: Self-pay | Admitting: Family Medicine

## 2014-03-03 ENCOUNTER — Ambulatory Visit (INDEPENDENT_AMBULATORY_CARE_PROVIDER_SITE_OTHER): Payer: No Typology Code available for payment source | Admitting: Family Medicine

## 2014-03-03 VITALS — BP 160/84 | HR 93 | Temp 98.9°F | Wt 166.2 lb

## 2014-03-03 DIAGNOSIS — L03031 Cellulitis of right toe: Principal | ICD-10-CM

## 2014-03-03 DIAGNOSIS — Z23 Encounter for immunization: Secondary | ICD-10-CM

## 2014-03-03 DIAGNOSIS — M79674 Pain in right toe(s): Secondary | ICD-10-CM

## 2014-03-03 DIAGNOSIS — L02619 Cutaneous abscess of unspecified foot: Secondary | ICD-10-CM

## 2014-03-03 DIAGNOSIS — L03039 Cellulitis of unspecified toe: Secondary | ICD-10-CM

## 2014-03-03 DIAGNOSIS — L02611 Cutaneous abscess of right foot: Secondary | ICD-10-CM

## 2014-03-03 DIAGNOSIS — M79609 Pain in unspecified limb: Secondary | ICD-10-CM

## 2014-03-03 MED ORDER — CEPHALEXIN 500 MG PO CAPS: 500.0000 mg | ORAL_CAPSULE | Freq: Two times a day (BID) | ORAL | Status: DC

## 2014-03-03 NOTE — Progress Notes (Signed)
   Subjective:    Patient ID: Alyssa Ford, female    DOB: 08/30/1950, 63 y.o.   MRN: [id number]  HPI Pt here c/o con't pain in 5th toe right foot since may-- requesting a referral now.  Pt also c/o blister on big toe that has been since weekend.  She was walking in shoes with no socks.  Blister started small but is now large and red.    Review of Systems As above    Objective:   Physical Exam BP 160/84  Pulse 93  Temp(Src) 98.9 F (37.2 C) (Oral)  Wt 166 lb 3.6 oz (75.4 kg)  SpO2 97% General appearance: alert, cooperative, appears stated age and no distress Extremities: 5th toe R foot-- tender, swollen                 Big toe on R foot-- large blister-- drained after puncturing with sterile needle               Toe red and warm to touch        Assessment & Plan:  1. Toe pain, right 5th toe right foot---pain not improving from May - Ambulatory referral to Podiatry  2. Cellulitis and abscess of toe of right foot Big toe right foot- - cephALEXin (KEFLEX) 500 MG capsule; Take 1 capsule (500 mg total) by mouth 2 (two) times daily.  Dispense: 20 capsule; Refill: 0  3. Need for prophylactic vaccination and inoculation against influenza   - Flu Vaccine QUAD 36+ mos PF IM (Fluarix Quad PF)

## 2014-03-03 NOTE — Progress Notes (Signed)
Pre visit review using our clinic review tool, if applicable. No additional management support is needed unless otherwise documented below in the visit note. 

## 2014-03-03 NOTE — Patient Instructions (Signed)

## 2014-03-09 ENCOUNTER — Encounter: Payer: Self-pay | Admitting: Family Medicine

## 2014-03-13 ENCOUNTER — Encounter: Payer: Self-pay | Admitting: Podiatry

## 2014-03-13 ENCOUNTER — Ambulatory Visit (INDEPENDENT_AMBULATORY_CARE_PROVIDER_SITE_OTHER): Payer: No Typology Code available for payment source

## 2014-03-13 ENCOUNTER — Ambulatory Visit (INDEPENDENT_AMBULATORY_CARE_PROVIDER_SITE_OTHER): Payer: No Typology Code available for payment source | Admitting: Podiatry

## 2014-03-13 VITALS — BP 136/75 | HR 97 | Resp 18

## 2014-03-13 DIAGNOSIS — S92911A Unspecified fracture of right toe(s), initial encounter for closed fracture: Secondary | ICD-10-CM

## 2014-03-13 DIAGNOSIS — R52 Pain, unspecified: Secondary | ICD-10-CM

## 2014-03-13 DIAGNOSIS — S92919A Unspecified fracture of unspecified toe(s), initial encounter for closed fracture: Secondary | ICD-10-CM

## 2014-03-13 NOTE — Patient Instructions (Signed)

## 2014-03-13 NOTE — Progress Notes (Signed)
   Subjective:    Patient ID: Alyssa Ford, female    DOB: Jun 25, 1951, 63 y.o.   MRN: [id number]  HPI   Alyssa Ford, 63 year old female, since the office today for complaints of right fifth toe pain. She states that a couple months ago she dropped a grocery bag on her toe and has since had swelling and pain. No prior treatment. She is able to ambulate without difficulty. Also states that she has a had a blister on her right big toe for which she saw her primary care doctor who lanced the blister and she was placed on antibiotics. Today is her last day of the antibiotic. She denies any redness from the site or any recent drainage. Denies any systemic complaints such as fevers, chills, nausea, vomiting.     Review of Systems  Eyes: Positive for visual disturbance.  Musculoskeletal:       JOINT PAIN  Neurological: Positive for headaches.  All other systems reviewed and are negative.      Objective:   Physical Exam AAO x3, NAD DP/PT pulses palpable 2/4 b/l. CRT < 3sec Protective sensation intact with Derrel Nip monofilament, vibratory sensation intact, Achilles tendon reflex intact. Right fifth digit pain along the proximal phalanx. Mild edema. No pain with range of motion of the MPJ. No metatarsal pain. Previous bulla on the dorsal aspect of hallux which appears to have been lanced. There is no current fluid inside of the area and the area is starting to dry. There is no surrounding erythema, drainage, purulence, any ascending cellulitis, pain. No malodor. MMT 5/5, ROM WNL No leg pain, swelling, warmth.        Assessment & Plan:  63 year old female with right fifth digit proximal phalanx fracture; previous bulla dorsal hallux without any signs of infection -Conservative versus surgical treatment were discussed including alternatives, risks, complications. -X-rays were obtained and reviewed with the patient. There is a fracture of the fifth digit proximal phalanx. -As the  fractures likely not new but due to continued pain and swelling discussed immobilization with buddy splint to the fourth digit.  -Currently no signs of infection to the previous bulla on the dorsal hallux. Discussed not to peel the dry skin off.  -Discussed the importance of daily foot inspection. -Followup in 3 weeks or sooner if any problems are to arise. The differential or any change in symptoms and directed to call the office immediately if any are to occur. Monitor for any clinical signs or symptoms of infection.

## 2014-03-26 ENCOUNTER — Encounter: Payer: Self-pay | Admitting: Medical

## 2014-03-26 ENCOUNTER — Ambulatory Visit (INDEPENDENT_AMBULATORY_CARE_PROVIDER_SITE_OTHER): Payer: No Typology Code available for payment source | Admitting: Medical

## 2014-03-26 VITALS — BP 159/79 | HR 88 | Temp 98.4°F | Ht 66.2 in | Wt 166.2 lb

## 2014-03-26 DIAGNOSIS — J309 Allergic rhinitis, unspecified: Secondary | ICD-10-CM | POA: Insufficient documentation

## 2014-03-26 DIAGNOSIS — R062 Wheezing: Secondary | ICD-10-CM

## 2014-03-26 DIAGNOSIS — J0100 Acute maxillary sinusitis, unspecified: Secondary | ICD-10-CM

## 2014-03-26 DIAGNOSIS — J3089 Other allergic rhinitis: Secondary | ICD-10-CM

## 2014-03-26 MED ORDER — ALBUTEROL SULFATE HFA 108 (90 BASE) MCG/ACT IN AERS: 2.0000 | INHALATION_SPRAY | Freq: Four times a day (QID) | RESPIRATORY_TRACT | Status: DC | PRN

## 2014-03-26 MED ORDER — CEFDINIR 300 MG PO CAPS: 300.0000 mg | ORAL_CAPSULE | Freq: Two times a day (BID) | ORAL | Status: DC

## 2014-03-26 MED ORDER — BENZONATATE 100 MG PO CAPS: 100.0000 mg | ORAL_CAPSULE | Freq: Three times a day (TID) | ORAL | Status: DC | PRN

## 2014-03-26 MED ORDER — BECLOMETHASONE DIPROPIONATE 40 MCG/ACT IN AERS: 2.0000 | INHALATION_SPRAY | Freq: Two times a day (BID) | RESPIRATORY_TRACT | Status: DC

## 2014-03-26 NOTE — Assessment & Plan Note (Signed)
Some symptoms recently and did prescribe fluticasone. These symptoms may have dictated sinus infection.

## 2014-03-26 NOTE — Progress Notes (Signed)
   Subjective:    Patient ID: Alyssa Ford, female    DOB: 01-Dec-1950, 63 y.o.   MRN: [id number]  HPI  Pt in with nasal congestion since Sunday night. Now sinus pressure and teeth pain. Some productive cough. Pt does smoke. About 1 pack a day. Hx of occasioanal bronchitis but not chronic. No wheezing. And no history of asthma. One time used inhaler after bronchitis episode. That was 15 yrs ago.  Some sneezing.    Review of Systems  Constitutional: Negative for fever, chills and fatigue.  HENT: Positive for congestion and sinus pressure. Negative for drooling, ear pain, nosebleeds, postnasal drip, rhinorrhea, sneezing, sore throat and trouble swallowing.        Some upper teeth sensitivity/pressure.  Respiratory: Positive for cough. Negative for chest tightness, shortness of breath and wheezing.        Productive cough.  Cardiovascular: Negative for chest pain and palpitations.  Musculoskeletal: Negative.   Neurological: Negative.   Hematological: Negative for adenopathy. Does not bruise/bleed easily.  Psychiatric/Behavioral: Negative.        Objective:   Physical Exam  Constitutional: She appears well-developed and well-nourished. No distress.  HENT:  Head: Normocephalic and atraumatic.  Right Ear: External ear normal.  Left Ear: External ear normal.  Bilateral maxillary sinus pressure to palpation.  Also boggy red turbinates on nose exam.  Eyes: Conjunctivae and EOM are normal. Pupils are equal, round, and reactive to light. Right eye exhibits no discharge. Left eye exhibits no discharge. No scleral icterus.  Neck: Normal range of motion. Neck supple. No JVD present. No tracheal deviation present. No thyromegaly present.  Cardiovascular: Normal rate, regular rhythm and normal heart sounds.   Pulmonary/Chest: Effort normal. No stridor. No respiratory distress. She has wheezes. She has no rales. She exhibits no tenderness.  Mild shallow breath sounds, even and unlabored. But  she does have diffuse expiratory wheezing throughout.  Lymphadenopathy:    She has no cervical adenopathy.  Skin: She is not diaphoretic.  Psychiatric: She has a normal mood and affect. Her behavior is normal. Judgment and thought content normal.             Assessment & Plan:

## 2014-03-26 NOTE — Assessment & Plan Note (Signed)
Rx of Cefdinir today.

## 2014-03-26 NOTE — Patient Instructions (Signed)
Your appear to have sinus infection with some possible bronchitis. I am prescribing cefdinir antibiotic.For your cough, I  rx'd benzonatate. For your nasal congestion, I rx'd fluticasone.  You are wheezing today and  I prescribed you qvar inhaler and albuterol inhaler.  Follow up in 7 days or as needed for persisting or worsening signs/symptoms.(If you don't improve then would recommend chest xray but this is not indicated today)

## 2014-03-26 NOTE — Assessment & Plan Note (Signed)
Patient is a smoker and on exam has expiratory wheezing. So I went ahead and prescribe Qvar and albuterol. She is also taking antibiotic. If this wheezing or other symptoms persist, advise patient that she would need a chest x-ray patient expressed understanding.

## 2014-03-27 ENCOUNTER — Telehealth: Payer: Self-pay | Admitting: Family Medicine

## 2014-03-27 NOTE — Telephone Encounter (Signed)
Caller name: Markiyah  Call back [phone] Pharmacy:  Reason for call:  Pt states that the Rx's are too expensive.    beclomethasone (QVAR) 40 MCG/ACT inhaler  albuterol (PROVENTIL HFA;VENTOLIN HFA) 108 (90 BASE) MCG/ACT inhaler   wjould like a call back.

## 2014-03-27 NOTE — Telephone Encounter (Signed)
We need to see her formulary-- for qvar but can send albuterol inh to [facility] place [facility]

## 2014-03-27 NOTE — Telephone Encounter (Signed)
Med's too expensive. Please advise     KP

## 2014-03-27 NOTE — Telephone Encounter (Signed)
emmi emailed °

## 2014-03-27 NOTE — Telephone Encounter (Signed)
patient has agreed to call the insurance company to get the formulary.    KP

## 2014-04-03 ENCOUNTER — Encounter: Payer: Self-pay | Admitting: Podiatry

## 2014-04-03 ENCOUNTER — Ambulatory Visit (INDEPENDENT_AMBULATORY_CARE_PROVIDER_SITE_OTHER): Payer: No Typology Code available for payment source

## 2014-04-03 ENCOUNTER — Ambulatory Visit (INDEPENDENT_AMBULATORY_CARE_PROVIDER_SITE_OTHER): Payer: No Typology Code available for payment source | Admitting: Podiatry

## 2014-04-03 VITALS — BP 120/70 | HR 74 | Resp 17

## 2014-04-03 DIAGNOSIS — S92911A Unspecified fracture of right toe(s), initial encounter for closed fracture: Secondary | ICD-10-CM

## 2014-04-03 DIAGNOSIS — R52 Pain, unspecified: Secondary | ICD-10-CM

## 2014-04-03 NOTE — Progress Notes (Signed)
Patient ID: Alyssa Ford, female   DOB: 12/13/1950, 62 y.o.   MRN: [id number]  Subjective: [name] returns the office they for followup evaluation of right fifth toe fracture. She states that since last appointment she had decreased pain and swelling over the area. She continues to have some discomfort with shoe gear with compresses the toe. Also states that the blister on the dorsal aspect of the right first toe is healing. She denies any systemic complaints as fevers, chills, nausea, vomiting. No other complaints at this time. No acute changes since last appointment other than she had bronchitis.  Objective: AAO x3, NAD.  DP/PT pulses palpable bilaterally, CRT less than 3 seconds. Protective sensation intact with Derrel Nip monofilament, vibratory sensation intact, Achilles tendon reflex intact. No tenderness to palpation of the right fifth digit or along the fifth metatarsal or with fifth MTPJ range of motion. There is mild edema although decreased since prior appointment. Toe in rectus position. Blister on the dorsal aspect of the right first digit/distal medial foot healed at this time with new pink scan present. No swelling erythema or drainage or clinical signs of infection. No open lesions. No calf pain, swelling, warmth.  Assessment: 63 year old female fifth digit fracture, result of posterior dorsal  right hallux. -X-rays were obtained and reviewed the patient.  -Treatment options discussed including alternatives, risks, complications.  -Discussed with her that continued to tape the toe in order to help decrease the edema and for splinting. Discussed that since his injury happened and may the fracture may not heal on x-ray and may be a nonunion however we will treat her symptoms at this time.  -Followup in 4 weeks or sooner any problems are to arise or any change in symptoms. In the meantime call the office with any questions, concerns, change in symptoms.

## 2014-04-08 ENCOUNTER — Telehealth: Payer: Self-pay | Admitting: *Deleted

## 2014-04-08 MED ORDER — DIAZEPAM 5 MG PO TABS: ORAL_TABLET | ORAL | Status: DC

## 2014-04-08 NOTE — Telephone Encounter (Signed)
Ok for #30 

## 2014-04-08 NOTE — Telephone Encounter (Addendum)
rx refill - valium 5mg   Last ov- 03/03/14 Last refilled- 11/19/13 #30 / 0 rf  UDS- 02/05/13 low risk.

## 2014-04-08 NOTE — Telephone Encounter (Signed)
Med filled.  

## 2014-05-06 ENCOUNTER — Ambulatory Visit: Payer: No Typology Code available for payment source | Admitting: Podiatry

## 2014-06-09 ENCOUNTER — Telehealth: Payer: Self-pay | Admitting: Family Medicine

## 2014-06-09 MED ORDER — AMITRIPTYLINE HCL 25 MG PO TABS: 50.0000 mg | ORAL_TABLET | Freq: Every day | ORAL | Status: DC

## 2014-06-09 MED ORDER — HYDROCHLOROTHIAZIDE 25 MG PO TABS: 25.0000 mg | ORAL_TABLET | Freq: Every day | ORAL | Status: DC

## 2014-06-09 NOTE — Telephone Encounter (Signed)
Rx faxed.    KP 

## 2014-06-09 NOTE — Telephone Encounter (Signed)
Pt requesting a refill of amitriptyline (ELAVIL) 25 MG tablet & hydrochlorothiazide (HYDRODIURIL) 25 MG tablet

## 2014-06-25 ENCOUNTER — Encounter: Payer: Self-pay | Admitting: Family Medicine

## 2014-06-26 ENCOUNTER — Telehealth: Payer: No Typology Code available for payment source | Admitting: Nurse Practitioner

## 2014-06-26 DIAGNOSIS — R5383 Other fatigue: Secondary | ICD-10-CM

## 2014-06-26 NOTE — Progress Notes (Signed)
I am sorry you are not feeling well. But these e visit are only for th e listed problems and refill cannot be included in that. I am sure you do not have the flu. You can contact your pharmacy and see if they will give you enough metformin to last until they can get in touch with your family doctor Monday.  E visit for Flu like symptoms   We are sorry that you are not feeling well.  Here is how we plan to help! Based on what you have shared with me it looks like you may have possible exposure to a virus that causes influenza.  Influenza or "the flu" is   an infection caused by a respiratory virus. The flu virus is highly contagious and persons who did not receive their yearly flu vaccination may "catch" the flu from close contact.  We have anti-viral medications to treat the viruses that cause this infection. They are not a "cure" and only shorten the course of the infection. These prescriptions are most effective when they are given within the first 2 days of "flu" symptoms. Antiviral medication are indicated if you have a high risk of complications from the flu. You should  also consider an antiviral medication if you are in close contact with someone who is at risk. These medications can help patients avoid complications from the flu  but have side effects that you should know. Possible side effects from Tamiflu or oseltamivir include nausea, vomiting, diarrhea, dizziness, headaches, eye redness, sleep problems or other respiratory symptoms. You should not take Tamiflu if you have an allergy to oseltamivir or any to the ingredients in Tamiflu.  Based upon your symptoms and potential risk factors I recommend that you follow the flu symptoms recommendation that I have listed below.  ANYONE WHO HAS FLU SYMPTOMS SHOULD: . Stay home. The flu is highly contagious and going out or to work exposes others! . Be sure to drink plenty of fluids. Water is fine as well as fruit juices, sodas and electrolyte  beverages. You may want to stay away from caffeine or alcohol. If you are nauseated, try taking small sips of liquids. How do you know if you are getting enough fluid? Your urine should be a pale yellow or almost colorless. . Get rest. . Taking a steamy shower or using a humidifier may help nasal congestion and ease sore throat pain. Using a saline nasal spray works much the same way. . Cough drops, hard candies and sore throat lozenges may ease your cough. . Line up a caregiver. Have someone check on you regularly.   GET HELP RIGHT AWAY IF: . You cannot keep down liquids or your medications. . You become short of breath . Your fell like you are going to pass out or loose consciousness. . Your symptoms persist after you have completed your treatment plan MAKE SURE YOU   Understand these instructions.  Will watch your condition.  Will get help right away if you are not doing well or get worse.  Your e-visit answers were reviewed by a board certified advanced clinical practitioner to complete your personal care plan.  Depending on the condition, your plan could have included both over the counter or prescription medications.  If there is a problem please reply  once you have received a response from your provider.  Your safety is important to [place].  If you have drug allergies check your prescription carefully.    You can use MyChart to  ask questions about today's visit, request a non-urgent call back, or ask for a work or school excuse.  You will get an e-mail in the next two days asking about your experience.  I hope that your e-visit has been valuable and will speed your recovery. Thank you for using e-visits.

## 2014-06-29 MED ORDER — METFORMIN HCL 850 MG PO TABS: ORAL_TABLET | ORAL | Status: DC

## 2014-07-08 ENCOUNTER — Other Ambulatory Visit: Payer: Self-pay | Admitting: Family Medicine

## 2014-07-09 ENCOUNTER — Other Ambulatory Visit: Payer: Self-pay | Admitting: Family Medicine

## 2014-07-09 ENCOUNTER — Encounter: Payer: Self-pay | Admitting: Family Medicine

## 2014-07-09 DIAGNOSIS — Z0100 Encounter for examination of eyes and vision without abnormal findings: Secondary | ICD-10-CM

## 2014-07-11 ENCOUNTER — Encounter: Payer: Self-pay | Admitting: Family Medicine

## 2014-07-16 ENCOUNTER — Encounter: Payer: Self-pay | Admitting: Family Medicine

## 2014-07-17 ENCOUNTER — Ambulatory Visit: Payer: Self-pay | Admitting: Family Medicine

## 2014-07-20 LAB — HM DIABETES EYE EXAM

## 2014-07-21 ENCOUNTER — Encounter: Payer: Self-pay | Admitting: Family Medicine

## 2014-07-21 MED ORDER — GLIMEPIRIDE 2 MG PO TABS: ORAL_TABLET | ORAL | Status: DC

## 2014-08-03 ENCOUNTER — Encounter: Payer: Self-pay | Admitting: Family Medicine

## 2014-08-03 ENCOUNTER — Ambulatory Visit (INDEPENDENT_AMBULATORY_CARE_PROVIDER_SITE_OTHER): Payer: 59 | Admitting: Family Medicine

## 2014-08-03 VITALS — BP 118/62 | HR 81 | Temp 98.0°F | Wt 163.4 lb

## 2014-08-03 DIAGNOSIS — E041 Nontoxic single thyroid nodule: Secondary | ICD-10-CM

## 2014-08-03 DIAGNOSIS — E1100 Type 2 diabetes mellitus with hyperosmolarity without nonketotic hyperglycemic-hyperosmolar coma (NKHHC): Secondary | ICD-10-CM

## 2014-08-03 DIAGNOSIS — I10 Essential (primary) hypertension: Secondary | ICD-10-CM

## 2014-08-03 DIAGNOSIS — E1159 Type 2 diabetes mellitus with other circulatory complications: Secondary | ICD-10-CM

## 2014-08-03 DIAGNOSIS — IMO0002 Reserved for concepts with insufficient information to code with codable children: Secondary | ICD-10-CM

## 2014-08-03 DIAGNOSIS — E1151 Type 2 diabetes mellitus with diabetic peripheral angiopathy without gangrene: Secondary | ICD-10-CM

## 2014-08-03 DIAGNOSIS — E1165 Type 2 diabetes mellitus with hyperglycemia: Secondary | ICD-10-CM

## 2014-08-03 DIAGNOSIS — E785 Hyperlipidemia, unspecified: Secondary | ICD-10-CM

## 2014-08-03 LAB — BASIC METABOLIC PANEL
BUN: 10 mg/dL (ref 6–23)
CO2: 29 mEq/L (ref 19–32)
Calcium: 9.9 mg/dL (ref 8.4–10.5)
Chloride: 99 mEq/L (ref 96–112)
Creatinine, Ser: 0.67 mg/dL (ref 0.40–1.20)
GFR: 94.07 mL/min (ref >60.00)
Glucose, Bld: 125 mg/dL — ABNORMAL HIGH (ref 70–99)
Potassium: 3.7 mEq/L (ref 3.5–5.1)
Sodium: 137 mEq/L (ref 135–145)

## 2014-08-03 LAB — POCT URINALYSIS DIPSTICK
Bilirubin, UA: NEGATIVE
Blood, UA: NEGATIVE
Glucose, UA: NEGATIVE
Ketones, UA: NEGATIVE
Leukocytes, UA: NEGATIVE
Protein, UA: NEGATIVE
Spec Grav, UA: >=1.03
Urobilinogen, UA: NEGATIVE
pH, UA: 6

## 2014-08-03 LAB — HEMOGLOBIN A1C: Hgb A1c MFr Bld: 7.5 % — ABNORMAL HIGH (ref 4.6–6.5)

## 2014-08-03 LAB — LIPID PANEL
Cholesterol: 146 mg/dL (ref 0–200)
HDL: 42.5 mg/dL (ref >39.00)
LDL Cholesterol: 78 mg/dL (ref 0–99)
NonHDL: 103.5
Total CHOL/HDL Ratio: 3
Triglycerides: 127 mg/dL (ref 0.0–149.0)
VLDL: 25.4 mg/dL (ref 0.0–40.0)

## 2014-08-03 LAB — MICROALBUMIN / CREATININE URINE RATIO
Creatinine,U: 29.8 mg/dL
Microalb Creat Ratio: 3 mg/g (ref 0.0–30.0)
Microalb, Ur: 0.9 mg/dL (ref 0.0–1.9)

## 2014-08-03 LAB — HEPATIC FUNCTION PANEL
ALT: 20 U/L (ref 0–35)
AST: 16 U/L (ref 0–37)
Albumin: 4.4 g/dL (ref 3.5–5.2)
Alkaline Phosphatase: 104 U/L (ref 39–117)
Bilirubin, Direct: 0 mg/dL (ref 0.0–0.3)
Total Bilirubin: 0.3 mg/dL (ref 0.2–1.2)
Total Protein: 7.6 g/dL (ref 6.0–8.3)

## 2014-08-03 LAB — TSH: TSH: 2.65 u[IU]/mL (ref 0.35–4.50)

## 2014-08-03 MED ORDER — ATORVASTATIN CALCIUM 80 MG PO TABS
80.0000 mg | ORAL_TABLET | Freq: Every day | ORAL | Status: DC
Start: 2014-08-03 — End: 2015-02-06

## 2014-08-03 MED ORDER — HYDROCHLOROTHIAZIDE 25 MG PO TABS: ORAL_TABLET | ORAL | Status: DC

## 2014-08-03 MED ORDER — DIAZEPAM 5 MG PO TABS: ORAL_TABLET | ORAL | Status: DC

## 2014-08-03 MED ORDER — GLIMEPIRIDE 2 MG PO TABS: ORAL_TABLET | ORAL | Status: DC

## 2014-08-03 MED ORDER — AMITRIPTYLINE HCL 25 MG PO TABS: 50.0000 mg | ORAL_TABLET | Freq: Every day | ORAL | Status: DC

## 2014-08-03 NOTE — Patient Instructions (Signed)
Diabetes and Standards of Medical Care Diabetes is complicated. You may find that your diabetes team includes a dietitian, nurse, diabetes educator, eye doctor, and more. To help everyone know what is going on and to help you get the care you deserve, the following schedule of care was developed to help keep you on track. Below are the tests, exams, vaccines, medicines, education, and plans you will need. HbA1c test This test shows how well you have controlled your glucose over the past 2-3 months. It is used to see if your diabetes management plan needs to be adjusted.   It is performed at least 2 times a year if you are meeting treatment goals.  It is performed 4 times a year if therapy has changed or if you are not meeting treatment goals. Blood pressure test  This test is performed at every routine medical visit. The goal is less than 140/90 mm Hg for most people, but 130/80 mm Hg in some cases. Ask your health care provider about your goal. Dental exam  Follow up with the dentist regularly. Eye exam  If you are diagnosed with type 1 diabetes as a child, get an exam upon reaching the age of 37 years or older and have had diabetes for 3-5 years. Yearly eye exams are recommended after that initial eye exam.  If you are diagnosed with type 1 diabetes as an adult, get an exam within 5 years of diagnosis and then yearly.  If you are diagnosed with type 2 diabetes, get an exam as soon as possible after the diagnosis and then yearly. Foot care exam  Visual foot exams are performed at every routine medical visit. The exams check for cuts, injuries, or other problems with the feet.  A comprehensive foot exam should be done yearly. This includes visual inspection as well as assessing foot pulses and testing for loss of sensation.  Check your feet nightly for cuts, injuries, or other problems with your feet. Tell your health care provider if anything is not healing. Kidney function test (urine  microalbumin)  This test is performed once a year.  Type 1 diabetes: The first test is performed 5 years after diagnosis.  Type 2 diabetes: The first test is performed at the time of diagnosis.  A serum creatinine and estimated glomerular filtration rate (eGFR) test is done once a year to assess the level of chronic kidney disease (CKD), if present. Lipid profile (cholesterol, HDL, LDL, triglycerides)  Performed every 5 years for most people.  The goal for LDL is less than 100 mg/dL. If you are at high risk, the goal is less than 70 mg/dL.  The goal for HDL is 40 mg/dL-50 mg/dL for men and 50 mg/dL-60 mg/dL for women. An HDL cholesterol of 60 mg/dL or higher gives some protection against heart disease.  The goal for triglycerides is less than 150 mg/dL. Influenza vaccine, pneumococcal vaccine, and hepatitis B vaccine  The influenza vaccine is recommended yearly.  It is recommended that people with diabetes who are over 24 years old get the pneumonia vaccine. In some cases, two separate shots may be given. Ask your health care provider if your pneumonia vaccination is up to date.  The hepatitis B vaccine is also recommended for adults with diabetes. Diabetes self-management education  Education is recommended at diagnosis and ongoing as needed. Treatment plan  Your treatment plan is reviewed at every medical visit. Document Released: 04/09/2009 Document Revised: 10/27/2013 Document Reviewed: 11/12/2012 [facility] Patient Information 2015 [place],  [employer]. This information is not intended to replace advice given to you by your health care provider. Make sure you discuss any questions you have with your health care provider.  

## 2014-08-03 NOTE — Progress Notes (Signed)
Pre visit review using our clinic review tool, if applicable. No additional management support is needed unless otherwise documented below in the visit note. 

## 2014-08-03 NOTE — Assessment & Plan Note (Signed)
Check labs con't lipitor  

## 2014-08-03 NOTE — Assessment & Plan Note (Signed)
Check labs  Con' t amaryl and metformin 

## 2014-08-03 NOTE — Assessment & Plan Note (Signed)
Stable  con't hctz  

## 2014-08-03 NOTE — Progress Notes (Signed)
Subjective:    Patient ID: Alyssa Ford, female    DOB: [birth year], 64 y.o.   MRN: [id number]  HPI  Patient here for dm , htn and cholesterol.    HPI HYPERTENSION  Blood pressure range-good  Chest pain- no      Dyspnea- no Lightheadedness- no   Edema- no Other side effects - no   Medication compliance: good Low salt diet- yes  DIABETES  Blood Sugar ranges-88-160  Polyuria- no New Visual problems- no Hypoglycemic symptoms- yes-- shakes --- resolved with glucose  Other side effects-no Medication compliance - good Last eye exam- jan 2016 Foot exam- today  HYPERLIPIDEMIA  Medication compliance- good RUQ pain- no  Muscle aches- no Other side effects-n   Past Medical History  Diagnosis Date  . Diabetes mellitus   . Hyperlipidemia   . Hypertension   . GERD (gastroesophageal reflux disease)   . Thyroid nodule   . Cataract   . Glaucoma   . Anemia   . Diverticulosis of colon (without mention of hemorrhage) 2003, 2008    Colonoscopy  . Hiatal hernia 2003    EGD   . Arthritis   . Chronic headaches     Review of Systems  Constitutional: Negative for diaphoresis, appetite change, fatigue and unexpected weight change.  Eyes: Negative for pain, redness and visual disturbance.  Respiratory: Negative for cough, chest tightness, shortness of breath and wheezing.   Cardiovascular: Negative for chest pain, palpitations and leg swelling.  Endocrine: Negative for cold intolerance, heat intolerance, polydipsia, polyphagia and polyuria.  Genitourinary: Negative for dysuria, frequency and difficulty urinating.  Neurological: Negative for dizziness, light-headedness, numbness and headaches.  Psychiatric/Behavioral: Negative for suicidal ideas, hallucinations, behavioral problems, confusion, sleep disturbance, dysphoric mood, decreased concentration and agitation. The patient is not nervous/anxious and is not hyperactive.        Objective:    Physical Exam    Constitutional: She is oriented to person, place, and time. She appears well-developed and well-nourished.  HENT:  Head: Normocephalic and atraumatic.  Eyes: Conjunctivae and EOM are normal.  Neck: Normal range of motion. Neck supple. No JVD present. Carotid bruit is not present. No thyromegaly present.  Cardiovascular: Normal rate, regular rhythm and normal heart sounds.   No murmur heard. Pulmonary/Chest: Effort normal and breath sounds normal. No respiratory distress. She has no wheezes. She has no rales. She exhibits no tenderness.  Musculoskeletal: She exhibits no edema.  Neurological: She is alert and oriented to person, place, and time.  Psychiatric: She has a normal mood and affect.  Sensory exam of the foot is normal, tested with the monofilament. Good pulses, no lesions or ulcers, good peripheral pulses.  BP 118/62 mmHg  Pulse 81  Temp(Src) 98 F (36.7 C) (Oral)  Wt 163 lb 6.4 oz (74.118 kg)  SpO2 96% Wt Readings from Last 3 Encounters:  08/03/14 163 lb 6.4 oz (74.118 kg)  03/26/14 166 lb 3.2 oz (75.388 kg)  03/03/14 166 lb 3.6 oz (75.4 kg)     Lab Results  Component Value Date   WBC 15.1* 02/05/2013   HGB 13.5 02/05/2013   HCT 41.0 02/05/2013   PLT 302.0 02/05/2013   GLUCOSE 125* 08/03/2014   CHOL 146 08/03/2014   TRIG 127.0 08/03/2014   HDL 42.50 08/03/2014   LDLDIRECT 197.0 08/25/2011   LDLCALC 78 08/03/2014   ALT 20 08/03/2014   AST 16 08/03/2014   NA 137 08/03/2014   K 3.7 08/03/2014   CL 99 08/03/2014  CREATININE 0.67 08/03/2014   BUN 10 08/03/2014   CO2 29 08/03/2014   TSH 2.65 08/03/2014   HGBA1C 7.5* 08/03/2014   MICROALBUR 0.9 08/03/2014    Dg Foot 2 Views Right  10/31/2013   CLINICAL DATA:  Right foot injury  EXAM: RIGHT FOOT - 2 VIEW  COMPARISON:  None.  FINDINGS: Two views of the right foot submitted. No acute fracture or subluxation. No radiopaque foreign body.  IMPRESSION: Negative.   Electronically Signed   By: Lahoma Crocker M.D.   On:  10/31/2013 14:32       Assessment & Plan:   Problem List Items Addressed This Visit    Essential hypertension    Stable con't hctz      Relevant Medications   atorvastatin (LIPITOR) tablet   hydrochlorothiazide tablet   Other Relevant Orders   Basic metabolic panel (Completed)   POCT urinalysis dipstick (Completed)   DM (diabetes mellitus) type II uncontrolled, periph vascular disorder    Check labs Con' t amaryl and metformin       Relevant Medications   atorvastatin (LIPITOR) tablet   glimepiride (AMARYL) tablet   hydrochlorothiazide tablet    Other Visit Diagnoses    DM hyperosmolarity type II, uncontrolled    -  Primary    Relevant Medications    atorvastatin (LIPITOR) tablet    glimepiride (AMARYL) tablet    Other Relevant Orders    Ambulatory referral to Ophthalmology    Ambulatory referral to Podiatry    Hemoglobin A1c (Completed)    Hepatic function panel (Completed)    Microalbumin / creatinine urine ratio (Completed)    POCT urinalysis dipstick (Completed)    Thyroid nodule        Relevant Orders    US Soft Tissue Head/Neck    TSH (Completed)    Hyperlipidemia        Relevant Medications    atorvastatin (LIPITOR) tablet    hydrochlorothiazide tablet    Other Relevant Orders    Lipid panel (Completed)    POCT urinalysis dipstick (Completed)        Garnet Koyanagi, DO

## 2014-08-06 ENCOUNTER — Other Ambulatory Visit: Payer: Self-pay | Admitting: Family Medicine

## 2014-08-06 MED ORDER — METFORMIN HCL 850 MG PO TABS: ORAL_TABLET | ORAL | Status: DC

## 2014-08-07 ENCOUNTER — Other Ambulatory Visit: Payer: Self-pay

## 2014-08-07 MED ORDER — GLIMEPIRIDE 4 MG PO TABS: ORAL_TABLET | ORAL | Status: DC

## 2014-08-19 ENCOUNTER — Encounter: Payer: Self-pay | Admitting: Family Medicine

## 2014-09-11 ENCOUNTER — Ambulatory Visit (INDEPENDENT_AMBULATORY_CARE_PROVIDER_SITE_OTHER): Payer: 59 | Admitting: Podiatry

## 2014-09-11 ENCOUNTER — Encounter: Payer: Self-pay | Admitting: Podiatry

## 2014-09-11 DIAGNOSIS — B351 Tinea unguium: Secondary | ICD-10-CM

## 2014-09-11 DIAGNOSIS — M79676 Pain in unspecified toe(s): Secondary | ICD-10-CM | POA: Diagnosis not present

## 2014-09-11 NOTE — Patient Instructions (Signed)
Diabetes and Foot Care Diabetes may cause you to have problems because of poor blood supply (circulation) to your feet and legs. This may cause the skin on your feet to become thinner, break easier, and heal more slowly. Your skin may become dry, and the skin may peel and crack. You may also have nerve damage in your legs and feet causing decreased feeling in them. You may not notice minor injuries to your feet that could lead to infections or more serious problems. Taking care of your feet is one of the most important things you can do for yourself.  HOME CARE INSTRUCTIONS  Wear shoes at all times, even in the house. Do not go barefoot. Bare feet are easily injured.  Check your feet daily for blisters, cuts, and redness. If you cannot see the bottom of your feet, use a mirror or ask someone for help.  Wash your feet with warm water (do not use hot water) and mild soap. Then pat your feet and the areas between your toes until they are completely dry. Do not soak your feet as this can dry your skin.  Apply a moisturizing lotion or petroleum jelly (that does not contain alcohol and is unscented) to the skin on your feet and to dry, brittle toenails. Do not apply lotion between your toes.  Trim your toenails straight across. Do not dig under them or around the cuticle. File the edges of your nails with an emery board or nail file.  Do not cut corns or calluses or try to remove them with medicine.  Wear clean socks or stockings every day. Make sure they are not too tight. Do not wear knee-high stockings since they may decrease blood flow to your legs.  Wear shoes that fit properly and have enough cushioning. To break in new shoes, wear them for just a few hours a day. This prevents you from injuring your feet. Always look in your shoes before you put them on to be sure there are no objects inside.  Do not cross your legs. This may decrease the blood flow to your feet.  If you find a minor scrape,  cut, or break in the skin on your feet, keep it and the skin around it clean and dry. These areas may be cleansed with mild soap and water. Do not cleanse the area with peroxide, alcohol, or iodine.  When you remove an adhesive bandage, be sure not to damage the skin around it.  If you have a wound, look at it several times a day to make sure it is healing.  Do not use heating pads or hot water bottles. They may burn your skin. If you have lost feeling in your feet or legs, you may not know it is happening until it is too late.  Make sure your health care provider performs a complete foot exam at least annually or more often if you have foot problems. Report any cuts, sores, or bruises to your health care provider immediately. SEEK MEDICAL CARE IF:   You have an injury that is not healing.  You have cuts or breaks in the skin.  You have an ingrown nail.  You notice redness on your legs or feet.  You feel burning or tingling in your legs or feet.  You have pain or cramps in your legs and feet.  Your legs or feet are numb.  Your feet always feel cold. SEEK IMMEDIATE MEDICAL CARE IF:   There is increasing redness,   swelling, or pain in or around a wound.  There is a red line that goes up your leg.  Pus is coming from a wound.  You develop a fever or as directed by your health care provider.  You notice a bad smell coming from an ulcer or wound. Document Released: 06/09/2000 Document Revised: 02/12/2013 Document Reviewed: 11/19/2012 [employer] Patient Information 2015 [employer]. This information is not intended to replace advice given to you by your health care provider. Make sure you discuss any questions you have with your health care provider.  

## 2014-09-14 ENCOUNTER — Encounter: Payer: Self-pay | Admitting: Family Medicine

## 2014-09-14 DIAGNOSIS — Z1231 Encounter for screening mammogram for malignant neoplasm of breast: Secondary | ICD-10-CM

## 2014-09-14 NOTE — Progress Notes (Signed)
Patient ID: LAKEETA DOBOSZ, female   DOB: 1951/05/07, 64 y.o.   MRN: [id number]  Subjective: 64 y.o.-year-old female eturns the office today for painful, elongated, thickened toenails. Denies any redness or drainage around the nails. She states that her right fifth toe is swollen at times however she does not have any pain. Denies any acute changes since last appointment and no new complaints today. Denies any systemic complaints such as fevers, chills, nausea, vomiting.   Objective: AAO 3, NAD DP/PT pulses palpable, CRT less than 3 seconds Protective sensation intact with Simms Weinstein monofilament, Achilles tendon reflex intact.  Nails hypertrophic, dystrophic, elongated, brittle, discolored 4. Nails include bilateral hallux and fifth digit nails. There is tenderness overlying these nails. There is no surrounding erythema or drainage along the nail sites. No open lesions or pre-ulcerative lesions are identified. There is mild edema to the right fifth digit however there is no tenderness to palpation. No pain with MTPJ range of motion. No other areas of tenderness bilateral lower extremities. No overlying edema, erythema, increased warmth. No pain with calf compression, swelling, warmth, erythema.  Assessment: Patient presents with symptomatic onychomycosis  Plan: -Treatment options including alternatives, risks, complications were discussed -Nails sharply debrided 4 without complication/bleeding. -Discussed daily foot inspection. If there are any changes, to call the office immediately.  -Recommended x-rays to right foot however she declines. -Follow-up in 3 months or sooner if any problems are to arise. In the meantime, encouraged to call the office with any questions, concerns, changes symptoms.

## 2014-09-17 NOTE — Addendum Note (Signed)
Addended by: Ewing Schlein on: 09/17/2014 12:09 PM   Modules accepted: Orders

## 2014-09-22 ENCOUNTER — Encounter: Payer: Self-pay | Admitting: Family Medicine

## 2014-09-22 MED ORDER — BUPROPION HCL ER (XL) 150 MG PO TB24: 300.0000 mg | ORAL_TABLET | Freq: Every morning | ORAL | Status: DC

## 2014-10-09 ENCOUNTER — Ambulatory Visit
Admission: RE | Admit: 2014-10-09 | Discharge: 2014-10-09 | Disposition: A | Discharge status: Home / Self Care | Payer: 59 | Source: Ambulatory Visit | Attending: Family Medicine | Admitting: Family Medicine

## 2014-10-09 DIAGNOSIS — Z1231 Encounter for screening mammogram for malignant neoplasm of breast: Secondary | ICD-10-CM

## 2014-10-21 ENCOUNTER — Telehealth: Payer: Self-pay

## 2014-10-21 DIAGNOSIS — E119 Type 2 diabetes mellitus without complications: Secondary | ICD-10-CM

## 2014-10-21 DIAGNOSIS — E785 Hyperlipidemia, unspecified: Secondary | ICD-10-CM

## 2014-10-21 NOTE — Telephone Encounter (Signed)
-----   Message from Irven Baltimore sent at 10/20/2014  9:39 AM EDT ----- Patient scheduled lab appt for 11/03/14. Please enter orders

## 2014-11-03 ENCOUNTER — Other Ambulatory Visit (INDEPENDENT_AMBULATORY_CARE_PROVIDER_SITE_OTHER): Payer: 59

## 2014-11-03 DIAGNOSIS — E785 Hyperlipidemia, unspecified: Secondary | ICD-10-CM | POA: Diagnosis not present

## 2014-11-03 DIAGNOSIS — E119 Type 2 diabetes mellitus without complications: Secondary | ICD-10-CM | POA: Diagnosis not present

## 2014-11-03 LAB — HEPATIC FUNCTION PANEL
ALT: 21 U/L (ref 0–35)
AST: 19 U/L (ref 0–37)
Albumin: 4.1 g/dL (ref 3.5–5.2)
Alkaline Phosphatase: 95 U/L (ref 39–117)
Bilirubin, Direct: 0 mg/dL (ref 0.0–0.3)
Total Bilirubin: 0.3 mg/dL (ref 0.2–1.2)
Total Protein: 7.3 g/dL (ref 6.0–8.3)

## 2014-11-03 LAB — LIPID PANEL
Cholesterol: 133 mg/dL (ref 0–200)
HDL: 44.9 mg/dL (ref >39.00)
LDL Cholesterol: 68 mg/dL (ref 0–99)
NonHDL: 88.1
Total CHOL/HDL Ratio: 3
Triglycerides: 103 mg/dL (ref 0.0–149.0)
VLDL: 20.6 mg/dL (ref 0.0–40.0)

## 2014-11-03 LAB — BASIC METABOLIC PANEL
BUN: 7 mg/dL (ref 6–23)
CO2: 30 mEq/L (ref 19–32)
Calcium: 10 mg/dL (ref 8.4–10.5)
Chloride: 94 mEq/L — ABNORMAL LOW (ref 96–112)
Creatinine, Ser: 0.66 mg/dL (ref 0.40–1.20)
GFR: 95.64 mL/min (ref >60.00)
Glucose, Bld: 145 mg/dL — ABNORMAL HIGH (ref 70–99)
Potassium: 4.1 mEq/L (ref 3.5–5.1)
Sodium: 133 mEq/L — ABNORMAL LOW (ref 135–145)

## 2014-11-03 LAB — HEMOGLOBIN A1C: Hgb A1c MFr Bld: 6.8 % — ABNORMAL HIGH (ref 4.6–6.5)

## 2014-12-18 ENCOUNTER — Ambulatory Visit (INDEPENDENT_AMBULATORY_CARE_PROVIDER_SITE_OTHER): Payer: 59 | Admitting: Podiatry

## 2014-12-18 DIAGNOSIS — B351 Tinea unguium: Secondary | ICD-10-CM | POA: Diagnosis not present

## 2014-12-18 DIAGNOSIS — M79676 Pain in unspecified toe(s): Secondary | ICD-10-CM | POA: Diagnosis not present

## 2014-12-18 DIAGNOSIS — E119 Type 2 diabetes mellitus without complications: Secondary | ICD-10-CM

## 2014-12-18 NOTE — Patient Instructions (Signed)
Continue antibiotic ointment and a band-aid to the 2nd toes. If there are any changes to call the office or if there are any worsening of symptoms.   Monitor for any signs/symptoms of infection. Call the office immediately if any occur or go directly to the emergency room. Call with any questions/concerns.

## 2014-12-19 NOTE — Progress Notes (Signed)
Patient ID: Alyssa Ford, female   DOB: 08/08/1950, 64 y.o.   MRN: [id number]  Subjective: 64 y.o.-year-old female returns the office today for painful, elongated, thickened toenails. Denies any redness or drainage around the nails. She states that she had some blisters on both of her second toes after she wore a pair of sandals for an extended period time causing irritation. She denies any redness from around the area in the posterior separate much dried up at this point. Denies any acute changes since last appointment and no new complaints today. Denies any systemic complaints such as fevers, chills, nausea, vomiting.   Objective: AAO 3, NAD DP/PT pulses palpable, CRT less than 3 seconds Protective sensation intact with Simms Weinstein monofilament, Achilles tendon reflex intact.  Nails hypertrophic, dystrophic, elongated, brittle, discolored 4. There is tenderness overlying the nails of bilateral hallux and 5th digit. There is no surrounding erythema or drainage along the nail sites. On the dorsal aspect of bilateral second digits there is dried appearing bulla which are flaccid with no drainage underneath the area. These areas appear to be almost callus like tissue at this time. There is no swelling erythema, drainage, ascending cellulitis, fluctuance, crepitus or other clinical signs of infection. No open lesions or pre-ulcerative lesions are identified. No other areas of tenderness bilateral lower extremities. No overlying edema, erythema, increased warmth. No pain with calf compression, swelling, warmth, erythema.  Assessment: Patient presents with symptomatic onychomycosis x4  Plan: -Treatment options including alternatives, risks, complications were discussed -Nails sharply debrided without complication/bleeding. -Monitor the blister area bilateral second toe. If the area is not resolved next 2 weeks call the office or if there is any signs or symptoms of infection to call the office  immediately. -Discussed daily foot inspection. If there are any changes, to call the office immediately.  -Follow-up in 3 months or sooner if any problems are to arise. In the meantime, encouraged to call the office with any questions, concerns, changes symptoms.

## 2015-02-06 ENCOUNTER — Other Ambulatory Visit: Payer: Self-pay | Admitting: Family Medicine

## 2015-02-07 ENCOUNTER — Encounter: Payer: Self-pay | Admitting: Family Medicine

## 2015-03-18 ENCOUNTER — Ambulatory Visit (INDEPENDENT_AMBULATORY_CARE_PROVIDER_SITE_OTHER): Payer: Medicare Other | Admitting: Family Medicine

## 2015-03-18 ENCOUNTER — Encounter: Payer: Self-pay | Admitting: Family Medicine

## 2015-03-18 VITALS — BP 142/82 | HR 83 | Temp 98.5°F | Ht 66.0 in | Wt 160.8 lb

## 2015-03-18 DIAGNOSIS — E041 Nontoxic single thyroid nodule: Secondary | ICD-10-CM

## 2015-03-18 DIAGNOSIS — E785 Hyperlipidemia, unspecified: Secondary | ICD-10-CM

## 2015-03-18 DIAGNOSIS — Z720 Tobacco use: Secondary | ICD-10-CM

## 2015-03-18 DIAGNOSIS — F411 Generalized anxiety disorder: Secondary | ICD-10-CM

## 2015-03-18 DIAGNOSIS — F17200 Nicotine dependence, unspecified, uncomplicated: Secondary | ICD-10-CM

## 2015-03-18 DIAGNOSIS — I10 Essential (primary) hypertension: Secondary | ICD-10-CM | POA: Diagnosis not present

## 2015-03-18 DIAGNOSIS — Z23 Encounter for immunization: Secondary | ICD-10-CM

## 2015-03-18 DIAGNOSIS — E1139 Type 2 diabetes mellitus with other diabetic ophthalmic complication: Secondary | ICD-10-CM

## 2015-03-18 DIAGNOSIS — R42 Dizziness and giddiness: Secondary | ICD-10-CM

## 2015-03-18 LAB — COMPREHENSIVE METABOLIC PANEL
ALT: 23 U/L (ref 0–35)
AST: 17 U/L (ref 0–37)
Albumin: 4.3 g/dL (ref 3.5–5.2)
Alkaline Phosphatase: 102 U/L (ref 39–117)
BUN: 11 mg/dL (ref 6–23)
CO2: 30 mEq/L (ref 19–32)
Calcium: 10.2 mg/dL (ref 8.4–10.5)
Chloride: 95 mEq/L — ABNORMAL LOW (ref 96–112)
Creatinine, Ser: 0.72 mg/dL (ref 0.40–1.20)
GFR: 86.4 mL/min (ref >60.00)
Glucose, Bld: 125 mg/dL — ABNORMAL HIGH (ref 70–99)
Potassium: 3.8 mEq/L (ref 3.5–5.1)
Sodium: 135 mEq/L (ref 135–145)
Total Bilirubin: 0.4 mg/dL (ref 0.2–1.2)
Total Protein: 7.6 g/dL (ref 6.0–8.3)

## 2015-03-18 LAB — HEMOGLOBIN A1C: Hgb A1c MFr Bld: 6.9 % — ABNORMAL HIGH (ref 4.6–6.5)

## 2015-03-18 LAB — CBC WITH DIFFERENTIAL/PLATELET
Basophils Absolute: 0.1 10*3/uL (ref 0.0–0.1)
Basophils Relative: 0.6 % (ref 0.0–3.0)
Eosinophils Absolute: 0.1 10*3/uL (ref 0.0–0.7)
Eosinophils Relative: 1.5 % (ref 0.0–5.0)
HCT: 34.2 % — ABNORMAL LOW (ref 36.0–46.0)
Hemoglobin: 11.1 g/dL — ABNORMAL LOW (ref 12.0–15.0)
Lymphocytes Relative: 30 % (ref 12.0–46.0)
Lymphs Abs: 2.9 10*3/uL (ref 0.7–4.0)
MCHC: 32.4 g/dL (ref 30.0–36.0)
MCV: 74.9 fl — ABNORMAL LOW (ref 78.0–100.0)
Monocytes Absolute: 0.5 10*3/uL (ref 0.1–1.0)
Monocytes Relative: 5.6 % (ref 3.0–12.0)
Neutro Abs: 6.1 10*3/uL (ref 1.4–7.7)
Neutrophils Relative %: 62.3 % (ref 43.0–77.0)
Platelets: 318 10*3/uL (ref 150.0–400.0)
RBC: 4.56 Mil/uL (ref 3.87–5.11)
RDW: 18.5 % — ABNORMAL HIGH (ref 11.5–15.5)
WBC: 9.8 10*3/uL (ref 4.0–10.5)

## 2015-03-18 LAB — TSH: TSH: 2.89 u[IU]/mL (ref 0.35–4.50)

## 2015-03-18 LAB — LIPID PANEL
Cholesterol: 135 mg/dL (ref 0–200)
HDL: 40.4 mg/dL (ref >39.00)
LDL Cholesterol: 73 mg/dL (ref 0–99)
NonHDL: 94.78
Total CHOL/HDL Ratio: 3
Triglycerides: 107 mg/dL (ref 0.0–149.0)
VLDL: 21.4 mg/dL (ref 0.0–40.0)

## 2015-03-18 LAB — VITAMIN B12: Vitamin B-12: 137 pg/mL — ABNORMAL LOW (ref 211–911)

## 2015-03-18 MED ORDER — DIAZEPAM 5 MG PO TABS: ORAL_TABLET | ORAL | Status: DC

## 2015-03-18 NOTE — Patient Instructions (Addendum)
[place] for ear wax     Basic Carbohydrate Counting for Diabetes Mellitus Carbohydrate counting is a method for keeping track of the amount of carbohydrates you eat. Eating carbohydrates naturally increases the level of sugar (glucose) in your blood, so it is important for you to know the amount that is okay for you to have in every meal. Carbohydrate counting helps keep the level of glucose in your blood within normal limits. The amount of carbohydrates allowed is different for every person. A dietitian can help you calculate the amount that is right for you. Once you know the amount of carbohydrates you can have, you can count the carbohydrates in the foods you want to eat. Carbohydrates are found in the following foods: 1. Grains, such as breads and cereals. 2. Dried beans and soy products. 3. Starchy vegetables, such as potatoes, peas, and corn. 4. Fruit and fruit juices. 5. Milk and yogurt. 6. Sweets and snack foods, such as cake, cookies, candy, chips, soft drinks, and fruit drinks. CARBOHYDRATE COUNTING There are two ways to count the carbohydrates in your food. You can use either of the methods or a combination of both. Reading the "Nutrition Facts" on [place] The "Nutrition Facts" is an area that is included on the labels of almost all packaged food and beverages in the [place]. It includes the serving size of that food or beverage and information about the nutrients in each serving of the food, including the grams (g) of carbohydrate per serving.  Decide the number of servings of this food or beverage that you will be able to eat or drink. Multiply that number of servings by the number of grams of carbohydrate that is listed on the label for that serving. The total will be the amount of carbohydrates you will be having when you eat or drink this food or beverage. Learning Standard Serving Sizes of Food When you eat food that is not packaged or does not include "Nutrition  Facts" on the label, you need to measure the servings in order to count the amount of carbohydrates.A serving of most carbohydrate-rich foods contains about 15 g of carbohydrates. The following list includes serving sizes of carbohydrate-rich foods that provide 15 g ofcarbohydrate per serving:   1 slice of bread (1 oz) or 1 six-inch tortilla.    of a hamburger bun or English muffin.  4-6 crackers.   cup unsweetened dry cereal.    cup hot cereal.   cup rice or pasta.    cup mashed potatoes or  of a large baked potato.  1 cup fresh fruit or one small piece of fruit.    cup canned or frozen fruit or fruit juice.  1 cup milk.   cup plain fat-free yogurt or yogurt sweetened with artificial sweeteners.   cup cooked dried beans or starchy vegetable, such as peas, corn, or potatoes.  Decide the number of standard-size servings that you will eat. Multiply that number of servings by 15 (the grams of carbohydrates in that serving). For example, if you eat 2 cups of strawberries, you will have eaten 2 servings and 30 g of carbohydrates (2 servings x 15 g = 30 g). For foods such as soups and casseroles, in which more than one food is mixed in, you will need to count the carbohydrates in each food that is included. EXAMPLE OF CARBOHYDRATE COUNTING Sample Dinner  3 oz chicken breast.   cup of brown rice.   cup of corn.  1  cup milk.   1 cup strawberries with sugar-free whipped topping.  Carbohydrate Calculation Step 1: Identify the foods that contain carbohydrates:   Rice.   Corn.   Milk.   Strawberries. Step 2:Calculate the number of servings eaten of each:   2 servings of rice.   1 serving of corn.   1 serving of milk.   1 serving of strawberries. Step 3: Multiply each of those number of servings by 15 g:   2 servings of rice x 15 g = 30 g.   1 serving of corn x 15 g = 15 g.   1 serving of milk x 15 g = 15 g.   1 serving of  strawberries x 15 g = 15 g. Step 4: Add together all of the amounts to find the total grams of carbohydrates eaten: 30 g + 15 g + 15 g + 15 g = 75 g. Document Released: 06/12/2005 Document Revised: 10/27/2013 Document Reviewed: 05/09/2013 [facility] Patient Information 2015 [place], [place]. This information is not intended to replace advice given to you by your health care provider. Make sure you discuss any questions you have with your health care provider.   Epley Maneuver Self-Care WHAT IS THE EPLEY MANEUVER? The Epley maneuver is an exercise you can do to relieve symptoms of benign paroxysmal positional vertigo (BPPV). This condition is often just referred to as vertigo. BPPV is caused by the movement of tiny crystals (canaliths) inside your inner ear. The accumulation and movement of canaliths in your inner ear causes a sudden spinning sensation (vertigo) when you move your head to certain positions. Vertigo usually lasts about 30 seconds. BPPV usually occurs in just one ear. If you get vertigo when you lie on your left side, you probably have BPPV in your left ear. Your health care provider can tell you which ear is involved.  BPPV may be caused by a head injury. Many people older than 50 get BPPV for unknown reasons. If you have been diagnosed with BPPV, your health care provider may teach you how to do this maneuver. BPPV is not life threatening (benign) and usually goes away in time.  WHEN SHOULD I PERFORM THE EPLEY MANEUVER? You can do this maneuver at home whenever you have symptoms of vertigo. You may do the Epley maneuver up to 3 times a day until your symptoms of vertigo go away. HOW SHOULD I DO THE EPLEY MANEUVER? 7. Sit on the edge of a bed or table with your back straight. Your legs should be extended or hanging over the edge of the bed or table.  8. Turn your head halfway toward the affected ear.  9. Lie backward quickly with your head turned until you are lying flat on your back.  You may want to position a pillow under your shoulders.  10. Hold this position for 30 seconds. You may experience an attack of vertigo. This is normal. Hold this position until the vertigo stops. 11. Then turn your head to the opposite direction until your unaffected ear is facing the floor.  12. Hold this position for 30 seconds. You may experience an attack of vertigo. This is normal. Hold this position until the vertigo stops. 13. Now turn your whole body to the same side as your head. Hold for another 30 seconds.  14. You can then sit back up. ARE THERE RISKS TO THIS MANEUVER? In some cases, you may have other symptoms (such as changes in your vision, weakness, or numbness).  If you have these symptoms, stop doing the maneuver and call your health care provider. Even if doing these maneuvers relieves your vertigo, you may still have dizziness. Dizziness is the sensation of light-headedness but without the sensation of movement. Even though the Epley maneuver may relieve your vertigo, it is possible that your symptoms will return within 5 years. WHAT SHOULD I DO AFTER THIS MANEUVER? After doing the Epley maneuver, you can return to your normal activities. Ask your doctor if there is anything you should do at home to prevent vertigo. This may include:  Sleeping with two or more pillows to keep your head elevated.  Not sleeping on the side of your affected ear.  Getting up slowly from bed.  Avoiding sudden movements during the day.  Avoiding extreme head movement, like looking up or bending over.  Wearing a cervical collar to prevent sudden head movements. WHAT SHOULD I DO IF MY SYMPTOMS GET WORSE? Call your health care provider if your vertigo gets worse. Call your provider right way if you have other symptoms, including:   Nausea.  Vomiting.  Headache.  Weakness.  Numbness.  Vision changes. Document Released: 06/17/2013 Document Reviewed: 06/17/2013 [facility] Patient  Information 2015 [place], [place]. This information is not intended to replace advice given to you by your health care provider. Make sure you discuss any questions you have with your health care provider.

## 2015-03-18 NOTE — Progress Notes (Signed)
Patient ID: Alyssa Ford, female    DOB: [birth year]  Age: 64 y.o. MRN: [id number]    Subjective:  Subjective HPI Alyssa Ford presents for f/u dm, cholesterol and bp.  She is a smoker --she is down to a 1/2 ppd but smoked 2 at her height of smoking and has been a smoker since she was 58.    Review of Systems  Constitutional: Negative for diaphoresis, appetite change, fatigue and unexpected weight change.  Eyes: Negative for pain, redness and visual disturbance.  Respiratory: Negative for cough, chest tightness, shortness of breath and wheezing.   Cardiovascular: Negative for chest pain, palpitations and leg swelling.  Endocrine: Negative for cold intolerance, heat intolerance, polydipsia, polyphagia and polyuria.  Genitourinary: Negative for dysuria, frequency and difficulty urinating.  Neurological: Positive for dizziness. Negative for light-headedness, numbness and headaches.  Psychiatric/Behavioral: Negative for decreased concentration. The patient is not nervous/anxious.     History Past Medical History  Diagnosis Date  . Diabetes mellitus   . Hyperlipidemia   . Hypertension   . GERD (gastroesophageal reflux disease)   . Thyroid nodule   . Cataract   . Glaucoma   . Anemia   . Diverticulosis of colon (without mention of hemorrhage) 2003, 2008    Colonoscopy  . Hiatal hernia 2003    EGD   . Arthritis   . Chronic headaches     She has past surgical history that includes Cholecystectomy; Carpal tunnel release (2006); Carpal tunnel release (12/24/2007); and Cervical fusion (3/08).   Her family history includes Breast cancer (age of onset: 11) in her mother; Dementia in her mother; Diabetes in her brother and mother; Diabetes (age of onset: 30) in her father; Hyperlipidemia in her mother; Hypertension in her brother, mother, and sister; Kidney failure (age of onset: 104) in her mother; Parkinsonism in her father. There is no history of Colon cancer.She reports that she has  been smoking Cigarettes.  She has a 24.5 pack-year smoking history. She has never used smokeless tobacco. She reports that she drinks alcohol. She reports that she does not use illicit drugs.  Current Outpatient Prescriptions on File Prior to Visit  Medication Sig Dispense Refill  . amitriptyline (ELAVIL) 25 MG tablet TAKE TWO TABLETS BY MOUTH ONCE DAILY 60 tablet 5  . atorvastatin (LIPITOR) 80 MG tablet TAKE ONE TABLET BY MOUTH ONCE DAILY 30 tablet 5  . BIOTIN PO Take 1 tablet by mouth daily.     Marland Kitchen buPROPion (WELLBUTRIN XL) 150 MG 24 hr tablet Take 2 tablets (300 mg total) by mouth every morning. 60 tablet 5  . glimepiride (AMARYL) 4 MG tablet TAKE ONE TABLET BY MOUTH ONCE DAILY BEFORE BREAKFAST 30 tablet 5  . hydrochlorothiazide (HYDRODIURIL) 25 MG tablet TAKE ONE TABLET BY MOUTH ONCE DAILY 30 tablet 5  . latanoprost (XALATAN) 0.005 % ophthalmic solution     . metFORMIN (GLUCOPHAGE) 850 MG tablet TAKE TWO TABLETS BY MOUTH IN THE MORNING AND ONE IN THE EVENING 90 tablet 5  . Naproxen Sodium (ALEVE PO) Take 2 tablets by mouth daily.    . NON FORMULARY Apply 1 drop topically 2 (two) times a week. Liquid hair drops    . omeprazole (PRILOSEC) 20 MG capsule Take 20 mg by mouth daily.       No current facility-administered medications on file prior to visit.     Objective:  Objective Physical Exam  Constitutional: She is oriented to person, place, and time. She appears well-developed and well-nourished.  HENT:  Head: Normocephalic and atraumatic.  + cerumen impaction b/l   Eyes: Conjunctivae and EOM are normal.  Neck: Normal range of motion. Neck supple. No JVD present. Carotid bruit is not present. No thyromegaly present.  Cardiovascular: Normal rate, regular rhythm and normal heart sounds.   No murmur heard. Pulmonary/Chest: Effort normal and breath sounds normal. No respiratory distress. She has no wheezes. She has no rales. She exhibits no tenderness.  Musculoskeletal: She exhibits no  edema.  Neurological: She is alert and oriented to person, place, and time.  Psychiatric: She has a normal mood and affect. Her behavior is normal.  Nursing note and vitals reviewed.  BP 142/82 mmHg  Pulse 83  Temp(Src) 98.5 F (36.9 C) (Oral)  Ht $R'5\' 6"'la$  (1.676 m)  Wt 160 lb 12.8 oz (72.938 kg)  BMI 25.97 kg/m2  SpO2 97% Wt Readings from Last 3 Encounters:  03/18/15 160 lb 12.8 oz (72.938 kg)  08/03/14 163 lb 6.4 oz (74.118 kg)  03/26/14 166 lb 3.2 oz (75.388 kg)     Lab Results  Component Value Date   WBC 15.1* 02/05/2013   HGB 13.5 02/05/2013   HCT 41.0 02/05/2013   PLT 302.0 02/05/2013   GLUCOSE 145* 11/03/2014   CHOL 133 11/03/2014   TRIG 103.0 11/03/2014   HDL 44.90 11/03/2014   LDLDIRECT 197.0 08/25/2011   LDLCALC 68 11/03/2014   ALT 21 11/03/2014   AST 19 11/03/2014   NA 133* 11/03/2014   K 4.1 11/03/2014   CL 94* 11/03/2014   CREATININE 0.66 11/03/2014   BUN 7 11/03/2014   CO2 30 11/03/2014   TSH 2.65 08/03/2014   HGBA1C 6.8* 11/03/2014   MICROALBUR 0.9 08/03/2014    Mm Screening Breast Tomo Bilateral  10/12/2014   CLINICAL DATA:  Screening.  EXAM: DIGITAL SCREENING BILATERAL MAMMOGRAM WITH 3D TOMO WITH CAD  COMPARISON:  Previous exam(s).  ACR Breast Density Category b: There are scattered areas of fibroglandular density.  FINDINGS: There are no findings suspicious for malignancy. Images were processed with CAD.  IMPRESSION: No mammographic evidence of malignancy. A result letter of this screening mammogram will be mailed directly to the patient.  RECOMMENDATION: Screening mammogram in one year. (Code:[id number]-B-01Y)  BI-RADS CATEGORY  1: Negative.   Electronically Signed   By: Altamese Cabal M.D.   On: 10/12/2014 11:29     Assessment & Plan:  Plan I am having [name] maintain her omeprazole, BIOTIN PO, NON FORMULARY, Naproxen Sodium (ALEVE PO), latanoprost, buPROPion, atorvastatin, amitriptyline, glimepiride, metFORMIN, hydrochlorothiazide, and  diazepam.  Meds ordered this encounter  Medications  . diazepam (VALIUM) 5 MG tablet    Sig: TAKE ONE TABLET BY MOUTH AT BEDTIME AS NEEDED FOR ANXIETY    Dispense:  30 tablet    Refill:  2    Problem List Items Addressed This Visit    Essential hypertension   Relevant Orders   Comp Met (CMET)   CBC with Differential/Platelet   POCT urinalysis dipstick    Other Visit Diagnoses    Diabetes mellitus with ophthalmic manifestation    -  Primary    Relevant Orders    Hemoglobin A1c    POCT urinalysis dipstick    Hyperlipidemia        Relevant Orders    Comp Met (CMET)    Lipid panel    POCT urinalysis dipstick    Dizziness and giddiness        Relevant Medications    diazepam (VALIUM) 5  MG tablet    Other Relevant Orders    Comp Met (CMET)    CBC with Differential/Platelet    TSH    Vitamin B12    Generalized anxiety disorder        Relevant Medications    diazepam (VALIUM) 5 MG tablet    Current smoker        Relevant Orders    Ambulatory Referral for Lung Cancer Scre       Follow-up: Return if symptoms worsen or fail to improve, for hypertension, hyperlipidemia, diabetes II.  Garnet Koyanagi, DO

## 2015-03-19 ENCOUNTER — Ambulatory Visit (INDEPENDENT_AMBULATORY_CARE_PROVIDER_SITE_OTHER): Payer: Medicare Other | Admitting: Podiatry

## 2015-03-19 ENCOUNTER — Encounter: Payer: Self-pay | Admitting: Podiatry

## 2015-03-19 DIAGNOSIS — B351 Tinea unguium: Secondary | ICD-10-CM | POA: Diagnosis not present

## 2015-03-19 DIAGNOSIS — M79676 Pain in unspecified toe(s): Secondary | ICD-10-CM

## 2015-03-19 DIAGNOSIS — E119 Type 2 diabetes mellitus without complications: Secondary | ICD-10-CM

## 2015-03-19 NOTE — Progress Notes (Signed)
Patient ID: ABENI FINCHUM, female   DOB: [birth year], 64 y.o.   MRN: [id number]  Subjective: 64 y.o.-year-old female returns the office today for painful, elongated, thickened toenails which she is unable to trim herself. Denies any redness or drainage around the nails.  Denies any acute changes since last appointment and no new complaints today. Denies any systemic complaints such as fevers, chills, nausea, vomiting.   Objective: AAO 3, NAD DP/PT pulses palpable, CRT less than 3 seconds Protective sensation intact with Simms Weinstein monofilament, Achilles tendon reflex intact.  Nails hypertrophic, dystrophic, elongated, brittle, discolored 4. There is tenderness overlying the nails of bilateral hallux and 5th digit. There is no surrounding erythema or drainage along the nail sites. The previous dried bulla sites have resolved and currently not present.  No open lesions or pre-ulcerative lesions are identified. No other areas of tenderness bilateral lower extremities. No overlying edema, erythema, increased warmth. Adductovarus of bilateral 5th digits.  No pain with calf compression, swelling, warmth, erythema.  Assessment: Patient presents with symptomatic onychomycosis x4  Plan: -Treatment options including alternatives, risks, complications were discussed -Nails sharply debrided without complication/bleeding. -Discussed daily foot inspection. If there are any changes, to call the office immediately.  -Follow-up in 3 months or sooner if any problems are to arise. In the meantime, encouraged to call the office with any questions, concerns, changes symptoms.  Celesta Gentile, DPM

## 2015-03-22 ENCOUNTER — Telehealth: Payer: Self-pay | Admitting: Family Medicine

## 2015-03-22 NOTE — Telephone Encounter (Signed)
Returned patient call. See lab note.

## 2015-03-22 NOTE — Telephone Encounter (Signed)
Returning your call. Best # [phone].

## 2015-03-30 ENCOUNTER — Ambulatory Visit (INDEPENDENT_AMBULATORY_CARE_PROVIDER_SITE_OTHER): Payer: Medicare Other

## 2015-03-30 DIAGNOSIS — E538 Deficiency of other specified B group vitamins: Secondary | ICD-10-CM | POA: Diagnosis not present

## 2015-03-30 MED ORDER — CYANOCOBALAMIN 1000 MCG/ML IJ SOLN
1000.0000 ug | Freq: Once | INTRAMUSCULAR | Status: AC
  Administered 2015-03-30: 1000 ug via INTRAMUSCULAR

## 2015-03-30 NOTE — Addendum Note (Signed)
Addended by: Vernie Shanks E on: 03/30/2015 11:23 AM   Modules accepted: Orders

## 2015-04-06 ENCOUNTER — Ambulatory Visit (INDEPENDENT_AMBULATORY_CARE_PROVIDER_SITE_OTHER): Payer: Medicare Other

## 2015-04-06 DIAGNOSIS — E538 Deficiency of other specified B group vitamins: Secondary | ICD-10-CM | POA: Diagnosis not present

## 2015-04-06 MED ORDER — CYANOCOBALAMIN 1000 MCG/ML IJ SOLN
1000.0000 ug | Freq: Once | INTRAMUSCULAR | Status: AC
  Administered 2015-04-06: 1000 ug via INTRAMUSCULAR

## 2015-04-06 NOTE — Progress Notes (Signed)
Pre visit review using our clinic review tool, if applicable. No additional management support is needed unless otherwise documented below in the visit note.  Patient in for B12 injection. Tolerated well.

## 2015-04-08 DIAGNOSIS — H2513 Age-related nuclear cataract, bilateral: Secondary | ICD-10-CM | POA: Diagnosis not present

## 2015-04-08 DIAGNOSIS — H401131 Primary open-angle glaucoma, bilateral, mild stage: Secondary | ICD-10-CM | POA: Diagnosis not present

## 2015-04-08 LAB — HM DIABETES EYE EXAM

## 2015-04-13 ENCOUNTER — Ambulatory Visit (INDEPENDENT_AMBULATORY_CARE_PROVIDER_SITE_OTHER): Payer: Medicare Other

## 2015-04-13 ENCOUNTER — Other Ambulatory Visit: Payer: Self-pay | Admitting: Acute Care

## 2015-04-13 ENCOUNTER — Other Ambulatory Visit: Payer: Self-pay | Admitting: Family Medicine

## 2015-04-13 DIAGNOSIS — E538 Deficiency of other specified B group vitamins: Secondary | ICD-10-CM

## 2015-04-13 DIAGNOSIS — F17210 Nicotine dependence, cigarettes, uncomplicated: Secondary | ICD-10-CM

## 2015-04-13 MED ORDER — CYANOCOBALAMIN 1000 MCG/ML IJ SOLN
1000.0000 ug | Freq: Once | INTRAMUSCULAR | Status: AC
Start: 2015-04-13 — End: 2015-04-13
  Administered 2015-04-13: 1000 ug via INTRAMUSCULAR

## 2015-04-13 NOTE — Progress Notes (Signed)
Pre visit review using our clinic review tool, if applicable. No additional management support is needed unless otherwise documented below in the visit note.  Patient in for B12 injection.  2ml given IM Left deltoid. Patient tolerated well

## 2015-04-19 ENCOUNTER — Other Ambulatory Visit: Payer: Medicare Other

## 2015-04-20 ENCOUNTER — Ambulatory Visit (INDEPENDENT_AMBULATORY_CARE_PROVIDER_SITE_OTHER): Payer: Medicare Other

## 2015-04-20 ENCOUNTER — Encounter: Payer: Self-pay | Admitting: Family Medicine

## 2015-04-20 ENCOUNTER — Other Ambulatory Visit: Payer: Self-pay | Admitting: Family Medicine

## 2015-04-20 DIAGNOSIS — E785 Hyperlipidemia, unspecified: Secondary | ICD-10-CM

## 2015-04-20 DIAGNOSIS — E538 Deficiency of other specified B group vitamins: Secondary | ICD-10-CM

## 2015-04-20 DIAGNOSIS — I10 Essential (primary) hypertension: Secondary | ICD-10-CM

## 2015-04-20 DIAGNOSIS — E1151 Type 2 diabetes mellitus with diabetic peripheral angiopathy without gangrene: Secondary | ICD-10-CM

## 2015-04-20 NOTE — Progress Notes (Signed)
Order is in.

## 2015-04-20 NOTE — Progress Notes (Signed)
Pre visit review using our clinic review tool, if applicable. No additional management support is needed unless otherwise documented below in the visit note.  Patient in for B12 injection 

## 2015-04-21 ENCOUNTER — Telehealth: Payer: Self-pay | Admitting: Acute Care

## 2015-04-21 ENCOUNTER — Encounter: Payer: Self-pay | Admitting: Acute Care

## 2015-04-21 ENCOUNTER — Ambulatory Visit (INDEPENDENT_AMBULATORY_CARE_PROVIDER_SITE_OTHER): Payer: Medicare Other | Admitting: Acute Care

## 2015-04-21 ENCOUNTER — Ambulatory Visit (INDEPENDENT_AMBULATORY_CARE_PROVIDER_SITE_OTHER)
Admission: RE | Admit: 2015-04-21 | Discharge: 2015-04-21 | Disposition: A | Discharge status: Home / Self Care | Payer: Medicare Other | Source: Ambulatory Visit | Attending: Acute Care | Admitting: Acute Care

## 2015-04-21 DIAGNOSIS — F17210 Nicotine dependence, cigarettes, uncomplicated: Secondary | ICD-10-CM | POA: Diagnosis not present

## 2015-04-21 DIAGNOSIS — Z122 Encounter for screening for malignant neoplasm of respiratory organs: Secondary | ICD-10-CM

## 2015-04-21 DIAGNOSIS — Z87891 Personal history of nicotine dependence: Secondary | ICD-10-CM | POA: Diagnosis not present

## 2015-04-21 NOTE — Progress Notes (Signed)
Shared Decision Making Visit Lung Cancer Screening Program [phone])   Eligibility:  Age 64 y.o.  Pack Years Smoking History Calculation 66 pack years (# packs/per year x # years smoked)  Recent History of coughing up blood  no  Unexplained weight loss? no ( >Than 15 pounds within the last 6 months )  Prior History Lung / other cancer no (Diagnosis within the last 5 years already requiring surveillance chest CT Scans).  Smoking Status Current Smoker  Former Smokers: Years since quit:NA  Quit Date: NA  Visit Components:  Discussion included one or more decision making aids. yes  Discussion included risk/benefits of screening. yes  Discussion included potential follow up diagnostic testing for abnormal scans. yes  Discussion included meaning and risk of over diagnosis. yes  Discussion included meaning and risk of False Positives. yes  Discussion included meaning of total radiation exposure. yes  Counseling Included:  Importance of adherence to annual lung cancer LDCT screening. yes  Impact of comorbidities on ability to participate in the program. yes  Ability and willingness to under diagnostic treatment. yes  Smoking Cessation Counseling:  Current Smokers:   Discussed importance of smoking cessation. yes  Information about tobacco cessation classes and interventions provided to patient. yes  Patient provided with "ticket" for LDCT Scan. yes  Symptomatic Patient. no  Counseling  Diagnosis Code: Tobacco Use Z72.0  Asymptomatic Patient yes  Counseling (Intermediate counseling: > three minutes counseling) [id number]  Former Smokers:   Discussed the importance of maintaining cigarette abstinence.NA; Current smoker, see above  Diagnosis Code: Personal History of Nicotine Dependence. H96.222  Information about tobacco cessation classes and interventions provided to patient. Yes, see above  Patient provided with "ticket" for LDCT Scan. yes  Written Order for  Lung Cancer Screening with LDCT placed in Epic. Yes (CT Chest Lung Cancer Screening Low Dose W/O CM) [id number] Z12.2-Screening of respiratory organs Z87.891-Personal history of nicotine dependence  I spent 15 minutes of face to face time discussing the risks and benefits of lung cancer screening with Ms. Ryans. We viewed a power point together that high lighted  the topics noted above, pausing at intervals to allow for questions to be asked and answered to ensure understanding. We discussed that the single most powerful action that Ms. Sudphin can take to decrease her  risk of developing lung cancer is to quit smoking. She is not ready to quit at present. She has restarted Wellbutrin, which she states has been helping her to smoke less than when she does not take it. We discussed the additional options of nicotine replacement therapy, and behavior modification and also the [employer] classes. I gave her the "be stronger than your excuses " card with information about resources in the community and free nicotine replacement therapy. I also gave her the contact information for the Quit Smart classes both at [place]. I have told her to call me when she is ready to quit and we will make sure she has the resources to be successful. We discussed the time and location of her scan, and that I will call with the results within 24 to 48 hours of having them myself. I have provided her with a copy of the power point we reviewed together as a reference, in addition to my card and contact information in the event she has any further questions or concerns.She verbalized understanding of all of the above and had no further questions upon leaving the office.  Magdalen Spatz, NP

## 2015-04-21 NOTE — Telephone Encounter (Signed)
I called to give Alyssa Ford the results of her low dose CT. I explained that her scan was read as a Lung RADS 2, nodules with a very low likelihood of becoming a clinically active cancer due to lack of size or growth. The recommendation is for a repeat scan in 12 months, which I have told her we will call her to schedule in Oct. 2017. She verbalized understanding of the results and had no further questions upon ending the call. I reminded her that if there were any changes in her health that she should call either me or Dr. Etter Sjogren prior to 1 year  to allow [place] to follow up sooner. She verbalized understanding.

## 2015-04-27 ENCOUNTER — Other Ambulatory Visit (INDEPENDENT_AMBULATORY_CARE_PROVIDER_SITE_OTHER): Payer: Medicare Other

## 2015-04-27 ENCOUNTER — Ambulatory Visit: Payer: Medicare Other

## 2015-04-27 DIAGNOSIS — I10 Essential (primary) hypertension: Secondary | ICD-10-CM | POA: Diagnosis not present

## 2015-04-27 DIAGNOSIS — E785 Hyperlipidemia, unspecified: Secondary | ICD-10-CM

## 2015-04-27 DIAGNOSIS — E1151 Type 2 diabetes mellitus with diabetic peripheral angiopathy without gangrene: Secondary | ICD-10-CM | POA: Diagnosis not present

## 2015-04-27 LAB — COMPREHENSIVE METABOLIC PANEL
ALT: 19 U/L (ref 0–35)
AST: 8 U/L (ref 0–37)
Albumin: 4.4 g/dL (ref 3.5–5.2)
Alkaline Phosphatase: 113 U/L (ref 39–117)
BUN: 16 mg/dL (ref 6–23)
CO2: 30 mEq/L (ref 19–32)
Calcium: 9.8 mg/dL (ref 8.4–10.5)
Chloride: 95 mEq/L — ABNORMAL LOW (ref 96–112)
Creatinine, Ser: 0.76 mg/dL (ref 0.40–1.20)
GFR: 81.15 mL/min (ref >60.00)
Glucose, Bld: 121 mg/dL — ABNORMAL HIGH (ref 70–99)
Potassium: 3.8 mEq/L (ref 3.5–5.1)
Sodium: 136 mEq/L (ref 135–145)
Total Bilirubin: 0.3 mg/dL (ref 0.2–1.2)
Total Protein: 7.7 g/dL (ref 6.0–8.3)

## 2015-04-27 LAB — HEPATITIS C ANTIBODY: HCV Ab: NEGATIVE

## 2015-04-27 LAB — LIPID PANEL
Cholesterol: 150 mg/dL (ref 0–200)
HDL: 42.2 mg/dL (ref >39.00)
LDL Cholesterol: 80 mg/dL (ref 0–99)
NonHDL: 107.5
Total CHOL/HDL Ratio: 4
Triglycerides: 138 mg/dL (ref 0.0–149.0)
VLDL: 27.6 mg/dL (ref 0.0–40.0)

## 2015-04-27 LAB — HEMOGLOBIN A1C: Hgb A1c MFr Bld: 7 % — ABNORMAL HIGH (ref 4.6–6.5)

## 2015-04-28 ENCOUNTER — Other Ambulatory Visit: Payer: Self-pay

## 2015-04-28 MED ORDER — METFORMIN HCL 1000 MG PO TABS: 1000.0000 mg | ORAL_TABLET | Freq: Two times a day (BID) | ORAL | Status: DC

## 2015-04-29 ENCOUNTER — Encounter: Payer: Self-pay | Admitting: Family Medicine

## 2015-04-29 DIAGNOSIS — E538 Deficiency of other specified B group vitamins: Secondary | ICD-10-CM

## 2015-05-04 ENCOUNTER — Other Ambulatory Visit (INDEPENDENT_AMBULATORY_CARE_PROVIDER_SITE_OTHER): Payer: Medicare Other

## 2015-05-04 DIAGNOSIS — E538 Deficiency of other specified B group vitamins: Secondary | ICD-10-CM | POA: Diagnosis not present

## 2015-05-04 LAB — VITAMIN B12: Vitamin B-12: 253 pg/mL (ref 211–911)

## 2015-05-12 DIAGNOSIS — L821 Other seborrheic keratosis: Secondary | ICD-10-CM | POA: Diagnosis not present

## 2015-05-25 ENCOUNTER — Ambulatory Visit (INDEPENDENT_AMBULATORY_CARE_PROVIDER_SITE_OTHER): Payer: Medicare Other

## 2015-05-25 DIAGNOSIS — E538 Deficiency of other specified B group vitamins: Secondary | ICD-10-CM

## 2015-05-25 MED ORDER — CYANOCOBALAMIN 1000 MCG/ML IJ SOLN
1000.0000 ug | Freq: Once | INTRAMUSCULAR | Status: AC
  Administered 2015-05-25: 1000 ug via INTRAMUSCULAR

## 2015-05-25 NOTE — Progress Notes (Signed)
Pre visit review using our clinic review tool, if applicable. No additional management support is needed unless otherwise documented below in the visit note.  Patient in for B12 injection. Given Im Left deltoid. Patient tolerated well Patient has B12 deficiency

## 2015-06-10 DIAGNOSIS — H401131 Primary open-angle glaucoma, bilateral, mild stage: Secondary | ICD-10-CM | POA: Diagnosis not present

## 2015-06-10 DIAGNOSIS — H2513 Age-related nuclear cataract, bilateral: Secondary | ICD-10-CM | POA: Diagnosis not present

## 2015-06-17 ENCOUNTER — Ambulatory Visit (INDEPENDENT_AMBULATORY_CARE_PROVIDER_SITE_OTHER): Payer: Medicare Other

## 2015-06-17 DIAGNOSIS — E538 Deficiency of other specified B group vitamins: Secondary | ICD-10-CM | POA: Diagnosis not present

## 2015-06-17 MED ORDER — CYANOCOBALAMIN 1000 MCG/ML IJ SOLN
1000.0000 ug | Freq: Once | INTRAMUSCULAR | Status: AC
  Administered 2015-06-17: 1000 ug via INTRAMUSCULAR

## 2015-06-17 NOTE — Progress Notes (Signed)
.  Pre visit review using our clinic review tool, if applicable. No additional management support is needed unless otherwise documented below in the visit note.  Patient in for B12 injection. Given IM Right deltoid. Return appointment scheduled

## 2015-06-22 DIAGNOSIS — J0100 Acute maxillary sinusitis, unspecified: Secondary | ICD-10-CM | POA: Diagnosis not present

## 2015-06-25 ENCOUNTER — Encounter: Payer: Self-pay | Admitting: Podiatry

## 2015-06-25 ENCOUNTER — Ambulatory Visit (INDEPENDENT_AMBULATORY_CARE_PROVIDER_SITE_OTHER): Payer: Medicare Other | Admitting: Podiatry

## 2015-06-25 DIAGNOSIS — M79676 Pain in unspecified toe(s): Secondary | ICD-10-CM

## 2015-06-25 DIAGNOSIS — B351 Tinea unguium: Secondary | ICD-10-CM | POA: Diagnosis not present

## 2015-06-25 NOTE — Progress Notes (Signed)
Patient ID: Alyssa Ford, female   DOB: 07/19/1950, 64 y.o.   MRN: [id number]  Subjective: 64 y.o.-year-old female returns the office today for painful, elongated, thickened toenails which she is unable to trim herself. Denies any redness or drainage around the nails.  Denies any acute changes since last appointment and no new complaints today. Denies any systemic complaints such as fevers, chills, nausea, vomiting.   Objective: AAO 3, NAD DP/PT pulses palpable, CRT less than 3 seconds Protective sensation intact with Simms Weinstein monofilament, Achilles tendon reflex intact.  Nails hypertrophic, dystrophic, elongated, brittle, discolored 4. There is tenderness overlying the nails of bilateral hallux and 5th digit. There is no surrounding erythema or drainage along the nail sites. The previous dried bulla sites have resolved and currently not present.  No open lesions or pre-ulcerative lesions are identified. No other areas of tenderness bilateral lower extremities. No overlying edema, erythema, increased warmth. Adductovarus of bilateral 5th digits.  No pain with calf compression, swelling, warmth, erythema.  Assessment: Patient presents with symptomatic onychomycosis x4  Plan: -Treatment options including alternatives, risks, complications were discussed -Nails sharply debrided without complication/bleeding. -Discussed daily foot inspection. If there are any changes, to call the office immediately.  -Follow-up in 3 months or sooner if any problems are to arise. In the meantime, encouraged to call the office with any questions, concerns, changes symptoms.  Jamiria Langill, DPM  

## 2015-07-16 ENCOUNTER — Ambulatory Visit: Payer: Medicare Other

## 2015-07-23 ENCOUNTER — Ambulatory Visit (INDEPENDENT_AMBULATORY_CARE_PROVIDER_SITE_OTHER): Payer: Medicare Other | Admitting: *Deleted

## 2015-07-23 DIAGNOSIS — E538 Deficiency of other specified B group vitamins: Secondary | ICD-10-CM | POA: Diagnosis not present

## 2015-07-23 MED ORDER — CYANOCOBALAMIN 1000 MCG/ML IJ SOLN
1000.0000 ug | Freq: Once | INTRAMUSCULAR | Status: AC
  Administered 2015-07-23: 1000 ug via INTRAMUSCULAR

## 2015-07-23 NOTE — Progress Notes (Signed)
Pre visit review using our clinic review tool, if applicable. No additional management support is needed unless otherwise documented below in the visit note.  Pt tolerated injection well.   Next appt: 08/24/2015  Dorrene German, RN

## 2015-08-06 ENCOUNTER — Other Ambulatory Visit: Payer: Self-pay | Admitting: Family Medicine

## 2015-08-09 ENCOUNTER — Other Ambulatory Visit: Payer: Self-pay | Admitting: Family Medicine

## 2015-08-09 ENCOUNTER — Telehealth: Payer: Self-pay | Admitting: Family Medicine

## 2015-08-09 DIAGNOSIS — E119 Type 2 diabetes mellitus without complications: Secondary | ICD-10-CM

## 2015-08-09 DIAGNOSIS — E785 Hyperlipidemia, unspecified: Secondary | ICD-10-CM

## 2015-08-09 NOTE — Telephone Encounter (Signed)
Pt called to schedule labs stating metformin lab said they are due. Scheduled for 08/10/15.

## 2015-08-09 NOTE — Telephone Encounter (Signed)
Orders in..     KP 

## 2015-08-09 NOTE — Telephone Encounter (Signed)
Last seen 03/18/15 and filled 02/08/15 #[id number] with 5 rf   Please advise    KP

## 2015-08-10 ENCOUNTER — Other Ambulatory Visit (INDEPENDENT_AMBULATORY_CARE_PROVIDER_SITE_OTHER): Payer: Medicare Other

## 2015-08-10 DIAGNOSIS — E119 Type 2 diabetes mellitus without complications: Secondary | ICD-10-CM

## 2015-08-10 DIAGNOSIS — E785 Hyperlipidemia, unspecified: Secondary | ICD-10-CM | POA: Diagnosis not present

## 2015-08-10 LAB — COMPREHENSIVE METABOLIC PANEL
ALT: 21 U/L (ref 0–35)
AST: 15 U/L (ref 0–37)
Albumin: 4.3 g/dL (ref 3.5–5.2)
Alkaline Phosphatase: 97 U/L (ref 39–117)
BUN: 11 mg/dL (ref 6–23)
CO2: 34 mEq/L — ABNORMAL HIGH (ref 19–32)
Calcium: 10 mg/dL (ref 8.4–10.5)
Chloride: 96 mEq/L (ref 96–112)
Creatinine, Ser: 0.7 mg/dL (ref 0.40–1.20)
GFR: 89.15 mL/min (ref >60.00)
Glucose, Bld: 126 mg/dL — ABNORMAL HIGH (ref 70–99)
Potassium: 4.4 mEq/L (ref 3.5–5.1)
Sodium: 136 mEq/L (ref 135–145)
Total Bilirubin: 0.3 mg/dL (ref 0.2–1.2)
Total Protein: 7.3 g/dL (ref 6.0–8.3)

## 2015-08-10 LAB — LIPID PANEL
Cholesterol: 141 mg/dL (ref 0–200)
HDL: 53.6 mg/dL (ref >39.00)
LDL Cholesterol: 66 mg/dL (ref 0–99)
NonHDL: 87.08
Total CHOL/HDL Ratio: 3
Triglycerides: 103 mg/dL (ref 0.0–149.0)
VLDL: 20.6 mg/dL (ref 0.0–40.0)

## 2015-08-10 LAB — HEMOGLOBIN A1C: Hgb A1c MFr Bld: 6.7 % — ABNORMAL HIGH (ref 4.6–6.5)

## 2015-08-24 ENCOUNTER — Ambulatory Visit (INDEPENDENT_AMBULATORY_CARE_PROVIDER_SITE_OTHER): Payer: Medicare Other | Admitting: *Deleted

## 2015-08-24 DIAGNOSIS — E538 Deficiency of other specified B group vitamins: Secondary | ICD-10-CM | POA: Diagnosis not present

## 2015-08-24 MED ORDER — CYANOCOBALAMIN 1000 MCG/ML IJ SOLN
1000.0000 ug | Freq: Once | INTRAMUSCULAR | Status: AC
  Administered 2015-08-24: 1000 ug via INTRAMUSCULAR

## 2015-08-24 NOTE — Progress Notes (Signed)
Pre visit review using our clinic review tool, if applicable. No additional management support is needed unless otherwise documented below in the visit note.  Pt tolerated injection well.   Pt would like next injection at 09/13/15 OV.  Dorrene German, RN

## 2015-08-29 ENCOUNTER — Other Ambulatory Visit: Payer: Self-pay | Admitting: Family Medicine

## 2015-08-30 ENCOUNTER — Other Ambulatory Visit: Payer: Self-pay | Admitting: Family Medicine

## 2015-09-10 ENCOUNTER — Telehealth: Payer: Self-pay

## 2015-09-13 ENCOUNTER — Encounter: Payer: Self-pay | Admitting: Family Medicine

## 2015-09-13 ENCOUNTER — Ambulatory Visit (INDEPENDENT_AMBULATORY_CARE_PROVIDER_SITE_OTHER): Payer: Medicare Other | Admitting: Family Medicine

## 2015-09-13 VITALS — BP 125/78 | HR 90 | Temp 97.8°F | Ht 66.0 in | Wt 165.0 lb

## 2015-09-13 DIAGNOSIS — Z23 Encounter for immunization: Secondary | ICD-10-CM | POA: Diagnosis not present

## 2015-09-13 DIAGNOSIS — E1151 Type 2 diabetes mellitus with diabetic peripheral angiopathy without gangrene: Secondary | ICD-10-CM

## 2015-09-13 DIAGNOSIS — E1165 Type 2 diabetes mellitus with hyperglycemia: Secondary | ICD-10-CM

## 2015-09-13 DIAGNOSIS — E538 Deficiency of other specified B group vitamins: Secondary | ICD-10-CM

## 2015-09-13 DIAGNOSIS — IMO0002 Reserved for concepts with insufficient information to code with codable children: Secondary | ICD-10-CM

## 2015-09-13 DIAGNOSIS — E785 Hyperlipidemia, unspecified: Secondary | ICD-10-CM

## 2015-09-13 DIAGNOSIS — Z0000 Encounter for general adult medical examination without abnormal findings: Secondary | ICD-10-CM

## 2015-09-13 DIAGNOSIS — I10 Essential (primary) hypertension: Secondary | ICD-10-CM

## 2015-09-13 MED ORDER — CYANOCOBALAMIN 1000 MCG/ML IJ SOLN
1000.0000 ug | Freq: Once | INTRAMUSCULAR | Status: AC
  Administered 2015-09-13: 1000 ug via INTRAMUSCULAR

## 2015-09-13 NOTE — Patient Instructions (Signed)
Preventive Care for Adults, Female A healthy lifestyle and preventive care can promote health and wellness. Preventive health guidelines for women include the following key practices.  A routine yearly physical is a good way to check with your health care provider about your health and preventive screening. It is a chance to share any concerns and updates on your health and to receive a thorough exam.  Visit your dentist for a routine exam and preventive care every 6 months. Brush your teeth twice a day and floss once a day. Good oral hygiene prevents tooth decay and gum disease.  The frequency of eye exams is based on your age, health, family medical history, use of contact lenses, and other factors. Follow your health care provider's recommendations for frequency of eye exams.  Eat a healthy diet. Foods like vegetables, fruits, whole grains, low-fat dairy products, and lean protein foods contain the nutrients you need without too many calories. Decrease your intake of foods high in solid fats, added sugars, and salt. Eat the right amount of calories for you.Get information about a proper diet from your health care provider, if necessary.  Regular physical exercise is one of the most important things you can do for your health. Most adults should get at least 150 minutes of moderate-intensity exercise (any activity that increases your heart rate and causes you to sweat) each week. In addition, most adults need muscle-strengthening exercises on 2 or more days a week.  Maintain a healthy weight. The body mass index (BMI) is a screening tool to identify possible weight problems. It provides an estimate of body fat based on height and weight. Your health care provider can find your BMI and can help you achieve or maintain a healthy weight.For adults 20 years and older:  A BMI below 18.5 is considered underweight.  A BMI of 18.5 to 24.9 is normal.  A BMI of 25 to 29.9 is considered overweight.  A  BMI of 30 and above is considered obese.  Maintain normal blood lipids and cholesterol levels by exercising and minimizing your intake of saturated fat. Eat a balanced diet with plenty of fruit and vegetables. Blood tests for lipids and cholesterol should begin at age 45 and be repeated every 5 years. If your lipid or cholesterol levels are high, you are over 50, or you are at high risk for heart disease, you may need your cholesterol levels checked more frequently.Ongoing high lipid and cholesterol levels should be treated with medicines if diet and exercise are not working.  If you smoke, find out from your health care provider how to quit. If you do not use tobacco, do not start.  Lung cancer screening is recommended for adults aged 45-80 years who are at high risk for developing lung cancer because of a history of smoking. A yearly low-dose CT scan of the lungs is recommended for people who have at least a 30-pack-year history of smoking and are a current smoker or have quit within the past 15 years. A pack year of smoking is smoking an average of 1 pack of cigarettes a day for 1 year (for example: 1 pack a day for 30 years or 2 packs a day for 15 years). Yearly screening should continue until the smoker has stopped smoking for at least 15 years. Yearly screening should be stopped for people who develop a health problem that would prevent them from having lung cancer treatment.  If you are pregnant, do not drink alcohol. If you are  breastfeeding, be very cautious about drinking alcohol. If you are not pregnant and choose to drink alcohol, do not have more than 1 drink per day. One drink is considered to be 12 ounces (355 mL) of beer, 5 ounces (148 mL) of wine, or 1.5 ounces (44 mL) of liquor.  Avoid use of street drugs. Do not share needles with anyone. Ask for help if you need support or instructions about stopping the use of drugs.  High blood pressure causes heart disease and increases the risk  of stroke. Your blood pressure should be checked at least every 1 to 2 years. Ongoing high blood pressure should be treated with medicines if weight loss and exercise do not work.  If you are 55-79 years old, ask your health care provider if you should take aspirin to prevent strokes.  Diabetes screening is done by taking a blood sample to check your blood glucose level after you have not eaten for a certain period of time (fasting). If you are not overweight and you do not have risk factors for diabetes, you should be screened once every 3 years starting at age 45. If you are overweight or obese and you are 40-70 years of age, you should be screened for diabetes every year as part of your cardiovascular risk assessment.  Breast cancer screening is essential preventive care for women. You should practice "breast self-awareness." This means understanding the normal appearance and feel of your breasts and may include breast self-examination. Any changes detected, no matter how small, should be reported to a health care provider. Women in their 20s and 30s should have a clinical breast exam (CBE) by a health care provider as part of a regular health exam every 1 to 3 years. After age 40, women should have a CBE every year. Starting at age 40, women should consider having a mammogram (breast X-ray test) every year. Women who have a family history of breast cancer should talk to their health care provider about genetic screening. Women at a high risk of breast cancer should talk to their health care providers about having an MRI and a mammogram every year.  Breast cancer gene (BRCA)-related cancer risk assessment is recommended for women who have family members with BRCA-related cancers. BRCA-related cancers include breast, ovarian, tubal, and peritoneal cancers. Having family members with these cancers may be associated with an increased risk for harmful changes (mutations) in the breast cancer genes BRCA1 and  BRCA2. Results of the assessment will determine the need for genetic counseling and BRCA1 and BRCA2 testing.  Your health care provider may recommend that you be screened regularly for cancer of the pelvic organs (ovaries, uterus, and vagina). This screening involves a pelvic examination, including checking for microscopic changes to the surface of your cervix (Pap test). You may be encouraged to have this screening done every 3 years, beginning at age 21.  For women ages 30-65, health care providers may recommend pelvic exams and Pap testing every 3 years, or they may recommend the Pap and pelvic exam, combined with testing for human papilloma virus (HPV), every 5 years. Some types of HPV increase your risk of cervical cancer. Testing for HPV may also be done on women of any age with unclear Pap test results.  Other health care providers may not recommend any screening for nonpregnant women who are considered low risk for pelvic cancer and who do not have symptoms. Ask your health care provider if a screening pelvic exam is right for   you.  If you have had past treatment for cervical cancer or a condition that could lead to cancer, you need Pap tests and screening for cancer for at least 20 years after your treatment. If Pap tests have been discontinued, your risk factors (such as having a new sexual partner) need to be reassessed to determine if screening should resume. Some women have medical problems that increase the chance of getting cervical cancer. In these cases, your health care provider may recommend more frequent screening and Pap tests.  Colorectal cancer can be detected and often prevented. Most routine colorectal cancer screening begins at the age of 50 years and continues through age 75 years. However, your health care provider may recommend screening at an earlier age if you have risk factors for colon cancer. On a yearly basis, your health care provider may provide home test kits to check  for hidden blood in the stool. Use of a small camera at the end of a tube, to directly examine the colon (sigmoidoscopy or colonoscopy), can detect the earliest forms of colorectal cancer. Talk to your health care provider about this at age 50, when routine screening begins. Direct exam of the colon should be repeated every 5-10 years through age 75 years, unless early forms of precancerous polyps or small growths are found.  People who are at an increased risk for hepatitis B should be screened for this virus. You are considered at high risk for hepatitis B if:  You were born in a country where hepatitis B occurs often. Talk with your health care provider about which countries are considered high risk.  Your parents were born in a high-risk country and you have not received a shot to protect against hepatitis B (hepatitis B vaccine).  You have HIV or AIDS.  You use needles to inject street drugs.  You live with, or have sex with, someone who has hepatitis B.  You get hemodialysis treatment.  You take certain medicines for conditions like cancer, organ transplantation, and autoimmune conditions.  Hepatitis C blood testing is recommended for all people born from 1945 through 1965 and any individual with known risks for hepatitis C.  Practice safe sex. Use condoms and avoid high-risk sexual practices to reduce the spread of sexually transmitted infections (STIs). STIs include gonorrhea, chlamydia, syphilis, trichomonas, herpes, HPV, and human immunodeficiency virus (HIV). Herpes, HIV, and HPV are viral illnesses that have no cure. They can result in disability, cancer, and death.  You should be screened for sexually transmitted illnesses (STIs) including gonorrhea and chlamydia if:  You are sexually active and are younger than 24 years.  You are older than 24 years and your health care provider tells you that you are at risk for this type of infection.  Your sexual activity has changed  since you were last screened and you are at an increased risk for chlamydia or gonorrhea. Ask your health care provider if you are at risk.  If you are at risk of being infected with HIV, it is recommended that you take a prescription medicine daily to prevent HIV infection. This is called preexposure prophylaxis (PrEP). You are considered at risk if:  You are sexually active and do not regularly use condoms or know the HIV status of your partner(s).  You take drugs by injection.  You are sexually active with a partner who has HIV.  Talk with your health care provider about whether you are at high risk of being infected with HIV. If   you choose to begin PrEP, you should first be tested for HIV. You should then be tested every 3 months for as long as you are taking PrEP.  Osteoporosis is a disease in which the bones lose minerals and strength with aging. This can result in serious bone fractures or breaks. The risk of osteoporosis can be identified using a bone density scan. Women ages 67 years and over and women at risk for fractures or osteoporosis should discuss screening with their health care providers. Ask your health care provider whether you should take a calcium supplement or vitamin D to reduce the rate of osteoporosis.  Menopause can be associated with physical symptoms and risks. Hormone replacement therapy is available to decrease symptoms and risks. You should talk to your health care provider about whether hormone replacement therapy is right for you.  Use sunscreen. Apply sunscreen liberally and repeatedly throughout the day. You should seek shade when your shadow is shorter than you. Protect yourself by wearing long sleeves, pants, a wide-brimmed hat, and sunglasses year round, whenever you are outdoors.  Once a month, do a whole body skin exam, using a mirror to look at the skin on your back. Tell your health care provider of new moles, moles that have irregular borders, moles that  are larger than a pencil eraser, or moles that have changed in shape or color.  Stay current with required vaccines (immunizations).  Influenza vaccine. All adults should be immunized every year.  Tetanus, diphtheria, and acellular pertussis (Td, Tdap) vaccine. Pregnant women should receive 1 dose of Tdap vaccine during each pregnancy. The dose should be obtained regardless of the length of time since the last dose. Immunization is preferred during the 27th-36th week of gestation. An adult who has not previously received Tdap or who does not know her vaccine status should receive 1 dose of Tdap. This initial dose should be followed by tetanus and diphtheria toxoids (Td) booster doses every 10 years. Adults with an unknown or incomplete history of completing a 3-dose immunization series with Td-containing vaccines should begin or complete a primary immunization series including a Tdap dose. Adults should receive a Td booster every 10 years.  Varicella vaccine. An adult without evidence of immunity to varicella should receive 2 doses or a second dose if she has previously received 1 dose. Pregnant females who do not have evidence of immunity should receive the first dose after pregnancy. This first dose should be obtained before leaving the health care facility. The second dose should be obtained 4-8 weeks after the first dose.  Human papillomavirus (HPV) vaccine. Females aged 13-26 years who have not received the vaccine previously should obtain the 3-dose series. The vaccine is not recommended for use in pregnant females. However, pregnancy testing is not needed before receiving a dose. If a female is found to be pregnant after receiving a dose, no treatment is needed. In that case, the remaining doses should be delayed until after the pregnancy. Immunization is recommended for any person with an immunocompromised condition through the age of 61 years if she did not get any or all doses earlier. During the  3-dose series, the second dose should be obtained 4-8 weeks after the first dose. The third dose should be obtained 24 weeks after the first dose and 16 weeks after the second dose.  Zoster vaccine. One dose is recommended for adults aged 30 years or older unless certain conditions are present.  Measles, mumps, and rubella (MMR) vaccine. Adults born  before 1957 generally are considered immune to measles and mumps. Adults born in 1957 or later should have 1 or more doses of MMR vaccine unless there is a contraindication to the vaccine or there is laboratory evidence of immunity to each of the three diseases. A routine second dose of MMR vaccine should be obtained at least 28 days after the first dose for students attending postsecondary schools, health care workers, or international travelers. People who received inactivated measles vaccine or an unknown type of measles vaccine during 1963-1967 should receive 2 doses of MMR vaccine. People who received inactivated mumps vaccine or an unknown type of mumps vaccine before 1979 and are at high risk for mumps infection should consider immunization with 2 doses of MMR vaccine. For females of childbearing age, rubella immunity should be determined. If there is no evidence of immunity, females who are not pregnant should be vaccinated. If there is no evidence of immunity, females who are pregnant should delay immunization until after pregnancy. Unvaccinated health care workers born before 1957 who lack laboratory evidence of measles, mumps, or rubella immunity or laboratory confirmation of disease should consider measles and mumps immunization with 2 doses of MMR vaccine or rubella immunization with 1 dose of MMR vaccine.  Pneumococcal 13-valent conjugate (PCV13) vaccine. When indicated, a person who is uncertain of his immunization history and has no record of immunization should receive the PCV13 vaccine. All adults 65 years of age and older should receive this  vaccine. An adult aged 19 years or older who has certain medical conditions and has not been previously immunized should receive 1 dose of PCV13 vaccine. This PCV13 should be followed with a dose of pneumococcal polysaccharide (PPSV23) vaccine. Adults who are at high risk for pneumococcal disease should obtain the PPSV23 vaccine at least 8 weeks after the dose of PCV13 vaccine. Adults older than 65 years of age who have normal immune system function should obtain the PPSV23 vaccine dose at least 1 year after the dose of PCV13 vaccine.  Pneumococcal polysaccharide (PPSV23) vaccine. When PCV13 is also indicated, PCV13 should be obtained first. All adults aged 65 years and older should be immunized. An adult younger than age 65 years who has certain medical conditions should be immunized. Any person who resides in a nursing home or long-term care facility should be immunized. An adult smoker should be immunized. People with an immunocompromised condition and certain other conditions should receive both PCV13 and PPSV23 vaccines. People with human immunodeficiency virus (HIV) infection should be immunized as soon as possible after diagnosis. Immunization during chemotherapy or radiation therapy should be avoided. Routine use of PPSV23 vaccine is not recommended for American Indians, [place] Natives, or people younger than 65 years unless there are medical conditions that require PPSV23 vaccine. When indicated, people who have unknown immunization and have no record of immunization should receive PPSV23 vaccine. One-time revaccination 5 years after the first dose of PPSV23 is recommended for people aged 19-64 years who have chronic kidney failure, nephrotic syndrome, asplenia, or immunocompromised conditions. People who received 1-2 doses of PPSV23 before age 65 years should receive another dose of PPSV23 vaccine at age 65 years or later if at least 5 years have passed since the previous dose. Doses of PPSV23 are not  needed for people immunized with PPSV23 at or after age 65 years.  Meningococcal vaccine. Adults with asplenia or persistent complement component deficiencies should receive 2 doses of quadrivalent meningococcal conjugate (MenACWY-D) vaccine. The doses should be obtained   at least 2 months apart. Microbiologists working with certain meningococcal bacteria, [place] recruits, people at risk during an outbreak, and people who travel to or live in countries with a high rate of meningitis should be immunized. A first-year college student up through age 34 years who is living in a residence hall should receive a dose if she did not receive a dose on or after her 16th birthday. Adults who have certain high-risk conditions should receive one or more doses of vaccine.  Hepatitis A vaccine. Adults who wish to be protected from this disease, have certain high-risk conditions, work with hepatitis A-infected animals, work in hepatitis A research labs, or travel to or work in countries with a high rate of hepatitis A should be immunized. Adults who were previously unvaccinated and who anticipate close contact with an international adoptee during the first 60 days after arrival in the [place] from a country with a high rate of hepatitis A should be immunized.  Hepatitis B vaccine. Adults who wish to be protected from this disease, have certain high-risk conditions, may be exposed to blood or other infectious body fluids, are household contacts or sex partners of hepatitis B positive people, are clients or workers in certain care facilities, or travel to or work in countries with a high rate of hepatitis B should be immunized.  Haemophilus influenzae type b (Hib) vaccine. A previously unvaccinated person with asplenia or sickle cell disease or having a scheduled splenectomy should receive 1 dose of Hib vaccine. Regardless of previous immunization, a recipient of a hematopoietic stem cell transplant should receive a  3-dose series 6-12 months after her successful transplant. Hib vaccine is not recommended for adults with HIV infection. Preventive Services / Frequency Ages 35 to 4 years  Blood pressure check.** / Every 3-5 years.  Lipid and cholesterol check.** / Every 5 years beginning at age 60.  Clinical breast exam.** / Every 3 years for women in their 71s and 10s.  BRCA-related cancer risk assessment.** / For women who have family members with a BRCA-related cancer (breast, ovarian, tubal, or peritoneal cancers).  Pap test.** / Every 2 years from ages 76 through 26. Every 3 years starting at age 61 through age 76 or 93 with a history of 3 consecutive normal Pap tests.  HPV screening.** / Every 3 years from ages 37 through ages 60 to 51 with a history of 3 consecutive normal Pap tests.  Hepatitis C blood test.** / For any individual with known risks for hepatitis C.  Skin self-exam. / Monthly.  Influenza vaccine. / Every year.  Tetanus, diphtheria, and acellular pertussis (Tdap, Td) vaccine.** / Consult your health care provider. Pregnant women should receive 1 dose of Tdap vaccine during each pregnancy. 1 dose of Td every 10 years.  Varicella vaccine.** / Consult your health care provider. Pregnant females who do not have evidence of immunity should receive the first dose after pregnancy.  HPV vaccine. / 3 doses over 6 months, if 93 and younger. The vaccine is not recommended for use in pregnant females. However, pregnancy testing is not needed before receiving a dose.  Measles, mumps, rubella (MMR) vaccine.** / You need at least 1 dose of MMR if you were born in 1957 or later. You may also need a 2nd dose. For females of childbearing age, rubella immunity should be determined. If there is no evidence of immunity, females who are not pregnant should be vaccinated. If there is no evidence of immunity, females who are  pregnant should delay immunization until after pregnancy.  Pneumococcal  13-valent conjugate (PCV13) vaccine.** / Consult your health care provider.  Pneumococcal polysaccharide (PPSV23) vaccine.** / 1 to 2 doses if you smoke cigarettes or if you have certain conditions.  Meningococcal vaccine.** / 1 dose if you are age 68 to 8 years and a Market researcher living in a residence hall, or have one of several medical conditions, you need to get vaccinated against meningococcal disease. You may also need additional booster doses.  Hepatitis A vaccine.** / Consult your health care provider.  Hepatitis B vaccine.** / Consult your health care provider.  Haemophilus influenzae type b (Hib) vaccine.** / Consult your health care provider. Ages 7 to 53 years  Blood pressure check.** / Every year.  Lipid and cholesterol check.** / Every 5 years beginning at age 25 years.  Lung cancer screening. / Every year if you are aged 11-80 years and have a 30-pack-year history of smoking and currently smoke or have quit within the past 15 years. Yearly screening is stopped once you have quit smoking for at least 15 years or develop a health problem that would prevent you from having lung cancer treatment.  Clinical breast exam.** / Every year after age 48 years.  BRCA-related cancer risk assessment.** / For women who have family members with a BRCA-related cancer (breast, ovarian, tubal, or peritoneal cancers).  Mammogram.** / Every year beginning at age 41 years and continuing for as long as you are in good health. Consult with your health care provider.  Pap test.** / Every 3 years starting at age 65 years through age 37 or 70 years with a history of 3 consecutive normal Pap tests.  HPV screening.** / Every 3 years from ages 72 years through ages 60 to 40 years with a history of 3 consecutive normal Pap tests.  Fecal occult blood test (FOBT) of stool. / Every year beginning at age 21 years and continuing until age 5 years. You may not need to do this test if you get  a colonoscopy every 10 years.  Flexible sigmoidoscopy or colonoscopy.** / Every 5 years for a flexible sigmoidoscopy or every 10 years for a colonoscopy beginning at age 35 years and continuing until age 48 years.  Hepatitis C blood test.** / For all people born from [birth year] through 1965 and any individual with known risks for hepatitis C.  Skin self-exam. / Monthly.  Influenza vaccine. / Every year.  Tetanus, diphtheria, and acellular pertussis (Tdap/Td) vaccine.** / Consult your health care provider. Pregnant women should receive 1 dose of Tdap vaccine during each pregnancy. 1 dose of Td every 10 years.  Varicella vaccine.** / Consult your health care provider. Pregnant females who do not have evidence of immunity should receive the first dose after pregnancy.  Zoster vaccine.** / 1 dose for adults aged 30 years or older.  Measles, mumps, rubella (MMR) vaccine.** / You need at least 1 dose of MMR if you were born in 1957 or later. You may also need a second dose. For females of childbearing age, rubella immunity should be determined. If there is no evidence of immunity, females who are not pregnant should be vaccinated. If there is no evidence of immunity, females who are pregnant should delay immunization until after pregnancy.  Pneumococcal 13-valent conjugate (PCV13) vaccine.** / Consult your health care provider.  Pneumococcal polysaccharide (PPSV23) vaccine.** / 1 to 2 doses if you smoke cigarettes or if you have certain conditions.  Meningococcal vaccine.** /  Consult your health care provider.  Hepatitis A vaccine.** / Consult your health care provider.  Hepatitis B vaccine.** / Consult your health care provider.  Haemophilus influenzae type b (Hib) vaccine.** / Consult your health care provider. Ages 64 years and over  Blood pressure check.** / Every year.  Lipid and cholesterol check.** / Every 5 years beginning at age 23 years.  Lung cancer screening. / Every year if you  are aged 16-80 years and have a 30-pack-year history of smoking and currently smoke or have quit within the past 15 years. Yearly screening is stopped once you have quit smoking for at least 15 years or develop a health problem that would prevent you from having lung cancer treatment.  Clinical breast exam.** / Every year after age 74 years.  BRCA-related cancer risk assessment.** / For women who have family members with a BRCA-related cancer (breast, ovarian, tubal, or peritoneal cancers).  Mammogram.** / Every year beginning at age 44 years and continuing for as long as you are in good health. Consult with your health care provider.  Pap test.** / Every 3 years starting at age 58 years through age 22 or 39 years with 3 consecutive normal Pap tests. Testing can be stopped between 65 and 70 years with 3 consecutive normal Pap tests and no abnormal Pap or HPV tests in the past 10 years.  HPV screening.** / Every 3 years from ages 64 years through ages 70 or 61 years with a history of 3 consecutive normal Pap tests. Testing can be stopped between 65 and 70 years with 3 consecutive normal Pap tests and no abnormal Pap or HPV tests in the past 10 years.  Fecal occult blood test (FOBT) of stool. / Every year beginning at age 40 years and continuing until age 27 years. You may not need to do this test if you get a colonoscopy every 10 years.  Flexible sigmoidoscopy or colonoscopy.** / Every 5 years for a flexible sigmoidoscopy or every 10 years for a colonoscopy beginning at age 7 years and continuing until age 32 years.  Hepatitis C blood test.** / For all people born from [birth year] through 1965 and any individual with known risks for hepatitis C.  Osteoporosis screening.** / A one-time screening for women ages 30 years and over and women at risk for fractures or osteoporosis.  Skin self-exam. / Monthly.  Influenza vaccine. / Every year.  Tetanus, diphtheria, and acellular pertussis (Tdap/Td)  vaccine.** / 1 dose of Td every 10 years.  Varicella vaccine.** / Consult your health care provider.  Zoster vaccine.** / 1 dose for adults aged 35 years or older.  Pneumococcal 13-valent conjugate (PCV13) vaccine.** / Consult your health care provider.  Pneumococcal polysaccharide (PPSV23) vaccine.** / 1 dose for all adults aged 46 years and older.  Meningococcal vaccine.** / Consult your health care provider.  Hepatitis A vaccine.** / Consult your health care provider.  Hepatitis B vaccine.** / Consult your health care provider.  Haemophilus influenzae type b (Hib) vaccine.** / Consult your health care provider. ** Family history and personal history of risk and conditions may change your health care provider's recommendations.   This information is not intended to replace advice given to you by your health care provider. Make sure you discuss any questions you have with your health care provider.   Document Released: 08/08/2001 Document Revised: 07/03/2014 Document Reviewed: 11/07/2010 Elsevier Interactive Patient Education [employer].

## 2015-09-13 NOTE — Progress Notes (Signed)
Subjective:    Alyssa Ford is a 65 y.o. female who presents for a Welcome to Medicare exam.   Review of Systems  Review of Systems  Constitutional: Negative for activity change, appetite change and fatigue.  HENT: Negative for hearing loss, congestion, tinnitus and ear discharge.   Eyes: Negative for visual disturbance (see optho q1y -- vision corrected to 20/20 with glasses).  Respiratory: Negative for cough, chest tightness and shortness of breath.   Cardiovascular: Negative for chest pain, palpitations and leg swelling.  Gastrointestinal: Negative for abdominal pain, diarrhea, constipation and abdominal distention.  Genitourinary: Negative for urgency, frequency, decreased urine volume and difficulty urinating.  Musculoskeletal: Negative for back pain, arthralgias and gait problem.  Skin: Negative for color change, pallor and rash.  Neurological: Negative for dizziness, light-headedness, numbness and headaches.  Hematological: Negative for adenopathy. Does not bruise/bleed easily.  Psychiatric/Behavioral: Negative for suicidal ideas, confusion, sleep disturbance, self-injury, dysphoric mood, decreased concentration and agitation.  Pt is able to read and write and can do all ADLs No risk for falling No abuse/ violence in home     Cardiac Risk Factors include: diabetes mellitus;advanced age (>41men, >34 women);dyslipidemia;hypertension      Objective:    Today's Vitals   09/13/15 1331  BP: 125/78  Pulse: 90  Temp: 97.8 F (36.6 C)  TempSrc: Oral  Height: 5\' 6"  (1.676 m)  Weight: 165 lb (74.844 kg)  SpO2: 98%  Body mass index is 26.64 kg/(m^2). BP 125/78 mmHg  Pulse 90  Temp(Src) 97.8 F (36.6 C) (Oral)  Ht 5\' 6"  (1.676 m)  Wt 165 lb (74.844 kg)  BMI 26.64 kg/m2  SpO2 98% General appearance: alert, cooperative, appears stated age and no distress Head: Normocephalic, without obvious abnormality, atraumatic Eyes: conjunctivae/corneas clear. PERRL, EOM's  intact. Fundi benign. Ears: normal TM's and external ear canals both ears Nose: Nares normal. Septum midline. Mucosa normal. No drainage or sinus tenderness. Throat: lips, mucosa, and tongue normal; teeth and gums normal Neck: no adenopathy, no carotid bruit, no JVD, supple, symmetrical, trachea midline and thyroid not enlarged, symmetric, no tenderness/mass/nodules Back: symmetric, no curvature. ROM normal. No CVA tenderness. Lungs: clear to auscultation bilaterally Breasts: normal appearance, no masses or tenderness Heart: S1, S2 normal Abdomen: soft, non-tender; bowel sounds normal; no masses,  no organomegaly Pelvic: not indicated; post-menopausal, no abnormal Pap smears in past Extremities: extremities normal, atraumatic, no cyanosis or edema Pulses: 2+ and symmetric Skin: Skin color, texture, turgor normal. No rashes or lesions Lymph nodes: Cervical, supraclavicular, and axillary nodes normal. Neurologic: Alert and oriented X 3, normal strength and tone. Normal symmetric reflexes. Normal coordination and gait Psych- no depression, no anxiety  Medications Outpatient Encounter Prescriptions as of 09/13/2015  Medication Sig  . amitriptyline (ELAVIL) 25 MG tablet TAKE TWO TABLETS BY MOUTH ONCE DAILY  . atorvastatin (LIPITOR) 80 MG tablet TAKE ONE TABLET BY MOUTH ONCE DAILY  . BIOTIN PO Take 1 tablet by mouth daily.   Marland Kitchen buPROPion (WELLBUTRIN XL) 150 MG 24 hr tablet TAKE TWO TABLETS BY MOUTH IN THE MORNING  . diazepam (VALIUM) 5 MG tablet TAKE ONE TABLET BY MOUTH AT BEDTIME AS NEEDED FOR ANXIETY  . glimepiride (AMARYL) 4 MG tablet TAKE ONE TABLET BY MOUTH ONCE DAILY BEFORE  BREAKFAST  . hydrochlorothiazide (HYDRODIURIL) 25 MG tablet TAKE ONE TABLET BY MOUTH ONCE DAILY  . latanoprost (XALATAN) 0.005 % ophthalmic solution   . metFORMIN (GLUCOPHAGE) 1000 MG tablet TAKE ONE TABLET BY MOUTH TWICE DAILY  WITH A MEAL  . Naproxen Sodium (ALEVE PO) Take 2 tablets by mouth daily.  . NON FORMULARY  Apply 1 drop topically 2 (two) times a week. Reported on 09/10/2015  . omeprazole (PRILOSEC) 20 MG capsule Take 20 mg by mouth daily.    . [EXPIRED] cyanocobalamin ((VITAMIN B-12)) injection 1,000 mcg    No facility-administered encounter medications on file as of 09/13/2015.     History: Past Medical History  Diagnosis Date  . Diabetes mellitus   . Hyperlipidemia   . Hypertension   . GERD (gastroesophageal reflux disease)   . Thyroid nodule   . Cataract   . Glaucoma   . Anemia   . Diverticulosis of colon (without mention of hemorrhage) 2003, 2008    Colonoscopy  . Hiatal hernia 2003    EGD   . Arthritis   . Chronic headaches    Past Surgical History  Procedure Laterality Date  . Cholecystectomy    . Carpal tunnel release  2006  . Carpal tunnel release  12/24/2007  . Cervical fusion  3/08    Family History  Problem Relation Age of Onset  . Dementia Mother   . Breast cancer Mother 52  . Kidney failure Mother 49    died from kidney failure from abx given to her for pneumonia  . Hyperlipidemia Mother   . Hypertension Mother   . Diabetes Mother   . Cancer Mother 54    breast  . Parkinsonism Father   . Diabetes Father 29  . Hypertension Sister   . Diabetes Brother   . Hypertension Brother   . Colon cancer Neg Hx   . Heart disease Cousin 15    mi   Social History   Occupational History  . retired    Social History Main Topics  . Smoking status: Former Smoker -- 1.40 packs/day for 49 years    Types: Cigarettes    Quit date: 07/16/2015  . Smokeless tobacco: Current User     Comment: e cig  . Alcohol Use: 0.0 oz/week    0 Standard drinks or equivalent per week     Comment: occ  . Drug Use: No  . Sexual Activity:    Partners: Male    Tobacco Counseling Ready to quit: Not Answered Counseling given: Not Answered   Immunizations and Health Maintenance Immunization History  Administered Date(s) Administered  . Influenza Split 04/13/2011  . Influenza  Whole 04/17/2006  . Influenza, Seasonal, Injecte, Preservative Fre 05/22/2012  . Influenza,inj,Quad PF,36+ Mos 03/03/2014, 03/18/2015  . Pneumococcal Conjugate-13 09/13/2015  . Pneumococcal Polysaccharide-23 03/31/2003, 11/03/2008  . Td 10/24/1999, 02/23/2010  . Zoster 12/25/2011   Health Maintenance Due  Topic Date Due  . HIV Screening  03/19/1965  . URINE MICROALBUMIN  08/04/2015    Activities of Daily Living In your present state of health, do you have any difficulty performing the following activities: 09/13/2015  Hearing? N  Vision? Y  Difficulty concentrating or making decisions? N  Walking or climbing stairs? N  Dressing or bathing? N  Doing errands, shopping? N  Preparing Food and eating ? N  Using the Toilet? N  In the past six months, have you accidently leaked urine? N  Do you have problems with loss of bowel control? N  Managing your Medications? N  Managing your Finances? N  Housekeeping or managing your Housekeeping? N    Advanced Directives: Does patient have an advance directive?: No Would patient like information on creating an advanced directive?:  No - patient declined information    Assessment:    This is a routine wellness examination for this patient .   Vision/Hearing screen Hearing Screening Comments: Hear--  Whisper normal Vision Screening Comments: Per Dr Bing Plume  Dietary issues and exercise activities discussed:  Current Exercise Habits: Home exercise routine, Type of exercise: walking, Time (Minutes): > 60, Frequency (Times/Week): 2, Weekly Exercise (Minutes/Week): 0, Intensity: Moderate, Exercise limited by: orthopedic condition(s)  Goals    None     Depression Screen PHQ 2/9 Scores 09/13/2015 09/13/2015 05/22/2012  PHQ - 2 Score 0 0 0     Fall Risk Fall Risk  09/13/2015  Falls in the past year? No    MMSE: No flowsheet data found.  Patient Care Team: Rosalita Chessman, DO as PCP - General Devra Dopp, MD as Referring  Physician (Dermatology)     Plan:     During the course of the visit the patient was educated and counseled about the following appropriate screening and preventive services:   Vaccines to include Pneumoccal, Influenza, Hepatitis B, Td, Zostavax, HCV  Electrocardiogram  Cardiovascular Disease  Colorectal cancer screening  Bone density screening  Diabetes screening  Glaucoma screening  Mammography/PAP  Nutrition counseling  Her current medications and allergies were reviewed and needed refills of her chronic medications were ordered. The plan for yearly health maintenance was discussed and all orders and referrals were made as appropriate.  Patient Instructions (the written plan) was given to the patient.  1. Need for pneumococcal vaccination   - Pneumococcal conjugate vaccine 13-valent  2. B12 deficiency   - cyanocobalamin ((VITAMIN B-12)) injection 1,000 mcg; Inject 1 mL (1,000 mcg total) into the muscle once. - Vitamin B12; Future  3. Welcome to Medicare preventive visit   - EKG 12-Lead  4. Preventative health care    5. Hyperlipidemia   - POCT urinalysis dipstick; Future - Microalbumin / creatinine urine ratio; Future - Lipid panel; Future - Hemoglobin A1c; Future - CBC with Differential/Platelet; Future - Basic metabolic panel; Future  6. Essential hypertension con't hctz stable - POCT urinalysis dipstick; Future - Microalbumin / creatinine urine ratio; Future - Lipid panel; Future - Hemoglobin A1c; Future - CBC with Differential/Platelet; Future - Basic metabolic panel; Future  7. Routine history and physical examination of adult See above   Garnet Koyanagi, DO 09/15/2015

## 2015-09-14 ENCOUNTER — Other Ambulatory Visit: Payer: Self-pay | Admitting: Acute Care

## 2015-09-14 DIAGNOSIS — Z87891 Personal history of nicotine dependence: Secondary | ICD-10-CM

## 2015-09-15 NOTE — Assessment & Plan Note (Signed)
glimepride Metformin con't meds Check labs

## 2015-09-15 NOTE — Assessment & Plan Note (Signed)
Check labs con't lipitor  

## 2015-09-21 ENCOUNTER — Telehealth: Payer: Self-pay | Admitting: Family Medicine

## 2015-09-21 ENCOUNTER — Other Ambulatory Visit (INDEPENDENT_AMBULATORY_CARE_PROVIDER_SITE_OTHER): Payer: Medicare Other

## 2015-09-21 DIAGNOSIS — E1151 Type 2 diabetes mellitus with diabetic peripheral angiopathy without gangrene: Secondary | ICD-10-CM | POA: Diagnosis not present

## 2015-09-21 DIAGNOSIS — I10 Essential (primary) hypertension: Secondary | ICD-10-CM | POA: Diagnosis not present

## 2015-09-21 DIAGNOSIS — E1165 Type 2 diabetes mellitus with hyperglycemia: Secondary | ICD-10-CM | POA: Diagnosis not present

## 2015-09-21 DIAGNOSIS — E538 Deficiency of other specified B group vitamins: Secondary | ICD-10-CM | POA: Diagnosis not present

## 2015-09-21 DIAGNOSIS — E785 Hyperlipidemia, unspecified: Secondary | ICD-10-CM | POA: Diagnosis not present

## 2015-09-21 LAB — BASIC METABOLIC PANEL
BUN: 17 mg/dL (ref 6–23)
CO2: 29 mEq/L (ref 19–32)
Calcium: 9.1 mg/dL (ref 8.4–10.5)
Chloride: 97 mEq/L (ref 96–112)
Creatinine, Ser: 0.79 mg/dL (ref 0.40–1.20)
GFR: 77.51 mL/min (ref >60.00)
Glucose, Bld: 117 mg/dL — ABNORMAL HIGH (ref 70–99)
Potassium: 3.6 mEq/L (ref 3.5–5.1)
Sodium: 139 mEq/L (ref 135–145)

## 2015-09-21 LAB — HEMOGLOBIN A1C: Hgb A1c MFr Bld: 7.3 % — ABNORMAL HIGH (ref 4.6–6.5)

## 2015-09-21 LAB — MICROALBUMIN / CREATININE URINE RATIO
Creatinine,U: 37.2 mg/dL
Microalb Creat Ratio: 2.4 mg/g (ref 0.0–30.0)
Microalb, Ur: 0.9 mg/dL (ref 0.0–1.9)

## 2015-09-21 LAB — POCT URINALYSIS DIPSTICK
Bilirubin, UA: NEGATIVE
Blood, UA: NEGATIVE
Glucose, UA: NEGATIVE
Ketones, UA: NEGATIVE
Leukocytes, UA: NEGATIVE
Nitrite, UA: NEGATIVE
Protein, UA: NEGATIVE
Spec Grav, UA: 1.015
Urobilinogen, UA: 0.2
pH, UA: 7

## 2015-09-21 LAB — CBC WITH DIFFERENTIAL/PLATELET
Basophils Absolute: 0.1 10*3/uL (ref 0.0–0.1)
Basophils Relative: 0.6 % (ref 0.0–3.0)
Eosinophils Absolute: 0.2 10*3/uL (ref 0.0–0.7)
Eosinophils Relative: 1.9 % (ref 0.0–5.0)
HCT: 33.5 % — ABNORMAL LOW (ref 36.0–46.0)
Hemoglobin: 10.7 g/dL — ABNORMAL LOW (ref 12.0–15.0)
Lymphocytes Relative: 30.8 % (ref 12.0–46.0)
Lymphs Abs: 3.1 10*3/uL (ref 0.7–4.0)
MCHC: 31.9 g/dL (ref 30.0–36.0)
MCV: 76.4 fl — ABNORMAL LOW (ref 78.0–100.0)
Monocytes Absolute: 0.7 10*3/uL (ref 0.1–1.0)
Monocytes Relative: 6.8 % (ref 3.0–12.0)
Neutro Abs: 6 10*3/uL (ref 1.4–7.7)
Neutrophils Relative %: 59.9 % (ref 43.0–77.0)
Platelets: 331 10*3/uL (ref 150.0–400.0)
RBC: 4.38 Mil/uL (ref 3.87–5.11)
RDW: 18.6 % — ABNORMAL HIGH (ref 11.5–15.5)
WBC: 10.1 10*3/uL (ref 4.0–10.5)

## 2015-09-21 LAB — LIPID PANEL
Cholesterol: 152 mg/dL (ref 0–200)
HDL: 47.1 mg/dL (ref >39.00)
LDL Cholesterol: 79 mg/dL (ref 0–99)
NonHDL: 105.02
Total CHOL/HDL Ratio: 3
Triglycerides: 130 mg/dL (ref 0.0–149.0)
VLDL: 26 mg/dL (ref 0.0–40.0)

## 2015-09-21 LAB — VITAMIN B12: Vitamin B-12: 453 pg/mL (ref 211–911)

## 2015-09-21 MED ORDER — GLIMEPIRIDE 4 MG PO TABS: ORAL_TABLET | ORAL | Status: DC

## 2015-09-21 NOTE — Telephone Encounter (Signed)
Rx faxed.    KP 

## 2015-09-21 NOTE — Telephone Encounter (Signed)
Caller name:Alexica Relationship to patient: Can be [phone] Pharmacy:[facility]   Reason for call:Refill glimepiride 4 mg patient is out  Needs 90 days takes 1 per day

## 2015-09-23 ENCOUNTER — Other Ambulatory Visit: Payer: Self-pay

## 2015-09-23 DIAGNOSIS — Z1231 Encounter for screening mammogram for malignant neoplasm of breast: Secondary | ICD-10-CM

## 2015-09-24 ENCOUNTER — Encounter: Payer: Self-pay | Admitting: Podiatry

## 2015-09-24 ENCOUNTER — Ambulatory Visit (INDEPENDENT_AMBULATORY_CARE_PROVIDER_SITE_OTHER): Payer: Medicare Other | Admitting: Podiatry

## 2015-09-24 DIAGNOSIS — R234 Changes in skin texture: Secondary | ICD-10-CM | POA: Diagnosis not present

## 2015-09-24 DIAGNOSIS — E119 Type 2 diabetes mellitus without complications: Secondary | ICD-10-CM

## 2015-09-24 DIAGNOSIS — B351 Tinea unguium: Secondary | ICD-10-CM | POA: Diagnosis not present

## 2015-09-24 DIAGNOSIS — M79676 Pain in unspecified toe(s): Secondary | ICD-10-CM | POA: Diagnosis not present

## 2015-09-24 NOTE — Progress Notes (Signed)
Quick Note:  Pt has seen results on MyChart and message also sent for patient to call back if any questions. ______ 

## 2015-09-24 NOTE — Progress Notes (Signed)
Patient ID: Alyssa Ford, female   DOB: 09-Apr-1951, 65 y.o.   MRN: [id number]  Subjective: 64y.o.-year-old female returns the office today for painful, elongated, thickened toenails which she is unable to trim herself. Denies any redness or drainage around the nails. States that she had a crack in the back of her right heel which she has been using antibiotic ointment on. She denies any drainage or possibly swelling or redness. Denies any acute changes since last appointment and no new complaints today. Denies any systemic complaints such as fevers, chills, nausea, vomiting.   Objective: AAO 3, NAD DP/PT pulses palpable, CRT less than 3 seconds Protective sensation intact with Simms Weinstein monofilament, Achilles tendon reflex intact.  Nails hypertrophic, dystrophic, elongated, brittle, discolored 4. There is tenderness overlying the nails of bilateral hallux and 5th digit. There is no surrounding erythema or drainage along the nail sites. Superficial-appearing skin fissure to the posterior aspect of the right heel. There is no underlying ulceration, drainage. There is no swelling redness or red streaks or any swelling. This appears to be superficial and not open.  No open lesions or pre-ulcerative lesions are identified. No other areas of tenderness bilateral lower extremities. No overlying edema, erythema, increased warmth. Adductovarus of bilateral 5th digits.  No pain with calf compression, swelling, warmth, erythema.  Assessment: Patient presents with symptomatic onychomycosis x4; skin fissure  Plan: -Treatment options including alternatives, risks, complications were discussed -Nails sharply debrided without complication/bleeding. -Skin fissure right heel debrided without complications or bleeding. -Discussed daily foot inspection. If there are any changes, to call the office immediately.  -Follow-up in 3 months or sooner if any problems are to arise. In the meantime, encouraged to  call the office with any questions, concerns, changes symptoms.  Celesta Gentile, DPM

## 2015-09-27 ENCOUNTER — Other Ambulatory Visit: Payer: Self-pay

## 2015-09-27 ENCOUNTER — Encounter: Payer: Self-pay | Admitting: Family Medicine

## 2015-09-27 MED ORDER — GLIMEPIRIDE 4 MG PO TABS: 4.0000 mg | ORAL_TABLET | Freq: Two times a day (BID) | ORAL | Status: DC

## 2015-09-28 ENCOUNTER — Other Ambulatory Visit: Payer: Self-pay | Admitting: Family Medicine

## 2015-09-28 NOTE — Telephone Encounter (Signed)
Medication filled to pharmacy as requested.   

## 2015-10-10 ENCOUNTER — Other Ambulatory Visit: Payer: Self-pay | Admitting: Family Medicine

## 2015-10-12 ENCOUNTER — Ambulatory Visit
Admission: RE | Admit: 2015-10-12 | Discharge: 2015-10-12 | Disposition: A | Discharge status: Home / Self Care | Payer: Medicare Other | Source: Ambulatory Visit

## 2015-10-12 DIAGNOSIS — Z1231 Encounter for screening mammogram for malignant neoplasm of breast: Secondary | ICD-10-CM

## 2015-10-14 ENCOUNTER — Ambulatory Visit (INDEPENDENT_AMBULATORY_CARE_PROVIDER_SITE_OTHER): Payer: Medicare Other | Admitting: *Deleted

## 2015-10-14 DIAGNOSIS — E538 Deficiency of other specified B group vitamins: Secondary | ICD-10-CM

## 2015-10-14 MED ORDER — CYANOCOBALAMIN 1000 MCG/ML IJ SOLN
1000.0000 ug | Freq: Once | INTRAMUSCULAR | Status: AC
  Administered 2015-10-14: 1000 ug via INTRAMUSCULAR

## 2015-10-14 NOTE — Progress Notes (Signed)
Pre visit review using our clinic review tool, if applicable. No additional management support is needed unless otherwise documented below in the visit note.  Pt tolerated injection well.   Next appt: 11/17/15  Dorrene German, RN

## 2015-10-30 ENCOUNTER — Other Ambulatory Visit: Payer: Self-pay | Admitting: Family Medicine

## 2015-11-17 ENCOUNTER — Ambulatory Visit (INDEPENDENT_AMBULATORY_CARE_PROVIDER_SITE_OTHER): Payer: Medicare Other | Admitting: *Deleted

## 2015-11-17 DIAGNOSIS — E538 Deficiency of other specified B group vitamins: Secondary | ICD-10-CM | POA: Diagnosis not present

## 2015-11-17 MED ORDER — CYANOCOBALAMIN 1000 MCG/ML IJ SOLN
1000.0000 ug | Freq: Once | INTRAMUSCULAR | Status: AC
  Administered 2015-11-17: 1000 ug via INTRAMUSCULAR

## 2015-11-17 NOTE — Progress Notes (Signed)
Pre visit review using our clinic review tool, if applicable. No additional management support is needed unless otherwise documented below in the visit note.  Pt tolerated injection well.   Next appt: 12/21/15  Dorrene German, RN

## 2015-11-26 NOTE — Telephone Encounter (Signed)
Pre Visit Call. 

## 2015-12-09 ENCOUNTER — Encounter: Payer: Self-pay | Admitting: Family Medicine

## 2015-12-09 DIAGNOSIS — E785 Hyperlipidemia, unspecified: Secondary | ICD-10-CM

## 2015-12-09 DIAGNOSIS — E119 Type 2 diabetes mellitus without complications: Secondary | ICD-10-CM

## 2015-12-21 ENCOUNTER — Ambulatory Visit: Payer: Medicare Other

## 2015-12-23 ENCOUNTER — Ambulatory Visit (INDEPENDENT_AMBULATORY_CARE_PROVIDER_SITE_OTHER): Payer: Medicare Other | Admitting: *Deleted

## 2015-12-23 ENCOUNTER — Other Ambulatory Visit (INDEPENDENT_AMBULATORY_CARE_PROVIDER_SITE_OTHER): Payer: Medicare Other

## 2015-12-23 DIAGNOSIS — E119 Type 2 diabetes mellitus without complications: Secondary | ICD-10-CM

## 2015-12-23 DIAGNOSIS — E785 Hyperlipidemia, unspecified: Secondary | ICD-10-CM

## 2015-12-23 DIAGNOSIS — E538 Deficiency of other specified B group vitamins: Secondary | ICD-10-CM | POA: Diagnosis not present

## 2015-12-23 LAB — LIPID PANEL
Cholesterol: 144 mg/dL (ref 0–200)
HDL: 42 mg/dL (ref >39.00)
LDL Cholesterol: 74 mg/dL (ref 0–99)
NonHDL: 101.59
Total CHOL/HDL Ratio: 3
Triglycerides: 140 mg/dL (ref 0.0–149.0)
VLDL: 28 mg/dL (ref 0.0–40.0)

## 2015-12-23 LAB — COMPREHENSIVE METABOLIC PANEL
ALT: 20 U/L (ref 0–35)
AST: 14 U/L (ref 0–37)
Albumin: 4.3 g/dL (ref 3.5–5.2)
Alkaline Phosphatase: 94 U/L (ref 39–117)
BUN: 12 mg/dL (ref 6–23)
CO2: 31 mEq/L (ref 19–32)
Calcium: 9.4 mg/dL (ref 8.4–10.5)
Chloride: 98 mEq/L (ref 96–112)
Creatinine, Ser: 0.78 mg/dL (ref 0.40–1.20)
GFR: 78.59 mL/min (ref >60.00)
Glucose, Bld: 152 mg/dL — ABNORMAL HIGH (ref 70–99)
Potassium: 3.6 mEq/L (ref 3.5–5.1)
Sodium: 138 mEq/L (ref 135–145)
Total Bilirubin: 0.4 mg/dL (ref 0.2–1.2)
Total Protein: 7.6 g/dL (ref 6.0–8.3)

## 2015-12-23 LAB — HEMOGLOBIN A1C: Hgb A1c MFr Bld: 7.4 % — ABNORMAL HIGH (ref 4.6–6.5)

## 2015-12-23 MED ORDER — CYANOCOBALAMIN 1000 MCG/ML IJ SOLN
1000.0000 ug | Freq: Once | INTRAMUSCULAR | Status: AC
  Administered 2015-12-23: 1000 ug via INTRAMUSCULAR

## 2015-12-23 NOTE — Progress Notes (Signed)
Pre visit review using our clinic review tool, if applicable. No additional management support is needed unless otherwise documented below in the visit note.  Patient tolerated injection well.  Next appointment: 01/25/16  Dorrene German, RN

## 2015-12-24 ENCOUNTER — Ambulatory Visit (INDEPENDENT_AMBULATORY_CARE_PROVIDER_SITE_OTHER): Payer: Medicare Other | Admitting: Podiatry

## 2015-12-24 ENCOUNTER — Encounter: Payer: Self-pay | Admitting: Podiatry

## 2015-12-24 DIAGNOSIS — E119 Type 2 diabetes mellitus without complications: Secondary | ICD-10-CM | POA: Diagnosis not present

## 2015-12-24 DIAGNOSIS — B351 Tinea unguium: Secondary | ICD-10-CM | POA: Diagnosis not present

## 2015-12-24 DIAGNOSIS — M79676 Pain in unspecified toe(s): Secondary | ICD-10-CM | POA: Diagnosis not present

## 2015-12-25 NOTE — Progress Notes (Signed)
Patient ID: Alyssa Ford, female   DOB: 08-12-1950, 65 y.o.   MRN: [id number]   Subjective: 64y.o.-year-old female returns the office today for painful, elongated, thickened toenails which she is unable to trim herself. Denies any redness or drainage around the nails.he noticed yesterday that her shoe was rubbing on her right foot and she has a small sore for the shoe rubbed over the bunion. Denies any drainage or pus.  Denies any systemic complaints such as fevers, chills, nausea, vomiting.   Objective: AAO 3, NAD DP/PT pulses palpable, CRT less than 3 seconds Protective sensation intact with Simms Weinstein monofilament, Achilles tendon reflex intact.  Nails hypertrophic, dystrophic, elongated, brittle, discolored 4. There is tenderness overlying the nails of bilateral hallux and 5th digit. There is no surrounding erythema or drainage along the nail sites. On the medial aspect of the first metatarsal head of the right foot is a superficial abrasion over the shoe was rubbing. There is no clinical signs of infection.  No open lesions or pre-ulcerative lesions are identified. No other areas of tenderness bilateral lower extremities. No overlying edema, erythema, increased warmth. Adductovarus of bilateral 5th digits.  No pain with calf compression, swelling, warmth, erythema.  Assessment: Patient presents with symptomatic onychomycosis x4; pre-ulcerative lesion right bunion site  Plan: -Treatment options including alternatives, risks, complications were discussed -Nails sharply debrided without complication/bleeding. -Antibiotic ointment and a bandage overlying the abrasion site. Monitor for infection. Nonhealing 2 weeks to call the office. -Discussed daily foot inspection. If there are any changes, to call the office immediately.  -Follow-up in 3 months or sooner if any problems are to arise. In the meantime, encouraged to call the office with any questions, concerns, changes  symptoms.  Celesta Gentile, DPM

## 2015-12-30 DIAGNOSIS — H401131 Primary open-angle glaucoma, bilateral, mild stage: Secondary | ICD-10-CM | POA: Diagnosis not present

## 2015-12-30 DIAGNOSIS — H2513 Age-related nuclear cataract, bilateral: Secondary | ICD-10-CM | POA: Diagnosis not present

## 2016-01-25 ENCOUNTER — Ambulatory Visit (INDEPENDENT_AMBULATORY_CARE_PROVIDER_SITE_OTHER): Payer: Medicare Other | Admitting: Family Medicine

## 2016-01-25 ENCOUNTER — Encounter: Payer: Self-pay | Admitting: Family Medicine

## 2016-01-25 ENCOUNTER — Ambulatory Visit: Payer: Medicare Other

## 2016-01-25 VITALS — BP 122/62 | HR 98 | Temp 98.6°F | Ht 66.0 in | Wt 163.8 lb

## 2016-01-25 DIAGNOSIS — IMO0002 Reserved for concepts with insufficient information to code with codable children: Secondary | ICD-10-CM

## 2016-01-25 DIAGNOSIS — E1151 Type 2 diabetes mellitus with diabetic peripheral angiopathy without gangrene: Secondary | ICD-10-CM | POA: Diagnosis not present

## 2016-01-25 DIAGNOSIS — E1165 Type 2 diabetes mellitus with hyperglycemia: Secondary | ICD-10-CM

## 2016-01-25 DIAGNOSIS — K219 Gastro-esophageal reflux disease without esophagitis: Secondary | ICD-10-CM | POA: Diagnosis not present

## 2016-01-25 DIAGNOSIS — E539 Vitamin B deficiency, unspecified: Secondary | ICD-10-CM

## 2016-01-25 DIAGNOSIS — I10 Essential (primary) hypertension: Secondary | ICD-10-CM | POA: Diagnosis not present

## 2016-01-25 MED ORDER — OMEPRAZOLE 40 MG PO CPDR: 40.0000 mg | DELAYED_RELEASE_CAPSULE | Freq: Every day | ORAL | 3 refills | Status: DC

## 2016-01-25 MED ORDER — CYANOCOBALAMIN 1000 MCG/ML IJ SOLN
1000.0000 ug | Freq: Once | INTRAMUSCULAR | Status: AC
  Administered 2016-01-25: 1000 ug via INTRAMUSCULAR

## 2016-01-25 MED ORDER — METFORMIN HCL 850 MG PO TABS: ORAL_TABLET | ORAL | 5 refills | Status: DC

## 2016-01-25 NOTE — Patient Instructions (Signed)
Food Choices for Gastroesophageal Reflux Disease, Adult When you have gastroesophageal reflux disease (GERD), the foods you eat and your eating habits are very important. Choosing the right foods can help ease the discomfort of GERD. WHAT GENERAL GUIDELINES DO I NEED TO FOLLOW?  Choose fruits, vegetables, whole grains, low-fat dairy products, and low-fat meat, fish, and poultry.  Limit fats such as oils, salad dressings, butter, nuts, and avocado.  Keep a food diary to identify foods that cause symptoms.  Avoid foods that cause reflux. These may be different for different people.  Eat frequent small meals instead of three large meals each day.  Eat your meals slowly, in a relaxed setting.  Limit fried foods.  Cook foods using methods other than frying.  Avoid drinking alcohol.  Avoid drinking large amounts of liquids with your meals.  Avoid bending over or lying down until 2-3 hours after eating. WHAT FOODS ARE NOT RECOMMENDED? The following are some foods and drinks that may worsen your symptoms: Vegetables Tomatoes. Tomato juice. Tomato and spaghetti sauce. Chili peppers. Onion and garlic. Horseradish. Fruits Oranges, grapefruit, and lemon (fruit and juice). Meats High-fat meats, fish, and poultry. This includes hot dogs, ribs, ham, sausage, salami, and bacon. Dairy Whole milk and chocolate milk. Sour cream. Cream. Butter. Ice cream. Cream cheese.  Beverages Coffee and tea, with or without caffeine. Carbonated beverages or energy drinks. Condiments Hot sauce. Barbecue sauce.  Sweets/Desserts Chocolate and cocoa. Donuts. Peppermint and spearmint. Fats and Oils High-fat foods, including French fries and potato chips. Other Vinegar. Strong spices, such as black pepper, white pepper, red pepper, cayenne, curry powder, cloves, ginger, and chili powder. The items listed above may not be a complete list of foods and beverages to avoid. Contact your dietitian for more  information.   This information is not intended to replace advice given to you by your health care provider. Make sure you discuss any questions you have with your health care provider.   Document Released: 06/12/2005 Document Revised: 07/03/2014 Document Reviewed: 04/16/2013 Elsevier Interactive Patient Education 2016 [employer]  

## 2016-01-25 NOTE — Assessment & Plan Note (Addendum)
con't meds  Cont glucose checks Go back to metformin 850 mg 1 in am and 2 in pm

## 2016-01-25 NOTE — Assessment & Plan Note (Signed)
Inc omeprazole to 40 mg daily

## 2016-01-25 NOTE — Assessment & Plan Note (Signed)
Stable con't meds 

## 2016-01-25 NOTE — Progress Notes (Signed)
Pre visit review using our clinic review tool, if applicable. No additional management support is needed unless otherwise documented below in the visit note. Patient ID: Alyssa Ford, female    DOB: 03/02/1951  Age: 65 y.o. MRN: [id number]    Subjective:  Subjective  HPI [name] presents for to review her meds.  There was some confusion over her metformin and she needs verification.  She is also requesting something stronger for her gerd-- she is taking omeprazole 20 mg and it has stopped working.   No other complaint  Review of Systems  Constitutional: Negative for appetite change, diaphoresis, fatigue and unexpected weight change.  Eyes: Negative for pain, redness and visual disturbance.  Respiratory: Negative for cough, chest tightness, shortness of breath and wheezing.   Cardiovascular: Negative for chest pain, palpitations and leg swelling.  Endocrine: Negative for cold intolerance, heat intolerance, polydipsia, polyphagia and polyuria.  Genitourinary: Negative for difficulty urinating, dysuria and frequency.  Neurological: Negative for dizziness, light-headedness, numbness and headaches.    History Past Medical History:  Diagnosis Date  . Anemia   . Arthritis   . Cataract   . Chronic headaches   . Diabetes mellitus   . Diverticulosis of colon (without mention of hemorrhage) 2003, 2008   Colonoscopy  . GERD (gastroesophageal reflux disease)   . Glaucoma   . Hiatal hernia 2003   EGD   . Hyperlipidemia   . Hypertension   . Thyroid nodule     She has a past surgical history that includes Cholecystectomy; Carpal tunnel release (2006); Carpal tunnel release (12/24/2007); and Cervical fusion (3/08).   Her family history includes Breast cancer (age of onset: 42) in her mother; Cancer (age of onset: 79) in her mother; Dementia in her mother; Diabetes in her brother and mother; Diabetes (age of onset: 16) in her father; Heart disease (age of onset: 24) in her cousin;  Hyperlipidemia in her mother; Hypertension in her brother, mother, and sister; Kidney failure (age of onset: 42) in her mother; Parkinsonism in her father.She reports that she quit smoking about 6 months ago. Her smoking use included Cigarettes. She has a 68.60 pack-year smoking history. She uses smokeless tobacco. She reports that she drinks alcohol. She reports that she does not use drugs.  Current Outpatient Prescriptions on File Prior to Visit  Medication Sig Dispense Refill  . amitriptyline (ELAVIL) 25 MG tablet TAKE TWO TABLETS BY MOUTH ONCE DAILY 60 tablet 5  . atorvastatin (LIPITOR) 80 MG tablet TAKE ONE TABLET BY MOUTH ONCE DAILY 30 tablet 5  . BIOTIN PO Take 1 tablet by mouth daily.     Marland Kitchen buPROPion (WELLBUTRIN XL) 150 MG 24 hr tablet TAKE TWO TABLETS BY MOUTH ONCE DAILY IN THE MORNING 60 tablet 5  . diazepam (VALIUM) 5 MG tablet TAKE ONE TABLET BY MOUTH AT BEDTIME AS NEEDED FOR ANXIETY 30 tablet 2  . glimepiride (AMARYL) 4 MG tablet Take 1 tablet (4 mg total) by mouth 2 (two) times daily. 180 tablet 1  . hydrochlorothiazide (HYDRODIURIL) 25 MG tablet TAKE ONE TABLET BY MOUTH ONCE DAILY 30 tablet 5  . latanoprost (XALATAN) 0.005 % ophthalmic solution     . Naproxen Sodium (ALEVE PO) Take 2 tablets by mouth daily.     No current facility-administered medications on file prior to visit.      Objective:  Objective  Physical Exam  Constitutional: She is oriented to person, place, and time. She appears well-developed and well-nourished.  HENT:  Head: Normocephalic and atraumatic.  Eyes: Conjunctivae and EOM are normal.  Neck: Normal range of motion. Neck supple. No JVD present. Carotid bruit is not present. No thyromegaly present.  Cardiovascular: Normal rate, regular rhythm and normal heart sounds.   No murmur heard. Pulmonary/Chest: Effort normal and breath sounds normal. No respiratory distress. She has no wheezes. She has no rales. She exhibits no tenderness.  Musculoskeletal: She  exhibits no edema.  Neurological: She is alert and oriented to person, place, and time.  Psychiatric: She has a normal mood and affect.  Nursing note and vitals reviewed.  BP 122/62 (BP Location: Left Arm, Patient Position: Sitting, Cuff Size: Normal)   Pulse 98   Temp 98.6 F (37 C) (Oral)   Ht 5\' 6"  (1.676 m)   Wt 163 lb 12.8 oz (74.3 kg)   SpO2 98%   BMI 26.44 kg/m  Wt Readings from Last 3 Encounters:  01/25/16 163 lb 12.8 oz (74.3 kg)  09/13/15 165 lb (74.8 kg)  03/18/15 160 lb 12.8 oz (72.9 kg)     Lab Results  Component Value Date   WBC 10.1 09/21/2015   HGB 10.7 (L) 09/21/2015   HCT 33.5 (L) 09/21/2015   PLT 331.0 09/21/2015   GLUCOSE 152 (H) 12/23/2015   CHOL 144 12/23/2015   TRIG 140.0 12/23/2015   HDL 42.00 12/23/2015   LDLDIRECT 197.0 08/25/2011   LDLCALC 74 12/23/2015   ALT 20 12/23/2015   AST 14 12/23/2015   NA 138 12/23/2015   K 3.6 12/23/2015   CL 98 12/23/2015   CREATININE 0.78 12/23/2015   BUN 12 12/23/2015   CO2 31 12/23/2015   TSH 2.89 03/18/2015   HGBA1C 7.4 (H) 12/23/2015   MICROALBUR 0.9 09/21/2015    Mm Screening Breast Tomo Bilateral  Result Date: 10/12/2015 CLINICAL DATA:  Screening. EXAM: 2D DIGITAL SCREENING BILATERAL MAMMOGRAM WITH CAD AND ADJUNCT TOMO COMPARISON:  Previous exam(s). ACR Breast Density Category b: There are scattered areas of fibroglandular density. FINDINGS: There are no findings suspicious for malignancy. Images were processed with CAD. IMPRESSION: No mammographic evidence of malignancy. A result letter of this screening mammogram will be mailed directly to the patient. RECOMMENDATION: Screening mammogram in one year. (Code:SM-B-01Y) BI-RADS CATEGORY  1: Negative. Electronically Signed   By: Ammie Ferrier M.D.   On: 10/12/2015 16:33     Assessment & Plan:  Plan  I have discontinued Ms. Rossi's omeprazole, NON FORMULARY, and metFORMIN. I am also having her start on omeprazole and metFORMIN. Additionally, I am  having her maintain her BIOTIN PO, Naproxen Sodium (ALEVE PO), latanoprost, diazepam, amitriptyline, glimepiride, atorvastatin, hydrochlorothiazide, and buPROPion. We administered cyanocobalamin.  Meds ordered this encounter  Medications  . omeprazole (PRILOSEC) 40 MG capsule    Sig: Take 1 capsule (40 mg total) by mouth daily.    Dispense:  90 capsule    Refill:  3  . metFORMIN (GLUCOPHAGE) 850 MG tablet    Sig: 1 po qam and 2 po qpm    Dispense:  90 tablet    Refill:  5  . cyanocobalamin ((VITAMIN B-12)) injection 1,000 mcg    Problem List Items Addressed This Visit      Unprioritized   GERD - Primary   Relevant Medications   omeprazole (PRILOSEC) 40 MG capsule   DM (diabetes mellitus) type II uncontrolled, periph vascular disorder (HCC)   Relevant Medications   metFORMIN (GLUCOPHAGE) 850 MG tablet    Other Visit Diagnoses    Vitamin B  deficiency       Relevant Medications   cyanocobalamin ((VITAMIN B-12)) injection 1,000 mcg (Completed)      Follow-up: Return in about 3 months (around 04/26/2016) for hypertension, hyperlipidemia, diabetes II, fasting.  Ann Held, DO

## 2016-02-03 ENCOUNTER — Encounter: Payer: Self-pay | Admitting: Family Medicine

## 2016-02-04 ENCOUNTER — Other Ambulatory Visit: Payer: Self-pay | Admitting: Family Medicine

## 2016-02-04 ENCOUNTER — Other Ambulatory Visit: Payer: Self-pay

## 2016-02-04 DIAGNOSIS — F411 Generalized anxiety disorder: Secondary | ICD-10-CM

## 2016-02-04 MED ORDER — DIAZEPAM 5 MG PO TABS: ORAL_TABLET | ORAL | 2 refills | Status: DC

## 2016-02-04 NOTE — Telephone Encounter (Signed)
printed

## 2016-02-04 NOTE — Telephone Encounter (Signed)
Last seen 01/25/16 and filled 03/18/15 #30 with 2 ref   Please advise     KP

## 2016-02-05 ENCOUNTER — Other Ambulatory Visit: Payer: Self-pay | Admitting: Family Medicine

## 2016-02-07 NOTE — Telephone Encounter (Signed)
Medication filled to pharmacy as requested.   

## 2016-03-14 ENCOUNTER — Ambulatory Visit (INDEPENDENT_AMBULATORY_CARE_PROVIDER_SITE_OTHER): Payer: Medicare Other | Admitting: Podiatry

## 2016-03-14 ENCOUNTER — Encounter: Payer: Self-pay | Admitting: Podiatry

## 2016-03-14 VITALS — BP 133/77 | HR 99 | Temp 97.5°F | Resp 18

## 2016-03-14 DIAGNOSIS — L03032 Cellulitis of left toe: Secondary | ICD-10-CM | POA: Diagnosis not present

## 2016-03-14 DIAGNOSIS — S90822A Blister (nonthermal), left foot, initial encounter: Secondary | ICD-10-CM | POA: Diagnosis not present

## 2016-03-14 DIAGNOSIS — E119 Type 2 diabetes mellitus without complications: Secondary | ICD-10-CM

## 2016-03-14 MED ORDER — CEPHALEXIN 500 MG PO CAPS: 500.0000 mg | ORAL_CAPSULE | Freq: Three times a day (TID) | ORAL | 2 refills | Status: DC

## 2016-03-14 NOTE — Patient Instructions (Signed)
Monitor for any signs/symptoms of infection. Call the office immediately if any occur or go directly to the emergency room. Call with any questions/concerns.  

## 2016-03-16 ENCOUNTER — Ambulatory Visit: Payer: Medicare Other | Admitting: Family Medicine

## 2016-03-16 NOTE — Progress Notes (Signed)
Subjective: Patient presents today for concerns of a blister on the left big toe which started over the weekend and the area has been getting bigger and she has noticed some red around the blister but denies any red streaks. She has not popped the blister. She was wearing shoes when this started. No treatments.  Denies any systemic complaints such as fevers, chills, nausea, vomiting. No acute changes since last appointment, and no other complaints at this time.   Objective: AAO x3, NAD DP/PT pulses palpable bilaterally, CRT less than 3 seconds On the left hallux is a large serous filled bulla with faint amount of surrounding redness. Upon draining, there was only a small amount of clear drainage expressed. There is no pus. There is no ascending cellulitis. There is no crepitus. No other blisters are present. No open lesions or pre-ulcerative lesions.  No pain with calf compression, swelling, warmth, erythema  Assessment: Left hallux bulla  Plan: -All treatment options discussed with the patient including all alternatives, risks, complications.  -Under sterile conditions a 18-gauge needle was utilized to puncture the bulla and clear fluid was expressed. This area was cultured. Betadine was painted over the blister followed by a dressing. Continue with antibiotic ointment dressing changes daily. We'll start Keflex. Follow-up next week for her scheduled appointment or sooner if needed. Monitor for infection. -Patient encouraged to call the office with any questions, concerns, change in symptoms.   Celesta Gentile, DPM

## 2016-03-24 ENCOUNTER — Telehealth: Payer: Self-pay | Admitting: Acute Care

## 2016-03-24 ENCOUNTER — Ambulatory Visit (INDEPENDENT_AMBULATORY_CARE_PROVIDER_SITE_OTHER): Payer: Medicare Other | Admitting: Podiatry

## 2016-03-24 ENCOUNTER — Encounter: Payer: Self-pay | Admitting: Podiatry

## 2016-03-24 DIAGNOSIS — M79676 Pain in unspecified toe(s): Secondary | ICD-10-CM | POA: Diagnosis not present

## 2016-03-24 DIAGNOSIS — B351 Tinea unguium: Secondary | ICD-10-CM

## 2016-03-24 DIAGNOSIS — L03032 Cellulitis of left toe: Secondary | ICD-10-CM

## 2016-03-24 DIAGNOSIS — S90822A Blister (nonthermal), left foot, initial encounter: Secondary | ICD-10-CM | POA: Diagnosis not present

## 2016-03-24 DIAGNOSIS — E119 Type 2 diabetes mellitus without complications: Secondary | ICD-10-CM

## 2016-03-24 NOTE — Progress Notes (Signed)
Patient ID: Alyssa Ford, female   DOB: Oct 12, 1950, 65 y.o.   MRN: [id number]   Subjective: 64y.o.-year-old female returns the office today for painful, elongated, thickened toenails which she is unable to trim herself. Denies any redness or drainage around the nails. Has a presents today for blister to her left big toe which is doing better. She is having some clear drainage from the area. Denies any pus. Denies any surrounding redness or red streaks. She is continuing antibiotics.  Denies any systemic complaints such as fevers, chills, nausea, vomiting.   Objective: AAO 3, NAD DP/PT pulses palpable, CRT less than 3 seconds Protective sensation intact with Simms Weinstein monofilament, Achilles tendon reflex intact.  Nails hypertrophic, dystrophic, elongated, brittle, discolored 4. There is tenderness overlying the nails of bilateral hallux and 5th digit. There is no surrounding erythema or drainage along the nail sites. Along the left hallux along the site of the blistered area is drying. There is no surrounding erythema, ascending cellulitis. There is no fluctuance or crepitus there is no drainage expressed today. No malodor. No open sore or signs of infection. No open lesions or pre-ulcerative lesions are identified. No other areas of tenderness bilateral lower extremities. No overlying edema, erythema, increased warmth. Adductovarus of bilateral 5th digits.  No pain with calf compression, swelling, warmth, erythema.  Assessment: Patient presents with symptomatic onychomycosis x4; resolving blister left hallux   Plan: -Treatment options including alternatives, risks, complications were discussed -Nails sharply debrided without complication/bleeding. -Entity antibiotic cream for the blister site. Not completely resolve the next couple weeks or to call the office or sooner if there is any issues. -Discussed daily foot inspection. If there are any changes, to call the office immediately.   -Follow-up in 3 months or sooner if any problems are to arise. In the meantime, encouraged to call the office with any questions, concerns, changes symptoms.  Celesta Gentile, DPM

## 2016-03-30 NOTE — Telephone Encounter (Signed)
l °

## 2016-03-30 NOTE — Telephone Encounter (Signed)
Will forward to [facility] for the lung cancer screening appt.

## 2016-04-03 ENCOUNTER — Encounter: Payer: Self-pay | Admitting: Family Medicine

## 2016-04-03 ENCOUNTER — Encounter: Payer: Self-pay | Admitting: Acute Care

## 2016-04-03 NOTE — Telephone Encounter (Signed)
The patient last B-12 was done 6 mos ago and was 453. Please advise if it is ok to do the injections.  Please advise     KP

## 2016-04-04 NOTE — Telephone Encounter (Signed)
Will leave decision up to Dr. Etter Sjogren since this is not an urgent matter.

## 2016-04-05 ENCOUNTER — Other Ambulatory Visit: Payer: Self-pay | Admitting: Family Medicine

## 2016-04-05 NOTE — Telephone Encounter (Signed)
Ok to get b12 monthly

## 2016-04-05 NOTE — Telephone Encounter (Signed)
Called spoke with pt. She states she received call from [place] to schedule CT. I explained to her that Rodena Piety would return her call. She voiced understanding and had no further questions.   Will send to [facility] for follow up.

## 2016-04-06 NOTE — Telephone Encounter (Signed)
Patient is scheduled for Lung Cancer Screening CT on 04/21/2016 @ 9:00am and is aware of appt

## 2016-04-09 ENCOUNTER — Other Ambulatory Visit: Payer: Self-pay | Admitting: Family Medicine

## 2016-04-12 ENCOUNTER — Ambulatory Visit (INDEPENDENT_AMBULATORY_CARE_PROVIDER_SITE_OTHER): Payer: Medicare Other | Admitting: *Deleted

## 2016-04-12 DIAGNOSIS — E538 Deficiency of other specified B group vitamins: Secondary | ICD-10-CM | POA: Diagnosis not present

## 2016-04-12 DIAGNOSIS — Z23 Encounter for immunization: Secondary | ICD-10-CM | POA: Diagnosis not present

## 2016-04-12 MED ORDER — CYANOCOBALAMIN 1000 MCG/ML IJ SOLN
1000.0000 ug | Freq: Once | INTRAMUSCULAR | Status: AC
  Administered 2016-04-12: 1000 ug via INTRAMUSCULAR

## 2016-04-12 NOTE — Progress Notes (Signed)
Pre visit review using our clinic review tool, if applicable. No additional management support is needed unless otherwise documented below in the visit note.  Patient tolerated injection well.  Next appointment: 05/16/16  Dorrene German, RN

## 2016-04-21 ENCOUNTER — Telehealth: Payer: Self-pay | Admitting: Acute Care

## 2016-04-21 ENCOUNTER — Ambulatory Visit (INDEPENDENT_AMBULATORY_CARE_PROVIDER_SITE_OTHER)
Admission: RE | Admit: 2016-04-21 | Discharge: 2016-04-21 | Disposition: A | Discharge status: Home / Self Care | Payer: Medicare Other | Source: Ambulatory Visit | Attending: Acute Care | Admitting: Acute Care

## 2016-04-21 DIAGNOSIS — Z87891 Personal history of nicotine dependence: Secondary | ICD-10-CM | POA: Diagnosis not present

## 2016-04-21 DIAGNOSIS — M79642 Pain in left hand: Secondary | ICD-10-CM | POA: Diagnosis not present

## 2016-04-21 DIAGNOSIS — M65342 Trigger finger, left ring finger: Secondary | ICD-10-CM | POA: Diagnosis not present

## 2016-04-21 NOTE — Telephone Encounter (Signed)
I have called Alyssa Ford with the results of her low dose CT scan. I explained that her scan was read as a lung RADS 2, nodules that are benign in appearance or behavior with recommendation that she repeat annual screening in October 2018. Additionally we discussed the fact that there was notation of one-vessel coronary atherosclerosis and aortic atherosclerosis noted on the scan. She stated that this was noted last year, and that she is currently taking Lipitor. There was notation of minimal emphysema, and a stable mixed cystic and solid partially  calcified nodule in the anterior upper mediastinum that is felt to be an exophytic inferior left thyroid lobe nodule. The patient says that this has been worked up in the past. Alyssa Ford verbalized understanding of the above and had no further questions. I told her that I will fax a copy to her primary care physician Dr. Perlie Gold for completeness of her medical record.

## 2016-04-28 ENCOUNTER — Ambulatory Visit (INDEPENDENT_AMBULATORY_CARE_PROVIDER_SITE_OTHER): Payer: Medicare Other | Admitting: Family Medicine

## 2016-04-28 ENCOUNTER — Encounter: Payer: Self-pay | Admitting: Family Medicine

## 2016-04-28 VITALS — BP 128/70 | HR 77 | Temp 98.6°F | Resp 16 | Ht 66.0 in | Wt 165.2 lb

## 2016-04-28 DIAGNOSIS — F329 Major depressive disorder, single episode, unspecified: Secondary | ICD-10-CM | POA: Diagnosis not present

## 2016-04-28 DIAGNOSIS — R8299 Other abnormal findings in urine: Secondary | ICD-10-CM | POA: Diagnosis not present

## 2016-04-28 DIAGNOSIS — E1165 Type 2 diabetes mellitus with hyperglycemia: Secondary | ICD-10-CM

## 2016-04-28 DIAGNOSIS — E785 Hyperlipidemia, unspecified: Secondary | ICD-10-CM | POA: Diagnosis not present

## 2016-04-28 DIAGNOSIS — R8290 Unspecified abnormal findings in urine: Secondary | ICD-10-CM | POA: Diagnosis not present

## 2016-04-28 DIAGNOSIS — E1151 Type 2 diabetes mellitus with diabetic peripheral angiopathy without gangrene: Secondary | ICD-10-CM | POA: Diagnosis not present

## 2016-04-28 DIAGNOSIS — F32A Depression, unspecified: Secondary | ICD-10-CM

## 2016-04-28 DIAGNOSIS — I10 Essential (primary) hypertension: Secondary | ICD-10-CM

## 2016-04-28 DIAGNOSIS — R82998 Other abnormal findings in urine: Secondary | ICD-10-CM

## 2016-04-28 DIAGNOSIS — IMO0002 Reserved for concepts with insufficient information to code with codable children: Secondary | ICD-10-CM

## 2016-04-28 LAB — POCT URINALYSIS DIPSTICK
Bilirubin, UA: NEGATIVE
Blood, UA: NEGATIVE
Glucose, UA: NEGATIVE
Ketones, UA: NEGATIVE
Nitrite, UA: NEGATIVE
Protein, UA: NEGATIVE
Spec Grav, UA: 1.025
Urobilinogen, UA: 0.2
pH, UA: 6

## 2016-04-28 LAB — LIPID PANEL
Cholesterol: 127 mg/dL (ref 0–200)
HDL: 41.5 mg/dL (ref >39.00)
LDL Cholesterol: 57 mg/dL (ref 0–99)
NonHDL: 85.57
Total CHOL/HDL Ratio: 3
Triglycerides: 142 mg/dL (ref 0.0–149.0)
VLDL: 28.4 mg/dL (ref 0.0–40.0)

## 2016-04-28 LAB — COMPREHENSIVE METABOLIC PANEL
ALT: 20 U/L (ref 0–35)
AST: 14 U/L (ref 0–37)
Albumin: 4.4 g/dL (ref 3.5–5.2)
Alkaline Phosphatase: 84 U/L (ref 39–117)
BUN: 20 mg/dL (ref 6–23)
CO2: 30 mEq/L (ref 19–32)
Calcium: 9.8 mg/dL (ref 8.4–10.5)
Chloride: 96 mEq/L (ref 96–112)
Creatinine, Ser: 0.95 mg/dL (ref 0.40–1.20)
GFR: 62.53 mL/min (ref >60.00)
Glucose, Bld: 107 mg/dL — ABNORMAL HIGH (ref 70–99)
Potassium: 3.9 mEq/L (ref 3.5–5.1)
Sodium: 137 mEq/L (ref 135–145)
Total Bilirubin: 0.3 mg/dL (ref 0.2–1.2)
Total Protein: 7.3 g/dL (ref 6.0–8.3)

## 2016-04-28 LAB — HEMOGLOBIN A1C: Hgb A1c MFr Bld: 7.1 % — ABNORMAL HIGH (ref 4.6–6.5)

## 2016-04-28 LAB — MICROALBUMIN / CREATININE URINE RATIO
Creatinine,U: 78.4 mg/dL
Microalb Creat Ratio: 1.8 mg/g (ref 0.0–30.0)
Microalb, Ur: 1.4 mg/dL (ref 0.0–1.9)

## 2016-04-28 MED ORDER — METFORMIN HCL 850 MG PO TABS: ORAL_TABLET | ORAL | 3 refills | Status: DC

## 2016-04-28 MED ORDER — BUPROPION HCL ER (XL) 150 MG PO TB24: ORAL_TABLET | ORAL | 3 refills | Status: DC

## 2016-04-28 MED ORDER — GLIMEPIRIDE 4 MG PO TABS: 4.0000 mg | ORAL_TABLET | Freq: Two times a day (BID) | ORAL | 3 refills | Status: DC

## 2016-04-28 NOTE — Assessment & Plan Note (Signed)
Seems under better control per pt readings Check labs

## 2016-04-28 NOTE — Progress Notes (Signed)
Pre visit review using our clinic review tool, if applicable. No additional management support is needed unless otherwise documented below in the visit note. 

## 2016-04-28 NOTE — Progress Notes (Signed)
Patient ID: Alyssa Ford, female    DOB: 1950-10-21  Age: 65 y.o. MRN: [id number]    Subjective:  Subjective  HPI Alyssa Ford presents for f/u dm, cholesterol and htn.  HPI HYPERTENSION   Blood pressure range-not checked   Chest pain- no      Dyspnea- no Lightheadedness- no   Edema- no  Other side effects - no   Medication compliance: good Low salt diet- yes    DIABETES    Blood Sugar ranges-90-130  Polyuria- no New Visual problems- no  Hypoglycemic symptoms- yes at 90--jittery Other side effects-no Medication compliance - good Last eye exam- July 2017 Foot exam- today   HYPERLIPIDEMIA  Medication compliance- good RUQ pain- no  Muscle aches- no Other side effects-no   Review of Systems  Constitutional: Negative for appetite change, diaphoresis, fatigue and unexpected weight change.  Eyes: Negative for pain, redness and visual disturbance.  Respiratory: Negative for cough, chest tightness, shortness of breath and wheezing.   Cardiovascular: Negative for chest pain, palpitations and leg swelling.  Endocrine: Negative for cold intolerance, heat intolerance, polydipsia, polyphagia and polyuria.  Genitourinary: Negative for difficulty urinating, dysuria and frequency.  Neurological: Negative for dizziness, light-headedness, numbness and headaches.    History Past Medical History:  Diagnosis Date  . Anemia   . Arthritis   . Cataract   . Chronic headaches   . Diabetes mellitus   . Diverticulosis of colon (without mention of hemorrhage) 2003, 2008   Colonoscopy  . GERD (gastroesophageal reflux disease)   . Glaucoma   . Hiatal hernia 2003   EGD   . Hyperlipidemia   . Hypertension   . Thyroid nodule     She has a past surgical history that includes Cholecystectomy; Carpal tunnel release (2006); Carpal tunnel release (12/24/2007); and Cervical fusion (3/08).   Her family history includes Breast cancer (age of onset: 61) in her mother; Cancer (age of onset:  36) in her mother; Dementia in her mother; Diabetes in her brother and mother; Diabetes (age of onset: 46) in her father; Heart disease (age of onset: 70) in her cousin; Hyperlipidemia in her mother; Hypertension in her brother, mother, and sister; Kidney failure (age of onset: 44) in her mother; Parkinsonism in her father.She reports that she quit smoking about 9 months ago. Her smoking use included Cigarettes. She has a 68.60 pack-year smoking history. She uses smokeless tobacco. She reports that she drinks alcohol. She reports that she does not use drugs.  Current Outpatient Prescriptions on File Prior to Visit  Medication Sig Dispense Refill  . amitriptyline (ELAVIL) 25 MG tablet TAKE TWO TABLETS BY MOUTH ONCE DAILY 60 tablet 5  . atorvastatin (LIPITOR) 80 MG tablet TAKE ONE TABLET BY MOUTH ONCE DAILY 30 tablet 5  . BIOTIN PO Take 1 tablet by mouth daily.     . cephALEXin (KEFLEX) 500 MG capsule Take 1 capsule (500 mg total) by mouth 3 (three) times daily. 30 capsule 2  . diazepam (VALIUM) 5 MG tablet TAKE ONE TABLET BY MOUTH AT BEDTIME AS NEEDED FOR ANXIETY 30 tablet 2  . hydrochlorothiazide (HYDRODIURIL) 25 MG tablet TAKE ONE TABLET BY MOUTH ONCE DAILY 30 tablet 5  . latanoprost (XALATAN) 0.005 % ophthalmic solution     . Naproxen Sodium (ALEVE PO) Take 2 tablets by mouth daily.    Marland Kitchen omeprazole (PRILOSEC) 40 MG capsule Take 1 capsule (40 mg total) by mouth daily. 90 capsule 3   No current facility-administered medications on  file prior to visit.      Objective:  Objective  Physical Exam  Constitutional: She is oriented to person, place, and time. She appears well-developed and well-nourished.  HENT:  Head: Normocephalic and atraumatic.  Eyes: Conjunctivae and EOM are normal.  Neck: Normal range of motion. Neck supple. No JVD present. Carotid bruit is not present. No thyromegaly present.  Cardiovascular: Normal rate, regular rhythm and normal heart sounds.   No murmur  heard. Pulmonary/Chest: Effort normal and breath sounds normal. No respiratory distress. She has no wheezes. She has no rales. She exhibits no tenderness.  Musculoskeletal: She exhibits no edema.  Neurological: She is alert and oriented to person, place, and time.  Psychiatric: She has a normal mood and affect. Her behavior is normal.  Nursing note and vitals reviewed. Sensory exam of the foot is normal, tested with the monofilament. Good pulses, no lesions or ulcers, good peripheral pulses. Sensory exam of the foot is normal, tested with the monofilament. Good pulses, no lesions or ulcers, good peripheral pulses. BP 128/70 (BP Location: Left Arm, Patient Position: Sitting, Cuff Size: Normal)   Pulse 77   Temp 98.6 F (37 C) (Oral)   Resp 16   Ht 5\' 6"  (1.676 m)   Wt 165 lb 3.2 oz (74.9 kg)   SpO2 96%   BMI 26.66 kg/m  Wt Readings from Last 3 Encounters:  04/28/16 165 lb 3.2 oz (74.9 kg)  01/25/16 163 lb 12.8 oz (74.3 kg)  09/13/15 165 lb (74.8 kg)     Lab Results  Component Value Date   WBC 10.1 09/21/2015   HGB 10.7 (L) 09/21/2015   HCT 33.5 (L) 09/21/2015   PLT 331.0 09/21/2015   GLUCOSE 152 (H) 12/23/2015   CHOL 144 12/23/2015   TRIG 140.0 12/23/2015   HDL 42.00 12/23/2015   LDLDIRECT 197.0 08/25/2011   LDLCALC 74 12/23/2015   ALT 20 12/23/2015   AST 14 12/23/2015   NA 138 12/23/2015   K 3.6 12/23/2015   CL 98 12/23/2015   CREATININE 0.78 12/23/2015   BUN 12 12/23/2015   CO2 31 12/23/2015   TSH 2.89 03/18/2015   HGBA1C 7.4 (H) 12/23/2015   MICROALBUR 0.9 09/21/2015    Ct Chest Lung Ca Screen Low Dose W/o Cm  Result Date: 04/21/2016 CLINICAL DATA:  65 year old asymptomatic female former smoker with 66 pack-year smoking history, quit smoking in January 2017. EXAM: CT CHEST WITHOUT CONTRAST LOW-DOSE FOR LUNG CANCER SCREENING TECHNIQUE: Multidetector CT imaging of the chest was performed following the standard protocol without IV contrast. COMPARISON:  04/21/2015  screening chest CT. FINDINGS: Cardiovascular: Normal heart size. No significant pericardial fluid/thickening. Right coronary atherosclerosis. Atherosclerotic nonaneurysmal thoracic aorta. Normal caliber pulmonary arteries. Mediastinum/Nodes: Stable mixed cystic and solid and partially calcified 3.1 cm nodule in the anterior upper mediastinum (series 2/ image 9), favored represent substernal extension of an exophytic inferior left thyroid lobe nodule. Unremarkable esophagus. No pathologically enlarged axillary, mediastinal or gross hilar lymph nodes, noting limited sensitivity for the detection of hilar adenopathy on this noncontrast study. Lungs/Pleura: No pneumothorax. No pleural effusion. Minimal centrilobular and paraseptal emphysema with mild diffuse bronchial wall thickening. Scattered tiny pulmonary nodules measuring up to 3.4 mm in volume derived mean diameter in the right upper lobe (series 3/ image 77) have not significantly changed. No new significant pulmonary nodules. Upper abdomen: Unremarkable. Musculoskeletal: No aggressive appearing focal osseous lesions. Mild thoracic spondylosis. Partially visualized surgical hardware from ACDF in the lower cervical spine. IMPRESSION: 1. Lung-RADS  Category 2S, benign appearance or behavior. Continue annual screening with low-dose chest CT without contrast in 12 months. 2. The "S" modifier above refers to potentially clinically significant non lung cancer related findings. Specifically, 1 vessel coronary atherosclerosis. 3. Aortic atherosclerosis . 4. Minimal emphysema with mild diffuse bronchial wall thickening, suggesting COPD . 5. Stable mixed cystic and solid partially calcified 3.1 cm nodule in the anterior upper mediastinum, favor substernal extension of an exophytic inferior left thyroid lobe nodule. Electronically Signed   By: Ilona Sorrel M.D.   On: 04/21/2016 13:38     Assessment & Plan:  Plan  I have changed Alyssa Ford's glimepiride. I am also  having her maintain her BIOTIN PO, Naproxen Sodium (ALEVE PO), latanoprost, omeprazole, diazepam, amitriptyline, cephALEXin, atorvastatin, hydrochlorothiazide, metFORMIN, and buPROPion.  Meds ordered this encounter  Medications  . metFORMIN (GLUCOPHAGE) 850 MG tablet    Sig: 1 po qam and 2 po qpm    Dispense:  270 tablet    Refill:  3  . glimepiride (AMARYL) 4 MG tablet    Sig: Take 1 tablet (4 mg total) by mouth 2 (two) times daily.    Dispense:  180 tablet    Refill:  3  . buPROPion (WELLBUTRIN XL) 150 MG 24 hr tablet    Sig: TAKE TWO TABLETS BY MOUTH ONCE DAILY IN THE MORNING    Dispense:  180 tablet    Refill:  3    Problem List Items Addressed This Visit      Unprioritized   DM (diabetes mellitus) type II uncontrolled, periph vascular disorder ([place] [facility])    Seems under better control per pt readings Check labs      Relevant Medications   metFORMIN (GLUCOPHAGE) 850 MG tablet   glimepiride (AMARYL) 4 MG tablet   Other Relevant Orders   Comprehensive metabolic panel   Hemoglobin A1c   POCT urinalysis dipstick   Microalbumin / creatinine urine ratio   Essential hypertension    Stable con't hctz      Hyperlipidemia LDL goal <70    Check labs       Relevant Orders   Lipid panel   POCT urinalysis dipstick   Microalbumin / creatinine urine ratio    Other Visit Diagnoses    Depression, unspecified depression type    -  Primary   Relevant Medications   buPROPion (WELLBUTRIN XL) 150 MG 24 hr tablet   Abnormal urine       Relevant Orders   Urine Culture   Urine leukocytes       Relevant Orders   Urine Culture      Follow-up: Return in about 6 months (around 10/26/2016) for annual exam, fasting.  Ann Held, DO

## 2016-04-28 NOTE — Assessment & Plan Note (Signed)
Check labs 

## 2016-04-28 NOTE — Assessment & Plan Note (Signed)
Stable  con't hctz  

## 2016-04-28 NOTE — Patient Instructions (Signed)

## 2016-04-29 LAB — URINE CULTURE: Organism ID, Bacteria: NO GROWTH

## 2016-05-01 ENCOUNTER — Telehealth: Payer: Self-pay

## 2016-05-01 DIAGNOSIS — E1151 Type 2 diabetes mellitus with diabetic peripheral angiopathy without gangrene: Secondary | ICD-10-CM

## 2016-05-01 NOTE — Telephone Encounter (Signed)
-----   Message from Ann Held, DO sent at 04/28/2016  5:34 PM EDT ----- Dm ok-- watch diet  Recheck 6 months---lipid, cmp, hgba1c

## 2016-05-01 NOTE — Telephone Encounter (Signed)
I have alerted patient of results and placed orders. TL/CMA

## 2016-05-16 ENCOUNTER — Ambulatory Visit (INDEPENDENT_AMBULATORY_CARE_PROVIDER_SITE_OTHER): Payer: Medicare Other | Admitting: *Deleted

## 2016-05-16 DIAGNOSIS — E538 Deficiency of other specified B group vitamins: Secondary | ICD-10-CM | POA: Diagnosis not present

## 2016-05-16 MED ORDER — CYANOCOBALAMIN 1000 MCG/ML IJ SOLN
1000.0000 ug | Freq: Once | INTRAMUSCULAR | Status: AC
  Administered 2016-05-16: 1000 ug via INTRAMUSCULAR

## 2016-05-16 NOTE — Progress Notes (Signed)
Pre visit review using our clinic review tool, if applicable. No additional management support is needed unless otherwise documented below in the visit note.  Patient tolerated injection well.  Next appointment: 06/15/16  Dorrene German, RN

## 2016-06-15 ENCOUNTER — Ambulatory Visit (INDEPENDENT_AMBULATORY_CARE_PROVIDER_SITE_OTHER): Payer: Medicare Other

## 2016-06-15 DIAGNOSIS — E538 Deficiency of other specified B group vitamins: Secondary | ICD-10-CM

## 2016-06-15 MED ORDER — CYANOCOBALAMIN 1000 MCG/ML IJ SOLN
1000.0000 ug | Freq: Once | INTRAMUSCULAR | Status: AC
  Administered 2016-06-15: 1000 ug via INTRAMUSCULAR

## 2016-06-15 NOTE — Progress Notes (Signed)
Pre visit review using our clinic tool,if applicable. No additional management support is needed unless otherwise documented below in the visit note.   Patient in for B12 injection per order from Dr. Garnet Koyanagi, M.D.  Given IM Right Deltoid. Patient tolerated well.

## 2016-06-23 ENCOUNTER — Ambulatory Visit: Payer: Medicare Other | Admitting: Podiatry

## 2016-07-06 DIAGNOSIS — H25813 Combined forms of age-related cataract, bilateral: Secondary | ICD-10-CM | POA: Diagnosis not present

## 2016-07-06 DIAGNOSIS — E119 Type 2 diabetes mellitus without complications: Secondary | ICD-10-CM | POA: Diagnosis not present

## 2016-07-06 DIAGNOSIS — H401131 Primary open-angle glaucoma, bilateral, mild stage: Secondary | ICD-10-CM | POA: Diagnosis not present

## 2016-07-06 DIAGNOSIS — H524 Presbyopia: Secondary | ICD-10-CM | POA: Diagnosis not present

## 2016-07-07 ENCOUNTER — Encounter: Payer: Self-pay | Admitting: Gastroenterology

## 2016-07-08 ENCOUNTER — Other Ambulatory Visit: Payer: Self-pay | Admitting: Family Medicine

## 2016-07-14 ENCOUNTER — Ambulatory Visit: Payer: Medicare Other

## 2016-07-18 ENCOUNTER — Encounter: Payer: Self-pay | Admitting: Gastroenterology

## 2016-07-20 ENCOUNTER — Ambulatory Visit (INDEPENDENT_AMBULATORY_CARE_PROVIDER_SITE_OTHER): Payer: Medicare Other | Admitting: Behavioral Health

## 2016-07-20 DIAGNOSIS — E538 Deficiency of other specified B group vitamins: Secondary | ICD-10-CM

## 2016-07-20 MED ORDER — CYANOCOBALAMIN 1000 MCG/ML IJ SOLN
1000.0000 ug | Freq: Once | INTRAMUSCULAR | Status: AC
  Administered 2016-07-20: 1000 ug via INTRAMUSCULAR

## 2016-07-20 NOTE — Progress Notes (Signed)
Pre visit review using our clinic review tool, if applicable. No additional management support is needed unless otherwise documented below in the visit note.  Patient came in clinic today for B12 injection. IM given in Right Deltoid. Patient tolerated injection well.  Next appointment scheduled for 08/22/16 at 10:00 AM.

## 2016-07-24 DIAGNOSIS — L820 Inflamed seborrheic keratosis: Secondary | ICD-10-CM | POA: Diagnosis not present

## 2016-07-28 ENCOUNTER — Ambulatory Visit (INDEPENDENT_AMBULATORY_CARE_PROVIDER_SITE_OTHER): Payer: Medicare Other | Admitting: Medical

## 2016-07-28 ENCOUNTER — Telehealth: Payer: Self-pay | Admitting: Family Medicine

## 2016-07-28 ENCOUNTER — Encounter: Payer: Self-pay | Admitting: Medical

## 2016-07-28 VITALS — BP 119/57 | HR 84 | Temp 98.3°F | Resp 16 | Ht 66.0 in | Wt 163.5 lb

## 2016-07-28 DIAGNOSIS — J060 Acute laryngopharyngitis: Secondary | ICD-10-CM | POA: Diagnosis not present

## 2016-07-28 DIAGNOSIS — R05 Cough: Secondary | ICD-10-CM | POA: Diagnosis not present

## 2016-07-28 DIAGNOSIS — R059 Cough, unspecified: Secondary | ICD-10-CM

## 2016-07-28 DIAGNOSIS — R062 Wheezing: Secondary | ICD-10-CM

## 2016-07-28 DIAGNOSIS — J0100 Acute maxillary sinusitis, unspecified: Secondary | ICD-10-CM

## 2016-07-28 LAB — POCT RAPID STREP A (OFFICE): Rapid Strep A Screen: NEGATIVE

## 2016-07-28 MED ORDER — BENZONATATE 100 MG PO CAPS: 100.0000 mg | ORAL_CAPSULE | Freq: Three times a day (TID) | ORAL | 0 refills | Status: DC | PRN

## 2016-07-28 MED ORDER — AZITHROMYCIN 250 MG PO TABS: ORAL_TABLET | ORAL | 0 refills | Status: DC

## 2016-07-28 MED ORDER — ALBUTEROL SULFATE HFA 108 (90 BASE) MCG/ACT IN AERS: 2.0000 | INHALATION_SPRAY | Freq: Four times a day (QID) | RESPIRATORY_TRACT | 0 refills | Status: DC | PRN

## 2016-07-28 MED ORDER — FLUTICASONE PROPIONATE 50 MCG/ACT NA SUSP: 2.0000 | Freq: Every day | NASAL | 1 refills | Status: DC

## 2016-07-28 NOTE — Progress Notes (Signed)
Subjective:    Patient ID: Alyssa Ford, female    DOB: October 05, 1950, 66 y.o.   MRN: [id number]  HPI  Pt in with sore throat(moderate) Wednesday morning. Hoarse voice/laryngitis like yesterday. No fever, no chills or sweats. Mild fatigue. No diffuse bodyaches. Pt does have grandchildren. Pt husband sick with uri. Pt not coughing up mucous. But some cough at night. Mild nasal congestion. Faint sinus pressure.   Pt former smoker.   Pt states slight wheezing. No current inhaler. Occasional need inhaler in past when she gets sick.    Review of Systems  Constitutional: Positive for fatigue. Negative for chills and fever.  HENT: Positive for congestion, sinus pain, sinus pressure and sore throat. Negative for ear pain and rhinorrhea.   Respiratory: Positive for cough and wheezing. Negative for chest tightness and shortness of breath.        Rare mild wheeze.  Cardiovascular: Negative for chest pain and palpitations.  Gastrointestinal: Negative for abdominal pain.  Musculoskeletal: Negative for back pain and myalgias.  Skin: Negative for rash.  Neurological: Negative for dizziness and headaches.  Hematological: Negative for adenopathy. Does not bruise/bleed easily.  Psychiatric/Behavioral: Negative for behavioral problems and confusion.    Past Medical History:  Diagnosis Date  . Anemia   . Arthritis   . Cataract   . Chronic headaches   . Diabetes mellitus   . Diverticulosis of colon (without mention of hemorrhage) 2003, 2008   Colonoscopy  . GERD (gastroesophageal reflux disease)   . Glaucoma   . Hiatal hernia 2003   EGD   . Hyperlipidemia   . Hypertension   . Thyroid nodule      Social History   Social History  . Marital status: Married    Spouse name: N/A  . Number of children: N/A  . Years of education: N/A   Occupational History  . retired    Social History Main Topics  . Smoking status: Former Smoker    Packs/day: 1.40    Years: 49.00    Types:  Cigarettes    Quit date: 07/16/2015  . Smokeless tobacco: Current User     Comment: e cig  . Alcohol use 0.0 oz/week     Comment: occ  . Drug use: No  . Sexual activity: Yes    Partners: Male   Other Topics Concern  . Not on file   Social History Narrative   Exercise--  walk    Past Surgical History:  Procedure Laterality Date  . CARPAL TUNNEL RELEASE  2006  . CARPAL TUNNEL RELEASE  12/24/2007  . CERVICAL FUSION  3/08  . CHOLECYSTECTOMY      Family History  Problem Relation Age of Onset  . Dementia Mother   . Breast cancer Mother 14  . Kidney failure Mother 84    died from kidney failure from abx given to her for pneumonia  . Hyperlipidemia Mother   . Hypertension Mother   . Diabetes Mother   . Cancer Mother 47    breast  . Parkinsonism Father   . Diabetes Father 67  . Hypertension Sister   . Diabetes Brother   . Hypertension Brother   . Colon cancer Neg Hx   . Heart disease Cousin 66    mi    Allergies  Allergen Reactions  . Codeine Nausea Only    Severe nausea    Current Outpatient Prescriptions on File Prior to Visit  Medication Sig Dispense Refill  . amitriptyline (ELAVIL) 25  MG tablet TAKE TWO TABLETS BY MOUTH ONCE DAILY 60 tablet 5  . atorvastatin (LIPITOR) 80 MG tablet TAKE ONE TABLET BY MOUTH ONCE DAILY 30 tablet 5  . BIOTIN PO Take 1 tablet by mouth daily.     Marland Kitchen buPROPion (WELLBUTRIN XL) 150 MG 24 hr tablet TAKE TWO TABLETS BY MOUTH ONCE DAILY IN THE MORNING 180 tablet 3  . diazepam (VALIUM) 5 MG tablet TAKE ONE TABLET BY MOUTH AT BEDTIME AS NEEDED FOR ANXIETY 30 tablet 2  . glimepiride (AMARYL) 4 MG tablet TAKE 1 TABLET BY MOUTH TWICE A DAY 180 tablet 0  . hydrochlorothiazide (HYDRODIURIL) 25 MG tablet TAKE ONE TABLET BY MOUTH ONCE DAILY 30 tablet 5  . latanoprost (XALATAN) 0.005 % ophthalmic solution Place 1 drop into both eyes at bedtime.     . metFORMIN (GLUCOPHAGE) 850 MG tablet 1 po qam and 2 po qpm 270 tablet 3  . Naproxen Sodium (ALEVE PO)  Take 2 tablets by mouth daily.    Marland Kitchen omeprazole (PRILOSEC) 40 MG capsule Take 1 capsule (40 mg total) by mouth daily. 90 capsule 3   No current facility-administered medications on file prior to visit.     BP (!) 119/57 (BP Location: Right Arm, Patient Position: Sitting, Cuff Size: Large)   Pulse 84   Temp 98.3 F (36.8 C) (Oral)   Resp 16   Ht 5\' 6"  (1.676 m)   Wt 163 lb 8 oz (74.2 kg)   SpO2 99%   BMI 26.39 kg/m       Objective:   Physical Exam  General  Mental Status - Alert. General Appearance - Well groomed. Not in acute distress.  Skin Rashes- No Rashes.  HEENT Head- Normal. Ear Auditory Canal - Left- Normal. Right - Normal.Tympanic Membrane- Left- Normal. Right- Normal. Eye Sclera/Conjunctiva- Left- Normal. Right- Normal. Nose & Sinuses Nasal Mucosa- Left-  Boggy and Congested. Right-  Boggy and  Congested.Bilateral fmild maxillary sinus pressure but no frontal sinus pressure. Mouth & Throat Lips: Upper Lip- Normal: no dryness, cracking, pallor, cyanosis, or vesicular eruption. Lower Lip-Normal: no dryness, cracking, pallor, cyanosis or vesicular eruption. Buccal Mucosa- Bilateral- No Aphthous ulcers. Oropharynx- No Discharge or Erythema. Tonsils: Characteristics- Bilateral- mild redness. Size/Enlargement- Bilateral- No enlargement. Discharge- bilateral-None.  Neck Neck- Supple. No Masses.   Chest and Lung Exam Auscultation: Breath Sounds:-Clear even and unlabored.  Cardiovascular Auscultation:Rythm- Regular, rate and rhythm. Murmurs & Other Heart Sounds:Ausculatation of the heart reveal- No Murmurs.  Lymphatic Head & Neck General Head & Neck Lymphatics: Bilateral: Description- No Localized lymphadenopathy.       Assessment & Plan:  You appear to have early sinus infection(concern for worsening symptoms over weekend and want to avoid unnecessary ED evaluation/exposure to flu over weekend if you worsen). I am prescribing antibiotic for the infection.  To help with the nasal congestion I prescribed flonase nasal steroid. For your associated cough, I prescribed  Benzonatate cough medicine.  For st above antibiotic has good coverage if your negative rapid strep was incorrect.  For any wheezing making albuterol inhaler available.  Rest, hydrate, tylenol for fever.  Follow up in 7 days or as needed.

## 2016-07-28 NOTE — Telephone Encounter (Signed)
Please inform patient that this has been corrected/SLS 02/02

## 2016-07-28 NOTE — Progress Notes (Signed)
Pre visit review using our clinic review tool, if applicable. No additional management support is needed unless otherwise documented below in the visit note/SLS  

## 2016-07-28 NOTE — Patient Instructions (Addendum)
You appear to have early sinus infection(concern for worsening symptoms over weekend and want to avoid unnecessary ED evaluation/exposure to flu over weekend if you worsen). I am prescribing antibiotic for the infection. To help with the nasal congestion I prescribed flonase  nasal steroid. For your associated cough, I prescribed  Benzonatate cough medicine.  For st above antibiotic has good coverage if your negative rapid strep was incorrect.  For any wheezing making albuterol inhaler available.  Rest, hydrate, tylenol for fever.  Follow up in 7 days or as needed.

## 2016-07-28 NOTE — Telephone Encounter (Signed)
Pt has been advised.

## 2016-07-28 NOTE — Telephone Encounter (Signed)
Patient called stating that her medications that were sent in today were sent in to the wrong pharmacy. She needs them to be resent in to [facility], [place] Please advise

## 2016-08-05 ENCOUNTER — Other Ambulatory Visit: Payer: Self-pay | Admitting: Family Medicine

## 2016-08-22 ENCOUNTER — Ambulatory Visit (INDEPENDENT_AMBULATORY_CARE_PROVIDER_SITE_OTHER): Payer: Medicare Other

## 2016-08-22 ENCOUNTER — Ambulatory Visit (AMBULATORY_SURGERY_CENTER): Payer: Self-pay

## 2016-08-22 VITALS — Ht 65.75 in | Wt 162.8 lb

## 2016-08-22 DIAGNOSIS — E538 Deficiency of other specified B group vitamins: Secondary | ICD-10-CM

## 2016-08-22 DIAGNOSIS — Z1211 Encounter for screening for malignant neoplasm of colon: Secondary | ICD-10-CM

## 2016-08-22 MED ORDER — SUPREP BOWEL PREP KIT 17.5-3.13-1.6 GM/177ML PO SOLN: 1.0000 | Freq: Once | ORAL | 0 refills | Status: AC

## 2016-08-22 MED ORDER — CYANOCOBALAMIN 1000 MCG/ML IJ SOLN
1000.0000 ug | Freq: Once | INTRAMUSCULAR | Status: AC
  Administered 2016-08-22: 1000 ug via INTRAMUSCULAR

## 2016-08-22 NOTE — Progress Notes (Signed)
No allergies to eggs or soy No past problems with anesthesia No diet meds No home oxygen  Declined emmi 

## 2016-08-22 NOTE — Progress Notes (Signed)
Pre visit review using our clinic tool,if applicable. No additional management support is needed unless otherwise documented below in the visit note.   Patient in for B12 injection per order from Dr. Roma Schanz due to B12 difeciency.  Given 1000 mcg/ml  IM Right deltoid. Patient tolerated well.  Medications taken as ordered. No complaints voiced. Appointment scheduled for next injection.

## 2016-09-05 ENCOUNTER — Ambulatory Visit (AMBULATORY_SURGERY_CENTER): Payer: Medicare Other | Admitting: Gastroenterology

## 2016-09-05 ENCOUNTER — Encounter: Payer: Self-pay | Admitting: Gastroenterology

## 2016-09-05 VITALS — BP 146/63 | HR 66 | Temp 96.2°F | Resp 16 | Ht 65.0 in | Wt 162.0 lb

## 2016-09-05 DIAGNOSIS — D125 Benign neoplasm of sigmoid colon: Secondary | ICD-10-CM

## 2016-09-05 DIAGNOSIS — Z1211 Encounter for screening for malignant neoplasm of colon: Secondary | ICD-10-CM | POA: Diagnosis present

## 2016-09-05 DIAGNOSIS — Z1212 Encounter for screening for malignant neoplasm of rectum: Secondary | ICD-10-CM | POA: Diagnosis not present

## 2016-09-05 DIAGNOSIS — I10 Essential (primary) hypertension: Secondary | ICD-10-CM | POA: Diagnosis not present

## 2016-09-05 MED ORDER — SODIUM CHLORIDE 0.9 % IV SOLN
500.0000 mL | INTRAVENOUS | Status: DC
Start: 2016-09-05 — End: 2017-10-24

## 2016-09-05 NOTE — Progress Notes (Signed)
Report to PACU, RN, vss, BBS= Clear.  

## 2016-09-05 NOTE — Progress Notes (Signed)
Called to room to assist during endoscopic procedure.  Patient ID and intended procedure confirmed with present staff. Received instructions for my participation in the procedure from the performing physician.  

## 2016-09-05 NOTE — Progress Notes (Signed)
Pt's states no medical or surgical changes since previsit or office visit. 

## 2016-09-05 NOTE — Patient Instructions (Addendum)
YOU HAD AN ENDOSCOPIC PROCEDURE TODAY AT [facility]:   Refer to the procedure report that was given to you for any specific questions about what was found during the examination.  If the procedure report does not answer your questions, please call your gastroenterologist to clarify.  If you requested that your care partner not be given the details of your procedure findings, then the procedure report has been included in a sealed envelope for you to review at your convenience later.  YOU SHOULD EXPECT: Some feelings of bloating in the abdomen. Passage of more gas than usual.  Walking can help get rid of the air that was put into your GI tract during the procedure and reduce the bloating. If you had a lower endoscopy (such as a colonoscopy or flexible sigmoidoscopy) you may notice spotting of blood in your stool or on the toilet paper. If you underwent a bowel prep for your procedure, you may not have a normal bowel movement for a few days.  Please Note:  You might notice some irritation and congestion in your nose or some drainage.  This is from the oxygen used during your procedure.  There is no need for concern and it should clear up in a day or so.  SYMPTOMS TO REPORT IMMEDIATELY:   Following lower endoscopy (colonoscopy or flexible sigmoidoscopy):  Excessive amounts of blood in the stool  Significant tenderness or worsening of abdominal pains  Swelling of the abdomen that is new, acute  Fever of 100F or high   For urgent or emergent issues, a gastroenterologist can be reached at any hour by calling [phone].   DIET:  We do recommend a small meal at first, but then you may proceed to your regular diet.  Drink plenty of fluids but you should avoid alcoholic beverages for 24 hours.  ACTIVITY:  You should plan to take it easy for the rest of today and you should NOT DRIVE or use heavy machinery until tomorrow (because of the sedation medicines used during the test).     FOLLOW UP: Our staff will call the number listed on your records the next business day following your procedure to check on you and address any questions or concerns that you may have regarding the information given to you following your procedure. If we do not reach you, we will leave a message.  However, if you are feeling well and you are not experiencing any problems, there is no need to return our call.  We will assume that you have returned to your regular daily activities without incident.  If any biopsies were taken you will be contacted by phone or by letter within the next 1-3 weeks.  Please call us at [phone] if you have not heard about the biopsies in 3 weeks.    SIGNATURES/CONFIDENTIALITY: You and/or your care partner have signed paperwork which will be entered into your electronic medical record.  These signatures attest to the fact that that the information above on your After Visit Summary has been reviewed and is understood.  Full responsibility of the confidentiality of this discharge information lies with you and/or your care-partner.  Polyp information given. Diverticulosis information given.

## 2016-09-05 NOTE — Op Note (Addendum)
[facility] Patient Name: Alyssa Ford Procedure Date: 09/05/2016 1:18 PM MRN: [id number] Endoscopist: Mauri Pole , MD Age: 66 Referring MD:  Date of Birth: [birth year] Gender: Female Account #: [id number] Procedure:                Colonoscopy Indications:              Screening for colorectal malignant neoplasm, Last                            colonoscopy: 2008 Medicines:                Monitored Anesthesia Care Procedure:                Pre-Anesthesia Assessment:                           - Prior to the procedure, a History and Physical                            was performed, and patient medications and                            allergies were reviewed. The patient's tolerance of                            previous anesthesia was also reviewed. The risks                            and benefits of the procedure and the sedation                            options and risks were discussed with the patient.                            All questions were answered, and informed consent                            was obtained. Prior Anticoagulants: The patient has                            taken no previous anticoagulant or antiplatelet                            agents. ASA Grade Assessment: II - A patient with                            mild systemic disease. After reviewing the risks                            and benefits, the patient was deemed in                            satisfactory condition to undergo the procedure.  After obtaining informed consent, the colonoscope                            was passed under direct vision. Throughout the                            procedure, the patient's blood pressure, pulse, and                            oxygen saturations were monitored continuously. The                            Colonoscope was introduced through the anus and                            advanced to the the terminal ileum,  with                            identification of the appendiceal orifice and IC                            valve. The colonoscopy was performed without                            difficulty. The patient tolerated the procedure                            well. The quality of the bowel preparation was                            fair. The terminal ileum, ileocecal valve,                            appendiceal orifice, and rectum were photographed.                            The quality of the bowel preparation was evaluated                            using the BBPS [facility] Bowel Preparation Scale)                            with scores of: Right Colon = 3 (entire mucosa seen                            well with no residual staining, small fragments of                            stool or opaque liquid), Transverse Colon = 2                            (minor amount of residual staining, small fragments  of stool and/or opaque liquid, but mucosa seen                            well) and Left Colon = 1 (portion of mucosa seen,                            but other areas not well seen due to staining,                            residual stool and/or opaque liquid). The total                            BBPS score equals 6. The quality of the bowel                            preparation was fair. Scope In: 1:29:23 PM Scope Out: 2:02:50 PM Scope Withdrawal Time: 0 hours 16 minutes 22 seconds  Total Procedure Duration: 0 hours 33 minutes 27 seconds  Findings:                 The perianal and digital rectal examinations were                            normal.                           A 5 mm polyp was found in the sigmoid colon. The                            polyp was sessile. The polyp was removed with a                            cold snare. Resection and retrieval were complete.                           Multiple small-mouthed diverticula were found in                             the sigmoid colon and descending colon. Complications:            No immediate complications. Estimated Blood Loss:     Estimated blood loss was minimal. Impression:               - Preparation of the colon was fair.                           - One 5 mm polyp in the sigmoid colon, removed with                            a cold snare. Resected and retrieved.                           - Diverticulosis in the sigmoid colon and in the  descending colon. Recommendation:           - Patient has a contact number available for                            emergencies. The signs and symptoms of potential                            delayed complications were discussed with the                            patient. Return to normal activities tomorrow.                            Written discharge instructions were provided to the                            patient.                           - Resume previous diet.                           - Continue present medications.                           - Await pathology results.                           - Repeat colonoscopy in 1 year because the bowel                            preparation was suboptimal.                           - Return to GI clinic PRN. Mauri Pole, MD 09/05/2016 2:12:33 PM This report has been signed electronically.

## 2016-09-06 ENCOUNTER — Telehealth: Payer: Self-pay | Admitting: *Deleted

## 2016-09-06 NOTE — Telephone Encounter (Signed)
  Follow up Call-  Call back number 09/05/2016  Post procedure Call Back phone  # [phone]  Permission to leave phone message Yes  Some recent data might be hidden     Patient questions:  Do you have a fever, pain , or abdominal swelling? No. Pain Score  0 *  Have you tolerated food without any problems? Yes.    Have you been able to return to your normal activities? Yes.    Do you have any questions about your discharge instructions: Diet   No. Medications  No. Follow up visit  No.  Do you have questions or concerns about your Care? No.  Actions: * If pain score is 4 or above: No action needed, pain <4.

## 2016-09-08 ENCOUNTER — Encounter: Payer: Self-pay | Admitting: Gastroenterology

## 2016-09-12 ENCOUNTER — Telehealth: Payer: Self-pay | Admitting: Gastroenterology

## 2016-09-12 NOTE — Telephone Encounter (Signed)
The patient denies symptoms of constipation. She has used 2 enemas and a dose of Milk Of Magnesia. She says none of these has produced much stool. She is eating a normal diet with fresh fruit and "lots of water". She does not have nausea or abdominal pain. She passes gas. Please advise.

## 2016-09-12 NOTE — Telephone Encounter (Signed)
Sometimes after bowel prep, it can take few weeks to get back to baseline. Please advise patient to continue with her diet with vegetable and fruits. Water 8-10 cups daily.  Can try Probiotic to help with excessive flatus, VSL#3 1 capsule daily for 2-4 weeks. Thanks

## 2016-09-18 ENCOUNTER — Other Ambulatory Visit: Payer: Self-pay | Admitting: *Deleted

## 2016-09-18 MED ORDER — ATORVASTATIN CALCIUM 80 MG PO TABS: 80.0000 mg | ORAL_TABLET | Freq: Every day | ORAL | 0 refills | Status: DC

## 2016-09-18 NOTE — Telephone Encounter (Signed)
Pharmacy requested a 90 day supply.

## 2016-09-19 ENCOUNTER — Ambulatory Visit: Payer: Medicare Other

## 2016-09-21 ENCOUNTER — Ambulatory Visit (INDEPENDENT_AMBULATORY_CARE_PROVIDER_SITE_OTHER): Payer: Medicare Other | Admitting: Behavioral Health

## 2016-09-21 DIAGNOSIS — E538 Deficiency of other specified B group vitamins: Secondary | ICD-10-CM | POA: Diagnosis not present

## 2016-09-21 MED ORDER — CYANOCOBALAMIN 1000 MCG/ML IJ SOLN
1000.0000 ug | Freq: Once | INTRAMUSCULAR | Status: AC
  Administered 2016-09-21: 1000 ug via INTRAMUSCULAR

## 2016-09-21 NOTE — Progress Notes (Signed)
Pre visit review using our clinic review tool, if applicable. No additional management support is needed unless otherwise documented below in the visit note.  Patient in clinic today for B12 injection. IM injection given in right deltoid. Patient tolerated it well. Next appointment scheduled for 10/24/16 at 9:45 AM.

## 2016-10-24 ENCOUNTER — Ambulatory Visit (INDEPENDENT_AMBULATORY_CARE_PROVIDER_SITE_OTHER): Payer: Medicare Other

## 2016-10-24 DIAGNOSIS — E538 Deficiency of other specified B group vitamins: Secondary | ICD-10-CM | POA: Diagnosis not present

## 2016-10-24 MED ORDER — CYANOCOBALAMIN 1000 MCG/ML IJ SOLN
1000.0000 ug | Freq: Once | INTRAMUSCULAR | Status: AC
  Administered 2016-10-24: 1000 ug via INTRAMUSCULAR

## 2016-10-24 NOTE — Progress Notes (Signed)
Pre visit review using our clinic tool,if applicable. No additional management support is needed unless otherwise documented below in the visit note.   Patient in for B12 injection per order from Dr. Roma Schanz.   No complaints voiced this visit. All medication taken as ordered.   Given B12 1000 mg IM Right deltoid. Patient tolerated well. Return appointment given for 1 month.

## 2016-10-27 ENCOUNTER — Ambulatory Visit (INDEPENDENT_AMBULATORY_CARE_PROVIDER_SITE_OTHER): Payer: Medicare Other | Admitting: Family Medicine

## 2016-10-27 ENCOUNTER — Other Ambulatory Visit (HOSPITAL_COMMUNITY)
Admission: RE | Admit: 2016-10-27 | Discharge: 2016-10-27 | Disposition: A | Discharge status: Home / Self Care | Payer: Medicare Other | Source: Ambulatory Visit | Attending: Family Medicine | Admitting: Family Medicine

## 2016-10-27 ENCOUNTER — Encounter: Payer: Self-pay | Admitting: Family Medicine

## 2016-10-27 VITALS — BP 141/62 | HR 86 | Temp 98.2°F | Wt 162.4 lb

## 2016-10-27 DIAGNOSIS — N390 Urinary tract infection, site not specified: Secondary | ICD-10-CM | POA: Diagnosis not present

## 2016-10-27 DIAGNOSIS — F411 Generalized anxiety disorder: Secondary | ICD-10-CM | POA: Diagnosis not present

## 2016-10-27 LAB — POCT URINALYSIS DIPSTICK
Bilirubin, UA: NEGATIVE
Blood, UA: NEGATIVE
Glucose, UA: NEGATIVE
Ketones, UA: NEGATIVE
Leukocytes, UA: NEGATIVE
Nitrite, UA: NEGATIVE
Protein, UA: NEGATIVE
Spec Grav, UA: 1.015 (ref 1.010–1.025)
Urobilinogen, UA: NEGATIVE E.U./dL — AB
pH, UA: 6 (ref 5.0–8.0)

## 2016-10-27 MED ORDER — DIAZEPAM 5 MG PO TABS: ORAL_TABLET | ORAL | 2 refills | Status: DC

## 2016-10-27 NOTE — Progress Notes (Signed)
Pre visit review using our clinic review tool, if applicable. No additional management support is needed unless otherwise documented below in the visit note. 

## 2016-10-27 NOTE — Patient Instructions (Signed)

## 2016-10-27 NOTE — Telephone Encounter (Signed)
error:[id number] ° °

## 2016-10-27 NOTE — Progress Notes (Signed)
Patient ID: Alyssa Ford, female   DOB: 1950-09-12, 66 y.o.   MRN: [id number]   Subjective:  I acted as a Education administrator for [employer], DO. [place], [place]   Patient ID: Alyssa Ford, female    DOB: 1951/03/03, 66 y.o.   MRN: [id number]  Chief Complaint  Patient presents with  . Urinary Tract Infection    Has noticed a "fish" smell to her urine for the past 2-3 weeks.    Urinary Tract Infection   This is a new problem. The current episode started 1 to 4 weeks ago. The problem occurs every urination. The problem has been unchanged. The patient is experiencing no pain. There is no history of pyelonephritis. Pertinent negatives include no vomiting. Associated symptoms comments: States she been experiencing a fish smell .    Patient is in today for a possible UTI. Patient states that for the past 2-3 weeks she has noticed a fish smell with her urination. Denies any burning, itching, frequency, urgency, fever or pain. Patient has no Hx of frequent UTIs. Patient has a Hx of HTN, Type II Diabetes, GERD, concerns noted at this time. fatigue, headache. Patient has no additional acute noted at this time.  Patient Care Team: Ann Held, DO as PCP - General Devra Dopp, MD as Referring Physician (Dermatology)   Past Medical History:  Diagnosis Date  . Anemia   . Arthritis   . Cataract   . Chronic headaches   . Diabetes mellitus   . Diverticulosis of colon (without mention of hemorrhage) 2003, 2008   Colonoscopy  . GERD (gastroesophageal reflux disease)   . Glaucoma   . Hiatal hernia 2003   EGD   . Hyperlipidemia   . Hypertension   . Thyroid nodule     Past Surgical History:  Procedure Laterality Date  . CARPAL TUNNEL RELEASE  2006  . CARPAL TUNNEL RELEASE  12/24/2007  . CERVICAL FUSION  3/08  . CHOLECYSTECTOMY      Family History  Problem Relation Age of Onset  . Dementia Mother   . Breast cancer Mother 47  . Kidney failure Mother 3    died from  kidney failure from abx given to her for pneumonia  . Hyperlipidemia Mother   . Hypertension Mother   . Diabetes Mother   . Cancer Mother 14    breast  . Parkinsonism Father   . Diabetes Father 28  . Hypertension Sister   . Diabetes Brother   . Hypertension Brother   . Heart disease Cousin 29    mi  . Colon cancer Neg Hx     Social History   Social History  . Marital status: Married    Spouse name: N/A  . Number of children: N/A  . Years of education: N/A   Occupational History  . retired    Social History Main Topics  . Smoking status: Former Smoker    Packs/day: 1.40    Years: 49.00    Types: Cigarettes    Quit date: 07/16/2015  . Smokeless tobacco: Never Used     Comment: e cig  . Alcohol use 0.0 oz/week     Comment: once weekly; wine and beer  . Drug use: No  . Sexual activity: Yes    Partners: Male   Other Topics Concern  . Not on file   Social History Narrative   Exercise--  walk    Outpatient Medications Prior to Visit  Medication Sig Dispense  Refill  . albuterol (PROVENTIL HFA;VENTOLIN HFA) 108 (90 Base) MCG/ACT inhaler Inhale 2 puffs into the lungs every 6 (six) hours as needed for wheezing or shortness of breath. 1 Inhaler 0  . amitriptyline (ELAVIL) 25 MG tablet TAKE TWO TABLETS BY MOUTH ONCE DAILY 60 tablet 5  . atorvastatin (LIPITOR) 80 MG tablet Take 1 tablet (80 mg total) by mouth daily. 90 tablet 0  . BIOTIN PO Take 1 tablet by mouth daily.     Marland Kitchen buPROPion (WELLBUTRIN XL) 150 MG 24 hr tablet TAKE TWO TABLETS BY MOUTH ONCE DAILY IN THE MORNING 180 tablet 3  . glimepiride (AMARYL) 4 MG tablet TAKE 1 TABLET BY MOUTH TWICE A DAY 180 tablet 0  . hydrochlorothiazide (HYDRODIURIL) 25 MG tablet TAKE ONE TABLET BY MOUTH ONCE DAILY 30 tablet 5  . latanoprost (XALATAN) 0.005 % ophthalmic solution Place 1 drop into both eyes at bedtime.     . metFORMIN (GLUCOPHAGE) 850 MG tablet 1 po qam and 2 po qpm 270 tablet 3  . Naproxen Sodium (ALEVE PO) Take 2  tablets by mouth daily.    Marland Kitchen omeprazole (PRILOSEC) 40 MG capsule Take 1 capsule (40 mg total) by mouth daily. 90 capsule 3  . diazepam (VALIUM) 5 MG tablet TAKE ONE TABLET BY MOUTH AT BEDTIME AS NEEDED FOR ANXIETY 30 tablet 2   Facility-Administered Medications Prior to Visit  Medication Dose Route Frequency Provider Last Rate Last Dose  . 0.9 %  sodium chloride infusion  500 mL Intravenous Continuous Mauri Pole, MD        Allergies  Allergen Reactions  . Codeine Nausea Only    Severe nausea    Review of Systems  Constitutional: Negative for fever and malaise/fatigue.  HENT: Negative for congestion.   Eyes: Negative for blurred vision.  Respiratory: Negative for cough and shortness of breath.   Cardiovascular: Negative for chest pain, palpitations and leg swelling.  Gastrointestinal: Negative for vomiting.  Musculoskeletal: Negative for back pain.  Skin: Negative for rash.  Neurological: Negative for loss of consciousness and headaches.       Objective:    Physical Exam  Constitutional: She is oriented to person, place, and time. She appears well-developed and well-nourished. No distress.  HENT:  Head: Normocephalic and atraumatic.  Eyes: Conjunctivae are normal.  Neck: Normal range of motion. No thyromegaly present.  Cardiovascular: Normal rate and regular rhythm.   Pulmonary/Chest: Effort normal and breath sounds normal. She has no wheezes.  Abdominal: Soft. Bowel sounds are normal. There is no tenderness.  Musculoskeletal: She exhibits no edema or deformity.  Neurological: She is alert and oriented to person, place, and time.  Skin: Skin is warm and dry. She is not diaphoretic.  Psychiatric: She has a normal mood and affect.    BP (!) 141/62 (BP Location: Left Arm, Patient Position: Sitting, Cuff Size: Large)   Pulse 86   Temp 98.2 F (36.8 C) (Oral)   Wt 162 lb 6.4 oz (73.7 kg)   SpO2 98% Comment: RA  BMI 27.02 kg/m  Wt Readings from Last 3 Encounters:   10/27/16 162 lb 6.4 oz (73.7 kg)  09/05/16 162 lb (73.5 kg)  08/22/16 162 lb 12.8 oz (73.8 kg)   BP Readings from Last 3 Encounters:  10/27/16 (!) 141/62  09/05/16 (!) 146/63  07/28/16 (!) 119/57     Immunization History  Administered Date(s) Administered  . Influenza Split 04/13/2011  . Influenza Whole 04/17/2006  . Influenza, High Dose Seasonal  PF 04/12/2016  . Influenza, Seasonal, Injecte, Preservative Fre 05/22/2012  . Influenza,inj,Quad PF,36+ Mos 03/03/2014, 03/18/2015  . Pneumococcal Conjugate-13 09/13/2015  . Pneumococcal Polysaccharide-23 03/31/2003, 11/03/2008  . Td 10/24/1999, 02/23/2010  . Zoster 12/25/2011    Health Maintenance  Topic Date Due  . FOOT EXAM  09/12/2016  . HEMOGLOBIN A1C  10/26/2016  . PNA vac Low Risk Adult (2 of 2 - PPSV23) 01/27/2017 (Originally 09/12/2016)  . OPHTHALMOLOGY EXAM  01/04/2017  . INFLUENZA VACCINE  01/24/2017  . URINE MICROALBUMIN  04/28/2017  . COLONOSCOPY  09/11/2017  . MAMMOGRAM  10/11/2017  . TETANUS/TDAP  02/24/2020  . DEXA SCAN  Completed  . Hepatitis C Screening  Completed    Lab Results  Component Value Date   WBC 10.1 09/21/2015   HGB 10.7 (L) 09/21/2015   HCT 33.5 (L) 09/21/2015   PLT 331.0 09/21/2015   GLUCOSE 107 (H) 04/28/2016   CHOL 127 04/28/2016   TRIG 142.0 04/28/2016   HDL 41.50 04/28/2016   LDLDIRECT 197.0 08/25/2011   LDLCALC 57 04/28/2016   ALT 20 04/28/2016   AST 14 04/28/2016   NA 137 04/28/2016   K 3.9 04/28/2016   CL 96 04/28/2016   CREATININE 0.95 04/28/2016   BUN 20 04/28/2016   CO2 30 04/28/2016   TSH 2.89 03/18/2015   HGBA1C 7.1 (H) 04/28/2016   MICROALBUR 1.4 04/28/2016    Lab Results  Component Value Date   TSH 2.89 03/18/2015   Lab Results  Component Value Date   WBC 10.1 09/21/2015   HGB 10.7 (L) 09/21/2015   HCT 33.5 (L) 09/21/2015   MCV 76.4 (L) 09/21/2015   PLT 331.0 09/21/2015   Lab Results  Component Value Date   NA 137 04/28/2016   K 3.9 04/28/2016   CO2  30 04/28/2016   GLUCOSE 107 (H) 04/28/2016   BUN 20 04/28/2016   CREATININE 0.95 04/28/2016   BILITOT 0.3 04/28/2016   ALKPHOS 84 04/28/2016   AST 14 04/28/2016   ALT 20 04/28/2016   PROT 7.3 04/28/2016   ALBUMIN 4.4 04/28/2016   CALCIUM 9.8 04/28/2016   GFR 62.53 04/28/2016   Lab Results  Component Value Date   CHOL 127 04/28/2016   Lab Results  Component Value Date   HDL 41.50 04/28/2016   Lab Results  Component Value Date   LDLCALC 57 04/28/2016   Lab Results  Component Value Date   TRIG 142.0 04/28/2016   Lab Results  Component Value Date   CHOLHDL 3 04/28/2016   Lab Results  Component Value Date   HGBA1C 7.1 (H) 04/28/2016         Assessment & Plan:   Problem List Items Addressed This Visit    None    Visit Diagnoses    Urinary tract infection without hematuria, site unspecified    -  Primary   Relevant Orders   POCT Urinalysis Dipstick   Generalized anxiety disorder       Relevant Medications   diazepam (VALIUM) 5 MG tablet    will do urine ancillary for bv, yeast   I am having Ms. Belkin maintain her BIOTIN PO, Naproxen Sodium (ALEVE PO), latanoprost, omeprazole, hydrochlorothiazide, metFORMIN, buPROPion, glimepiride, albuterol, amitriptyline, atorvastatin, and diazepam. We will continue to administer sodium chloride.  Meds ordered this encounter  Medications  . diazepam (VALIUM) 5 MG tablet    Sig: TAKE ONE TABLET BY MOUTH AT BEDTIME AS NEEDED FOR ANXIETY    Dispense:  30 tablet  Refill:  2     Ann Held, DO

## 2016-10-30 ENCOUNTER — Other Ambulatory Visit: Payer: Self-pay | Admitting: Family Medicine

## 2016-10-30 MED ORDER — GLIMEPIRIDE 4 MG PO TABS: 4.0000 mg | ORAL_TABLET | Freq: Two times a day (BID) | ORAL | 0 refills | Status: DC

## 2016-11-02 LAB — URINE CYTOLOGY ANCILLARY ONLY
Bacterial vaginitis: NEGATIVE
Candida vaginitis: NEGATIVE

## 2016-11-06 ENCOUNTER — Encounter: Payer: Medicare Other | Admitting: Family Medicine

## 2016-11-23 ENCOUNTER — Encounter: Payer: Self-pay | Admitting: Family Medicine

## 2016-11-23 ENCOUNTER — Ambulatory Visit (INDEPENDENT_AMBULATORY_CARE_PROVIDER_SITE_OTHER): Payer: Medicare Other | Admitting: Family Medicine

## 2016-11-23 VITALS — BP 125/71 | HR 77 | Temp 98.2°F | Resp 16 | Ht 65.0 in | Wt 162.8 lb

## 2016-11-23 DIAGNOSIS — Z79899 Other long term (current) drug therapy: Secondary | ICD-10-CM | POA: Diagnosis not present

## 2016-11-23 DIAGNOSIS — E1151 Type 2 diabetes mellitus with diabetic peripheral angiopathy without gangrene: Secondary | ICD-10-CM

## 2016-11-23 DIAGNOSIS — E785 Hyperlipidemia, unspecified: Secondary | ICD-10-CM

## 2016-11-23 DIAGNOSIS — Z0000 Encounter for general adult medical examination without abnormal findings: Secondary | ICD-10-CM

## 2016-11-23 DIAGNOSIS — E538 Deficiency of other specified B group vitamins: Secondary | ICD-10-CM

## 2016-11-23 DIAGNOSIS — I10 Essential (primary) hypertension: Secondary | ICD-10-CM

## 2016-11-23 LAB — CBC WITH DIFFERENTIAL/PLATELET
Basophils Absolute: 0.1 10*3/uL (ref 0.0–0.1)
Basophils Relative: 0.9 % (ref 0.0–3.0)
Eosinophils Absolute: 0.2 10*3/uL (ref 0.0–0.7)
Eosinophils Relative: 2.2 % (ref 0.0–5.0)
HCT: 31.5 % — ABNORMAL LOW (ref 36.0–46.0)
Hemoglobin: 10.3 g/dL — ABNORMAL LOW (ref 12.0–15.0)
Lymphocytes Relative: 31.6 % (ref 12.0–46.0)
Lymphs Abs: 2.9 10*3/uL (ref 0.7–4.0)
MCHC: 32.6 g/dL (ref 30.0–36.0)
MCV: 73.8 fl — ABNORMAL LOW (ref 78.0–100.0)
Monocytes Absolute: 0.5 10*3/uL (ref 0.1–1.0)
Monocytes Relative: 5.7 % (ref 3.0–12.0)
Neutro Abs: 5.5 10*3/uL (ref 1.4–7.7)
Neutrophils Relative %: 59.6 % (ref 43.0–77.0)
Platelets: 348 10*3/uL (ref 150.0–400.0)
RBC: 4.27 Mil/uL (ref 3.87–5.11)
RDW: 17.7 % — ABNORMAL HIGH (ref 11.5–15.5)
WBC: 9.3 10*3/uL (ref 4.0–10.5)

## 2016-11-23 LAB — LIPID PANEL
Cholesterol: 133 mg/dL (ref 0–200)
HDL: 45.7 mg/dL (ref >39.00)
LDL Cholesterol: 66 mg/dL (ref 0–99)
NonHDL: 87.38
Total CHOL/HDL Ratio: 3
Triglycerides: 106 mg/dL (ref 0.0–149.0)
VLDL: 21.2 mg/dL (ref 0.0–40.0)

## 2016-11-23 LAB — GLUCOSE, POCT (MANUAL RESULT ENTRY): POC Glucose: 174 mg/dl — AB (ref 70–99)

## 2016-11-23 LAB — COMPREHENSIVE METABOLIC PANEL
ALT: 25 U/L (ref 0–35)
AST: 17 U/L (ref 0–37)
Albumin: 4.4 g/dL (ref 3.5–5.2)
Alkaline Phosphatase: 96 U/L (ref 39–117)
BUN: 15 mg/dL (ref 6–23)
CO2: 32 mEq/L (ref 19–32)
Calcium: 9.6 mg/dL (ref 8.4–10.5)
Chloride: 96 mEq/L (ref 96–112)
Creatinine, Ser: 0.82 mg/dL (ref 0.40–1.20)
GFR: 73.98 mL/min (ref >60.00)
Glucose, Bld: 138 mg/dL — ABNORMAL HIGH (ref 70–99)
Potassium: 3.6 mEq/L (ref 3.5–5.1)
Sodium: 137 mEq/L (ref 135–145)
Total Bilirubin: 0.3 mg/dL (ref 0.2–1.2)
Total Protein: 7.4 g/dL (ref 6.0–8.3)

## 2016-11-23 LAB — VITAMIN B12: Vitamin B-12: >1500 pg/mL — ABNORMAL HIGH (ref 211–911)

## 2016-11-23 LAB — HEMOGLOBIN A1C: Hgb A1c MFr Bld: 7.4 % — ABNORMAL HIGH (ref 4.6–6.5)

## 2016-11-23 MED ORDER — CYANOCOBALAMIN 1000 MCG/ML IJ SOLN
1000.0000 ug | Freq: Once | INTRAMUSCULAR | Status: AC
  Administered 2016-11-23: 1000 ug via INTRAMUSCULAR

## 2016-11-23 NOTE — Progress Notes (Signed)
Subjective:     Alyssa Ford is a 66 y.o. female and is here for a comprehensive physical exam. The patient reports no problems.    HPI HYPERTENSION   Blood pressure range-not checking   Chest pain- no      Dyspnea- no Lightheadedness- no   Edema- no  Other side effects - no   Medication compliance: good Low salt diet- yes    DIABETES    Blood Sugar ranges-90-170  Polyuria- no New Visual problems- no  Hypoglycemic symptoms- no  Other side effects-no Medication compliance - good Last eye exam- 07/2016 Foot exam- today   HYPERLIPIDEMIA  Medication compliance- good RUQ pain- no  Muscle aches- no Other side effects-no     Social History   Social History  . Marital status: Married    Spouse name: N/A  . Number of children: N/A  . Years of education: N/A   Occupational History  . retired    Social History Main Topics  . Smoking status: Former Smoker    Packs/day: 1.40    Years: 49.00    Types: Cigarettes    Quit date: 07/16/2015  . Smokeless tobacco: Never Used     Comment: e cig  . Alcohol use 0.0 oz/week     Comment: once weekly; wine and beer  . Drug use: No  . Sexual activity: Yes    Partners: Male   Other Topics Concern  . Not on file   Social History Narrative   Exercise--  walk   Health Maintenance  Topic Date Due  . FOOT EXAM  09/12/2016  . HEMOGLOBIN A1C  10/26/2016  . PNA vac Low Risk Adult (2 of 2 - PPSV23) 01/27/2017 (Originally 09/12/2016)  . OPHTHALMOLOGY EXAM  01/04/2017  . INFLUENZA VACCINE  01/24/2017  . URINE MICROALBUMIN  04/28/2017  . COLONOSCOPY  09/11/2017  . MAMMOGRAM  10/11/2017  . TETANUS/TDAP  02/24/2020  . DEXA SCAN  Completed  . Hepatitis C Screening  Completed    The following portions of the patient's history were reviewed and updated as appropriate:  She  has a past medical history of Anemia; Arthritis; Cataract; Chronic headaches; Diabetes mellitus; Diverticulosis of colon (without mention of hemorrhage) (2003,  2008); GERD (gastroesophageal reflux disease); Glaucoma; Hiatal hernia (2003); Hyperlipidemia; Hypertension; and Thyroid nodule. She  does not have any pertinent problems on file. She  has a past surgical history that includes Cholecystectomy; Carpal tunnel release (2006); Carpal tunnel release (12/24/2007); and Cervical fusion (3/08). Her family history includes Breast cancer (age of onset: 104) in her mother; Cancer (age of onset: 65) in her mother; Dementia in her mother; Diabetes in her brother and mother; Diabetes (age of onset: 61) in her father; Heart disease (age of onset: 1) in her cousin; Hyperlipidemia in her mother; Hypertension in her brother, mother, and sister; Kidney failure (age of onset: 78) in her mother; Parkinsonism in her father. She  reports that she quit smoking about 16 months ago. Her smoking use included Cigarettes. She has a 68.60 pack-year smoking history. She has never used smokeless tobacco. She reports that she drinks alcohol. She reports that she does not use drugs. She has a current medication list which includes the following prescription(s): amitriptyline, atorvastatin, biotin, bupropion, diazepam, glimepiride, hydrochlorothiazide, latanoprost, metformin, naproxen sodium, and omeprazole, and the following Facility-Administered Medications: sodium chloride. Current Outpatient Prescriptions on File Prior to Visit  Medication Sig Dispense Refill  . amitriptyline (ELAVIL) 25 MG tablet TAKE TWO TABLETS BY MOUTH  ONCE DAILY 60 tablet 5  . atorvastatin (LIPITOR) 80 MG tablet Take 1 tablet (80 mg total) by mouth daily. 90 tablet 0  . BIOTIN PO Take 1 tablet by mouth daily.     Marland Kitchen buPROPion (WELLBUTRIN XL) 150 MG 24 hr tablet TAKE TWO TABLETS BY MOUTH ONCE DAILY IN THE MORNING 180 tablet 3  . diazepam (VALIUM) 5 MG tablet TAKE ONE TABLET BY MOUTH AT BEDTIME AS NEEDED FOR ANXIETY 30 tablet 2  . glimepiride (AMARYL) 4 MG tablet Take 1 tablet (4 mg total) by mouth 2 (two) times  daily. 180 tablet 0  . hydrochlorothiazide (HYDRODIURIL) 25 MG tablet TAKE ONE TABLET BY MOUTH ONCE DAILY 30 tablet 8  . latanoprost (XALATAN) 0.005 % ophthalmic solution Place 1 drop into both eyes at bedtime.     . metFORMIN (GLUCOPHAGE) 850 MG tablet 1 po qam and 2 po qpm 270 tablet 3  . Naproxen Sodium (ALEVE PO) Take 2 tablets by mouth daily.    Marland Kitchen omeprazole (PRILOSEC) 40 MG capsule Take 1 capsule (40 mg total) by mouth daily. 90 capsule 3   Current Facility-Administered Medications on File Prior to Visit  Medication Dose Route Frequency Provider Last Rate Last Dose  . 0.9 %  sodium chloride infusion  500 mL Intravenous Continuous Nandigam, Kavitha V, MD       She is allergic to codeine..  Review of Systems Review of Systems  Constitutional: Negative for activity change, appetite change and fatigue.  HENT: Negative for hearing loss, congestion, tinnitus and ear discharge.  dentist q7m Eyes: Negative for visual disturbance (see optho q1y -- vision corrected to 20/20 with glasses).  Respiratory: Negative for cough, chest tightness and shortness of breath.   Cardiovascular: Negative for chest pain, palpitations and leg swelling.  Gastrointestinal: Negative for abdominal pain, diarrhea, constipation and abdominal distention.  Genitourinary: Negative for urgency, frequency, decreased urine volume and difficulty urinating.  Musculoskeletal: Negative for back pain, arthralgias and gait problem.  Skin: Negative for color change, pallor and rash.  Neurological: Negative for dizziness, light-headedness, numbness and headaches.  Hematological: Negative for adenopathy. Does not bruise/bleed easily.  Psychiatric/Behavioral: Negative for suicidal ideas, confusion, sleep disturbance, self-injury, dysphoric mood, decreased concentration and agitation.        Objective:    BP 125/71 (BP Location: Right Arm, Cuff Size: Normal)   Pulse 77   Temp 98.2 F (36.8 C) (Oral)   Resp 16   Ht 5\' 5"   (1.651 m)   Wt 162 lb 12.8 oz (73.8 kg)   SpO2 98%   BMI 27.09 kg/m  General appearance: alert, cooperative, appears stated age and no distress Head: Normocephalic, without obvious abnormality, atraumatic Eyes: conjunctivae/corneas clear. PERRL, EOM's intact. Fundi benign. Ears: normal TM's and external ear canals both ears Nose: Nares normal. Septum midline. Mucosa normal. No drainage or sinus tenderness. Throat: lips, mucosa, and tongue normal; teeth and gums normal Neck: no adenopathy, no carotid bruit, no JVD, supple, symmetrical, trachea midline and thyroid not enlarged, symmetric, no tenderness/mass/nodules Back: symmetric, no curvature. ROM normal. No CVA tenderness. Lungs: clear to auscultation bilaterally Breasts: normal appearance, no masses or tenderness Heart: S1, S2 normal Abdomen: soft, non-tender; bowel sounds normal; no masses,  no organomegaly Pelvic: deferred Extremities: extremities normal, atraumatic, no cyanosis or edema Pulses: 2+ and symmetric Skin: Skin color, texture, turgor normal. No rashes or lesions Lymph nodes: Cervical, supraclavicular, and axillary nodes normal. Neurologic: Alert and oriented X 3, normal strength and tone. Normal symmetric reflexes.  Normal coordination and gait    Assessment:    Healthy female exam.     Plan:  Check labs   ghm utd See After Visit Summary for Counseling Recommendations     1. Preventative health care See above  2. B12 deficiency   - cyanocobalamin ((VITAMIN B-12)) injection 1,000 mcg; Inject 1 mL (1,000 mcg total) into the muscle once. - Vitamin B12  3. Type II diabetes mellitus with peripheral circulatory disorder (HCC) hgba1c to be done, minimize simple carbs. Increase exercise as tolerated. Continue current meds  - Hemoglobin A1c - Comprehensive metabolic panel - POCT Glucose (CBG)  4. Hyperlipidemia LDL goal <70 Tolerating statin, encouraged heart healthy diet, avoid trans fats, minimize simple  carbs and saturated fats. Increase exercise as tolerated - Comprehensive metabolic panel - CBC with Differential/Platelet - Lipid panel  5. Essential hypertension Well controlled, no changes to meds. Encouraged heart healthy diet such as the DASH diet and exercise as tolerated.  - Comprehensive metabolic panel - POCT Urinalysis Dipstick (Automated)  .

## 2016-11-23 NOTE — Patient Instructions (Signed)
Preventive Care 67 Years and Older, Female Preventive care refers to lifestyle choices and visits with your health care provider that can promote health and wellness. What does preventive care include?  A yearly physical exam. This is also called an annual well check.  Dental exams once or twice a year.  Routine eye exams. Ask your health care provider how often you should have your eyes checked.  Personal lifestyle choices, including: ? Daily care of your teeth and gums. ? Regular physical activity. ? Eating a healthy diet. ? Avoiding tobacco and drug use. ? Limiting alcohol use. ? Practicing safe sex. ? Taking low-dose aspirin every day. ? Taking vitamin and mineral supplements as recommended by your health care provider. What happens during an annual well check? The services and screenings done by your health care provider during your annual well check will depend on your age, overall health, lifestyle risk factors, and family history of disease. Counseling Your health care provider may ask you questions about your:  Alcohol use.  Tobacco use.  Drug use.  Emotional well-being.  Home and relationship well-being.  Sexual activity.  Eating habits.  History of falls.  Memory and ability to understand (cognition).  Work and work environment.  Reproductive health.  Screening You may have the following tests or measurements:  Height, weight, and BMI.  Blood pressure.  Lipid and cholesterol levels. These may be checked every 5 years, or more frequently if you are over 50 years old.  Skin check.  Lung cancer screening. You may have this screening every year starting at age 55 if you have a 30-pack-year history of smoking and currently smoke or have quit within the past 15 years.  Fecal occult blood test (FOBT) of the stool. You may have this test every year starting at age 50.  Flexible sigmoidoscopy or colonoscopy. You may have a sigmoidoscopy every 5 years or  a colonoscopy every 10 years starting at age 50.  Hepatitis C blood test.  Hepatitis B blood test.  Sexually transmitted disease (STD) testing.  Diabetes screening. This is done by checking your blood sugar (glucose) after you have not eaten for a while (fasting). You may have this done every 1-3 years.  Bone density scan. This is done to screen for osteoporosis. You may have this done starting at age 67.  Mammogram. This may be done every 1-2 years. Talk to your health care provider about how often you should have regular mammograms.  Talk with your health care provider about your test results, treatment options, and if necessary, the need for more tests. Vaccines Your health care provider may recommend certain vaccines, such as:  Influenza vaccine. This is recommended every year.  Tetanus, diphtheria, and acellular pertussis (Tdap, Td) vaccine. You may need a Td booster every 10 years.  Varicella vaccine. You may need this if you have not been vaccinated.  Zoster vaccine. You may need this after age 60.  Measles, mumps, and rubella (MMR) vaccine. You may need at least one dose of MMR if you were born in 1957 or later. You may also need a second dose.  Pneumococcal 13-valent conjugate (PCV13) vaccine. One dose is recommended after age 67.  Pneumococcal polysaccharide (PPSV23) vaccine. One dose is recommended after age 67.  Meningococcal vaccine. You may need this if you have certain conditions.  Hepatitis A vaccine. You may need this if you have certain conditions or if you travel or work in places where you may be exposed to hepatitis   A.  Hepatitis B vaccine. You may need this if you have certain conditions or if you travel or work in places where you may be exposed to hepatitis B.  Haemophilus influenzae type b (Hib) vaccine. You may need this if you have certain conditions.  Talk to your health care provider about which screenings and vaccines you need and how often you  need them. This information is not intended to replace advice given to you by your health care provider. Make sure you discuss any questions you have with your health care provider. Document Released: 07/09/2015 Document Revised: 03/01/2016 Document Reviewed: 04/13/2015 Elsevier Interactive Patient Education  2017 [employer].

## 2016-11-24 ENCOUNTER — Ambulatory Visit: Payer: Medicare Other

## 2016-11-30 ENCOUNTER — Other Ambulatory Visit: Payer: Self-pay | Admitting: Family Medicine

## 2016-11-30 ENCOUNTER — Telehealth: Payer: Self-pay | Admitting: Family Medicine

## 2016-11-30 DIAGNOSIS — D649 Anemia, unspecified: Secondary | ICD-10-CM

## 2016-11-30 DIAGNOSIS — K625 Hemorrhage of anus and rectum: Secondary | ICD-10-CM

## 2016-11-30 NOTE — Telephone Encounter (Signed)
Caller name: Zoelle Ozer Relationship to patient: self Can be reached: [phone]  Reason for call: pt talked to insurance about formulary med and thinks she would prefer referral to Endo.

## 2016-12-01 ENCOUNTER — Other Ambulatory Visit: Payer: Self-pay | Admitting: Family Medicine

## 2016-12-01 DIAGNOSIS — E119 Type 2 diabetes mellitus without complications: Secondary | ICD-10-CM

## 2016-12-01 NOTE — Telephone Encounter (Signed)
Endo referral done

## 2016-12-01 NOTE — Telephone Encounter (Signed)
Ok to refer.

## 2016-12-11 ENCOUNTER — Encounter: Payer: Self-pay | Admitting: Endocrinology

## 2016-12-12 NOTE — Progress Notes (Signed)
Subjective:   Alyssa Ford is a 66 y.o. female who presents for Medicare Annual (Subsequent) preventive examination.  Review of Systems:  No ROS.  Medicare Wellness Visit. Additional risk factors are reflected in the social history.  Cardiac Risk Factors include: advanced age (>35men, >56 women);diabetes mellitus;dyslipidemia;hypertension;sedentary lifestyle Sleep patterns: Takes melatonin to sleep. Wakes once to urinate.Sleeps 6-8hrs. Feels rested. Home Safety/Smoke Alarms: Feels safe in home. Smoke alarms in place.  Living environment; residence and Firearm Safety: Lives with husband. 2 story home. Master on 1st. Guns safely stored.  Seat Belt Safety/Bike Helmet: Wears seat belt.   Counseling:   Eye Exam- wears glasses. Dr.[name] every 3 months for glaucoma and DM checks.  Dental- DrMarland KitchenHowell yearly.  Female:   Pap- Last 10/31/13: normal. No longer doing routine screening due to age.   Mammo- Last 10/12/15: BI-RADS CATEGORY  1: Negative.   ORDERED TODAY. Dexa scan-  Last 05/09/13:  Normal. ORDERED TODAY CCS- Last 09/05/16: one polyp removed. Recall 1 yr due to sub optimal bowel prep.    Objective:     Vitals: BP 140/64 (BP Location: Right Arm, Patient Position: Sitting, Cuff Size: Large)   Pulse 78   Ht 5\' 5"  (1.651 m)   Wt 164 lb 6.4 oz (74.6 kg)   SpO2 98%   BMI 27.36 kg/m   Body mass index is 27.36 kg/m.   Tobacco History  Smoking Status  . Former Smoker  . Packs/day: 1.40  . Years: 49.00  . Types: Cigarettes  . Quit date: 07/16/2015  Smokeless Tobacco  . Never Used    Comment: e cig     Counseling given: No   Past Medical History:  Diagnosis Date  . Anemia   . Arthritis   . Cataract   . Chronic headaches   . Diabetes mellitus   . Diverticulosis of colon (without mention of hemorrhage) 2003, 2008   Colonoscopy  . GERD (gastroesophageal reflux disease)   . Glaucoma   . Hiatal hernia 2003   EGD   . Hyperlipidemia   . Hypertension   . Thyroid nodule     Past Surgical History:  Procedure Laterality Date  . CARPAL TUNNEL RELEASE  2006  . CARPAL TUNNEL RELEASE  12/24/2007  . CERVICAL FUSION  3/08  . CHOLECYSTECTOMY     Family History  Problem Relation Age of Onset  . Dementia Mother   . Breast cancer Mother 70  . Kidney failure Mother 47       died from kidney failure from abx given to her for pneumonia  . Hyperlipidemia Mother   . Hypertension Mother   . Diabetes Mother   . Cancer Mother 18       breast  . Parkinsonism Father   . Diabetes Father 73  . Hypertension Sister   . Diabetes Brother   . Hypertension Brother   . Heart disease Cousin 37       mi  . Colon cancer Neg Hx    History  Sexual Activity  . Sexual activity: Yes  . Partners: Male    Outpatient Encounter Prescriptions as of 12/14/2016  Medication Sig  . amitriptyline (ELAVIL) 25 MG tablet TAKE TWO TABLETS BY MOUTH ONCE DAILY  . atorvastatin (LIPITOR) 80 MG tablet Take 1 tablet (80 mg total) by mouth daily.  Marland Kitchen BIOTIN PO Take 1 tablet by mouth daily.   Marland Kitchen buPROPion (WELLBUTRIN XL) 150 MG 24 hr tablet TAKE TWO TABLETS BY MOUTH ONCE DAILY IN THE  MORNING  . diazepam (VALIUM) 5 MG tablet TAKE ONE TABLET BY MOUTH AT BEDTIME AS NEEDED FOR ANXIETY  . Ferrous Sulfate (IRON SUPPLEMENT PO) Take 1 tablet by mouth daily.  Marland Kitchen glimepiride (AMARYL) 4 MG tablet Take 1 tablet (4 mg total) by mouth 2 (two) times daily.  . hydrochlorothiazide (HYDRODIURIL) 25 MG tablet TAKE ONE TABLET BY MOUTH ONCE DAILY  . latanoprost (XALATAN) 0.005 % ophthalmic solution Place 1 drop into both eyes at bedtime.   . metFORMIN (GLUCOPHAGE) 850 MG tablet 1 po qam and 2 po qpm  . Naproxen Sodium (ALEVE PO) Take 2 tablets by mouth daily.  Marland Kitchen omeprazole (PRILOSEC) 40 MG capsule Take 1 capsule (40 mg total) by mouth daily.   Facility-Administered Encounter Medications as of 12/14/2016  Medication  . 0.9 %  sodium chloride infusion    Activities of Daily Living In your present state of health, do  you have any difficulty performing the following activities: 12/14/2016 10/27/2016  Hearing? N N  Vision? N Y  Difficulty concentrating or making decisions? N N  Walking or climbing stairs? N N  Dressing or bathing? N N  Doing errands, shopping? N N  Preparing Food and eating ? N -  Using the Toilet? N -  In the past six months, have you accidently leaked urine? N -  Do you have problems with loss of bowel control? N -  Managing your Medications? N -  Managing your Finances? N -  Housekeeping or managing your Housekeeping? N -  Some recent data might be hidden    Patient Care Team: Alyssa Ford, Alferd Apa, DO as PCP - General    Assessment:    Physical assessment deferred to PCP.  Exercise Activities and Dietary recommendations Current Exercise Habits: The patient does not participate in regular exercise at present, Exercise limited by: None identified   Diet (meal preparation, eat out, water intake, caffeinated beverages, dairy products, fruits and vegetables): in general, a "healthy" diet  , well balanced      Goals    . Begin walking at least 3x/week      Fall Risk Fall Risk  12/14/2016 10/27/2016 09/13/2015 09/13/2015  Falls in the past year? Yes No No No  Number falls in past yr: 1 - - -  Injury with Fall? No - - -  Follow up Education provided;Falls prevention discussed - - -   Depression Screen PHQ 2/9 Scores 12/14/2016 10/27/2016 04/28/2016 09/13/2015  PHQ - 2 Score 0 0 0 0     Cognitive Function MMSE - Mini Mental State Exam 12/14/2016  Orientation to time 5  Orientation to Place 5  Registration 3  Attention/ Calculation 5  Recall 3  Language- name 2 objects 2  Language- repeat 1  Language- follow 3 step command 3  Language- read & follow direction 1  Write a sentence 1  Copy design 1  Total score 30        Immunization History  Administered Date(s) Administered  . Influenza Split 04/13/2011  . Influenza Whole 04/17/2006  . Influenza, High Dose Seasonal PF  04/12/2016  . Influenza, Seasonal, Injecte, Preservative Fre 05/22/2012  . Influenza,inj,Quad PF,36+ Mos 03/03/2014, 03/18/2015  . Pneumococcal Conjugate-13 09/13/2015  . Pneumococcal Polysaccharide-23 03/31/2003, 11/03/2008  . Td 10/24/1999, 02/23/2010  . Zoster 12/25/2011   Screening Tests Health Maintenance  Topic Date Due  . FOOT EXAM  09/12/2016  . PNA vac Low Risk Adult (2 of 2 - PPSV23) 01/27/2017 (Originally 09/12/2016)  .  OPHTHALMOLOGY EXAM  01/04/2017  . INFLUENZA VACCINE  01/24/2017  . URINE MICROALBUMIN  04/28/2017  . HEMOGLOBIN A1C  05/25/2017  . COLONOSCOPY  09/11/2017  . MAMMOGRAM  10/11/2017  . TETANUS/TDAP  02/24/2020  . DEXA SCAN  Completed  . Hepatitis C Screening  Completed      Plan:    Follow up with Dr.Lowne and Endocrinology as scheduled.  Continue to eat heart healthy diet (full of fruits, vegetables, whole grains, lean protein, water--limit salt, fat, and sugar intake) and increase physical activity as tolerated.  Continue doing brain stimulating activities (puzzles, reading, adult coloring books, staying active) to keep memory sharp.   Schedule mammogram and bone density scan as ordered.  Consider getting carbon monoxide detector for home safety.  I have personally reviewed and noted the following in the patient's chart:   . Medical and social history . Use of alcohol, tobacco or illicit drugs  . Current medications and supplements . Functional ability and status . Nutritional status . Physical activity . Advanced directives . List of other physicians . Hospitalizations, surgeries, and ER visits in previous 12 months . Vitals . Screenings to include cognitive, depression, and falls . Referrals and appointments  In addition, I have reviewed and discussed with patient certain preventive protocols, quality metrics, and best practice recommendations. A written personalized care plan for preventive services as well as general preventive health  recommendations were provided to patient.     Naaman Plummer [place], [place]  12/14/2016

## 2016-12-14 ENCOUNTER — Encounter: Payer: Self-pay | Admitting: *Deleted

## 2016-12-14 ENCOUNTER — Ambulatory Visit (INDEPENDENT_AMBULATORY_CARE_PROVIDER_SITE_OTHER): Payer: Medicare Other | Admitting: *Deleted

## 2016-12-14 VITALS — BP 140/64 | HR 78 | Ht 65.0 in | Wt 164.4 lb

## 2016-12-14 DIAGNOSIS — Z0000 Encounter for general adult medical examination without abnormal findings: Secondary | ICD-10-CM | POA: Diagnosis not present

## 2016-12-14 DIAGNOSIS — Z1239 Encounter for other screening for malignant neoplasm of breast: Secondary | ICD-10-CM

## 2016-12-14 DIAGNOSIS — Z780 Asymptomatic menopausal state: Secondary | ICD-10-CM

## 2016-12-14 NOTE — Patient Instructions (Signed)
Alyssa Ford , Thank you for taking time to come for your Medicare Wellness Visit. I appreciate your ongoing commitment to your health goals. Please review the following plan we discussed and let me know if I can assist you in the future.   These are the goals we discussed: Goals    . Begin walking at least 3x/week       This is a list of the screening recommended for you and due dates:  Health Maintenance  Topic Date Due  . Complete foot exam   09/12/2016  . Pneumonia vaccines (2 of 2 - PPSV23) 01/27/2017*  . Eye exam for diabetics  01/04/2017  . Flu Shot  01/24/2017  . Urine Protein Check  04/28/2017  . Hemoglobin A1C  05/25/2017  . Colon Cancer Screening  09/11/2017  . Mammogram  10/11/2017  . Tetanus Vaccine  02/24/2020  . DEXA scan (bone density measurement)  Completed  .  Hepatitis C: One time screening is recommended by Center for Disease Control  (CDC) for  adults born from [birth year] through 1966.   Completed  *Topic was postponed. The date shown is not the original due date.     Follow up with Dr.Lowne and Endocrinology as scheduled.  Continue to eat heart healthy diet (full of fruits, vegetables, whole grains, lean protein, water--limit salt, fat, and sugar intake) and increase physical activity as tolerated.  Continue doing brain stimulating activities (puzzles, reading, adult coloring books, staying active) to keep memory sharp.   Schedule mammogram and bone density scan as ordered.  Consider getting carbon monoxide detector for home safety.   Health Maintenance for Postmenopausal Women Menopause is a normal process in which your reproductive ability comes to an end. This process happens gradually over a span of months to years, usually between the ages of 36 and 25. Menopause is complete when you have missed 12 consecutive menstrual periods. It is important to talk with your health care provider about some of the most common conditions that affect postmenopausal women,  such as heart disease, cancer, and bone loss (osteoporosis). Adopting a healthy lifestyle and getting preventive care can help to promote your health and wellness. Those actions can also lower your chances of developing some of these common conditions. What should I know about menopause? During menopause, you may experience a number of symptoms, such as:  Moderate-to-severe hot flashes.  Night sweats.  Decrease in sex drive.  Mood swings.  Headaches.  Tiredness.  Irritability.  Memory problems.  Insomnia.  Choosing to treat or not to treat menopausal changes is an individual decision that you make with your health care provider. What should I know about hormone replacement therapy and supplements? Hormone therapy products are effective for treating symptoms that are associated with menopause, such as hot flashes and night sweats. Hormone replacement carries certain risks, especially as you become older. If you are thinking about using estrogen or estrogen with progestin treatments, discuss the benefits and risks with your health care provider. What should I know about heart disease and stroke? Heart disease, heart attack, and stroke become more likely as you age. This may be due, in part, to the hormonal changes that your body experiences during menopause. These can affect how your body processes dietary fats, triglycerides, and cholesterol. Heart attack and stroke are both medical emergencies. There are many things that you can do to help prevent heart disease and stroke:  Have your blood pressure checked at least every 1-2 years. High blood pressure  causes heart disease and increases the risk of stroke.  If you are 30-35 years old, ask your health care provider if you should take aspirin to prevent a heart attack or a stroke.  Do not use any tobacco products, including cigarettes, chewing tobacco, or electronic cigarettes. If you need help quitting, ask your health care  provider.  It is important to eat a healthy diet and maintain a healthy weight. ? Be sure to include plenty of vegetables, fruits, low-fat dairy products, and lean protein. ? Avoid eating foods that are high in solid fats, added sugars, or salt (sodium).  Get regular exercise. This is one of the most important things that you can do for your health. ? Try to exercise for at least 150 minutes each week. The type of exercise that you do should increase your heart rate and make you sweat. This is known as moderate-intensity exercise. ? Try to do strengthening exercises at least twice each week. Do these in addition to the moderate-intensity exercise.  Know your numbers.Ask your health care provider to check your cholesterol and your blood glucose. Continue to have your blood tested as directed by your health care provider.  What should I know about cancer screening? There are several types of cancer. Take the following steps to reduce your risk and to catch any cancer development as early as possible. Breast Cancer  Practice breast self-awareness. ? This means understanding how your breasts normally appear and feel. ? It also means doing regular breast self-exams. Let your health care provider know about any changes, no matter how small.  If you are 54 or older, have a clinician do a breast exam (clinical breast exam or CBE) every year. Depending on your age, family history, and medical history, it may be recommended that you also have a yearly breast X-ray (mammogram).  If you have a family history of breast cancer, talk with your health care provider about genetic screening.  If you are at high risk for breast cancer, talk with your health care provider about having an MRI and a mammogram every year.  Breast cancer (BRCA) gene test is recommended for women who have family members with BRCA-related cancers. Results of the assessment will determine the need for genetic counseling and BRCA1  and for BRCA2 testing. BRCA-related cancers include these types: ? Breast. This occurs in males or females. ? Ovarian. ? Tubal. This may also be called fallopian tube cancer. ? Cancer of the abdominal or pelvic lining (peritoneal cancer). ? Prostate. ? Pancreatic.  Cervical, Uterine, and Ovarian Cancer Your health care provider may recommend that you be screened regularly for cancer of the pelvic organs. These include your ovaries, uterus, and vagina. This screening involves a pelvic exam, which includes checking for microscopic changes to the surface of your cervix (Pap test).  For women ages 21-65, health care providers may recommend a pelvic exam and a Pap test every three years. For women ages 71-65, they may recommend the Pap test and pelvic exam, combined with testing for human papilloma virus (HPV), every five years. Some types of HPV increase your risk of cervical cancer. Testing for HPV may also be done on women of any age who have unclear Pap test results.  Other health care providers may not recommend any screening for nonpregnant women who are considered low risk for pelvic cancer and have no symptoms. Ask your health care provider if a screening pelvic exam is right for you.  If you have had  past treatment for cervical cancer or a condition that could lead to cancer, you need Pap tests and screening for cancer for at least 20 years after your treatment. If Pap tests have been discontinued for you, your risk factors (such as having a new sexual partner) need to be reassessed to determine if you should start having screenings again. Some women have medical problems that increase the chance of getting cervical cancer. In these cases, your health care provider may recommend that you have screening and Pap tests more often.  If you have a family history of uterine cancer or ovarian cancer, talk with your health care provider about genetic screening.  If you have vaginal bleeding after  reaching menopause, tell your health care provider.  There are currently no reliable tests available to screen for ovarian cancer.  Lung Cancer Lung cancer screening is recommended for adults 93-58 years old who are at high risk for lung cancer because of a history of smoking. A yearly low-dose CT scan of the lungs is recommended if you:  Currently smoke.  Have a history of at least 30 pack-years of smoking and you currently smoke or have quit within the past 15 years. A pack-year is smoking an average of one pack of cigarettes per day for one year.  Yearly screening should:  Continue until it has been 15 years since you quit.  Stop if you develop a health problem that would prevent you from having lung cancer treatment.  Colorectal Cancer  This type of cancer can be detected and can often be prevented.  Routine colorectal cancer screening usually begins at age 51 and continues through age 67.  If you have risk factors for colon cancer, your health care provider may recommend that you be screened at an earlier age.  If you have a family history of colorectal cancer, talk with your health care provider about genetic screening.  Your health care provider may also recommend using home test kits to check for hidden blood in your stool.  A small camera at the end of a tube can be used to examine your colon directly (sigmoidoscopy or colonoscopy). This is done to check for the earliest forms of colorectal cancer.  Direct examination of the colon should be repeated every 5-10 years until age 56. However, if early forms of precancerous polyps or small growths are found or if you have a family history or genetic risk for colorectal cancer, you may need to be screened more often.  Skin Cancer  Check your skin from head to toe regularly.  Monitor any moles. Be sure to tell your health care provider: ? About any new moles or changes in moles, especially if there is a change in a mole's  shape or color. ? If you have a mole that is larger than the size of a pencil eraser.  If any of your family members has a history of skin cancer, especially at a young age, talk with your health care provider about genetic screening.  Always use sunscreen. Apply sunscreen liberally and repeatedly throughout the day.  Whenever you are outside, protect yourself by wearing long sleeves, pants, a wide-brimmed hat, and sunglasses.  What should I know about osteoporosis? Osteoporosis is a condition in which bone destruction happens more quickly than new bone creation. After menopause, you may be at an increased risk for osteoporosis. To help prevent osteoporosis or the bone fractures that can happen because of osteoporosis, the following is recommended:  If you  are 18-57 years old, get at least 1,000 mg of calcium and at least 600 mg of vitamin D per day.  If you are older than age 52 but younger than age 66, get at least 1,200 mg of calcium and at least 600 mg of vitamin D per day.  If you are older than age 89, get at least 1,200 mg of calcium and at least 800 mg of vitamin D per day.  Smoking and excessive alcohol intake increase the risk of osteoporosis. Eat foods that are rich in calcium and vitamin D, and do weight-bearing exercises several times each week as directed by your health care provider. What should I know about how menopause affects my mental health? Depression may occur at any age, but it is more common as you become older. Common symptoms of depression include:  Low or sad mood.  Changes in sleep patterns.  Changes in appetite or eating patterns.  Feeling an overall lack of motivation or enjoyment of activities that you previously enjoyed.  Frequent crying spells.  Talk with your health care provider if you think that you are experiencing depression. What should I know about immunizations? It is important that you get and maintain your immunizations. These  include:  Tetanus, diphtheria, and pertussis (Tdap) booster vaccine.  Influenza every year before the flu season begins.  Pneumonia vaccine.  Shingles vaccine.  Your health care provider may also recommend other immunizations. This information is not intended to replace advice given to you by your health care provider. Make sure you discuss any questions you have with your health care provider. Document Released: 08/04/2005 Document Revised: 12/31/2015 Document Reviewed: 03/16/2015 Elsevier Interactive Patient Education  2018 [employer].

## 2016-12-22 ENCOUNTER — Encounter: Payer: Self-pay | Admitting: Family Medicine

## 2016-12-26 ENCOUNTER — Other Ambulatory Visit: Payer: Self-pay | Admitting: Family Medicine

## 2016-12-26 DIAGNOSIS — K219 Gastro-esophageal reflux disease without esophagitis: Secondary | ICD-10-CM

## 2016-12-28 ENCOUNTER — Ambulatory Visit (INDEPENDENT_AMBULATORY_CARE_PROVIDER_SITE_OTHER): Payer: Medicare Other | Admitting: Endocrinology

## 2016-12-28 ENCOUNTER — Encounter: Payer: Self-pay | Admitting: Endocrinology

## 2016-12-28 VITALS — BP 138/72 | HR 96 | Ht 65.0 in | Wt 161.0 lb

## 2016-12-28 DIAGNOSIS — E1142 Type 2 diabetes mellitus with diabetic polyneuropathy: Secondary | ICD-10-CM | POA: Diagnosis not present

## 2016-12-28 MED ORDER — REPAGLINIDE 2 MG PO TABS: 2.0000 mg | ORAL_TABLET | Freq: Three times a day (TID) | ORAL | 11 refills | Status: DC

## 2016-12-28 MED ORDER — METFORMIN HCL ER 500 MG PO TB24: 2000.0000 mg | ORAL_TABLET | Freq: Every day | ORAL | 11 refills | Status: DC

## 2016-12-28 NOTE — Progress Notes (Signed)
Subjective:    Patient ID: Alyssa Ford, female    DOB: 03/10/1951, 66 y.o.   MRN: [id number]  HPI pt is referred by Dr Mikle Bosworth, for diabetes.  Pt states DM was dx'ed in 2000; Alyssa Ford has mild neuropathy of the lower extremities; Alyssa Ford is unaware of any associated chronic complications; Alyssa Ford has never been on insulin; pt says her diet and exercise are fair; Alyssa Ford has never had GDM, pancreatitis, pancreatic surgery, severe hypoglycemia or DKA.  Alyssa Ford seldom has hypoglycemia, and these episodes are mild. Alyssa Ford requests generic meds only.   Past Medical History:  Diagnosis Date  . Anemia   . Arthritis   . Cataract   . Chronic headaches   . Diabetes mellitus   . Diverticulosis of colon (without mention of hemorrhage) 2003, 2008   Colonoscopy  . GERD (gastroesophageal reflux disease)   . Glaucoma   . Hiatal hernia 2003   EGD   . Hyperlipidemia   . Hypertension   . Thyroid nodule     Past Surgical History:  Procedure Laterality Date  . CARPAL TUNNEL RELEASE  2006  . CARPAL TUNNEL RELEASE  12/24/2007  . CERVICAL FUSION  3/08  . CHOLECYSTECTOMY      Social History   Social History  . Marital status: Married    Spouse name: N/A  . Number of children: N/A  . Years of education: N/A   Occupational History  . retired    Social History Main Topics  . Smoking status: Former Smoker    Packs/day: 1.40    Years: 49.00    Types: Cigarettes    Quit date: 07/16/2015  . Smokeless tobacco: Never Used     Comment: e cig  . Alcohol use 0.0 oz/week     Comment: once weekly; wine and beer  . Drug use: No  . Sexual activity: Yes    Partners: Male   Other Topics Concern  . Not on file   Social History Narrative   Exercise--  walk    Current Outpatient Prescriptions on File Prior to Visit  Medication Sig Dispense Refill  . amitriptyline (ELAVIL) 25 MG tablet TAKE TWO TABLETS BY MOUTH ONCE DAILY 60 tablet 5  . atorvastatin (LIPITOR) 80 MG tablet Take 1 tablet (80 mg total) by mouth daily. 90  tablet 0  . BIOTIN PO Take 1 tablet by mouth daily.     Marland Kitchen buPROPion (WELLBUTRIN XL) 150 MG 24 hr tablet TAKE TWO TABLETS BY MOUTH ONCE DAILY IN THE MORNING 180 tablet 3  . diazepam (VALIUM) 5 MG tablet TAKE ONE TABLET BY MOUTH AT BEDTIME AS NEEDED FOR ANXIETY 30 tablet 2  . Ferrous Sulfate (IRON SUPPLEMENT PO) Take 1 tablet by mouth daily.    . hydrochlorothiazide (HYDRODIURIL) 25 MG tablet TAKE ONE TABLET BY MOUTH ONCE DAILY 30 tablet 8  . latanoprost (XALATAN) 0.005 % ophthalmic solution Place 1 drop into both eyes at bedtime.     . Naproxen Sodium (ALEVE PO) Take 2 tablets by mouth daily.    Marland Kitchen omeprazole (PRILOSEC) 40 MG capsule TAKE ONE CAPSULE BY MOUTH ONCE DAILY 90 capsule 3   Current Facility-Administered Medications on File Prior to Visit  Medication Dose Route Frequency Provider Last Rate Last Dose  . 0.9 %  sodium chloride infusion  500 mL Intravenous Continuous Nandigam, Venia Minks, MD        Allergies  Allergen Reactions  . Codeine Nausea Only    Severe nausea    Family  History  Problem Relation Age of Onset  . Dementia Mother   . Breast cancer Mother 98  . Kidney failure Mother 6       died from kidney failure from abx given to her for pneumonia  . Hyperlipidemia Mother   . Hypertension Mother   . Diabetes Mother   . Cancer Mother 58       breast  . Parkinsonism Father   . Diabetes Father 46  . Hypertension Sister   . Diabetes Brother   . Hypertension Brother   . Heart disease Cousin 50       mi  . Colon cancer Neg Hx     BP 138/72   Pulse 96   Ht 5\' 5"  (1.651 m)   Wt 161 lb (73 kg)   SpO2 95%   BMI 26.79 kg/m    Review of Systems denies weight loss, blurry vision, headache, chest pain, sob, vomiting, urinary frequency, muscle cramps, depression, cold intolerance, rhinorrhea, and easy bruising.  Alyssa Ford has intermitt nausea, and dry skin.      Objective:   Physical Exam VS: see vs page GEN: no distress HEAD: head: no deformity eyes: no periorbital  swelling, no proptosis.   external nose and ears are normal mouth: no lesion seen NECK: I think I can feel the top of a goiter.   CHEST WALL: no deformity LUNGS: clear to auscultation CV: reg rate and rhythm, no murmur ABD: abdomen is soft, nontender.  no hepatosplenomegaly.  not distended.  no hernia MUSCULOSKELETAL: muscle bulk and strength are grossly normal.  no obvious joint swelling.  gait is normal and steady EXTEMITIES: no deformity.  no ulcer on the feet.  feet are of normal color and temp.  no edema PULSES: dorsalis pedis intact bilat.  no carotid bruit NEURO:  cn 2-12 grossly intact.   readily moves all 4's.  sensation is intact to touch on the feet SKIN:  Normal texture and temperature.  No rash or suspicious lesion is visible.   NODES:  None palpable at the neck.  PSYCH: alert, well-oriented.  Does not appear anxious nor depressed.     Lab Results  Component Value Date   HGBA1C 7.4 (H) 11/23/2016   I personally reviewed electrocardiogram tracing (09/13/15): Indication: wellness Impression: NSR.  No MI.  No hypertrophy. Compared to 2011: borderline ST changes are resolved     Assessment & Plan:  Type 2 DM: he needs increased rx, if it can be done with a regimen that avoids or minimizes hypoglycemia.  Patient Instructions  good diet and exercise significantly improve the control of your diabetes.  please let me know if you wish to be referred to a dietician.  high blood sugar is very risky to your health.  you should see an eye doctor and dentist every year.  It is very important to get all recommended vaccinations.  Controlling your blood pressure and cholesterol drastically reduces the damage diabetes does to your body.  Those who smoke should quit.  Please discuss these with your doctor.  check your blood sugar once a day.  vary the time of day when you check, between before the 3 meals, and at bedtime.  also check if you have symptoms of your blood sugar being too high  or too low.  please keep a record of the readings and bring it to your next appointment here (or you can bring the meter itself).  You can write it on any piece of paper.  please call us sooner if your blood sugar goes below 70, or if you have a lot of readings over 200.   I have sent 2 prescriptions to your pharmacy: to change glimepiride to "repaglinide," and to change metformin to extended-release. Please come back for a follow-up appointment in 2-3 months.

## 2016-12-28 NOTE — Patient Instructions (Addendum)
good diet and exercise significantly improve the control of your diabetes.  please let me know if you wish to be referred to a dietician.  high blood sugar is very risky to your health.  you should see an eye doctor and dentist every year.  It is very important to get all recommended vaccinations.  Controlling your blood pressure and cholesterol drastically reduces the damage diabetes does to your body.  Those who smoke should quit.  Please discuss these with your doctor.  check your blood sugar once a day.  vary the time of day when you check, between before the 3 meals, and at bedtime.  also check if you have symptoms of your blood sugar being too high or too low.  please keep a record of the readings and bring it to your next appointment here (or you can bring the meter itself).  You can write it on any piece of paper.  please call us sooner if your blood sugar goes below 70, or if you have a lot of readings over 200.   I have sent 2 prescriptions to your pharmacy: to change glimepiride to "repaglinide," and to change metformin to extended-release. Please come back for a follow-up appointment in 2-3 months.

## 2016-12-29 ENCOUNTER — Telehealth: Payer: Self-pay | Admitting: Family Medicine

## 2016-12-29 NOTE — Telephone Encounter (Signed)
°  Relation to YY:TKPT Call back [phone]   Reason for call:  Patient has a scheduled lab appointment for Tuesday 01/02/17 at 8:30am and would like her B12 conducted, please advise id orders are needed.

## 2016-12-29 NOTE — Telephone Encounter (Signed)
Let me know if ok to add and will do Last b12 was checked in 10/2016

## 2016-12-31 NOTE — Telephone Encounter (Signed)
Ok to add with dx b12 def

## 2017-01-01 ENCOUNTER — Other Ambulatory Visit: Payer: Self-pay | Admitting: Family Medicine

## 2017-01-01 DIAGNOSIS — E538 Deficiency of other specified B group vitamins: Secondary | ICD-10-CM

## 2017-01-01 NOTE — Telephone Encounter (Signed)
Put in lab order for vitamin B12.

## 2017-01-02 ENCOUNTER — Other Ambulatory Visit (INDEPENDENT_AMBULATORY_CARE_PROVIDER_SITE_OTHER): Payer: Medicare Other

## 2017-01-02 DIAGNOSIS — E538 Deficiency of other specified B group vitamins: Secondary | ICD-10-CM | POA: Diagnosis not present

## 2017-01-02 DIAGNOSIS — D649 Anemia, unspecified: Secondary | ICD-10-CM | POA: Diagnosis not present

## 2017-01-02 LAB — CBC WITH DIFFERENTIAL/PLATELET
Basophils Absolute: 0.1 10*3/uL (ref 0.0–0.1)
Basophils Relative: 0.8 % (ref 0.0–3.0)
Eosinophils Absolute: 0.1 10*3/uL (ref 0.0–0.7)
Eosinophils Relative: 1.6 % (ref 0.0–5.0)
HCT: 31.4 % — ABNORMAL LOW (ref 36.0–46.0)
Hemoglobin: 10.2 g/dL — ABNORMAL LOW (ref 12.0–15.0)
Lymphocytes Relative: 24.7 % (ref 12.0–46.0)
Lymphs Abs: 2 10*3/uL (ref 0.7–4.0)
MCHC: 32.3 g/dL (ref 30.0–36.0)
MCV: 74.9 fl — ABNORMAL LOW (ref 78.0–100.0)
Monocytes Absolute: 0.5 10*3/uL (ref 0.1–1.0)
Monocytes Relative: 5.7 % (ref 3.0–12.0)
Neutro Abs: 5.4 10*3/uL (ref 1.4–7.7)
Neutrophils Relative %: 67.2 % (ref 43.0–77.0)
Platelets: 373 10*3/uL (ref 150.0–400.0)
RBC: 4.19 Mil/uL (ref 3.87–5.11)
RDW: 18.5 % — ABNORMAL HIGH (ref 11.5–15.5)
WBC: 8 10*3/uL (ref 4.0–10.5)

## 2017-01-02 LAB — VITAMIN B12: Vitamin B-12: 268 pg/mL (ref 211–911)

## 2017-01-02 LAB — IBC PANEL
Iron: 32 ug/dL — ABNORMAL LOW (ref 42–145)
Saturation Ratios: 6.6 % — ABNORMAL LOW (ref 20.0–50.0)
Transferrin: 344 mg/dL (ref 212.0–360.0)

## 2017-01-02 LAB — FERRITIN: Ferritin: 5.6 ng/mL — ABNORMAL LOW (ref 10.0–291.0)

## 2017-01-03 ENCOUNTER — Telehealth: Payer: Self-pay | Admitting: Nutrition

## 2017-01-03 ENCOUNTER — Encounter: Payer: Medicare Other | Attending: Endocrinology | Admitting: Nutrition

## 2017-01-03 DIAGNOSIS — E1142 Type 2 diabetes mellitus with diabetic polyneuropathy: Secondary | ICD-10-CM | POA: Diagnosis not present

## 2017-01-03 DIAGNOSIS — E1151 Type 2 diabetes mellitus with diabetic peripheral angiopathy without gangrene: Secondary | ICD-10-CM

## 2017-01-03 DIAGNOSIS — Z713 Dietary counseling and surveillance: Secondary | ICD-10-CM | POA: Insufficient documentation

## 2017-01-03 DIAGNOSIS — E1165 Type 2 diabetes mellitus with hyperglycemia: Secondary | ICD-10-CM

## 2017-01-03 DIAGNOSIS — IMO0002 Reserved for concepts with insufficient information to code with codable children: Secondary | ICD-10-CM

## 2017-01-03 MED ORDER — BROMOCRIPTINE MESYLATE 2.5 MG PO TABS: ORAL_TABLET | ORAL | 11 refills | Status: DC

## 2017-01-03 NOTE — Telephone Encounter (Signed)
Patient notified of message and voiced understanding and agreed to the new medication.

## 2017-01-03 NOTE — Telephone Encounter (Signed)
Goals are to improve cbg's and reduce appetite.  We are limits by cost factors.  Best option is to add "bromocriptine."  For the best cost, you should tke 1/4 tab daily.  I have sent a prescription to your pharmacy

## 2017-01-03 NOTE — Progress Notes (Signed)
Patient reports that she saw a dietitian 10 years ago, and wants a refresher.   Says blood sugars have been pretty high since Dr. Loanne Drilling changed her medications last week. She wrote them on a piece of paper. FBS today was 146, but the rest are 177-200 range.  She has done 2hr. pc readings: one was 208 after breakfast, and the other was 300 after lunch of sandwich and few chips and diet drink.  Also says has been so hungry all day, since starting the new medications.  Denies low blood sugar symptoms, of weakness, sweatiness, or shaking.  Typical day: 7-7:30 up having coffee with cream 8:30-9: Bfast:  2 eggs, 1 toast with jelly (30 carbs), or cold cereal and milk and fruit 10:30 snack of nabs or fruit,  1PM: sandwich, with diet drink, or unsweet tea, once in a while a few chips.  6:30PM: 4-5 ounces protein, 30-45 grams of carb 9PM: snack of 2 scoops of icecream, or 1/2 bag of lite popcorn--approx 30-40 carbs.  Exercise: none  Suggestions: 1.  Start walking briskly for 20-30 min. 5 days/wk 2.  Limit HS snacking to no more than 30 carbs 3.   Stop the cold cereal and milk.  Gave suggestions for breakfasts with 7-14 grams of protein and 30 grams of carbs.

## 2017-01-03 NOTE — Telephone Encounter (Signed)
I saw her today, and she wanted me to let you know that she has been very hungry all day and evening, since starting the new medications.  She also said that blood sugars have been high:  FBSs: 200, 177, 140 (today), and 2hr. pc Readings: 208, 300.

## 2017-01-03 NOTE — Patient Instructions (Signed)
1.  Start walking briskly for 20-30 min. 5 days/wk 2.  Limit HS snacking to no more than 30 carbs 3.   Stop the cold cereal and milk.  Gave suggestions for breakfasts with 7-14 grams of protein and 30 grams of carbs.

## 2017-01-04 ENCOUNTER — Other Ambulatory Visit: Payer: Self-pay | Admitting: Family Medicine

## 2017-01-04 DIAGNOSIS — R5381 Other malaise: Secondary | ICD-10-CM

## 2017-01-05 ENCOUNTER — Telehealth: Payer: Self-pay | Admitting: *Deleted

## 2017-01-05 NOTE — Telephone Encounter (Signed)
Received copy of Physician Orders from [facility] for Bone Density with note to "please change the Diagnosis on order. Patient has Medicare. Medicare guidelines are at the bottom of the form for correct diagnosis that insurance will accept", forwarded to provider/SLS

## 2017-01-09 ENCOUNTER — Other Ambulatory Visit: Payer: Self-pay | Admitting: Family Medicine

## 2017-01-09 DIAGNOSIS — D509 Iron deficiency anemia, unspecified: Secondary | ICD-10-CM

## 2017-01-11 DIAGNOSIS — H401131 Primary open-angle glaucoma, bilateral, mild stage: Secondary | ICD-10-CM | POA: Diagnosis not present

## 2017-01-11 DIAGNOSIS — H524 Presbyopia: Secondary | ICD-10-CM | POA: Diagnosis not present

## 2017-01-11 DIAGNOSIS — H25813 Combined forms of age-related cataract, bilateral: Secondary | ICD-10-CM | POA: Diagnosis not present

## 2017-01-11 DIAGNOSIS — E119 Type 2 diabetes mellitus without complications: Secondary | ICD-10-CM | POA: Diagnosis not present

## 2017-01-19 ENCOUNTER — Ambulatory Visit (INDEPENDENT_AMBULATORY_CARE_PROVIDER_SITE_OTHER): Payer: Medicare Other | Admitting: Behavioral Health

## 2017-01-19 DIAGNOSIS — E538 Deficiency of other specified B group vitamins: Secondary | ICD-10-CM | POA: Diagnosis not present

## 2017-01-19 MED ORDER — CYANOCOBALAMIN 1000 MCG/ML IJ SOLN
1000.0000 ug | Freq: Once | INTRAMUSCULAR | Status: AC
  Administered 2017-01-19: 1000 ug via INTRAMUSCULAR

## 2017-01-19 NOTE — Progress Notes (Signed)
Pre visit review using our clinic review tool, if applicable. No additional management support is needed unless otherwise documented below in the visit note.  Patient came in clinic for monthly B12 injection. IM injection given in the right deltoid. Patient tolerated it well. Next appointment scheduled for 02/20/17 at 9:30 AM.

## 2017-01-29 ENCOUNTER — Other Ambulatory Visit: Payer: Self-pay | Admitting: Family Medicine

## 2017-01-29 NOTE — Telephone Encounter (Signed)
Requesting:   amitriptyline Contract   11/23/2016 UDS    Low risk on 11/31/2018 Last OV      11/23/2016 Last Refill     #60 with 5 refills on 08/07/2016  Please Advise

## 2017-02-05 DIAGNOSIS — H2512 Age-related nuclear cataract, left eye: Secondary | ICD-10-CM | POA: Diagnosis not present

## 2017-02-09 ENCOUNTER — Other Ambulatory Visit (INDEPENDENT_AMBULATORY_CARE_PROVIDER_SITE_OTHER): Payer: Medicare Other

## 2017-02-09 DIAGNOSIS — D509 Iron deficiency anemia, unspecified: Secondary | ICD-10-CM

## 2017-02-09 LAB — CBC WITH DIFFERENTIAL/PLATELET
Basophils Absolute: 0.1 10*3/uL (ref 0.0–0.1)
Basophils Relative: 0.4 % (ref 0.0–3.0)
Eosinophils Absolute: 0.1 10*3/uL (ref 0.0–0.7)
Eosinophils Relative: 1 % (ref 0.0–5.0)
HCT: 37.7 % (ref 36.0–46.0)
Hemoglobin: 12 g/dL (ref 12.0–15.0)
Lymphocytes Relative: 18.4 % (ref 12.0–46.0)
Lymphs Abs: 2.5 10*3/uL (ref 0.7–4.0)
MCHC: 31.9 g/dL (ref 30.0–36.0)
MCV: 79.6 fl (ref 78.0–100.0)
Monocytes Absolute: 0.7 10*3/uL (ref 0.1–1.0)
Monocytes Relative: 5.6 % (ref 3.0–12.0)
Neutro Abs: 10 10*3/uL — ABNORMAL HIGH (ref 1.4–7.7)
Neutrophils Relative %: 74.6 % (ref 43.0–77.0)
Platelets: 326 10*3/uL (ref 150.0–400.0)
RBC: 4.73 Mil/uL (ref 3.87–5.11)
RDW: 20.5 % — ABNORMAL HIGH (ref 11.5–15.5)
WBC: 13.3 10*3/uL — ABNORMAL HIGH (ref 4.0–10.5)

## 2017-02-09 LAB — IBC PANEL
Iron: 64 ug/dL (ref 42–145)
Saturation Ratios: 12.8 % — ABNORMAL LOW (ref 20.0–50.0)
Transferrin: 356 mg/dL (ref 212.0–360.0)

## 2017-02-09 LAB — FERRITIN: Ferritin: 7.2 ng/mL — ABNORMAL LOW (ref 10.0–291.0)

## 2017-02-13 ENCOUNTER — Telehealth: Payer: Self-pay | Admitting: *Deleted

## 2017-02-13 NOTE — Telephone Encounter (Signed)
Received Physician Orders from [facility]; forwarded to provider/SLS 08/21

## 2017-02-14 ENCOUNTER — Other Ambulatory Visit: Payer: Self-pay | Admitting: Family Medicine

## 2017-02-14 ENCOUNTER — Other Ambulatory Visit (INDEPENDENT_AMBULATORY_CARE_PROVIDER_SITE_OTHER): Payer: Medicare Other

## 2017-02-14 DIAGNOSIS — D72829 Elevated white blood cell count, unspecified: Secondary | ICD-10-CM

## 2017-02-14 LAB — CBC WITH DIFFERENTIAL/PLATELET
Basophils Absolute: 0.1 10*3/uL (ref 0.0–0.1)
Basophils Relative: 0.5 % (ref 0.0–3.0)
Eosinophils Absolute: 0.1 10*3/uL (ref 0.0–0.7)
Eosinophils Relative: 1.3 % (ref 0.0–5.0)
HCT: 37.9 % (ref 36.0–46.0)
Hemoglobin: 11.9 g/dL — ABNORMAL LOW (ref 12.0–15.0)
Lymphocytes Relative: 27.4 % (ref 12.0–46.0)
Lymphs Abs: 3 10*3/uL (ref 0.7–4.0)
MCHC: 31.3 g/dL (ref 30.0–36.0)
MCV: 80.5 fl (ref 78.0–100.0)
Monocytes Absolute: 0.7 10*3/uL (ref 0.1–1.0)
Monocytes Relative: 6.1 % (ref 3.0–12.0)
Neutro Abs: 7.2 10*3/uL (ref 1.4–7.7)
Neutrophils Relative %: 64.7 % (ref 43.0–77.0)
Platelets: 342 10*3/uL (ref 150.0–400.0)
RBC: 4.7 Mil/uL (ref 3.87–5.11)
RDW: 20.2 % — ABNORMAL HIGH (ref 11.5–15.5)
WBC: 11.1 10*3/uL — ABNORMAL HIGH (ref 4.0–10.5)

## 2017-02-19 ENCOUNTER — Other Ambulatory Visit: Payer: Self-pay | Admitting: Family Medicine

## 2017-02-19 DIAGNOSIS — D72829 Elevated white blood cell count, unspecified: Secondary | ICD-10-CM

## 2017-02-20 ENCOUNTER — Ambulatory Visit (INDEPENDENT_AMBULATORY_CARE_PROVIDER_SITE_OTHER): Payer: Medicare Other

## 2017-02-20 ENCOUNTER — Telehealth: Payer: Self-pay

## 2017-02-20 DIAGNOSIS — E538 Deficiency of other specified B group vitamins: Secondary | ICD-10-CM

## 2017-02-20 DIAGNOSIS — Z780 Asymptomatic menopausal state: Secondary | ICD-10-CM

## 2017-02-20 DIAGNOSIS — Z1231 Encounter for screening mammogram for malignant neoplasm of breast: Secondary | ICD-10-CM

## 2017-02-20 MED ORDER — CYANOCOBALAMIN 1000 MCG/ML IJ SOLN
1000.0000 ug | Freq: Once | INTRAMUSCULAR | Status: AC
  Administered 2017-02-20: 1000 ug via INTRAMUSCULAR

## 2017-02-20 NOTE — Telephone Encounter (Signed)
Patient states she would also like to come in for Pneumonia, Flu and Shingrix Immunizations. States she needs Diagnosis sent for Mammogram and Bone Density Screening that was ordered so insurance will cover per imaging.

## 2017-02-20 NOTE — Telephone Encounter (Signed)
Orders placed.

## 2017-02-20 NOTE — Progress Notes (Signed)
Pre visit review using our clinic tool,if applicable. No additional management support is needed unless otherwise documented below in the visit note.   Patient in for B12 injection per order from Dr. Carollee Herter due to patient having B12 deficiency.  Given  B12 1000 mg IM Left deltoid. Patient tolerated well. Appointment scheduled for 1 month for next B12 injection.  Patient states she would also like to come in for Pneumonia, Flu and Shingrix Immunizations. States she needs Diagnosis sent for Mammogram and Bone Density Screening that was ordered so insurance will cover per imaging.

## 2017-03-05 DIAGNOSIS — H25812 Combined forms of age-related cataract, left eye: Secondary | ICD-10-CM | POA: Diagnosis not present

## 2017-03-05 DIAGNOSIS — H2512 Age-related nuclear cataract, left eye: Secondary | ICD-10-CM | POA: Diagnosis not present

## 2017-03-08 ENCOUNTER — Other Ambulatory Visit: Payer: Self-pay | Admitting: Family Medicine

## 2017-03-08 DIAGNOSIS — E2839 Other primary ovarian failure: Secondary | ICD-10-CM

## 2017-03-13 DIAGNOSIS — H2511 Age-related nuclear cataract, right eye: Secondary | ICD-10-CM | POA: Diagnosis not present

## 2017-03-21 ENCOUNTER — Ambulatory Visit: Payer: Medicare Other

## 2017-03-22 ENCOUNTER — Ambulatory Visit (INDEPENDENT_AMBULATORY_CARE_PROVIDER_SITE_OTHER): Payer: Medicare Other | Admitting: Behavioral Health

## 2017-03-22 ENCOUNTER — Telehealth: Payer: Self-pay | Admitting: *Deleted

## 2017-03-22 DIAGNOSIS — E538 Deficiency of other specified B group vitamins: Secondary | ICD-10-CM | POA: Diagnosis not present

## 2017-03-22 MED ORDER — CYANOCOBALAMIN 1000 MCG/ML IJ SOLN
1000.0000 ug | Freq: Once | INTRAMUSCULAR | Status: AC
  Administered 2017-03-22: 1000 ug via INTRAMUSCULAR

## 2017-03-22 NOTE — Telephone Encounter (Signed)
Received Provider Query from [facility] for Medical Record Clarification on Depression for Coding purposes, OV note attached; forwarded to provider/SLS 09/27

## 2017-03-22 NOTE — Progress Notes (Signed)
Pre visit review using our clinic review tool, if applicable. No additional management support is needed unless otherwise documented below in the visit note.  Patient came in clinic for monthly B12 injection. IM injection was given in the right deltoid. Patient tolerated it well. Next appointment scheduled for 04/24/17 at 10:00 AM.

## 2017-03-26 DIAGNOSIS — H25811 Combined forms of age-related cataract, right eye: Secondary | ICD-10-CM | POA: Diagnosis not present

## 2017-03-26 DIAGNOSIS — H2511 Age-related nuclear cataract, right eye: Secondary | ICD-10-CM | POA: Diagnosis not present

## 2017-03-30 ENCOUNTER — Ambulatory Visit (INDEPENDENT_AMBULATORY_CARE_PROVIDER_SITE_OTHER): Payer: Medicare Other | Admitting: Endocrinology

## 2017-03-30 ENCOUNTER — Encounter: Payer: Self-pay | Admitting: Endocrinology

## 2017-03-30 VITALS — BP 108/72 | HR 81 | Wt 160.8 lb

## 2017-03-30 DIAGNOSIS — E1165 Type 2 diabetes mellitus with hyperglycemia: Secondary | ICD-10-CM | POA: Diagnosis not present

## 2017-03-30 DIAGNOSIS — IMO0002 Reserved for concepts with insufficient information to code with codable children: Secondary | ICD-10-CM

## 2017-03-30 DIAGNOSIS — E1142 Type 2 diabetes mellitus with diabetic polyneuropathy: Secondary | ICD-10-CM

## 2017-03-30 DIAGNOSIS — E1151 Type 2 diabetes mellitus with diabetic peripheral angiopathy without gangrene: Secondary | ICD-10-CM

## 2017-03-30 LAB — POCT GLYCOSYLATED HEMOGLOBIN (HGB A1C): Hemoglobin A1C: 7

## 2017-03-30 MED ORDER — METFORMIN HCL ER (MOD) 1000 MG PO TB24: 2000.0000 mg | ORAL_TABLET | Freq: Every day | ORAL | 3 refills | Status: DC

## 2017-03-30 NOTE — Progress Notes (Signed)
Subjective:    Patient ID: Alyssa Ford, female    DOB: 08-Oct-1950, 66 y.o.   MRN: [id number]  HPI Pt returns for f/u of diabetes mellitus: DM type: 2 Dx'ed: 8850 Complications: polyneuropathy Therapy: 3 oral meds GDM: never DKA: never Severe hypoglycemia: never Pancreatitis: never Pancreatic imaging: never Other: she has never been on insulin Interval history: Pt states cbg's vary from 64-249.  It is highest after eating.  The 64 was at HS.  pt states she feels well in general. Past Medical History:  Diagnosis Date  . Anemia   . Arthritis   . Cataract   . Chronic headaches   . Diabetes mellitus   . Diverticulosis of colon (without mention of hemorrhage) 2003, 2008   Colonoscopy  . GERD (gastroesophageal reflux disease)   . Glaucoma   . Hiatal hernia 2003   EGD   . Hyperlipidemia   . Hypertension   . Thyroid nodule     Past Surgical History:  Procedure Laterality Date  . CARPAL TUNNEL RELEASE  2006  . CARPAL TUNNEL RELEASE  12/24/2007  . CERVICAL FUSION  3/08  . CHOLECYSTECTOMY      Social History   Social History  . Marital status: Married    Spouse name: N/A  . Number of children: N/A  . Years of education: N/A   Occupational History  . retired    Social History Main Topics  . Smoking status: Former Smoker    Packs/day: 1.40    Years: 49.00    Types: Cigarettes    Quit date: 07/16/2015  . Smokeless tobacco: Never Used     Comment: e cig  . Alcohol use 0.0 oz/week     Comment: once weekly; wine and beer  . Drug use: No  . Sexual activity: Yes    Partners: Male   Other Topics Concern  . Not on file   Social History Narrative   Exercise--  walk    Current Outpatient Prescriptions on File Prior to Visit  Medication Sig Dispense Refill  . amitriptyline (ELAVIL) 25 MG tablet TAKE 2 TABLETS BY MOUTH ONCE DAILY 60 tablet 5  . atorvastatin (LIPITOR) 80 MG tablet TAKE 1 TABLET BY MOUTH ONCE DAILY 90 tablet 0  . BIOTIN PO Take 1 tablet by mouth  daily.     . bromocriptine (PARLODEL) 2.5 MG tablet 1/4 tab daily 8 tablet 11  . buPROPion (WELLBUTRIN XL) 150 MG 24 hr tablet TAKE TWO TABLETS BY MOUTH ONCE DAILY IN THE MORNING 180 tablet 3  . diazepam (VALIUM) 5 MG tablet TAKE ONE TABLET BY MOUTH AT BEDTIME AS NEEDED FOR ANXIETY 30 tablet 2  . Ferrous Sulfate (IRON SUPPLEMENT PO) Take 1 tablet by mouth daily.    . hydrochlorothiazide (HYDRODIURIL) 25 MG tablet TAKE ONE TABLET BY MOUTH ONCE DAILY 30 tablet 8  . latanoprost (XALATAN) 0.005 % ophthalmic solution Place 1 drop into both eyes at bedtime.     . Naproxen Sodium (ALEVE PO) Take 2 tablets by mouth daily.    Marland Kitchen omeprazole (PRILOSEC) 40 MG capsule TAKE ONE CAPSULE BY MOUTH ONCE DAILY 90 capsule 3  . repaglinide (PRANDIN) 2 MG tablet Take 1 tablet (2 mg total) by mouth 3 (three) times daily before meals. 90 tablet 11   Current Facility-Administered Medications on File Prior to Visit  Medication Dose Route Frequency Provider Last Rate Last Dose  . 0.9 %  sodium chloride infusion  500 mL Intravenous Continuous Nandigam, Venia Minks, MD  Allergies  Allergen Reactions  . Codeine Nausea Only    Severe nausea    Family History  Problem Relation Age of Onset  . Dementia Mother   . Breast cancer Mother 40  . Kidney failure Mother 92       died from kidney failure from abx given to her for pneumonia  . Hyperlipidemia Mother   . Hypertension Mother   . Diabetes Mother   . Cancer Mother 48       breast  . Parkinsonism Father   . Diabetes Father 60  . Hypertension Sister   . Diabetes Brother   . Hypertension Brother   . Heart disease Cousin 30       mi  . Colon cancer Neg Hx     BP 108/72   Pulse 81   Wt 160 lb 12.8 oz (72.9 kg)   SpO2 96%   BMI 26.76 kg/m   Review of Systems She denies LOC.     Objective:   Physical Exam VITAL SIGNS:  See vs page GENERAL: no distress Pulses: foot pulses are intact bilaterally.   MSK: no deformity of the feet or ankles.  CV:  no edema of the legs or ankles Skin:  no ulcer on the feet or ankles.  normal color and temp on the feet and ankles Neuro: sensation is intact to touch on the feet and ankles.     Lab Results  Component Value Date   HGBA1C 7.0 03/30/2017   CT: table mixed cystic and solid partially calcified 3.1 cm nodule in the anterior upper mediastinum, favor substernal extension of an exophytic inferior left thyroid lobe nodule.  Lab Results  Component Value Date   TSH 2.89 03/18/2015      Assessment & Plan:  Type 2 DM: well-controlled. Thyroid nodule: all she needs is a f/u US in the future   Patient Instructions  check your blood sugar once a day.  vary the time of day when you check, between before the 3 meals, and at bedtime.  also check if you have symptoms of your blood sugar being too high or too low.  please keep a record of the readings and bring it to your next appointment here (or you can bring the meter itself).  You can write it on any piece of paper.  please call us sooner if your blood sugar goes below 70, or if you have a lot of readings over 200.   Please continue the same medications.   Please come back for a follow-up appointment in 4 months.

## 2017-03-30 NOTE — Patient Instructions (Addendum)

## 2017-04-04 ENCOUNTER — Telehealth: Payer: Self-pay

## 2017-04-04 ENCOUNTER — Ambulatory Visit: Admission: RE | Admit: 2017-04-04 | Payer: Medicare Other | Source: Ambulatory Visit

## 2017-04-04 ENCOUNTER — Ambulatory Visit
Admission: RE | Admit: 2017-04-04 | Discharge: 2017-04-04 | Disposition: A | Discharge status: Home / Self Care | Payer: Medicare Other | Source: Ambulatory Visit | Attending: Family Medicine | Admitting: Family Medicine

## 2017-04-04 ENCOUNTER — Other Ambulatory Visit: Payer: Self-pay | Admitting: Family Medicine

## 2017-04-04 DIAGNOSIS — N63 Unspecified lump in unspecified breast: Secondary | ICD-10-CM

## 2017-04-04 DIAGNOSIS — Z780 Asymptomatic menopausal state: Secondary | ICD-10-CM | POA: Diagnosis not present

## 2017-04-04 DIAGNOSIS — Z13820 Encounter for screening for osteoporosis: Secondary | ICD-10-CM | POA: Diagnosis not present

## 2017-04-04 NOTE — Telephone Encounter (Signed)
Metformin modified ER 1000 mg is not covered under the patients insurance- is it ok to change the prescription to Metformin ER regular not modified? Please advise

## 2017-04-04 NOTE — Telephone Encounter (Signed)
ok 

## 2017-04-05 ENCOUNTER — Telehealth: Payer: Self-pay | Admitting: *Deleted

## 2017-04-05 MED ORDER — METFORMIN HCL ER (OSM) 1000 MG PO TB24: 2000.0000 mg | ORAL_TABLET | Freq: Every day | ORAL | 1 refills | Status: DC

## 2017-04-05 NOTE — Telephone Encounter (Signed)
Pt advised and voiced understanding.  Rx sent.  

## 2017-04-05 NOTE — Telephone Encounter (Signed)
Received Physician Orders from [facility]; forwarded to provider/SLS 10/11

## 2017-04-05 NOTE — Addendum Note (Signed)
Addended by: Onalee Hua on: 04/05/2017 11:53 AM   Modules accepted: Orders

## 2017-04-11 ENCOUNTER — Other Ambulatory Visit: Payer: Medicare Other

## 2017-04-19 ENCOUNTER — Ambulatory Visit
Admission: RE | Admit: 2017-04-19 | Discharge: 2017-04-19 | Disposition: A | Discharge status: Home / Self Care | Payer: Medicare Other | Source: Ambulatory Visit | Attending: Family Medicine | Admitting: Family Medicine

## 2017-04-19 DIAGNOSIS — N6489 Other specified disorders of breast: Secondary | ICD-10-CM | POA: Diagnosis not present

## 2017-04-19 DIAGNOSIS — N63 Unspecified lump in unspecified breast: Secondary | ICD-10-CM

## 2017-04-19 DIAGNOSIS — R928 Other abnormal and inconclusive findings on diagnostic imaging of breast: Secondary | ICD-10-CM | POA: Diagnosis not present

## 2017-04-24 ENCOUNTER — Ambulatory Visit (INDEPENDENT_AMBULATORY_CARE_PROVIDER_SITE_OTHER)
Admission: RE | Admit: 2017-04-24 | Discharge: 2017-04-24 | Disposition: A | Discharge status: Home / Self Care | Payer: Medicare Other | Source: Ambulatory Visit | Attending: Acute Care | Admitting: Acute Care

## 2017-04-24 ENCOUNTER — Ambulatory Visit (INDEPENDENT_AMBULATORY_CARE_PROVIDER_SITE_OTHER): Payer: Medicare Other | Admitting: Behavioral Health

## 2017-04-24 DIAGNOSIS — E538 Deficiency of other specified B group vitamins: Secondary | ICD-10-CM

## 2017-04-24 DIAGNOSIS — Z87891 Personal history of nicotine dependence: Secondary | ICD-10-CM

## 2017-04-24 MED ORDER — CYANOCOBALAMIN 1000 MCG/ML IJ SOLN
1000.0000 ug | Freq: Once | INTRAMUSCULAR | Status: AC
  Administered 2017-04-24: 1000 ug via INTRAMUSCULAR

## 2017-04-24 NOTE — Progress Notes (Signed)
Pre visit review using our clinic review tool, if applicable. No additional management support is needed unless otherwise documented below in the visit note.  Patient came in clinic for monthly B12 injection. IM injection was given in the left deltoid. Patient tolerated it well. Next appointment 05/22/17 at 2:45 PM.

## 2017-04-26 ENCOUNTER — Other Ambulatory Visit: Payer: Self-pay | Admitting: Acute Care

## 2017-04-26 DIAGNOSIS — Z122 Encounter for screening for malignant neoplasm of respiratory organs: Secondary | ICD-10-CM

## 2017-04-26 DIAGNOSIS — Z87891 Personal history of nicotine dependence: Secondary | ICD-10-CM

## 2017-05-11 ENCOUNTER — Other Ambulatory Visit: Payer: Self-pay | Admitting: Family Medicine

## 2017-05-22 ENCOUNTER — Ambulatory Visit (INDEPENDENT_AMBULATORY_CARE_PROVIDER_SITE_OTHER): Payer: Medicare Other

## 2017-05-22 ENCOUNTER — Other Ambulatory Visit (INDEPENDENT_AMBULATORY_CARE_PROVIDER_SITE_OTHER): Payer: Medicare Other

## 2017-05-22 DIAGNOSIS — D72829 Elevated white blood cell count, unspecified: Secondary | ICD-10-CM

## 2017-05-22 DIAGNOSIS — E538 Deficiency of other specified B group vitamins: Secondary | ICD-10-CM | POA: Diagnosis not present

## 2017-05-22 LAB — CBC WITH DIFFERENTIAL/PLATELET
Basophils Absolute: 0 10*3/uL (ref 0.0–0.1)
Basophils Relative: 0.3 % (ref 0.0–3.0)
Eosinophils Absolute: 0.1 10*3/uL (ref 0.0–0.7)
Eosinophils Relative: 1 % (ref 0.0–5.0)
HCT: 39.6 % (ref 36.0–46.0)
Hemoglobin: 13 g/dL (ref 12.0–15.0)
Lymphocytes Relative: 24.6 % (ref 12.0–46.0)
Lymphs Abs: 3.3 10*3/uL (ref 0.7–4.0)
MCHC: 32.9 g/dL (ref 30.0–36.0)
MCV: 84.6 fl (ref 78.0–100.0)
Monocytes Absolute: 0.6 10*3/uL (ref 0.1–1.0)
Monocytes Relative: 4.4 % (ref 3.0–12.0)
Neutro Abs: 9.4 10*3/uL — ABNORMAL HIGH (ref 1.4–7.7)
Neutrophils Relative %: 69.7 % (ref 43.0–77.0)
Platelets: 287 10*3/uL (ref 150.0–400.0)
RBC: 4.68 Mil/uL (ref 3.87–5.11)
RDW: 15.7 % — ABNORMAL HIGH (ref 11.5–15.5)
WBC: 13.4 10*3/uL — ABNORMAL HIGH (ref 4.0–10.5)

## 2017-05-22 MED ORDER — CYANOCOBALAMIN 1000 MCG/ML IJ SOLN
1000.0000 ug | Freq: Once | INTRAMUSCULAR | Status: AC
  Administered 2017-05-22: 1000 ug via INTRAMUSCULAR

## 2017-05-22 NOTE — Progress Notes (Signed)
Pre visit review using our clinic tool,if applicable. No additional management support is needed unless otherwise documented below in the visit note.   Patient in for B12 injection per order from Dr. Carollee Herter due to patient having B12 deficiency.  Given 1000 mcg IM Right deltoid. Patient tolerated well.  Return appointment given for 1 month.

## 2017-05-23 ENCOUNTER — Telehealth: Payer: Self-pay | Admitting: Family Medicine

## 2017-05-23 ENCOUNTER — Encounter: Payer: Self-pay | Admitting: *Deleted

## 2017-05-23 ENCOUNTER — Other Ambulatory Visit: Payer: Self-pay

## 2017-05-23 DIAGNOSIS — D729 Disorder of white blood cells, unspecified: Secondary | ICD-10-CM

## 2017-05-23 NOTE — Telephone Encounter (Signed)
Pt  Called  Regarding    Her  Lab  Results    Results   Given   To   Patient   .   Pt   Denies   Any   Symptoms  Of  Any acute  Infection .   She  States   She is  A  Little  Tired  That's  She  Attributes  It to  [place] .

## 2017-05-24 NOTE — Telephone Encounter (Signed)
FYI

## 2017-05-29 ENCOUNTER — Encounter: Payer: Self-pay | Admitting: Family Medicine

## 2017-05-29 ENCOUNTER — Ambulatory Visit: Payer: Medicare Other | Admitting: Family Medicine

## 2017-05-29 VITALS — BP 128/78 | HR 82 | Temp 97.4°F | Ht 66.0 in | Wt 164.0 lb

## 2017-05-29 DIAGNOSIS — D729 Disorder of white blood cells, unspecified: Secondary | ICD-10-CM | POA: Diagnosis not present

## 2017-05-29 DIAGNOSIS — R350 Frequency of micturition: Secondary | ICD-10-CM | POA: Diagnosis not present

## 2017-05-29 DIAGNOSIS — N3943 Post-void dribbling: Secondary | ICD-10-CM | POA: Diagnosis not present

## 2017-05-29 LAB — POC URINALSYSI DIPSTICK (AUTOMATED)
Bilirubin, UA: NEGATIVE
Blood, UA: NEGATIVE
Glucose, UA: NEGATIVE
Ketones, UA: NEGATIVE
Leukocytes, UA: NEGATIVE
Nitrite, UA: NEGATIVE
Protein, UA: NEGATIVE
Spec Grav, UA: 1.02 (ref 1.010–1.025)
Urobilinogen, UA: 0.2 E.U./dL
pH, UA: 6 (ref 5.0–8.0)

## 2017-05-29 MED ORDER — SOLIFENACIN SUCCINATE 10 MG PO TABS: 10.0000 mg | ORAL_TABLET | Freq: Every day | ORAL | 11 refills | Status: DC

## 2017-05-29 NOTE — Patient Instructions (Signed)

## 2017-05-29 NOTE — Assessment & Plan Note (Signed)
Pt to see hematology 12/26

## 2017-05-29 NOTE — Progress Notes (Signed)
Subjective:  I acted as a Education administrator for [employer], CMA   Patient ID: Alyssa Ford, female    DOB: 01/25/1951, 66 y.o.   MRN: [id number]  Chief Complaint  Patient presents with  . Referral    hema  . Bladder Prolapse    Pt states that she's having bladder leakage that began about 6 months ago    HPI  Patient is in today for bladder leakage and odor to urine.  No dysuria.  Symptoms of leakage for 6 months.   Pt also wants to discuss CBC and referral to hematology.    Patient Care Team: Carollee Herter, Alferd Apa, DO as PCP - General   Past Medical History:  Diagnosis Date  . Anemia   . Arthritis   . Cataract   . Chronic headaches   . Diabetes mellitus   . Diverticulosis of colon (without mention of hemorrhage) 2003, 2008   Colonoscopy  . GERD (gastroesophageal reflux disease)   . Glaucoma   . Hiatal hernia 2003   EGD   . Hyperlipidemia   . Hypertension   . Thyroid nodule     Past Surgical History:  Procedure Laterality Date  . CARPAL TUNNEL RELEASE  2006  . CARPAL TUNNEL RELEASE  12/24/2007  . CERVICAL FUSION  3/08  . CHOLECYSTECTOMY      Family History  Problem Relation Age of Onset  . Dementia Mother   . Breast cancer Mother 78  . Kidney failure Mother 109       died from kidney failure from abx given to her for pneumonia  . Hyperlipidemia Mother   . Hypertension Mother   . Diabetes Mother   . Cancer Mother 26       breast  . Parkinsonism Father   . Diabetes Father 29  . Hypertension Sister   . Diabetes Brother   . Hypertension Brother   . Heart disease Cousin 47       mi  . Colon cancer Neg Hx     Social History   Socioeconomic History  . Marital status: Married    Spouse name: Not on file  . Number of children: Not on file  . Years of education: Not on file  . Highest education level: Not on file  Social Needs  . Financial resource strain: Not on file  . Food insecurity - worry: Not on file  . Food insecurity - inability: Not on  file  . Transportation needs - medical: Not on file  . Transportation needs - non-medical: Not on file  Occupational History  . Occupation: retired  Tobacco Use  . Smoking status: Former Smoker    Packs/day: 1.40    Years: 49.00    Pack years: 68.60    Types: Cigarettes    Last attempt to quit: 07/16/2015    Years since quitting: 1.8  . Smokeless tobacco: Never Used  . Tobacco comment: e cig  Substance and Sexual Activity  . Alcohol use: Yes    Alcohol/week: 0.0 oz    Comment: once weekly; wine and beer  . Drug use: No  . Sexual activity: Yes    Partners: Male  Other Topics Concern  . Not on file  Social History Narrative   Exercise--  walk    Outpatient Medications Prior to Visit  Medication Sig Dispense Refill  . amitriptyline (ELAVIL) 25 MG tablet TAKE 2 TABLETS BY MOUTH ONCE DAILY 60 tablet 5  . atorvastatin (LIPITOR) 80 MG  tablet TAKE 1 TABLET BY MOUTH ONCE DAILY 90 tablet 0  . BIOTIN PO Take 1 tablet by mouth daily.     . bromocriptine (PARLODEL) 2.5 MG tablet 1/4 tab daily 8 tablet 11  . buPROPion (WELLBUTRIN XL) 150 MG 24 hr tablet TAKE TWO TABLETS BY MOUTH ONCE DAILY IN THE MORNING 180 tablet 3  . diazepam (VALIUM) 5 MG tablet TAKE ONE TABLET BY MOUTH AT BEDTIME AS NEEDED FOR ANXIETY 30 tablet 2  . Ferrous Sulfate (IRON SUPPLEMENT PO) Take 1 tablet by mouth daily.    . hydrochlorothiazide (HYDRODIURIL) 25 MG tablet TAKE ONE TABLET BY MOUTH ONCE DAILY 30 tablet 8  . latanoprost (XALATAN) 0.005 % ophthalmic solution Place 1 drop into both eyes at bedtime.     . metformin (FORTAMET) 1000 MG (OSM) 24 hr tablet Take 2 tablets (2,000 mg total) by mouth daily with breakfast. 180 tablet 1  . Naproxen Sodium (ALEVE PO) Take 2 tablets by mouth daily.    Marland Kitchen omeprazole (PRILOSEC) 40 MG capsule TAKE ONE CAPSULE BY MOUTH ONCE DAILY 90 capsule 3  . repaglinide (PRANDIN) 2 MG tablet Take 1 tablet (2 mg total) by mouth 3 (three) times daily before meals. 90 tablet 11    Facility-Administered Medications Prior to Visit  Medication Dose Route Frequency Provider Last Rate Last Dose  . 0.9 %  sodium chloride infusion  500 mL Intravenous Continuous Nandigam, Venia Minks, MD        Allergies  Allergen Reactions  . Codeine Nausea Only    Severe nausea    Review of Systems  Constitutional: Negative for chills, fever and malaise/fatigue.  HENT: Negative for congestion and hearing loss.   Eyes: Negative for discharge.  Respiratory: Negative for cough, sputum production and shortness of breath.   Cardiovascular: Negative for chest pain, palpitations and leg swelling.  Gastrointestinal: Negative for abdominal pain, blood in stool, constipation, diarrhea, heartburn, nausea and vomiting.  Genitourinary: Positive for frequency. Negative for dysuria, hematuria and urgency.  Musculoskeletal: Negative for back pain, falls and myalgias.  Skin: Negative for rash.  Neurological: Negative for dizziness, sensory change, loss of consciousness, weakness and headaches.  Endo/Heme/Allergies: Negative for environmental allergies. Does not bruise/bleed easily.  Psychiatric/Behavioral: Negative for depression and suicidal ideas. The patient is not nervous/anxious and does not have insomnia.        Objective:    Physical Exam  Constitutional: She is oriented to person, place, and time. She appears well-developed and well-nourished.  HENT:  Head: Normocephalic and atraumatic.  Eyes: Conjunctivae and EOM are normal.  Neck: Normal range of motion. Neck supple. No JVD present. Carotid bruit is not present. No thyromegaly present.  Cardiovascular: Normal rate, regular rhythm and normal heart sounds.  No murmur heard. Pulmonary/Chest: Effort normal and breath sounds normal. No respiratory distress. She has no wheezes. She has no rales. She exhibits no tenderness.  Abdominal: Soft. She exhibits no mass. There is no tenderness. There is no rebound and no guarding.   Musculoskeletal: She exhibits no edema.  Neurological: She is alert and oriented to person, place, and time.  Psychiatric: She has a normal mood and affect.  Nursing note and vitals reviewed.   BP 128/78   Pulse 82   Temp (!) 97.4 F (36.3 C) (Oral)   Ht 5\' 6"  (1.676 m)   Wt 164 lb (74.4 kg)   SpO2 98%   BMI 26.47 kg/m  Wt Readings from Last 3 Encounters:  05/29/17 164 lb (74.4 kg)  03/30/17 160 lb 12.8 oz (72.9 kg)  12/28/16 161 lb (73 kg)   BP Readings from Last 3 Encounters:  05/29/17 128/78  03/30/17 108/72  12/28/16 138/72     Immunization History  Administered Date(s) Administered  . Influenza Split 04/13/2011  . Influenza Whole 04/17/2006  . Influenza, High Dose Seasonal PF 04/12/2016, 05/07/2017  . Influenza, Seasonal, Injecte, Preservative Fre 05/22/2012  . Influenza,inj,Quad PF,6+ Mos 03/03/2014, 03/18/2015  . Influenza-Unspecified 05/07/2017  . Pneumococcal Conjugate-13 09/13/2015, 05/07/2017  . Pneumococcal Polysaccharide-23 03/31/2003, 11/03/2008, 05/07/2017  . Td 10/24/1999, 02/23/2010  . Zoster 12/25/2011  . Zoster Recombinat (Shingrix) 03/09/2017    Health Maintenance  Topic Date Due  . FOOT EXAM  02/24/2017  . URINE MICROALBUMIN  04/28/2017  . COLONOSCOPY  09/11/2017  . HEMOGLOBIN A1C  09/28/2017  . OPHTHALMOLOGY EXAM  01/11/2018  . PNA vac Low Risk Adult (2 of 2 - PPSV23) 05/07/2018  . MAMMOGRAM  04/20/2019  . TETANUS/TDAP  02/24/2020  . INFLUENZA VACCINE  Completed  . DEXA SCAN  Completed  . Hepatitis C Screening  Completed    Lab Results  Component Value Date   WBC 13.4 (H) 05/22/2017   HGB 13.0 05/22/2017   HCT 39.6 05/22/2017   PLT 287.0 05/22/2017   GLUCOSE 138 (H) 11/23/2016   CHOL 133 11/23/2016   TRIG 106.0 11/23/2016   HDL 45.70 11/23/2016   LDLDIRECT 197.0 08/25/2011   LDLCALC 66 11/23/2016   ALT 25 11/23/2016   AST 17 11/23/2016   NA 137 11/23/2016   K 3.6 11/23/2016   CL 96 11/23/2016   CREATININE 0.82 11/23/2016    BUN 15 11/23/2016   CO2 32 11/23/2016   TSH 2.89 03/18/2015   HGBA1C 7.0 03/30/2017   MICROALBUR 1.4 04/28/2016    Lab Results  Component Value Date   TSH 2.89 03/18/2015   Lab Results  Component Value Date   WBC 13.4 (H) 05/22/2017   HGB 13.0 05/22/2017   HCT 39.6 05/22/2017   MCV 84.6 05/22/2017   PLT 287.0 05/22/2017   Lab Results  Component Value Date   NA 137 11/23/2016   K 3.6 11/23/2016   CO2 32 11/23/2016   GLUCOSE 138 (H) 11/23/2016   BUN 15 11/23/2016   CREATININE 0.82 11/23/2016   BILITOT 0.3 11/23/2016   ALKPHOS 96 11/23/2016   AST 17 11/23/2016   ALT 25 11/23/2016   PROT 7.4 11/23/2016   ALBUMIN 4.4 11/23/2016   CALCIUM 9.6 11/23/2016   GFR 73.98 11/23/2016   Lab Results  Component Value Date   CHOL 133 11/23/2016   Lab Results  Component Value Date   HDL 45.70 11/23/2016   Lab Results  Component Value Date   LDLCALC 66 11/23/2016   Lab Results  Component Value Date   TRIG 106.0 11/23/2016   Lab Results  Component Value Date   CHOLHDL 3 11/23/2016   Lab Results  Component Value Date   HGBA1C 7.0 03/30/2017         Assessment & Plan:   Problem List Items Addressed This Visit      Unprioritized   Abnormal WBC count    Pt to see hematology 12/26       Other Visit Diagnoses    Frequency of urination    -  Primary   Relevant Orders   POCT Urinalysis Dipstick (Automated) (Completed)   Post-void dribbling       Relevant Medications   solifenacin (VESICARE) 10 MG tablet  if no results with vesicare-=-- can try myrbetriq and then refer to urology if that does not help Pt agrees with plan  I am having Adaleah H. Broxson start on solifenacin. I am also having her maintain her BIOTIN PO, Naproxen Sodium (ALEVE PO), latanoprost, buPROPion, diazepam, hydrochlorothiazide, Ferrous Sulfate (IRON SUPPLEMENT PO), omeprazole, repaglinide, bromocriptine, amitriptyline, metformin, and atorvastatin. We will continue to administer sodium  chloride.  Meds ordered this encounter  Medications  . solifenacin (VESICARE) 10 MG tablet    Sig: Take 1 tablet (10 mg total) by mouth daily.    Dispense:  30 tablet    Refill:  11    CMA served as scribe during this visit. History, Physical and Plan performed by medical provider. Documentation and orders reviewed and attested to.  Ann Held, DO

## 2017-06-07 DIAGNOSIS — E119 Type 2 diabetes mellitus without complications: Secondary | ICD-10-CM | POA: Diagnosis not present

## 2017-06-07 DIAGNOSIS — J0100 Acute maxillary sinusitis, unspecified: Secondary | ICD-10-CM | POA: Diagnosis not present

## 2017-06-15 ENCOUNTER — Other Ambulatory Visit: Payer: Self-pay

## 2017-06-15 MED ORDER — REPAGLINIDE 2 MG PO TABS: 2.0000 mg | ORAL_TABLET | Freq: Three times a day (TID) | ORAL | 11 refills | Status: DC

## 2017-06-20 ENCOUNTER — Other Ambulatory Visit (HOSPITAL_BASED_OUTPATIENT_CLINIC_OR_DEPARTMENT_OTHER): Payer: Medicare Other

## 2017-06-20 ENCOUNTER — Ambulatory Visit (HOSPITAL_BASED_OUTPATIENT_CLINIC_OR_DEPARTMENT_OTHER): Payer: Medicare Other | Admitting: Hematology & Oncology

## 2017-06-20 ENCOUNTER — Other Ambulatory Visit: Payer: Self-pay

## 2017-06-20 ENCOUNTER — Encounter: Payer: Self-pay | Admitting: Hematology & Oncology

## 2017-06-20 VITALS — BP 120/63 | HR 80 | Temp 98.4°F | Resp 16 | Wt 159.0 lb

## 2017-06-20 DIAGNOSIS — Z87891 Personal history of nicotine dependence: Secondary | ICD-10-CM

## 2017-06-20 DIAGNOSIS — D72829 Elevated white blood cell count, unspecified: Secondary | ICD-10-CM

## 2017-06-20 DIAGNOSIS — E611 Iron deficiency: Secondary | ICD-10-CM | POA: Diagnosis not present

## 2017-06-20 DIAGNOSIS — E119 Type 2 diabetes mellitus without complications: Secondary | ICD-10-CM | POA: Diagnosis not present

## 2017-06-20 DIAGNOSIS — E785 Hyperlipidemia, unspecified: Secondary | ICD-10-CM

## 2017-06-20 DIAGNOSIS — Z803 Family history of malignant neoplasm of breast: Secondary | ICD-10-CM

## 2017-06-20 LAB — CBC WITH DIFFERENTIAL (CANCER CENTER ONLY)
BASO#: 0.1 10*3/uL (ref 0.0–0.2)
BASO%: 0.6 % (ref 0.0–2.0)
EOS%: 2 % (ref 0.0–7.0)
Eosinophils Absolute: 0.2 10*3/uL (ref 0.0–0.5)
HCT: 37.5 % (ref 34.8–46.6)
HGB: 12.7 g/dL (ref 11.6–15.9)
LYMPH#: 3.2 10*3/uL (ref 0.9–3.3)
LYMPH%: 33.1 % (ref 14.0–48.0)
MCH: 28.9 pg (ref 26.0–34.0)
MCHC: 33.9 g/dL (ref 32.0–36.0)
MCV: 85 fL (ref 81–101)
MONO#: 0.7 10*3/uL (ref 0.1–0.9)
MONO%: 6.9 % (ref 0.0–13.0)
NEUT#: 5.5 10*3/uL (ref 1.5–6.5)
NEUT%: 57.4 % (ref 39.6–80.0)
Platelets: 273 10*3/uL (ref 145–400)
RBC: 4.4 10*6/uL (ref 3.70–5.32)
RDW: 14.8 % (ref 11.1–15.7)
WBC: 9.7 10*3/uL (ref 3.9–10.0)

## 2017-06-20 LAB — CHCC SATELLITE - SMEAR

## 2017-06-20 NOTE — Progress Notes (Signed)
Referral MD  Reason for Referral: Transient leukocytosis  Chief Complaint  Patient presents with  . New Patient (Initial Visit)  : I do not know why I am here.  HPI: Ms. Guallpa is a very charming 66 year old white female.  She does have diabetes.  She is on metformin.  She does have fasciitis.  She has had steroid injections into her hand.  She has a trigger finger.  She apparently has pre-cancerous polyps.  She had a colonoscopy back in October.  She is due for another one year from now.  She is up-to-date with her mammograms.  She sees Dr. Etter Sjogren.  She was found to have some mild leukocytosis.  Going back to August 2018, her white cell count was 13.  In November, the white cell count was 13.  She is not anemic.  She has no thrombocytosis.  She has a normal white blood cell differential.  Back in August 2014, showed a white cell count of 15.1.  Because of the mild leukocytosis, was felt that she needed a hematologic evaluation.  She said that she has been taking vitamin B12 injections.  I am not sure if she truly has pernicious anemia.  She does have iron deficiency.  Back in August, her ferritin was only 7.  Her iron saturation was 13%.  She is on oral iron.  She used to smoke.  She probably has about a 40-pack-year history of tobacco use.  She stopped 2 years ago.  She used to work for [employer].  She has had no rashes.  There is no swollen lymph nodes.  She has had no nausea or vomiting.  She had no cough or shortness of breath.  She has had no leg swelling.  She has had no obvious change in bowel or bladder habits.  Currently, her performance status is ECOG 0.   Past Medical History:  Diagnosis Date  . Anemia   . Arthritis   . Cataract   . Chronic headaches   . Diabetes mellitus   . Diverticulosis of colon (without mention of hemorrhage) 2003, 2008   Colonoscopy  . GERD (gastroesophageal reflux disease)   . Glaucoma   . Hiatal hernia 2003   EGD   .  Hyperlipidemia   . Hypertension   . Thyroid nodule   :  Past Surgical History:  Procedure Laterality Date  . CARPAL TUNNEL RELEASE  2006  . CARPAL TUNNEL RELEASE  12/24/2007  . CERVICAL FUSION  3/08  . CHOLECYSTECTOMY    :   Current Outpatient Medications:  .  amitriptyline (ELAVIL) 25 MG tablet, TAKE 2 TABLETS BY MOUTH ONCE DAILY, Disp: 60 tablet, Rfl: 5 .  atorvastatin (LIPITOR) 80 MG tablet, TAKE 1 TABLET BY MOUTH ONCE DAILY, Disp: 90 tablet, Rfl: 0 .  BIOTIN PO, Take 1 tablet by mouth daily. , Disp: , Rfl:  .  bromocriptine (PARLODEL) 2.5 MG tablet, 1/4 tab daily, Disp: 8 tablet, Rfl: 11 .  diazepam (VALIUM) 5 MG tablet, TAKE ONE TABLET BY MOUTH AT BEDTIME AS NEEDED FOR ANXIETY, Disp: 30 tablet, Rfl: 2 .  Ferrous Sulfate (IRON SUPPLEMENT PO), Take 1 tablet by mouth daily., Disp: , Rfl:  .  hydrochlorothiazide (HYDRODIURIL) 25 MG tablet, TAKE ONE TABLET BY MOUTH ONCE DAILY, Disp: 30 tablet, Rfl: 8 .  latanoprost (XALATAN) 0.005 % ophthalmic solution, Place 1 drop into both eyes at bedtime. , Disp: , Rfl:  .  metformin (FORTAMET) 1000 MG (OSM) 24 hr tablet, Take 2  tablets (2,000 mg total) by mouth daily with breakfast., Disp: 180 tablet, Rfl: 1 .  Naproxen Sodium (ALEVE PO), Take 2 tablets by mouth daily., Disp: , Rfl:  .  omeprazole (PRILOSEC) 40 MG capsule, TAKE ONE CAPSULE BY MOUTH ONCE DAILY, Disp: 90 capsule, Rfl: 3 .  repaglinide (PRANDIN) 2 MG tablet, Take 1 tablet (2 mg total) by mouth 3 (three) times daily before meals., Disp: 90 tablet, Rfl: 11 .  solifenacin (VESICARE) 10 MG tablet, Take 1 tablet (10 mg total) by mouth daily., Disp: 30 tablet, Rfl: 11  Current Facility-Administered Medications:  .  0.9 %  sodium chloride infusion, 500 mL, Intravenous, Continuous, Nandigam, Kavitha V, MD:  :  Allergies  Allergen Reactions  . Codeine Nausea Only    Severe nausea  :  Family History  Problem Relation Age of Onset  . Dementia Mother   . Breast cancer Mother 70  .  Kidney failure Mother 67       died from kidney failure from abx given to her for pneumonia  . Hyperlipidemia Mother   . Hypertension Mother   . Diabetes Mother   . Cancer Mother 49       breast  . Parkinsonism Father   . Diabetes Father 61  . Hypertension Sister   . Diabetes Brother   . Hypertension Brother   . Heart disease Cousin 33       mi  . Colon cancer Neg Hx   :  Social History   Socioeconomic History  . Marital status: Married    Spouse name: Not on file  . Number of children: Not on file  . Years of education: Not on file  . Highest education level: Not on file  Social Needs  . Financial resource strain: Not on file  . Food insecurity - worry: Not on file  . Food insecurity - inability: Not on file  . Transportation needs - medical: Not on file  . Transportation needs - non-medical: Not on file  Occupational History  . Occupation: retired  Tobacco Use  . Smoking status: Former Smoker    Packs/day: 1.40    Years: 49.00    Pack years: 68.60    Types: Cigarettes    Last attempt to quit: 07/16/2015    Years since quitting: 1.9  . Smokeless tobacco: Never Used  . Tobacco comment: e cig  Substance and Sexual Activity  . Alcohol use: Yes    Alcohol/week: 0.0 oz    Comment: once weekly; wine and beer  . Drug use: No  . Sexual activity: Yes    Partners: Male  Other Topics Concern  . Not on file  Social History Narrative   Exercise--  walk  :  Pertinent items are noted in HPI.  Exam: Well-developed and well-nourished white female in no obvious distress.  Vital signs show temperature of 98.4.  Pulse 80.  Blood pressure 120/63.  Weight is 159 pounds.  Head exam shows no ocular or ora lesions.  She has no palpable cervical or supraclavicular lymph nodes.  Lungs are clear bilaterally.  Cardiac exam regular rate and rhythm with no murmurs, rubs or bruits.  Abdomen is soft.  She has good bowel sounds.  There is no fluid wave.  There is no palpable liver or spleen  tip.  Back exam shows no tenderness over the spine, ribs or hips.  Extremities shows no clubbing, cyanosis or edema.  Neurological exam shows no focal neurological deficits.  Skin exam  shows no rashes, ecchymoses or petechia. @IPVITALS @   Recent Labs    06/20/17 1057  WBC 9.7  HGB 12.7  HCT 37.5  PLT 273   No results for input(s): NA, K, CL, CO2, GLUCOSE, BUN, CREATININE, CALCIUM in the last 72 hours.  Blood smear review: Normochromic and normocytic palpation of red blood cells.  There are no nucleated red blood cells.  There are no teardrop cells.  I see no rouleaux formation.  She has no inclusion bodies in the red cells.  White cells are normal in morphology and maturation.  I see no immature myeloid or lymphoid forms.  Platelets are adequate in number and size.  Pathology: None    Assessment and Plan: Alyssa Ford is a 66 year old white female.  She had transient leukocytosis.  I have to believe that this is reactive.  I find nothing on her exam or blood smear that looks like a primary hematologic disorder.  I do not see anything that would suggest a myeloproliferative disorder.  She does not need a bone marrow test.  She does not need any scans.  As nice as she is, I just do not think we have to get her back to our office.  I suspect she may have transient leukocytosis.  She does have quite a few medical problems.  She does have this trigger finger so this might cause some white cell elevation on occasion.  She felt much better after seeing [place].  I reassured her that I just did not think that she had a problem that we had to address.  I spent about 45 minutes with her.  I answered all of her questions.  I mostly just reassured her that I felt that she would be okay.

## 2017-06-21 ENCOUNTER — Ambulatory Visit (INDEPENDENT_AMBULATORY_CARE_PROVIDER_SITE_OTHER): Payer: Medicare Other

## 2017-06-21 DIAGNOSIS — E538 Deficiency of other specified B group vitamins: Secondary | ICD-10-CM | POA: Diagnosis not present

## 2017-06-21 MED ORDER — CYANOCOBALAMIN 1000 MCG/ML IJ SOLN
1000.0000 ug | Freq: Once | INTRAMUSCULAR | Status: AC
  Administered 2017-06-21: 1000 ug via INTRAMUSCULAR

## 2017-06-21 NOTE — Progress Notes (Signed)
Pre visit review using our clinic tool,if applicable. No additional management support is needed unless otherwise documented below in the visit note.   Patient in for B12 injection per order from Dr. Carollee Herter due to patient having B12 deficiency.  Given 1000 mcg IM left deltoid. Patient tolerated well  No complaints voiced this visit.   Return appointment scheduled for 1 month.

## 2017-07-03 ENCOUNTER — Encounter: Payer: Self-pay | Admitting: *Deleted

## 2017-07-12 ENCOUNTER — Other Ambulatory Visit: Payer: Self-pay | Admitting: Family Medicine

## 2017-07-12 DIAGNOSIS — F411 Generalized anxiety disorder: Secondary | ICD-10-CM

## 2017-07-12 DIAGNOSIS — F329 Major depressive disorder, single episode, unspecified: Secondary | ICD-10-CM

## 2017-07-12 DIAGNOSIS — F32A Depression, unspecified: Secondary | ICD-10-CM

## 2017-07-13 NOTE — Telephone Encounter (Signed)
Requesting:valium Contract:yes UDS:low risk next screen 11/31/18 Last OV:05/29/17 Next OV:not one  Last Refill:10/27/16  #[id number]   Please advise

## 2017-07-20 ENCOUNTER — Ambulatory Visit (INDEPENDENT_AMBULATORY_CARE_PROVIDER_SITE_OTHER): Payer: Medicare Other

## 2017-07-20 DIAGNOSIS — E538 Deficiency of other specified B group vitamins: Secondary | ICD-10-CM

## 2017-07-20 MED ORDER — CYANOCOBALAMIN 1000 MCG/ML IJ SOLN
1000.0000 ug | Freq: Once | INTRAMUSCULAR | Status: AC
Start: 2017-07-20 — End: 2017-07-20
  Administered 2017-07-20: 1000 ug via INTRAMUSCULAR

## 2017-07-20 NOTE — Progress Notes (Signed)
Pre visit review using our clinic tool,if applicable. No additional management support is needed unless otherwise documented below in the visit note.   Patient in for B12 injection per order from Dr. Floy Sabina due to patient having B-12 deficiency.  Given 1000 mcg IM right deltoid. Patient tolerated well.  Next appointment scheduled for  1 month.

## 2017-07-20 NOTE — Progress Notes (Signed)
Reviewed  Yvonne R Lowne Chase, DO  

## 2017-07-31 ENCOUNTER — Encounter: Payer: Self-pay | Admitting: Endocrinology

## 2017-07-31 ENCOUNTER — Ambulatory Visit: Payer: Medicare Other | Admitting: Endocrinology

## 2017-07-31 VITALS — BP 142/82 | HR 84 | Wt 161.8 lb

## 2017-07-31 DIAGNOSIS — E1151 Type 2 diabetes mellitus with diabetic peripheral angiopathy without gangrene: Secondary | ICD-10-CM

## 2017-07-31 DIAGNOSIS — E1165 Type 2 diabetes mellitus with hyperglycemia: Secondary | ICD-10-CM

## 2017-07-31 DIAGNOSIS — IMO0002 Reserved for concepts with insufficient information to code with codable children: Secondary | ICD-10-CM

## 2017-07-31 LAB — POCT GLYCOSYLATED HEMOGLOBIN (HGB A1C): Hemoglobin A1C: 6.8

## 2017-07-31 MED ORDER — REPAGLINIDE 2 MG PO TABS: ORAL_TABLET | ORAL | 11 refills | Status: DC

## 2017-07-31 NOTE — Progress Notes (Signed)
Subjective:    Patient ID: ARIANNY PUN, female    DOB: [birth year], 67 y.o.   MRN: [id number]  HPI Pt returns for f/u of diabetes mellitus: DM type: 2 Dx'ed: 1951 Complications: polyneuropathy.  Therapy: 3 oral meds GDM: never DKA: never Severe hypoglycemia: never Pancreatitis: never Pancreatic imaging: never Other: she has never been on insulin; she wants generic meds, if possible.   Interval history: Pt states cbg's vary from 70-149.  It is in general lowest at lunch.   pt states she feels well in general. Past Medical History:  Diagnosis Date  . Anemia   . Arthritis   . Cataract   . Chronic headaches   . Diabetes mellitus   . Diverticulosis of colon (without mention of hemorrhage) 2003, 2008   Colonoscopy  . GERD (gastroesophageal reflux disease)   . Glaucoma   . Hiatal hernia 2003   EGD   . Hyperlipidemia   . Hypertension   . Thyroid nodule     Past Surgical History:  Procedure Laterality Date  . CARPAL TUNNEL RELEASE  2006  . CARPAL TUNNEL RELEASE  12/24/2007  . CERVICAL FUSION  3/08  . CHOLECYSTECTOMY      Social History   Socioeconomic History  . Marital status: Married    Spouse name: Not on file  . Number of children: Not on file  . Years of education: Not on file  . Highest education level: Not on file  Social Needs  . Financial resource strain: Not on file  . Food insecurity - worry: Not on file  . Food insecurity - inability: Not on file  . Transportation needs - medical: Not on file  . Transportation needs - non-medical: Not on file  Occupational History  . Occupation: retired  Tobacco Use  . Smoking status: Former Smoker    Packs/day: 1.40    Years: 49.00    Pack years: 68.60    Types: Cigarettes    Last attempt to quit: 07/16/2015    Years since quitting: 2.0  . Smokeless tobacco: Never Used  . Tobacco comment: e cig  Substance and Sexual Activity  . Alcohol use: Yes    Alcohol/week: 0.0 oz    Comment: once weekly; wine and  beer  . Drug use: No  . Sexual activity: Yes    Partners: Male  Other Topics Concern  . Not on file  Social History Narrative   Exercise--  walk    Current Outpatient Medications on File Prior to Visit  Medication Sig Dispense Refill  . amitriptyline (ELAVIL) 25 MG tablet TAKE 2 TABLETS BY MOUTH ONCE DAILY 60 tablet 5  . atorvastatin (LIPITOR) 80 MG tablet TAKE 1 TABLET BY MOUTH ONCE DAILY 90 tablet 0  . BIOTIN PO Take 1 tablet by mouth daily.     . bromocriptine (PARLODEL) 2.5 MG tablet 1/4 tab daily 8 tablet 11  . buPROPion (WELLBUTRIN XL) 150 MG 24 hr tablet TAKE TWO TABLETS BY MOUTH ONCE DAILY IN THE MORNING 180 tablet 3  . diazepam (VALIUM) 5 MG tablet TAKE 1 TABLET BY MOUTH AT BEDTIME AS NEEDED FOR ANXIETY 30 tablet 2  . Ferrous Sulfate (IRON SUPPLEMENT PO) Take 1 tablet by mouth daily.    . hydrochlorothiazide (HYDRODIURIL) 25 MG tablet TAKE ONE TABLET BY MOUTH ONCE DAILY 30 tablet 8  . latanoprost (XALATAN) 0.005 % ophthalmic solution Place 1 drop into both eyes at bedtime.     . metformin (FORTAMET) 1000 MG (OSM) 24  hr tablet Take 2 tablets (2,000 mg total) by mouth daily with breakfast. 180 tablet 1  . Naproxen Sodium (ALEVE PO) Take 2 tablets by mouth daily.    Marland Kitchen omeprazole (PRILOSEC) 40 MG capsule TAKE ONE CAPSULE BY MOUTH ONCE DAILY 90 capsule 3  . solifenacin (VESICARE) 10 MG tablet Take 1 tablet (10 mg total) by mouth daily. 30 tablet 11   Current Facility-Administered Medications on File Prior to Visit  Medication Dose Route Frequency Provider Last Rate Last Dose  . 0.9 %  sodium chloride infusion  500 mL Intravenous Continuous Nandigam, Venia Minks, MD        Allergies  Allergen Reactions  . Codeine Nausea Only    Severe nausea    Family History  Problem Relation Age of Onset  . Dementia Mother   . Breast cancer Mother 89  . Kidney failure Mother 46       died from kidney failure from abx given to her for pneumonia  . Hyperlipidemia Mother   . Hypertension  Mother   . Diabetes Mother   . Cancer Mother 31       breast  . Parkinsonism Father   . Diabetes Father 72  . Hypertension Sister   . Diabetes Brother   . Hypertension Brother   . Heart disease Cousin 85       mi  . Colon cancer Neg Hx     BP (!) 142/82 (BP Location: Left Arm, Patient Position: Sitting, Cuff Size: Normal)   Pulse 84   Wt 161 lb 12.8 oz (73.4 kg)   SpO2 94%   BMI 26.12 kg/m    Review of Systems She denies hypoglycemia    Objective:   Physical Exam VITAL SIGNS:  See vs page GENERAL: no distress Pulses: dorsalis pedis intact bilat.   MSK: no deformity of the feet CV: no leg edema Skin:  no ulcer on the feet.  normal color and temp on the feet. Neuro: sensation is intact to touch on the feet  Lab Results  Component Value Date   HGBA1C 6.8 07/31/2017      Assessment & Plan:  Type 2 DM: slightly overcontrolled.  Patient Instructions  check your blood sugar once a day.  vary the time of day when you check, between before the 3 meals, and at bedtime.  also check if you have symptoms of your blood sugar being too high or too low.  please keep a record of the readings and bring it to your next appointment here (or you can bring the meter itself).  You can write it on any piece of paper.  please call us sooner if your blood sugar goes below 70, or if you have a lot of readings over 200.   Please reduce the lunch repaglinide to 1/2 pill, and: Please continue the same other diabetes medications.   Please come back for a follow-up appointment in 3 months.

## 2017-07-31 NOTE — Patient Instructions (Addendum)
check your blood sugar once a day.  vary the time of day when you check, between before the 3 meals, and at bedtime.  also check if you have symptoms of your blood sugar being too high or too low.  please keep a record of the readings and bring it to your next appointment here (or you can bring the meter itself).  You can write it on any piece of paper.  please call us sooner if your blood sugar goes below 70, or if you have a lot of readings over 200.   Please reduce the lunch repaglinide to 1/2 pill, and: Please continue the same other diabetes medications.   Please come back for a follow-up appointment in 3 months.

## 2017-08-01 ENCOUNTER — Other Ambulatory Visit: Payer: Self-pay | Admitting: Family Medicine

## 2017-08-06 DIAGNOSIS — M65341 Trigger finger, right ring finger: Secondary | ICD-10-CM | POA: Diagnosis not present

## 2017-08-06 DIAGNOSIS — M65342 Trigger finger, left ring finger: Secondary | ICD-10-CM | POA: Diagnosis not present

## 2017-08-10 ENCOUNTER — Other Ambulatory Visit: Payer: Self-pay | Admitting: Family Medicine

## 2017-08-17 ENCOUNTER — Ambulatory Visit: Payer: Medicare Other

## 2017-08-17 DIAGNOSIS — E119 Type 2 diabetes mellitus without complications: Secondary | ICD-10-CM | POA: Diagnosis not present

## 2017-08-17 DIAGNOSIS — H401131 Primary open-angle glaucoma, bilateral, mild stage: Secondary | ICD-10-CM | POA: Diagnosis not present

## 2017-08-17 DIAGNOSIS — Z961 Presence of intraocular lens: Secondary | ICD-10-CM | POA: Diagnosis not present

## 2017-08-21 ENCOUNTER — Ambulatory Visit (INDEPENDENT_AMBULATORY_CARE_PROVIDER_SITE_OTHER): Payer: Medicare Other

## 2017-08-21 DIAGNOSIS — E538 Deficiency of other specified B group vitamins: Secondary | ICD-10-CM | POA: Diagnosis not present

## 2017-08-21 MED ORDER — CYANOCOBALAMIN 1000 MCG/ML IJ SOLN
1000.0000 ug | Freq: Once | INTRAMUSCULAR | Status: AC
  Administered 2017-08-21: 1000 ug via INTRAMUSCULAR

## 2017-08-21 NOTE — Progress Notes (Addendum)
Patient presents today for B12 injection. Patient given injection in left arm as requested. Patient tolerated injection well. Next injection scheduled for 09/18/17.   noted Ann Held, DO

## 2017-09-03 ENCOUNTER — Ambulatory Visit: Payer: Medicare Other | Admitting: Podiatry

## 2017-09-06 ENCOUNTER — Ambulatory Visit: Payer: Medicare Other | Admitting: Podiatry

## 2017-09-06 ENCOUNTER — Encounter: Payer: Self-pay | Admitting: Podiatry

## 2017-09-06 DIAGNOSIS — B351 Tinea unguium: Secondary | ICD-10-CM

## 2017-09-06 DIAGNOSIS — L539 Erythematous condition, unspecified: Secondary | ICD-10-CM

## 2017-09-06 DIAGNOSIS — M79676 Pain in unspecified toe(s): Secondary | ICD-10-CM | POA: Diagnosis not present

## 2017-09-06 DIAGNOSIS — E119 Type 2 diabetes mellitus without complications: Secondary | ICD-10-CM

## 2017-09-06 NOTE — Patient Instructions (Signed)

## 2017-09-09 ENCOUNTER — Encounter: Payer: Self-pay | Admitting: Gastroenterology

## 2017-09-10 DIAGNOSIS — M65341 Trigger finger, right ring finger: Secondary | ICD-10-CM | POA: Diagnosis not present

## 2017-09-10 DIAGNOSIS — M65342 Trigger finger, left ring finger: Secondary | ICD-10-CM | POA: Diagnosis not present

## 2017-09-10 NOTE — Progress Notes (Signed)
Subjective: Alyssa Ford presents the office today for diabetic foot evaluation.  She states that she is doing well she has not has any open sores or any blistering.  She also states that her nails are discolored and somewhat thick and she cannot trim them herself.  She has no other concerns today.  She denies any claudication symptoms.Denies any systemic complaints such as fevers, chills, nausea, vomiting. No acute changes since last appointment, and no other complaints at this time.   Objective: AAO x3, NAD DP/PT pulses palpable bilaterally 2/4, CRT less than 3 seconds Sensation intact with Simms Weinstein monofilament Nails appear to be mildly hypertrophic, dystrophic with yellow discoloration.  Tenderness nails 1-5 bilaterally.  No edema, erythema, drainage or pus or any clinical signs of infection present. Mild erythema to the fifth toe is been rubbing inside of her shoe but there is no blistering or open sores present. No open lesions or pre-ulcerative lesions.  No pain with calf compression, swelling, warmth, erythema  Assessment: Patient presents for diabetic foot evaluation, symptomatic onychomycosis  Plan: -All treatment options discussed with the patient including all alternatives, risks, complications.  -Nails are debrided x10 without any complications or bleeding. -Monitor closely the skin erythema on the left fifth toe.  There is any breakdown or blistering call the office once this is not resolved next couple weeks.  We discussed the change in shoes. -Daily foot inspection discussed -Follow-up in 1 year as she is at low risk or sooner if needed.  If she needs her nails trimmed a more than happy to do this for her as well.  Call with any questions or concerns meantime. -Patient encouraged to call the office with any questions, concerns, change in symptoms.   Trula Slade DPM

## 2017-09-17 ENCOUNTER — Encounter: Payer: Self-pay | Admitting: Gastroenterology

## 2017-09-18 ENCOUNTER — Ambulatory Visit (INDEPENDENT_AMBULATORY_CARE_PROVIDER_SITE_OTHER): Payer: Medicare Other | Admitting: *Deleted

## 2017-09-18 ENCOUNTER — Telehealth: Payer: Self-pay | Admitting: *Deleted

## 2017-09-18 DIAGNOSIS — E538 Deficiency of other specified B group vitamins: Secondary | ICD-10-CM | POA: Diagnosis not present

## 2017-09-18 MED ORDER — CYANOCOBALAMIN 1000 MCG/ML IJ SOLN
1000.0000 ug | Freq: Once | INTRAMUSCULAR | Status: AC
  Administered 2017-09-18: 1000 ug via INTRAMUSCULAR

## 2017-09-18 NOTE — Telephone Encounter (Signed)
Pt came in for B12 injection today and asked to schedule cpe w/pap with Dr Carollee Herter.  Last CPE was 11/23/16 and pt is scheduled for Wellness Exam with RN on 12/17/17.  Is cpe appropriate or just follow up with pap smear (30 min)?  If CPE is appropriate, pt is requesting a morning appointment and there are no morning availabilities until further into July.  Please advise?

## 2017-09-18 NOTE — Telephone Encounter (Signed)
She can have cpe after 5/31

## 2017-09-18 NOTE — Progress Notes (Signed)
Pre visit review using our clinic review tool, if applicable. No additional management support is needed unless otherwise documented below in the visit note.  Pt here for B12 injection per Dr Carollee Herter.  B12 1000 mcg given IM, right deltoid and pt tolerated injection well.  Next B12 injection scheduled for 10/19/17 at 9:30am.

## 2017-09-19 NOTE — Telephone Encounter (Signed)
Can you call to schedule her.

## 2017-09-19 NOTE — Telephone Encounter (Signed)
Notified pt that first morning physical availability will be 02/05/18 at 10:45am. Appt scheduled.

## 2017-10-03 ENCOUNTER — Other Ambulatory Visit: Payer: Self-pay | Admitting: Medical

## 2017-10-05 ENCOUNTER — Other Ambulatory Visit: Payer: Self-pay

## 2017-10-05 ENCOUNTER — Telehealth: Payer: Self-pay | Admitting: Endocrinology

## 2017-10-05 MED ORDER — BROMOCRIPTINE MESYLATE 2.5 MG PO TABS
ORAL_TABLET | ORAL | 11 refills | Status: DC
Start: 2017-10-05 — End: 2018-03-04

## 2017-10-05 NOTE — Telephone Encounter (Signed)
I called patient & stated that we resent in prescription for her. I asked that she call with any further issues.

## 2017-10-05 NOTE — Telephone Encounter (Signed)
bromocriptine (PARLODEL) 2.5 MG tablet  [facility], [place]    Patient stated the pharmacy called and left message stating that the refill for this medication was declined and patient is not sure why.  Please advise

## 2017-10-15 ENCOUNTER — Other Ambulatory Visit: Payer: Self-pay | Admitting: *Deleted

## 2017-10-15 ENCOUNTER — Telehealth: Payer: Self-pay | Admitting: Endocrinology

## 2017-10-15 MED ORDER — METFORMIN HCL ER (OSM) 1000 MG PO TB24: 2000.0000 mg | ORAL_TABLET | Freq: Every day | ORAL | 1 refills | Status: DC

## 2017-10-15 NOTE — Telephone Encounter (Signed)
metformin (FORTAMET) 1000 MG (OSM) 24 hr tablet  Pharmacy stated that the patients insurance will not cover this medication and need to switch her,.  Please advise  [place] [address], [place] [phone] (Phone)

## 2017-10-15 NOTE — Telephone Encounter (Signed)
Spoke to pharmacist and he stated that metformin 500 XR was ordered last July for pt and is covered, please advise

## 2017-10-16 NOTE — Telephone Encounter (Signed)
Any extended-release metformin is OK.

## 2017-10-17 ENCOUNTER — Other Ambulatory Visit: Payer: Self-pay

## 2017-10-17 MED ORDER — METFORMIN HCL ER 500 MG PO TB24: ORAL_TABLET | ORAL | 11 refills | Status: DC

## 2017-10-17 NOTE — Telephone Encounter (Signed)
Ok, I refilled

## 2017-10-17 NOTE — Telephone Encounter (Signed)
Please see prescription I have routed to you & advise?

## 2017-10-19 ENCOUNTER — Ambulatory Visit (INDEPENDENT_AMBULATORY_CARE_PROVIDER_SITE_OTHER): Payer: Medicare Other

## 2017-10-19 DIAGNOSIS — E538 Deficiency of other specified B group vitamins: Secondary | ICD-10-CM | POA: Diagnosis not present

## 2017-10-19 MED ORDER — CYANOCOBALAMIN 1000 MCG/ML IJ SOLN
1000.0000 ug | Freq: Once | INTRAMUSCULAR | Status: AC
  Administered 2017-10-19: 1000 ug via INTRAMUSCULAR

## 2017-10-19 NOTE — Progress Notes (Signed)
Patient came in for her  Monthly b-12 injection she tolerated 1 mL in her right deltoid.   Patient  Next injection is scheduled for 11/20/17

## 2017-10-24 ENCOUNTER — Ambulatory Visit (AMBULATORY_SURGERY_CENTER): Payer: Self-pay | Admitting: *Deleted

## 2017-10-24 ENCOUNTER — Other Ambulatory Visit: Payer: Self-pay

## 2017-10-24 VITALS — Ht 66.0 in | Wt 158.0 lb

## 2017-10-24 DIAGNOSIS — Z86010 Personal history of colonic polyps: Secondary | ICD-10-CM

## 2017-10-24 MED ORDER — PEG-KCL-NACL-NASULF-NA ASC-C 140 G PO SOLR
1.0000 | Freq: Once | ORAL | 0 refills | Status: AC
Start: 2017-10-24 — End: 2017-10-24

## 2017-10-24 NOTE — Progress Notes (Signed)
Patient denies any allergies to eggs or soy. Patient denies any problems with anesthesia/sedation. Patient denies any oxygen use at home. Patient denies taking any diet/weight loss medications or blood thinners. EMMI education sent to pt's email for colonoscopy.

## 2017-11-02 ENCOUNTER — Encounter: Payer: Self-pay | Admitting: Endocrinology

## 2017-11-02 ENCOUNTER — Ambulatory Visit: Payer: Medicare Other | Admitting: Endocrinology

## 2017-11-02 VITALS — BP 122/62 | HR 83 | Wt 155.6 lb

## 2017-11-02 DIAGNOSIS — E1151 Type 2 diabetes mellitus with diabetic peripheral angiopathy without gangrene: Secondary | ICD-10-CM | POA: Diagnosis not present

## 2017-11-02 DIAGNOSIS — E1165 Type 2 diabetes mellitus with hyperglycemia: Secondary | ICD-10-CM

## 2017-11-02 DIAGNOSIS — IMO0002 Reserved for concepts with insufficient information to code with codable children: Secondary | ICD-10-CM

## 2017-11-02 LAB — POCT GLYCOSYLATED HEMOGLOBIN (HGB A1C): Hemoglobin A1C: 6.4

## 2017-11-02 NOTE — Progress Notes (Signed)
Subjective:    Patient ID: Alyssa Ford, female    DOB: [birth year], 67 y.o.   MRN: [id number]  HPI Pt returns for f/u of diabetes mellitus: DM type: 2 Dx'ed: 1856 Complications: polyneuropathy.  Therapy: 3 oral meds GDM: never DKA: never Severe hypoglycemia: never Pancreatitis: never Pancreatic imaging: never Other: she has never been on insulin; she wants generic meds, if possible.   Interval history: Pt states cbg's vary from 110-160.  It is in general lowest at lunch.  pt states she feels well in general.   Past Medical History:  Diagnosis Date  . Anemia   . Anxiety   . Arthritis   . Cataract   . Chronic headaches   . Depression   . Diabetes mellitus   . Diverticulosis of colon (without mention of hemorrhage) 2003, 2008   Colonoscopy  . GERD (gastroesophageal reflux disease)   . Glaucoma   . Hiatal hernia 2003   EGD   . Hyperlipidemia   . Hypertension   . Thyroid nodule     Past Surgical History:  Procedure Laterality Date  . CARPAL TUNNEL RELEASE  2006  . CARPAL TUNNEL RELEASE  12/24/2007  . CERVICAL FUSION  3/08  . CHOLECYSTECTOMY      Social History   Socioeconomic History  . Marital status: Married    Spouse name: Not on file  . Number of children: Not on file  . Years of education: Not on file  . Highest education level: Not on file  Occupational History  . Occupation: retired  Scientific laboratory technician  . Financial resource strain: Not on file  . Food insecurity:    Worry: Not on file    Inability: Not on file  . Transportation needs:    Medical: Not on file    Non-medical: Not on file  Tobacco Use  . Smoking status: Former Smoker    Packs/day: 1.40    Years: 49.00    Pack years: 68.60    Types: Cigarettes    Last attempt to quit: 07/16/2015    Years since quitting: 2.3  . Smokeless tobacco: Never Used  . Tobacco comment: e cig  Substance and Sexual Activity  . Alcohol use: Yes    Alcohol/week: 0.0 oz    Comment: once weekly; wine and beer    . Drug use: No  . Sexual activity: Yes    Partners: Male  Lifestyle  . Physical activity:    Days per week: Not on file    Minutes per session: Not on file  . Stress: Not on file  Relationships  . Social connections:    Talks on phone: Not on file    Gets together: Not on file    Attends religious service: Not on file    Active member of club or organization: Not on file    Attends meetings of clubs or organizations: Not on file    Relationship status: Not on file  . Intimate partner violence:    Fear of current or ex partner: Not on file    Emotionally abused: Not on file    Physically abused: Not on file    Forced sexual activity: Not on file  Other Topics Concern  . Not on file  Social History Narrative   Exercise--  walk    Current Outpatient Medications on File Prior to Visit  Medication Sig Dispense Refill  . amitriptyline (ELAVIL) 25 MG tablet TAKE 2 TABLETS BY MOUTH ONCE DAILY 60 tablet 5  .  atorvastatin (LIPITOR) 80 MG tablet TAKE 1 TABLET BY MOUTH ONCE DAILY 90 tablet 1  . BIOTIN PO Take 1 tablet by mouth daily.     . bromocriptine (PARLODEL) 2.5 MG tablet 1/4 tab daily 8 tablet 11  . buPROPion (WELLBUTRIN XL) 150 MG 24 hr tablet TAKE TWO TABLETS BY MOUTH ONCE DAILY IN THE MORNING 180 tablet 3  . diazepam (VALIUM) 5 MG tablet TAKE 1 TABLET BY MOUTH AT BEDTIME AS NEEDED FOR ANXIETY 30 tablet 2  . Ferrous Sulfate (IRON SUPPLEMENT PO) Take 1 tablet by mouth daily.    . hydrochlorothiazide (HYDRODIURIL) 25 MG tablet TAKE 1 TABLET BY MOUTH ONCE DAILY 30 tablet 8  . latanoprost (XALATAN) 0.005 % ophthalmic solution Place 1 drop into both eyes at bedtime.     . metFORMIN (GLUCOPHAGE-XR) 500 MG 24 hr tablet Take 4 tablets (2000 mg) daily with breakfast. 120 tablet 11  . Naproxen Sodium (ALEVE PO) Take 2 tablets by mouth daily.    Marland Kitchen omeprazole (PRILOSEC) 40 MG capsule TAKE ONE CAPSULE BY MOUTH ONCE DAILY 90 capsule 3  . repaglinide (PRANDIN) 2 MG tablet 1 tab with  breakfast, 1/2 with lunch, and 1 with supper 75 tablet 11   No current facility-administered medications on file prior to visit.     Allergies  Allergen Reactions  . Codeine Nausea Only    Severe nausea    Family History  Problem Relation Age of Onset  . Dementia Mother   . Breast cancer Mother 60  . Kidney failure Mother 77       died from kidney failure from abx given to her for pneumonia  . Hyperlipidemia Mother   . Hypertension Mother   . Diabetes Mother   . Cancer Mother 64       breast  . Parkinsonism Father   . Diabetes Father 66  . Hypertension Sister   . Diabetes Brother   . Hypertension Brother   . Heart disease Cousin 82       mi  . Colon cancer Neg Hx     BP 122/62   Pulse 83   Wt 155 lb 9.6 oz (70.6 kg)   SpO2 96%   BMI 25.11 kg/m    Review of Systems She has lost a few lbs.  She denies hypoglycemia.      Objective:   Physical Exam VITAL SIGNS:  See vs page GENERAL: no distress Pulses: dorsalis pedis intact bilat.   MSK: no deformity of the feet CV: no leg edema Skin:  no ulcer on the feet.  normal color and temp on the feet. Neuro: sensation is intact to touch on the feet  Lab Results  Component Value Date   HGBA1C 6.4 11/02/2017       Assessment & Plan:  Type 2 DM, with polyneuropathy: well-controlled. Weight loss: pt is advised to continue her efforts.   Patient Instructions  check your blood sugar once a day.  vary the time of day when you check, between before the 3 meals, and at bedtime.  also check if you have symptoms of your blood sugar being too high or too low.  please keep a record of the readings and bring it to your next appointment here (or you can bring the meter itself).  You can write it on any piece of paper.  please call us sooner if your blood sugar goes below 70, or if you have a lot of readings over 200.   Please continue  the same diabetes medications.   Please come back for a follow-up appointment in 4 months.

## 2017-11-02 NOTE — Patient Instructions (Addendum)

## 2017-11-08 ENCOUNTER — Encounter: Payer: Self-pay | Admitting: Gastroenterology

## 2017-11-13 ENCOUNTER — Encounter: Payer: Medicare Other | Admitting: Gastroenterology

## 2017-11-20 ENCOUNTER — Ambulatory Visit: Payer: Medicare Other

## 2017-11-21 ENCOUNTER — Ambulatory Visit (AMBULATORY_SURGERY_CENTER): Payer: Medicare Other | Admitting: Gastroenterology

## 2017-11-21 ENCOUNTER — Other Ambulatory Visit: Payer: Self-pay

## 2017-11-21 ENCOUNTER — Encounter: Payer: Self-pay | Admitting: Gastroenterology

## 2017-11-21 VITALS — BP 129/72 | HR 70 | Temp 97.1°F | Resp 12 | Ht 66.0 in | Wt 158.0 lb

## 2017-11-21 DIAGNOSIS — Z86010 Personal history of colonic polyps: Secondary | ICD-10-CM | POA: Diagnosis present

## 2017-11-21 DIAGNOSIS — D125 Benign neoplasm of sigmoid colon: Secondary | ICD-10-CM | POA: Diagnosis not present

## 2017-11-21 DIAGNOSIS — D124 Benign neoplasm of descending colon: Secondary | ICD-10-CM

## 2017-11-21 DIAGNOSIS — Z1211 Encounter for screening for malignant neoplasm of colon: Secondary | ICD-10-CM | POA: Diagnosis not present

## 2017-11-21 MED ORDER — SODIUM CHLORIDE 0.9 % IV SOLN: 500.0000 mL | Freq: Once | INTRAVENOUS | Status: DC

## 2017-11-21 NOTE — Progress Notes (Signed)
Called to room to assist during endoscopic procedure.  Patient ID and intended procedure confirmed with present staff. Received instructions for my participation in the procedure from the performing physician.  

## 2017-11-21 NOTE — Op Note (Signed)
[facility] Patient Name: Alyssa Ford Procedure Date: 11/21/2017 8:39 AM MRN: [id number] Endoscopist: Mauri Pole , MD Age: 67 Referring MD:  Date of Birth: 1951/01/21 Gender: Female Account #: [id number] Procedure:                Colonoscopy Indications:              High risk colon cancer surveillance: Personal                            history of colonic polyps Medicines:                Monitored Anesthesia Care Procedure:                Pre-Anesthesia Assessment:                           - Prior to the procedure, a History and Physical                            was performed, and patient medications and                            allergies were reviewed. The patient's tolerance of                            previous anesthesia was also reviewed. The risks                            and benefits of the procedure and the sedation                            options and risks were discussed with the patient.                            All questions were answered, and informed consent                            was obtained. Prior Anticoagulants: The patient has                            taken no previous anticoagulant or antiplatelet                            agents. ASA Grade Assessment: II - A patient with                            mild systemic disease. After reviewing the risks                            and benefits, the patient was deemed in                            satisfactory condition to undergo the procedure.  After obtaining informed consent, the colonoscope                            was passed under direct vision. Throughout the                            procedure, the patient's blood pressure, pulse, and                            oxygen saturations were monitored continuously. The                            Colonoscope was introduced through the anus and                            advanced to the the cecum,  identified by                            appendiceal orifice and ileocecal valve. The                            colonoscopy was technically difficult and complex                            due to a redundant colon, significant looping, a                            tortuous colon and the patient's body habitus.                            Successful completion of the procedure was aided by                            applying abdominal pressure. The patient tolerated                            the procedure well. The quality of the bowel                            preparation was good. The ileocecal valve,                            appendiceal orifice, and rectum were photographed. Scope In: 8:48:28 AM Scope Out: 9:44:08 AM Scope Withdrawal Time: 0 hours 11 minutes 26 seconds  Total Procedure Duration: 0 hours 55 minutes 40 seconds  Findings:                 The perianal and digital rectal examinations were                            normal.                           A 11 mm polyp was found in the sigmoid colon. The  polyp was semi-pedunculated. The polyp was removed                            with a hot snare. Resection and retrieval were                            complete.                           A few small-mouthed diverticula were found in the                            sigmoid colon and descending colon.                           Non-bleeding internal hemorrhoids were found during                            retroflexion. The hemorrhoids were small. Complications:            No immediate complications. Estimated Blood Loss:     Estimated blood loss was minimal. Impression:               - One 11 mm polyp in the sigmoid colon, removed                            with a hot snare. Resected and retrieved.                           - Diverticulosis in the sigmoid colon and in the                            descending colon.                           -  Non-bleeding internal hemorrhoids. Recommendation:           - Patient has a contact number available for                            emergencies. The signs and symptoms of potential                            delayed complications were discussed with the                            patient. Return to normal activities tomorrow.                            Written discharge instructions were provided to the                            patient.                           - Resume previous diet.                           -  Continue present medications.                           - Await pathology results.                           - Repeat colonoscopy in 3 - 5 years for                            surveillance based on pathology results.                           - For future colonoscopy the patient will require                            an extended preparation. If there are any                            questions, please contact the gastroenterologist. Mauri Pole, MD 11/21/2017 9:51:33 AM This report has been signed electronically.

## 2017-11-21 NOTE — Patient Instructions (Signed)
**   Handouts given on polyps, hemorrhoids, and diverticulosis **   YOU HAD AN ENDOSCOPIC PROCEDURE TODAY AT [facility]:   Refer to the procedure report that was given to you for any specific questions about what was found during the examination.  If the procedure report does not answer your questions, please call your gastroenterologist to clarify.  If you requested that your care partner not be given the details of your procedure findings, then the procedure report has been included in a sealed envelope for you to review at your convenience later.  YOU SHOULD EXPECT: Some feelings of bloating in the abdomen. Passage of more gas than usual.  Walking can help get rid of the air that was put into your GI tract during the procedure and reduce the bloating. If you had a lower endoscopy (such as a colonoscopy or flexible sigmoidoscopy) you may notice spotting of blood in your stool or on the toilet paper. If you underwent a bowel prep for your procedure, you may not have a normal bowel movement for a few days.  Please Note:  You might notice some irritation and congestion in your nose or some drainage.  This is from the oxygen used during your procedure.  There is no need for concern and it should clear up in a day or so.  SYMPTOMS TO REPORT IMMEDIATELY:   Following lower endoscopy (colonoscopy or flexible sigmoidoscopy):  Excessive amounts of blood in the stool  Significant tenderness or worsening of abdominal pains  Swelling of the abdomen that is new, acute  Fever of 100F or higher  For urgent or emergent issues, a gastroenterologist can be reached at any hour by calling [phone].   DIET:  We do recommend a small meal at first, but then you may proceed to your regular diet.  Drink plenty of fluids but you should avoid alcoholic beverages for 24 hours.  ACTIVITY:  You should plan to take it easy for the rest of today and you should NOT DRIVE or use heavy machinery until  tomorrow (because of the sedation medicines used during the test).    FOLLOW UP: Our staff will call the number listed on your records the next business day following your procedure to check on you and address any questions or concerns that you may have regarding the information given to you following your procedure. If we do not reach you, we will leave a message.  However, if you are feeling well and you are not experiencing any problems, there is no need to return our call.  We will assume that you have returned to your regular daily activities without incident.  If any biopsies were taken you will be contacted by phone or by letter within the next 1-3 weeks.  Please call us at [phone] if you have not heard about the biopsies in 3 weeks.    SIGNATURES/CONFIDENTIALITY: You and/or your care partner have signed paperwork which will be entered into your electronic medical record.  These signatures attest to the fact that that the information above on your After Visit Summary has been reviewed and is understood.  Full responsibility of the confidentiality of this discharge information lies with you and/or your care-partner.

## 2017-11-21 NOTE — Progress Notes (Signed)
Report given to PACU, vss 

## 2017-11-22 ENCOUNTER — Telehealth: Payer: Self-pay

## 2017-11-22 NOTE — Telephone Encounter (Signed)
  Follow up Call-  Call back number 11/21/2017 09/05/2016  Post procedure Call Back phone  # [phone]  Permission to leave phone message Yes Yes  Some recent data might be hidden     Patient questions:  Do you have a fever, pain , or abdominal swelling? No. Pain Score  0 *  Have you tolerated food without any problems? Yes.    Have you been able to return to your normal activities? Yes.    Do you have any questions about your discharge instructions: Diet   No. Medications  No. Follow up visit  No.  Do you have questions or concerns about your Care? No.  Actions: * If pain score is 4 or above: No action needed, pain <4.

## 2017-11-28 ENCOUNTER — Encounter: Payer: Self-pay | Admitting: Gastroenterology

## 2017-12-11 ENCOUNTER — Ambulatory Visit (INDEPENDENT_AMBULATORY_CARE_PROVIDER_SITE_OTHER): Payer: Medicare Other

## 2017-12-11 DIAGNOSIS — E538 Deficiency of other specified B group vitamins: Secondary | ICD-10-CM | POA: Diagnosis not present

## 2017-12-11 MED ORDER — CYANOCOBALAMIN 1000 MCG/ML IJ SOLN
1000.0000 ug | Freq: Once | INTRAMUSCULAR | Status: AC
  Administered 2017-12-11: 1000 ug via INTRAMUSCULAR

## 2017-12-11 NOTE — Progress Notes (Signed)
Reviewed  Yvonne R Lowne Chase, DO  

## 2017-12-11 NOTE — Progress Notes (Signed)
Pre visit review using our clinic tool,if applicable. No additional management support is needed unless otherwise documented below in the visit note.   Pt here for monthly B12 injection per order from Dr. Carollee Herter.  B12 1059mcg given IM left deltoid, and pt tolerated injection well.  No complaints this visit.  Next B12 injection scheduled for January 10, 2018. Patient aware.

## 2017-12-17 ENCOUNTER — Ambulatory Visit: Payer: Medicare Other | Admitting: *Deleted

## 2017-12-17 DIAGNOSIS — E119 Type 2 diabetes mellitus without complications: Secondary | ICD-10-CM | POA: Diagnosis not present

## 2017-12-17 DIAGNOSIS — H401131 Primary open-angle glaucoma, bilateral, mild stage: Secondary | ICD-10-CM | POA: Diagnosis not present

## 2017-12-17 DIAGNOSIS — Z961 Presence of intraocular lens: Secondary | ICD-10-CM | POA: Diagnosis not present

## 2017-12-30 ENCOUNTER — Other Ambulatory Visit: Payer: Self-pay | Admitting: Family Medicine

## 2017-12-30 DIAGNOSIS — K219 Gastro-esophageal reflux disease without esophagitis: Secondary | ICD-10-CM

## 2018-01-10 ENCOUNTER — Ambulatory Visit (INDEPENDENT_AMBULATORY_CARE_PROVIDER_SITE_OTHER): Payer: Medicare Other

## 2018-01-10 DIAGNOSIS — E538 Deficiency of other specified B group vitamins: Secondary | ICD-10-CM | POA: Diagnosis not present

## 2018-01-10 MED ORDER — CYANOCOBALAMIN 1000 MCG/ML IJ SOLN
1000.0000 ug | Freq: Once | INTRAMUSCULAR | Status: AC
  Administered 2018-01-10: 1000 ug via INTRAMUSCULAR

## 2018-01-10 NOTE — Progress Notes (Signed)
Reviewed  Raneen Jaffer R Lowne Chase, DO  

## 2018-01-10 NOTE — Progress Notes (Signed)
Pre visit review using our clinic tool,if applicable. No additional management support is needed unless otherwise documented below in the visit note.  Pt here for monthly B12 injection per Roma Schanz DO  B12 1066mcg given IM right deltoid,  patient tolerated injection well.  No complaints voiced this visit.  Next B12 injection scheduled for August 16,2019. Patient aware.

## 2018-01-23 ENCOUNTER — Telehealth: Payer: Self-pay | Admitting: Family Medicine

## 2018-01-23 NOTE — Telephone Encounter (Signed)
Copied from [place] [phone]. Topic: Quick Communication - See Telephone Encounter >> Jan 23, 2018 10:32 AM Genella Rife H wrote: CRM for notification. See Telephone encounter for: 01/23/18.  Left voicemail appt needs to be rescheduled per pcp

## 2018-01-28 ENCOUNTER — Other Ambulatory Visit: Payer: Self-pay | Admitting: Family Medicine

## 2018-02-05 ENCOUNTER — Encounter: Payer: Medicare Other | Admitting: Family Medicine

## 2018-02-08 ENCOUNTER — Ambulatory Visit (INDEPENDENT_AMBULATORY_CARE_PROVIDER_SITE_OTHER): Payer: Medicare Other

## 2018-02-08 DIAGNOSIS — E538 Deficiency of other specified B group vitamins: Secondary | ICD-10-CM

## 2018-02-08 MED ORDER — CYANOCOBALAMIN 1000 MCG/ML IJ SOLN
1000.0000 ug | Freq: Once | INTRAMUSCULAR | Status: AC
  Administered 2018-02-08: 1000 ug via INTRAMUSCULAR

## 2018-02-08 NOTE — Progress Notes (Signed)
Reviewed  Yvonne R Lowne Chase, DO  

## 2018-02-08 NOTE — Progress Notes (Signed)
Pre visit review using our clinic tool,if applicable. No additional management support is needed unless otherwise documented below in the visit note.   Pt here for monthly B12 injection per order from Dr. Carollee Herter.  B12 107mcg given IM left deltoid, and pt tolerated injection well.  No complaints voiced this visit.  Next B12 injection scheduled for March 12, 2018. Patient aware.

## 2018-03-04 ENCOUNTER — Ambulatory Visit: Payer: Medicare Other | Admitting: Endocrinology

## 2018-03-04 ENCOUNTER — Encounter: Payer: Self-pay | Admitting: Endocrinology

## 2018-03-04 VITALS — BP 128/80 | HR 78 | Ht 66.0 in | Wt 157.0 lb

## 2018-03-04 DIAGNOSIS — IMO0002 Reserved for concepts with insufficient information to code with codable children: Secondary | ICD-10-CM

## 2018-03-04 DIAGNOSIS — E1165 Type 2 diabetes mellitus with hyperglycemia: Secondary | ICD-10-CM

## 2018-03-04 DIAGNOSIS — E1151 Type 2 diabetes mellitus with diabetic peripheral angiopathy without gangrene: Secondary | ICD-10-CM

## 2018-03-04 LAB — POCT GLYCOSYLATED HEMOGLOBIN (HGB A1C): Hemoglobin A1C: 6.7 % — AB (ref 4.0–5.6)

## 2018-03-04 MED ORDER — BROMOCRIPTINE MESYLATE 2.5 MG PO TABS: 1.2500 mg | ORAL_TABLET | Freq: Every day | ORAL | 3 refills | Status: DC

## 2018-03-04 NOTE — Progress Notes (Signed)
Subjective:    Patient ID: Alyssa Ford, female    DOB: 10-Jul-1950, 67 y.o.   MRN: [id number]  HPI Pt returns for f/u of diabetes mellitus: DM type: 2 Dx'ed: 1937 Complications: polyneuropathy.  Therapy: 3 oral meds GDM: never DKA: never Severe hypoglycemia: never Pancreatitis: never Pancreatic imaging: never Other: she has never been on insulin; she wants generic meds, if possible.   Interval history: Pt states cbg's vary from 110-160.  It is in general lowest at lunch.  pt states she feels well in general.   Past Medical History:  Diagnosis Date  . Anemia   . Anxiety   . Arthritis   . Cataract   . Chronic headaches   . Depression   . Diabetes mellitus   . Diverticulosis of colon (without mention of hemorrhage) 2003, 2008   Colonoscopy  . GERD (gastroesophageal reflux disease)   . Glaucoma   . Hiatal hernia 2003   EGD   . Hyperlipidemia   . Hypertension   . Thyroid nodule     Past Surgical History:  Procedure Laterality Date  . CARPAL TUNNEL RELEASE  2006  . CARPAL TUNNEL RELEASE  12/24/2007  . CERVICAL FUSION  3/08  . CHOLECYSTECTOMY      Social History   Socioeconomic History  . Marital status: Married    Spouse name: Not on file  . Number of children: Not on file  . Years of education: Not on file  . Highest education level: Not on file  Occupational History  . Occupation: retired  Scientific laboratory technician  . Financial resource strain: Not on file  . Food insecurity:    Worry: Not on file    Inability: Not on file  . Transportation needs:    Medical: Not on file    Non-medical: Not on file  Tobacco Use  . Smoking status: Former Smoker    Packs/day: 1.40    Years: 49.00    Pack years: 68.60    Types: Cigarettes    Last attempt to quit: 07/16/2015    Years since quitting: 2.6  . Smokeless tobacco: Never Used  . Tobacco comment: e cig  Substance and Sexual Activity  . Alcohol use: Yes    Alcohol/week: 0.0 standard drinks    Comment: once weekly;  wine and beer  . Drug use: No  . Sexual activity: Yes    Partners: Male  Lifestyle  . Physical activity:    Days per week: Not on file    Minutes per session: Not on file  . Stress: Not on file  Relationships  . Social connections:    Talks on phone: Not on file    Gets together: Not on file    Attends religious service: Not on file    Active member of club or organization: Not on file    Attends meetings of clubs or organizations: Not on file    Relationship status: Not on file  . Intimate partner violence:    Fear of current or ex partner: Not on file    Emotionally abused: Not on file    Physically abused: Not on file    Forced sexual activity: Not on file  Other Topics Concern  . Not on file  Social History Narrative   Exercise--  walk    Current Outpatient Medications on File Prior to Visit  Medication Sig Dispense Refill  . amitriptyline (ELAVIL) 25 MG tablet TAKE 2 TABLETS BY MOUTH ONCE DAILY 60 tablet 5  .  aspirin EC 81 MG tablet Take 81 mg by mouth daily.    Marland Kitchen atorvastatin (LIPITOR) 80 MG tablet TAKE 1 TABLET BY MOUTH ONCE DAILY 90 tablet 1  . BIOTIN PO Take 1 tablet by mouth daily.     Marland Kitchen buPROPion (WELLBUTRIN XL) 150 MG 24 hr tablet TAKE TWO TABLETS BY MOUTH ONCE DAILY IN THE MORNING 180 tablet 3  . diazepam (VALIUM) 5 MG tablet TAKE 1 TABLET BY MOUTH AT BEDTIME AS NEEDED FOR ANXIETY 30 tablet 2  . Ferrous Sulfate (IRON SUPPLEMENT PO) Take 1 tablet by mouth daily.    . hydrochlorothiazide (HYDRODIURIL) 25 MG tablet TAKE 1 TABLET BY MOUTH ONCE DAILY 30 tablet 8  . latanoprost (XALATAN) 0.005 % ophthalmic solution Place 1 drop into both eyes at bedtime.     . metFORMIN (GLUCOPHAGE-XR) 500 MG 24 hr tablet Take 4 tablets (2000 mg) daily with breakfast. 120 tablet 11  . Naproxen Sodium (ALEVE PO) Take 2 tablets by mouth daily.    Marland Kitchen omeprazole (PRILOSEC) 40 MG capsule TAKE 1 CAPSULE BY MOUTH ONCE DAILY 90 capsule 3  . repaglinide (PRANDIN) 2 MG tablet 1 tab with  breakfast, 1/2 with lunch, and 1 with supper 75 tablet 11   Current Facility-Administered Medications on File Prior to Visit  Medication Dose Route Frequency Provider Last Rate Last Dose  . 0.9 %  sodium chloride infusion  500 mL Intravenous Once Nandigam, Venia Minks, MD        Allergies  Allergen Reactions  . Codeine Nausea Only    Severe nausea    Family History  Problem Relation Age of Onset  . Dementia Mother   . Breast cancer Mother 30  . Kidney failure Mother 38       died from kidney failure from abx given to her for pneumonia  . Hyperlipidemia Mother   . Hypertension Mother   . Diabetes Mother   . Cancer Mother 34       breast  . Parkinsonism Father   . Diabetes Father 55  . Hypertension Sister   . Diabetes Brother   . Hypertension Brother   . Heart disease Cousin 24       mi  . Colon cancer Neg Hx     BP 128/80 (BP Location: Right Arm)   Pulse 78   Ht 5\' 6"  (1.676 m)   Wt 157 lb (71.2 kg)   SpO2 97%   BMI 25.34 kg/m    Review of Systems She denies hypoglycemia    Objective:   Physical Exam VITAL SIGNS:  See vs page GENERAL: no distress Pulses: foot pulses are intact bilaterally.   MSK: no deformity of the feet or ankles.  CV: no edema of the legs or ankles Skin:  no ulcer on the feet or ankles.  normal color and temp on the feet and ankles Neuro: sensation is intact to touch on the feet and ankles.    Lab Results  Component Value Date   HGBA1C 6.7 (A) 03/04/2018       Assessment & Plan:  Type 2 DM, with polyneuropathy: worse   Patient Instructions  I have sent a prescription to your pharmacy, to double the bromocriptine. Please continue the same other diabetes medications. check your blood sugar once a day.  vary the time of day when you check, between before the 3 meals, and at bedtime.  also check if you have symptoms of your blood sugar being too high or too low.  please keep a record of the readings and bring it to your next appointment  here (or you can bring the meter itself).  You can write it on any piece of paper.  please call us sooner if your blood sugar goes below 70, or if you have a lot of readings over 200. Please come back for a follow-up appointment in 4 months.

## 2018-03-04 NOTE — Patient Instructions (Signed)
I have sent a prescription to your pharmacy, to double the bromocriptine. Please continue the same other diabetes medications. check your blood sugar once a day.  vary the time of day when you check, between before the 3 meals, and at bedtime.  also check if you have symptoms of your blood sugar being too high or too low.  please keep a record of the readings and bring it to your next appointment here (or you can bring the meter itself).  You can write it on any piece of paper.  please call us sooner if your blood sugar goes below 70, or if you have a lot of readings over 200. Please come back for a follow-up appointment in 4 months.

## 2018-03-06 DIAGNOSIS — H90A11 Conductive hearing loss, unilateral, right ear with restricted hearing on the contralateral side: Secondary | ICD-10-CM | POA: Diagnosis not present

## 2018-03-06 DIAGNOSIS — J029 Acute pharyngitis, unspecified: Secondary | ICD-10-CM | POA: Diagnosis not present

## 2018-03-06 DIAGNOSIS — R490 Dysphonia: Secondary | ICD-10-CM | POA: Diagnosis not present

## 2018-03-09 ENCOUNTER — Other Ambulatory Visit: Payer: Self-pay | Admitting: Family Medicine

## 2018-03-09 DIAGNOSIS — F411 Generalized anxiety disorder: Secondary | ICD-10-CM

## 2018-03-09 DIAGNOSIS — Z79899 Other long term (current) drug therapy: Secondary | ICD-10-CM

## 2018-03-12 ENCOUNTER — Ambulatory Visit (INDEPENDENT_AMBULATORY_CARE_PROVIDER_SITE_OTHER): Payer: Medicare Other

## 2018-03-12 DIAGNOSIS — E538 Deficiency of other specified B group vitamins: Secondary | ICD-10-CM | POA: Diagnosis not present

## 2018-03-12 MED ORDER — CYANOCOBALAMIN 1000 MCG/ML IJ SOLN
1000.0000 ug | Freq: Once | INTRAMUSCULAR | Status: AC
  Administered 2018-03-12: 1000 ug via INTRAMUSCULAR

## 2018-03-12 NOTE — Progress Notes (Signed)
Pre visit review using our clinic tool,if applicable. No additional management support is needed unless otherwise documented below in the visit note.   Pt here for monthly B12 injection per order from Dr. Roma Schanz. B12 1013mcg given IM right deltoid,  pt tolerated injection well. No complaints voiced this visit.  Next B12 injection scheduled for 04/08/18 patient will be seeing provider.

## 2018-03-13 NOTE — Telephone Encounter (Signed)
Ok to refill x1 but need uds , contract and database

## 2018-03-13 NOTE — Telephone Encounter (Signed)
Requesting:valium Contract:no UDS:no Last OV: Next OV:04/08/18 Last Refill:07/13/17 [id number] Database:   Please advise

## 2018-03-14 ENCOUNTER — Other Ambulatory Visit: Payer: Self-pay | Admitting: Family Medicine

## 2018-03-14 NOTE — Telephone Encounter (Signed)
Pt has an appt set up for CPE. Okay for refill? Please advise

## 2018-03-15 ENCOUNTER — Telehealth: Payer: Self-pay | Admitting: Endocrinology

## 2018-03-15 NOTE — Telephone Encounter (Signed)
Can pt be changed to something else? Or should I advise her to try another pharmacy where ir may be cheaper?

## 2018-03-15 NOTE — Telephone Encounter (Signed)
bromocriptine (PARLODEL) 2.5 MG tablet  Patient stated that this medication is expensive. She stated that she wasn't paying $97 before She is requesting a call back from the office. Please advise

## 2018-03-15 NOTE — Telephone Encounter (Signed)
For now, please d/c bromocriptine, and Please continue the same other diabetes medications.  I'll see you next time.

## 2018-03-15 NOTE — Telephone Encounter (Signed)
Notified pt and she voices understanding. Rx called to pharmacy voicemail. Contract printed and left at front desk for completion at lab visit on 03/21/18. Future lab order entered.

## 2018-03-19 NOTE — Telephone Encounter (Signed)
lft vm for pt to return call to discuss instructions

## 2018-03-21 ENCOUNTER — Other Ambulatory Visit: Payer: Medicare Other

## 2018-03-22 ENCOUNTER — Other Ambulatory Visit: Payer: Medicare Other

## 2018-03-22 ENCOUNTER — Telehealth: Payer: Self-pay | Admitting: Endocrinology

## 2018-03-22 ENCOUNTER — Other Ambulatory Visit: Payer: Self-pay

## 2018-03-22 DIAGNOSIS — Z79899 Other long term (current) drug therapy: Secondary | ICD-10-CM | POA: Diagnosis not present

## 2018-03-22 DIAGNOSIS — F411 Generalized anxiety disorder: Secondary | ICD-10-CM

## 2018-03-22 MED ORDER — BROMOCRIPTINE MESYLATE 2.5 MG PO TABS: 1.2500 mg | ORAL_TABLET | Freq: Every day | ORAL | 6 refills | Status: DC

## 2018-03-22 NOTE — Telephone Encounter (Signed)
Pt stated that her medication bromocriptine was more expensive than normal. She wanted to know what had been changed.Please advise

## 2018-03-22 NOTE — Progress Notes (Signed)
Sent new order in for 30d worth with refills as this may lower the cost to where she can afford it/thx dmf

## 2018-03-22 NOTE — Telephone Encounter (Signed)
OK, please reduce the bromocriptine back to 1/4 pill per day.  I'll see you next time.

## 2018-03-22 NOTE — Telephone Encounter (Signed)
Pt. Is returning all about instructions on medication. Ph # [phone] Please advice.

## 2018-03-24 LAB — PAIN MGMT, PROFILE 8 W/CONF, U
6 Acetylmorphine: NEGATIVE ng/mL (ref <10)
Alcohol Metabolites: NEGATIVE ng/mL (ref <500)
Alphahydroxyalprazolam: NEGATIVE ng/mL (ref <25)
Alphahydroxymidazolam: NEGATIVE ng/mL (ref <50)
Alphahydroxytriazolam: NEGATIVE ng/mL (ref <50)
Aminoclonazepam: NEGATIVE ng/mL (ref <25)
Amphetamines: NEGATIVE ng/mL (ref <500)
Benzodiazepines: POSITIVE ng/mL — AB (ref <100)
Buprenorphine, Urine: NEGATIVE ng/mL (ref <5)
Cocaine Metabolite: NEGATIVE ng/mL (ref <150)
Creatinine: 25 mg/dL
Hydroxyethylflurazepam: NEGATIVE ng/mL (ref <50)
Lorazepam: NEGATIVE ng/mL (ref <50)
MDMA: NEGATIVE ng/mL (ref <500)
Marijuana Metabolite: NEGATIVE ng/mL (ref <20)
Nordiazepam: NEGATIVE ng/mL (ref <50)
Opiates: NEGATIVE ng/mL (ref <100)
Oxazepam: NEGATIVE ng/mL (ref <50)
Oxidant: NEGATIVE ug/mL (ref <200)
Oxycodone: NEGATIVE ng/mL (ref <100)
Temazepam: 50 ng/mL — ABNORMAL HIGH (ref <50)
pH: 7.03 (ref 4.5–9.0)

## 2018-04-08 ENCOUNTER — Encounter: Payer: Self-pay | Admitting: Family Medicine

## 2018-04-08 ENCOUNTER — Ambulatory Visit (INDEPENDENT_AMBULATORY_CARE_PROVIDER_SITE_OTHER): Payer: Medicare Other | Admitting: Family Medicine

## 2018-04-08 VITALS — BP 126/62 | HR 83 | Temp 98.0°F | Resp 16 | Ht 66.0 in | Wt 157.2 lb

## 2018-04-08 DIAGNOSIS — Z23 Encounter for immunization: Secondary | ICD-10-CM

## 2018-04-08 DIAGNOSIS — E785 Hyperlipidemia, unspecified: Secondary | ICD-10-CM | POA: Diagnosis not present

## 2018-04-08 DIAGNOSIS — E538 Deficiency of other specified B group vitamins: Secondary | ICD-10-CM

## 2018-04-08 DIAGNOSIS — Z0000 Encounter for general adult medical examination without abnormal findings: Secondary | ICD-10-CM | POA: Diagnosis not present

## 2018-04-08 DIAGNOSIS — E119 Type 2 diabetes mellitus without complications: Secondary | ICD-10-CM | POA: Diagnosis not present

## 2018-04-08 DIAGNOSIS — I10 Essential (primary) hypertension: Secondary | ICD-10-CM

## 2018-04-08 MED ORDER — CYANOCOBALAMIN 1000 MCG/ML IJ SOLN
1000.0000 ug | Freq: Once | INTRAMUSCULAR | Status: AC
  Administered 2018-04-08: 1000 ug via INTRAMUSCULAR

## 2018-04-08 NOTE — Patient Instructions (Signed)
Preventive Care 65 Years and Older, Female Preventive care refers to lifestyle choices and visits with your health care provider that can promote health and wellness. What does preventive care include?  A yearly physical exam. This is also called an annual well check.  Dental exams once or twice a year.  Routine eye exams. Ask your health care provider how often you should have your eyes checked.  Personal lifestyle choices, including: ? Daily care of your teeth and gums. ? Regular physical activity. ? Eating a healthy diet. ? Avoiding tobacco and drug use. ? Limiting alcohol use. ? Practicing safe sex. ? Taking low-dose aspirin every day. ? Taking vitamin and mineral supplements as recommended by your health care provider. What happens during an annual well check? The services and screenings done by your health care provider during your annual well check will depend on your age, overall health, lifestyle risk factors, and family history of disease. Counseling Your health care provider may ask you questions about your:  Alcohol use.  Tobacco use.  Drug use.  Emotional well-being.  Home and relationship well-being.  Sexual activity.  Eating habits.  History of falls.  Memory and ability to understand (cognition).  Work and work environment.  Reproductive health.  Screening You may have the following tests or measurements:  Height, weight, and BMI.  Blood pressure.  Lipid and cholesterol levels. These may be checked every 5 years, or more frequently if you are over 50 years old.  Skin check.  Lung cancer screening. You may have this screening every year starting at age 55 if you have a 30-pack-year history of smoking and currently smoke or have quit within the past 15 years.  Fecal occult blood test (FOBT) of the stool. You may have this test every year starting at age 50.  Flexible sigmoidoscopy or colonoscopy. You may have a sigmoidoscopy every 5 years or  a colonoscopy every 10 years starting at age 50.  Hepatitis C blood test.  Hepatitis B blood test.  Sexually transmitted disease (STD) testing.  Diabetes screening. This is done by checking your blood sugar (glucose) after you have not eaten for a while (fasting). You may have this done every 1-3 years.  Bone density scan. This is done to screen for osteoporosis. You may have this done starting at age 65.  Mammogram. This may be done every 1-2 years. Talk to your health care provider about how often you should have regular mammograms.  Talk with your health care provider about your test results, treatment options, and if necessary, the need for more tests. Vaccines Your health care provider may recommend certain vaccines, such as:  Influenza vaccine. This is recommended every year.  Tetanus, diphtheria, and acellular pertussis (Tdap, Td) vaccine. You may need a Td booster every 10 years.  Varicella vaccine. You may need this if you have not been vaccinated.  Zoster vaccine. You may need this after age 60.  Measles, mumps, and rubella (MMR) vaccine. You may need at least one dose of MMR if you were born in 1957 or later. You may also need a second dose.  Pneumococcal 13-valent conjugate (PCV13) vaccine. One dose is recommended after age 65.  Pneumococcal polysaccharide (PPSV23) vaccine. One dose is recommended after age 65.  Meningococcal vaccine. You may need this if you have certain conditions.  Hepatitis A vaccine. You may need this if you have certain conditions or if you travel or work in places where you may be exposed to hepatitis   A.  Hepatitis B vaccine. You may need this if you have certain conditions or if you travel or work in places where you may be exposed to hepatitis B.  Haemophilus influenzae type b (Hib) vaccine. You may need this if you have certain conditions.  Talk to your health care provider about which screenings and vaccines you need and how often you  need them. This information is not intended to replace advice given to you by your health care provider. Make sure you discuss any questions you have with your health care provider. Document Released: 07/09/2015 Document Revised: 03/01/2016 Document Reviewed: 04/13/2015 Elsevier Interactive Patient Education  2018 [employer] Inc[employer]  

## 2018-04-08 NOTE — Progress Notes (Signed)
Subjective:     Alyssa Ford is a 67 y.o. female and is here for a comprehensive physical exam. The patient reports no problems.  Social History   Socioeconomic History  . Marital status: Married    Spouse name: Not on file  . Number of children: Not on file  . Years of education: Not on file  . Highest education level: Not on file  Occupational History  . Occupation: retired  Scientific laboratory technician  . Financial resource strain: Not on file  . Food insecurity:    Worry: Not on file    Inability: Not on file  . Transportation needs:    Medical: Not on file    Non-medical: Not on file  Tobacco Use  . Smoking status: Former Smoker    Packs/day: 1.40    Years: 49.00    Pack years: 68.60    Types: Cigarettes    Last attempt to quit: 07/16/2015    Years since quitting: 2.7  . Smokeless tobacco: Never Used  . Tobacco comment: e cig  Substance and Sexual Activity  . Alcohol use: Yes    Alcohol/week: 0.0 standard drinks    Comment: once weekly; wine and beer  . Drug use: No  . Sexual activity: Yes    Partners: Male  Lifestyle  . Physical activity:    Days per week: Not on file    Minutes per session: Not on file  . Stress: Not on file  Relationships  . Social connections:    Talks on phone: Not on file    Gets together: Not on file    Attends religious service: Not on file    Active member of club or organization: Not on file    Attends meetings of clubs or organizations: Not on file    Relationship status: Not on file  . Intimate partner violence:    Fear of current or ex partner: Not on file    Emotionally abused: Not on file    Physically abused: Not on file    Forced sexual activity: Not on file  Other Topics Concern  . Not on file  Social History Narrative   Exercise--  walk   Health Maintenance  Topic Date Due  . URINE MICROALBUMIN  04/28/2017  . OPHTHALMOLOGY EXAM  01/11/2018  . INFLUENZA VACCINE  01/24/2018  . MAMMOGRAM  04/19/2018  . PNA vac Low Risk Adult  (2 of 2 - PPSV23) 05/07/2018  . HEMOGLOBIN A1C  09/02/2018  . FOOT EXAM  03/05/2019  . TETANUS/TDAP  02/24/2020  . COLONOSCOPY  11/21/2020  . DEXA SCAN  Completed  . Hepatitis C Screening  Completed    The following portions of the patient's history were reviewed and updated as appropriate:  She  has a past medical history of Anemia, Anxiety, Arthritis, Cataract, Chronic headaches, Depression, Diabetes mellitus, Diverticulosis of colon (without mention of hemorrhage) (2003, 2008), GERD (gastroesophageal reflux disease), Glaucoma, Hiatal hernia (2003), Hyperlipidemia, Hypertension, and Thyroid nodule. She does not have any pertinent problems on file. She  has a past surgical history that includes Cholecystectomy; Carpal tunnel release (2006); Carpal tunnel release (12/24/2007); and Cervical fusion (3/08). Her family history includes Breast cancer (age of onset: 48) in her mother; Cancer (age of onset: 22) in her mother; Dementia in her mother; Diabetes in her brother and mother; Diabetes (age of onset: 9) in her father; Heart disease (age of onset: 66) in her cousin; Hyperlipidemia in her mother; Hypertension in her brother, mother,  and sister; Kidney failure (age of onset: 25) in her mother; Parkinsonism in her father. She  reports that she quit smoking about 2 years ago. Her smoking use included cigarettes. She has a 68.60 pack-year smoking history. She has never used smokeless tobacco. She reports that she drinks alcohol. She reports that she does not use drugs. She has a current medication list which includes the following prescription(s): amitriptyline, aspirin ec, atorvastatin, biotin, bromocriptine, bupropion, coenzyme q10, diazepam, ferrous sulfate, hydrochlorothiazide, latanoprost, metformin, naproxen sodium, omeprazole, and repaglinide, and the following Facility-Administered Medications: sodium chloride. Current Outpatient Medications on File Prior to Visit  Medication Sig Dispense Refill   . amitriptyline (ELAVIL) 25 MG tablet TAKE 2 TABLETS BY MOUTH ONCE DAILY 60 tablet 5  . aspirin EC 81 MG tablet Take 81 mg by mouth daily.    Marland Kitchen atorvastatin (LIPITOR) 80 MG tablet TAKE 1 TABLET BY MOUTH ONCE DAILY 90 tablet 1  . BIOTIN PO Take 1 tablet by mouth daily.     . bromocriptine (PARLODEL) 2.5 MG tablet Take 0.5 tablets (1.25 mg total) by mouth daily. 15 tablet 6  . buPROPion (WELLBUTRIN XL) 150 MG 24 hr tablet TAKE TWO TABLETS BY MOUTH ONCE DAILY IN THE MORNING 180 tablet 3  . Coenzyme Q10 (CO Q 10 PO) Take 1 capsule by mouth daily.    . diazepam (VALIUM) 5 MG tablet TAKE 1 TABLET BY MOUTH AT BEDTIME AS NEEDED FOR ANXIETY 30 tablet 0  . Ferrous Sulfate (IRON SUPPLEMENT PO) Take 2 tablets by mouth daily.     . hydrochlorothiazide (HYDRODIURIL) 25 MG tablet TAKE 1 TABLET BY MOUTH ONCE DAILY 30 tablet 8  . latanoprost (XALATAN) 0.005 % ophthalmic solution Place 1 drop into both eyes at bedtime.     . metFORMIN (GLUCOPHAGE-XR) 500 MG 24 hr tablet Take 4 tablets (2000 mg) daily with breakfast. 120 tablet 11  . Naproxen Sodium (ALEVE PO) Take 2 tablets by mouth daily.    Marland Kitchen omeprazole (PRILOSEC) 40 MG capsule TAKE 1 CAPSULE BY MOUTH ONCE DAILY 90 capsule 3  . repaglinide (PRANDIN) 2 MG tablet 1 tab with breakfast, 1/2 with lunch, and 1 with supper 75 tablet 11   Current Facility-Administered Medications on File Prior to Visit  Medication Dose Route Frequency Provider Last Rate Last Dose  . 0.9 %  sodium chloride infusion  500 mL Intravenous Once Nandigam, Venia Minks, MD       She is allergic to codeine..  Review of Systems Review of Systems  Constitutional: Negative for activity change, appetite change and fatigue.  HENT: Negative for hearing loss, congestion, tinnitus and ear discharge.  dentist q76m Eyes: Negative for visual disturbance (see optho q1y -- vision corrected to 20/20 with glasses).  Respiratory: Negative for cough, chest tightness and shortness of breath.    Cardiovascular: Negative for chest pain, palpitations and leg swelling.  Gastrointestinal: Negative for abdominal pain, diarrhea, constipation and abdominal distention.  Genitourinary: Negative for urgency, frequency, decreased urine volume and difficulty urinating.  Musculoskeletal: Negative for back pain, arthralgias and gait problem.  Skin: Negative for color change, pallor and rash.  Neurological: Negative for dizziness, light-headedness, numbness and headaches.  Hematological: Negative for adenopathy. Does not bruise/bleed easily.  Psychiatric/Behavioral: Negative for suicidal ideas, confusion, sleep disturbance, self-injury, dysphoric mood, decreased concentration and agitation.      Objective:    BP 126/62 (BP Location: Right Arm, Cuff Size: Normal)   Pulse 83   Temp 98 F (36.7 C) (Oral)  Resp 16   Ht 5\' 6"  (1.676 m)   Wt 157 lb 3.2 oz (71.3 kg)   SpO2 98%   BMI 25.37 kg/m  General appearance: alert, cooperative, appears stated age and no distress Head: Normocephalic, without obvious abnormality, atraumatic Eyes: negative findings: lids and lashes normal and conjunctivae and sclerae normal Ears: normal TM's and external ear canals both ears Nose: Nares normal. Septum midline. Mucosa normal. No drainage or sinus tenderness. Throat: lips, mucosa, and tongue normal; teeth and gums normal Neck: no adenopathy, no carotid bruit, no JVD, supple, symmetrical, trachea midline and thyroid not enlarged, symmetric, no tenderness/mass/nodules Back: symmetric, no curvature. ROM normal. No CVA tenderness. Lungs: clear to auscultation bilaterally Breasts: normal appearance, no masses or tenderness Heart: regular rate and rhythm, S1, S2 normal, no murmur, click, rub or gallop Abdomen: soft, non-tender; bowel sounds normal; no masses,  no organomegaly Pelvic: not indicated; post-menopausal, no abnormal Pap smears in past Extremities: extremities normal, atraumatic, no cyanosis or  edema Pulses: 2+ and symmetric Skin: Skin color, texture, turgor normal. No rashes or lesions Lymph nodes: Cervical, supraclavicular, and axillary nodes normal. Neurologic: Alert and oriented X 3, normal strength and tone. Normal symmetric reflexes. Normal coordination and gait    Assessment:    Healthy female exam.      Plan:    ghm utd Check labs  See After Visit Summary for Counseling Recommendations    1. Type 2 diabetes mellitus without complication, without long-term current use of insulin (HCC) hgba1c acceptable, minimize simple carbs. Increase exercise as tolerated. Continue current meds - Comprehensive metabolic panel - Microalbumin / creatinine urine ratio  2. Essential hypertension Well controlled, no changes to meds. Encouraged heart healthy diet such as the DASH diet and exercise as tolerated.   3. Hyperlipidemia LDL goal <70 Tolerating statin, encouraged heart healthy diet, avoid trans fats, minimize simple carbs and saturated fats. Increase exercise as tolerated - Comprehensive metabolic panel - Lipid panel  4. B12 deficiency Check labs  - CBC with Differential/Platelet - Vitamin B12 - cyanocobalamin ((VITAMIN B-12)) injection 1,000 mcg  5. Preventative health care See above  6. Influenza vaccine administered  - Flu vaccine HIGH DOSE PF (Fluzone High Dose)hgba1c acceptable, minimize simple carbs. Increase exercise as tolerated. Continue current meds

## 2018-04-09 LAB — COMPREHENSIVE METABOLIC PANEL
ALT: 23 U/L (ref 0–35)
AST: 15 U/L (ref 0–37)
Albumin: 4.8 g/dL (ref 3.5–5.2)
Alkaline Phosphatase: 103 U/L (ref 39–117)
BUN: 14 mg/dL (ref 6–23)
CO2: 29 mEq/L (ref 19–32)
Calcium: 10.6 mg/dL — ABNORMAL HIGH (ref 8.4–10.5)
Chloride: 96 mEq/L (ref 96–112)
Creatinine, Ser: 0.9 mg/dL (ref 0.40–1.20)
GFR: 66.17 mL/min (ref >60.00)
Glucose, Bld: 103 mg/dL — ABNORMAL HIGH (ref 70–99)
Potassium: 3.6 mEq/L (ref 3.5–5.1)
Sodium: 138 mEq/L (ref 135–145)
Total Bilirubin: 0.3 mg/dL (ref 0.2–1.2)
Total Protein: 7.5 g/dL (ref 6.0–8.3)

## 2018-04-09 LAB — CBC WITH DIFFERENTIAL/PLATELET
Basophils Absolute: 0.1 10*3/uL (ref 0.0–0.1)
Basophils Relative: 1 % (ref 0.0–3.0)
Eosinophils Absolute: 0.2 10*3/uL (ref 0.0–0.7)
Eosinophils Relative: 2 % (ref 0.0–5.0)
HCT: 42.2 % (ref 36.0–46.0)
Hemoglobin: 14.2 g/dL (ref 12.0–15.0)
Lymphocytes Relative: 29.2 % (ref 12.0–46.0)
Lymphs Abs: 3.1 10*3/uL (ref 0.7–4.0)
MCHC: 33.6 g/dL (ref 30.0–36.0)
MCV: 91.3 fl (ref 78.0–100.0)
Monocytes Absolute: 0.6 10*3/uL (ref 0.1–1.0)
Monocytes Relative: 6.2 % (ref 3.0–12.0)
Neutro Abs: 6.5 10*3/uL (ref 1.4–7.7)
Neutrophils Relative %: 61.6 % (ref 43.0–77.0)
Platelets: 327 10*3/uL (ref 150.0–400.0)
RBC: 4.62 Mil/uL (ref 3.87–5.11)
RDW: 14.1 % (ref 11.5–15.5)
WBC: 10.5 10*3/uL (ref 4.0–10.5)

## 2018-04-09 LAB — LIPID PANEL
Cholesterol: 177 mg/dL (ref 0–200)
HDL: 54.3 mg/dL (ref >39.00)
NonHDL: 122.33
Total CHOL/HDL Ratio: 3
Triglycerides: 251 mg/dL — ABNORMAL HIGH (ref 0.0–149.0)
VLDL: 50.2 mg/dL — ABNORMAL HIGH (ref 0.0–40.0)

## 2018-04-09 LAB — LDL CHOLESTEROL, DIRECT: Direct LDL: 103 mg/dL

## 2018-04-09 LAB — MICROALBUMIN / CREATININE URINE RATIO
Creatinine,U: 44.9 mg/dL
Microalb Creat Ratio: 2.5 mg/g (ref 0.0–30.0)
Microalb, Ur: 1.1 mg/dL (ref 0.0–1.9)

## 2018-04-09 LAB — VITAMIN B12: Vitamin B-12: 369 pg/mL (ref 211–911)

## 2018-04-16 ENCOUNTER — Other Ambulatory Visit: Payer: Self-pay | Admitting: *Deleted

## 2018-04-16 DIAGNOSIS — I10 Essential (primary) hypertension: Secondary | ICD-10-CM

## 2018-04-16 DIAGNOSIS — E785 Hyperlipidemia, unspecified: Secondary | ICD-10-CM

## 2018-05-02 ENCOUNTER — Ambulatory Visit (INDEPENDENT_AMBULATORY_CARE_PROVIDER_SITE_OTHER)
Admission: RE | Admit: 2018-05-02 | Discharge: 2018-05-02 | Disposition: A | Discharge status: Home / Self Care | Payer: Medicare Other | Source: Ambulatory Visit | Attending: Acute Care | Admitting: Acute Care

## 2018-05-02 DIAGNOSIS — Z87891 Personal history of nicotine dependence: Secondary | ICD-10-CM

## 2018-05-02 DIAGNOSIS — Z122 Encounter for screening for malignant neoplasm of respiratory organs: Secondary | ICD-10-CM

## 2018-05-06 ENCOUNTER — Other Ambulatory Visit: Payer: Self-pay | Admitting: Acute Care

## 2018-05-06 DIAGNOSIS — Z122 Encounter for screening for malignant neoplasm of respiratory organs: Secondary | ICD-10-CM

## 2018-05-06 DIAGNOSIS — Z87891 Personal history of nicotine dependence: Secondary | ICD-10-CM

## 2018-05-10 ENCOUNTER — Ambulatory Visit (INDEPENDENT_AMBULATORY_CARE_PROVIDER_SITE_OTHER): Payer: Medicare Other

## 2018-05-10 DIAGNOSIS — E538 Deficiency of other specified B group vitamins: Secondary | ICD-10-CM | POA: Diagnosis not present

## 2018-05-10 MED ORDER — CYANOCOBALAMIN 1000 MCG/ML IJ SOLN
1000.0000 ug | Freq: Once | INTRAMUSCULAR | Status: AC
  Administered 2018-05-10: 1000 ug via INTRAMUSCULAR

## 2018-05-10 NOTE — Progress Notes (Signed)
Pre visit review using our clinic tool,if applicable. No additional management support is needed unless otherwise documented below in the visit note.   Pt here for monthly B12 injection per order from Dr. Carollee Herter.  B12 1064mcg given IMright deltoid, and pt tolerated injection well. No complaints voiced this visit.  Next B12 injection scheduled for 06/11/18 @9 :30 am.

## 2018-05-13 DIAGNOSIS — E119 Type 2 diabetes mellitus without complications: Secondary | ICD-10-CM | POA: Diagnosis not present

## 2018-05-13 DIAGNOSIS — H401131 Primary open-angle glaucoma, bilateral, mild stage: Secondary | ICD-10-CM | POA: Diagnosis not present

## 2018-05-13 DIAGNOSIS — H26492 Other secondary cataract, left eye: Secondary | ICD-10-CM | POA: Diagnosis not present

## 2018-05-27 DIAGNOSIS — H26491 Other secondary cataract, right eye: Secondary | ICD-10-CM | POA: Diagnosis not present

## 2018-05-28 ENCOUNTER — Other Ambulatory Visit: Payer: Self-pay | Admitting: Family Medicine

## 2018-05-31 ENCOUNTER — Other Ambulatory Visit: Payer: Self-pay | Admitting: Family Medicine

## 2018-05-31 DIAGNOSIS — Z1231 Encounter for screening mammogram for malignant neoplasm of breast: Secondary | ICD-10-CM

## 2018-06-11 ENCOUNTER — Ambulatory Visit (INDEPENDENT_AMBULATORY_CARE_PROVIDER_SITE_OTHER): Payer: Medicare Other

## 2018-06-11 DIAGNOSIS — E538 Deficiency of other specified B group vitamins: Secondary | ICD-10-CM

## 2018-06-11 MED ORDER — CYANOCOBALAMIN 1000 MCG/ML IJ SOLN
1000.0000 ug | Freq: Once | INTRAMUSCULAR | Status: AC
  Administered 2018-06-11: 1000 ug via INTRAMUSCULAR

## 2018-06-11 NOTE — Progress Notes (Signed)
Pre visit review using our clinic tool,if applicable. No additional management support is needed unless otherwise documented below in the visit note.   Pt here for monthly B12 injection per order from Dr. Carollee Herter due to B12 defeciency.   B12 1060mcg given IM, and pt tolerated injection well.  Next B12 injection scheduled for January 17th 2020. No complaints voiced this visit.

## 2018-06-12 DIAGNOSIS — M65341 Trigger finger, right ring finger: Secondary | ICD-10-CM | POA: Diagnosis not present

## 2018-06-17 LAB — HM DIABETES EYE EXAM

## 2018-06-20 ENCOUNTER — Telehealth: Payer: Self-pay | Admitting: *Deleted

## 2018-06-20 NOTE — Telephone Encounter (Signed)
Received Diabetic Eye Exam Report from [facility]; forwarded to provider/SLS 12/26

## 2018-06-28 ENCOUNTER — Other Ambulatory Visit: Payer: Self-pay | Admitting: Family Medicine

## 2018-06-28 DIAGNOSIS — F329 Major depressive disorder, single episode, unspecified: Secondary | ICD-10-CM

## 2018-06-28 DIAGNOSIS — F32A Depression, unspecified: Secondary | ICD-10-CM

## 2018-07-04 DIAGNOSIS — H02413 Mechanical ptosis of bilateral eyelids: Secondary | ICD-10-CM | POA: Diagnosis not present

## 2018-07-04 DIAGNOSIS — D485 Neoplasm of uncertain behavior of skin: Secondary | ICD-10-CM | POA: Diagnosis not present

## 2018-07-04 DIAGNOSIS — H02834 Dermatochalasis of left upper eyelid: Secondary | ICD-10-CM | POA: Diagnosis not present

## 2018-07-04 DIAGNOSIS — H02831 Dermatochalasis of right upper eyelid: Secondary | ICD-10-CM | POA: Diagnosis not present

## 2018-07-04 DIAGNOSIS — H57813 Brow ptosis, bilateral: Secondary | ICD-10-CM | POA: Diagnosis not present

## 2018-07-05 ENCOUNTER — Other Ambulatory Visit: Payer: Self-pay | Admitting: Orthopedic Surgery

## 2018-07-05 DIAGNOSIS — M720 Palmar fascial fibromatosis [Dupuytren]: Secondary | ICD-10-CM | POA: Diagnosis not present

## 2018-07-05 DIAGNOSIS — M65342 Trigger finger, left ring finger: Secondary | ICD-10-CM | POA: Diagnosis not present

## 2018-07-08 ENCOUNTER — Ambulatory Visit: Payer: Medicare Other | Admitting: Endocrinology

## 2018-07-08 ENCOUNTER — Encounter: Payer: Self-pay | Admitting: Endocrinology

## 2018-07-08 VITALS — BP 128/60 | HR 81 | Ht 66.0 in | Wt 155.4 lb

## 2018-07-08 DIAGNOSIS — E1142 Type 2 diabetes mellitus with diabetic polyneuropathy: Secondary | ICD-10-CM | POA: Diagnosis not present

## 2018-07-08 DIAGNOSIS — E041 Nontoxic single thyroid nodule: Secondary | ICD-10-CM | POA: Diagnosis not present

## 2018-07-08 DIAGNOSIS — E1165 Type 2 diabetes mellitus with hyperglycemia: Secondary | ICD-10-CM | POA: Diagnosis not present

## 2018-07-08 DIAGNOSIS — E1151 Type 2 diabetes mellitus with diabetic peripheral angiopathy without gangrene: Secondary | ICD-10-CM | POA: Diagnosis not present

## 2018-07-08 DIAGNOSIS — IMO0002 Reserved for concepts with insufficient information to code with codable children: Secondary | ICD-10-CM

## 2018-07-08 LAB — POCT GLYCOSYLATED HEMOGLOBIN (HGB A1C): Hemoglobin A1C: 6.5 % — AB (ref 4.0–5.6)

## 2018-07-08 LAB — TSH: TSH: 4.23 u[IU]/mL (ref 0.35–4.50)

## 2018-07-08 NOTE — Progress Notes (Signed)
Subjective:    Patient ID: Alyssa Ford, female    DOB: [birth year], 68 y.o.   MRN: [id number]  HPI Pt returns for f/u of diabetes mellitus: DM type: 2 Dx'ed: 5456 Complications: polyneuropathy and CAD (seen on CT).  Therapy: 3 oral meds GDM: never DKA: never Severe hypoglycemia: never Pancreatitis: never Pancreatic imaging: never Other: she has never been on insulin; she wants generic meds, if possible.   Interval history: Pt states cbg's are well-controlled.  She takes meds as rx'ed.  pt states she feels well in general.   Past Medical History:  Diagnosis Date  . Anemia   . Anxiety   . Arthritis   . Cataract   . Chronic headaches   . Depression   . Diabetes mellitus   . Diverticulosis of colon (without mention of hemorrhage) 2003, 2008   Colonoscopy  . GERD (gastroesophageal reflux disease)   . Glaucoma   . Hiatal hernia 2003   EGD   . Hyperlipidemia   . Hypertension   . Thyroid nodule     Past Surgical History:  Procedure Laterality Date  . CARPAL TUNNEL RELEASE  2006  . CARPAL TUNNEL RELEASE  12/24/2007  . CATARACT EXTRACTION, BILATERAL     [facility]---  sept and oct 2018  . CERVICAL FUSION  3/08  . CHOLECYSTECTOMY      Social History   Socioeconomic History  . Marital status: Married    Spouse name: Not on file  . Number of children: Not on file  . Years of education: Not on file  . Highest education level: Not on file  Occupational History  . Occupation: retired  Scientific laboratory technician  . Financial resource strain: Not on file  . Food insecurity:    Worry: Not on file    Inability: Not on file  . Transportation needs:    Medical: Not on file    Non-medical: Not on file  Tobacco Use  . Smoking status: Former Smoker    Packs/day: 1.40    Years: 49.00    Pack years: 68.60    Types: Cigarettes    Last attempt to quit: 07/16/2015    Years since quitting: 2.9  . Smokeless tobacco: Never Used  . Tobacco comment: e cig  Substance and Sexual Activity  .  Alcohol use: Yes    Alcohol/week: 0.0 standard drinks    Comment: once weekly; wine and beer  . Drug use: No  . Sexual activity: Yes    Partners: Male  Lifestyle  . Physical activity:    Days per week: Not on file    Minutes per session: Not on file  . Stress: Not on file  Relationships  . Social connections:    Talks on phone: Not on file    Gets together: Not on file    Attends religious service: Not on file    Active member of club or organization: Not on file    Attends meetings of clubs or organizations: Not on file    Relationship status: Not on file  . Intimate partner violence:    Fear of current or ex partner: Not on file    Emotionally abused: Not on file    Physically abused: Not on file    Forced sexual activity: Not on file  Other Topics Concern  . Not on file  Social History Narrative   Exercise--  walk    Current Outpatient Medications on File Prior to Visit  Medication Sig Dispense Refill  .  amitriptyline (ELAVIL) 25 MG tablet TAKE 2 TABLETS BY MOUTH ONCE DAILY 60 tablet 5  . aspirin EC 81 MG tablet Take 81 mg by mouth daily.    Marland Kitchen atorvastatin (LIPITOR) 80 MG tablet TAKE 1 TABLET BY MOUTH ONCE DAILY 90 tablet 1  . BIOTIN PO Take 1 tablet by mouth daily.     . bromocriptine (PARLODEL) 2.5 MG tablet Take 0.5 tablets (1.25 mg total) by mouth daily. 15 tablet 6  . buPROPion (WELLBUTRIN XL) 150 MG 24 hr tablet TAKE 2 TABLETS BY MOUTH ONCE DAILY IN THE MORNING 180 tablet 1  . Coenzyme Q10 (CO Q 10 PO) Take 1 capsule by mouth daily.    . diazepam (VALIUM) 5 MG tablet TAKE 1 TABLET BY MOUTH AT BEDTIME AS NEEDED FOR ANXIETY 30 tablet 0  . Ferrous Sulfate (IRON SUPPLEMENT PO) Take 2 tablets by mouth daily.     . hydrochlorothiazide (HYDRODIURIL) 25 MG tablet TAKE 1 TABLET BY MOUTH ONCE DAILY 90 tablet 1  . latanoprost (XALATAN) 0.005 % ophthalmic solution Place 1 drop into both eyes at bedtime.     . metFORMIN (GLUCOPHAGE-XR) 500 MG 24 hr tablet Take 4 tablets (2000  mg) daily with breakfast. 120 tablet 11  . Naproxen Sodium (ALEVE PO) Take 2 tablets by mouth daily.    Marland Kitchen omeprazole (PRILOSEC) 40 MG capsule TAKE 1 CAPSULE BY MOUTH ONCE DAILY 90 capsule 3  . repaglinide (PRANDIN) 2 MG tablet 1 tab with breakfast, 1/2 with lunch, and 1 with supper 75 tablet 11   Current Facility-Administered Medications on File Prior to Visit  Medication Dose Route Frequency Provider Last Rate Last Dose  . 0.9 %  sodium chloride infusion  500 mL Intravenous Once Nandigam, Venia Minks, MD        Allergies  Allergen Reactions  . Codeine Nausea Only    Severe nausea    Family History  Problem Relation Age of Onset  . Dementia Mother   . Breast cancer Mother 48  . Kidney failure Mother 63       died from kidney failure from abx given to her for pneumonia  . Hyperlipidemia Mother   . Hypertension Mother   . Diabetes Mother   . Cancer Mother 87       breast  . Parkinsonism Father   . Diabetes Father 51  . Hypertension Sister   . Diabetes Brother   . Hypertension Brother   . Heart attack Brother   . Heart disease Cousin 68       mi  . Colon cancer Neg Hx     BP 128/60 (BP Location: Right Arm, Patient Position: Sitting, Cuff Size: Normal)   Pulse 81   Ht 5\' 6"  (1.676 m)   Wt 155 lb 6.4 oz (70.5 kg)   SpO2 97%   BMI 25.08 kg/m   Review of Systems She denies hypoglycemia    Objective:   Physical Exam VITAL SIGNS:  See vs page GENERAL: no distress NECK: There is no palpable thyroid enlargement.  No thyroid nodule is palpable.  No palpable lymphadenopathy at the anterior neck. Pulses: dorsalis pedis intact bilat.   MSK: no deformity of the feet CV: no leg edema Skin:  no ulcer on the feet.  normal color and temp on the feet. Neuro: sensation is intact to touch on the feet  A1c=6.5%  Lab Results  Component Value Date   CREATININE 0.90 04/08/2018   BUN 14 04/08/2018   NA 138 04/08/2018  K 3.6 04/08/2018   CL 96 04/08/2018   CO2 29 04/08/2018    Lab Results  Component Value Date   MICROALBUR 1.1 04/08/2018   Lab Results  Component Value Date   TSH 2.89 03/18/2015        Assessment & Plan:  Type 2 DM, with CAD: well-controlled Thyroid nodule, new to me.  F/u is due.  Patient Instructions  Please continue the same medications check your blood sugar once a day.  vary the time of day when you check, between before the 3 meals, and at bedtime.  also check if you have symptoms of your blood sugar being too high or too low.  please keep a record of the readings and bring it to your next appointment here (or you can bring the meter itself).  You can write it on any piece of paper.  please call us sooner if your blood sugar goes below 70, or if you have a lot of readings over 200. Let's recheck the ultrasound.  you will receive a phone call, about a day and time for an appointment. Please come back for a follow-up appointment in 6 months.

## 2018-07-08 NOTE — Patient Instructions (Signed)
Please continue the same medications check your blood sugar once a day.  vary the time of day when you check, between before the 3 meals, and at bedtime.  also check if you have symptoms of your blood sugar being too high or too low.  please keep a record of the readings and bring it to your next appointment here (or you can bring the meter itself).  You can write it on any piece of paper.  please call us sooner if your blood sugar goes below 70, or if you have a lot of readings over 200. Let's recheck the ultrasound.  you will receive a phone call, about a day and time for an appointment. Please come back for a follow-up appointment in 6 months.

## 2018-07-10 DIAGNOSIS — H53481 Generalized contraction of visual field, right eye: Secondary | ICD-10-CM | POA: Diagnosis not present

## 2018-07-10 DIAGNOSIS — H53483 Generalized contraction of visual field, bilateral: Secondary | ICD-10-CM | POA: Diagnosis not present

## 2018-07-10 DIAGNOSIS — H53482 Generalized contraction of visual field, left eye: Secondary | ICD-10-CM | POA: Diagnosis not present

## 2018-07-11 ENCOUNTER — Ambulatory Visit
Admission: RE | Admit: 2018-07-11 | Discharge: 2018-07-11 | Disposition: A | Discharge status: Home / Self Care | Payer: Medicare Other | Source: Ambulatory Visit | Attending: Endocrinology | Admitting: Endocrinology

## 2018-07-11 ENCOUNTER — Other Ambulatory Visit: Payer: Self-pay | Admitting: Endocrinology

## 2018-07-11 ENCOUNTER — Encounter: Payer: Self-pay | Admitting: Family Medicine

## 2018-07-11 DIAGNOSIS — E049 Nontoxic goiter, unspecified: Secondary | ICD-10-CM | POA: Diagnosis not present

## 2018-07-11 DIAGNOSIS — E041 Nontoxic single thyroid nodule: Secondary | ICD-10-CM

## 2018-07-11 DIAGNOSIS — R221 Localized swelling, mass and lump, neck: Secondary | ICD-10-CM | POA: Insufficient documentation

## 2018-07-12 ENCOUNTER — Ambulatory Visit
Admission: RE | Admit: 2018-07-12 | Discharge: 2018-07-12 | Disposition: A | Discharge status: Home / Self Care | Payer: Medicare Other | Source: Ambulatory Visit | Attending: Family Medicine | Admitting: Family Medicine

## 2018-07-12 ENCOUNTER — Ambulatory Visit: Payer: Medicare Other

## 2018-07-12 DIAGNOSIS — Z1231 Encounter for screening mammogram for malignant neoplasm of breast: Secondary | ICD-10-CM | POA: Diagnosis not present

## 2018-07-15 ENCOUNTER — Telehealth: Payer: Self-pay | Admitting: *Deleted

## 2018-07-15 ENCOUNTER — Other Ambulatory Visit: Payer: Self-pay | Admitting: Family Medicine

## 2018-07-15 DIAGNOSIS — F320 Major depressive disorder, single episode, mild: Secondary | ICD-10-CM

## 2018-07-15 NOTE — Telephone Encounter (Signed)
b12 added

## 2018-07-15 NOTE — Telephone Encounter (Signed)
Pt has lab appt on 07/17/18 for PTH, lipids, cmet. Pt is asking to have B12 checked as well. Please advise?

## 2018-07-16 ENCOUNTER — Telehealth: Payer: Self-pay | Admitting: Endocrinology

## 2018-07-16 NOTE — Telephone Encounter (Signed)
Called pt and informed of referral process. Verbalized acceptance and understanding. 

## 2018-07-16 NOTE — Telephone Encounter (Signed)
OK, you will receive a phone call, about a day and time for an appointment

## 2018-07-16 NOTE — Telephone Encounter (Signed)
Please advise 

## 2018-07-16 NOTE — Telephone Encounter (Signed)
Patient states saw her ultra sound results on my chart and that she would like to go ahead and get a referral to an ENT.  Requesting a call back

## 2018-07-17 ENCOUNTER — Other Ambulatory Visit (INDEPENDENT_AMBULATORY_CARE_PROVIDER_SITE_OTHER): Payer: Medicare Other

## 2018-07-17 ENCOUNTER — Other Ambulatory Visit: Payer: Self-pay

## 2018-07-17 ENCOUNTER — Ambulatory Visit (INDEPENDENT_AMBULATORY_CARE_PROVIDER_SITE_OTHER): Payer: Medicare Other

## 2018-07-17 DIAGNOSIS — E538 Deficiency of other specified B group vitamins: Secondary | ICD-10-CM

## 2018-07-17 DIAGNOSIS — E785 Hyperlipidemia, unspecified: Secondary | ICD-10-CM

## 2018-07-17 DIAGNOSIS — F320 Major depressive disorder, single episode, mild: Secondary | ICD-10-CM | POA: Diagnosis not present

## 2018-07-17 DIAGNOSIS — I10 Essential (primary) hypertension: Secondary | ICD-10-CM | POA: Diagnosis not present

## 2018-07-17 LAB — COMPREHENSIVE METABOLIC PANEL
ALT: 16 U/L (ref 0–35)
AST: 13 U/L (ref 0–37)
Albumin: 4.4 g/dL (ref 3.5–5.2)
Alkaline Phosphatase: 93 U/L (ref 39–117)
BUN: 12 mg/dL (ref 6–23)
CO2: 33 mEq/L — ABNORMAL HIGH (ref 19–32)
Calcium: 9.3 mg/dL (ref 8.4–10.5)
Chloride: 96 mEq/L (ref 96–112)
Creatinine, Ser: 0.74 mg/dL (ref 0.40–1.20)
GFR: 77.97 mL/min (ref >60.00)
Glucose, Bld: 163 mg/dL — ABNORMAL HIGH (ref 70–99)
Potassium: 3.5 mEq/L (ref 3.5–5.1)
Sodium: 138 mEq/L (ref 135–145)
Total Bilirubin: 0.4 mg/dL (ref 0.2–1.2)
Total Protein: 6.8 g/dL (ref 6.0–8.3)

## 2018-07-17 LAB — LIPID PANEL
Cholesterol: 129 mg/dL (ref 0–200)
HDL: 44.1 mg/dL (ref >39.00)
LDL Cholesterol: 67 mg/dL (ref 0–99)
NonHDL: 84.42
Total CHOL/HDL Ratio: 3
Triglycerides: 88 mg/dL (ref 0.0–149.0)
VLDL: 17.6 mg/dL (ref 0.0–40.0)

## 2018-07-17 MED ORDER — CYANOCOBALAMIN 1000 MCG/ML IJ SOLN
1000.0000 ug | Freq: Once | INTRAMUSCULAR | Status: AC
  Administered 2018-07-17: 1000 ug via INTRAMUSCULAR

## 2018-07-17 NOTE — Progress Notes (Signed)
Pre visit review using our clinic review tool, if applicable. No additional management support is needed unless otherwise documented below in the visit note.  Pt here for monthly B12 injection per Dr. Etter Sjogren  B12 1036mcg given IM in right deltoid +and pt tolerated injection well.  Next B12 injection scheduled for 08/20/2018

## 2018-07-18 LAB — VITAMIN B12: Vitamin B-12: 360 pg/mL (ref 211–911)

## 2018-07-18 LAB — PARATHYROID HORMONE, INTACT (NO CA): PTH: 63 pg/mL (ref 14–64)

## 2018-07-22 ENCOUNTER — Other Ambulatory Visit: Payer: Self-pay | Admitting: Endocrinology

## 2018-07-24 ENCOUNTER — Encounter (HOSPITAL_BASED_OUTPATIENT_CLINIC_OR_DEPARTMENT_OTHER): Payer: Self-pay | Admitting: *Deleted

## 2018-07-25 ENCOUNTER — Encounter (HOSPITAL_BASED_OUTPATIENT_CLINIC_OR_DEPARTMENT_OTHER): Payer: Self-pay | Admitting: *Deleted

## 2018-07-25 ENCOUNTER — Other Ambulatory Visit: Payer: Self-pay

## 2018-07-29 ENCOUNTER — Encounter (HOSPITAL_BASED_OUTPATIENT_CLINIC_OR_DEPARTMENT_OTHER)
Admission: RE | Admit: 2018-07-29 | Discharge: 2018-07-29 | Disposition: A | Discharge status: Home / Self Care | Payer: Medicare Other | Source: Ambulatory Visit | Attending: Orthopedic Surgery | Admitting: Orthopedic Surgery

## 2018-07-29 DIAGNOSIS — Z791 Long term (current) use of non-steroidal anti-inflammatories (NSAID): Secondary | ICD-10-CM | POA: Diagnosis not present

## 2018-07-29 DIAGNOSIS — M65342 Trigger finger, left ring finger: Secondary | ICD-10-CM | POA: Diagnosis not present

## 2018-07-29 DIAGNOSIS — H409 Unspecified glaucoma: Secondary | ICD-10-CM | POA: Diagnosis not present

## 2018-07-29 DIAGNOSIS — Z79899 Other long term (current) drug therapy: Secondary | ICD-10-CM | POA: Diagnosis not present

## 2018-07-29 DIAGNOSIS — I10 Essential (primary) hypertension: Secondary | ICD-10-CM | POA: Diagnosis not present

## 2018-07-29 DIAGNOSIS — M1990 Unspecified osteoarthritis, unspecified site: Secondary | ICD-10-CM | POA: Diagnosis not present

## 2018-07-29 DIAGNOSIS — K219 Gastro-esophageal reflux disease without esophagitis: Secondary | ICD-10-CM | POA: Diagnosis not present

## 2018-07-29 DIAGNOSIS — Z87891 Personal history of nicotine dependence: Secondary | ICD-10-CM | POA: Diagnosis not present

## 2018-07-29 DIAGNOSIS — F329 Major depressive disorder, single episode, unspecified: Secondary | ICD-10-CM | POA: Diagnosis not present

## 2018-07-29 DIAGNOSIS — E785 Hyperlipidemia, unspecified: Secondary | ICD-10-CM | POA: Diagnosis not present

## 2018-07-29 DIAGNOSIS — Z7984 Long term (current) use of oral hypoglycemic drugs: Secondary | ICD-10-CM | POA: Diagnosis not present

## 2018-07-29 DIAGNOSIS — E119 Type 2 diabetes mellitus without complications: Secondary | ICD-10-CM | POA: Diagnosis not present

## 2018-07-29 DIAGNOSIS — Z7982 Long term (current) use of aspirin: Secondary | ICD-10-CM | POA: Diagnosis not present

## 2018-07-29 DIAGNOSIS — M720 Palmar fascial fibromatosis [Dupuytren]: Secondary | ICD-10-CM | POA: Diagnosis not present

## 2018-07-29 DIAGNOSIS — Z01810 Encounter for preprocedural cardiovascular examination: Secondary | ICD-10-CM | POA: Insufficient documentation

## 2018-07-29 DIAGNOSIS — F419 Anxiety disorder, unspecified: Secondary | ICD-10-CM | POA: Diagnosis not present

## 2018-07-29 DIAGNOSIS — M65842 Other synovitis and tenosynovitis, left hand: Secondary | ICD-10-CM | POA: Diagnosis not present

## 2018-07-30 ENCOUNTER — Other Ambulatory Visit: Payer: Self-pay | Admitting: Family Medicine

## 2018-07-30 NOTE — Progress Notes (Signed)
EKG reviewed by Dr. Jenita Seashore, will proceed with surgery as scheduled.

## 2018-08-01 ENCOUNTER — Other Ambulatory Visit: Payer: Self-pay

## 2018-08-01 ENCOUNTER — Ambulatory Visit (HOSPITAL_BASED_OUTPATIENT_CLINIC_OR_DEPARTMENT_OTHER): Payer: Medicare Other | Admitting: Anesthesiology

## 2018-08-01 ENCOUNTER — Ambulatory Visit (HOSPITAL_BASED_OUTPATIENT_CLINIC_OR_DEPARTMENT_OTHER)
Admission: RE | Admit: 2018-08-01 | Discharge: 2018-08-01 | Disposition: A | Discharge status: Home / Self Care | Payer: Medicare Other | Attending: Orthopedic Surgery | Admitting: Orthopedic Surgery

## 2018-08-01 ENCOUNTER — Encounter (HOSPITAL_BASED_OUTPATIENT_CLINIC_OR_DEPARTMENT_OTHER)
Admission: RE | Disposition: A | Discharge status: Home / Self Care | Payer: Self-pay | Source: Home / Self Care | Attending: Orthopedic Surgery

## 2018-08-01 ENCOUNTER — Encounter (HOSPITAL_BASED_OUTPATIENT_CLINIC_OR_DEPARTMENT_OTHER): Payer: Self-pay | Admitting: *Deleted

## 2018-08-01 DIAGNOSIS — E119 Type 2 diabetes mellitus without complications: Secondary | ICD-10-CM | POA: Insufficient documentation

## 2018-08-01 DIAGNOSIS — Z79899 Other long term (current) drug therapy: Secondary | ICD-10-CM | POA: Diagnosis not present

## 2018-08-01 DIAGNOSIS — Z7982 Long term (current) use of aspirin: Secondary | ICD-10-CM | POA: Diagnosis not present

## 2018-08-01 DIAGNOSIS — M65842 Other synovitis and tenosynovitis, left hand: Secondary | ICD-10-CM | POA: Insufficient documentation

## 2018-08-01 DIAGNOSIS — E875 Hyperkalemia: Secondary | ICD-10-CM | POA: Diagnosis not present

## 2018-08-01 DIAGNOSIS — Z87891 Personal history of nicotine dependence: Secondary | ICD-10-CM | POA: Insufficient documentation

## 2018-08-01 DIAGNOSIS — M1990 Unspecified osteoarthritis, unspecified site: Secondary | ICD-10-CM | POA: Diagnosis not present

## 2018-08-01 DIAGNOSIS — Z7984 Long term (current) use of oral hypoglycemic drugs: Secondary | ICD-10-CM | POA: Insufficient documentation

## 2018-08-01 DIAGNOSIS — K219 Gastro-esophageal reflux disease without esophagitis: Secondary | ICD-10-CM | POA: Insufficient documentation

## 2018-08-01 DIAGNOSIS — M720 Palmar fascial fibromatosis [Dupuytren]: Secondary | ICD-10-CM | POA: Diagnosis not present

## 2018-08-01 DIAGNOSIS — F329 Major depressive disorder, single episode, unspecified: Secondary | ICD-10-CM | POA: Insufficient documentation

## 2018-08-01 DIAGNOSIS — I10 Essential (primary) hypertension: Secondary | ICD-10-CM | POA: Diagnosis not present

## 2018-08-01 DIAGNOSIS — H409 Unspecified glaucoma: Secondary | ICD-10-CM | POA: Diagnosis not present

## 2018-08-01 DIAGNOSIS — Z791 Long term (current) use of non-steroidal anti-inflammatories (NSAID): Secondary | ICD-10-CM | POA: Diagnosis not present

## 2018-08-01 DIAGNOSIS — M65342 Trigger finger, left ring finger: Secondary | ICD-10-CM | POA: Insufficient documentation

## 2018-08-01 DIAGNOSIS — F419 Anxiety disorder, unspecified: Secondary | ICD-10-CM | POA: Insufficient documentation

## 2018-08-01 DIAGNOSIS — E785 Hyperlipidemia, unspecified: Secondary | ICD-10-CM | POA: Diagnosis not present

## 2018-08-01 HISTORY — PX: FASCIECTOMY: SHX6525

## 2018-08-01 HISTORY — PX: TRIGGER FINGER RELEASE: SHX641

## 2018-08-01 LAB — GLUCOSE, CAPILLARY
Glucose-Capillary: 171 mg/dL — ABNORMAL HIGH (ref 70–99)
Glucose-Capillary: 145 mg/dL — ABNORMAL HIGH (ref 70–99)

## 2018-08-01 SURGERY — FASCIECTOMY, PALM
Anesthesia: Monitor Anesthesia Care | Site: Hand | Laterality: Left

## 2018-08-01 MED ORDER — ONDANSETRON HCL 4 MG/2ML IJ SOLN
INTRAMUSCULAR | Status: DC | PRN
  Administered 2018-08-01: 4 mg via INTRAVENOUS

## 2018-08-01 MED ORDER — BUPIVACAINE HCL (PF) 0.25 % IJ SOLN
INTRAMUSCULAR | Status: DC | PRN
  Administered 2018-08-01: 7 mL

## 2018-08-01 MED ORDER — CHLORHEXIDINE GLUCONATE 4 % EX LIQD: 60.0000 mL | Freq: Once | CUTANEOUS | Status: DC

## 2018-08-01 MED ORDER — ACETAMINOPHEN 500 MG PO TABS: 1000.0000 mg | ORAL_TABLET | Freq: Once | ORAL | Status: DC

## 2018-08-01 MED ORDER — TRAMADOL HCL 50 MG PO TABS: 50.0000 mg | ORAL_TABLET | Freq: Four times a day (QID) | ORAL | 0 refills | Status: DC | PRN

## 2018-08-01 MED ORDER — THROMBIN 5000 UNITS EX SOLR
CUTANEOUS | Status: AC
  Filled 2018-08-01: qty 5000

## 2018-08-01 MED ORDER — MIDAZOLAM HCL 2 MG/2ML IJ SOLN
INTRAMUSCULAR | Status: AC
  Filled 2018-08-01: qty 2

## 2018-08-01 MED ORDER — LACTATED RINGERS IV SOLN
INTRAVENOUS | Status: DC
  Administered 2018-08-01: 10:00:00 via INTRAVENOUS

## 2018-08-01 MED ORDER — PROPOFOL 500 MG/50ML IV EMUL
INTRAVENOUS | Status: DC | PRN
  Administered 2018-08-01: 50 ug/kg/min via INTRAVENOUS

## 2018-08-01 MED ORDER — FENTANYL CITRATE (PF) 100 MCG/2ML IJ SOLN: 25.0000 ug | INTRAMUSCULAR | Status: DC | PRN

## 2018-08-01 MED ORDER — MIDAZOLAM HCL 5 MG/5ML IJ SOLN
INTRAMUSCULAR | Status: DC | PRN
  Administered 2018-08-01: 1 mg via INTRAVENOUS

## 2018-08-01 MED ORDER — FENTANYL CITRATE (PF) 100 MCG/2ML IJ SOLN
INTRAMUSCULAR | Status: AC
  Filled 2018-08-01: qty 2

## 2018-08-01 MED ORDER — FENTANYL CITRATE (PF) 100 MCG/2ML IJ SOLN: 50.0000 ug | INTRAMUSCULAR | Status: DC | PRN

## 2018-08-01 MED ORDER — LIDOCAINE HCL (PF) 0.5 % IJ SOLN
INTRAMUSCULAR | Status: DC | PRN
  Administered 2018-08-01: 45 mL via INTRAVENOUS

## 2018-08-01 MED ORDER — MIDAZOLAM HCL 2 MG/2ML IJ SOLN: 1.0000 mg | INTRAMUSCULAR | Status: DC | PRN

## 2018-08-01 MED ORDER — PROPOFOL 10 MG/ML IV BOLUS
INTRAVENOUS | Status: AC
  Filled 2018-08-01: qty 20

## 2018-08-01 MED ORDER — SCOPOLAMINE 1 MG/3DAYS TD PT72: 1.0000 | MEDICATED_PATCH | Freq: Once | TRANSDERMAL | Status: DC | PRN

## 2018-08-01 MED ORDER — FENTANYL CITRATE (PF) 100 MCG/2ML IJ SOLN
INTRAMUSCULAR | Status: DC | PRN
  Administered 2018-08-01 (×2): 50 ug via INTRAVENOUS

## 2018-08-01 MED ORDER — ONDANSETRON HCL 4 MG/2ML IJ SOLN
INTRAMUSCULAR | Status: AC
  Filled 2018-08-01: qty 2

## 2018-08-01 MED ORDER — CEFAZOLIN SODIUM-DEXTROSE 2-4 GM/100ML-% IV SOLN
2.0000 g | INTRAVENOUS | Status: AC
  Administered 2018-08-01: 2 g via INTRAVENOUS

## 2018-08-01 MED ORDER — PROPOFOL 10 MG/ML IV BOLUS
INTRAVENOUS | Status: DC | PRN
  Administered 2018-08-01 (×2): 20 mg via INTRAVENOUS
  Administered 2018-08-01 (×2): 10 mg via INTRAVENOUS

## 2018-08-01 MED ORDER — CEFAZOLIN SODIUM-DEXTROSE 2-4 GM/100ML-% IV SOLN
INTRAVENOUS | Status: AC
  Filled 2018-08-01: qty 100

## 2018-08-01 SURGICAL SUPPLY — 40 items
BANDAGE COBAN STERILE 2 (GAUZE/BANDAGES/DRESSINGS) ×3 IMPLANT
BLADE MINI RND TIP GREEN BEAV (BLADE) ×3 IMPLANT
BLADE SURG 15 STRL LF DISP TIS (BLADE) ×2 IMPLANT
BLADE SURG 15 STRL SS (BLADE) ×1
BNDG COHESIVE 3X5 TAN STRL LF (GAUZE/BANDAGES/DRESSINGS) ×3 IMPLANT
BNDG ESMARK 4X9 LF (GAUZE/BANDAGES/DRESSINGS) ×3 IMPLANT
BNDG GAUZE ELAST 4 BULKY (GAUZE/BANDAGES/DRESSINGS) ×3 IMPLANT
CHLORAPREP W/TINT 26ML (MISCELLANEOUS) ×3 IMPLANT
CORD BIPOLAR FORCEPS 12FT (ELECTRODE) ×3 IMPLANT
COVER BACK TABLE 60X90IN (DRAPES) ×3 IMPLANT
COVER MAYO STAND STRL (DRAPES) ×3 IMPLANT
COVER WAND RF STERILE (DRAPES) IMPLANT
CUFF TOURNIQUET SINGLE 18IN (TOURNIQUET CUFF) ×3 IMPLANT
DECANTER SPIKE VIAL GLASS SM (MISCELLANEOUS) IMPLANT
DRAPE EXTREMITY T 121X128X90 (DISPOSABLE) ×3 IMPLANT
DRAPE SURG 17X23 STRL (DRAPES) ×3 IMPLANT
GAUZE SPONGE 4X4 12PLY STRL (GAUZE/BANDAGES/DRESSINGS) ×3 IMPLANT
GAUZE XEROFORM 1X8 LF (GAUZE/BANDAGES/DRESSINGS) ×3 IMPLANT
GLOVE BIOGEL PI IND STRL 8.5 (GLOVE) ×2 IMPLANT
GLOVE BIOGEL PI INDICATOR 8.5 (GLOVE) ×1
GLOVE SURG ORTHO 8.0 STRL STRW (GLOVE) ×3 IMPLANT
GOWN STRL REUS W/ TWL LRG LVL3 (GOWN DISPOSABLE) ×2 IMPLANT
GOWN STRL REUS W/TWL LRG LVL3 (GOWN DISPOSABLE) ×1
GOWN STRL REUS W/TWL XL LVL3 (GOWN DISPOSABLE) ×3 IMPLANT
LOOP VESSEL MAXI BLUE (MISCELLANEOUS) ×3 IMPLANT
NEEDLE PRECISIONGLIDE 27X1.5 (NEEDLE) ×3 IMPLANT
NS IRRIG 1000ML POUR BTL (IV SOLUTION) ×3 IMPLANT
PACK BASIN DAY SURGERY FS (CUSTOM PROCEDURE TRAY) ×3 IMPLANT
PAD CAST 3X4 CTTN HI CHSV (CAST SUPPLIES) ×2 IMPLANT
PADDING CAST COTTON 3X4 STRL (CAST SUPPLIES) ×1
SLEEVE SCD COMPRESS KNEE MED (MISCELLANEOUS) ×3 IMPLANT
SPLINT PLASTER CAST XFAST 3X15 (CAST SUPPLIES) IMPLANT
SPLINT PLASTER XTRA FASTSET 3X (CAST SUPPLIES)
STOCKINETTE 4X48 STRL (DRAPES) ×3 IMPLANT
SUT ETHILON 4 0 PS 2 18 (SUTURE) ×3 IMPLANT
SUT SILK 2 0 PERMA HAND 18 BK (SUTURE) IMPLANT
SYR BULB 3OZ (MISCELLANEOUS) ×3 IMPLANT
SYR CONTROL 10ML LL (SYRINGE) ×3 IMPLANT
TOWEL GREEN STERILE FF (TOWEL DISPOSABLE) ×6 IMPLANT
UNDERPAD 30X30 (UNDERPADS AND DIAPERS) ×3 IMPLANT

## 2018-08-01 NOTE — Brief Op Note (Signed)
08/01/2018  1:09 PM  PATIENT:  Alyssa Ford  68 y.o. female  PRE-OPERATIVE DIAGNOSIS:  DUPUYTREN CORD LEFT RING TRIGGER FINGER LEFT RING  POST-OPERATIVE DIAGNOSIS:  DUPUYTREN CORD LEFT RING TRIGGER FINGER LEFT RING  PROCEDURE:  Procedure(s) with comments: FASCIECTOMY LEFT RING (Left) - UPPER ARM RELEASE TRIGGER FINGER/A-1 PULLEY LEFTRING (Left)  SURGEON:  Surgeon(s) and Role:    * Daryll Brod, MD - Primary  PHYSICIAN ASSISTANT:   ASSISTANTS: none   ANESTHESIA:   local, regional and IV sedation  EBL:  52ml  BLOOD ADMINISTERED:none  DRAINS: none   LOCAL MEDICATIONS USED:  BUPIVICAINE   SPECIMEN:  Excision  DISPOSITION OF SPECIMEN:  PATHOLOGY  COUNTS:  YES  TOURNIQUET:   Total Tourniquet Time Documented: Upper Arm (Left) - 27 minutes Total: Upper Arm (Left) - 27 minutes   DICTATION: .Viviann Spare Dictation  PLAN OF CARE: Discharge to home after PACU  PATIENT DISPOSITION:  PACU - hemodynamically stable.

## 2018-08-01 NOTE — Transfer of Care (Signed)
Immediate Anesthesia Transfer of Care Note  Patient: Alyssa Ford  Procedure(s) Performed: FASCIECTOMY LEFT RING (Left Finger) RELEASE TRIGGER FINGER/A-1 PULLEY LEFTRING (Left Hand)  Patient Location: PACU  Anesthesia Type:Bier block  Level of Consciousness: awake, alert , oriented and patient cooperative  Airway & Oxygen Therapy: Patient Spontanous Breathing  Post-op Assessment: Report given to RN and Post -op Vital signs reviewed and stable  Post vital signs: Reviewed and stable  Last Vitals:  Vitals Value Taken Time  BP 140/62 08/01/2018  1:15 PM  Temp    Pulse 83 08/01/2018  1:17 PM  Resp 16 08/01/2018  1:17 PM  SpO2 95 % 08/01/2018  1:17 PM  Vitals shown include unvalidated device data.  Last Pain:  Vitals:   08/01/18 0933  TempSrc: Oral  PainSc: 2       Patients Stated Pain Goal: 3 ([id number])  Complications: No apparent anesthesia complications

## 2018-08-01 NOTE — H&P (Signed)
Alyssa Ford is an 68 y.o. female.   Chief Complaint: trigger finger left ring HPI: Alyssa Ford is a 68 year old female with triggering ring fingers bilateral hands.  She had injections to both ring fingers at that time. The left ring finger is been injected on 2 occasions and is catching. She is advised [place] descent and has a cord present. She states left ring finger is painful for her, and she has begun stopping using her hand. She shows no contracture of the finger. She has a cord with a pit at the end of it. This is over the a 1 pulley area of her ring finger left hand. States nothing seems to make it better or worse. She does have a history of diabetes and arthritis. She has no history of thyroid problems or gout. Family history is positive for gout negative for the remainder.   Past Medical History:  Diagnosis Date  . Anemia   . Anxiety   . Arthritis   . Cataract   . Chronic headaches   . Depression   . Diabetes mellitus   . Diverticulosis of colon (without mention of hemorrhage) 2003, 2008   Colonoscopy  . GERD (gastroesophageal reflux disease)   . Glaucoma   . Hiatal hernia 2003   EGD   . Hyperlipidemia   . Hypertension   . Thyroid nodule     Past Surgical History:  Procedure Laterality Date  . CARPAL TUNNEL RELEASE  2006  . CARPAL TUNNEL RELEASE  12/24/2007  . CATARACT EXTRACTION, BILATERAL     [facility]---  sept and oct 2018  . CERVICAL FUSION  3/08  . CHOLECYSTECTOMY      Family History  Problem Relation Age of Onset  . Dementia Mother   . Breast cancer Mother 21  . Kidney failure Mother 87       died from kidney failure from abx given to her for pneumonia  . Hyperlipidemia Mother   . Hypertension Mother   . Diabetes Mother   . Cancer Mother 23       breast  . Parkinsonism Father   . Diabetes Father 8  . Hypertension Sister   . Diabetes Brother   . Hypertension Brother   . Heart attack Brother   . Heart disease Cousin 87       mi  . Colon cancer Neg Hx     Social History:  reports that she quit smoking about 3 years ago. Her smoking use included cigarettes. She has a 68.60 pack-year smoking history. She has never used smokeless tobacco. She reports current alcohol use. She reports that she does not use drugs.  Allergies:  Allergies  Allergen Reactions  . Codeine Nausea Only    Severe nausea    No medications prior to admission.    No results found for this or any previous visit (from the past 48 hour(s)).  No results found.   Pertinent items are noted in HPI.  Height 5\' 6"  (1.676 m), weight 70.3 kg.  General appearance: alert, cooperative and appears stated age Head: Normocephalic, without obvious abnormality Neck: no JVD Resp: clear to auscultation bilaterally Cardio: regular rate and rhythm, S1, S2 normal, no murmur, click, rub or gallop GI: soft, non-tender; bowel sounds normal; no masses,  no organomegaly Extremities: catching left ring finger Pulses: 2+ and symmetric Skin: Skin color, texture, turgor normal. No rashes or lesions Neurologic: Grossly normal Incision/Wound: na  Assessment/Plan Assessment:  1. Trigger ring finger of left hand  2.  Contracture of palmar fascia    Plan: We have discussed release of the A1 pulley would recommend a fasciectomy of the cord to the ring finger with release of the cord proximally to the small finger. Pre-peri-and postoperative course are discussed along with risks and complications. She is aware there is no guarantee to the surgery the possibility of infection recurrence injury to arteries nerves tendons complete relief symptoms and dystrophy. This scheduled as an outpatient under regional anesthesia left ring finger   Alyssa Ford 08/01/2018, 5:37 AM

## 2018-08-01 NOTE — Op Note (Signed)
NAME: Alyssa Ford MEDICAL RECORD NO: [id number] DATE OF BIRTH: 12/16/1950 FAC[facility] LOCATION: [facility] PHYSICIAN: Wynonia Sours, MD   OPERATIVE REPORT   DATE OF PROCEDURE: 08/01/18    PREOPERATIVE DIAGNOSIS:   Stenosing tenosynovitis left ring finger with Dupuytren's cord   POSTOPERATIVE DIAGNOSIS:   Same   PROCEDURE:   Excision Dupuytren's cord with fasciectomy and release A1 pulley left ring finger   SURGEON: Daryll Brod, M.D.   ASSISTANT: none   ANESTHESIA:  Bier block with sedation and Local   INTRAVENOUS FLUIDS:  Per anesthesia flow sheet.   ESTIMATED BLOOD LOSS:  Minimal.   COMPLICATIONS:  None.   SPECIMENS:   Palmar fascia cord   TOURNIQUET TIME:    Total Tourniquet Time Documented: Upper Arm (Left) - 27 minutes Total: Upper Arm (Left) - 27 minutes    DISPOSITION:  Stable to PACU.   INDICATIONS: The patient is a 68 year old female with a history of triggering of her left ring finger which has not responded to conservative treatment.  She has a Dupuytren's cord going to the area.  She has elected to undergo release of the A1 pulley and reck recommended excision of the portion of the palmar fascia proximal.  She is aware that there is no guarantee to the surgery the possibility of infection recurrence injury to arteries nerves tendons and complete release symptoms dystrophy.  Pre-peri-and postoperative course been discussed along with risks and complications.  In preoperative area the patient is seen extremity marked by both patient and surgeon antibiotic given  OPERATIVE COURSE: Patient is brought to the operating room where an upper arm IV regional anesthetic was carried out without difficulty under the direction the anesthesia department.  She was prepped using ChloraPrep in the supine position with a left arm free.  A three-minute dry time was allowed and a timeout taken to confirm patient procedure.  A volar Bruner incision was made  over the cord to the level of the A1 pulley of the left ring finger.  This carried down through subcutaneous tissue.  Bleeders were elective electrocauterized necessary with bipolar.  A palmar fascial cord was immediately encountered proximally.  This was isolated neurovascular bundles protected and the cord incised distally to the level of the A1 pulley the A1 pulley was found to be markedly thickened approximately 4 mm in thickness.  This was released on its radial aspect.  This carried distally to the level of emergency to a normal-looking pulley at the A2.  This was release.  Tenosynovectomy performed proximally.  The finger placed through full passive range of motion.  No further trigger was noted.  The wound was copiously irrigated with saline.  The skin was closed and opted for nylon sutures.  Local infiltration quarter percent bupivacaine without epinephrine was given approximately 6 cc was used.  A sterile compressive dressing with the fingers 3 was applied and deflation of the tourniquet all fingers immediately pink.  She was taken to the recovery room for observation in satisfactory condition.  She will be discharged home to return to [facility] on ibuprofen Tylenol with Ultram as a backup.   Daryll Brod, MD Electronically signed, 08/01/18

## 2018-08-01 NOTE — Anesthesia Postprocedure Evaluation (Signed)
Anesthesia Post Note  Patient: Alyssa Ford  Procedure(s) Performed: FASCIECTOMY LEFT RING (Left Finger) RELEASE TRIGGER FINGER/A-1 PULLEY LEFTRING (Left Hand)     Patient location during evaluation: PACU Anesthesia Type: MAC and Bier Block Level of consciousness: awake and alert Pain management: pain level controlled Vital Signs Assessment: post-procedure vital signs reviewed and stable Respiratory status: spontaneous breathing, nonlabored ventilation, respiratory function stable and patient connected to nasal cannula oxygen Cardiovascular status: stable and blood pressure returned to baseline Postop Assessment: no apparent nausea or vomiting Anesthetic complications: no    Last Vitals:  Vitals:   08/01/18 1330 08/01/18 1345  BP: 132/87 (!) 138/59  Pulse: 79   Resp: 15   Temp:    SpO2: 97%     Last Pain:  Vitals:   08/01/18 1345  TempSrc:   PainSc: 0-No pain                 Barnet Glasgow

## 2018-08-01 NOTE — Discharge Instructions (Addendum)

## 2018-08-01 NOTE — Anesthesia Preprocedure Evaluation (Addendum)
Anesthesia Evaluation  Patient identified by MRN, date of birth, ID band Patient awake    Reviewed: Allergy & Precautions, NPO status , Patient's Chart, lab work & pertinent test results  Airway Mallampati: II  TM Distance: >3 FB Neck ROM: Full    Dental no notable dental hx. (+) Teeth Intact, Dental Advisory Given   Pulmonary neg pulmonary ROS, former smoker,    Pulmonary exam normal breath sounds clear to auscultation       Cardiovascular hypertension, negative cardio ROS Normal cardiovascular exam Rhythm:Regular Rate:Normal     Neuro/Psych PSYCHIATRIC DISORDERS Anxiety Depression negative neurological ROS     GI/Hepatic Neg liver ROS, hiatal hernia, GERD  Medicated and Controlled,  Endo/Other  negative endocrine ROSdiabetes, Type 2, Oral Hypoglycemic Agents  Renal/GU negative Renal ROS  negative genitourinary   Musculoskeletal  (+) Arthritis , Osteoarthritis,    Abdominal   Peds  Hematology negative hematology ROS (+)   Anesthesia Other Findings   Reproductive/Obstetrics                           Anesthesia Physical Anesthesia Plan  ASA: II  Anesthesia Plan: MAC and Bier Block and Bier Block-LIDOCAINE ONLY   Post-op Pain Management:    Induction: Intravenous  PONV Risk Score and Plan: 2 and Ondansetron and Dexamethasone  Airway Management Planned: Natural Airway  Additional Equipment:   Intra-op Plan:   Post-operative Plan:   Informed Consent: I have reviewed the patients History and Physical, chart, labs and discussed the procedure including the risks, benefits and alternatives for the proposed anesthesia with the patient or authorized representative who has indicated his/her understanding and acceptance.     Dental advisory given  Plan Discussed with: CRNA  Anesthesia Plan Comments:        Anesthesia Quick Evaluation

## 2018-08-02 ENCOUNTER — Encounter (HOSPITAL_BASED_OUTPATIENT_CLINIC_OR_DEPARTMENT_OTHER): Payer: Self-pay | Admitting: Orthopedic Surgery

## 2018-08-07 DIAGNOSIS — M65341 Trigger finger, right ring finger: Secondary | ICD-10-CM | POA: Diagnosis not present

## 2018-08-19 IMAGING — CT CT CHEST LUNG CANCER SCREENING LOW DOSE W/O CM
2 of 4 series · 15 of 36 positions shown, 18 images · non-contrast
Comparison: 04/21/2015 screening chest CT.

CLINICAL DATA: 65-year-old asymptomatic female former smoker with
66 pack-year smoking history, quit smoking in June 2015.

EXAM:
CT CHEST WITHOUT CONTRAST LOW-DOSE FOR LUNG CANCER SCREENING
TECHNIQUE: Multidetector CT imaging of the chest was performed following the
standard protocol without IV contrast.

[Series 3: lung thins 1.0 · axial · 0.66mm/px · z∈[+1624,+1897]mm · 12 of 350 slices shown, 15 images]
[im 16/350  mediastinal]
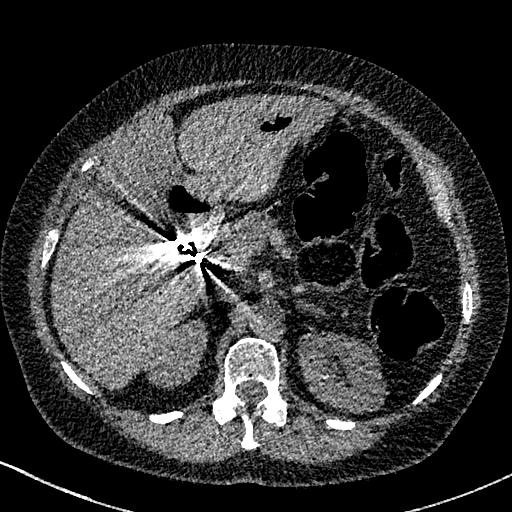
[im 16/350  lung]
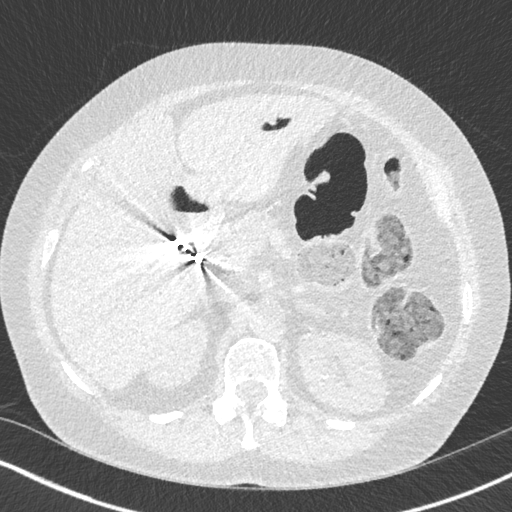
[im 46/350  lung]
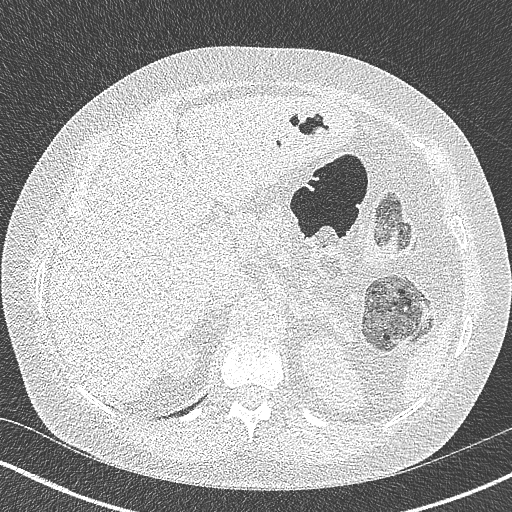
[im 76/350  lung]
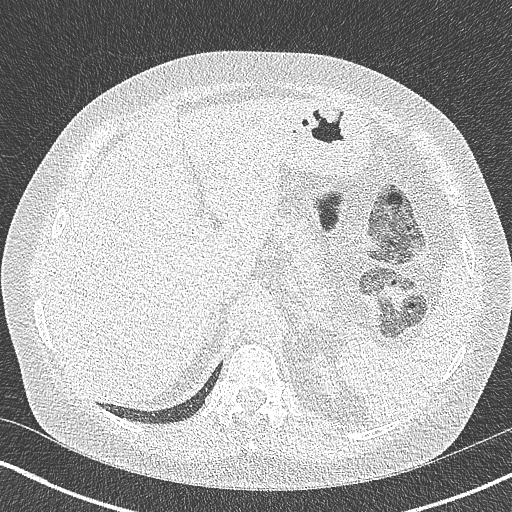
[im 107/350  lung]
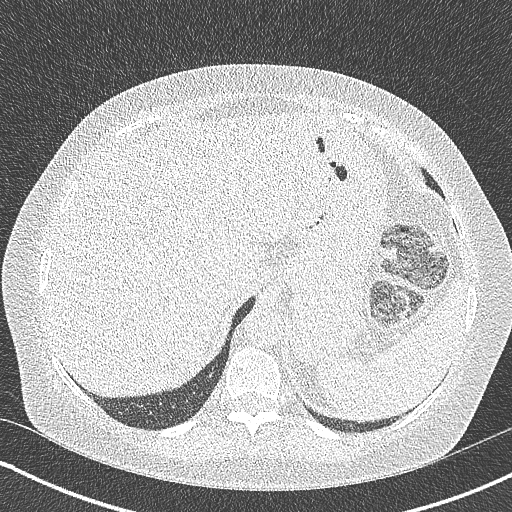
[im 137/350  mediastinal]
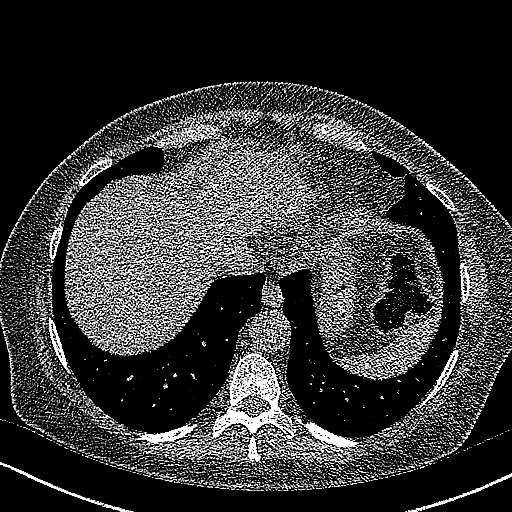
[im 137/350  lung]
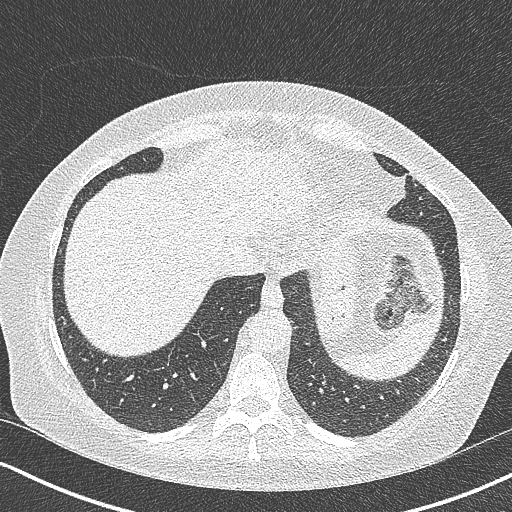
[im 167/350  lung]
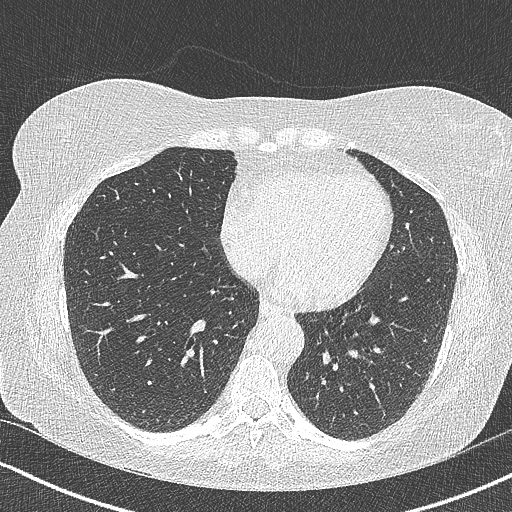
[im 183/350  lung]
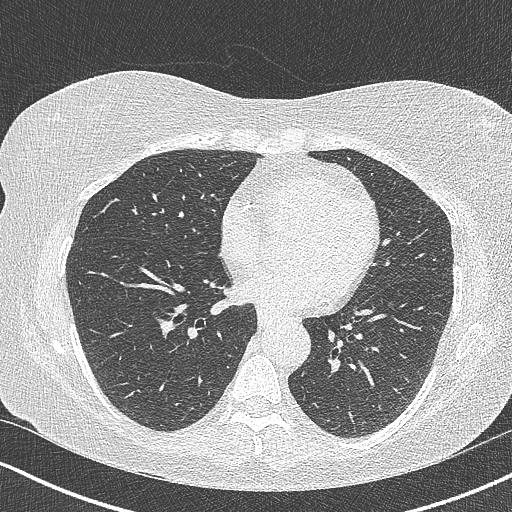
[im 213/350  lung]
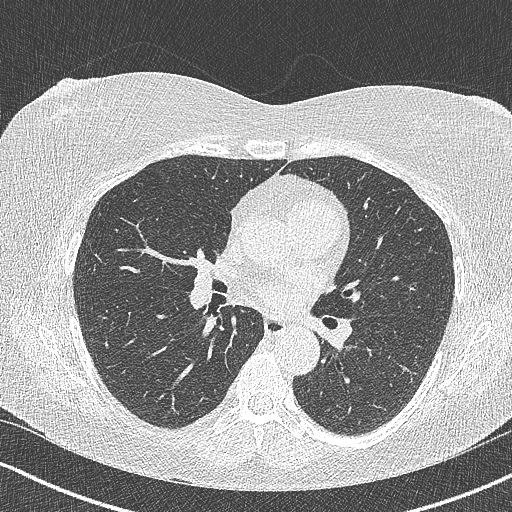
[im 243/350  mediastinal]
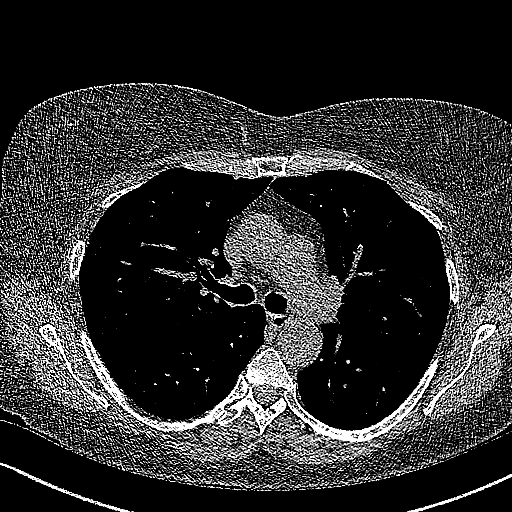
[im 243/350  lung]
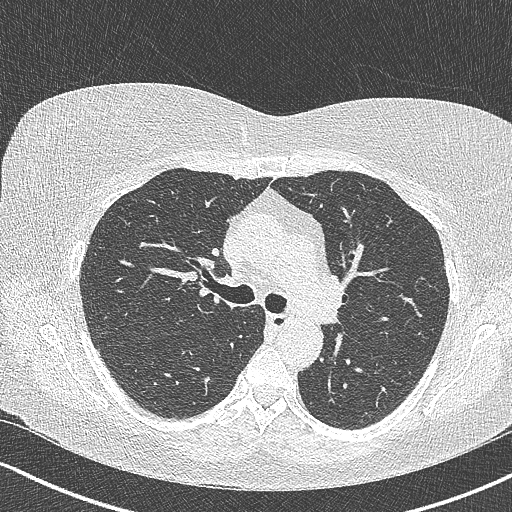
[im 274/350  lung]
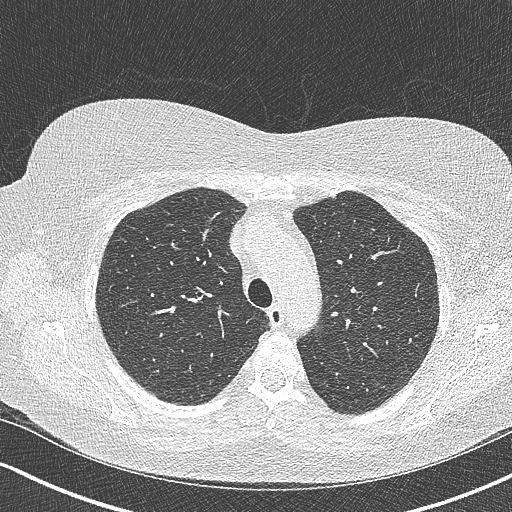
[im 304/350  lung]
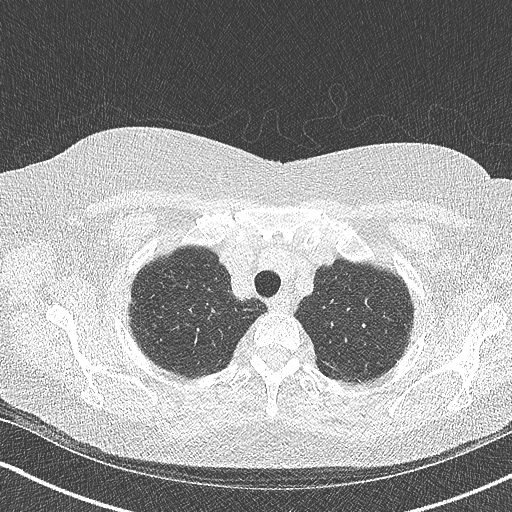
[im 334/350  lung]
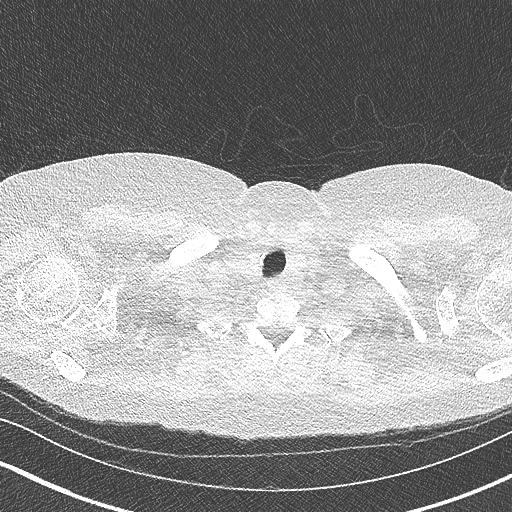

[Series 604: coronals · coronal · 0.66mm/px · 3 of 127 slices shown]
[im 26/127  lung]
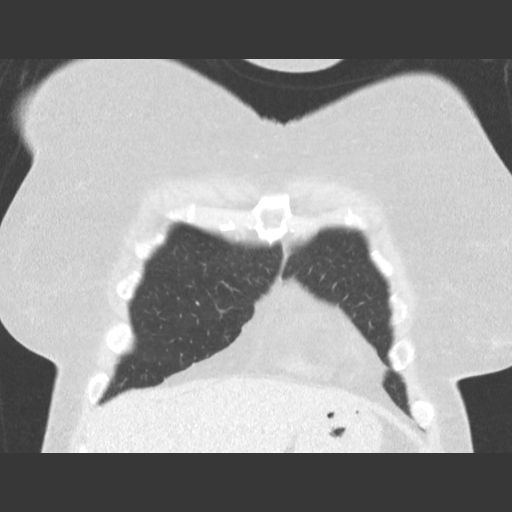
[im 51/127  lung]
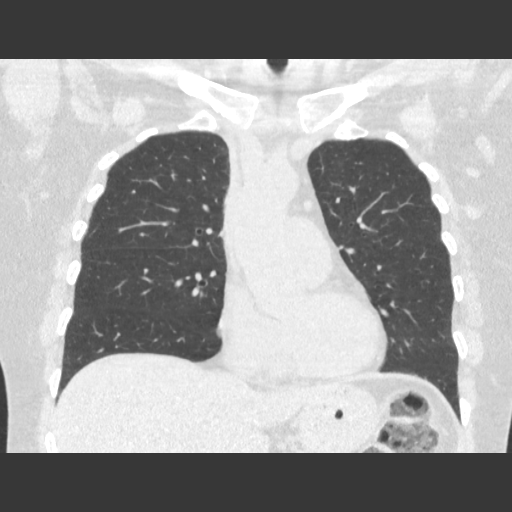
[im 76/127  lung]
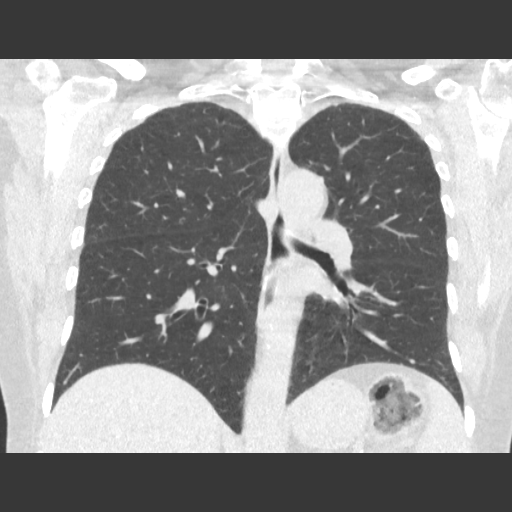

[15 of 36 positions shown; findings below may reference images not displayed]

FINDINGS: Cardiovascular: Normal heart size. No significant pericardial
fluid/thickening. Right coronary atherosclerosis. Atherosclerotic
nonaneurysmal thoracic aorta. Normal caliber pulmonary arteries.

Mediastinum/Nodes: Stable mixed cystic and solid and partially
calcified 3.1 cm nodule in the anterior upper mediastinum (series 2/
image 9), favored represent substernal extension of an exophytic
inferior left thyroid lobe nodule. Unremarkable esophagus. No
pathologically enlarged axillary, mediastinal or gross hilar lymph
nodes, noting limited sensitivity for the detection of hilar
adenopathy on this noncontrast study.

Lungs/Pleura: No pneumothorax. No pleural effusion. Minimal
centrilobular and paraseptal emphysema with mild diffuse bronchial
wall thickening. Scattered tiny pulmonary nodules measuring up to
3.4 mm in volume derived mean diameter in the right upper lobe
(series 3/ image 77) have not significantly changed. No new
significant pulmonary nodules.

Upper abdomen: Unremarkable.

Musculoskeletal: No aggressive appearing focal osseous lesions. Mild
thoracic spondylosis. Partially visualized surgical hardware from
[facility] in the lower cervical spine.
IMPRESSION: 1. Lung-RADS Category 2S, benign appearance or behavior. Continue
annual screening with low-dose chest CT without contrast in 12
months.
2. The "S" modifier above refers to potentially clinically
significant non lung cancer related findings. Specifically, 1 vessel
coronary atherosclerosis.
3. Aortic atherosclerosis .
4. Minimal emphysema with mild diffuse bronchial wall thickening,
suggesting COPD .
5. Stable mixed cystic and solid partially calcified 3.1 cm nodule
in the anterior upper mediastinum, favor substernal extension of an
exophytic inferior left thyroid lobe nodule.

## 2018-08-20 ENCOUNTER — Ambulatory Visit (INDEPENDENT_AMBULATORY_CARE_PROVIDER_SITE_OTHER): Payer: Medicare Other | Admitting: *Deleted

## 2018-08-20 DIAGNOSIS — E538 Deficiency of other specified B group vitamins: Secondary | ICD-10-CM | POA: Diagnosis not present

## 2018-08-20 MED ORDER — CYANOCOBALAMIN 1000 MCG/ML IJ SOLN
1000.0000 ug | Freq: Once | INTRAMUSCULAR | Status: AC
  Administered 2018-08-20: 1000 ug via INTRAMUSCULAR

## 2018-08-20 NOTE — Progress Notes (Signed)
Pt here for monthly B12 injection per Dr. Etter Sjogren  B12 1059mcg given IM in left deltoid and patientt tolerated injection well.  Next B12 injection scheduled for 09/18/18.

## 2018-08-30 ENCOUNTER — Other Ambulatory Visit: Payer: Self-pay | Admitting: Family Medicine

## 2018-09-02 ENCOUNTER — Other Ambulatory Visit: Payer: Self-pay | Admitting: Family Medicine

## 2018-09-02 DIAGNOSIS — F411 Generalized anxiety disorder: Secondary | ICD-10-CM

## 2018-09-02 MED ORDER — DIAZEPAM 5 MG PO TABS: ORAL_TABLET | ORAL | 0 refills | Status: DC

## 2018-09-02 MED ORDER — AMITRIPTYLINE HCL 25 MG PO TABS: 50.0000 mg | ORAL_TABLET | Freq: Every day | ORAL | 5 refills | Status: DC

## 2018-09-02 NOTE — Telephone Encounter (Signed)
Requesting: Diazepam  Contract: N/A UDS: 03/22/2018, low risk Last OV: 04/08/2018 Next OV: N/A Last Refill: 03/15/2018, #30--0 RF Database:   Please advise

## 2018-09-16 ENCOUNTER — Telehealth: Payer: Self-pay | Admitting: *Deleted

## 2018-09-16 NOTE — Telephone Encounter (Signed)
Received Medical/Surgical Clearance Form from [facility]; forwarded to provider/SLS 03/23

## 2018-09-18 ENCOUNTER — Ambulatory Visit (INDEPENDENT_AMBULATORY_CARE_PROVIDER_SITE_OTHER): Payer: Medicare Other

## 2018-09-18 ENCOUNTER — Other Ambulatory Visit: Payer: Self-pay

## 2018-09-18 DIAGNOSIS — E538 Deficiency of other specified B group vitamins: Secondary | ICD-10-CM

## 2018-09-18 MED ORDER — CYANOCOBALAMIN 1000 MCG/ML IJ SOLN
1000.0000 ug | Freq: Once | INTRAMUSCULAR | Status: AC
  Administered 2018-09-18: 1000 ug via INTRAMUSCULAR

## 2018-09-18 NOTE — Progress Notes (Addendum)
Pre visit review using our clinic tool,if applicable. No additional management support is needed unless otherwise documented below in the visit note.   Pt here for monthly B12 injection per order from Dr. Floy Sabina.  B12 1083mcg given IM, and pt tolerated injection well. No complaints voiced.  Next B12 injection scheduled for 1 month.  Signing off/reviewed b12 im injection approved by Dr Etter Sjogren.  Mackie Pai, PA-C

## 2018-09-30 ENCOUNTER — Telehealth: Payer: Self-pay | Admitting: Family Medicine

## 2018-09-30 NOTE — Telephone Encounter (Signed)
Pt. left message on Rx refill line to request refill on Atorvastatin; she requested to have the refill go to [facility] on [address].  Also, asking about postponing appt. for B12 injection, scheduled 4/24, until the [place] 19 pandemic is over.  Pt. Is asking if she can take B12 orally, until she can get her B12 injection rescheduled.   Will send request to provider for recommendation.

## 2018-09-30 NOTE — Telephone Encounter (Signed)
Yes--- she can take otc b12 -- sublingual --- -she needs to make sure she gets that one that dissolves under the tongue --- it is absorbed better

## 2018-10-01 NOTE — Telephone Encounter (Signed)
Patient notified to get otc b12, nurse appt canceled.  Patient will do doxy.me for 4/30 appt.  Also patient notified that we can do acute virtual visits.

## 2018-10-03 ENCOUNTER — Other Ambulatory Visit: Payer: Self-pay | Admitting: Family Medicine

## 2018-10-03 MED ORDER — ATORVASTATIN CALCIUM 80 MG PO TABS: 80.0000 mg | ORAL_TABLET | Freq: Every day | ORAL | 1 refills | Status: DC

## 2018-10-03 NOTE — Telephone Encounter (Signed)
Requested Prescriptions  Pending Prescriptions Disp Refills  . atorvastatin (LIPITOR) 80 MG tablet 90 tablet 1    Sig: Take 1 tablet (80 mg total) by mouth daily.     Cardiovascular:  Antilipid - Statins Passed - 10/03/2018 10:48 AM      Passed - Total Cholesterol in normal range and within 360 days    Cholesterol  Date Value Ref Range Status  07/17/2018 129 0 - 200 mg/dL Final    Comment:    ATP III Classification       Desirable:  < 200 mg/dL               Borderline High:  200 - 239 mg/dL          High:  > = 240 mg/dL         Passed - LDL in normal range and within 360 days    LDL Cholesterol  Date Value Ref Range Status  07/17/2018 67 0 - 99 mg/dL Final         Passed - HDL in normal range and within 360 days    HDL  Date Value Ref Range Status  07/17/2018 44.10 >39.00 mg/dL Final         Passed - Triglycerides in normal range and within 360 days    Triglycerides  Date Value Ref Range Status  07/17/2018 88.0 0.0 - 149.0 mg/dL Final    Comment:    Normal:  <150 mg/dLBorderline High:  150 - 199 mg/dL   Triglyceride fasting, serum  Date Value Ref Range Status  04/24/2006 216 (HH) 0 - 149 mg/dL Final    Comment:    See lab report for associated comment(s)         Passed - Patient is not pregnant      Passed - Valid encounter within last 12 months    Recent Outpatient Visits          5 months ago Preventative health care   [facility] at [place], [place]   1 year ago Frequency of urination   [facility] at [place], DO   1 year ago Preventative health care   [place] [facility] [place], DO   1 year ago Urinary tract infection without hematuria, site unspecified   [facility] at [facility], [place], DO   2 years ago Acute maxillary sinusitis, recurrence not specified   [facility] at [place], [employer]            In 3 weeks [place], DO [employer] [facility], [place]   In 6 months [place], [place] at [facility], [facility]

## 2018-10-18 ENCOUNTER — Ambulatory Visit: Payer: Medicare Other

## 2018-10-24 ENCOUNTER — Encounter: Payer: Self-pay | Admitting: Family Medicine

## 2018-10-24 ENCOUNTER — Other Ambulatory Visit: Payer: Self-pay

## 2018-10-24 ENCOUNTER — Ambulatory Visit (INDEPENDENT_AMBULATORY_CARE_PROVIDER_SITE_OTHER): Payer: Medicare Other | Admitting: Family Medicine

## 2018-10-24 DIAGNOSIS — D509 Iron deficiency anemia, unspecified: Secondary | ICD-10-CM | POA: Diagnosis not present

## 2018-10-24 DIAGNOSIS — E785 Hyperlipidemia, unspecified: Secondary | ICD-10-CM | POA: Diagnosis not present

## 2018-10-24 DIAGNOSIS — E1169 Type 2 diabetes mellitus with other specified complication: Secondary | ICD-10-CM | POA: Diagnosis not present

## 2018-10-24 DIAGNOSIS — E538 Deficiency of other specified B group vitamins: Secondary | ICD-10-CM

## 2018-10-24 NOTE — Progress Notes (Signed)
.  Virtual Visit via Video Note  I connected with Alyssa Ford on 10/24/18 at  8:15 AM EDT by a video enabled telemedicine application and verified that I am speaking with the correct person using two identifiers.  Location: Patient: home Provider: home    I discussed the limitations of evaluation and management by telemedicine and the availability of in person appointments. The patient expressed understanding and agreed to proceed.  History of Present Illness: Pt is home and need f/u chol and b12.  She just started SL b12 HYPERTENSION   Blood pressure range-pt states its been good  Chest pain- no      Dyspnea- no Lightheadedness- no   Edema- no  Other side effects - no   Medication compliance: good Low salt diet- yes    DIABETES    Blood Sugar ranges-120----- a1c 6.5 with endo   Polyuria- no New Visual problems- no  Hypoglycemic symptoms- no  Other side effects-no Medication compliance - good   HYPERLIPIDEMIA  Medication compliance- good RUQ pain- no  Muscle aches- no Other side effects-no   Review of Systems  Constitutional: Negative for activity change, appetite change and fatigue.  HENT: Negative for hearing loss, congestion, tinnitus and ear discharge.  dentist q74m Eyes: Negative for visual disturbance (see optho q1y -- vision corrected to 20/20 with glasses).  Respiratory: Negative for cough, chest tightness and shortness of breath.   Cardiovascular: Negative for chest pain, palpitations and leg swelling.  Gastrointestinal: Negative for abdominal pain, diarrhea, constipation and abdominal distention.  Genitourinary: Negative for urgency, frequency, decreased urine volume and difficulty urinating.  Musculoskeletal: Negative for back pain, arthralgias and gait problem.  Skin: Negative for color change, pallor and rash.  Neurological: Negative for dizziness, light-headedness, numbness and headaches.  Hematological: Negative for adenopathy. Does not bruise/bleed  easily.  Psychiatric/Behavioral: Negative for suicidal ideas, confusion, sleep disturbance, self-injury, dysphoric mood, decreased concentration and agitation.        Observations/Objective: Afebrile,  Pt unable to get vitals prior to visit   Pt in NAD and has no complains  Assessment and Plan: 1. B12 deficiency con't SL b12 - Vitamin B12; Future - CBC with Differential/Platelet; Future - IBC + Ferritin; Future  2. Iron deficiency anemia, unspecified iron deficiency anemia type Check labs  - CBC with Differential/Platelet; Future - IBC + Ferritin; Future  3. Hyperlipidemia associated with type 2 diabetes mellitus ([place]) Encouraged heart healthy diet, increase exercise, avoid trans fats, consider a krill oil cap daily - Lipid panel; Future - Comprehensive metabolic panel; Future Dm  Management per endo  Follow Up Instructions:    I discussed the assessment and treatment plan with the patient. The patient was provided an opportunity to ask questions and all were answered. The patient agreed with the plan and demonstrated an understanding of the instructions.   The patient was advised to call back or seek an in-person evaluation if the symptoms worsen or if the condition fails to improve as anticipated.  I provided 25 minutes of non-face-to-face time during this encounter.   Ann Held, DO

## 2018-10-25 ENCOUNTER — Other Ambulatory Visit: Payer: Self-pay

## 2018-10-25 ENCOUNTER — Telehealth: Payer: Self-pay | Admitting: Endocrinology

## 2018-10-25 DIAGNOSIS — IMO0002 Reserved for concepts with insufficient information to code with codable children: Secondary | ICD-10-CM

## 2018-10-25 DIAGNOSIS — E1151 Type 2 diabetes mellitus with diabetic peripheral angiopathy without gangrene: Secondary | ICD-10-CM

## 2018-10-25 DIAGNOSIS — E1165 Type 2 diabetes mellitus with hyperglycemia: Secondary | ICD-10-CM

## 2018-10-25 MED ORDER — METFORMIN HCL ER 500 MG PO TB24: ORAL_TABLET | ORAL | 11 refills | Status: DC

## 2018-10-25 MED ORDER — REPAGLINIDE 2 MG PO TABS: ORAL_TABLET | ORAL | 0 refills | Status: DC

## 2018-10-25 NOTE — Telephone Encounter (Signed)
E-Prescribing Status: Receipt confirmed by pharmacy (10/25/2018 1:58 PM EDT)

## 2018-10-25 NOTE — Telephone Encounter (Signed)
Great!

## 2018-10-25 NOTE — Telephone Encounter (Signed)
MEDICATION: Metformin, repaglinide,  PHARMACY:  [facility] on [address]  IS THIS A 90 DAY SUPPLY : y  IS PATIENT OUT OF MEDICATION: yes  IF NOT; HOW MUCH IS LEFT:   LAST APPOINTMENT DATE: @1 /27/2020  NEXT APPOINTMENT DATE:@7 /14/2020  DO WE HAVE YOUR PERMISSION TO LEAVE A DETAILED MESSAGE:  OTHER COMMENTS:  Pt did reach out to the pharmacy but they told her to call us  **Let patient know to contact pharmacy at the end of the day to make sure medication is ready. **  ** Please notify patient to allow 48-72 hours to process**  **Encourage patient to contact the pharmacy for refills or they can request refills through [facility]**

## 2018-10-25 NOTE — Telephone Encounter (Signed)
This was from yesterday and she had a visit earlier yesterday.  Do you want me to add this in her vitals?

## 2018-11-07 ENCOUNTER — Other Ambulatory Visit: Payer: Self-pay

## 2018-11-07 ENCOUNTER — Other Ambulatory Visit: Payer: Self-pay | Admitting: Family Medicine

## 2018-11-07 ENCOUNTER — Other Ambulatory Visit (INDEPENDENT_AMBULATORY_CARE_PROVIDER_SITE_OTHER): Payer: Medicare Other

## 2018-11-07 DIAGNOSIS — E1169 Type 2 diabetes mellitus with other specified complication: Secondary | ICD-10-CM | POA: Diagnosis not present

## 2018-11-07 DIAGNOSIS — E538 Deficiency of other specified B group vitamins: Secondary | ICD-10-CM

## 2018-11-07 DIAGNOSIS — E785 Hyperlipidemia, unspecified: Secondary | ICD-10-CM

## 2018-11-07 DIAGNOSIS — D509 Iron deficiency anemia, unspecified: Secondary | ICD-10-CM

## 2018-11-07 LAB — CBC WITH DIFFERENTIAL/PLATELET
Basophils Absolute: 0.1 10*3/uL (ref 0.0–0.1)
Basophils Relative: 0.7 % (ref 0.0–3.0)
Eosinophils Absolute: 0.2 10*3/uL (ref 0.0–0.7)
Eosinophils Relative: 2.4 % (ref 0.0–5.0)
HCT: 39 % (ref 36.0–46.0)
Hemoglobin: 13.3 g/dL (ref 12.0–15.0)
Lymphocytes Relative: 36.2 % (ref 12.0–46.0)
Lymphs Abs: 2.8 10*3/uL (ref 0.7–4.0)
MCHC: 34 g/dL (ref 30.0–36.0)
MCV: 90.6 fl (ref 78.0–100.0)
Monocytes Absolute: 0.6 10*3/uL (ref 0.1–1.0)
Monocytes Relative: 7.2 % (ref 3.0–12.0)
Neutro Abs: 4.2 10*3/uL (ref 1.4–7.7)
Neutrophils Relative %: 53.5 % (ref 43.0–77.0)
Platelets: 265 10*3/uL (ref 150.0–400.0)
RBC: 4.31 Mil/uL (ref 3.87–5.11)
RDW: 14.3 % (ref 11.5–15.5)
WBC: 7.8 10*3/uL (ref 4.0–10.5)

## 2018-11-07 LAB — IBC + FERRITIN
Ferritin: 34 ng/mL (ref 10.0–291.0)
Iron: 92 ug/dL (ref 42–145)
Saturation Ratios: 24.2 % (ref 20.0–50.0)
Transferrin: 272 mg/dL (ref 212.0–360.0)

## 2018-11-07 LAB — COMPREHENSIVE METABOLIC PANEL
ALT: 23 U/L (ref 0–35)
AST: 17 U/L (ref 0–37)
Albumin: 4.4 g/dL (ref 3.5–5.2)
Alkaline Phosphatase: 102 U/L (ref 39–117)
BUN: 16 mg/dL (ref 6–23)
CO2: 31 mEq/L (ref 19–32)
Calcium: 9.6 mg/dL (ref 8.4–10.5)
Chloride: 97 mEq/L (ref 96–112)
Creatinine, Ser: 0.84 mg/dL (ref 0.40–1.20)
GFR: 67.3 mL/min (ref >60.00)
Glucose, Bld: 169 mg/dL — ABNORMAL HIGH (ref 70–99)
Potassium: 4 mEq/L (ref 3.5–5.1)
Sodium: 139 mEq/L (ref 135–145)
Total Bilirubin: 0.5 mg/dL (ref 0.2–1.2)
Total Protein: 6.8 g/dL (ref 6.0–8.3)

## 2018-11-07 LAB — LIPID PANEL
Cholesterol: 164 mg/dL (ref 0–200)
HDL: 45.9 mg/dL (ref >39.00)
LDL Cholesterol: 89 mg/dL (ref 0–99)
NonHDL: 117.89
Total CHOL/HDL Ratio: 4
Triglycerides: 143 mg/dL (ref 0.0–149.0)
VLDL: 28.6 mg/dL (ref 0.0–40.0)

## 2018-11-07 LAB — VITAMIN B12: Vitamin B-12: 1114 pg/mL — ABNORMAL HIGH (ref 211–911)

## 2018-11-11 MED ORDER — ROSUVASTATIN CALCIUM 40 MG PO TABS: 40.0000 mg | ORAL_TABLET | Freq: Every day | ORAL | 2 refills | Status: DC

## 2018-11-11 NOTE — Addendum Note (Signed)
Addended byDamita Dunnings D on: 11/11/2018 12:01 PM   Modules accepted: Orders

## 2018-11-15 ENCOUNTER — Ambulatory Visit: Payer: Medicare Other

## 2018-12-09 ENCOUNTER — Other Ambulatory Visit: Payer: Self-pay | Admitting: Ophthalmology

## 2018-12-09 DIAGNOSIS — H02831 Dermatochalasis of right upper eyelid: Secondary | ICD-10-CM | POA: Diagnosis not present

## 2018-12-09 DIAGNOSIS — D485 Neoplasm of uncertain behavior of skin: Secondary | ICD-10-CM | POA: Diagnosis not present

## 2018-12-09 DIAGNOSIS — H57813 Brow ptosis, bilateral: Secondary | ICD-10-CM | POA: Diagnosis not present

## 2018-12-09 DIAGNOSIS — H53453 Other localized visual field defect, bilateral: Secondary | ICD-10-CM | POA: Diagnosis not present

## 2018-12-09 DIAGNOSIS — H02413 Mechanical ptosis of bilateral eyelids: Secondary | ICD-10-CM | POA: Diagnosis not present

## 2018-12-09 DIAGNOSIS — D23112 Other benign neoplasm of skin of right lower eyelid, including canthus: Secondary | ICD-10-CM | POA: Diagnosis not present

## 2018-12-09 DIAGNOSIS — L821 Other seborrheic keratosis: Secondary | ICD-10-CM | POA: Diagnosis not present

## 2018-12-09 DIAGNOSIS — H02834 Dermatochalasis of left upper eyelid: Secondary | ICD-10-CM | POA: Diagnosis not present

## 2018-12-18 ENCOUNTER — Other Ambulatory Visit: Payer: Self-pay | Admitting: Family Medicine

## 2018-12-18 DIAGNOSIS — F329 Major depressive disorder, single episode, unspecified: Secondary | ICD-10-CM

## 2018-12-18 DIAGNOSIS — F32A Depression, unspecified: Secondary | ICD-10-CM

## 2018-12-18 DIAGNOSIS — K219 Gastro-esophageal reflux disease without esophagitis: Secondary | ICD-10-CM

## 2018-12-30 DIAGNOSIS — Z961 Presence of intraocular lens: Secondary | ICD-10-CM | POA: Diagnosis not present

## 2018-12-30 DIAGNOSIS — H401131 Primary open-angle glaucoma, bilateral, mild stage: Secondary | ICD-10-CM | POA: Diagnosis not present

## 2018-12-30 DIAGNOSIS — H02831 Dermatochalasis of right upper eyelid: Secondary | ICD-10-CM | POA: Diagnosis not present

## 2018-12-30 DIAGNOSIS — E119 Type 2 diabetes mellitus without complications: Secondary | ICD-10-CM | POA: Diagnosis not present

## 2019-01-03 ENCOUNTER — Other Ambulatory Visit: Payer: Self-pay

## 2019-01-07 ENCOUNTER — Encounter: Payer: Self-pay | Admitting: Endocrinology

## 2019-01-07 ENCOUNTER — Ambulatory Visit: Payer: Medicare Other | Admitting: Endocrinology

## 2019-01-07 ENCOUNTER — Other Ambulatory Visit: Payer: Self-pay

## 2019-01-07 VITALS — BP 158/82 | HR 77 | Ht 66.0 in | Wt 162.6 lb

## 2019-01-07 DIAGNOSIS — E1151 Type 2 diabetes mellitus with diabetic peripheral angiopathy without gangrene: Secondary | ICD-10-CM | POA: Diagnosis not present

## 2019-01-07 DIAGNOSIS — E1165 Type 2 diabetes mellitus with hyperglycemia: Secondary | ICD-10-CM | POA: Diagnosis not present

## 2019-01-07 DIAGNOSIS — R221 Localized swelling, mass and lump, neck: Secondary | ICD-10-CM

## 2019-01-07 DIAGNOSIS — IMO0002 Reserved for concepts with insufficient information to code with codable children: Secondary | ICD-10-CM

## 2019-01-07 LAB — POCT GLYCOSYLATED HEMOGLOBIN (HGB A1C): Hemoglobin A1C: 7.3 % — AB (ref 4.0–5.6)

## 2019-01-07 MED ORDER — BROMOCRIPTINE MESYLATE 2.5 MG PO TABS: 2.5000 mg | ORAL_TABLET | Freq: Every day | ORAL | 3 refills | Status: DC

## 2019-01-07 NOTE — Progress Notes (Signed)
Subjective:    Patient ID: Alyssa Ford, female    DOB: Nov 09, 1950, 68 y.o.   MRN: [id number]  HPI Pt returns for f/u of diabetes mellitus: DM type: 2 Dx'ed: 9163 Complications: polyneuropathy and CAD (seen on CT).   Therapy: 3 oral meds GDM: never DKA: never Severe hypoglycemia: never Pancreatitis: never Pancreatic imaging: never Other: she has never been on insulin; she wants generic meds, if possible.   Interval history: Pt states cbg's are well-controlled.  She takes meds as rx'ed.  pt states she feels well in general.   Pt also has MNG ([place] in 2020 slowed Left lower pole nodule 1 has slightly enlarged compared with greater than 10 years ago. It meets criteria for annual follow-up). Also, There is a nonspecific mass inferior to the left lobe of the thyroid gland measuring 2.0 x 1.8 x 1.7 cm. Malignancy is not excluded. CT neck with contrast is recommended.  She has not seen ENT yet.  She denies neck pain and swelling Past Medical History:  Diagnosis Date  . Anemia   . Anxiety   . Arthritis   . Cataract   . Chronic headaches   . Depression   . Diabetes mellitus   . Diverticulosis of colon (without mention of hemorrhage) 2003, 2008   Colonoscopy  . GERD (gastroesophageal reflux disease)   . Glaucoma   . Hiatal hernia 2003   EGD   . Hyperlipidemia   . Hypertension   . Thyroid nodule     Past Surgical History:  Procedure Laterality Date  . CARPAL TUNNEL RELEASE  2006  . CARPAL TUNNEL RELEASE  12/24/2007  . CATARACT EXTRACTION, BILATERAL     [place]---  sept and oct 2018  . CERVICAL FUSION  3/08  . CHOLECYSTECTOMY    . FASCIECTOMY Left 08/01/2018   Procedure: FASCIECTOMY LEFT RING;  Surgeon: Daryll Brod, MD;  Location: [facility];  Service: Orthopedics;  Laterality: Left;  UPPER ARM  . TRIGGER FINGER RELEASE Left 08/01/2018   Procedure: RELEASE TRIGGER FINGER/A-1 PULLEY LEFTRING;  Surgeon: Daryll Brod, MD;  Location: [facility];  Service:  Orthopedics;  Laterality: Left;    Social History   Socioeconomic History  . Marital status: Married    Spouse name: Not on file  . Number of children: Not on file  . Years of education: Not on file  . Highest education level: Not on file  Occupational History  . Occupation: retired  Scientific laboratory technician  . Financial resource strain: Not on file  . Food insecurity    Worry: Not on file    Inability: Not on file  . Transportation needs    Medical: Not on file    Non-medical: Not on file  Tobacco Use  . Smoking status: Former Smoker    Packs/day: 1.40    Years: 49.00    Pack years: 68.60    Types: Cigarettes    Quit date: 07/16/2015    Years since quitting: 3.4  . Smokeless tobacco: Never Used  . Tobacco comment: e cig  Substance and Sexual Activity  . Alcohol use: Yes    Alcohol/week: 0.0 standard drinks    Comment: once weekly; wine and beer  . Drug use: No  . Sexual activity: Yes    Partners: Male  Lifestyle  . Physical activity    Days per week: Not on file    Minutes per session: Not on file  . Stress: Not on file  Relationships  .  Social Herbalist on phone: Not on file    Gets together: Not on file    Attends religious service: Not on file    Active member of club or organization: Not on file    Attends meetings of clubs or organizations: Not on file    Relationship status: Not on file  . Intimate partner violence    Fear of current or ex partner: Not on file    Emotionally abused: Not on file    Physically abused: Not on file    Forced sexual activity: Not on file  Other Topics Concern  . Not on file  Social History Narrative   Exercise--  walk    Current Outpatient Medications on File Prior to Visit  Medication Sig Dispense Refill  . amitriptyline (ELAVIL) 25 MG tablet Take 2 tablets (50 mg total) by mouth daily. 60 tablet 5  . aspirin EC 81 MG tablet Take 81 mg by mouth daily.    Marland Kitchen BIOTIN PO Take 1 tablet by mouth daily.     Marland Kitchen buPROPion  (WELLBUTRIN XL) 150 MG 24 hr tablet TAKE 2 TABLETS BY MOUTH ONCE EVERY MORNING 180 tablet 1  . Coenzyme Q10 (CO Q 10 PO) Take 1 capsule by mouth daily.    . diazepam (VALIUM) 5 MG tablet 1 po qhs prn 30 tablet 0  . Ferrous Sulfate (IRON SUPPLEMENT PO) Take 2 tablets by mouth daily.     . hydrochlorothiazide (HYDRODIURIL) 25 MG tablet TAKE 1 TABLET BY MOUTH ONCE DAILY 90 tablet 1  . latanoprost (XALATAN) 0.005 % ophthalmic solution Place 1 drop into both eyes at bedtime.     . metFORMIN (GLUCOPHAGE-XR) 500 MG 24 hr tablet Take 4 tablets (2000 mg) daily with breakfast. 120 tablet 11  . Naproxen Sodium (ALEVE PO) Take 2 tablets by mouth daily.    Marland Kitchen omeprazole (PRILOSEC) 40 MG capsule TAKE 1 CAPSULE BY MOUTH ONCE DAILY 90 capsule 1  . repaglinide (PRANDIN) 2 MG tablet TAKE 1 TABLET BY MOUTH THREE TIMES DAILY BEFORE  MEALS 270 tablet 0  . rosuvastatin (CRESTOR) 40 MG tablet Take 1 tablet (40 mg total) by mouth at bedtime. 30 tablet 2  . traMADol (ULTRAM) 50 MG tablet Take 1 tablet (50 mg total) by mouth every 6 (six) hours as needed. 20 tablet 0   Current Facility-Administered Medications on File Prior to Visit  Medication Dose Route Frequency Provider Last Rate Last Dose  . 0.9 %  sodium chloride infusion  500 mL Intravenous Once Nandigam, Venia Minks, MD        Allergies  Allergen Reactions  . Codeine Nausea Only    Severe nausea    Family History  Problem Relation Age of Onset  . Dementia Mother   . Breast cancer Mother 59  . Kidney failure Mother 18       died from kidney failure from abx given to her for pneumonia  . Hyperlipidemia Mother   . Hypertension Mother   . Diabetes Mother   . Cancer Mother 73       breast  . Parkinsonism Father   . Diabetes Father 41  . Hypertension Sister   . Diabetes Brother   . Hypertension Brother   . Heart attack Brother   . Heart disease Cousin 91       mi  . Colon cancer Neg Hx     BP (!) 158/82 (BP Location: Right Arm, Patient Position:  Sitting, Cuff Size:  Normal)   Pulse 77   Ht 5\' 6"  (1.676 m)   Wt 162 lb 9.6 oz (73.8 kg)   SpO2 97%   BMI 26.24 kg/m   Review of Systems She denies hypoglycemia.  She has gained a few lbs.     Objective:   Physical Exam VITAL SIGNS:  See vs page GENERAL: no distress Pulses: dorsalis pedis intact bilat.   MSK: no deformity of the feet CV: no leg edema Skin:  no ulcer on the feet.  normal color and temp on the feet. Neuro: sensation is intact to touch on the feet Ext: left great toenail is partially resected.      Assessment & Plan:  Type 2 DM, with CAD: worse.  HTN: is noted today. Neck mass.  ENT eval needed.  Patient Instructions  Your blood pressure is high today.  Please see your primary care provider soon, to have it rechecked.  I have sent a prescription to your pharmacy, to double the bromocriptine.  Please see an ENT specialist.  you will receive a phone call, about a day and time for an appointment check your blood sugar once a day.  vary the time of day when you check, between before the 3 meals, and at bedtime.  also check if you have symptoms of your blood sugar being too high or too low.  please keep a record of the readings and bring it to your next appointment here (or you can bring the meter itself).  You can write it on any piece of paper.  please call us sooner if your blood sugar goes below 70, or if you have a lot of readings over 200. Please come back for a follow-up appointment in 3 months.

## 2019-01-07 NOTE — Progress Notes (Signed)
   Subjective:    Patient ID: Alyssa Ford, female    DOB: [birth year], 68 y.o.   MRN: [id number]  HPI    Review of Systems     Objective:   Physical Exam        Assessment & Plan:

## 2019-01-07 NOTE — Patient Instructions (Addendum)
Your blood pressure is high today.  Please see your primary care provider soon, to have it rechecked.  I have sent a prescription to your pharmacy, to double the bromocriptine.  Please see an ENT specialist.  you will receive a phone call, about a day and time for an appointment check your blood sugar once a day.  vary the time of day when you check, between before the 3 meals, and at bedtime.  also check if you have symptoms of your blood sugar being too high or too low.  please keep a record of the readings and bring it to your next appointment here (or you can bring the meter itself).  You can write it on any piece of paper.  please call us sooner if your blood sugar goes below 70, or if you have a lot of readings over 200. Please come back for a follow-up appointment in 3 months.

## 2019-01-17 ENCOUNTER — Other Ambulatory Visit: Payer: Self-pay | Admitting: Endocrinology

## 2019-01-17 DIAGNOSIS — E1165 Type 2 diabetes mellitus with hyperglycemia: Secondary | ICD-10-CM

## 2019-01-17 DIAGNOSIS — E1151 Type 2 diabetes mellitus with diabetic peripheral angiopathy without gangrene: Secondary | ICD-10-CM

## 2019-01-17 DIAGNOSIS — IMO0002 Reserved for concepts with insufficient information to code with codable children: Secondary | ICD-10-CM

## 2019-01-21 ENCOUNTER — Other Ambulatory Visit: Payer: Self-pay | Admitting: *Deleted

## 2019-01-21 MED ORDER — ROSUVASTATIN CALCIUM 40 MG PO TABS: 40.0000 mg | ORAL_TABLET | Freq: Every day | ORAL | 0 refills | Status: DC

## 2019-01-24 ENCOUNTER — Other Ambulatory Visit: Payer: Self-pay

## 2019-01-27 ENCOUNTER — Other Ambulatory Visit: Payer: Self-pay

## 2019-01-27 ENCOUNTER — Encounter: Payer: Self-pay | Admitting: Family Medicine

## 2019-01-27 ENCOUNTER — Ambulatory Visit (INDEPENDENT_AMBULATORY_CARE_PROVIDER_SITE_OTHER): Payer: Medicare Other | Admitting: Family Medicine

## 2019-01-27 VITALS — BP 145/76 | HR 77 | Temp 98.5°F | Resp 18 | Ht 66.0 in | Wt 159.8 lb

## 2019-01-27 DIAGNOSIS — E1169 Type 2 diabetes mellitus with other specified complication: Secondary | ICD-10-CM

## 2019-01-27 DIAGNOSIS — I10 Essential (primary) hypertension: Secondary | ICD-10-CM

## 2019-01-27 DIAGNOSIS — E785 Hyperlipidemia, unspecified: Secondary | ICD-10-CM | POA: Diagnosis not present

## 2019-01-27 LAB — COMPREHENSIVE METABOLIC PANEL
ALT: 18 U/L (ref 0–35)
AST: 13 U/L (ref 0–37)
Albumin: 4.6 g/dL (ref 3.5–5.2)
Alkaline Phosphatase: 84 U/L (ref 39–117)
BUN: 16 mg/dL (ref 6–23)
CO2: 31 mEq/L (ref 19–32)
Calcium: 10.2 mg/dL (ref 8.4–10.5)
Chloride: 97 mEq/L (ref 96–112)
Creatinine, Ser: 0.8 mg/dL (ref 0.40–1.20)
GFR: 71.15 mL/min (ref >60.00)
Glucose, Bld: 175 mg/dL — ABNORMAL HIGH (ref 70–99)
Potassium: 3.7 mEq/L (ref 3.5–5.1)
Sodium: 137 mEq/L (ref 135–145)
Total Bilirubin: 0.3 mg/dL (ref 0.2–1.2)
Total Protein: 7 g/dL (ref 6.0–8.3)

## 2019-01-27 LAB — LIPID PANEL
Cholesterol: 128 mg/dL (ref 0–200)
HDL: 49.3 mg/dL (ref >39.00)
LDL Cholesterol: 57 mg/dL (ref 0–99)
NonHDL: 78.24
Total CHOL/HDL Ratio: 3
Triglycerides: 105 mg/dL (ref 0.0–149.0)
VLDL: 21 mg/dL (ref 0.0–40.0)

## 2019-01-27 MED ORDER — LISINOPRIL 10 MG PO TABS: 10.0000 mg | ORAL_TABLET | Freq: Every day | ORAL | 3 refills | Status: DC

## 2019-01-27 NOTE — Assessment & Plan Note (Signed)
Tolerating statin, encouraged heart healthy diet, avoid trans fats, minimize simple carbs and saturated fats. Increase exercise as tolerated 

## 2019-01-27 NOTE — Assessment & Plan Note (Signed)
Poorly controlled will alter medications, encouraged DASH diet, minimize caffeine and obtain adequate sleep. Report concerning symptoms and follow up as directed and as needed 

## 2019-01-27 NOTE — Progress Notes (Signed)
Patient ID: Alyssa Ford, female    DOB: 12-06-1950  Age: 68 y.o. MRN: [id number]    Subjective:  Subjective  HPI Alyssa Ford presents for bp check.  Her bp has been running 130-140/ 80-90 at home and was 150/ 82 in endo office   Review of Systems  Constitutional: Negative for appetite change, diaphoresis, fatigue and unexpected weight change.  Eyes: Negative for pain, redness and visual disturbance.  Respiratory: Negative for cough, chest tightness, shortness of breath and wheezing.   Cardiovascular: Negative for chest pain, palpitations and leg swelling.  Endocrine: Negative for cold intolerance, heat intolerance, polydipsia, polyphagia and polyuria.  Genitourinary: Negative for difficulty urinating, dysuria and frequency.  Neurological: Negative for dizziness, light-headedness, numbness and headaches.    History Past Medical History:  Diagnosis Date   Anemia    Anxiety    Arthritis    Cataract    Chronic headaches    Depression    Diabetes mellitus    Diverticulosis of colon (without mention of hemorrhage) 2003, 2008   Colonoscopy   GERD (gastroesophageal reflux disease)    Glaucoma    Hiatal hernia 2003   EGD    Hyperlipidemia    Hypertension    Thyroid nodule     She has a past surgical history that includes Cholecystectomy; Carpal tunnel release (2006); Carpal tunnel release (12/24/2007); Cervical fusion (3/08); Cataract extraction, bilateral; Fasciectomy (Left, 08/01/2018); and Trigger finger release (Left, 08/01/2018).   Her family history includes Breast cancer (age of onset: 60) in her mother; Cancer (age of onset: 32) in her mother; Dementia in her mother; Diabetes in her brother and mother; Diabetes (age of onset: 27) in her father; Heart attack in her brother; Heart disease (age of onset: 7) in her cousin; Hyperlipidemia in her mother; Hypertension in her brother, mother, and sister; Kidney failure (age of onset: 54) in her mother; Parkinsonism in  her father.She reports that she quit smoking about 3 years ago. Her smoking use included cigarettes. She has a 68.60 pack-year smoking history. She has never used smokeless tobacco. She reports current alcohol use. She reports that she does not use drugs.  Current Outpatient Medications on File Prior to Visit  Medication Sig Dispense Refill   amitriptyline (ELAVIL) 25 MG tablet Take 2 tablets (50 mg total) by mouth daily. 60 tablet 5   aspirin EC 81 MG tablet Take 81 mg by mouth daily.     BIOTIN PO Take 1 tablet by mouth daily.      bromocriptine (PARLODEL) 2.5 MG tablet Take 1 tablet (2.5 mg total) by mouth daily. 90 tablet 3   buPROPion (WELLBUTRIN XL) 150 MG 24 hr tablet TAKE 2 TABLETS BY MOUTH ONCE EVERY MORNING 180 tablet 1   Coenzyme Q10 (CO Q 10 PO) Take 1 capsule by mouth daily.     diazepam (VALIUM) 5 MG tablet 1 po qhs prn 30 tablet 0   Ferrous Sulfate (IRON SUPPLEMENT PO) Take 2 tablets by mouth daily.      hydrochlorothiazide (HYDRODIURIL) 25 MG tablet TAKE 1 TABLET BY MOUTH ONCE DAILY 90 tablet 1   latanoprost (XALATAN) 0.005 % ophthalmic solution Place 1 drop into both eyes at bedtime.      metFORMIN (GLUCOPHAGE-XR) 500 MG 24 hr tablet Take 4 tablets (2000 mg) daily with breakfast. 120 tablet 11   Naproxen Sodium (ALEVE PO) Take 2 tablets by mouth daily.     omeprazole (PRILOSEC) 40 MG capsule TAKE 1 CAPSULE BY MOUTH ONCE  DAILY 90 capsule 1   repaglinide (PRANDIN) 2 MG tablet TAKE 1 TABLET BY MOUTH THREE TIMES DAILY BEFORE MEALS 270 tablet 0   rosuvastatin (CRESTOR) 40 MG tablet Take 1 tablet (40 mg total) by mouth at bedtime. 90 tablet 0   traMADol (ULTRAM) 50 MG tablet Take 1 tablet (50 mg total) by mouth every 6 (six) hours as needed. 20 tablet 0   Current Facility-Administered Medications on File Prior to Visit  Medication Dose Route Frequency Provider Last Rate Last Dose   0.9 %  sodium chloride infusion  500 mL Intravenous Once Nandigam, Venia Minks, MD           Objective:  Objective  Physical Exam Vitals signs and nursing note reviewed.  Constitutional:      Appearance: She is well-developed.  HENT:     Head: Normocephalic and atraumatic.  Eyes:     Conjunctiva/sclera: Conjunctivae normal.  Neck:     Musculoskeletal: Normal range of motion and neck supple.     Thyroid: No thyromegaly.     Vascular: No carotid bruit or JVD.  Cardiovascular:     Rate and Rhythm: Normal rate and regular rhythm.     Heart sounds: Normal heart sounds. No murmur.  Pulmonary:     Effort: Pulmonary effort is normal. No respiratory distress.     Breath sounds: Normal breath sounds. No wheezing or rales.  Chest:     Chest wall: No tenderness.  Neurological:     Mental Status: She is alert and oriented to person, place, and time.    BP (!) 145/76 (BP Location: Left Arm, Patient Position: Sitting, Cuff Size: Normal)    Pulse 77    Temp 98.5 F (36.9 C) (Oral)    Resp 18    Ht 5\' 6"  (1.676 m)    Wt 159 lb 12.8 oz (72.5 kg)    SpO2 99%    BMI 25.79 kg/m  Wt Readings from Last 3 Encounters:  01/27/19 159 lb 12.8 oz (72.5 kg)  01/07/19 162 lb 9.6 oz (73.8 kg)  08/01/18 153 lb 10.6 oz (69.7 kg)     Lab Results  Component Value Date   WBC 7.8 11/07/2018   HGB 13.3 11/07/2018   HCT 39.0 11/07/2018   PLT 265.0 11/07/2018   GLUCOSE 169 (H) 11/07/2018   CHOL 164 11/07/2018   TRIG 143.0 11/07/2018   HDL 45.90 11/07/2018   LDLDIRECT 103.0 04/08/2018   LDLCALC 89 11/07/2018   ALT 23 11/07/2018   AST 17 11/07/2018   NA 139 11/07/2018   K 4.0 11/07/2018   CL 97 11/07/2018   CREATININE 0.84 11/07/2018   BUN 16 11/07/2018   CO2 31 11/07/2018   TSH 4.23 07/08/2018   HGBA1C 7.3 (A) 01/07/2019   MICROALBUR 1.1 04/08/2018    No results found.   Assessment & Plan:  Plan  I am having Alyssa Ford start on lisinopril. I am also having her maintain her BIOTIN PO, Naproxen Sodium (ALEVE PO), latanoprost, Ferrous Sulfate (IRON SUPPLEMENT PO), aspirin  EC, Coenzyme Q10 (CO Q 10 PO), traMADol, diazepam, amitriptyline, metFORMIN, hydrochlorothiazide, buPROPion, omeprazole, bromocriptine, repaglinide, and rosuvastatin. We will continue to administer sodium chloride.  Meds ordered this encounter  Medications   lisinopril (ZESTRIL) 10 MG tablet    Sig: Take 1 tablet (10 mg total) by mouth daily.    Dispense:  30 tablet    Refill:  3    Problem List Items Addressed This Visit  Unprioritized   Essential hypertension - Primary    Poorly controlled will alter medications, encouraged DASH diet, minimize caffeine and obtain adequate sleep. Report concerning symptoms and follow up as directed and as needed      Relevant Medications   lisinopril (ZESTRIL) 10 MG tablet   Other Relevant Orders   Lipid panel   Comprehensive metabolic panel   Hyperlipidemia LDL goal <70    Tolerating statin, encouraged heart healthy diet, avoid trans fats, minimize simple carbs and saturated fats. Increase exercise as tolerated      Relevant Medications   lisinopril (ZESTRIL) 10 MG tablet    Other Visit Diagnoses    Hyperlipidemia associated with type 2 diabetes mellitus ([place])       Relevant Medications   lisinopril (ZESTRIL) 10 MG tablet   Other Relevant Orders   Lipid panel   Comprehensive metabolic panel      Follow-up: Return in about 2 weeks (around 02/10/2019), or if symptoms worsen or fail to improve.  Ann Held, DO

## 2019-01-27 NOTE — Patient Instructions (Signed)

## 2019-02-06 ENCOUNTER — Telehealth: Payer: Self-pay | Admitting: Endocrinology

## 2019-02-06 NOTE — Telephone Encounter (Signed)
Patient ph# [phone] called re: patient states Dr. Loanne Drilling had told patient he wanted her to see an [facility] but patient has not heard any more about it from our office. Please call patient at the ph# listed above to advise.

## 2019-02-10 ENCOUNTER — Ambulatory Visit (INDEPENDENT_AMBULATORY_CARE_PROVIDER_SITE_OTHER): Payer: Medicare Other | Admitting: Family Medicine

## 2019-02-10 ENCOUNTER — Other Ambulatory Visit: Payer: Self-pay

## 2019-02-10 ENCOUNTER — Encounter: Payer: Self-pay | Admitting: Family Medicine

## 2019-02-10 VITALS — BP 120/60 | HR 90 | Temp 97.8°F | Resp 18 | Ht 66.0 in | Wt 159.4 lb

## 2019-02-10 DIAGNOSIS — I10 Essential (primary) hypertension: Secondary | ICD-10-CM | POA: Diagnosis not present

## 2019-02-10 DIAGNOSIS — E041 Nontoxic single thyroid nodule: Secondary | ICD-10-CM | POA: Diagnosis not present

## 2019-02-10 DIAGNOSIS — E1151 Type 2 diabetes mellitus with diabetic peripheral angiopathy without gangrene: Secondary | ICD-10-CM

## 2019-02-10 DIAGNOSIS — E1165 Type 2 diabetes mellitus with hyperglycemia: Secondary | ICD-10-CM | POA: Diagnosis not present

## 2019-02-10 DIAGNOSIS — F411 Generalized anxiety disorder: Secondary | ICD-10-CM

## 2019-02-10 DIAGNOSIS — IMO0002 Reserved for concepts with insufficient information to code with codable children: Secondary | ICD-10-CM

## 2019-02-10 MED ORDER — DIAZEPAM 5 MG PO TABS: ORAL_TABLET | ORAL | 0 refills | Status: DC

## 2019-02-10 NOTE — Progress Notes (Signed)
Patient ID: Alyssa Ford, female    DOB: [birth year]  Age: 67 y.o. MRN: [id number]    Subjective:  Subjective  HPI Alyssa Ford presents for f/u bp and refill her diazepam.  Endo found a nodule on her thyroid and wanted her to see ent but she never heard anything about it.  She wanted to see ent in [place]. Review of Systems  Constitutional: Negative for activity change, appetite change, fatigue and unexpected weight change.  Respiratory: Negative for cough and shortness of breath.   Cardiovascular: Negative for chest pain and palpitations.  Psychiatric/Behavioral: Negative for behavioral problems and dysphoric mood. The patient is not nervous/anxious.     History Past Medical History:  Diagnosis Date  . Anemia   . Anxiety   . Arthritis   . Cataract   . Chronic headaches   . Depression   . Diabetes mellitus   . Diverticulosis of colon (without mention of hemorrhage) 2003, 2008   Colonoscopy  . GERD (gastroesophageal reflux disease)   . Glaucoma   . Hiatal hernia 2003   EGD   . Hyperlipidemia   . Hypertension   . Thyroid nodule     She has a past surgical history that includes Cholecystectomy; Carpal tunnel release (2006); Carpal tunnel release (12/24/2007); Cervical fusion (3/08); Cataract extraction, bilateral; Fasciectomy (Left, 08/01/2018); and Trigger finger release (Left, 08/01/2018).   Her family history includes Breast cancer (age of onset: 28) in her mother; Cancer (age of onset: 33) in her mother; Dementia in her mother; Diabetes in her brother and mother; Diabetes (age of onset: 17) in her father; Heart attack in her brother; Heart disease (age of onset: 57) in her cousin; Hyperlipidemia in her mother; Hypertension in her brother, mother, and sister; Kidney failure (age of onset: 7) in her mother; Parkinsonism in her father.She reports that she quit smoking about 3 years ago. Her smoking use included cigarettes. She has a 68.60 pack-year smoking history. She has never used  smokeless tobacco. She reports current alcohol use. She reports that she does not use drugs.  Current Outpatient Medications on File Prior to Visit  Medication Sig Dispense Refill  . amitriptyline (ELAVIL) 25 MG tablet Take 2 tablets (50 mg total) by mouth daily. 60 tablet 5  . aspirin EC 81 MG tablet Take 81 mg by mouth daily.    Marland Kitchen BIOTIN PO Take 1 tablet by mouth daily.     . bromocriptine (PARLODEL) 2.5 MG tablet Take 1 tablet (2.5 mg total) by mouth daily. 90 tablet 3  . buPROPion (WELLBUTRIN XL) 150 MG 24 hr tablet TAKE 2 TABLETS BY MOUTH ONCE EVERY MORNING 180 tablet 1  . Coenzyme Q10 (CO Q 10 PO) Take 1 capsule by mouth daily.    . Ferrous Sulfate (IRON SUPPLEMENT PO) Take 2 tablets by mouth daily.     . hydrochlorothiazide (HYDRODIURIL) 25 MG tablet TAKE 1 TABLET BY MOUTH ONCE DAILY 90 tablet 1  . latanoprost (XALATAN) 0.005 % ophthalmic solution Place 1 drop into both eyes at bedtime.     Marland Kitchen lisinopril (ZESTRIL) 10 MG tablet Take 1 tablet (10 mg total) by mouth daily. 30 tablet 3  . metFORMIN (GLUCOPHAGE-XR) 500 MG 24 hr tablet Take 4 tablets (2000 mg) daily with breakfast. 120 tablet 11  . Naproxen Sodium (ALEVE PO) Take 2 tablets by mouth daily.    Marland Kitchen omeprazole (PRILOSEC) 40 MG capsule TAKE 1 CAPSULE BY MOUTH ONCE DAILY 90 capsule 1  . repaglinide (PRANDIN) 2  MG tablet TAKE 1 TABLET BY MOUTH THREE TIMES DAILY BEFORE MEALS 270 tablet 0  . rosuvastatin (CRESTOR) 40 MG tablet Take 1 tablet (40 mg total) by mouth at bedtime. 90 tablet 0   Current Facility-Administered Medications on File Prior to Visit  Medication Dose Route Frequency Provider Last Rate Last Dose  . 0.9 %  sodium chloride infusion  500 mL Intravenous Once Nandigam, Venia Minks, MD         Objective:  Objective  Physical Exam Vitals signs and nursing note reviewed.  Constitutional:      Appearance: She is well-developed.  HENT:     Head: Normocephalic and atraumatic.  Eyes:     Conjunctiva/sclera: Conjunctivae  normal.  Neck:     Musculoskeletal: Normal range of motion and neck supple.     Thyroid: No thyromegaly.     Vascular: No carotid bruit or JVD.  Cardiovascular:     Rate and Rhythm: Normal rate and regular rhythm.     Heart sounds: Normal heart sounds. No murmur.  Pulmonary:     Effort: Pulmonary effort is normal. No respiratory distress.     Breath sounds: Normal breath sounds. No wheezing or rales.  Chest:     Chest wall: No tenderness.  Neurological:     Mental Status: She is alert and oriented to person, place, and time.    BP 120/60 (BP Location: Left Arm, Patient Position: Sitting, Cuff Size: Normal)   Pulse 90   Temp 97.8 F (36.6 C) (Temporal)   Resp 18   Ht 5\' 6"  (1.676 m)   Wt 159 lb 6.4 oz (72.3 kg)   SpO2 98%   BMI 25.73 kg/m  Wt Readings from Last 3 Encounters:  02/10/19 159 lb 6.4 oz (72.3 kg)  01/27/19 159 lb 12.8 oz (72.5 kg)  01/07/19 162 lb 9.6 oz (73.8 kg)     Lab Results  Component Value Date   WBC 7.8 11/07/2018   HGB 13.3 11/07/2018   HCT 39.0 11/07/2018   PLT 265.0 11/07/2018   GLUCOSE 175 (H) 01/27/2019   CHOL 128 01/27/2019   TRIG 105.0 01/27/2019   HDL 49.30 01/27/2019   LDLDIRECT 103.0 04/08/2018   LDLCALC 57 01/27/2019   ALT 18 01/27/2019   AST 13 01/27/2019   NA 137 01/27/2019   K 3.7 01/27/2019   CL 97 01/27/2019   CREATININE 0.80 01/27/2019   BUN 16 01/27/2019   CO2 31 01/27/2019   TSH 4.23 07/08/2018   HGBA1C 7.3 (A) 01/07/2019   MICROALBUR 1.1 04/08/2018    No results found.   Assessment & Plan:  Plan  I have discontinued Alyssa Ford's traMADol. I am also having her maintain her BIOTIN PO, Naproxen Sodium (ALEVE PO), latanoprost, Ferrous Sulfate (IRON SUPPLEMENT PO), aspirin EC, Coenzyme Q10 (CO Q 10 PO), amitriptyline, metFORMIN, hydrochlorothiazide, buPROPion, omeprazole, bromocriptine, repaglinide, rosuvastatin, lisinopril, and diazepam. We will continue to administer sodium chloride.  Meds ordered this  encounter  Medications  . diazepam (VALIUM) 5 MG tablet    Sig: 1 po qhs prn    Dispense:  30 tablet    Refill:  0    Problem List Items Addressed This Visit      Unprioritized   DM (diabetes mellitus) type II uncontrolled, periph vascular disorder ([place])    Per endo      Essential hypertension    Well controlled, no changes to meds. Encouraged heart healthy diet such as the DASH diet and exercise as  tolerated.       THYROID NODULE    Refer to endo in [place]       Other Visit Diagnoses    Thyroid nodule    -  Primary   Relevant Orders   Ambulatory referral to ENT   Generalized anxiety disorder       Relevant Medications   diazepam (VALIUM) 5 MG tablet      Follow-up: Return in about 6 months (around 08/13/2019), or if symptoms worsen or fail to improve, for hypertension, hyperlipidemia, annual exam, fasting.  Ann Held, DO

## 2019-02-10 NOTE — Assessment & Plan Note (Signed)
Well controlled, no changes to meds. Encouraged heart healthy diet such as the DASH diet and exercise as tolerated.  °

## 2019-02-10 NOTE — Patient Instructions (Signed)

## 2019-02-10 NOTE — Assessment & Plan Note (Signed)
Refer to endo in [place]

## 2019-02-10 NOTE — Assessment & Plan Note (Signed)
Per endo °

## 2019-02-18 NOTE — Telephone Encounter (Signed)
Referral sent to [facility] ent awaiting an appt

## 2019-02-21 ENCOUNTER — Emergency Department (HOSPITAL_COMMUNITY)
Admission: EM | Admit: 2019-02-21 | Discharge: 2019-02-21 | Disposition: A | Discharge status: Home / Self Care | Payer: Medicare Other | Attending: Emergency Medicine | Admitting: Emergency Medicine

## 2019-02-21 ENCOUNTER — Encounter (HOSPITAL_COMMUNITY): Payer: Self-pay | Admitting: Emergency Medicine

## 2019-02-21 ENCOUNTER — Other Ambulatory Visit: Payer: Self-pay

## 2019-02-21 ENCOUNTER — Emergency Department (HOSPITAL_COMMUNITY): Payer: Medicare Other

## 2019-02-21 DIAGNOSIS — Y998 Other external cause status: Secondary | ICD-10-CM | POA: Insufficient documentation

## 2019-02-21 DIAGNOSIS — S0990XA Unspecified injury of head, initial encounter: Secondary | ICD-10-CM

## 2019-02-21 DIAGNOSIS — S0081XA Abrasion of other part of head, initial encounter: Secondary | ICD-10-CM | POA: Insufficient documentation

## 2019-02-21 DIAGNOSIS — I10 Essential (primary) hypertension: Secondary | ICD-10-CM | POA: Insufficient documentation

## 2019-02-21 DIAGNOSIS — Z23 Encounter for immunization: Secondary | ICD-10-CM | POA: Insufficient documentation

## 2019-02-21 DIAGNOSIS — Y9301 Activity, walking, marching and hiking: Secondary | ICD-10-CM | POA: Diagnosis not present

## 2019-02-21 DIAGNOSIS — Z79899 Other long term (current) drug therapy: Secondary | ICD-10-CM | POA: Insufficient documentation

## 2019-02-21 DIAGNOSIS — S0003XA Contusion of scalp, initial encounter: Secondary | ICD-10-CM | POA: Diagnosis not present

## 2019-02-21 DIAGNOSIS — W010XXA Fall on same level from slipping, tripping and stumbling without subsequent striking against object, initial encounter: Secondary | ICD-10-CM | POA: Insufficient documentation

## 2019-02-21 DIAGNOSIS — Z87891 Personal history of nicotine dependence: Secondary | ICD-10-CM | POA: Insufficient documentation

## 2019-02-21 DIAGNOSIS — E119 Type 2 diabetes mellitus without complications: Secondary | ICD-10-CM | POA: Diagnosis not present

## 2019-02-21 DIAGNOSIS — Z7984 Long term (current) use of oral hypoglycemic drugs: Secondary | ICD-10-CM | POA: Diagnosis not present

## 2019-02-21 DIAGNOSIS — W19XXXA Unspecified fall, initial encounter: Secondary | ICD-10-CM

## 2019-02-21 DIAGNOSIS — T07XXXA Unspecified multiple injuries, initial encounter: Secondary | ICD-10-CM

## 2019-02-21 DIAGNOSIS — Z7982 Long term (current) use of aspirin: Secondary | ICD-10-CM | POA: Insufficient documentation

## 2019-02-21 DIAGNOSIS — Y929 Unspecified place or not applicable: Secondary | ICD-10-CM | POA: Insufficient documentation

## 2019-02-21 MED ORDER — TETANUS-DIPHTH-ACELL PERTUSSIS 5-2.5-18.5 LF-MCG/0.5 IM SUSP
0.5000 mL | Freq: Once | INTRAMUSCULAR | Status: AC
  Administered 2019-02-21: 0.5 mL via INTRAMUSCULAR
  Filled 2019-02-21: qty 0.5

## 2019-02-21 NOTE — Discharge Instructions (Addendum)
Tylenol and Motrin for pain.  Return here as needed.  Follow-up with your doctor.

## 2019-02-21 NOTE — ED Provider Notes (Signed)
[facility] DEPT Provider Note   CSN: [id number] Arrival date & time: 02/21/19  1228     History   Chief Complaint Chief Complaint  Patient presents with  . Fall  . Head Injury  . Abrasion    HPI Alyssa Ford is a 68 y.o. female.     HPI Patient presents to the emergency department with injuries following a fall.  The patient fell while tripping over a landscape timber.  Patient has multiple abrasions on her face.  Patient states that she did not lose consciousness.  The patient did not have any presyncopal symptoms.  Patient states that she did not lose consciousness after hitting her head.  Patient does not have any blurred vision, headache, nausea, vomiting, weakness, dizziness, near-syncope or syncope Past Medical History:  Diagnosis Date  . Anemia   . Anxiety   . Arthritis   . Cataract   . Chronic headaches   . Depression   . Diabetes mellitus   . Diverticulosis of colon (without mention of hemorrhage) 2003, 2008   Colonoscopy  . GERD (gastroesophageal reflux disease)   . Glaucoma   . Hiatal hernia 2003   EGD   . Hyperlipidemia   . Hypertension   . Thyroid nodule     Patient Active Problem List   Diagnosis Date Noted  . Neck mass 07/11/2018  . Abnormal WBC count 05/29/2017  . Skin fissure 09/24/2015  . Allergic rhinitis 03/26/2014  . Wheezing 03/26/2014  . DM (diabetes mellitus) type II uncontrolled, periph vascular disorder ([place]) 08/25/2011  . Headache(784.0) 08/21/2011  . Acute sinusitis 04/26/2010  . URI 04/26/2010  . UNSPECIFIED CATARACT 02/23/2010  . POSTMENOPAUSAL STATUS 12/12/2007  . FATIGUE 09/11/2007  . THYROID NODULE 11/22/2006  . Diabetes ([place]) 11/22/2006  . Hyperlipidemia LDL goal <70 11/22/2006  . GLUCOMA 11/22/2006  . Essential hypertension 11/22/2006  . GERD 11/22/2006    Past Surgical History:  Procedure Laterality Date  . CARPAL TUNNEL RELEASE  2006  . CARPAL TUNNEL RELEASE  12/24/2007  .  CATARACT EXTRACTION, BILATERAL     [place]---  sept and oct 2018  . CERVICAL FUSION  3/08  . CHOLECYSTECTOMY    . FASCIECTOMY Left 08/01/2018   Procedure: FASCIECTOMY LEFT RING;  Surgeon: Daryll Brod, MD;  Location: [facility];  Service: Orthopedics;  Laterality: Left;  UPPER ARM  . TRIGGER FINGER RELEASE Left 08/01/2018   Procedure: RELEASE TRIGGER FINGER/A-1 PULLEY LEFTRING;  Surgeon: Daryll Brod, MD;  Location: [facility];  Service: Orthopedics;  Laterality: Left;     OB History   No obstetric history on file.      Home Medications    Prior to Admission medications   Medication Sig Start Date End Date Taking? Authorizing Provider  amitriptyline (ELAVIL) 25 MG tablet Take 2 tablets (50 mg total) by mouth daily. 09/02/18   Ann Held, DO  aspirin EC 81 MG tablet Take 81 mg by mouth daily.    Carollee Herter, Yvonne R, DO  BIOTIN PO Take 1 tablet by mouth daily.     [provider]  bromocriptine (PARLODEL) 2.5 MG tablet Take 1 tablet (2.5 mg total) by mouth daily. 01/07/19   Renato Shin, MD  buPROPion (WELLBUTRIN XL) 150 MG 24 hr tablet TAKE 2 TABLETS BY MOUTH ONCE EVERY MORNING 12/20/18   Carollee Herter, Alferd Apa, DO  Coenzyme Q10 (CO Q 10 PO) Take 1 capsule by mouth daily.    [provider]  diazepam (VALIUM) 5 MG tablet 1 po qhs prn 02/10/19   Carollee Herter, Alferd Apa, DO  Ferrous Sulfate (IRON SUPPLEMENT PO) Take 2 tablets by mouth daily.     [provider]  hydrochlorothiazide (HYDRODIURIL) 25 MG tablet TAKE 1 TABLET BY MOUTH ONCE DAILY 12/20/18   Carollee Herter, Alferd Apa, DO  latanoprost (XALATAN) 0.005 % ophthalmic solution Place 1 drop into both eyes at bedtime.  10/17/13   [provider]  lisinopril (ZESTRIL) 10 MG tablet Take 1 tablet (10 mg total) by mouth daily. 01/27/19   Ann Held, DO  metFORMIN (GLUCOPHAGE-XR) 500 MG 24 hr tablet Take 4 tablets (2000 mg) daily with breakfast. 10/25/18   Renato Shin, MD   Naproxen Sodium (ALEVE PO) Take 2 tablets by mouth daily.    [provider]  omeprazole (PRILOSEC) 40 MG capsule TAKE 1 CAPSULE BY MOUTH ONCE DAILY 12/20/18   Carollee Herter, Alferd Apa, DO  repaglinide (PRANDIN) 2 MG tablet TAKE 1 TABLET BY MOUTH THREE TIMES DAILY BEFORE MEALS 01/17/19   Renato Shin, MD  rosuvastatin (CRESTOR) 40 MG tablet Take 1 tablet (40 mg total) by mouth at bedtime. 01/21/19   Ann Held, DO    Family History Family History  Problem Relation Age of Onset  . Dementia Mother   . Breast cancer Mother 53  . Kidney failure Mother 36       died from kidney failure from abx given to her for pneumonia  . Hyperlipidemia Mother   . Hypertension Mother   . Diabetes Mother   . Cancer Mother 58       breast  . Parkinsonism Father   . Diabetes Father 55  . Hypertension Sister   . Diabetes Brother   . Hypertension Brother   . Heart attack Brother   . Heart disease Cousin 21       mi  . Colon cancer Neg Hx     Social History Social History   Tobacco Use  . Smoking status: Former Smoker    Packs/day: 1.40    Years: 49.00    Pack years: 68.60    Types: Cigarettes    Quit date: 07/16/2015    Years since quitting: 3.6  . Smokeless tobacco: Never Used  . Tobacco comment: e cig  Substance Use Topics  . Alcohol use: Yes    Alcohol/week: 0.0 standard drinks    Comment: once weekly; wine and beer  . Drug use: No     Allergies   Codeine   Review of Systems Review of Systems All other systems negative except as documented in the HPI. All pertinent positives and negatives as reviewed in the HPI.  Physical Exam Updated Vital Signs BP (!) 118/95 (BP Location: Left Arm)   Pulse 95   Temp 98 F (36.7 C) (Oral)   Resp 18   SpO2 98%   Physical Exam Vitals signs and nursing note reviewed.  Constitutional:      General: She is not in acute distress.    Appearance: She is well-developed.  HENT:     Head: Normocephalic.   Eyes:     Pupils:  Pupils are equal, round, and reactive to light.  Cardiovascular:     Rate and Rhythm: Normal rate and regular rhythm.  Pulmonary:     Effort: Pulmonary effort is normal.  Skin:    General: Skin is warm and dry.  Neurological:     General: No focal  deficit present.     Mental Status: She is alert and oriented to person, place, and time.     Sensory: No sensory deficit.     Motor: No weakness.     Coordination: Coordination normal.      ED Treatments / Results  Labs (all labs ordered are listed, but only abnormal results are displayed) Labs Reviewed - No data to display  EKG None  Radiology Ct Head Wo Contrast  Result Date: 02/21/2019 CLINICAL DATA:  Fall, head injury. EXAM: CT HEAD WITHOUT CONTRAST TECHNIQUE: Contiguous axial images were obtained from the base of the skull through the vertex without intravenous contrast. COMPARISON:  None. FINDINGS: Brain: No evidence of acute infarction, hemorrhage, hydrocephalus, extra-axial collection or mass lesion/mass effect. Vascular: Negative for hyperdense vessel. Atherosclerotic calcification Skull: Negative for skull fracture. Frontal scalp contusion and soft tissue swelling Sinuses/Orbits: Negative Other: None IMPRESSION: No acute intracranial abnormality Frontal scalp contusion. Electronically Signed   By: Franchot Gallo M.D.   On: 02/21/2019 13:18    Procedures Procedures (including critical care time)  Medications Ordered in ED Medications  Tdap (BOOSTRIX) injection 0.5 mL (0.5 mLs Intramuscular Given 02/21/19 1430)     Initial Impression / Assessment and Plan / ED Course  I have reviewed the triage vital signs and the nursing notes.  Pertinent labs & imaging results that were available during my care of the patient were reviewed by me and considered in my medical decision making (see chart for details).        Patient has a negative head CT.  She has multiple abrasions which have advised her she will need to keep clean  and dry.  Patient agrees the plan and all questions were answered. Final Clinical Impressions(s) / ED Diagnoses   Final diagnoses:  None    ED Discharge Orders    None       Dalia Heading, PA-C 02/21/19 1454    Maudie Flakes, MD 02/27/19 [phone]

## 2019-02-21 NOTE — ED Triage Notes (Signed)
Pt reports that she tripped at her son's house when watering the plants and hit her head on the concrete. Pt has abrasions to head, knees, and hands. Takes BABY ASA daily, denies LOC.

## 2019-02-25 ENCOUNTER — Other Ambulatory Visit: Payer: Self-pay | Admitting: Family Medicine

## 2019-02-26 ENCOUNTER — Other Ambulatory Visit: Payer: Self-pay | Admitting: Family Medicine

## 2019-02-26 MED ORDER — AMITRIPTYLINE HCL 25 MG PO TABS: 50.0000 mg | ORAL_TABLET | Freq: Every day | ORAL | 3 refills | Status: DC

## 2019-02-26 NOTE — Telephone Encounter (Signed)
Rx sent 

## 2019-02-26 NOTE — Telephone Encounter (Signed)
RX REFILL amitriptyline [facility] [id number] - [address], [place] [phone] (Phone) [phone]

## 2019-03-05 ENCOUNTER — Ambulatory Visit (INDEPENDENT_AMBULATORY_CARE_PROVIDER_SITE_OTHER): Payer: Medicare Other | Admitting: Family Medicine

## 2019-03-05 ENCOUNTER — Encounter: Payer: Self-pay | Admitting: Family Medicine

## 2019-03-05 ENCOUNTER — Other Ambulatory Visit: Payer: Self-pay

## 2019-03-05 VITALS — BP 119/81 | HR 81 | Temp 96.9°F | Resp 16 | Ht 66.0 in | Wt 160.2 lb

## 2019-03-05 DIAGNOSIS — T50905A Adverse effect of unspecified drugs, medicaments and biological substances, initial encounter: Secondary | ICD-10-CM

## 2019-03-05 DIAGNOSIS — L2489 Irritant contact dermatitis due to other agents: Secondary | ICD-10-CM | POA: Diagnosis not present

## 2019-03-05 MED ORDER — TRIAMCINOLONE ACETONIDE 0.1 % EX CREA: 1.0000 "application " | TOPICAL_CREAM | Freq: Two times a day (BID) | CUTANEOUS | 0 refills | Status: DC

## 2019-03-05 NOTE — Patient Instructions (Signed)
Use over the counter hydrocortisone on your face twice daily for 10 days.  Avoid triggers.  Use the prescription cream on your wrist.  Ice/cold pack over area for 10-15 min twice daily.  Try not to itch.  Stop the lisinopril. Keep an eye on your blood pressure. If consistently >139/89, let Dr Etter Sjogren know. Stay hydrated. Get up slowly.  Let us know if you need anything.

## 2019-03-05 NOTE — Progress Notes (Signed)
Chief Complaint  Patient presents with  . Follow-up    from fall and Lisinopril    Alyssa Ford is a 68 y.o. female here for a skin complaint.  Duration: 3 days Location: R wrist and forehead Pruritic? Yes Painful? No Drainage? No New soaps/lotions/topicals/detergents? Yes- fell 2 weeks ago and had fabric bandaids over it Other associated symptoms: redness Therapies tried thus far: OTC HC cream that helped a little on wrist, helped more on face  Hypertension Patient presents for hypertension follow up. She had a high reading at endo, f/u'd w PCP and started on lisinopril 10 mg/d. Was having lightheadedness when she stood up, stopped med and no longer has issue.  No change in diet or physical activity.   ROS:  Const: No fevers Skin: As noted in HPI  Past Medical History:  Diagnosis Date  . Anemia   . Anxiety   . Arthritis   . Cataract   . Chronic headaches   . Depression   . Diabetes mellitus   . Diverticulosis of colon (without mention of hemorrhage) 2003, 2008   Colonoscopy  . GERD (gastroesophageal reflux disease)   . Glaucoma   . Hiatal hernia 2003   EGD   . Hyperlipidemia   . Hypertension   . Thyroid nodule     BP 119/81   Pulse 81   Temp (!) 96.9 F (36.1 C) (Skin)   Resp 16   Ht 5\' 6"  (1.676 m)   Wt 160 lb 4 oz (72.7 kg)   SpO2 98%   BMI 25.87 kg/m  Gen: awake, alert, appearing stated age Lungs: No accessory muscle use Skin: See below. No drainage, TTP, fluctuance Psych: Age appropriate judgment and insight      Irritant contact dermatitis due to other agents - Plan: triamcinolone cream (KENALOG) 0.1 %  Adverse effect of drug, initial encounter  1- OTC cream for face, rx for wrist. Avoid fabric bandaids. Ice, try not to itch. 2- stop ACEi. Monitor BP's at home, let PCP know if >140/90.  F/u prn. The patient voiced understanding and agreement to the plan.  [place], DO 03/05/19 4:08 PM

## 2019-03-12 DIAGNOSIS — E041 Nontoxic single thyroid nodule: Secondary | ICD-10-CM | POA: Diagnosis not present

## 2019-03-12 DIAGNOSIS — R221 Localized swelling, mass and lump, neck: Secondary | ICD-10-CM | POA: Diagnosis not present

## 2019-03-14 ENCOUNTER — Other Ambulatory Visit: Payer: Self-pay | Admitting: *Deleted

## 2019-03-24 DIAGNOSIS — E041 Nontoxic single thyroid nodule: Secondary | ICD-10-CM | POA: Diagnosis not present

## 2019-03-24 DIAGNOSIS — R221 Localized swelling, mass and lump, neck: Secondary | ICD-10-CM | POA: Diagnosis not present

## 2019-03-25 ENCOUNTER — Other Ambulatory Visit: Payer: Self-pay

## 2019-03-25 NOTE — Patient Outreach (Signed)
[facility]) Care Management  03/25/2019  Alyssa Ford 01/13/1951 [id number]   Medication Adherence call to [name] HIPPA Compliant Voice message left with a call back number. [name] is showing past due on Lisinopril 10 mg under under [place].   [facility] Management Direct Dial [phone]  Fax [phone] [name]

## 2019-04-03 ENCOUNTER — Other Ambulatory Visit: Payer: Self-pay | Admitting: Endocrinology

## 2019-04-03 ENCOUNTER — Other Ambulatory Visit: Payer: Self-pay | Admitting: *Deleted

## 2019-04-03 DIAGNOSIS — E1165 Type 2 diabetes mellitus with hyperglycemia: Secondary | ICD-10-CM

## 2019-04-03 DIAGNOSIS — E1151 Type 2 diabetes mellitus with diabetic peripheral angiopathy without gangrene: Secondary | ICD-10-CM

## 2019-04-03 DIAGNOSIS — IMO0002 Reserved for concepts with insufficient information to code with codable children: Secondary | ICD-10-CM

## 2019-04-03 MED ORDER — ROSUVASTATIN CALCIUM 40 MG PO TABS: 40.0000 mg | ORAL_TABLET | Freq: Every day | ORAL | 1 refills | Status: DC

## 2019-04-04 ENCOUNTER — Other Ambulatory Visit: Payer: Self-pay

## 2019-04-08 ENCOUNTER — Encounter: Payer: Self-pay | Admitting: Endocrinology

## 2019-04-08 ENCOUNTER — Other Ambulatory Visit: Payer: Self-pay

## 2019-04-08 ENCOUNTER — Ambulatory Visit (INDEPENDENT_AMBULATORY_CARE_PROVIDER_SITE_OTHER): Payer: Medicare Other | Admitting: Endocrinology

## 2019-04-08 VITALS — BP 140/72 | HR 77 | Ht 66.0 in | Wt 161.8 lb

## 2019-04-08 DIAGNOSIS — Z23 Encounter for immunization: Secondary | ICD-10-CM | POA: Diagnosis not present

## 2019-04-08 DIAGNOSIS — IMO0002 Reserved for concepts with insufficient information to code with codable children: Secondary | ICD-10-CM

## 2019-04-08 DIAGNOSIS — E1165 Type 2 diabetes mellitus with hyperglycemia: Secondary | ICD-10-CM

## 2019-04-08 DIAGNOSIS — E1151 Type 2 diabetes mellitus with diabetic peripheral angiopathy without gangrene: Secondary | ICD-10-CM

## 2019-04-08 LAB — POCT GLYCOSYLATED HEMOGLOBIN (HGB A1C): Hemoglobin A1C: 7 % — AB (ref 4.0–5.6)

## 2019-04-08 MED ORDER — ACARBOSE 25 MG PO TABS: 25.0000 mg | ORAL_TABLET | Freq: Three times a day (TID) | ORAL | 11 refills | Status: DC

## 2019-04-08 NOTE — Progress Notes (Signed)
Subjective:    Patient ID: Alyssa Ford, female    DOB: 25-May-1951, 68 y.o.   MRN: [id number]  HPI Pt returns for f/u of diabetes mellitus: DM type: 2 Dx'ed: [id number] Complications: polyneuropathy and CAD (seen on CT).   Therapy: 3 oral meds GDM: never DKA: never Severe hypoglycemia: never Pancreatitis: never Pancreatic imaging: never Other: she has never been on insulin; she wants generic meds, if possible.   Interval history: Pt states cbg's are well-controlled.  She takes meds as rx'ed.  pt states she feels well in general.   Pt also has MNG ([place] in 2020 showed Left lower pole nodule 1 has slightly enlarged compared with greater than 10 years ago. It meets criteria for annual follow-up).   Past Medical History:  Diagnosis Date  . Anemia   . Anxiety   . Arthritis   . Cataract   . Chronic headaches   . Depression   . Diabetes mellitus   . Diverticulosis of colon (without mention of hemorrhage) 2003, 2008   Colonoscopy  . GERD (gastroesophageal reflux disease)   . Glaucoma   . Hiatal hernia 2003   EGD   . Hyperlipidemia   . Hypertension   . Thyroid nodule     Past Surgical History:  Procedure Laterality Date  . CARPAL TUNNEL RELEASE  2006  . CARPAL TUNNEL RELEASE  12/24/2007  . CATARACT EXTRACTION, BILATERAL     [facility]---  sept and oct 2018  . CERVICAL FUSION  3/08  . CHOLECYSTECTOMY    . FASCIECTOMY Left 08/01/2018   Procedure: FASCIECTOMY LEFT RING;  Surgeon: Daryll Brod, MD;  Location: [facility];  Service: Orthopedics;  Laterality: Left;  UPPER ARM  . TRIGGER FINGER RELEASE Left 08/01/2018   Procedure: RELEASE TRIGGER FINGER/A-1 PULLEY LEFTRING;  Surgeon: Daryll Brod, MD;  Location: [facility];  Service: Orthopedics;  Laterality: Left;    Social History   Socioeconomic History  . Marital status: Married    Spouse name: Not on file  . Number of children: Not on file  . Years of education: Not on file  . Highest education level: Not  on file  Occupational History  . Occupation: retired  Scientific laboratory technician  . Financial resource strain: Not on file  . Food insecurity    Worry: Not on file    Inability: Not on file  . Transportation needs    Medical: Not on file    Non-medical: Not on file  Tobacco Use  . Smoking status: Former Smoker    Packs/day: 1.40    Years: 49.00    Pack years: 68.60    Types: Cigarettes    Quit date: 07/16/2015    Years since quitting: 3.7  . Smokeless tobacco: Never Used  . Tobacco comment: e cig  Substance and Sexual Activity  . Alcohol use: Yes    Alcohol/week: 0.0 standard drinks    Comment: once weekly; wine and beer  . Drug use: No  . Sexual activity: Yes    Partners: Male  Lifestyle  . Physical activity    Days per week: Not on file    Minutes per session: Not on file  . Stress: Not on file  Relationships  . Social Herbalist on phone: Not on file    Gets together: Not on file    Attends religious service: Not on file    Active member of club or organization: Not on file  Attends meetings of clubs or organizations: Not on file    Relationship status: Not on file  . Intimate partner violence    Fear of current or ex partner: Not on file    Emotionally abused: Not on file    Physically abused: Not on file    Forced sexual activity: Not on file  Other Topics Concern  . Not on file  Social History Narrative   Exercise--  walk    Current Outpatient Medications on File Prior to Visit  Medication Sig Dispense Refill  . amitriptyline (ELAVIL) 25 MG tablet Take 2 tablets (50 mg total) by mouth daily. 60 tablet 3  . aspirin EC 81 MG tablet Take 81 mg by mouth daily.    Marland Kitchen BIOTIN PO Take 1 tablet by mouth daily.     . bromocriptine (PARLODEL) 2.5 MG tablet Take 1 tablet (2.5 mg total) by mouth daily. 90 tablet 3  . buPROPion (WELLBUTRIN XL) 150 MG 24 hr tablet TAKE 2 TABLETS BY MOUTH ONCE EVERY MORNING 180 tablet 1  . Coenzyme Q10 (CO Q 10 PO) Take 1 capsule by mouth  daily.    . diazepam (VALIUM) 5 MG tablet 1 po qhs prn 30 tablet 0  . Ferrous Sulfate (IRON SUPPLEMENT PO) Take 2 tablets by mouth daily.     . hydrochlorothiazide (HYDRODIURIL) 25 MG tablet TAKE 1 TABLET BY MOUTH ONCE DAILY 90 tablet 1  . latanoprost (XALATAN) 0.005 % ophthalmic solution Place 1 drop into both eyes at bedtime.     . metFORMIN (GLUCOPHAGE-XR) 500 MG 24 hr tablet Take 4 tablets (2000 mg) daily with breakfast. 120 tablet 11  . Naproxen Sodium (ALEVE PO) Take 2 tablets by mouth daily.    Marland Kitchen omeprazole (PRILOSEC) 40 MG capsule TAKE 1 CAPSULE BY MOUTH ONCE DAILY 90 capsule 1  . repaglinide (PRANDIN) 2 MG tablet TAKE 1 TABLET BY MOUTH THREE TIMES DAILY BEFORE MEALS 270 tablet 0  . rosuvastatin (CRESTOR) 40 MG tablet Take 1 tablet (40 mg total) by mouth at bedtime. 90 tablet 1  . triamcinolone cream (KENALOG) 0.1 % Apply 1 application topically 2 (two) times daily. 30 g 0   No current facility-administered medications on file prior to visit.     Allergies  Allergen Reactions  . Codeine Nausea Only    Severe nausea    Family History  Problem Relation Age of Onset  . Dementia Mother   . Breast cancer Mother 68  . Kidney failure Mother 24       died from kidney failure from abx given to her for pneumonia  . Hyperlipidemia Mother   . Hypertension Mother   . Diabetes Mother   . Cancer Mother 58       breast  . Parkinsonism Father   . Diabetes Father 72  . Hypertension Sister   . Diabetes Brother   . Hypertension Brother   . Heart attack Brother   . Heart disease Cousin 70       mi  . Colon cancer Neg Hx     BP 140/72 (BP Location: Left Arm, Patient Position: Sitting, Cuff Size: Normal)   Pulse 77   Ht 5\' 6"  (1.676 m)   Wt 161 lb 12.8 oz (73.4 kg)   SpO2 95%   BMI 26.12 kg/m    Review of Systems She denies hypoglycemia    Objective:   Physical Exam VITAL SIGNS:  See vs page GENERAL: no distress Pulses: dorsalis pedis intact bilat.  MSK: no deformity of  the feet CV: no leg edema Skin:  no ulcer on the feet.  normal color and temp on the feet. Neuro: sensation is intact to touch on the feet  Lab Results  Component Value Date   HGBA1C 7.0 (A) 04/08/2019   Lab Results  Component Value Date   CREATININE 0.80 01/27/2019   BUN 16 01/27/2019   NA 137 01/27/2019   K 3.7 01/27/2019   CL 97 01/27/2019   CO2 31 01/27/2019       Assessment & Plan:  HTN: is noted today Type 2 DM: she needs increased rx  Patient Instructions  Your blood pressure is high today.  Please see your primary care provider soon, to have it rechecked.  I have sent a prescription to your pharmacy, to add "acarbose." check your blood sugar once a day.  vary the time of day when you check, between before the 3 meals, and at bedtime.  also check if you have symptoms of your blood sugar being too high or too low.  please keep a record of the readings and bring it to your next appointment here (or you can bring the meter itself).  You can write it on any piece of paper.  please call us sooner if your blood sugar goes below 70, or if you have a lot of readings over 200. Please come back for a follow-up appointment in 3 months.

## 2019-04-08 NOTE — Patient Instructions (Addendum)
Your blood pressure is high today.  Please see your primary care provider soon, to have it rechecked.  I have sent a prescription to your pharmacy, to add "acarbose." check your blood sugar once a day.  vary the time of day when you check, between before the 3 meals, and at bedtime.  also check if you have symptoms of your blood sugar being too high or too low.  please keep a record of the readings and bring it to your next appointment here (or you can bring the meter itself).  You can write it on any piece of paper.  please call us sooner if your blood sugar goes below 70, or if you have a lot of readings over 200. Please come back for a follow-up appointment in 3 months.

## 2019-04-17 ENCOUNTER — Ambulatory Visit (INDEPENDENT_AMBULATORY_CARE_PROVIDER_SITE_OTHER): Payer: Medicare Other | Admitting: Family Medicine

## 2019-04-17 ENCOUNTER — Encounter: Payer: Self-pay | Admitting: Family Medicine

## 2019-04-17 ENCOUNTER — Other Ambulatory Visit: Payer: Self-pay

## 2019-04-17 VITALS — BP 126/70 | HR 76 | Temp 97.5°F | Resp 18 | Ht 66.0 in | Wt 161.4 lb

## 2019-04-17 DIAGNOSIS — Z0000 Encounter for general adult medical examination without abnormal findings: Secondary | ICD-10-CM

## 2019-04-17 DIAGNOSIS — E538 Deficiency of other specified B group vitamins: Secondary | ICD-10-CM

## 2019-04-17 DIAGNOSIS — E041 Nontoxic single thyroid nodule: Secondary | ICD-10-CM | POA: Diagnosis not present

## 2019-04-17 DIAGNOSIS — E1165 Type 2 diabetes mellitus with hyperglycemia: Secondary | ICD-10-CM

## 2019-04-17 DIAGNOSIS — E118 Type 2 diabetes mellitus with unspecified complications: Secondary | ICD-10-CM

## 2019-04-17 DIAGNOSIS — I10 Essential (primary) hypertension: Secondary | ICD-10-CM | POA: Diagnosis not present

## 2019-04-17 DIAGNOSIS — E785 Hyperlipidemia, unspecified: Secondary | ICD-10-CM | POA: Diagnosis not present

## 2019-04-17 DIAGNOSIS — E1169 Type 2 diabetes mellitus with other specified complication: Secondary | ICD-10-CM | POA: Diagnosis not present

## 2019-04-17 DIAGNOSIS — IMO0002 Reserved for concepts with insufficient information to code with codable children: Secondary | ICD-10-CM

## 2019-04-17 DIAGNOSIS — E2839 Other primary ovarian failure: Secondary | ICD-10-CM

## 2019-04-17 DIAGNOSIS — E1151 Type 2 diabetes mellitus with diabetic peripheral angiopathy without gangrene: Secondary | ICD-10-CM

## 2019-04-17 LAB — COMPREHENSIVE METABOLIC PANEL
ALT: 18 U/L (ref 0–35)
AST: 14 U/L (ref 0–37)
Albumin: 4.7 g/dL (ref 3.5–5.2)
Alkaline Phosphatase: 93 U/L (ref 39–117)
BUN: 13 mg/dL (ref 6–23)
CO2: 33 mEq/L — ABNORMAL HIGH (ref 19–32)
Calcium: 9.9 mg/dL (ref 8.4–10.5)
Chloride: 92 mEq/L — ABNORMAL LOW (ref 96–112)
Creatinine, Ser: 0.81 mg/dL (ref 0.40–1.20)
GFR: 70.09 mL/min (ref >60.00)
Glucose, Bld: 157 mg/dL — ABNORMAL HIGH (ref 70–99)
Potassium: 3.8 mEq/L (ref 3.5–5.1)
Sodium: 136 mEq/L (ref 135–145)
Total Bilirubin: 0.4 mg/dL (ref 0.2–1.2)
Total Protein: 7.1 g/dL (ref 6.0–8.3)

## 2019-04-17 LAB — CBC WITH DIFFERENTIAL/PLATELET
Basophils Absolute: 0.1 10*3/uL (ref 0.0–0.1)
Basophils Relative: 0.6 % (ref 0.0–3.0)
Eosinophils Absolute: 0.2 10*3/uL (ref 0.0–0.7)
Eosinophils Relative: 2.1 % (ref 0.0–5.0)
HCT: 37.4 % (ref 36.0–46.0)
Hemoglobin: 12.6 g/dL (ref 12.0–15.0)
Lymphocytes Relative: 25.7 % (ref 12.0–46.0)
Lymphs Abs: 2.3 10*3/uL (ref 0.7–4.0)
MCHC: 33.7 g/dL (ref 30.0–36.0)
MCV: 90.1 fl (ref 78.0–100.0)
Monocytes Absolute: 0.6 10*3/uL (ref 0.1–1.0)
Monocytes Relative: 7 % (ref 3.0–12.0)
Neutro Abs: 5.9 10*3/uL (ref 1.4–7.7)
Neutrophils Relative %: 64.6 % (ref 43.0–77.0)
Platelets: 258 10*3/uL (ref 150.0–400.0)
RBC: 4.15 Mil/uL (ref 3.87–5.11)
RDW: 14 % (ref 11.5–15.5)
WBC: 9.1 10*3/uL (ref 4.0–10.5)

## 2019-04-17 LAB — MICROALBUMIN / CREATININE URINE RATIO
Creatinine,U: 30.7 mg/dL
Microalb Creat Ratio: 3.7 mg/g (ref 0.0–30.0)
Microalb, Ur: 1.1 mg/dL (ref 0.0–1.9)

## 2019-04-17 LAB — LIPID PANEL
Cholesterol: 134 mg/dL (ref 0–200)
HDL: 46.1 mg/dL (ref >39.00)
LDL Cholesterol: 57 mg/dL (ref 0–99)
NonHDL: 88.24
Total CHOL/HDL Ratio: 3
Triglycerides: 154 mg/dL — ABNORMAL HIGH (ref 0.0–149.0)
VLDL: 30.8 mg/dL (ref 0.0–40.0)

## 2019-04-17 LAB — VITAMIN B12: Vitamin B-12: 1337 pg/mL — ABNORMAL HIGH (ref 211–911)

## 2019-04-17 MED ORDER — ROSUVASTATIN CALCIUM 40 MG PO TABS: 40.0000 mg | ORAL_TABLET | Freq: Every day | ORAL | 1 refills | Status: DC

## 2019-04-17 MED ORDER — LISINOPRIL 5 MG PO TABS: 5.0000 mg | ORAL_TABLET | Freq: Every day | ORAL | 3 refills | Status: DC

## 2019-04-17 NOTE — Assessment & Plan Note (Signed)
Tolerating statin, encouraged heart healthy diet, avoid trans fats, minimize simple carbs and saturated fats. Increase exercise as tolerated 

## 2019-04-17 NOTE — Assessment & Plan Note (Signed)
Poorly controlled will alter medications, encouraged DASH diet, minimize caffeine and obtain adequate sleep. Report concerning symptoms and follow up as directed and as needed 

## 2019-04-17 NOTE — Assessment & Plan Note (Signed)
Per endo °

## 2019-04-17 NOTE — Assessment & Plan Note (Signed)
Pt having bx today

## 2019-04-17 NOTE — Patient Instructions (Signed)
Preventive Care 38 Years and Older, Female Preventive care refers to lifestyle choices and visits with your health care provider that can promote health and wellness. This includes:  A yearly physical exam. This is also called an annual well check.  Regular dental and eye exams.  Immunizations.  Screening for certain conditions.  Healthy lifestyle choices, such as diet and exercise. What can I expect for my preventive care visit? Physical exam Your health care provider will check:  Height and weight. These may be used to calculate body mass index (BMI), which is a measurement that tells if you are at a healthy weight.  Heart rate and blood pressure.  Your skin for abnormal spots. Counseling Your health care provider may ask you questions about:  Alcohol, tobacco, and drug use.  Emotional well-being.  Home and relationship well-being.  Sexual activity.  Eating habits.  History of falls.  Memory and ability to understand (cognition).  Work and work Statistician.  Pregnancy and menstrual history. What immunizations do I need?  Influenza (flu) vaccine  This is recommended every year. Tetanus, diphtheria, and pertussis (Tdap) vaccine  You may need a Td booster every 10 years. Varicella (chickenpox) vaccine  You may need this vaccine if you have not already been vaccinated. Zoster (shingles) vaccine  You may need this after age 33. Pneumococcal conjugate (PCV13) vaccine  One dose is recommended after age 33. Pneumococcal polysaccharide (PPSV23) vaccine  One dose is recommended after age 72. Measles, mumps, and rubella (MMR) vaccine  You may need at least one dose of MMR if you were born in 1957 or later. You may also need a second dose. Meningococcal conjugate (MenACWY) vaccine  You may need this if you have certain conditions. Hepatitis A vaccine  You may need this if you have certain conditions or if you travel or work in places where you may be exposed  to hepatitis A. Hepatitis B vaccine  You may need this if you have certain conditions or if you travel or work in places where you may be exposed to hepatitis B. Haemophilus influenzae type b (Hib) vaccine  You may need this if you have certain conditions. You may receive vaccines as individual doses or as more than one vaccine together in one shot (combination vaccines). Talk with your health care provider about the risks and benefits of combination vaccines. What tests do I need? Blood tests  Lipid and cholesterol levels. These may be checked every 5 years, or more frequently depending on your overall health.  Hepatitis C test.  Hepatitis B test. Screening  Lung cancer screening. You may have this screening every year starting at age 39 if you have a 30-pack-year history of smoking and currently smoke or have quit within the past 15 years.  Colorectal cancer screening. All adults should have this screening starting at age 36 and continuing until age 15. Your health care provider may recommend screening at age 23 if you are at increased risk. You will have tests every 1-10 years, depending on your results and the type of screening test.  Diabetes screening. This is done by checking your blood sugar (glucose) after you have not eaten for a while (fasting). You may have this done every 1-3 years.  Mammogram. This may be done every 1-2 years. Talk with your health care provider about how often you should have regular mammograms.  BRCA-related cancer screening. This may be done if you have a family history of breast, ovarian, tubal, or peritoneal cancers.  Other tests  Sexually transmitted disease (STD) testing.  Bone density scan. This is done to screen for osteoporosis. You may have this done starting at age 76. Follow these instructions at home: Eating and drinking  Eat a diet that includes fresh fruits and vegetables, whole grains, lean protein, and low-fat dairy products. Limit  your intake of foods with high amounts of sugar, saturated fats, and salt.  Take vitamin and mineral supplements as recommended by your health care provider.  Do not drink alcohol if your health care provider tells you not to drink.  If you drink alcohol: ? Limit how much you have to 0-1 drink a day. ? Be aware of how much alcohol is in your drink. In the [place] one drink equals one 12 oz bottle of beer (355 mL), one 5 oz glass of wine (148 mL), or one 1 oz glass of hard liquor (44 mL). Lifestyle  Take daily care of your teeth and gums.  Stay active. Exercise for at least 30 minutes on 5 or more days each week.  Do not use any products that contain nicotine or tobacco, such as cigarettes, e-cigarettes, and chewing tobacco. If you need help quitting, ask your health care provider.  If you are sexually active, practice safe sex. Use a condom or other form of protection in order to prevent STIs (sexually transmitted infections).  Talk with your health care provider about taking a low-dose aspirin or statin. What's next?  Go to your health care provider once a year for a well check visit.  Ask your health care provider how often you should have your eyes and teeth checked.  Stay up to date on all vaccines. This information is not intended to replace advice given to you by your health care provider. Make sure you discuss any questions you have with your health care provider. Document Released: 07/09/2015 Document Revised: 06/06/2018 Document Reviewed: 06/06/2018 Elsevier Patient Education  2020 [employer].

## 2019-04-17 NOTE — Progress Notes (Signed)
Subjective:     Alyssa Ford is a 68 y.o. female and is here for a comprehensive physical exam. The patient reports no problems.---- pt is having a thyroid bx today with ent  She also needs her labs and refills   Social History   Socioeconomic History  . Marital status: Married    Spouse name: Not on file  . Number of children: Not on file  . Years of education: Not on file  . Highest education level: Not on file  Occupational History  . Occupation: retired  Scientific laboratory technician  . Financial resource strain: Not on file  . Food insecurity    Worry: Not on file    Inability: Not on file  . Transportation needs    Medical: Not on file    Non-medical: Not on file  Tobacco Use  . Smoking status: Former Smoker    Packs/day: 1.40    Years: 49.00    Pack years: 68.60    Types: Cigarettes    Quit date: 07/16/2015    Years since quitting: 3.7  . Smokeless tobacco: Never Used  . Tobacco comment: e cig  Substance and Sexual Activity  . Alcohol use: Yes    Alcohol/week: 0.0 standard drinks    Comment: once weekly; wine and beer  . Drug use: No  . Sexual activity: Yes    Partners: Male  Lifestyle  . Physical activity    Days per week: Not on file    Minutes per session: Not on file  . Stress: Not on file  Relationships  . Social Herbalist on phone: Not on file    Gets together: Not on file    Attends religious service: Not on file    Active member of club or organization: Not on file    Attends meetings of clubs or organizations: Not on file    Relationship status: Not on file  . Intimate partner violence    Fear of current or ex partner: Not on file    Emotionally abused: Not on file    Physically abused: Not on file    Forced sexual activity: Not on file  Other Topics Concern  . Not on file  Social History Narrative   Exercise--  walk   Health Maintenance  Topic Date Due  . PNA vac Low Risk Adult (2 of 2 - PPSV23) 05/07/2018  . DEXA SCAN  04/05/2019  .  OPHTHALMOLOGY EXAM  06/18/2019  . FOOT EXAM  07/09/2019  . MAMMOGRAM  07/13/2019  . HEMOGLOBIN A1C  10/07/2019  . COLONOSCOPY  11/21/2020  . TETANUS/TDAP  02/20/2029  . INFLUENZA VACCINE  Completed  . Hepatitis C Screening  Completed    The following portions of the patient's history were reviewed and updated as appropriate: She  has a past medical history of Anemia, Anxiety, Arthritis, Cataract, Chronic headaches, Depression, Diabetes mellitus, Diverticulosis of colon (without mention of hemorrhage) (2003, 2008), GERD (gastroesophageal reflux disease), Glaucoma, Hiatal hernia (2003), Hyperlipidemia, Hypertension, and Thyroid nodule. She does not have any pertinent problems on file. She  has a past surgical history that includes Cholecystectomy; Carpal tunnel release (2006); Carpal tunnel release (12/24/2007); Cervical fusion (3/08); Cataract extraction, bilateral; Fasciectomy (Left, 08/01/2018); and Trigger finger release (Left, 08/01/2018). Her family history includes Breast cancer (age of onset: 25) in her mother; Cancer (age of onset: 52) in her mother; Dementia in her mother; Diabetes in her brother and mother; Diabetes (age of onset: 35) in  her father; Heart attack in her brother; Heart disease (age of onset: 29) in her cousin; Hyperlipidemia in her mother; Hypertension in her brother, mother, and sister; Kidney failure (age of onset: 83) in her mother; Parkinsonism in her father. She  reports that she quit smoking about 3 years ago. Her smoking use included cigarettes. She has a 68.60 pack-year smoking history. She has never used smokeless tobacco. She reports current alcohol use. She reports that she does not use drugs. She has a current medication list which includes the following prescription(s): acarbose, amitriptyline, aspirin ec, biotin, bromocriptine, bupropion, coenzyme q10, diazepam, ferrous sulfate, hydrochlorothiazide, latanoprost, metformin, naproxen sodium, omeprazole, repaglinide,  rosuvastatin, triamcinolone cream, and lisinopril. Current Outpatient Medications on File Prior to Visit  Medication Sig Dispense Refill  . acarbose (PRECOSE) 25 MG tablet Take 1 tablet (25 mg total) by mouth 3 (three) times daily with meals. 90 tablet 11  . amitriptyline (ELAVIL) 25 MG tablet Take 2 tablets (50 mg total) by mouth daily. 60 tablet 3  . aspirin EC 81 MG tablet Take 81 mg by mouth daily.    Marland Kitchen BIOTIN PO Take 1 tablet by mouth daily.     . bromocriptine (PARLODEL) 2.5 MG tablet Take 1 tablet (2.5 mg total) by mouth daily. 90 tablet 3  . buPROPion (WELLBUTRIN XL) 150 MG 24 hr tablet TAKE 2 TABLETS BY MOUTH ONCE EVERY MORNING 180 tablet 1  . Coenzyme Q10 (CO Q 10 PO) Take 1 capsule by mouth daily.    . diazepam (VALIUM) 5 MG tablet 1 po qhs prn 30 tablet 0  . Ferrous Sulfate (IRON SUPPLEMENT PO) Take 2 tablets by mouth daily.     . hydrochlorothiazide (HYDRODIURIL) 25 MG tablet TAKE 1 TABLET BY MOUTH ONCE DAILY 90 tablet 1  . latanoprost (XALATAN) 0.005 % ophthalmic solution Place 1 drop into both eyes at bedtime.     . metFORMIN (GLUCOPHAGE-XR) 500 MG 24 hr tablet Take 4 tablets (2000 mg) daily with breakfast. 120 tablet 11  . Naproxen Sodium (ALEVE PO) Take 2 tablets by mouth daily.    Marland Kitchen omeprazole (PRILOSEC) 40 MG capsule TAKE 1 CAPSULE BY MOUTH ONCE DAILY 90 capsule 1  . repaglinide (PRANDIN) 2 MG tablet TAKE 1 TABLET BY MOUTH THREE TIMES DAILY BEFORE MEALS 270 tablet 0  . rosuvastatin (CRESTOR) 40 MG tablet Take 1 tablet (40 mg total) by mouth at bedtime. 90 tablet 1  . triamcinolone cream (KENALOG) 0.1 % Apply 1 application topically 2 (two) times daily. 30 g 0   No current facility-administered medications on file prior to visit.    She is allergic to codeine..  Review of Systems Review of Systems  Constitutional: Negative for activity change, appetite change and fatigue.  HENT: Negative for hearing loss, congestion, tinnitus and ear discharge.  dentist q91m Eyes:  Negative for visual disturbance (see optho q1y -- vision corrected to 20/20 with glasses).  Respiratory: Negative for cough, chest tightness and shortness of breath.   Cardiovascular: Negative for chest pain, palpitations and leg swelling.  Gastrointestinal: Negative for abdominal pain, diarrhea, constipation and abdominal distention.  Genitourinary: Negative for urgency, frequency, decreased urine volume and difficulty urinating.  Musculoskeletal: Negative for back pain, arthralgias and gait problem.  Skin: Negative for color change, pallor and rash.  Neurological: Negative for dizziness, light-headedness, numbness and headaches.  Hematological: Negative for adenopathy. Does not bruise/bleed easily.  Psychiatric/Behavioral: Negative for suicidal ideas, confusion, sleep disturbance, self-injury, dysphoric mood, decreased concentration and agitation.  Objective:    BP 126/70 (BP Location: Right Arm, Patient Position: Sitting, Cuff Size: Normal)   Pulse 76   Temp (!) 97.5 F (36.4 C) (Temporal)   Resp 18   Ht 5\' 6"  (1.676 m)   Wt 161 lb 6.4 oz (73.2 kg)   SpO2 98%   BMI 26.05 kg/m  General appearance: alert, cooperative, appears stated age and no distress Head: Normocephalic, without obvious abnormality, atraumatic Eyes: negative findings: lids and lashes normal and conjunctivae and sclerae normal Ears: normal TM's and external ear canals both ears Nose: Nares normal. Septum midline. Mucosa normal. No drainage or sinus tenderness. Throat: lips, mucosa, and tongue normal; teeth and gums normal Neck: no adenopathy, no carotid bruit, no JVD, supple, symmetrical, trachea midline and thyroid not enlarged, symmetric, no tenderness/mass/nodules Back: symmetric, no curvature. ROM normal. No CVA tenderness. Lungs: clear to auscultation bilaterally Breasts: normal appearance, no masses or tenderness Heart: regular rate and rhythm, S1, S2 normal, no murmur, click, rub or gallop Abdomen:  soft, non-tender; bowel sounds normal; no masses,  no organomegaly Pelvic: not indicated; post-menopausal, no abnormal Pap smears in past Extremities: extremities normal, atraumatic, no cyanosis or edema Pulses: 2+ and symmetric Skin: Skin color, texture, turgor normal. No rashes or lesions Lymph nodes: Cervical, supraclavicular, and axillary nodes normal. Neurologic: Alert and oriented X 3, normal strength and tone. Normal symmetric reflexes. Normal coordination and gait    Assessment:    Healthy female exam.      Plan:     ghm utd Check labs  See After Visit Summary for Counseling Recommendations    1. Essential hypertension Pt bp was up at endo after her bp med was stopped prior to that Restart lisinopril at 5 mg--  Pt will call if her dizziness returns  - CBC with Differential/Platelet  2. Controlled type 2 diabetes mellitus with complication, without long-term current use of insulin (HCC) Per endo - lisinopril (ZESTRIL) 5 MG tablet; Take 1 tablet (5 mg total) by mouth daily.  Dispense: 90 tablet; Refill: 3 - CBC with Differential/Platelet - Comprehensive metabolic panel - Microalbumin / creatinine urine ratio  3. Hyperlipidemia associated with type 2 diabetes mellitus ([place]) Tolerating statin, encouraged heart healthy diet, avoid trans fats, minimize simple carbs and saturated fats. Increase exercise as tolerated - Lipid panel - Comprehensive metabolic panel  4. B12 deficiency Recheck labs - Vitamin B12  5. Estrogen deficiency   - DG Bone Density; Future  6. THYROID NODULE ent doing bx today  7. Hyperlipidemia LDL goal <70 Tolerating statin, encouraged heart healthy diet, avoid trans fats, minimize simple carbs and saturated fats. Increase exercise as tolerated  8. DM (diabetes mellitus) type II uncontrolled, periph vascular disorder ([place]) Per endo

## 2019-04-18 DIAGNOSIS — E041 Nontoxic single thyroid nodule: Secondary | ICD-10-CM | POA: Diagnosis not present

## 2019-05-05 DIAGNOSIS — H26493 Other secondary cataract, bilateral: Secondary | ICD-10-CM | POA: Diagnosis not present

## 2019-05-05 DIAGNOSIS — H401131 Primary open-angle glaucoma, bilateral, mild stage: Secondary | ICD-10-CM | POA: Diagnosis not present

## 2019-05-05 DIAGNOSIS — E119 Type 2 diabetes mellitus without complications: Secondary | ICD-10-CM | POA: Diagnosis not present

## 2019-05-05 DIAGNOSIS — Z961 Presence of intraocular lens: Secondary | ICD-10-CM | POA: Diagnosis not present

## 2019-05-09 ENCOUNTER — Other Ambulatory Visit: Payer: Self-pay | Admitting: Family Medicine

## 2019-05-09 DIAGNOSIS — Z1231 Encounter for screening mammogram for malignant neoplasm of breast: Secondary | ICD-10-CM

## 2019-05-27 ENCOUNTER — Encounter: Payer: Self-pay | Admitting: Podiatry

## 2019-05-27 ENCOUNTER — Other Ambulatory Visit: Payer: Self-pay

## 2019-05-27 ENCOUNTER — Ambulatory Visit: Payer: Medicare Other | Admitting: Podiatry

## 2019-05-27 DIAGNOSIS — M79676 Pain in unspecified toe(s): Secondary | ICD-10-CM | POA: Diagnosis not present

## 2019-05-27 DIAGNOSIS — B351 Tinea unguium: Secondary | ICD-10-CM

## 2019-05-27 NOTE — Progress Notes (Signed)
This patient presents the office today for nail care and evaluation of her diabetic feet.  She says that her nails are long and thick and she is unable to self treat.  She presents the office today for nail care and a diabetic foot exam.General Appearance  Alert, conversant and in no acute stress.  Vascular  Dorsalis pedis  are palpable  bilaterally. Posterior tibial pulses are weakly palpable  B/L. Capillary return is within normal limits  bilaterally. Temperature is within normal limits  bilaterally.  Neurologic  Senn-Weinstein monofilament wire test within normal limits  bilaterally. Muscle power within normal limits bilaterally.  Nails Thick disfigured discolored nails with subungual debris  Hallux  B/L. No evidence of bacterial infection or drainage bilaterally.  Orthopedic  No limitations of motion  feet .  No crepitus or effusions noted.  No bony pathology or digital deformities noted.  Midfoot  DJD  B/L.  Skin  normotropic skin with no porokeratosis noted bilaterally.  No signs of infections or ulcers noted.    Onychomycosis  B/L  Diabetes with no complications.  ROV.  Debride nails  X 2.  RTC 1 year.   Gardiner Barefoot DPM

## 2019-06-02 ENCOUNTER — Other Ambulatory Visit: Payer: Self-pay | Admitting: Family Medicine

## 2019-06-02 DIAGNOSIS — F329 Major depressive disorder, single episode, unspecified: Secondary | ICD-10-CM

## 2019-06-02 DIAGNOSIS — K219 Gastro-esophageal reflux disease without esophagitis: Secondary | ICD-10-CM

## 2019-06-02 DIAGNOSIS — F32A Depression, unspecified: Secondary | ICD-10-CM

## 2019-06-03 ENCOUNTER — Ambulatory Visit (INDEPENDENT_AMBULATORY_CARE_PROVIDER_SITE_OTHER)
Admission: RE | Admit: 2019-06-03 | Discharge: 2019-06-03 | Disposition: A | Discharge status: Home / Self Care | Payer: Medicare Other | Source: Ambulatory Visit | Attending: Acute Care | Admitting: Acute Care

## 2019-06-03 ENCOUNTER — Telehealth: Payer: Self-pay | Admitting: *Deleted

## 2019-06-03 ENCOUNTER — Other Ambulatory Visit: Payer: Self-pay

## 2019-06-03 DIAGNOSIS — Z87891 Personal history of nicotine dependence: Secondary | ICD-10-CM

## 2019-06-03 DIAGNOSIS — Z122 Encounter for screening for malignant neoplasm of respiratory organs: Secondary | ICD-10-CM

## 2019-06-03 MED ORDER — AMITRIPTYLINE HCL 25 MG PO TABS: 50.0000 mg | ORAL_TABLET | Freq: Every day | ORAL | 5 refills | Status: DC

## 2019-06-03 NOTE — Telephone Encounter (Signed)
Received fax from [facility] requesting amitriptyline refill. Refills sent.

## 2019-06-06 ENCOUNTER — Other Ambulatory Visit: Payer: Self-pay | Admitting: *Deleted

## 2019-06-06 DIAGNOSIS — Z87891 Personal history of nicotine dependence: Secondary | ICD-10-CM

## 2019-07-02 ENCOUNTER — Other Ambulatory Visit: Payer: Self-pay

## 2019-07-02 DIAGNOSIS — E1165 Type 2 diabetes mellitus with hyperglycemia: Secondary | ICD-10-CM

## 2019-07-02 DIAGNOSIS — IMO0002 Reserved for concepts with insufficient information to code with codable children: Secondary | ICD-10-CM

## 2019-07-02 DIAGNOSIS — E1151 Type 2 diabetes mellitus with diabetic peripheral angiopathy without gangrene: Secondary | ICD-10-CM

## 2019-07-02 MED ORDER — REPAGLINIDE 2 MG PO TABS: 2.0000 mg | ORAL_TABLET | Freq: Three times a day (TID) | ORAL | 0 refills | Status: DC

## 2019-07-09 ENCOUNTER — Other Ambulatory Visit: Payer: Self-pay

## 2019-07-11 ENCOUNTER — Other Ambulatory Visit: Payer: Self-pay

## 2019-07-11 ENCOUNTER — Ambulatory Visit (INDEPENDENT_AMBULATORY_CARE_PROVIDER_SITE_OTHER): Payer: Medicare Other | Admitting: Endocrinology

## 2019-07-11 ENCOUNTER — Encounter: Payer: Self-pay | Admitting: Endocrinology

## 2019-07-11 DIAGNOSIS — E1142 Type 2 diabetes mellitus with diabetic polyneuropathy: Secondary | ICD-10-CM | POA: Diagnosis not present

## 2019-07-11 NOTE — Patient Instructions (Addendum)
Please continue the same medications check your blood sugar once a day.  vary the time of day when you check, between before the 3 meals, and at bedtime.  also check if you have symptoms of your blood sugar being too high or too low.  please keep a record of the readings and bring it to your next appointment here (or you can bring the meter itself).  You can write it on any piece of paper.  please call us sooner if your blood sugar goes below 70, or if you have a lot of readings over 200.  Please come back for a follow-up appointment in 2 months.  

## 2019-07-11 NOTE — Progress Notes (Signed)
Subjective:    Patient ID: Alyssa Ford, female    DOB: Aug 14, 1950, 69 y.o.   MRN: [id number]  HPI  telehealth visit today via phone x 5 minutes.  Alternatives to telehealth are presented to this patient, and the patient agrees to the telehealth visit.  Pt is advised of the cost of the visit, and agrees to this, also.   Patient is at home, and I am at the office.   Persons attending the telehealth visit: the patient and I Pt returns for f/u of diabetes mellitus: DM type: 2 Dx'ed: [id number] Complications: polyneuropathy and CAD (seen on CT).   Therapy: 4 oral meds GDM: never DKA: never Severe hypoglycemia: never Pancreatitis: never Pancreatic imaging: never Other: she has never been on insulin; SDOH: she wants generic meds, if possible.   Interval history: Pt states cbg's vary from 110-160.  She takes meds as rx'ed.  pt states she feels well in general.   Pt also has MNG ([place] in 2020 showed Left lower pole nodule 1 has slightly enlarged compared with greater than 10 years ago. It meets criteria for annual follow-up).   Past Medical History:  Diagnosis Date  . Anemia   . Anxiety   . Arthritis   . Cataract   . Chronic headaches   . Depression   . Diabetes mellitus   . Diverticulosis of colon (without mention of hemorrhage) 2003, 2008   Colonoscopy  . GERD (gastroesophageal reflux disease)   . Glaucoma   . Hiatal hernia 2003   EGD   . Hyperlipidemia   . Hypertension   . Thyroid nodule     Past Surgical History:  Procedure Laterality Date  . CARPAL TUNNEL RELEASE  2006  . CARPAL TUNNEL RELEASE  12/24/2007  . CATARACT EXTRACTION, BILATERAL     [place]---  sept and oct 2018  . CERVICAL FUSION  3/08  . CHOLECYSTECTOMY    . FASCIECTOMY Left 08/01/2018   Procedure: FASCIECTOMY LEFT RING;  Surgeon: Daryll Brod, MD;  Location: [facility];  Service: Orthopedics;  Laterality: Left;  UPPER ARM  . TRIGGER FINGER RELEASE Left 08/01/2018   Procedure: RELEASE TRIGGER  FINGER/A-1 PULLEY LEFTRING;  Surgeon: Daryll Brod, MD;  Location: [facility];  Service: Orthopedics;  Laterality: Left;    Social History   Socioeconomic History  . Marital status: Married    Spouse name: Not on file  . Number of children: Not on file  . Years of education: Not on file  . Highest education level: Not on file  Occupational History  . Occupation: retired  Tobacco Use  . Smoking status: Former Smoker    Packs/day: 1.40    Years: 49.00    Pack years: 68.60    Types: Cigarettes    Quit date: 07/16/2015    Years since quitting: 3.9  . Smokeless tobacco: Never Used  . Tobacco comment: e cig  Substance and Sexual Activity  . Alcohol use: Yes    Alcohol/week: 0.0 standard drinks    Comment: once weekly; wine and beer  . Drug use: No  . Sexual activity: Yes    Partners: Male  Other Topics Concern  . Not on file  Social History Narrative   Exercise--  walk   Social Determinants of Health   Financial Resource Strain:   . Difficulty of Paying Living Expenses: Not on file  Food Insecurity:   . Worried About Charity fundraiser in the Last Year: Not on  file  . [place] in the Last Year: Not on file  Transportation Needs:   . Lack of Transportation (Medical): Not on file  . Lack of Transportation (Non-Medical): Not on file  Physical Activity:   . Days of Exercise per Week: Not on file  . Minutes of Exercise per Session: Not on file  Stress:   . Feeling of Stress : Not on file  Social Connections:   . Frequency of Communication with Friends and Family: Not on file  . Frequency of Social Gatherings with Friends and Family: Not on file  . Attends Religious Services: Not on file  . Active Member of Clubs or Organizations: Not on file  . Attends Archivist Meetings: Not on file  . Marital Status: Not on file  Intimate Partner Violence:   . Fear of Current or Ex-Partner: Not on file  . Emotionally Abused: Not on file  . Physically  Abused: Not on file  . Sexually Abused: Not on file    Current Outpatient Medications on File Prior to Visit  Medication Sig Dispense Refill  . acarbose (PRECOSE) 25 MG tablet Take 1 tablet (25 mg total) by mouth 3 (three) times daily with meals. 90 tablet 11  . amitriptyline (ELAVIL) 25 MG tablet Take 2 tablets (50 mg total) by mouth daily. 60 tablet 5  . aspirin EC 81 MG tablet Take 81 mg by mouth daily.    Marland Kitchen BIOTIN PO Take 1 tablet by mouth daily.     . bromocriptine (PARLODEL) 2.5 MG tablet Take 1 tablet (2.5 mg total) by mouth daily. 90 tablet 3  . buPROPion (WELLBUTRIN XL) 150 MG 24 hr tablet TAKE 2 TABLETS BY MOUTH ONCE EVERY MORNING 180 tablet 1  . Coenzyme Q10 (CO Q 10 PO) Take 1 capsule by mouth daily.    . diazepam (VALIUM) 5 MG tablet 1 po qhs prn 30 tablet 0  . Ferrous Sulfate (IRON SUPPLEMENT PO) Take 2 tablets by mouth daily.     . hydrochlorothiazide (HYDRODIURIL) 25 MG tablet TAKE 1 TABLET BY MOUTH ONCE DAILY 90 tablet 1  . latanoprost (XALATAN) 0.005 % ophthalmic solution Place 1 drop into both eyes at bedtime.     Marland Kitchen lisinopril (ZESTRIL) 5 MG tablet Take 1 tablet (5 mg total) by mouth daily. 90 tablet 3  . metFORMIN (GLUCOPHAGE-XR) 500 MG 24 hr tablet Take 4 tablets (2000 mg) daily with breakfast. 120 tablet 11  . Naproxen Sodium (ALEVE PO) Take 2 tablets by mouth daily.    Marland Kitchen omeprazole (PRILOSEC) 40 MG capsule TAKE 1 CAPSULE BY MOUTH ONCE DAILY 90 capsule 1  . repaglinide (PRANDIN) 2 MG tablet Take 1 tablet (2 mg total) by mouth 3 (three) times daily before meals. 270 tablet 0  . rosuvastatin (CRESTOR) 40 MG tablet Take 1 tablet (40 mg total) by mouth at bedtime. 90 tablet 1  . triamcinolone cream (KENALOG) 0.1 % Apply 1 application topically 2 (two) times daily. 30 g 0   No current facility-administered medications on file prior to visit.    Allergies  Allergen Reactions  . Codeine Nausea Only    Severe nausea    Family History  Problem Relation Age of Onset  .  Dementia Mother   . Breast cancer Mother 72  . Kidney failure Mother 67       died from kidney failure from abx given to her for pneumonia  . Hyperlipidemia Mother   . Hypertension Mother   .  Diabetes Mother   . Cancer Mother 26       breast  . Parkinsonism Father   . Diabetes Father 54  . Hypertension Sister   . Diabetes Brother   . Hypertension Brother   . Heart attack Brother   . Heart disease Cousin 37       mi  . Colon cancer Neg Hx     There were no vitals taken for this visit.   Review of Systems She denies hypoglycemia, weight change, and diarrhea.      Objective:   Physical Exam       Assessment & Plan:  Type 2 DM: well-controlled   Patient Instructions  Please continue the same medications check your blood sugar once a day.  vary the time of day when you check, between before the 3 meals, and at bedtime.  also check if you have symptoms of your blood sugar being too high or too low.  please keep a record of the readings and bring it to your next appointment here (or you can bring the meter itself).  You can write it on any piece of paper.  please call us sooner if your blood sugar goes below 70, or if you have a lot of readings over 200. Please come back for a follow-up appointment in 2 months.

## 2019-07-28 ENCOUNTER — Other Ambulatory Visit: Payer: Self-pay

## 2019-07-28 DIAGNOSIS — E1151 Type 2 diabetes mellitus with diabetic peripheral angiopathy without gangrene: Secondary | ICD-10-CM

## 2019-07-28 DIAGNOSIS — IMO0002 Reserved for concepts with insufficient information to code with codable children: Secondary | ICD-10-CM

## 2019-07-28 DIAGNOSIS — E1165 Type 2 diabetes mellitus with hyperglycemia: Secondary | ICD-10-CM

## 2019-07-28 MED ORDER — METFORMIN HCL ER 500 MG PO TB24: 2000.0000 mg | ORAL_TABLET | Freq: Every day | ORAL | 0 refills | Status: DC

## 2019-08-05 ENCOUNTER — Ambulatory Visit
Admission: RE | Admit: 2019-08-05 | Discharge: 2019-08-05 | Disposition: A | Discharge status: Home / Self Care | Payer: Medicare Other | Source: Ambulatory Visit | Attending: Family Medicine | Admitting: Family Medicine

## 2019-08-05 ENCOUNTER — Other Ambulatory Visit: Payer: Self-pay

## 2019-08-05 DIAGNOSIS — Z780 Asymptomatic menopausal state: Secondary | ICD-10-CM | POA: Diagnosis not present

## 2019-08-05 DIAGNOSIS — Z1231 Encounter for screening mammogram for malignant neoplasm of breast: Secondary | ICD-10-CM | POA: Diagnosis not present

## 2019-08-05 DIAGNOSIS — E2839 Other primary ovarian failure: Secondary | ICD-10-CM

## 2019-08-07 ENCOUNTER — Encounter: Payer: Self-pay | Admitting: Family Medicine

## 2019-08-07 ENCOUNTER — Other Ambulatory Visit: Payer: Self-pay | Admitting: *Deleted

## 2019-08-07 MED ORDER — AMITRIPTYLINE HCL 25 MG PO TABS: 50.0000 mg | ORAL_TABLET | Freq: Every day | ORAL | 1 refills | Status: DC

## 2019-08-07 NOTE — Telephone Encounter (Signed)
See labs 

## 2019-08-20 ENCOUNTER — Telehealth: Payer: Self-pay | Admitting: *Deleted

## 2019-08-20 NOTE — Telephone Encounter (Signed)
For diazepam.  Sorry about that.

## 2019-08-20 NOTE — Telephone Encounter (Signed)
Last written: 02/10/19 Last ov: 04/17/19 Next ov: NONE Contract: DUE UDS: DUE

## 2019-08-20 NOTE — Telephone Encounter (Signed)
What med?

## 2019-08-21 ENCOUNTER — Other Ambulatory Visit: Payer: Self-pay | Admitting: Family Medicine

## 2019-08-21 DIAGNOSIS — F411 Generalized anxiety disorder: Secondary | ICD-10-CM

## 2019-08-21 MED ORDER — DIAZEPAM 5 MG PO TABS: ORAL_TABLET | ORAL | 0 refills | Status: DC

## 2019-09-05 ENCOUNTER — Other Ambulatory Visit: Payer: Self-pay

## 2019-09-08 ENCOUNTER — Ambulatory Visit: Payer: Medicare Other | Admitting: Podiatry

## 2019-09-09 ENCOUNTER — Other Ambulatory Visit: Payer: Self-pay

## 2019-09-09 ENCOUNTER — Ambulatory Visit: Payer: Medicare Other | Admitting: Endocrinology

## 2019-09-09 ENCOUNTER — Encounter: Payer: Self-pay | Admitting: Endocrinology

## 2019-09-09 VITALS — BP 102/70 | HR 78 | Ht 66.0 in | Wt 158.2 lb

## 2019-09-09 DIAGNOSIS — E1151 Type 2 diabetes mellitus with diabetic peripheral angiopathy without gangrene: Secondary | ICD-10-CM

## 2019-09-09 DIAGNOSIS — E041 Nontoxic single thyroid nodule: Secondary | ICD-10-CM | POA: Diagnosis not present

## 2019-09-09 DIAGNOSIS — E1165 Type 2 diabetes mellitus with hyperglycemia: Secondary | ICD-10-CM | POA: Diagnosis not present

## 2019-09-09 DIAGNOSIS — IMO0002 Reserved for concepts with insufficient information to code with codable children: Secondary | ICD-10-CM

## 2019-09-09 LAB — POCT GLYCOSYLATED HEMOGLOBIN (HGB A1C): Hemoglobin A1C: 6.4 % — AB (ref 4.0–5.6)

## 2019-09-09 NOTE — Progress Notes (Signed)
Subjective:    Patient ID: Alyssa Ford, female    DOB: Jun 28, 1950, 69 y.o.   MRN: [id number]  HPI Pt returns for f/u of diabetes mellitus: DM type: 2 Dx'ed: [id number] Complications: polyneuropathy and CAD (seen on CT).   Therapy: 4 oral meds GDM: never DKA: never Severe hypoglycemia: never Pancreatitis: never Pancreatic imaging: never Other: she has never been on insulin; SDOH: she wants generic meds, if possible.   Interval history: Pt states cbg's vary from 110-160.  She takes meds as rx'ed.  pt states she feels well in general.   Pt also has MNG ([place] in 2020 showed Left lower pole nodule 1 has slightly enlarged compared with greater than 2010; CT 9/20 was little changed).   Past Medical History:  Diagnosis Date  . Anemia   . Anxiety   . Arthritis   . Cataract   . Chronic headaches   . Depression   . Diabetes mellitus   . Diverticulosis of colon (without mention of hemorrhage) 2003, 2008   Colonoscopy  . GERD (gastroesophageal reflux disease)   . Glaucoma   . Hiatal hernia 2003   EGD   . Hyperlipidemia   . Hypertension   . Thyroid nodule     Past Surgical History:  Procedure Laterality Date  . CARPAL TUNNEL RELEASE  2006  . CARPAL TUNNEL RELEASE  12/24/2007  . CATARACT EXTRACTION, BILATERAL     [place]---  sept and oct 2018  . CERVICAL FUSION  3/08  . CHOLECYSTECTOMY    . FASCIECTOMY Left 08/01/2018   Procedure: FASCIECTOMY LEFT RING;  Surgeon: Daryll Brod, MD;  Location: [facility];  Service: Orthopedics;  Laterality: Left;  UPPER ARM  . TRIGGER FINGER RELEASE Left 08/01/2018   Procedure: RELEASE TRIGGER FINGER/A-1 PULLEY LEFTRING;  Surgeon: Daryll Brod, MD;  Location: [place];  Service: Orthopedics;  Laterality: Left;    Social History   Socioeconomic History  . Marital status: Married    Spouse name: Not on file  . Number of children: Not on file  . Years of education: Not on file  . Highest education level: Not on file   Occupational History  . Occupation: retired  Tobacco Use  . Smoking status: Former Smoker    Packs/day: 1.40    Years: 49.00    Pack years: 68.60    Types: Cigarettes    Quit date: 07/16/2015    Years since quitting: 4.1  . Smokeless tobacco: Never Used  . Tobacco comment: e cig  Substance and Sexual Activity  . Alcohol use: Yes    Alcohol/week: 0.0 standard drinks    Comment: once weekly; wine and beer  . Drug use: No  . Sexual activity: Yes    Partners: Male  Other Topics Concern  . Not on file  Social History Narrative   Exercise--  walk   Social Determinants of Health   Financial Resource Strain:   . Difficulty of Paying Living Expenses:   Food Insecurity:   . Worried About Charity fundraiser in the Last Year:   . Arboriculturist in the Last Year:   Transportation Needs:   . Film/video editor (Medical):   Marland Kitchen Lack of Transportation (Non-Medical):   Physical Activity:   . Days of Exercise per Week:   . Minutes of Exercise per Session:   Stress:   . Feeling of Stress :   Social Connections:   . Frequency of Communication with Friends  and Family:   . Frequency of Social Gatherings with Friends and Family:   . Attends Religious Services:   . Active Member of Clubs or Organizations:   . Attends Archivist Meetings:   Marland Kitchen Marital Status:   Intimate Partner Violence:   . Fear of Current or Ex-Partner:   . Emotionally Abused:   Marland Kitchen Physically Abused:   . Sexually Abused:     Current Outpatient Medications on File Prior to Visit  Medication Sig Dispense Refill  . acarbose (PRECOSE) 25 MG tablet Take 1 tablet (25 mg total) by mouth 3 (three) times daily with meals. 90 tablet 11  . amitriptyline (ELAVIL) 25 MG tablet Take 2 tablets (50 mg total) by mouth daily. 180 tablet 1  . aspirin EC 81 MG tablet Take 81 mg by mouth daily.    Marland Kitchen BIOTIN PO Take 1 tablet by mouth daily.     . bromocriptine (PARLODEL) 2.5 MG tablet Take 1 tablet (2.5 mg total) by mouth  daily. 90 tablet 3  . buPROPion (WELLBUTRIN XL) 150 MG 24 hr tablet TAKE 2 TABLETS BY MOUTH ONCE EVERY MORNING 180 tablet 1  . Coenzyme Q10 (CO Q 10 PO) Take 1 capsule by mouth daily.    . diazepam (VALIUM) 5 MG tablet 1 po qhs prn 30 tablet 0  . Ferrous Sulfate (IRON SUPPLEMENT PO) Take 2 tablets by mouth daily.     . hydrochlorothiazide (HYDRODIURIL) 25 MG tablet TAKE 1 TABLET BY MOUTH ONCE DAILY 90 tablet 1  . latanoprost (XALATAN) 0.005 % ophthalmic solution Place 1 drop into both eyes at bedtime.     Marland Kitchen lisinopril (ZESTRIL) 5 MG tablet Take 1 tablet (5 mg total) by mouth daily. 90 tablet 3  . metFORMIN (GLUCOPHAGE-XR) 500 MG 24 hr tablet Take 4 tablets (2,000 mg total) by mouth daily with breakfast. Take 4 tablets (2000 mg) daily with breakfast. 360 tablet 0  . Naproxen Sodium (ALEVE PO) Take 2 tablets by mouth daily.    Marland Kitchen omeprazole (PRILOSEC) 40 MG capsule TAKE 1 CAPSULE BY MOUTH ONCE DAILY 90 capsule 1  . repaglinide (PRANDIN) 2 MG tablet Take 1 tablet (2 mg total) by mouth 3 (three) times daily before meals. 270 tablet 0  . rosuvastatin (CRESTOR) 40 MG tablet Take 1 tablet (40 mg total) by mouth at bedtime. 90 tablet 1  . triamcinolone cream (KENALOG) 0.1 % Apply 1 application topically 2 (two) times daily. 30 g 0   No current facility-administered medications on file prior to visit.    Allergies  Allergen Reactions  . Codeine Nausea Only    Severe nausea    Family History  Problem Relation Age of Onset  . Dementia Mother   . Breast cancer Mother 95  . Kidney failure Mother 50       died from kidney failure from abx given to her for pneumonia  . Hyperlipidemia Mother   . Hypertension Mother   . Diabetes Mother   . Cancer Mother 62       breast  . Parkinsonism Father   . Diabetes Father 88  . Hypertension Sister   . Diabetes Brother   . Hypertension Brother   . Heart attack Brother   . Heart disease Cousin 46       mi  . Colon cancer Neg Hx     BP 102/70 (BP  Location: Left Arm, Patient Position: Sitting, Cuff Size: Large)   Pulse 78   Ht 5\' 6"  (1.676  m)   Wt 158 lb 3.2 oz (71.8 kg)   SpO2 97%   BMI 25.53 kg/m   Review of Systems     Objective:   Physical Exam VITAL SIGNS:  See vs page GENERAL: no distress Pulses: dorsalis pedis intact bilat.   MSK: no deformity of the feet CV: no leg edema Skin:  no ulcer on the feet.  normal color and temp on the feet. Neuro: sensation is intact to touch on the feet  Lab Results  Component Value Date   TSH 4.23 07/08/2018     Lab Results  Component Value Date   HGBA1C 6.4 (A) 09/09/2019       Assessment & Plan:  MNG: stable on late 2020 CT Type 2 DM: well-controlled HTN: overcontrolled.   Patient Instructions  Please continue the same medications. We can skip the thyroid ultrasound for now. Your blood pressure is borderline low today.  Please continue to see Dr Etter Sjogren for this, to have it rechecked.  check your blood sugar once a day.  vary the time of day when you check, between before the 3 meals, and at bedtime.  also check if you have symptoms of your blood sugar being too high or too low.  please keep a record of the readings and bring it to your next appointment here (or you can bring the meter itself).  You can write it on any piece of paper.  please call us sooner if your blood sugar goes below 70, or if you have a lot of readings over 200. Please come back for a follow-up appointment in 4 months.

## 2019-09-09 NOTE — Patient Instructions (Addendum)
Please continue the same medications. We can skip the thyroid ultrasound for now. Your blood pressure is borderline low today.  Please continue to see Dr Etter Sjogren for this, to have it rechecked.  check your blood sugar once a day.  vary the time of day when you check, between before the 3 meals, and at bedtime.  also check if you have symptoms of your blood sugar being too high or too low.  please keep a record of the readings and bring it to your next appointment here (or you can bring the meter itself).  You can write it on any piece of paper.  please call us sooner if your blood sugar goes below 70, or if you have a lot of readings over 200. Please come back for a follow-up appointment in 4 months.

## 2019-09-18 ENCOUNTER — Other Ambulatory Visit: Payer: Self-pay

## 2019-09-19 ENCOUNTER — Other Ambulatory Visit: Payer: Self-pay

## 2019-09-22 ENCOUNTER — Encounter: Payer: Self-pay | Admitting: Family Medicine

## 2019-09-22 ENCOUNTER — Ambulatory Visit (INDEPENDENT_AMBULATORY_CARE_PROVIDER_SITE_OTHER): Payer: Medicare Other | Admitting: Family Medicine

## 2019-09-22 ENCOUNTER — Other Ambulatory Visit: Payer: Self-pay

## 2019-09-22 VITALS — BP 110/68 | HR 75 | Temp 97.9°F | Resp 18 | Ht 66.0 in | Wt 159.8 lb

## 2019-09-22 DIAGNOSIS — E785 Hyperlipidemia, unspecified: Secondary | ICD-10-CM

## 2019-09-22 DIAGNOSIS — M654 Radial styloid tenosynovitis [de Quervain]: Secondary | ICD-10-CM

## 2019-09-22 DIAGNOSIS — I10 Essential (primary) hypertension: Secondary | ICD-10-CM

## 2019-09-22 DIAGNOSIS — E1165 Type 2 diabetes mellitus with hyperglycemia: Secondary | ICD-10-CM | POA: Insufficient documentation

## 2019-09-22 DIAGNOSIS — E1169 Type 2 diabetes mellitus with other specified complication: Secondary | ICD-10-CM | POA: Diagnosis not present

## 2019-09-22 DIAGNOSIS — D729 Disorder of white blood cells, unspecified: Secondary | ICD-10-CM

## 2019-09-22 LAB — COMPREHENSIVE METABOLIC PANEL
ALT: 14 U/L (ref 0–35)
AST: 12 U/L (ref 0–37)
Albumin: 4.2 g/dL (ref 3.5–5.2)
Alkaline Phosphatase: 80 U/L (ref 39–117)
BUN: 20 mg/dL (ref 6–23)
CO2: 27 mEq/L (ref 19–32)
Calcium: 8.9 mg/dL (ref 8.4–10.5)
Chloride: 98 mEq/L (ref 96–112)
Creatinine, Ser: 0.77 mg/dL (ref 0.40–1.20)
GFR: 74.22 mL/min (ref >60.00)
Glucose, Bld: 211 mg/dL — ABNORMAL HIGH (ref 70–99)
Potassium: 3.6 mEq/L (ref 3.5–5.1)
Sodium: 135 mEq/L (ref 135–145)
Total Bilirubin: 0.3 mg/dL (ref 0.2–1.2)
Total Protein: 6.5 g/dL (ref 6.0–8.3)

## 2019-09-22 LAB — LIPID PANEL
Cholesterol: 125 mg/dL (ref 0–200)
HDL: 41.7 mg/dL (ref >39.00)
LDL Cholesterol: 59 mg/dL (ref 0–99)
NonHDL: 83.45
Total CHOL/HDL Ratio: 3
Triglycerides: 123 mg/dL (ref 0.0–149.0)
VLDL: 24.6 mg/dL (ref 0.0–40.0)

## 2019-09-22 LAB — CBC WITH DIFFERENTIAL/PLATELET
Basophils Absolute: 0 10*3/uL (ref 0.0–0.1)
Basophils Relative: 0.3 % (ref 0.0–3.0)
Eosinophils Absolute: 0.1 10*3/uL (ref 0.0–0.7)
Eosinophils Relative: 1.1 % (ref 0.0–5.0)
HCT: 36.5 % (ref 36.0–46.0)
Hemoglobin: 12.3 g/dL (ref 12.0–15.0)
Lymphocytes Relative: 22.2 % (ref 12.0–46.0)
Lymphs Abs: 2.1 10*3/uL (ref 0.7–4.0)
MCHC: 33.9 g/dL (ref 30.0–36.0)
MCV: 88.1 fl (ref 78.0–100.0)
Monocytes Absolute: 0.6 10*3/uL (ref 0.1–1.0)
Monocytes Relative: 6.1 % (ref 3.0–12.0)
Neutro Abs: 6.8 10*3/uL (ref 1.4–7.7)
Neutrophils Relative %: 70.3 % (ref 43.0–77.0)
Platelets: 260 10*3/uL (ref 150.0–400.0)
RBC: 4.14 Mil/uL (ref 3.87–5.11)
RDW: 14.5 % (ref 11.5–15.5)
WBC: 9.6 10*3/uL (ref 4.0–10.5)

## 2019-09-22 LAB — HEMOGLOBIN A1C: Hgb A1c MFr Bld: 6.9 % — ABNORMAL HIGH (ref 4.6–6.5)

## 2019-09-22 NOTE — Assessment & Plan Note (Signed)
Repeat labs today

## 2019-09-22 NOTE — Patient Instructions (Signed)

## 2019-09-22 NOTE — Assessment & Plan Note (Signed)
Well controlled, no changes to meds. Encouraged heart healthy diet such as the DASH diet and exercise as tolerated.  °

## 2019-09-22 NOTE — Progress Notes (Signed)
Patient ID: Alyssa Ford, female    DOB: Apr 25, 1951  Age: 69 y.o. MRN: [id number]    Subjective:  Subjective  HPI Alyssa Ford presents for f/u bp-- bp running low.   She has started exercising again.  She also c/o had bothering her again-- she had surgery for trigger finger and de quervains   Review of Systems  Constitutional: Negative for appetite change, diaphoresis, fatigue and unexpected weight change.  Eyes: Negative for pain, redness and visual disturbance.  Respiratory: Negative for cough, chest tightness, shortness of breath and wheezing.   Cardiovascular: Negative for chest pain, palpitations and leg swelling.  Endocrine: Negative for cold intolerance, heat intolerance, polydipsia, polyphagia and polyuria.  Genitourinary: Negative for difficulty urinating, dysuria and frequency.  Neurological: Negative for dizziness, light-headedness, numbness and headaches.    History Past Medical History:  Diagnosis Date  . Anemia   . Anxiety   . Arthritis   . Cataract   . Chronic headaches   . Depression   . Diabetes mellitus   . Diverticulosis of colon (without mention of hemorrhage) 2003, 2008   Colonoscopy  . GERD (gastroesophageal reflux disease)   . Glaucoma   . Hiatal hernia 2003   EGD   . Hyperlipidemia   . Hypertension   . Thyroid nodule     She has a past surgical history that includes Cholecystectomy; Carpal tunnel release (2006); Carpal tunnel release (12/24/2007); Cervical fusion (3/08); Cataract extraction, bilateral; Fasciectomy (Left, 08/01/2018); and Trigger finger release (Left, 08/01/2018).   Her family history includes Breast cancer (age of onset: 84) in her mother; Cancer (age of onset: 25) in her mother; Dementia in her mother; Diabetes in her brother and mother; Diabetes (age of onset: 77) in her father; Heart attack in her brother; Heart disease (age of onset: 51) in her cousin; Hyperlipidemia in her mother; Hypertension in her brother, mother, and sister;  Kidney failure (age of onset: 47) in her mother; Parkinsonism in her father.She reports that she quit smoking about 4 years ago. Her smoking use included cigarettes. She has a 68.60 pack-year smoking history. She has never used smokeless tobacco. She reports current alcohol use. She reports that she does not use drugs.  Current Outpatient Medications on File Prior to Visit  Medication Sig Dispense Refill  . acarbose (PRECOSE) 25 MG tablet Take 1 tablet (25 mg total) by mouth 3 (three) times daily with meals. 90 tablet 11  . amitriptyline (ELAVIL) 25 MG tablet Take 2 tablets (50 mg total) by mouth daily. 180 tablet 1  . aspirin EC 81 MG tablet Take 81 mg by mouth daily.    Marland Kitchen BIOTIN PO Take 1 tablet by mouth daily.     . bromocriptine (PARLODEL) 2.5 MG tablet Take 1 tablet (2.5 mg total) by mouth daily. 90 tablet 3  . buPROPion (WELLBUTRIN XL) 150 MG 24 hr tablet TAKE 2 TABLETS BY MOUTH ONCE EVERY MORNING 180 tablet 1  . Coenzyme Q10 (CO Q 10 PO) Take 1 capsule by mouth daily.    . diazepam (VALIUM) 5 MG tablet 1 po qhs prn 30 tablet 0  . Ferrous Sulfate (IRON SUPPLEMENT PO) Take 2 tablets by mouth daily.     Marland Kitchen latanoprost (XALATAN) 0.005 % ophthalmic solution Place 1 drop into both eyes at bedtime.     Marland Kitchen lisinopril (ZESTRIL) 5 MG tablet Take 1 tablet (5 mg total) by mouth daily. 90 tablet 3  . metFORMIN (GLUCOPHAGE-XR) 500 MG 24 hr tablet Take 4  tablets (2,000 mg total) by mouth daily with breakfast. Take 4 tablets (2000 mg) daily with breakfast. 360 tablet 0  . Naproxen Sodium (ALEVE PO) Take 2 tablets by mouth daily.    Marland Kitchen omeprazole (PRILOSEC) 40 MG capsule TAKE 1 CAPSULE BY MOUTH ONCE DAILY 90 capsule 1  . repaglinide (PRANDIN) 2 MG tablet Take 1 tablet (2 mg total) by mouth 3 (three) times daily before meals. 270 tablet 0  . rosuvastatin (CRESTOR) 40 MG tablet Take 1 tablet (40 mg total) by mouth at bedtime. 90 tablet 1  . triamcinolone cream (KENALOG) 0.1 % Apply 1 application topically 2  (two) times daily. 30 g 0   No current facility-administered medications on file prior to visit.     Objective:  Objective  Physical Exam Vitals and nursing note reviewed.  Constitutional:      Appearance: She is well-developed.  HENT:     Head: Normocephalic and atraumatic.  Eyes:     Conjunctiva/sclera: Conjunctivae normal.  Neck:     Thyroid: No thyromegaly.     Vascular: No carotid bruit or JVD.  Cardiovascular:     Rate and Rhythm: Normal rate and regular rhythm.     Heart sounds: Normal heart sounds. No murmur.  Pulmonary:     Effort: Pulmonary effort is normal. No respiratory distress.     Breath sounds: Normal breath sounds. No wheezing or rales.  Chest:     Chest wall: No tenderness.  Musculoskeletal:     Cervical back: Normal range of motion and neck supple.  Neurological:     Mental Status: She is alert and oriented to person, place, and time.    BP 110/68 (BP Location: Left Arm, Patient Position: Sitting, Cuff Size: Normal)   Pulse 75   Temp 97.9 F (36.6 C) (Temporal)   Resp 18   Ht 5\' 6"  (1.676 m)   Wt 159 lb 12.8 oz (72.5 kg)   SpO2 98%   BMI 25.79 kg/m  Wt Readings from Last 3 Encounters:  09/22/19 159 lb 12.8 oz (72.5 kg)  09/09/19 158 lb 3.2 oz (71.8 kg)  04/17/19 161 lb 6.4 oz (73.2 kg)     Lab Results  Component Value Date   WBC 9.1 04/17/2019   HGB 12.6 04/17/2019   HCT 37.4 04/17/2019   PLT 258.0 04/17/2019   GLUCOSE 157 (H) 04/17/2019   CHOL 134 04/17/2019   TRIG 154.0 (H) 04/17/2019   HDL 46.10 04/17/2019   LDLDIRECT 103.0 04/08/2018   LDLCALC 57 04/17/2019   ALT 18 04/17/2019   AST 14 04/17/2019   NA 136 04/17/2019   K 3.8 04/17/2019   CL 92 (L) 04/17/2019   CREATININE 0.81 04/17/2019   BUN 13 04/17/2019   CO2 33 (H) 04/17/2019   TSH 4.23 07/08/2018   HGBA1C 6.4 (A) 09/09/2019   MICROALBUR 1.1 04/17/2019    DG Bone Density  Result Date: 08/05/2019 EXAM: DUAL X-RAY ABSORPTIOMETRY (DXA) FOR BONE MINERAL DENSITY  IMPRESSION: Referring Physician:  Rosalita Chessman Ford Your patient completed a BMD test using Lunar IDXA DXA system ( analysis version: 16 ) manufactured by [employer]. Technologist: AW PATIENT: Name: Alyssa Ford, Alyssa Ford Patient ID: [id number] Birth Date: 1950/12/06 Height: 66.0 in. Sex: Female Measured: 08/05/2019 Weight: 160.4 lbs. Indications: Caucasian, Estrogen Deficient, Postmenopausal Fractures: nose, Toe Treatments: None ASSESSMENT: The BMD measured at Forearm Radius 33% is 0.869 g/cm2 with a T-score of -0.2. This patient is considered normal according to [facility]) criteria.  Compared with the prior study on 04/04/2017 the BMD of the total mean hip shows no statistically significant change. The scan quality is good. Lumbar spine was not utilized due to advanced degenerative changes. Site Region Measured Date Measured Age YA BMD Significant CHANGE T-score DualFemur Neck Left 08/05/2019 69.3 [age] 1.036 g/cm2 * DualFemur Neck Left 04/04/2017 67.0 0.4 1.104 g/cm2 DualFemur Total Mean 08/05/2019 69.3 1.1 1.132 g/cm2 DualFemur Total Mean 04/04/2017 67.0 1.1 1.157 g/cm2 Left Forearm Radius 33% 08/05/2019 69.3 -0.2 0.869 g/cm2 Left Forearm Radius 33% 04/04/2017 67.0 -0.5 0.841 g/cm2 World Health Organization [facility]) criteria for post-menopausal, Caucasian Women: Normal       T-score at or above -1 SD Osteopenia   T-score between -1 and -2.5 SD Osteoporosis T-score at or below -2.5 SD RECOMMENDATION: 1. All patients should optimize calcium and vitamin D intake. 2. Consider FDA approved medical therapies in postmenopausal women and men aged 42 years and older, based on the following: a. A hip or vertebral (clinical or morphometric) fracture b. T- score < or = -2.5 at the femoral neck or spine after appropriate evaluation to exclude secondary causes c. Low bone mass (T-score between -1.0 and -2.5 at the femoral neck or spine) and a 10 year probability of a hip fracture > or = 3% or a 10 year  probability of a major osteoporosis-related fracture > or = 20% based on the [place]-adapted WHO algorithm d. Clinician judgment and/or patient preferences may indicate treatment for people with 10-year fracture probabilities above or below these levels FOLLOW-UP: Patients with diagnosis of osteoporosis or at high risk for fracture should have regular bone mineral density tests. For patients eligible for Medicare, routine testing is allowed once every 2 years. The testing frequency can be increased to one year for patients who have rapidly progressing disease, those who are receiving or discontinuing medical therapy to restore bone mass, or have additional risk factors. I have reviewed this report and agree with the above findings. [facility] Radiology Electronically Signed   By: Lowella Grip III M.D.   On: 08/05/2019 11:43   MM 3D SCREEN BREAST BILATERAL  Result Date: 08/07/2019 CLINICAL DATA:  Screening. EXAM: DIGITAL SCREENING BILATERAL MAMMOGRAM WITH TOMO AND CAD COMPARISON:  Previous exam(s). ACR Breast Density Category b: There are scattered areas of fibroglandular density. FINDINGS: There are no findings suspicious for malignancy. Images were processed with CAD. IMPRESSION: No mammographic evidence of malignancy. A result letter of this screening mammogram will be mailed directly to the patient. RECOMMENDATION: Screening mammogram in one year. (Code:SM-B-01Y) BI-RADS CATEGORY  1: Negative. Electronically Signed   By: Claudie Revering M.D.   On: 08/07/2019 16:55     Assessment & Plan:  Plan  I have discontinued Miley H. Hodak's hydrochlorothiazide. I am also having her maintain her BIOTIN PO, Naproxen Sodium (ALEVE PO), latanoprost, Ferrous Sulfate (IRON SUPPLEMENT PO), aspirin EC, Coenzyme Q10 (CO Q 10 PO), bromocriptine, triamcinolone cream, acarbose, lisinopril, rosuvastatin, omeprazole, buPROPion, repaglinide, metFORMIN, amitriptyline, and diazepam.  No orders of the defined types were placed in  this encounter.   Problem List Items Addressed This Visit      Unprioritized   Abnormal WBC count    Repeat labs today      Relevant Orders   CBC with Differential/Platelet   Tennis Must Quervain's disease (tenosynovitis)    S/p surgery 1 year ago--- f/u hand surgeon       Essential hypertension - Primary    Well controlled, no changes to meds. Encouraged heart  healthy diet such as the DASH diet and exercise as tolerated.       Relevant Orders   Lipid panel   Comprehensive metabolic panel   Hyperlipidemia LDL goal <70    Encouraged heart healthy diet, increase exercise, avoid trans fats, consider a krill oil cap daily      Uncontrolled type 2 diabetes mellitus with hyperglycemia ([place])    Check labs hgba1c to be checked, minimize simple carbs. Increase exercise as tolerated. Continue current meds       Relevant Orders   Hemoglobin A1c    Other Visit Diagnoses    Hyperlipidemia associated with type 2 diabetes mellitus ([place])       Relevant Orders   Lipid panel   Comprehensive metabolic panel      Follow-up: Return in about 6 months (around 03/24/2020) for annual exam, fasting.  Ann Held, DO

## 2019-09-22 NOTE — Assessment & Plan Note (Signed)
S/p surgery 1 year ago--- f/u hand surgeon

## 2019-09-22 NOTE — Assessment & Plan Note (Signed)
Check labs  hgba1c to be checked, minimize simple carbs. Increase exercise as tolerated. Continue current meds  

## 2019-09-22 NOTE — Assessment & Plan Note (Signed)
Encouraged heart healthy diet, increase exercise, avoid trans fats, consider a krill oil cap daily 

## 2019-09-24 DIAGNOSIS — M79642 Pain in left hand: Secondary | ICD-10-CM | POA: Diagnosis not present

## 2019-09-27 ENCOUNTER — Other Ambulatory Visit: Payer: Self-pay | Admitting: Endocrinology

## 2019-09-27 DIAGNOSIS — E1165 Type 2 diabetes mellitus with hyperglycemia: Secondary | ICD-10-CM

## 2019-09-27 DIAGNOSIS — IMO0002 Reserved for concepts with insufficient information to code with codable children: Secondary | ICD-10-CM

## 2019-09-27 DIAGNOSIS — E1151 Type 2 diabetes mellitus with diabetic peripheral angiopathy without gangrene: Secondary | ICD-10-CM

## 2019-09-29 ENCOUNTER — Telehealth: Payer: Self-pay | Admitting: Family Medicine

## 2019-09-29 NOTE — Chronic Care Management (AMB) (Signed)
  Chronic Care Management   Note  09/29/2019 Name: Alyssa Ford MRN: [id number] DOB: 1950/10/21  Alyssa Ford is a 69 y.o. year old female who is a primary care patient of Ann Held, DO. I reached out to [employer] by phone today in response to a referral sent by Alyssa Ford's PCP, Alyssa Ford, Alyssa Apa, DO.   Alyssa Ford was given information about Chronic Care Management services today including:  1. CCM service includes personalized support from designated clinical staff supervised by her physician, including individualized plan of care and coordination with other care providers 2. 24/7 contact phone numbers for assistance for urgent and routine care needs. 3. Service will only be billed when office clinical staff spend 20 minutes or more in a month to coordinate care. 4. Only one practitioner may furnish and bill the service in a calendar month. 5. The patient may stop CCM services at any time (effective at the end of the month) by phone call to the office staff.   Patient agreed to services and verbal consent obtained.   Follow up plan:   Alyssa Ford [employer] Scheduler

## 2019-10-17 ENCOUNTER — Other Ambulatory Visit: Payer: Self-pay | Admitting: Endocrinology

## 2019-10-17 DIAGNOSIS — E1151 Type 2 diabetes mellitus with diabetic peripheral angiopathy without gangrene: Secondary | ICD-10-CM

## 2019-10-17 DIAGNOSIS — IMO0002 Reserved for concepts with insufficient information to code with codable children: Secondary | ICD-10-CM

## 2019-10-17 DIAGNOSIS — E1165 Type 2 diabetes mellitus with hyperglycemia: Secondary | ICD-10-CM

## 2019-10-22 ENCOUNTER — Other Ambulatory Visit: Payer: Self-pay

## 2019-10-22 DIAGNOSIS — E1169 Type 2 diabetes mellitus with other specified complication: Secondary | ICD-10-CM

## 2019-10-22 DIAGNOSIS — I10 Essential (primary) hypertension: Secondary | ICD-10-CM

## 2019-10-22 DIAGNOSIS — E1165 Type 2 diabetes mellitus with hyperglycemia: Secondary | ICD-10-CM

## 2019-10-23 ENCOUNTER — Other Ambulatory Visit: Payer: Self-pay

## 2019-10-23 ENCOUNTER — Ambulatory Visit: Payer: Medicare Other | Admitting: Pharmacist

## 2019-10-23 DIAGNOSIS — I10 Essential (primary) hypertension: Secondary | ICD-10-CM

## 2019-10-23 DIAGNOSIS — IMO0002 Reserved for concepts with insufficient information to code with codable children: Secondary | ICD-10-CM

## 2019-10-23 DIAGNOSIS — E1165 Type 2 diabetes mellitus with hyperglycemia: Secondary | ICD-10-CM

## 2019-10-23 DIAGNOSIS — E785 Hyperlipidemia, unspecified: Secondary | ICD-10-CM

## 2019-10-23 DIAGNOSIS — E1151 Type 2 diabetes mellitus with diabetic peripheral angiopathy without gangrene: Secondary | ICD-10-CM

## 2019-10-23 NOTE — Chronic Care Management (AMB) (Signed)
Chronic Care Management Pharmacy  Name: Alyssa Ford  MRN: [id number] DOB: 1950-09-30  Chief Complaint/ HPI  Alyssa Ford,  69 y.o. , female presents for their Initial CCM visit with the clinical pharmacist via telephone due to COVID-19 Pandemic.  PCP : Alyssa Held, DO  Their chronic conditions include: Diabetes, Hypertension, Hyperlipidemia, Nerve Pain, Anxiety, Glaucoma, GERD, Insomnia  Office Visits: 09/22/2019: Visit w/ Dr. Etter Ford - Labs ordered. Discontinued hctz (BP running low)  Consult Visit: 09/24/19: Ortho visit w/ Dr. Fredna Ford - Left hand pain. XRays show arthritis. Treated with medrol dose pak and aleve. RTC in 4-6 weeks.   09/09/19: Endo visit w/ Dr. Loanne Ford - DM ("she wants generic meds if possible"). No med changes noted.   07/11/19: Endo visit w/ Dr. Loanne Ford - DM. No med changes noted.  05/27/19: Podiatry visit w/ Dr. Prudence Ford - Debrided nails. RTC in 1 year.  05/05/19: Ophthalmology visit w/ Dr. Eulas Ford - Primary open angle glaucoma  Medications: Outpatient Encounter Medications as of 10/23/2019  Medication Sig Note  . acarbose (PRECOSE) 25 MG tablet Take 1 tablet (25 mg total) by mouth 3 (three) times daily with meals.   Marland Kitchen amitriptyline (ELAVIL) 25 MG tablet Take 2 tablets (50 mg total) by mouth daily.   Marland Kitchen aspirin EC 81 MG tablet Take 81 mg by mouth daily.   Marland Kitchen BIOTIN PO Take 1 tablet by mouth daily.    . bromocriptine (PARLODEL) 2.5 MG tablet Take 1 tablet (2.5 mg total) by mouth daily.   Marland Kitchen buPROPion (WELLBUTRIN XL) 150 MG 24 hr tablet TAKE 2 TABLETS BY MOUTH ONCE EVERY MORNING   . Coenzyme Q10 (CO Q 10 PO) Take 1 capsule by mouth daily.   . diazepam (VALIUM) 5 MG tablet 1 po qhs prn   . Ferrous Sulfate (IRON SUPPLEMENT PO) Take 1 tablet by mouth 2 (two) times daily. 65mg    . latanoprost (XALATAN) 0.005 % ophthalmic solution Place 1 drop into both eyes at bedtime.  10/24/2013: Received from: External Pharmacy  . lisinopril (ZESTRIL) 5 MG tablet Take 1 tablet  (5 mg total) by mouth daily.   . metFORMIN (GLUCOPHAGE-XR) 500 MG 24 hr tablet TAKE 4 TABLETS(2000 MG) BY MOUTH DAILY WITH BREAKFAST   . Naproxen Sodium (ALEVE PO) Take 2 tablets by mouth daily.   Marland Kitchen omeprazole (PRILOSEC) 40 MG capsule TAKE 1 CAPSULE BY MOUTH ONCE DAILY   . repaglinide (PRANDIN) 2 MG tablet TAKE 1 TABLET(2 MG) BY MOUTH THREE TIMES DAILY BEFORE MEALS   . rosuvastatin (CRESTOR) 40 MG tablet Take 1 tablet (40 mg total) by mouth at bedtime.   . triamcinolone cream (KENALOG) 0.1 % Apply 1 application topically 2 (two) times daily.    No facility-administered encounter medications on file as of 10/23/2019.   SDOH Screenings   Alcohol Screen:   . Last Alcohol Screening Score (AUDIT):   Depression (PHQ2-9): Low Risk   . PHQ-2 Score: 0  Financial Resource Strain: Low Risk   . Difficulty of Paying Living Expenses: Not hard at all  Food Insecurity:   . Worried About Charity fundraiser in the Last Year:   . [place] in the Last Year:   Housing:   . Last Housing Risk Score:   Physical Activity:   . Days of Exercise per Week:   . Minutes of Exercise per Session:   Social Connections:   . Frequency of Communication with Friends and Family:   . Frequency of  Social Gatherings with Friends and Family:   . Attends Religious Services:   . Active Member of Clubs or Organizations:   . Attends Archivist Meetings:   Marland Kitchen Marital Status:   Stress:   . Feeling of Stress :   Tobacco Use: Medium Risk  . Smoking Tobacco Use: Former Smoker  . Smokeless Tobacco Use: Never Used  Transportation Needs:   . Film/video editor (Medical):   Marland Kitchen Lack of Transportation (Non-Medical):     Current Diagnosis/Assessment:  Goals Addressed            This Visit's Progress   . Pharmacy Care Plan       CARE PLAN ENTRY  Current Barriers:  . Chronic Disease Management support, education, and care coordination needs related to Diabetes, Hypertension, Hyperlipidemia, Nerve Pain,  Anxiety, Glaucoma, GERD, Insomnia   Hypertension . Pharmacist Clinical Goal(s): o Over the next 90 days, patient will work with PharmD and providers to maintain BP goal <140/90 . Current regimen:  o Lisinopril 5mg  daily AM . Patient self care activities - Over the next 90 days, patient will: o Check BP 1-2 times per week, document, and provide at future appointments o Ensure daily salt intake < 2300 mg/Alyssa Ford  Hyperlipidemia . Pharmacist Clinical Goal(s): o Over the next 90 days, patient will work with PharmD and providers to maintain LDL goal <100 . Current regimen:  o Rosuvastatin 40mg  daily AM, Coenzyne Q10 200mg  daily . Interventions: o Consider taking rosuvastatin in the morning to help you remember to take it . Patient self care activities - Over the next 90 days, patient will: o Maintain cholesterol medication regimen.   Diabetes . Pharmacist Clinical Goal(s): o Over the next 90 days, patient will work with PharmD and providers to maintain A1c goal <7% . Current regimen:  o  Acarbose 25mg  TID, metformin XR 500 #4 daily AM, repaglinide 2mg  TID with meals, bromocriptine 2.5mg  daily  . Interventions: o Research cost of alternative medication options.  . Patient self care activities - Over the next 90 days, patient will: o Check blood sugar once daily, document, and provide at future appointments o Contact provider with any episodes of hypoglycemia  GERD . Pharmacist Clinical Goal(s) o Over the next 90 days, patient will work with PharmD and providers to reduce symptoms of GERD . Current regimen:  o Omeprazole 40mg  daily . Interventions: o Consider reducing omeprazole to 20mg  daily . Patient self care activities - Over the next 90 days, patient will: o Maintain GERD medication regimen  Medication management . Pharmacist Clinical Goal(s): o Over the next 90 days, patient will work with PharmD and providers to maintain optimal medication adherence . Current pharmacy:  [facility] . Interventions o Comprehensive medication review performed. o Continue current medication management strategy . Patient self care activities - Over the next 90 days, patient will: o Focus on medication adherence by filling medications appropriately  o Take medications as prescribed o Report any questions or concerns to PharmD and/or provider(s)  Initial goal documentation       Social Hx:  Originally from [place]. Married 49 years. 3 children. 2 grandchildren. 1 great grandchild 6 weeks.   Uses pill box   Diabetes   Recent Relevant Labs: Lab Results  Component Value Date/Time   HGBA1C 6.9 (H) 09/22/2019 10:40 AM   HGBA1C 6.4 (A) 09/09/2019 07:53 AM   HGBA1C 7.0 (A) 04/08/2019 07:37 AM   HGBA1C 7.4 (H) 11/23/2016 10:04 AM   MICROALBUR 1.1 04/17/2019  08:48 AM   MICROALBUR 1.1 04/08/2018 03:24 PM    A1c goal <7% FBG: 80-130 PPBG: <180  Checking BG: Daily (about 5 times per week)  Recent FBG Readings: Usually run in 120s per patient Highest: 210 an hour after eating Lowest: 90-100 No symptoms of hypoglycemia  Patient has failed these meds in past: glimepiride (efficacy?), januvia (cost) Patient is currently controlled on the following medications: acarbose 25mg  TID, metformin XR 500 #4 daily AM, repaglinide 2mg  TID with meals, bromocriptine 2.5mg  daily (started by Endo about 3 years ago)  Paying $150 for 3 month supply of bromocriptine  Last diabetic Eye exam:  Lab Results  Component Value Date/Time   HMDIABEYEEXA Retinopathy (A) 06/17/2018 12:00 AM  Last seen November 2020 per patient (Dr. Gala Romney)   Last diabetic Foot exam:  Lab Results  Component Value Date/Time   HMDIABFOOTEX normal 06/06/2010 12:00 AM   Dr. Loanne Ford performed during 09/09/19 visit per patient  Followed by Endo (Dr. Hale Bogus).   Used medicare.gov after visit to determine drug cost for first line DM therapies and it appears cost could be a barrier.  Patient would quickly approach  the coverage gap (~2 months) if she were to be prescribed Jardiance or Ozempic. This would present a barrier to adherence.   We discussed: possibility of other medications to consider for replacement of her current regimen (espcially noting once daily and once weekly options), but it appears cost may be a barrier.   Plan -Continue current medications   Future Plan -See if patient would be eligible for patient assistance that could allow her to utilize 1st line options for DM (SGLT2 Inhibitor or GLP1 Agonist)  Hypertension   CMP Latest Ref Rng & Units 09/22/2019 04/17/2019 01/27/2019  Glucose 70 - 99 mg/dL 211(H) 157(H) 175(H)  BUN 6 - 23 mg/dL 20 13 16   Creatinine 0.40 - 1.20 mg/dL 0.77 0.81 0.80  Sodium 135 - 145 mEq/L 135 136 137  Potassium 3.5 - 5.1 mEq/L 3.6 3.8 3.7  Chloride 96 - 112 mEq/L 98 92(L) 97  CO2 19 - 32 mEq/L 27 33(H) 31  Calcium 8.4 - 10.5 mg/dL 8.9 9.9 10.2  Total Protein 6.0 - 8.3 g/dL 6.5 7.1 7.0  Total Bilirubin 0.2 - 1.2 mg/dL 0.3 0.4 0.3  Alkaline Phos 39 - 117 U/L 80 93 84  AST 0 - 37 U/L 12 14 13   ALT 0 - 35 U/L 14 18 18   GFR      74.22   70.09  BP today is:  Unable to assess due to phone visit  Office blood pressures are  BP Readings from Last 3 Encounters:  09/22/19 110/68  09/09/19 102/70  04/17/19 126/70    BP goal <140/90 and >90/60  Patient has failed these meds in the past: hctz (BP running low) Patient is currently controlled on the following medications: lisinopril 5mg  daily AM  Patient checks BP at home 1-2x per week  Patient home BP readings are ranging: "They have been low" Top number around 120-130, bottom number sometimes less than 70  We discussed blood pressure goals  Plan -Continue current medications     Hyperlipidemia   Lipid Panel     Component Value Date/Time   CHOL 125 09/22/2019 1040   TRIG 123.0 09/22/2019 1040   TRIG 216 (HH) 04/24/2006 0741   HDL 41.70 09/22/2019 1040   CHOLHDL 3 09/22/2019 1040   VLDL 24.6  09/22/2019 1040   LDLCALC 59 09/22/2019 1040   LDLDIRECT  103.0 04/08/2018 1524     The ASCVD Risk score Mikey Bussing DC Jr., et al., 2013) failed to calculate for the following reasons:   The valid total cholesterol range is 130 to 320 mg/dL   Patient has failed these meds in past: atorvastatin (inefficacy) Patient is currently controlled on the following medications: rosuvastatin 40mg  daily AM, Coenzyne Q10 200mg  daily  Feels like rosuvastatin keeps her up at night if she takes it at night  We discussed:  That it is ok to take rosuvastatin in the morning since it is a longer acting statin  Plan -Continue current medications  Nerve Pain    Patient has failed these meds in past: None noted  Patient is currently controlled on the following medications: amitriptyline 25mg  #2 HS  Patient feels this regimen is working for her  Plan -Continue current medications    Anxiety    Patient has failed these meds in past: None noted  Patient is currently controlled on the following medications: diazepam 5mg  PRN, bupropion XL 150mg  daily  Last used diazepam about 3 weeks ago. Script usually lasts her 6 months  Plan -Continue current medications   Glaucoma     Patient has failed these meds in past: None noted  Patient is currently controlled on the following medications: latanoprost 0.005 1 drop each eye nightly  Last seen in November. Going back in May  Plan -Continue current medications   GERD    Patient has failed these meds in past: None noted  Patient is currently controlled on the following medications: omeprazole 40mg  daily  Breakthrough Sx: Only when she forgets to take her medicine Triggers: Sausage, bacon, greasy foods, spicy food  She is interested in seeing if she can get symptom control with 20mg  daily  We discussed:  Risk/benefit of long term PPI use  Plan -Consider reducing omeprazole to 20mg  daily   Insomnia    Patient has failed these meds in past:  None noted  Patient is currently controlled on the following medications: melatonin 10mg  1/4 tab HS.  Patients wonders if she can continue taking melatonin  We discussed:  We discussed the mechanism of action of melatonin and that it is ok to use long term especially noting that it works for the patient and sufficient sleep is important to health. We also discussed the "less is more" phenomenon with melatonin noting pt's low dose  Plan -Continue current medications   Miscellaneous Meds Biotin Ferrous Sulfate 65mg  BID Triamcinolone Prevagen

## 2019-11-03 DIAGNOSIS — H401131 Primary open-angle glaucoma, bilateral, mild stage: Secondary | ICD-10-CM | POA: Diagnosis not present

## 2019-11-03 DIAGNOSIS — H26493 Other secondary cataract, bilateral: Secondary | ICD-10-CM | POA: Diagnosis not present

## 2019-11-03 DIAGNOSIS — Z961 Presence of intraocular lens: Secondary | ICD-10-CM | POA: Diagnosis not present

## 2019-11-03 DIAGNOSIS — E119 Type 2 diabetes mellitus without complications: Secondary | ICD-10-CM | POA: Diagnosis not present

## 2019-11-08 NOTE — Patient Instructions (Signed)
Visit Information  Goals Addressed            This Visit's Progress   . Pharmacy Care Plan       CARE PLAN ENTRY  Current Barriers:  . Chronic Disease Management support, education, and care coordination needs related to Diabetes, Hypertension, Hyperlipidemia, Nerve Pain, Anxiety, Glaucoma, GERD, Insomnia   Hypertension . Pharmacist Clinical Goal(s): o Over the next 90 days, patient will work with PharmD and providers to maintain BP goal <140/90 . Current regimen:  o Lisinopril 5mg  daily AM . Patient self care activities - Over the next 90 days, patient will: o Check BP 1-2 times per week, document, and provide at future appointments o Ensure daily salt intake < 2300 mg/Mirca Yale  Hyperlipidemia . Pharmacist Clinical Goal(s): o Over the next 90 days, patient will work with PharmD and providers to maintain LDL goal <100 . Current regimen:  o Rosuvastatin 40mg  daily AM, Coenzyne Q10 200mg  daily . Interventions: o Consider taking rosuvastatin in the morning to help you remember to take it . Patient self care activities - Over the next 90 days, patient will: o Maintain cholesterol medication regimen.   Diabetes . Pharmacist Clinical Goal(s): o Over the next 90 days, patient will work with PharmD and providers to maintain A1c goal <7% . Current regimen:  o  Acarbose 25mg  TID, metformin XR 500 #4 daily AM, repaglinide 2mg  TID with meals, bromocriptine 2.5mg  daily  . Interventions: o Research cost of alternative medication options.  . Patient self care activities - Over the next 90 days, patient will: o Check blood sugar once daily, document, and provide at future appointments o Contact provider with any episodes of hypoglycemia  GERD . Pharmacist Clinical Goal(s) o Over the next 90 days, patient will work with PharmD and providers to reduce symptoms of GERD . Current regimen:  o Omeprazole 40mg  daily . Interventions: o Consider reducing omeprazole to 20mg  daily . Patient self  care activities - Over the next 90 days, patient will: o Maintain GERD medication regimen  Medication management . Pharmacist Clinical Goal(s): o Over the next 90 days, patient will work with PharmD and providers to maintain optimal medication adherence . Current pharmacy: [facility] . Interventions o Comprehensive medication review performed. o Continue current medication management strategy . Patient self care activities - Over the next 90 days, patient will: o Focus on medication adherence by filling medications appropriately  o Take medications as prescribed o Report any questions or concerns to PharmD and/or provider(s)  Initial goal documentation        Ms. Barley was given information about Chronic Care Management services today including:  1. CCM service includes personalized support from designated clinical staff supervised by her physician, including individualized plan of care and coordination with other care providers 2. 24/7 contact phone numbers for assistance for urgent and routine care needs. 3. Standard insurance, coinsurance, copays and deductibles apply for chronic care management only during months in which we provide at least 20 minutes of these services. Most insurances cover these services at 100%, however patients may be responsible for any copay, coinsurance and/or deductible if applicable. This service may help you avoid the need for more expensive face-to-face services. 4. Only one practitioner may furnish and bill the service in a calendar month. 5. The patient may stop CCM services at any time (effective at the end of the month) by phone call to the office staff.  Patient agreed to services and verbal consent obtained.  The patient verbalized understanding of instructions provided today and agreed to receive a mailed copy of patient instruction and/or educational materials. Telephone follow up appointment with pharmacy team member scheduled for:  12/02/2019  Melvenia Beam Shawan Tosh, PharmD Clinical Pharmacist [facility] at [facility] [phone]

## 2019-11-12 DIAGNOSIS — X32XXXA Exposure to sunlight, initial encounter: Secondary | ICD-10-CM | POA: Diagnosis not present

## 2019-11-12 DIAGNOSIS — Z1283 Encounter for screening for malignant neoplasm of skin: Secondary | ICD-10-CM | POA: Diagnosis not present

## 2019-11-12 DIAGNOSIS — L821 Other seborrheic keratosis: Secondary | ICD-10-CM | POA: Diagnosis not present

## 2019-11-12 DIAGNOSIS — L820 Inflamed seborrheic keratosis: Secondary | ICD-10-CM | POA: Diagnosis not present

## 2019-11-12 DIAGNOSIS — L669 Cicatricial alopecia, unspecified: Secondary | ICD-10-CM | POA: Diagnosis not present

## 2019-11-12 DIAGNOSIS — L570 Actinic keratosis: Secondary | ICD-10-CM | POA: Diagnosis not present

## 2019-11-21 ENCOUNTER — Other Ambulatory Visit: Payer: Self-pay

## 2019-11-21 DIAGNOSIS — K219 Gastro-esophageal reflux disease without esophagitis: Secondary | ICD-10-CM

## 2019-11-21 MED ORDER — OMEPRAZOLE 40 MG PO CPDR: 40.0000 mg | DELAYED_RELEASE_CAPSULE | Freq: Every day | ORAL | 3 refills | Status: DC

## 2019-12-01 ENCOUNTER — Other Ambulatory Visit: Payer: Self-pay

## 2019-12-01 DIAGNOSIS — F32A Depression, unspecified: Secondary | ICD-10-CM

## 2019-12-01 MED ORDER — BUPROPION HCL ER (XL) 150 MG PO TB24: ORAL_TABLET | ORAL | 1 refills | Status: DC

## 2019-12-02 ENCOUNTER — Telehealth: Payer: Medicare Other

## 2019-12-11 ENCOUNTER — Ambulatory Visit: Payer: Medicare Other | Admitting: Pharmacist

## 2019-12-11 ENCOUNTER — Other Ambulatory Visit: Payer: Self-pay

## 2019-12-11 DIAGNOSIS — IMO0002 Reserved for concepts with insufficient information to code with codable children: Secondary | ICD-10-CM

## 2019-12-11 DIAGNOSIS — E1151 Type 2 diabetes mellitus with diabetic peripheral angiopathy without gangrene: Secondary | ICD-10-CM

## 2019-12-11 DIAGNOSIS — K219 Gastro-esophageal reflux disease without esophagitis: Secondary | ICD-10-CM

## 2019-12-11 DIAGNOSIS — E1165 Type 2 diabetes mellitus with hyperglycemia: Secondary | ICD-10-CM

## 2019-12-11 NOTE — Chronic Care Management (AMB) (Signed)
Chronic Care Management Pharmacy  Name: Alyssa Ford  MRN: [id number] DOB: June 24, 1951  Chief Complaint/ HPI  Alyssa Ford,  69 y.o. , female presents for their Follow-Up CCM visit with the clinical pharmacist via telephone due to COVID-19 Pandemic.  PCP : Ann Held, DO  Their chronic conditions include: Diabetes, Hypertension, Hyperlipidemia, Nerve Pain, Anxiety, Glaucoma, GERD, Insomnia  Office Visits: None since last CCM visit on 10/23/19.  Consult Visit: None since last CCM visit on 10/23/19.  Medications: Outpatient Encounter Medications as of 12/11/2019  Medication Sig Note  . acarbose (PRECOSE) 25 MG tablet Take 1 tablet (25 mg total) by mouth 3 (three) times daily with meals.   Marland Kitchen amitriptyline (ELAVIL) 25 MG tablet Take 2 tablets (50 mg total) by mouth daily.   Marland Kitchen aspirin EC 81 MG tablet Take 81 mg by mouth daily.   Marland Kitchen BIOTIN PO Take 1 tablet by mouth daily.    . bromocriptine (PARLODEL) 2.5 MG tablet Take 1 tablet (2.5 mg total) by mouth daily.   Marland Kitchen buPROPion (WELLBUTRIN XL) 150 MG 24 hr tablet Take 2 tablets by mouth once every morning   . Coenzyme Q10 (CO Q 10 PO) Take 1 capsule by mouth daily.   . diazepam (VALIUM) 5 MG tablet 1 po qhs prn   . Ferrous Sulfate (IRON SUPPLEMENT PO) Take 1 tablet by mouth 2 (two) times daily. 65mg    . latanoprost (XALATAN) 0.005 % ophthalmic solution Place 1 drop into both eyes at bedtime.  10/24/2013: Received from: External Pharmacy  . lisinopril (ZESTRIL) 5 MG tablet Take 1 tablet (5 mg total) by mouth daily.   . metFORMIN (GLUCOPHAGE-XR) 500 MG 24 hr tablet TAKE 4 TABLETS(2000 MG) BY MOUTH DAILY WITH BREAKFAST   . Naproxen Sodium (ALEVE PO) Take 2 tablets by mouth daily.   Marland Kitchen omeprazole (PRILOSEC) 40 MG capsule Take 1 capsule (40 mg total) by mouth daily.   . repaglinide (PRANDIN) 2 MG tablet TAKE 1 TABLET(2 MG) BY MOUTH THREE TIMES DAILY BEFORE MEALS   . rosuvastatin (CRESTOR) 40 MG tablet Take 1 tablet (40 mg total) by  mouth at bedtime.   . triamcinolone cream (KENALOG) 0.1 % Apply 1 application topically 2 (two) times daily.    No facility-administered encounter medications on file as of 12/11/2019.   SDOH Screenings   Alcohol Screen:   . Last Alcohol Screening Score (AUDIT):   Depression (PHQ2-9): Low Risk   . PHQ-2 Score: 0  Financial Resource Strain: Low Risk   . Difficulty of Paying Living Expenses: Not hard at all  Food Insecurity:   . Worried About Charity fundraiser in the Last Year:   . [place] in the Last Year:   Housing:   . Last Housing Risk Score:   Physical Activity:   . Days of Exercise per Week:   . Minutes of Exercise per Session:   Social Connections:   . Frequency of Communication with Friends and Family:   . Frequency of Social Gatherings with Friends and Family:   . Attends Religious Services:   . Active Member of Clubs or Organizations:   . Attends Archivist Meetings:   Marland Kitchen Marital Status:   Stress:   . Feeling of Stress :   Tobacco Use: Medium Risk  . Smoking Tobacco Use: Former Smoker  . Smokeless Tobacco Use: Never Used  Transportation Needs:   . Film/video editor (Medical):   Marland Kitchen Lack of Transportation (Non-Medical):  Current Diagnosis/Assessment:  Goals Addressed   None    Social Hx:  Originally from [place]. Married 49 years. 3 children. 2 grandchildren. 1 great grandchild 6 weeks.   Uses pill box  Diabetes   Recent Relevant Labs: Lab Results  Component Value Date/Time   HGBA1C 6.9 (H) 09/22/2019 10:40 AM   HGBA1C 6.4 (A) 09/09/2019 07:53 AM   HGBA1C 7.0 (A) 04/08/2019 07:37 AM   HGBA1C 7.4 (H) 11/23/2016 10:04 AM   MICROALBUR 1.1 04/17/2019 08:48 AM   MICROALBUR 1.1 04/08/2018 03:24 PM    A1c goal <7% FBG: 80-130 PPBG: <180  Checking BG: Daily (about 5 times per week)  Recent FBG Readings: Usually run in 120s per patient Highest: 210 an hour after eating Lowest: 90-100 No symptoms of hypoglycemia  Patient has  failed these meds in past: glimepiride (efficacy?), januvia (cost) Patient is currently controlled on the following medications: acarbose 25mg  TID, metformin XR 500 #4 daily AM, repaglinide 2mg  TID with meals, bromocriptine 2.5mg  daily (started by Endo about 3 years ago)  Paying $150 for 3 month supply of bromocriptine  Last diabetic Eye exam:  Lab Results  Component Value Date/Time   HMDIABEYEEXA Retinopathy (A) 06/17/2018 12:00 AM  Last seen November 2020 per patient (Dr. Gala Romney)   Last diabetic Foot exam:  Lab Results  Component Value Date/Time   HMDIABFOOTEX normal 06/06/2010 12:00 AM   Dr. Loanne Drilling performed during 09/09/19 visit per patient  Followed by Endo (Dr. Hale Bogus).   Used medicare.gov after visit to determine drug cost for first line DM therapies and it appears cost could be a barrier.  Patient would quickly approach the coverage gap (~2 months) if she were to be prescribed Jardiance or Ozempic. This would present a barrier to adherence.   We discussed: possibility of other medications to consider for replacement of her current regimen (espcially noting once daily and once weekly options), but it appears cost may be a barrier.    Update 12/11/19 Is walking 1.5 miles most days.  Denies any s/sx of hypoglycemia. Can feel hypoglycemia less than 90.   Plan -Continue current medications   Future Plan -See if patient would be eligible for patient assistance that could allow her to utilize 1st line options for DM (SGLT2 Inhibitor or GLP1 Agonist)    GERD    Patient has failed these meds in past: None noted  Patient is currently controlled on the following medications: omeprazole 40mg  daily  Breakthrough Sx: Only when she forgets to take her medicine Triggers: Sausage, bacon, greasy foods, spicy food  She is interested in seeing if she can get symptom control with 20mg  daily  Update Got a 90 supply of 40mg  so has not worked on tapering plan. Will wait until  patient runs out of current supply before starting tapering plan  We discussed:  Risk/benefit of long term PPI use  Plan -Consider reducing omeprazole to 20mg  daily      Miscellaneous Meds Biotin Ferrous Sulfate 65mg  BID Triamcinolone Prevagen

## 2019-12-24 ENCOUNTER — Other Ambulatory Visit: Payer: Self-pay | Admitting: Endocrinology

## 2019-12-24 DIAGNOSIS — E1165 Type 2 diabetes mellitus with hyperglycemia: Secondary | ICD-10-CM

## 2019-12-24 DIAGNOSIS — IMO0002 Reserved for concepts with insufficient information to code with codable children: Secondary | ICD-10-CM

## 2019-12-24 DIAGNOSIS — E1151 Type 2 diabetes mellitus with diabetic peripheral angiopathy without gangrene: Secondary | ICD-10-CM

## 2019-12-24 NOTE — Patient Instructions (Signed)
Visit Information  Goals Addressed            This Visit's Progress   . Pharmacy Care Plan       CARE PLAN ENTRY  Current Barriers:  . Chronic Disease Management support, education, and care coordination needs related to Diabetes, Hypertension, Hyperlipidemia, Nerve Pain, Anxiety, Glaucoma, GERD, Insomnia   Hypertension . Pharmacist Clinical Goal(s): o Over the next 90 days, patient will work with PharmD and providers to maintain BP goal <140/90 . Current regimen:  o Lisinopril 5mg  daily AM . Patient self care activities - Over the next 90 days, patient will: o Check BP 1-2 times per week, document, and provide at future appointments o Ensure daily salt intake < 2300 mg/Alyssa Ford  Hyperlipidemia . Pharmacist Clinical Goal(s): o Over the next 90 days, patient will work with PharmD and providers to maintain LDL goal <100 . Current regimen:  o Rosuvastatin 40mg  daily AM, Coenzyne Q10 200mg  daily . Interventions: o Consider taking rosuvastatin in the morning to help you remember to take it . Patient self care activities - Over the next 90 days, patient will: o Maintain cholesterol medication regimen.   Diabetes . Pharmacist Clinical Goal(s): o Over the next 90 days, patient will work with PharmD and providers to maintain A1c goal <7% . Current regimen:  o  Acarbose 25mg  TID, metformin XR 500 #4 daily AM, repaglinide 2mg  TID with meals, bromocriptine 2.5mg  daily  . Interventions: o Research cost of alternative medication options.  . Patient self care activities - Over the next 90 days, patient will: o Check blood sugar once daily, document, and provide at future appointments o Contact provider with any episodes of hypoglycemia  GERD . Pharmacist Clinical Goal(s) o Over the next 90 days, patient will work with PharmD and providers to reduce symptoms of GERD . Current regimen:  o Omeprazole 40mg  daily . Interventions: o Consider reducing omeprazole to 20mg  daily . Patient self  care activities - Over the next 90 days, patient will: o Maintain GERD medication regimen  Medication management . Pharmacist Clinical Goal(s): o Over the next 90 days, patient will work with PharmD and providers to maintain optimal medication adherence . Current pharmacy: [facility] . Interventions o Comprehensive medication review performed. o Continue current medication management strategy . Patient self care activities - Over the next 90 days, patient will: o Focus on medication adherence by filling medications appropriately  o Take medications as prescribed o Report any questions or concerns to PharmD and/or provider(s)  Please see past updates related to this goal by clicking on the "Past Updates" button in the selected goal         The patient verbalized understanding of instructions provided today and agreed to receive a mailed copy of patient instruction and/or educational materials.  Telephone follow up appointment with pharmacy team member scheduled for: 02/10/2020  Alyssa Ford, PharmD Clinical Pharmacist [facility] [phone]

## 2019-12-31 ENCOUNTER — Encounter (HOSPITAL_COMMUNITY): Payer: Self-pay

## 2019-12-31 ENCOUNTER — Other Ambulatory Visit: Payer: Self-pay

## 2019-12-31 ENCOUNTER — Ambulatory Visit (HOSPITAL_COMMUNITY)
Admission: EM | Admit: 2019-12-31 | Discharge: 2019-12-31 | Disposition: A | Discharge status: Home / Self Care | Payer: Medicare Other | Attending: Family Medicine | Admitting: Family Medicine

## 2019-12-31 DIAGNOSIS — N390 Urinary tract infection, site not specified: Secondary | ICD-10-CM | POA: Insufficient documentation

## 2019-12-31 DIAGNOSIS — R358 Other polyuria: Secondary | ICD-10-CM

## 2019-12-31 LAB — POCT URINALYSIS DIP (DEVICE)
Bilirubin Urine: NEGATIVE
Glucose, UA: NEGATIVE mg/dL
Hgb urine dipstick: NEGATIVE
Ketones, ur: NEGATIVE mg/dL
Nitrite: NEGATIVE
Protein, ur: NEGATIVE mg/dL
Specific Gravity, Urine: 1.01 (ref 1.005–1.030)
Urobilinogen, UA: 0.2 mg/dL (ref 0.0–1.0)
pH: 5.5 (ref 5.0–8.0)

## 2019-12-31 MED ORDER — NITROFURANTOIN MONOHYD MACRO 100 MG PO CAPS
100.0000 mg | ORAL_CAPSULE | Freq: Two times a day (BID) | ORAL | 0 refills | Status: DC
Start: 2019-12-31 — End: 2020-04-08

## 2019-12-31 NOTE — ED Triage Notes (Signed)
Patient presents today with complaints of polyuria, odor that began last week. Patient does have Hx UTIs.  Patient denies: fever, flank pain, dysuria

## 2019-12-31 NOTE — ED Provider Notes (Signed)
[place]    CSN: [id number] Arrival date & time: 12/31/19  1628      History   Chief Complaint Chief Complaint  Patient presents with  . Polyuria    HPI Alyssa Ford is a 69 y.o. female.   HPI  Patient states she has urinary frequency, polyuria, urinary odor that started a couple days ago.  She has a history of urinary tract infections but has not had one for over 5 years.  She thinks this is a bladder infection.  She has some suprapubic pressure.  No flank pain.  No nausea or vomiting.  No fever or chills.  No history of kidney stones or kidney infections  Past Medical History:  Diagnosis Date  . Anemia   . Anxiety   . Arthritis   . Cataract   . Chronic headaches   . Depression   . Diabetes mellitus   . Diverticulosis of colon (without mention of hemorrhage) 2003, 2008   Colonoscopy  . GERD (gastroesophageal reflux disease)   . Glaucoma   . Hiatal hernia 2003   EGD   . Hyperlipidemia   . Hypertension   . Thyroid nodule     Patient Active Problem List   Diagnosis Date Noted  . Uncontrolled type 2 diabetes mellitus with hyperglycemia ([place]) 09/22/2019  . De Quervain's disease (tenosynovitis) 09/22/2019  . Neck mass 07/11/2018  . Abnormal WBC count 05/29/2017  . Skin fissure 09/24/2015  . Allergic rhinitis 03/26/2014  . Wheezing 03/26/2014  . DM (diabetes mellitus) type II uncontrolled, periph vascular disorder ([place]) 08/25/2011  . Headache(784.0) 08/21/2011  . Acute sinusitis 04/26/2010  . URI 04/26/2010  . UNSPECIFIED CATARACT 02/23/2010  . POSTMENOPAUSAL STATUS 12/12/2007  . FATIGUE 09/11/2007  . THYROID NODULE 11/22/2006  . Diabetes ([place]) 11/22/2006  . Hyperlipidemia LDL goal <70 11/22/2006  . GLUCOMA 11/22/2006  . Essential hypertension 11/22/2006  . GERD 11/22/2006    Past Surgical History:  Procedure Laterality Date  . CARPAL TUNNEL RELEASE  2006  . CARPAL TUNNEL RELEASE  12/24/2007  . CATARACT EXTRACTION, BILATERAL     [place]---   sept and oct 2018  . CERVICAL FUSION  3/08  . CHOLECYSTECTOMY    . FASCIECTOMY Left 08/01/2018   Procedure: FASCIECTOMY LEFT RING;  Surgeon: Daryll Brod, MD;  Location: [place];  Service: Orthopedics;  Laterality: Left;  UPPER ARM  . TRIGGER FINGER RELEASE Left 08/01/2018   Procedure: RELEASE TRIGGER FINGER/A-1 PULLEY LEFTRING;  Surgeon: Daryll Brod, MD;  Location: [place];  Service: Orthopedics;  Laterality: Left;    OB History   No obstetric history on file.      Home Medications    Prior to Admission medications   Medication Sig Start Date End Date Taking? Authorizing Provider  acarbose (PRECOSE) 25 MG tablet Take 1 tablet (25 mg total) by mouth 3 (three) times daily with meals. 04/08/19   Renato Shin, MD  amitriptyline (ELAVIL) 25 MG tablet Take 2 tablets (50 mg total) by mouth daily. 08/07/19   Ann Held, DO  aspirin EC 81 MG tablet Take 81 mg by mouth daily.    Carollee Herter, Vadhir Mcnay R, DO  BIOTIN PO Take 1 tablet by mouth daily.     [provider]  bromocriptine (PARLODEL) 2.5 MG tablet TAKE 1 TABLET(2.5 MG) BY MOUTH DAILY 12/24/19   Renato Shin, MD  buPROPion (WELLBUTRIN XL) 150 MG 24 hr tablet Take 2 tablets by mouth once every  morning 12/01/19   Ann Held, DO  Coenzyme Q10 (CO Q 10 PO) Take 1 capsule by mouth daily.    [provider]  diazepam (VALIUM) 5 MG tablet 1 po qhs prn 08/21/19   Carollee Herter, Alferd Apa, DO  Ferrous Sulfate (IRON SUPPLEMENT PO) Take 1 tablet by mouth 2 (two) times daily. 65mg     [provider]  latanoprost (XALATAN) 0.005 % ophthalmic solution Place 1 drop into both eyes at bedtime.  10/17/13   [provider]  lisinopril (ZESTRIL) 5 MG tablet Take 1 tablet (5 mg total) by mouth daily. 04/17/19   Roma Schanz R, DO  metFORMIN (GLUCOPHAGE-XR) 500 MG 24 hr tablet TAKE 4 TABLETS(2000 MG) BY MOUTH DAILY WITH BREAKFAST 10/17/19   Renato Shin, MD  Naproxen  Sodium (ALEVE PO) Take 2 tablets by mouth daily.    [provider]  nitrofurantoin, macrocrystal-monohydrate, (MACROBID) 100 MG capsule Take 1 capsule (100 mg total) by mouth 2 (two) times daily. 12/31/19   Raylene Everts, MD  omeprazole (PRILOSEC) 40 MG capsule Take 1 capsule (40 mg total) by mouth daily. 11/21/19   Roma Schanz R, DO  repaglinide (PRANDIN) 2 MG tablet TAKE 1 TABLET(2 MG) BY MOUTH THREE TIMES DAILY BEFORE MEALS 12/24/19   Renato Shin, MD  rosuvastatin (CRESTOR) 40 MG tablet Take 1 tablet (40 mg total) by mouth at bedtime. 04/17/19   Roma Schanz R, DO  triamcinolone cream (KENALOG) 0.1 % Apply 1 application topically 2 (two) times daily. 03/05/19   Shelda Pal, DO    Family History Family History  Problem Relation Age of Onset  . Dementia Mother   . Breast cancer Mother 13  . Kidney failure Mother 56       died from kidney failure from abx given to her for pneumonia  . Hyperlipidemia Mother   . Hypertension Mother   . Diabetes Mother   . Cancer Mother 70       breast  . Parkinsonism Father   . Diabetes Father 11  . Hypertension Sister   . Diabetes Brother   . Hypertension Brother   . Heart attack Brother   . Heart disease Cousin 31       mi  . Colon cancer Neg Hx     Social History Social History   Tobacco Use  . Smoking status: Former Smoker    Packs/day: 1.40    Years: 49.00    Pack years: 68.60    Types: Cigarettes    Quit date: 07/16/2015    Years since quitting: 4.4  . Smokeless tobacco: Never Used  . Tobacco comment: e cig  Vaping Use  . Vaping Use: Every day  Substance Use Topics  . Alcohol use: Yes    Alcohol/week: 0.0 standard drinks    Comment: once weekly; wine and beer  . Drug use: No     Allergies   Codeine   Review of Systems Review of Systems See HPI  Physical Exam Triage Vital Signs ED Triage Vitals  Enc Vitals Group     BP 12/31/19 1802 (!) 172/79     Pulse Rate 12/31/19 1802 81      Resp 12/31/19 1802 18     Temp 12/31/19 1802 98.1 F (36.7 C)     Temp Source 12/31/19 1802 Oral     SpO2 12/31/19 1802 98 %     Weight --      Height --  Head Circumference --      Peak Flow --      Pain Score 12/31/19 1804 3     Pain Loc --      Pain Edu? --      Excl. in [place]? --    No data found.  Updated Vital Signs BP (!) 172/79 (BP Location: Left Arm)   Pulse 81   Temp 98.1 F (36.7 C) (Oral)   Resp 18   SpO2 98%      Physical Exam Constitutional:      General: She is not in acute distress.    Appearance: She is well-developed.  HENT:     Head: Normocephalic and atraumatic.     Mouth/Throat:     Comments: Mask is in place Eyes:     Conjunctiva/sclera: Conjunctivae normal.     Pupils: Pupils are equal, round, and reactive to light.  Cardiovascular:     Rate and Rhythm: Normal rate.  Pulmonary:     Effort: Pulmonary effort is normal. No respiratory distress.  Abdominal:     General: There is no distension.     Palpations: Abdomen is soft.     Tenderness: There is no abdominal tenderness. There is no right CVA tenderness or left CVA tenderness.  Musculoskeletal:        General: Normal range of motion.     Cervical back: Normal range of motion.  Skin:    General: Skin is warm and dry.  Neurological:     Mental Status: She is alert.  Psychiatric:        Mood and Affect: Mood normal.        Behavior: Behavior normal.      UC Treatments / Results  Labs (all labs ordered are listed, but only abnormal results are displayed) Labs Reviewed  POCT URINALYSIS DIP (DEVICE) - Abnormal; Notable for the following components:      Result Value   Leukocytes,Ua TRACE (*)    All other components within normal limits  URINE CULTURE    EKG   Radiology No results found.  Procedures Procedures (including critical care time)  Medications Ordered in UC Medications - No data to display  Initial Impression / Assessment and Plan / UC Course  I have  reviewed the triage vital signs and the nursing notes.  Pertinent labs & imaging results that were available during my care of the patient were reviewed by me and considered in my medical decision making (see chart for details).     Reviewed that she does not have a lot of findings on urinalysis but her symptoms are suspicious.  We will do a culture, place her on antibiotics, and follow Final Clinical Impressions(s) / UC Diagnoses   Final diagnoses:  Lower urinary tract infection     Discharge Instructions     Take the antibiotic 2 times a day for 5 days Drink plenty of water You should see improvement over the next 24 to 48 hours Is your urine has been sent for culture.  You will be called if any change in antibiotics is indicated   ED Prescriptions    Medication Sig Dispense Auth. Provider   nitrofurantoin, macrocrystal-monohydrate, (MACROBID) 100 MG capsule Take 1 capsule (100 mg total) by mouth 2 (two) times daily. 10 capsule Raylene Everts, MD     PDMP not reviewed this encounter.   Raylene Everts, MD 12/31/19 [phone]

## 2019-12-31 NOTE — Discharge Instructions (Addendum)
Take the antibiotic 2 times a day for 5 days Drink plenty of water You should see improvement over the next 24 to 48 hours Is your urine has been sent for culture.  You will be called if any change in antibiotics is indicated

## 2020-01-06 ENCOUNTER — Ambulatory Visit (INDEPENDENT_AMBULATORY_CARE_PROVIDER_SITE_OTHER): Payer: Medicare Other | Admitting: *Deleted

## 2020-01-06 ENCOUNTER — Ambulatory Visit (INDEPENDENT_AMBULATORY_CARE_PROVIDER_SITE_OTHER): Payer: Medicare Other | Admitting: Family Medicine

## 2020-01-06 ENCOUNTER — Other Ambulatory Visit (HOSPITAL_COMMUNITY)
Admission: RE | Admit: 2020-01-06 | Discharge: 2020-01-06 | Disposition: A | Discharge status: Home / Self Care | Payer: Medicare Other | Source: Ambulatory Visit | Attending: Family Medicine | Admitting: Family Medicine

## 2020-01-06 ENCOUNTER — Other Ambulatory Visit: Payer: Self-pay

## 2020-01-06 ENCOUNTER — Encounter: Payer: Self-pay | Admitting: *Deleted

## 2020-01-06 VITALS — BP 135/74 | HR 77 | Temp 97.1°F | Ht 66.0 in | Wt 164.2 lb

## 2020-01-06 VITALS — BP 135/74 | HR 77 | Temp 97.1°F | Ht 66.0 in | Wt 164.3 lb

## 2020-01-06 DIAGNOSIS — N898 Other specified noninflammatory disorders of vagina: Secondary | ICD-10-CM | POA: Diagnosis not present

## 2020-01-06 DIAGNOSIS — R17 Unspecified jaundice: Secondary | ICD-10-CM

## 2020-01-06 DIAGNOSIS — Z0000 Encounter for general adult medical examination without abnormal findings: Secondary | ICD-10-CM

## 2020-01-06 LAB — POC URINALSYSI DIPSTICK (AUTOMATED)
Bilirubin, UA: NEGATIVE
Blood, UA: NEGATIVE
Glucose, UA: NEGATIVE
Ketones, UA: NEGATIVE
Leukocytes, UA: NEGATIVE
Nitrite, UA: NEGATIVE
Protein, UA: NEGATIVE
Spec Grav, UA: 1.015 (ref 1.010–1.025)
Urobilinogen, UA: 0.2 E.U./dL
pH, UA: 5 (ref 5.0–8.0)

## 2020-01-06 NOTE — Progress Notes (Signed)
Subjective:   Alyssa Ford is a 69 y.o. female who presents for Medicare Annual (Subsequent) preventive examination.  Review of Systems     Cardiac Risk Factors include: advanced age (>26men, >41 women);dyslipidemia;hypertension;diabetes mellitus     Objective:    Today's Vitals   01/06/20 1451  BP: 135/74  Pulse: 77  Temp: (!) 97.1 F (36.2 C)  TempSrc: Temporal  SpO2: 99%  Weight: 164 lb 3.2 oz (74.5 kg)  Height: 5\' 6"  (1.676 m)   Body mass index is 26.5 kg/m.  Advanced Directives 01/06/2020 02/21/2019 08/01/2018 11/21/2017 06/20/2017 12/14/2016 08/22/2016  Does Patient Have a Medical Advance Directive? No No No No No No No  Would patient like information on creating a medical advance directive? Yes (MAU/Ambulatory/Procedural Areas - Information given) - Yes (MAU/Ambulatory/Procedural Areas - Information given) - - Yes (MAU/Ambulatory/Procedural Areas - Information given) -    Current Medications (verified) Outpatient Encounter Medications as of 01/06/2020  Medication Sig  . acarbose (PRECOSE) 25 MG tablet Take 1 tablet (25 mg total) by mouth 3 (three) times daily with meals.  Marland Kitchen amitriptyline (ELAVIL) 25 MG tablet Take 2 tablets (50 mg total) by mouth daily.  Marland Kitchen aspirin EC 81 MG tablet Take 81 mg by mouth daily.  Marland Kitchen BIOTIN PO Take 1 tablet by mouth daily.   . bromocriptine (PARLODEL) 2.5 MG tablet TAKE 1 TABLET(2.5 MG) BY MOUTH DAILY  . Coenzyme Q10 (CO Q 10 PO) Take 1 capsule by mouth daily.  . diazepam (VALIUM) 5 MG tablet 1 po qhs prn  . Ferrous Sulfate (IRON SUPPLEMENT PO) Take 1 tablet by mouth 2 (two) times daily. 65mg   . latanoprost (XALATAN) 0.005 % ophthalmic solution Place 1 drop into both eyes at bedtime.   Marland Kitchen lisinopril (ZESTRIL) 5 MG tablet Take 1 tablet (5 mg total) by mouth daily.  . metFORMIN (GLUCOPHAGE-XR) 500 MG 24 hr tablet TAKE 4 TABLETS(2000 MG) BY MOUTH DAILY WITH BREAKFAST  . nitrofurantoin, macrocrystal-monohydrate, (MACROBID) 100 MG capsule Take 1  capsule (100 mg total) by mouth 2 (two) times daily.  Marland Kitchen omeprazole (PRILOSEC) 40 MG capsule Take 1 capsule (40 mg total) by mouth daily.  . repaglinide (PRANDIN) 2 MG tablet TAKE 1 TABLET(2 MG) BY MOUTH THREE TIMES DAILY BEFORE MEALS  . rosuvastatin (CRESTOR) 40 MG tablet Take 1 tablet (40 mg total) by mouth at bedtime.  . triamcinolone cream (KENALOG) 0.1 % Apply 1 application topically 2 (two) times daily.  Marland Kitchen buPROPion (WELLBUTRIN XL) 150 MG 24 hr tablet Take 2 tablets by mouth once every morning (Patient not taking: Reported on 01/06/2020)  . Naproxen Sodium (ALEVE PO) Take 2 tablets by mouth daily. (Patient not taking: Reported on 01/06/2020)   No facility-administered encounter medications on file as of 01/06/2020.    Allergies (verified) Codeine   History: Past Medical History:  Diagnosis Date  . Anemia   . Anxiety   . Arthritis   . Cataract   . Chronic headaches   . Depression   . Diabetes mellitus   . Diverticulosis of colon (without mention of hemorrhage) 2003, 2008   Colonoscopy  . GERD (gastroesophageal reflux disease)   . Glaucoma   . Hiatal hernia 2003   EGD   . Hyperlipidemia   . Hypertension   . Thyroid nodule    Past Surgical History:  Procedure Laterality Date  . CARPAL TUNNEL RELEASE  2006  . CARPAL TUNNEL RELEASE  12/24/2007  . CATARACT EXTRACTION, BILATERAL     [facility]---  sept and oct 2018  . CERVICAL FUSION  3/08  . CHOLECYSTECTOMY    . FASCIECTOMY Left 08/01/2018   Procedure: FASCIECTOMY LEFT RING;  Surgeon: Daryll Brod, MD;  Location: [place];  Service: Orthopedics;  Laterality: Left;  UPPER ARM  . TRIGGER FINGER RELEASE Left 08/01/2018   Procedure: RELEASE TRIGGER FINGER/A-1 PULLEY LEFTRING;  Surgeon: Daryll Brod, MD;  Location: [place];  Service: Orthopedics;  Laterality: Left;   Family History  Problem Relation Age of Onset  . Dementia Mother   . Breast cancer Mother 48  . Kidney failure Mother 79       died from  kidney failure from abx given to her for pneumonia  . Hyperlipidemia Mother   . Hypertension Mother   . Diabetes Mother   . Cancer Mother 21       breast  . Parkinsonism Father   . Diabetes Father 32  . Hypertension Sister   . Diabetes Brother   . Hypertension Brother   . Heart attack Brother   . Heart disease Cousin 46       mi  . Colon cancer Neg Hx    Social History   Socioeconomic History  . Marital status: Married    Spouse name: Not on file  . Number of children: Not on file  . Years of education: Not on file  . Highest education level: Not on file  Occupational History  . Occupation: retired  Tobacco Use  . Smoking status: Former Smoker    Packs/day: 1.40    Years: 49.00    Pack years: 68.60    Types: Cigarettes    Quit date: 07/16/2015    Years since quitting: 4.4  . Smokeless tobacco: Never Used  . Tobacco comment: e cig  Vaping Use  . Vaping Use: Every day  Substance and Sexual Activity  . Alcohol use: Yes    Alcohol/week: 0.0 standard drinks    Comment: once weekly; wine and beer  . Drug use: No  . Sexual activity: Yes    Partners: Male  Other Topics Concern  . Not on file  Social History Narrative   Exercise--  walk   Social Determinants of Health   Financial Resource Strain: Low Risk   . Difficulty of Paying Living Expenses: Not hard at all  Food Insecurity: No Food Insecurity  . Worried About Charity fundraiser in the Last Year: Never true  . Ran Out of Food in the Last Year: Never true  Transportation Needs: No Transportation Needs  . Lack of Transportation (Medical): No  . Lack of Transportation (Non-Medical): No  Physical Activity:   . Days of Exercise per Week:   . Minutes of Exercise per Session:   Stress:   . Feeling of Stress :   Social Connections:   . Frequency of Communication with Friends and Family:   . Frequency of Social Gatherings with Friends and Family:   . Attends Religious Services:   . Active Member of Clubs or  Organizations:   . Attends Archivist Meetings:   Marland Kitchen Marital Status:     Tobacco Counseling Counseling given: Not Answered Comment: e cig   Clinical Intake:   Pain : No/denies pain    Activities of Daily Living In your present state of health, do you have any difficulty performing the following activities: 01/06/2020 03/05/2019  Hearing? N N  Vision? N N  Difficulty concentrating or making decisions? N N  Walking or climbing stairs? N N  Dressing or bathing? N N  Doing errands, shopping? N N  Preparing Food and eating ? N -  Using the Toilet? N -  In the past six months, have you accidently leaked urine? N -  Do you have problems with loss of bowel control? N -  Managing your Medications? N -  Managing your Finances? N -  Housekeeping or managing your Housekeeping? N -  Some recent data might be hidden    Patient Care Team: Carollee Herter, Alferd Apa, DO as PCP - General Calvert Cantor, MD as Consulting Physician (Ophthalmology) Levy Sjogren, MD as Referring Physician (Dermatology) Ilda Foil Sol Blazing, MD as Referring Physician (Pediatrics) Renato Shin, MD as Consulting Physician (Endocrinology) Day, Melvenia Beam, [facility] as Pharmacist (Pharmacist)  Indicate any recent Medical Services you may have received from other than [facility] providers in the past year (date may be approximate).     Assessment:   This is a routine wellness examination for Alyssa Ford.  Dietary issues and exercise activities discussed: Current Exercise Habits: Home exercise routine, Type of exercise: walking, Time (Minutes): 15, Frequency (Times/Week): 7, Weekly Exercise (Minutes/Week): 105, Intensity: Mild, Exercise limited by: None identified   Diet (meal preparation, eat out, water intake, caffeinated beverages, dairy products, fruits and vegetables): well balanced    Goals    . Begin walking at least 3x/week    . Pharmacy Care Plan     CARE PLAN ENTRY  Current Barriers:   . Chronic Disease Management support, education, and care coordination needs related to Diabetes, Hypertension, Hyperlipidemia, Nerve Pain, Anxiety, Glaucoma, GERD, Insomnia   Hypertension . Pharmacist Clinical Goal(s): o Over the next 90 days, patient will work with PharmD and providers to maintain BP goal <140/90 . Current regimen:  o Lisinopril 5mg  daily AM . Patient self care activities - Over the next 90 days, patient will: o Check BP 1-2 times per week, document, and provide at future appointments o Ensure daily salt intake < 2300 mg/day  Hyperlipidemia . Pharmacist Clinical Goal(s): o Over the next 90 days, patient will work with PharmD and providers to maintain LDL goal <100 . Current regimen:  o Rosuvastatin 40mg  daily AM, Coenzyne Q10 200mg  daily . Interventions: o Consider taking rosuvastatin in the morning to help you remember to take it . Patient self care activities - Over the next 90 days, patient will: o Maintain cholesterol medication regimen.   Diabetes . Pharmacist Clinical Goal(s): o Over the next 90 days, patient will work with PharmD and providers to maintain A1c goal <7% . Current regimen:  o  Acarbose 25mg  TID, metformin XR 500 #4 daily AM, repaglinide 2mg  TID with meals, bromocriptine 2.5mg  daily  . Interventions: o Research cost of alternative medication options.  . Patient self care activities - Over the next 90 days, patient will: o Check blood sugar once daily, document, and provide at future appointments o Contact provider with any episodes of hypoglycemia  GERD . Pharmacist Clinical Goal(s) o Over the next 90 days, patient will work with PharmD and providers to reduce symptoms of GERD . Current regimen:  o Omeprazole 40mg  daily . Interventions: o Consider reducing omeprazole to 20mg  daily . Patient self care activities - Over the next 90 days, patient will: o Maintain GERD medication regimen  Medication management . Pharmacist Clinical  Goal(s): o Over the next 90 days, patient will work with PharmD and providers to maintain optimal medication adherence . Current pharmacy: [facility] .  Interventions o Comprehensive medication review performed. o Continue current medication management strategy . Patient self care activities - Over the next 90 days, patient will: o Focus on medication adherence by filling medications appropriately  o Take medications as prescribed o Report any questions or concerns to PharmD and/or provider(s)  Please see past updates related to this goal by clicking on the "Past Updates" button in the selected goal        Depression Screen PHQ 2/9 Scores 01/06/2020 03/05/2019 05/29/2017 12/14/2016 10/27/2016 04/28/2016 09/13/2015  PHQ - 2 Score 0 0 0 0 0 0 0  PHQ- 9 Score - 0 0 - - - -    Fall Risk Fall Risk  01/06/2020 03/05/2019 12/14/2016 10/27/2016 09/13/2015  Falls in the past year? 1 1 Yes No No  Number falls in past yr: 0 0 1 - -  Injury with Fall? 1 1 No - -  Follow up Education provided;Falls prevention discussed - Education provided;Falls prevention discussed - -   Lives w/ husband in 1 story home.  Any stairs in or around the home? Yes  If so, are there any without handrails? No  Home free of loose throw rugs in walkways, pet beds, electrical cords, etc? Yes  Adequate lighting in your home to reduce risk of falls? Yes   ASSISTIVE DEVICES UTILIZED TO PREVENT FALLS:  Life alert? No  Use of a cane, walker or w/c? No  Grab bars in the bathroom? No  Shower chair or bench in shower? No  Elevated toilet seat or a handicapped toilet? Yes   Gait steady and fast without use of assistive device  Cognitive Function:  MMSE - Mini Mental State Exam 12/14/2016  Orientation to time 5  Orientation to Place 5  Registration 3  Attention/ Calculation 5  Recall 3  Language- name 2 objects 2  Language- repeat 1  Language- follow 3 step command 3  Language- read & follow direction 1  Write a sentence 1   Copy design 1  Total score 30     6CIT Screen 01/06/2020  What Year? 0 points  What month? 0 points  What time? 0 points  Count back from 20 0 points  Months in reverse 0 points    Immunizations Immunization History  Administered Date(s) Administered  . Fluad Quad(high Dose 65+) 04/08/2019  . Influenza Split 04/13/2011  . Influenza Whole 04/17/2006  . Influenza, High Dose Seasonal PF 04/12/2016, 05/07/2017, 04/08/2018  . Influenza, Seasonal, Injecte, Preservative Fre 05/22/2012  . Influenza,inj,Quad PF,6+ Mos 03/03/2014, 03/18/2015  . Influenza-Unspecified 05/07/2017  . PFIZER SARS-COV-2 Vaccination 07/21/2019, 08/11/2019  . Pneumococcal Conjugate-13 09/13/2015, 05/07/2017, 04/15/2019  . Pneumococcal Polysaccharide-23 03/31/2003, 11/03/2008, 05/07/2017  . Td 10/24/1999, 02/23/2010  . Tdap 02/21/2019  . Zoster 12/25/2011  . Zoster Recombinat (Shingrix) 03/09/2017, 06/23/2017    TDAP status: Up to date Flu Vaccine status: Up to date Pneumococcal vaccine status: Up to date Covid-19 vaccine status: Completed vaccines  Qualifies for Shingles Vaccine? Yes   Zostavax completed Yes   Shingrix Completed?: Yes  Screening Tests Health Maintenance  Topic Date Due  . OPHTHALMOLOGY EXAM  06/18/2019  . FOOT EXAM  07/09/2019  . INFLUENZA VACCINE  01/25/2020  . HEMOGLOBIN A1C  03/24/2020  . PNA vac Low Risk Adult (2 of 2 - PPSV23) 04/14/2020  . MAMMOGRAM  08/04/2020  . COLONOSCOPY  11/21/2020  . DEXA SCAN  08/04/2021  . TETANUS/TDAP  02/20/2029  . COVID-19 Vaccine  Completed  . Hepatitis C Screening  Completed    Health Maintenance  Health Maintenance Due  Topic Date Due  . OPHTHALMOLOGY EXAM  06/18/2019  . FOOT EXAM  07/09/2019    Colorectal cancer screening: Completed 11/21/17. Repeat every 3 years Mammogram status: Completed 08/07/19. Repeat every year Bone Density status: Completed 08/05/19. Results reflect: Bone density results: NORMAL. Repeat every 2 years.  Lung  Cancer Screening: (Low Dose CT Chest recommended if Age 24-80 years, 30 pack-year currently smoking OR have quit w/in 15years.) does qualify.   Lung Cancer Screening Referral: Completed screening 06/03/19  Additional Screening:  Hepatitis C Screening: does qualify; Completed 04/27/15   Vision Screening: Recommended annual ophthalmology exams for early detection of glaucoma and other disorders of the eye. Is the patient up to date with their annual eye exam?  Yes  Who is the provider or what is the name of the office in which the patient attends annual eye exams? Dr.Glenn w/ [place]   Dental Screening: Recommended annual dental exams for proper oral hygiene  Community Resource Referral / Chronic Care Management: CRR required this visit?  No   CCM required this visit?  No      Plan:    Please schedule your next medicare wellness visit with me in 1 yr.  Continue to eat heart healthy diet (full of fruits, vegetables, whole grains, lean protein, water--limit salt, fat, and sugar intake) and increase physical activity as tolerated.  Continue doing brain stimulating activities (puzzles, reading, adult coloring books, staying active) to keep memory sharp.   Bring a copy of your living will and/or healthcare power of attorney to your next office visit.    I have personally reviewed and noted the following in the patient's chart:   . Medical and social history . Use of alcohol, tobacco or illicit drugs  . Current medications and supplements . Functional ability and status . Nutritional status . Physical activity . Advanced directives . List of other physicians . Hospitalizations, surgeries, and ER visits in previous 12 months . Vitals . Screenings to include cognitive, depression, and falls . Referrals and appointments  In addition, I have reviewed and discussed with patient certain preventive protocols, quality metrics, and best practice recommendations. A written  personalized care plan for preventive services as well as general preventive health recommendations were provided to patient.     Naaman Plummer [place], [place]   01/06/2020

## 2020-01-06 NOTE — Patient Instructions (Signed)
Urinary Tract Infection, Adult A urinary tract infection (UTI) is an infection of any part of the urinary tract. The urinary tract includes:  The kidneys.  The ureters.  The bladder.  The urethra. These organs make, store, and get rid of pee (urine) in the body. What are the causes? This is caused by germs (bacteria) in your genital area. These germs grow and cause swelling (inflammation) of your urinary tract. What increases the risk? You are more likely to develop this condition if:  You have a small, thin tube (catheter) to drain pee.  You cannot control when you pee or poop (incontinence).  You are female, and: ? You use these methods to prevent pregnancy:  A medicine that kills sperm (spermicide).  A device that blocks sperm (diaphragm). ? You have low levels of a female hormone (estrogen). ? You are pregnant.  You have genes that add to your risk.  You are sexually active.  You take antibiotic medicines.  You have trouble peeing because of: ? A prostate that is bigger than normal, if you are female. ? A blockage in the part of your body that drains pee from the bladder (urethra). ? A kidney stone. ? A nerve condition that affects your bladder (neurogenic bladder). ? Not getting enough to drink. ? Not peeing often enough.  You have other conditions, such as: ? Diabetes. ? A weak disease-fighting system (immune system). ? Sickle cell disease. ? Gout. ? Injury of the spine. What are the signs or symptoms? Symptoms of this condition include:  Needing to pee right away (urgently).  Peeing often.  Peeing small amounts often.  Pain or burning when peeing.  Blood in the pee.  Pee that smells bad or not like normal.  Trouble peeing.  Pee that is cloudy.  Fluid coming from the vagina, if you are female.  Pain in the belly or lower back. Other symptoms include:  Throwing up (vomiting).  No urge to eat.  Feeling mixed up (confused).  Being tired  and grouchy (irritable).  A fever.  Watery poop (diarrhea). How is this treated? This condition may be treated with:  Antibiotic medicine.  Other medicines.  Drinking enough water. Follow these instructions at home:  Medicines  Take over-the-counter and prescription medicines only as told by your doctor.  If you were prescribed an antibiotic medicine, take it as told by your doctor. Do not stop taking it even if you start to feel better. General instructions  Make sure you: ? Pee until your bladder is empty. ? Do not hold pee for a long time. ? Empty your bladder after sex. ? Wipe from front to back after pooping if you are a female. Use each tissue one time when you wipe.  Drink enough fluid to keep your pee pale yellow.  Keep all follow-up visits as told by your doctor. This is important. Contact a doctor if:  You do not get better after 1-2 days.  Your symptoms go away and then come back. Get help right away if:  You have very bad back pain.  You have very bad pain in your lower belly.  You have a fever.  You are sick to your stomach (nauseous).  You are throwing up. Summary  A urinary tract infection (UTI) is an infection of any part of the urinary tract.  This condition is caused by germs in your genital area.  There are many risk factors for a UTI. These include having a small, thin   tube to drain pee and not being able to control when you pee or poop.  Treatment includes antibiotic medicines for germs.  Drink enough fluid to keep your pee pale yellow. This information is not intended to replace advice given to you by your health care provider. Make sure you discuss any questions you have with your health care provider. Document Revised: 05/30/2018 Document Reviewed: 12/20/2017 Elsevier Patient Education  2020 [employer]  

## 2020-01-06 NOTE — Patient Instructions (Signed)
Please schedule your next medicare wellness visit with me in 1 yr.  Continue to eat heart healthy diet (full of fruits, vegetables, whole grains, lean protein, water--limit salt, fat, and sugar intake) and increase physical activity as tolerated.  Continue doing brain stimulating activities (puzzles, reading, adult coloring books, staying active) to keep memory sharp.   Bring a copy of your living will and/or healthcare power of attorney to your next office visit.   Alyssa Ford , Thank you for taking time to come for your Medicare Wellness Visit. I appreciate your ongoing commitment to your health goals. Please review the following plan we discussed and let me know if I can assist you in the future.   These are the goals we discussed: Goals    . Begin walking at least 3x/week    . Pharmacy Care Plan     CARE PLAN ENTRY  Current Barriers:  . Chronic Disease Management support, education, and care coordination needs related to Diabetes, Hypertension, Hyperlipidemia, Nerve Pain, Anxiety, Glaucoma, GERD, Insomnia   Hypertension . Pharmacist Clinical Goal(s): o Over the next 90 days, patient will work with PharmD and providers to maintain BP goal <140/90 . Current regimen:  o Lisinopril '5mg'$  daily AM . Patient self care activities - Over the next 90 days, patient will: o Check BP 1-2 times per week, document, and provide at future appointments o Ensure daily salt intake < 2300 mg/day  Hyperlipidemia . Pharmacist Clinical Goal(s): o Over the next 90 days, patient will work with PharmD and providers to maintain LDL goal <100 . Current regimen:  o Rosuvastatin '40mg'$  daily AM, Coenzyne Q10 '200mg'$  daily . Interventions: o Consider taking rosuvastatin in the morning to help you remember to take it . Patient self care activities - Over the next 90 days, patient will: o Maintain cholesterol medication regimen.   Diabetes . Pharmacist Clinical Goal(s): o Over the next 90 days, patient will  work with PharmD and providers to maintain A1c goal <7% . Current regimen:  o  Acarbose '25mg'$  TID, metformin XR 500 #4 daily AM, repaglinide '2mg'$  TID with meals, bromocriptine 2.'5mg'$  daily  . Interventions: o Research cost of alternative medication options.  . Patient self care activities - Over the next 90 days, patient will: o Check blood sugar once daily, document, and provide at future appointments o Contact provider with any episodes of hypoglycemia  GERD . Pharmacist Clinical Goal(s) o Over the next 90 days, patient will work with PharmD and providers to reduce symptoms of GERD . Current regimen:  o Omeprazole '40mg'$  daily . Interventions: o Consider reducing omeprazole to '20mg'$  daily . Patient self care activities - Over the next 90 days, patient will: o Maintain GERD medication regimen  Medication management . Pharmacist Clinical Goal(s): o Over the next 90 days, patient will work with PharmD and providers to maintain optimal medication adherence . Current pharmacy: [facility] . Interventions o Comprehensive medication review performed. o Continue current medication management strategy . Patient self care activities - Over the next 90 days, patient will: o Focus on medication adherence by filling medications appropriately  o Take medications as prescribed o Report any questions or concerns to PharmD and/or provider(s)  Please see past updates related to this goal by clicking on the "Past Updates" button in the selected goal         This is a list of the screening recommended for you and due dates:  Health Maintenance  Topic Date Due  . Eye exam  for diabetics  06/18/2019  . Complete foot exam   07/09/2019  . Flu Shot  01/25/2020  . Hemoglobin A1C  03/24/2020  . Pneumonia vaccines (2 of 2 - PPSV23) 04/14/2020  . Mammogram  08/04/2020  . Colon Cancer Screening  11/21/2020  . DEXA scan (bone density measurement)  08/04/2021  . Tetanus Vaccine  02/20/2029  . COVID-19  Vaccine  Completed  .  Hepatitis C: One time screening is recommended by Center for Disease Control  (CDC) for  adults born from [birth year] through 1966.   Completed    Preventive Care 65 Years and Older, Female Preventive care refers to lifestyle choices and visits with your health care provider that can promote health and wellness. This includes:  A yearly physical exam. This is also called an annual well check.  Regular dental and eye exams.  Immunizations.  Screening for certain conditions.  Healthy lifestyle choices, such as diet and exercise. What can I expect for my preventive care visit? Physical exam Your health care provider will check:  Height and weight. These may be used to calculate body mass index (BMI), which is a measurement that tells if you are at a healthy weight.  Heart rate and blood pressure.  Your skin for abnormal spots. Counseling Your health care provider may ask you questions about:  Alcohol, tobacco, and drug use.  Emotional well-being.  Home and relationship well-being.  Sexual activity.  Eating habits.  History of falls.  Memory and ability to understand (cognition).  Work and work Statistician.  Pregnancy and menstrual history. What immunizations do I need?  Influenza (flu) vaccine  This is recommended every year. Tetanus, diphtheria, and pertussis (Tdap) vaccine  You may need a Td booster every 10 years. Varicella (chickenpox) vaccine  You may need this vaccine if you have not already been vaccinated. Zoster (shingles) vaccine  You may need this after age 25. Pneumococcal conjugate (PCV13) vaccine  One dose is recommended after age 9. Pneumococcal polysaccharide (PPSV23) vaccine  One dose is recommended after age 35. Measles, mumps, and rubella (MMR) vaccine  You may need at least one dose of MMR if you were born in 1957 or later. You may also need a second dose. Meningococcal conjugate (MenACWY) vaccine  You may need  this if you have certain conditions. Hepatitis A vaccine  You may need this if you have certain conditions or if you travel or work in places where you may be exposed to hepatitis A. Hepatitis B vaccine  You may need this if you have certain conditions or if you travel or work in places where you may be exposed to hepatitis B. Haemophilus influenzae type b (Hib) vaccine  You may need this if you have certain conditions. You may receive vaccines as individual doses or as more than one vaccine together in one shot (combination vaccines). Talk with your health care provider about the risks and benefits of combination vaccines. What tests do I need? Blood tests  Lipid and cholesterol levels. These may be checked every 5 years, or more frequently depending on your overall health.  Hepatitis C test.  Hepatitis B test. Screening  Lung cancer screening. You may have this screening every year starting at age 29 if you have a 30-pack-year history of smoking and currently smoke or have quit within the past 15 years.  Colorectal cancer screening. All adults should have this screening starting at age 50 and continuing until age 36. Your health care provider may recommend screening  at age 101 if you are at increased risk. You will have tests every 1-10 years, depending on your results and the type of screening test.  Diabetes screening. This is done by checking your blood sugar (glucose) after you have not eaten for a while (fasting). You may have this done every 1-3 years.  Mammogram. This may be done every 1-2 years. Talk with your health care provider about how often you should have regular mammograms.  BRCA-related cancer screening. This may be done if you have a family history of breast, ovarian, tubal, or peritoneal cancers. Other tests  Sexually transmitted disease (STD) testing.  Bone density scan. This is done to screen for osteoporosis. You may have this done starting at age 53. Follow  these instructions at home: Eating and drinking  Eat a diet that includes fresh fruits and vegetables, whole grains, lean protein, and low-fat dairy products. Limit your intake of foods with high amounts of sugar, saturated fats, and salt.  Take vitamin and mineral supplements as recommended by your health care provider.  Do not drink alcohol if your health care provider tells you not to drink.  If you drink alcohol: ? Limit how much you have to 0-1 drink a day. ? Be aware of how much alcohol is in your drink. In the [place] one drink equals one 12 oz bottle of beer (355 mL), one 5 oz glass of wine (148 mL), or one 1 oz glass of hard liquor (44 mL). Lifestyle  Take daily care of your teeth and gums.  Stay active. Exercise for at least 30 minutes on 5 or more days each week.  Do not use any products that contain nicotine or tobacco, such as cigarettes, e-cigarettes, and chewing tobacco. If you need help quitting, ask your health care provider.  If you are sexually active, practice safe sex. Use a condom or other form of protection in order to prevent STIs (sexually transmitted infections).  Talk with your health care provider about taking a low-dose aspirin or statin. What's next?  Go to your health care provider once a year for a well check visit.  Ask your health care provider how often you should have your eyes and teeth checked.  Stay up to date on all vaccines. This information is not intended to replace advice given to you by your health care provider. Make sure you discuss any questions you have with your health care provider. Document Revised: 06/06/2018 Document Reviewed: 06/06/2018 Elsevier Patient Education  2020 [employer].

## 2020-01-07 LAB — CBC WITH DIFFERENTIAL/PLATELET
Basophils Absolute: 0.1 10*3/uL (ref 0.0–0.1)
Basophils Relative: 1.1 % (ref 0.0–3.0)
Eosinophils Absolute: 0.2 10*3/uL (ref 0.0–0.7)
Eosinophils Relative: 2.2 % (ref 0.0–5.0)
HCT: 38.8 % (ref 36.0–46.0)
Hemoglobin: 12.9 g/dL (ref 12.0–15.0)
Lymphocytes Relative: 32.2 % (ref 12.0–46.0)
Lymphs Abs: 2.9 10*3/uL (ref 0.7–4.0)
MCHC: 33.2 g/dL (ref 30.0–36.0)
MCV: 89.6 fl (ref 78.0–100.0)
Monocytes Absolute: 0.7 10*3/uL (ref 0.1–1.0)
Monocytes Relative: 8 % (ref 3.0–12.0)
Neutro Abs: 5.1 10*3/uL (ref 1.4–7.7)
Neutrophils Relative %: 56.5 % (ref 43.0–77.0)
Platelets: 260 10*3/uL (ref 150.0–400.0)
RBC: 4.34 Mil/uL (ref 3.87–5.11)
RDW: 14.5 % (ref 11.5–15.5)
WBC: 9 10*3/uL (ref 4.0–10.5)

## 2020-01-07 LAB — COMPREHENSIVE METABOLIC PANEL
ALT: 18 U/L (ref 0–35)
AST: 17 U/L (ref 0–37)
Albumin: 4.7 g/dL (ref 3.5–5.2)
Alkaline Phosphatase: 86 U/L (ref 39–117)
BUN: 17 mg/dL (ref 6–23)
CO2: 31 mEq/L (ref 19–32)
Calcium: 10.3 mg/dL (ref 8.4–10.5)
Chloride: 99 mEq/L (ref 96–112)
Creatinine, Ser: 0.8 mg/dL (ref 0.40–1.20)
GFR: 70.95 mL/min (ref >60.00)
Glucose, Bld: 90 mg/dL (ref 70–99)
Potassium: 4.1 mEq/L (ref 3.5–5.1)
Sodium: 139 mEq/L (ref 135–145)
Total Bilirubin: 0.2 mg/dL (ref 0.2–1.2)
Total Protein: 7.3 g/dL (ref 6.0–8.3)

## 2020-01-07 LAB — URINE CULTURE

## 2020-01-07 LAB — LIPASE: Lipase: 24 U/L (ref 11.0–59.0)

## 2020-01-07 LAB — AMYLASE: Amylase: 21 U/L — ABNORMAL LOW (ref 27–131)

## 2020-01-09 LAB — URINE CYTOLOGY ANCILLARY ONLY
Bacterial Vaginitis-Urine: NEGATIVE
Candida Urine: NEGATIVE
Chlamydia: NEGATIVE
Comment: NEGATIVE
Comment: NEGATIVE
Comment: NORMAL
Neisseria Gonorrhea: NEGATIVE
Trichomonas: NEGATIVE

## 2020-01-11 ENCOUNTER — Encounter: Payer: Self-pay | Admitting: Family Medicine

## 2020-01-11 DIAGNOSIS — R17 Unspecified jaundice: Secondary | ICD-10-CM | POA: Insufficient documentation

## 2020-01-11 DIAGNOSIS — N898 Other specified noninflammatory disorders of vagina: Secondary | ICD-10-CM | POA: Insufficient documentation

## 2020-01-11 NOTE — Assessment & Plan Note (Signed)
Check urine and ancillary

## 2020-01-11 NOTE — Assessment & Plan Note (Signed)
Eyes were not yellow on exam  Will check labs

## 2020-01-11 NOTE — Progress Notes (Signed)
Patient ID: Alyssa Ford, female    DOB: [birth year]  Age: 69 y.o. MRN: [id number]    Subjective:  Subjective  HPI Alyssa Ford presents for c/o yellow eyes.   She also c/o odor in urine.  No dysuria / frequency   Review of Systems  Constitutional: Negative for appetite change, diaphoresis, fatigue and unexpected weight change.  Eyes: Negative for pain, redness and visual disturbance.  Respiratory: Negative for cough, chest tightness, shortness of breath and wheezing.   Cardiovascular: Negative for chest pain, palpitations and leg swelling.  Endocrine: Negative for cold intolerance, heat intolerance, polydipsia, polyphagia and polyuria.  Genitourinary: Negative for decreased urine volume, difficulty urinating, dysuria, frequency, urgency, vaginal bleeding, vaginal discharge and vaginal pain.  Neurological: Negative for dizziness, light-headedness, numbness and headaches.    History Past Medical History:  Diagnosis Date  . Anemia   . Anxiety   . Arthritis   . Cataract   . Chronic headaches   . Depression   . Diabetes mellitus   . Diverticulosis of colon (without mention of hemorrhage) 2003, 2008   Colonoscopy  . GERD (gastroesophageal reflux disease)   . Glaucoma   . Hiatal hernia 2003   EGD   . Hyperlipidemia   . Hypertension   . Thyroid nodule     She has a past surgical history that includes Cholecystectomy; Carpal tunnel release (2006); Carpal tunnel release (12/24/2007); Cervical fusion (3/08); Cataract extraction, bilateral; Fasciectomy (Left, 08/01/2018); and Trigger finger release (Left, 08/01/2018).   Her family history includes Breast cancer (age of onset: 61) in her mother; Cancer (age of onset: 22) in her mother; Dementia in her mother; Diabetes in her brother and mother; Diabetes (age of onset: 42) in her father; Heart attack in her brother; Heart disease (age of onset: 45) in her cousin; Hyperlipidemia in her mother; Hypertension in her brother, mother, and  sister; Kidney failure (age of onset: 65) in her mother; Parkinsonism in her father.She reports that she quit smoking about 4 years ago. Her smoking use included cigarettes. She has a 68.60 pack-year smoking history. She has never used smokeless tobacco. She reports current alcohol use. She reports that she does not use drugs.  Current Outpatient Medications on File Prior to Visit  Medication Sig Dispense Refill  . acarbose (PRECOSE) 25 MG tablet Take 1 tablet (25 mg total) by mouth 3 (three) times daily with meals. 90 tablet 11  . amitriptyline (ELAVIL) 25 MG tablet Take 2 tablets (50 mg total) by mouth daily. 180 tablet 1  . aspirin EC 81 MG tablet Take 81 mg by mouth daily.    Marland Kitchen BIOTIN PO Take 1 tablet by mouth daily.     . bromocriptine (PARLODEL) 2.5 MG tablet TAKE 1 TABLET(2.5 MG) BY MOUTH DAILY 90 tablet 3  . buPROPion (WELLBUTRIN XL) 150 MG 24 hr tablet Take 2 tablets by mouth once every morning (Patient not taking: Reported on 01/06/2020) 180 tablet 1  . Coenzyme Q10 (CO Q 10 PO) Take 1 capsule by mouth daily.    . diazepam (VALIUM) 5 MG tablet 1 po qhs prn 30 tablet 0  . Ferrous Sulfate (IRON SUPPLEMENT PO) Take 1 tablet by mouth 2 (two) times daily. 65mg     . latanoprost (XALATAN) 0.005 % ophthalmic solution Place 1 drop into both eyes at bedtime.     Marland Kitchen lisinopril (ZESTRIL) 5 MG tablet Take 1 tablet (5 mg total) by mouth daily. 90 tablet 3  . metFORMIN (GLUCOPHAGE-XR) 500 MG 24  hr tablet TAKE 4 TABLETS(2000 MG) BY MOUTH DAILY WITH BREAKFAST 360 tablet 0  . Naproxen Sodium (ALEVE PO) Take 2 tablets by mouth daily. (Patient not taking: Reported on 01/06/2020)    . nitrofurantoin, macrocrystal-monohydrate, (MACROBID) 100 MG capsule Take 1 capsule (100 mg total) by mouth 2 (two) times daily. 10 capsule 0  . omeprazole (PRILOSEC) 40 MG capsule Take 1 capsule (40 mg total) by mouth daily. 90 capsule 3  . repaglinide (PRANDIN) 2 MG tablet TAKE 1 TABLET(2 MG) BY MOUTH THREE TIMES DAILY BEFORE  MEALS 270 tablet 0  . rosuvastatin (CRESTOR) 40 MG tablet Take 1 tablet (40 mg total) by mouth at bedtime. 90 tablet 1  . triamcinolone cream (KENALOG) 0.1 % Apply 1 application topically 2 (two) times daily. 30 g 0   No current facility-administered medications on file prior to visit.     Objective:  Objective  Physical Exam Vitals and nursing note reviewed.  Constitutional:      Appearance: She is well-developed.  HENT:     Head: Normocephalic and atraumatic.  Eyes:     General: No allergic shiner, visual field deficit or scleral icterus.       Right eye: No foreign body, discharge or hordeolum.        Left eye: No foreign body, discharge or hordeolum.     Conjunctiva/sclera: Conjunctivae normal.  Neck:     Thyroid: No thyromegaly.     Vascular: No carotid bruit or JVD.  Cardiovascular:     Rate and Rhythm: Normal rate and regular rhythm.     Heart sounds: Normal heart sounds. No murmur heard.   Pulmonary:     Effort: Pulmonary effort is normal. No respiratory distress.     Breath sounds: Normal breath sounds. No wheezing or rales.  Chest:     Chest wall: No tenderness.  Musculoskeletal:     Cervical back: Normal range of motion and neck supple.  Neurological:     Mental Status: She is alert and oriented to person, place, and time.    BP 135/74   Pulse 77   Temp (!) 97.1 F (36.2 C)   Ht 5\' 6"  (1.676 m)   Wt 164 lb 5.1 oz (74.5 kg)   SpO2 99%   BMI 26.52 kg/m  Wt Readings from Last 3 Encounters:  01/06/20 164 lb 5.1 oz (74.5 kg)  01/06/20 164 lb 3.2 oz (74.5 kg)  09/22/19 159 lb 12.8 oz (72.5 kg)     Lab Results  Component Value Date   WBC 9.0 01/06/2020   HGB 12.9 01/06/2020   HCT 38.8 01/06/2020   PLT 260.0 01/06/2020   GLUCOSE 90 01/06/2020   CHOL 125 09/22/2019   TRIG 123.0 09/22/2019   HDL 41.70 09/22/2019   LDLDIRECT 103.0 04/08/2018   LDLCALC 59 09/22/2019   ALT 18 01/06/2020   AST 17 01/06/2020   NA 139 01/06/2020   K 4.1 01/06/2020   CL  99 01/06/2020   CREATININE 0.80 01/06/2020   BUN 17 01/06/2020   CO2 31 01/06/2020   TSH 4.23 07/08/2018   HGBA1C 6.9 (H) 09/22/2019   MICROALBUR 1.1 04/17/2019    No results found.   Assessment & Plan:  Plan  I am having Alyssa Ford maintain her BIOTIN PO, Naproxen Sodium (ALEVE PO), latanoprost, Ferrous Sulfate (IRON SUPPLEMENT PO), aspirin EC, Coenzyme Q10 (CO Q 10 PO), triamcinolone cream, acarbose, lisinopril, rosuvastatin, amitriptyline, diazepam, metFORMIN, omeprazole, buPROPion, repaglinide, bromocriptine, and nitrofurantoin (macrocrystal-monohydrate).  No  orders of the defined types were placed in this encounter.   Problem List Items Addressed This Visit      Unprioritized   Icterus    Eyes were not yellow on exam  Will check labs       Relevant Orders   CBC with Differential/Platelet (Completed)   Comprehensive metabolic panel (Completed)   Amylase (Completed)   Lipase (Completed)   Vaginal odor - Primary    Check urine and ancillary       Relevant Orders   POCT Urinalysis Dipstick (Automated) (Completed)   Urine cytology ancillary only(Talent) (Completed)   CBC with Differential/Platelet (Completed)   Comprehensive metabolic panel (Completed)   Amylase (Completed)   Lipase (Completed)      Follow-up: Return if symptoms worsen or fail to improve.  Ann Held, DO

## 2020-01-13 ENCOUNTER — Other Ambulatory Visit: Payer: Self-pay | Admitting: Endocrinology

## 2020-01-13 DIAGNOSIS — E1165 Type 2 diabetes mellitus with hyperglycemia: Secondary | ICD-10-CM

## 2020-01-13 DIAGNOSIS — E1151 Type 2 diabetes mellitus with diabetic peripheral angiopathy without gangrene: Secondary | ICD-10-CM

## 2020-01-13 DIAGNOSIS — IMO0002 Reserved for concepts with insufficient information to code with codable children: Secondary | ICD-10-CM

## 2020-01-21 ENCOUNTER — Encounter: Payer: Self-pay | Admitting: Endocrinology

## 2020-01-21 ENCOUNTER — Other Ambulatory Visit: Payer: Self-pay

## 2020-01-21 ENCOUNTER — Ambulatory Visit: Payer: Medicare Other | Admitting: Endocrinology

## 2020-01-21 VITALS — BP 148/78 | HR 72 | Ht 66.0 in | Wt 164.0 lb

## 2020-01-21 DIAGNOSIS — E1165 Type 2 diabetes mellitus with hyperglycemia: Secondary | ICD-10-CM | POA: Diagnosis not present

## 2020-01-21 DIAGNOSIS — E1151 Type 2 diabetes mellitus with diabetic peripheral angiopathy without gangrene: Secondary | ICD-10-CM | POA: Diagnosis not present

## 2020-01-21 DIAGNOSIS — IMO0002 Reserved for concepts with insufficient information to code with codable children: Secondary | ICD-10-CM

## 2020-01-21 LAB — POCT GLYCOSYLATED HEMOGLOBIN (HGB A1C): Hemoglobin A1C: 7.2 % — AB (ref 4.0–5.6)

## 2020-01-21 MED ORDER — ACARBOSE 50 MG PO TABS: 50.0000 mg | ORAL_TABLET | Freq: Three times a day (TID) | ORAL | 3 refills | Status: DC

## 2020-01-21 NOTE — Progress Notes (Signed)
Subjective:    Patient ID: Alyssa Ford, female    DOB: 11/28/1950, 69 y.o.   MRN: [id number]  HPI Pt returns for f/u of diabetes mellitus: DM type: 2 Dx'ed: 1062 Complications: PN and CAD (seen on CT).   Therapy: 4 oral meds GDM: never DKA: never Severe hypoglycemia: never Pancreatitis: never Pancreatic imaging: never Other: she has never been on insulin; SDOH: she wants generic meds, if possible.   Interval history: Pt states cbg's vary from 110-160.  She takes meds as rx'ed.  pt states she feels well in general.    Pt also has MNG ([place] in 2020 showed Left lower pole nodule 1 has slightly enlarged compared with greater than 2010; CT 9/20 was little changed; TSH is normal off rx).   Past Medical History:  Diagnosis Date  . Anemia   . Anxiety   . Arthritis   . Cataract   . Chronic headaches   . Depression   . Diabetes mellitus   . Diverticulosis of colon (without mention of hemorrhage) 2003, 2008   Colonoscopy  . GERD (gastroesophageal reflux disease)   . Glaucoma   . Hiatal hernia 2003   EGD   . Hyperlipidemia   . Hypertension   . Thyroid nodule     Past Surgical History:  Procedure Laterality Date  . CARPAL TUNNEL RELEASE  2006  . CARPAL TUNNEL RELEASE  12/24/2007  . CATARACT EXTRACTION, BILATERAL     [facility]---  sept and oct 2018  . CERVICAL FUSION  3/08  . CHOLECYSTECTOMY    . FASCIECTOMY Left 08/01/2018   Procedure: FASCIECTOMY LEFT RING;  Surgeon: Daryll Brod, MD;  Location: [facility];  Service: Orthopedics;  Laterality: Left;  UPPER ARM  . TRIGGER FINGER RELEASE Left 08/01/2018   Procedure: RELEASE TRIGGER FINGER/A-1 PULLEY LEFTRING;  Surgeon: Daryll Brod, MD;  Location: [facility];  Service: Orthopedics;  Laterality: Left;    Social History   Socioeconomic History  . Marital status: Married    Spouse name: Not on file  . Number of children: Not on file  . Years of education: Not on file  . Highest education level: Not on  file  Occupational History  . Occupation: retired  Tobacco Use  . Smoking status: Former Smoker    Packs/day: 1.40    Years: 49.00    Pack years: 68.60    Types: Cigarettes    Quit date: 07/16/2015    Years since quitting: 4.5  . Smokeless tobacco: Never Used  . Tobacco comment: e cig  Vaping Use  . Vaping Use: Every day  Substance and Sexual Activity  . Alcohol use: Yes    Alcohol/week: 0.0 standard drinks    Comment: once weekly; wine and beer  . Drug use: No  . Sexual activity: Yes    Partners: Male  Other Topics Concern  . Not on file  Social History Narrative   Exercise--  walk   Social Determinants of Health   Financial Resource Strain: Low Risk   . Difficulty of Paying Living Expenses: Not hard at all  Food Insecurity: No Food Insecurity  . Worried About Charity fundraiser in the Last Year: Never true  . Ran Out of Food in the Last Year: Never true  Transportation Needs: No Transportation Needs  . Lack of Transportation (Medical): No  . Lack of Transportation (Non-Medical): No  Physical Activity:   . Days of Exercise per Week:   . Minutes  of Exercise per Session:   Stress:   . Feeling of Stress :   Social Connections:   . Frequency of Communication with Friends and Family:   . Frequency of Social Gatherings with Friends and Family:   . Attends Religious Services:   . Active Member of Clubs or Organizations:   . Attends Archivist Meetings:   Marland Kitchen Marital Status:   Intimate Partner Violence:   . Fear of Current or Ex-Partner:   . Emotionally Abused:   Marland Kitchen Physically Abused:   . Sexually Abused:     Current Outpatient Medications on File Prior to Visit  Medication Sig Dispense Refill  . amitriptyline (ELAVIL) 25 MG tablet Take 2 tablets (50 mg total) by mouth daily. 180 tablet 1  . aspirin EC 81 MG tablet Take 81 mg by mouth daily.    Marland Kitchen BIOTIN PO Take 1 tablet by mouth daily.     . bromocriptine (PARLODEL) 2.5 MG tablet TAKE 1 TABLET(2.5 MG) BY  MOUTH DAILY 90 tablet 3  . buPROPion (WELLBUTRIN XL) 150 MG 24 hr tablet Take 2 tablets by mouth once every morning 180 tablet 1  . Coenzyme Q10 (CO Q 10 PO) Take 1 capsule by mouth daily.    . diazepam (VALIUM) 5 MG tablet 1 po qhs prn 30 tablet 0  . Ferrous Sulfate (IRON SUPPLEMENT PO) Take 1 tablet by mouth 2 (two) times daily. 65mg     . latanoprost (XALATAN) 0.005 % ophthalmic solution Place 1 drop into both eyes at bedtime.     Marland Kitchen lisinopril (ZESTRIL) 5 MG tablet Take 1 tablet (5 mg total) by mouth daily. 90 tablet 3  . metFORMIN (GLUCOPHAGE-XR) 500 MG 24 hr tablet TAKE 4 TABLETS(2000 MG) BY MOUTH DAILY WITH BREAKFAST 360 tablet 0  . Naproxen Sodium (ALEVE PO) Take 2 tablets by mouth daily.     . nitrofurantoin, macrocrystal-monohydrate, (MACROBID) 100 MG capsule Take 1 capsule (100 mg total) by mouth 2 (two) times daily. 10 capsule 0  . omeprazole (PRILOSEC) 40 MG capsule Take 1 capsule (40 mg total) by mouth daily. 90 capsule 3  . repaglinide (PRANDIN) 2 MG tablet TAKE 1 TABLET(2 MG) BY MOUTH THREE TIMES DAILY BEFORE MEALS 270 tablet 0  . rosuvastatin (CRESTOR) 40 MG tablet Take 1 tablet (40 mg total) by mouth at bedtime. 90 tablet 1  . triamcinolone cream (KENALOG) 0.1 % Apply 1 application topically 2 (two) times daily. 30 g 0   No current facility-administered medications on file prior to visit.    Allergies  Allergen Reactions  . Codeine Nausea Only    Severe nausea    Family History  Problem Relation Age of Onset  . Dementia Mother   . Breast cancer Mother 86  . Kidney failure Mother 24       died from kidney failure from abx given to her for pneumonia  . Hyperlipidemia Mother   . Hypertension Mother   . Diabetes Mother   . Cancer Mother 8       breast  . Parkinsonism Father   . Diabetes Father 21  . Hypertension Sister   . Diabetes Brother   . Hypertension Brother   . Heart attack Brother   . Heart disease Cousin 73       mi  . Colon cancer Neg Hx     BP (!)  148/78   Pulse 72   Ht 5\' 6"  (1.676 m)   Wt 164 lb (74.4 kg)  SpO2 99%   BMI 26.47 kg/m    Review of Systems     Objective:   Physical Exam VITAL SIGNS:  See vs page GENERAL: no distress Pulses: dorsalis pedis intact bilat.   MSK: no deformity of the feet CV: no leg edema Skin:  no ulcer on the feet.  normal color and temp on the feet. Neuro: sensation is intact to touch on the feet    A1c=7.2%    Assessment & Plan:  Type 2 DM, with PN: worse HTN: is noted today  Patient Instructions  Your blood pressure is high today.  Please see your primary care provider soon, to have it rechecked I have sent a prescription to your pharmacy, to increase the acarbose. Please continue the same other diabetes medications.  check your blood sugar once a day.  vary the time of day when you check, between before the 3 meals, and at bedtime.  also check if you have symptoms of your blood sugar being too high or too low.  please keep a record of the readings and bring it to your next appointment here (or you can bring the meter itself).  You can write it on any piece of paper.  please call us sooner if your blood sugar goes below 70, or if you have a lot of readings over 200. Please come back for a follow-up appointment in 3 months.

## 2020-01-21 NOTE — Patient Instructions (Addendum)
Your blood pressure is high today.  Please see your primary care provider soon, to have it rechecked I have sent a prescription to your pharmacy, to increase the acarbose. Please continue the same other diabetes medications.  check your blood sugar once a day.  vary the time of day when you check, between before the 3 meals, and at bedtime.  also check if you have symptoms of your blood sugar being too high or too low.  please keep a record of the readings and bring it to your next appointment here (or you can bring the meter itself).  You can write it on any piece of paper.  please call us sooner if your blood sugar goes below 70, or if you have a lot of readings over 200. Please come back for a follow-up appointment in 3 months.

## 2020-02-10 ENCOUNTER — Ambulatory Visit: Payer: Medicare Other | Admitting: Pharmacist

## 2020-02-10 ENCOUNTER — Other Ambulatory Visit: Payer: Self-pay | Admitting: Family Medicine

## 2020-02-10 DIAGNOSIS — E1151 Type 2 diabetes mellitus with diabetic peripheral angiopathy without gangrene: Secondary | ICD-10-CM

## 2020-02-10 DIAGNOSIS — K219 Gastro-esophageal reflux disease without esophagitis: Secondary | ICD-10-CM

## 2020-02-10 DIAGNOSIS — E1165 Type 2 diabetes mellitus with hyperglycemia: Secondary | ICD-10-CM

## 2020-02-10 DIAGNOSIS — IMO0002 Reserved for concepts with insufficient information to code with codable children: Secondary | ICD-10-CM

## 2020-02-10 MED ORDER — OMEPRAZOLE 20 MG PO CPDR: 20.0000 mg | DELAYED_RELEASE_CAPSULE | Freq: Every day | ORAL | 3 refills | Status: DC

## 2020-02-10 NOTE — Patient Instructions (Addendum)
Visit Information  Goals Addressed            This Visit's Progress   . Chronic Care Management Pharmacy Care Plan       CARE PLAN ENTRY (see longitudinal plan of care for additional care plan information)  Current Barriers:  . Chronic Disease Management support, education, and care coordination needs related to Diabetes, Hypertension, Hyperlipidemia, Nerve Pain, Anxiety, Glaucoma, GERD, Insomnia   Hypertension BP Readings from Last 3 Encounters:  01/21/20 (!) 148/78  01/06/20 135/74  01/06/20 135/74   . Pharmacist Clinical Goal(s): o Over the next 90 days, patient will work with PharmD and providers to maintain BP goal <140/90 . Current regimen:  o Lisinopril 5mg  daily AM . Patient self care activities - Over the next 90 days, patient will: o Check BP 1-2 times per week, document, and provide at future appointments o Ensure daily salt intake < 2300 mg/day  Hyperlipidemia Lab Results  Component Value Date/Time   LDLCALC 59 09/22/2019 10:40 AM   LDLDIRECT 103.0 04/08/2018 03:24 PM   . Pharmacist Clinical Goal(s): o Over the next 90 days, patient will work with PharmD and providers to maintain LDL goal < 100 . Current regimen:  o Rosuvastatin 40mg  daily AM o Coenzyne Q10 200mg  daily . Interventions: o Discussed taking rosuvastatin in the morning to help remember to take it . Patient self care activities - Over the next 90 days, patient will: o Maintain cholesterol medication regimen.   Diabetes Lab Results  Component Value Date/Time   HGBA1C 7.2 (A) 01/21/2020 08:36 AM   HGBA1C 6.9 (H) 09/22/2019 10:40 AM   HGBA1C 6.4 (A) 09/09/2019 07:53 AM   HGBA1C 7.4 (H) 11/23/2016 10:04 AM   . Pharmacist Clinical Goal(s): o Over the next 90 days, patient will work with PharmD and providers to achieve A1c goal <7% . Current regimen:   Acarbose 50mg  TID (increased at endo visit 01/21/20)  Metformin XR 500 #4 daily AM  Repaglinide 2mg  TID with meals  Bromocriptine 2.5mg   daily . Patient self care activities - Over the next 90 days, patient will: o Check blood sugar 3-4 times daily, document, and provide at future appointments o Contact provider with any episodes of hypoglycemia  GERD . Pharmacist Clinical Goal(s) o Over the next 90 days, patient will work with PharmD and providers to reduce symptoms of GERD . Current regimen:  o Omeprazole 40mg  daily . Interventions: o Consider reducing omeprazole to 20mg  daily . Patient self care activities - Over the next 90 days, patient will: o Maintain GERD medication regimen  Medication management . Pharmacist Clinical Goal(s): o Over the next 90 days, patient will work with PharmD and providers to maintain optimal medication adherence . Current pharmacy: [facility] . Interventions o Comprehensive medication review performed. o Continue current medication management strategy . Patient self care activities - Over the next 90 days, patient will: o Focus on medication adherence by filling medications appropriately  o Take medications as prescribed o Report any questions or concerns to PharmD and/or provider(s)  Please see past updates related to this goal by clicking on the "Past Updates" button in the selected goal         The patient verbalized understanding of instructions provided today and agreed to receive a mailed copy of patient instruction and/or educational materials.  Telephone follow up appointment with pharmacy team member scheduled for: 05/13/2020  Melvenia Beam Day, PharmD Clinical Pharmacist [facility] [phone]   Food Choices  for Gastroesophageal Reflux Disease, Adult When you have gastroesophageal reflux disease (GERD), the foods you eat and your eating habits are very important. Choosing the right foods can help ease your discomfort. Think about working with a nutrition specialist (dietitian) to help you make good choices. What are tips for following this  plan?  Meals  Choose healthy foods that are low in fat, such as fruits, vegetables, whole grains, low-fat dairy products, and lean meat, fish, and poultry.  Eat small meals often instead of 3 large meals a day. Eat your meals slowly, and in a place where you are relaxed. Avoid bending over or lying down until 2-3 hours after eating.  Avoid eating meals 2-3 hours before bed.  Avoid drinking a lot of liquid with meals.  Cook foods using methods other than frying. Bake, grill, or broil food instead.  Avoid or limit: ? Chocolate. ? Peppermint or spearmint. ? Alcohol. ? Pepper. ? Black and decaffeinated coffee. ? Black and decaffeinated tea. ? Bubbly (carbonated) soft drinks. ? Caffeinated energy drinks and soft drinks.  Limit high-fat foods such as: ? Fatty meat or fried foods. ? Whole milk, cream, butter, or ice cream. ? Nuts and nut butters. ? Pastries, donuts, and sweets made with butter or shortening.  Avoid foods that cause symptoms. These foods may be different for everyone. Common foods that cause symptoms include: ? Tomatoes. ? Oranges, lemons, and limes. ? Peppers. ? Spicy food. ? Onions and garlic. ? Vinegar. Lifestyle  Maintain a healthy weight. Ask your doctor what weight is healthy for you. If you need to lose weight, work with your doctor to do so safely.  Exercise for at least 30 minutes for 5 or more days each week, or as told by your doctor.  Wear loose-fitting clothes.  Do not smoke. If you need help quitting, ask your doctor.  Sleep with the head of your bed higher than your feet. Use a wedge under the mattress or blocks under the bed frame to raise the head of the bed. Summary  When you have gastroesophageal reflux disease (GERD), food and lifestyle choices are very important in easing your symptoms.  Eat small meals often instead of 3 large meals a day. Eat your meals slowly, and in a place where you are relaxed.  Limit high-fat foods such as  fatty meat or fried foods.  Avoid bending over or lying down until 2-3 hours after eating.  Avoid peppermint and spearmint, caffeine, alcohol, and chocolate. This information is not intended to replace advice given to you by your health care provider. Make sure you discuss any questions you have with your health care provider. Document Revised: 10/03/2018 Document Reviewed: 07/18/2016 Elsevier Patient Education  [place].

## 2020-02-10 NOTE — Chronic Care Management (AMB) (Signed)
Chronic Care Management Pharmacy  Name: Alyssa Ford  MRN: [id number] DOB: 13-Mar-1951  Chief Complaint/ HPI  Alyssa Ford Brothers,  69 y.o. , female presents for their Follow-Up CCM visit with the clinical pharmacist via telephone due to COVID-19 Pandemic.  PCP : Alyssa Held, DO  Their chronic conditions include: Diabetes, Hypertension, Hyperlipidemia, Nerve Pain, Anxiety, Glaucoma, GERD, Insomnia  Office Visits: 01/06/20: Visit w/ Dr. Etter Ford - Odor of urine. Urinalysis unremarkable.  01/06/20: Medicare Annual Wellness Exam w/ Alyssa Plummer, RN  Consult Visit: 01/21/20: Endo visit w/ Dr. Loanne Ford - Increase acarbose to 50mg  TID. RTC 3 months.  ED Visit 12/31/19: [facility] - Lower UTI. Prescribed nitrofurantoin monohydrate 100mg  BID x 5D  Medications: Outpatient Encounter Medications as of 02/10/2020  Medication Sig Note  . acarbose (PRECOSE) 50 MG tablet Take 1 tablet (50 mg total) by mouth 3 (three) times daily with meals.   Marland Kitchen amitriptyline (ELAVIL) 25 MG tablet Take 2 tablets (50 mg total) by mouth daily.   Marland Kitchen aspirin EC 81 MG tablet Take 81 mg by mouth daily.   Marland Kitchen BIOTIN PO Take 1 tablet by mouth daily.    . bromocriptine (PARLODEL) 2.5 MG tablet TAKE 1 TABLET(2.5 MG) BY MOUTH DAILY   . buPROPion (WELLBUTRIN XL) 150 MG 24 hr tablet Take 2 tablets by mouth once every morning   . Coenzyme Q10 (CO Q 10 PO) Take 1 capsule by mouth daily.   . diazepam (VALIUM) 5 MG tablet 1 po qhs prn   . Ferrous Sulfate (IRON SUPPLEMENT PO) Take 1 tablet by mouth 2 (two) times daily. 65mg    . latanoprost (XALATAN) 0.005 % ophthalmic solution Place 1 drop into both eyes at bedtime.  10/24/2013: Received from: External Pharmacy  . lisinopril (ZESTRIL) 5 MG tablet Take 1 tablet (5 mg total) by mouth daily.   . metFORMIN (GLUCOPHAGE-XR) 500 MG 24 hr tablet TAKE 4 TABLETS(2000 MG) BY MOUTH DAILY WITH BREAKFAST   . Naproxen Sodium (ALEVE PO) Take 2 tablets by mouth daily.    .  nitrofurantoin, macrocrystal-monohydrate, (MACROBID) 100 MG capsule Take 1 capsule (100 mg total) by mouth 2 (two) times daily.   Marland Kitchen omeprazole (PRILOSEC) 40 MG capsule Take 1 capsule (40 mg total) by mouth daily.   . repaglinide (PRANDIN) 2 MG tablet TAKE 1 TABLET(2 MG) BY MOUTH THREE TIMES DAILY BEFORE MEALS   . rosuvastatin (CRESTOR) 40 MG tablet Take 1 tablet (40 mg total) by mouth at bedtime.   . triamcinolone cream (KENALOG) 0.1 % Apply 1 application topically 2 (two) times daily.    No facility-administered encounter medications on file as of 02/10/2020.   SDOH Screenings   Alcohol Screen:   . Last Alcohol Screening Score (AUDIT):   Depression (PHQ2-9): Low Risk   . PHQ-2 Score: 0  Financial Resource Strain: Low Risk   . Difficulty of Paying Living Expenses: Not hard at all  Food Insecurity: No Food Insecurity  . Worried About Charity fundraiser in the Last Year: Never true  . Ran Out of Food in the Last Year: Never true  Housing: Low Risk   . Last Housing Risk Score: 0  Physical Activity:   . Days of Exercise per Week:   . Minutes of Exercise per Session:   Social Connections:   . Frequency of Communication with Friends and Family:   . Frequency of Social Gatherings with Friends and Family:   . Attends Religious Services:   .  Active Member of Clubs or Organizations:   . Attends Archivist Meetings:   Marland Kitchen Marital Status:   Stress:   . Feeling of Stress :   Tobacco Use: Medium Risk  . Smoking Tobacco Use: Former Smoker  . Smokeless Tobacco Use: Never Used  Transportation Needs: No Transportation Needs  . Lack of Transportation (Medical): No  . Lack of Transportation (Non-Medical): No    Current Diagnosis/Assessment:  Goals Addressed            This Visit's Progress   . Chronic Care Management Pharmacy Care Plan       CARE PLAN ENTRY (see longitudinal plan of care for additional care plan information)  Current Barriers:  . Chronic Disease Management  support, education, and care coordination needs related to Diabetes, Hypertension, Hyperlipidemia, Nerve Pain, Anxiety, Glaucoma, GERD, Insomnia   Hypertension BP Readings from Last 3 Encounters:  01/21/20 (!) 148/78  01/06/20 135/74  01/06/20 135/74   . Pharmacist Clinical Goal(s): o Over the next 90 days, patient will work with PharmD and providers to maintain BP goal <140/90 . Current regimen:  o Lisinopril 5mg  daily AM . Patient self care activities - Over the next 90 days, patient will: o Check BP 1-2 times per week, document, and provide at future appointments o Ensure daily salt intake < 2300 mg/Alyssa Ford  Hyperlipidemia Lab Results  Component Value Date/Time   LDLCALC 59 09/22/2019 10:40 AM   LDLDIRECT 103.0 04/08/2018 03:24 PM   . Pharmacist Clinical Goal(s): o Over the next 90 days, patient will work with PharmD and providers to maintain LDL goal < 100 . Current regimen:  o Rosuvastatin 40mg  daily AM o Coenzyne Q10 200mg  daily . Interventions: o Discussed taking rosuvastatin in the morning to help remember to take it . Patient self care activities - Over the next 90 days, patient will: o Maintain cholesterol medication regimen.   Diabetes Lab Results  Component Value Date/Time   HGBA1C 7.2 (A) 01/21/2020 08:36 AM   HGBA1C 6.9 (H) 09/22/2019 10:40 AM   HGBA1C 6.4 (A) 09/09/2019 07:53 AM   HGBA1C 7.4 (H) 11/23/2016 10:04 AM   . Pharmacist Clinical Goal(s): o Over the next 90 days, patient will work with PharmD and providers to achieve A1c goal <7% . Current regimen:   Acarbose 50mg  TID (increased at endo visit 01/21/20)  Metformin XR 500 #4 daily AM  Repaglinide 2mg  TID with meals  Bromocriptine 2.5mg  daily . Patient self care activities - Over the next 90 days, patient will: o Check blood sugar 3-4 times daily, document, and provide at future appointments o Contact provider with any episodes of hypoglycemia  GERD . Pharmacist Clinical Goal(s) o Over the next  90 days, patient will work with PharmD and providers to reduce symptoms of GERD . Current regimen:  o Omeprazole 40mg  daily . Interventions: o Consider reducing omeprazole to 20mg  daily . Patient self care activities - Over the next 90 days, patient will: o Maintain GERD medication regimen  Medication management . Pharmacist Clinical Goal(s): o Over the next 90 days, patient will work with PharmD and providers to maintain optimal medication adherence . Current pharmacy: [facility] . Interventions o Comprehensive medication review performed. o Continue current medication management strategy . Patient self care activities - Over the next 90 days, patient will: o Focus on medication adherence by filling medications appropriately  o Take medications as prescribed o Report any questions or concerns to PharmD and/or provider(s)  Please see past updates related  to this goal by clicking on the "Past Updates" button in the selected goal        Social Hx:  Originally from [place]. Married 49 years. 3 children. 2 grandchildren. 1 great grandchild 6 weeks.   Uses pill box  Diabetes   Recent Relevant Labs: Lab Results  Component Value Date/Time   HGBA1C 7.2 (A) 01/21/2020 08:36 AM   HGBA1C 6.9 (H) 09/22/2019 10:40 AM   HGBA1C 6.4 (A) 09/09/2019 07:53 AM   HGBA1C 7.4 (H) 11/23/2016 10:04 AM   MICROALBUR 1.1 04/17/2019 08:48 AM   MICROALBUR 1.1 04/08/2018 03:24 PM    A1c goal <7% FBG: 80-130 PPBG: <180  Checking BG: Daily (about 5 times per week)  Recent FBG Readings: Usually run in 120s per patient Highest: 210 an hour after eating Lowest: 90-100 No symptoms of hypoglycemia  Patient has failed these meds in past: glimepiride (efficacy?), januvia (cost) Patient is currently uncontrolled on the following medications:   Acarbose 50mg  TID (increased at endo visit 01/21/20)  Metformin XR 500 #4 daily AM  Repaglinide 2mg  TID with meals  Bromocriptine 2.5mg  daily (started by Endo  about 3 years ago)  Paying $150 for 3 month supply of bromocriptine  Last diabetic Eye exam:  Lab Results  Component Value Date/Time   HMDIABEYEEXA Retinopathy (A) 06/17/2018 12:00 AM  Last seen November 2020 per patient (Dr. Gala Romney)   Last diabetic Foot exam:  Lab Results  Component Value Date/Time   HMDIABFOOTEX normal 06/06/2010 12:00 AM   Dr. Loanne Ford performed during 09/09/19 visit per patient  Followed by Endo (Dr. Hale Bogus).   Used medicare.gov after visit to determine drug cost for first line DM therapies and it appears cost could be a barrier.  Patient would quickly approach the coverage gap (~2 months) if she were to be prescribed Jardiance or Ozempic. This would present a barrier to adherence.   We discussed: possibility of other medications to consider for replacement of her current regimen (espcially noting once daily and once weekly options), but it appears cost may be a barrier.    Update 12/11/19 Is walking 1.5 miles most days.  Denies any s/sx of hypoglycemia. Can feel hypoglycemia less than 90.   Update 02/10/20 Discussed increase of acarbose per Dr. Loanne Ford. Pt states she did increase dose. Noted increase in a1c and patient states she was on vacation.  Checks BG 1-2 times per Treavor Blomquist.  Reports BG 110s-120s Feels like she had a low this week.  States she needs a new meter.   Plan -Continue current medications  -Pharmacy team to follow up in about 4 weeks to assess specific BG readings  Future Plan -See if patient would be eligible for patient assistance that could allow her to utilize 1st line options for DM (SGLT2 Inhibitor or GLP1 Agonist)    GERD    Patient has failed these meds in past: None noted  Patient is currently controlled on the following medications:   Omeprazole 40mg  daily  Breakthrough Sx: Only when she forgets to take her medicine Triggers: Sausage, bacon, greasy foods, spicy food  She is interested in seeing if she can get symptom  control with 20mg  daily  Update 12/11/19 Got a 90 supply of 40mg  so has not worked on tapering plan. Will wait until patient runs out of current supply before starting tapering plan  We discussed:  Risk/benefit of long term PPI use   Update 02/10/20 She would like to see if she can tolerate 20mg  dose of  omeprazole. She has about a 1 week supply of omeprazole 40mg  remaining.   Plan -Consider starting omeprazole 20mg  daily at next refill pending Dr. Etter Ford approval -Pharmacy team to follow up in about 4 weeks to assess if she is tolerating lower dose.  Supplementation   She has a question about whether she can use slimfast to replace.  Biotin 10,080mcg (slimfast only has 52mcg) Ferrous Sulfate 65mg  BID (slimfast only has 6mg ) Vitamin B12 3075mcg SL (slimfast only has 2.5mcg)   Discussed the current amount she is taking of each supplement vs the amount included in slimfast. Noting significant difference, patient opts to continue her current supplementation.   Plan -Continue current medications   Miscellaneous Meds Biotin 10,027mcg (for scarring alopecia) Ferrous Sulfate 65mg  BID (hx of anemia per pt) Vitamin B12 3085mcg SL (was previously getting injections) Triamcinolone Prevagen

## 2020-02-11 ENCOUNTER — Other Ambulatory Visit: Payer: Self-pay | Admitting: Family Medicine

## 2020-02-11 DIAGNOSIS — F411 Generalized anxiety disorder: Secondary | ICD-10-CM

## 2020-02-11 NOTE — Telephone Encounter (Signed)
Diazepam refill.   Last OV: 01/06/2020 Last Fill: 08/21/2019 #30 and 0RF Pt sig: 1 tab qhs prn UDS: 03/22/2018 Low risk

## 2020-03-10 ENCOUNTER — Telehealth: Payer: Self-pay | Admitting: Pharmacist

## 2020-03-10 NOTE — Progress Notes (Addendum)
Chronic Care Management Pharmacy Assistant   Name: FARYAL MARXEN  MRN: [id number] DOB: [birth year]  Reason for Encounter: Disease State  Patient Questions:  1.  Have you seen any other providers since your last visit? No  2.  Any changes in your medicines or health? No  PCP : Ann Held, DO   Their chronic conditions include: Diabetes, Hypertension, Hyperlipidemia, Nerve Pain, Anxiety, Glaucoma, GERD, Insomnia.  There have been OV's, Consults, or Hospitalizations since their last CCM visit with the clinical pharmacist on 02-10-2020.  Allergies:   Allergies  Allergen Reactions   Codeine Nausea Only    Severe nausea    Medications: Outpatient Encounter Medications as of 03/10/2020  Medication Sig Note   acarbose (PRECOSE) 50 MG tablet Take 1 tablet (50 mg total) by mouth 3 (three) times daily with meals.    amitriptyline (ELAVIL) 25 MG tablet Take 2 tablets (50 mg total) by mouth daily.    aspirin EC 81 MG tablet Take 81 mg by mouth daily.    BIOTIN PO Take 1 tablet by mouth daily.     bromocriptine (PARLODEL) 2.5 MG tablet TAKE 1 TABLET(2.5 MG) BY MOUTH DAILY    buPROPion (WELLBUTRIN XL) 150 MG 24 hr tablet Take 2 tablets by mouth once every morning    Coenzyme Q10 (CO Q 10 PO) Take 1 capsule by mouth daily.    diazepam (VALIUM) 5 MG tablet TAKE 1 TABLET BY MOUTH EVERY NIGHT AT BEDTIME AS NEEDED    Ferrous Sulfate (IRON SUPPLEMENT PO) Take 1 tablet by mouth 2 (two) times daily. $RemoveBefo'65mg'BBjclTmWsSV$     latanoprost (XALATAN) 0.005 % ophthalmic solution Place 1 drop into both eyes at bedtime.  10/24/2013: Received from: External Pharmacy   lisinopril (ZESTRIL) 5 MG tablet Take 1 tablet (5 mg total) by mouth daily.    metFORMIN (GLUCOPHAGE-XR) 500 MG 24 hr tablet TAKE 4 TABLETS(2000 MG) BY MOUTH DAILY WITH BREAKFAST    Naproxen Sodium (ALEVE PO) Take 2 tablets by mouth daily.     nitrofurantoin, macrocrystal-monohydrate, (MACROBID) 100 MG capsule Take 1 capsule (100 mg  total) by mouth 2 (two) times daily.    omeprazole (PRILOSEC) 20 MG capsule Take 1 capsule (20 mg total) by mouth daily.    omeprazole (PRILOSEC) 40 MG capsule Take 1 capsule (40 mg total) by mouth daily.    repaglinide (PRANDIN) 2 MG tablet TAKE 1 TABLET(2 MG) BY MOUTH THREE TIMES DAILY BEFORE MEALS    rosuvastatin (CRESTOR) 40 MG tablet Take 1 tablet (40 mg total) by mouth at bedtime.    triamcinolone cream (KENALOG) 0.1 % Apply 1 application topically 2 (two) times daily.    No facility-administered encounter medications on file as of 03/10/2020.    Current Diagnosis: Patient Active Problem List   Diagnosis Date Noted   Vaginal odor 01/11/2020   Icterus 01/11/2020   Uncontrolled type 2 diabetes mellitus with hyperglycemia ([place]) 09/22/2019   De Quervain's disease (tenosynovitis) 09/22/2019   Neck mass 07/11/2018   Abnormal WBC count 05/29/2017   Skin fissure 09/24/2015   Allergic rhinitis 03/26/2014   Wheezing 03/26/2014   DM (diabetes mellitus) type II uncontrolled, periph vascular disorder ([place]) 08/25/2011   Headache(784.0) 08/21/2011   Acute sinusitis 04/26/2010   URI 04/26/2010   UNSPECIFIED CATARACT 02/23/2010   POSTMENOPAUSAL STATUS 12/12/2007   FATIGUE 09/11/2007   THYROID NODULE 11/22/2006   Diabetes ([place]) 11/22/2006   Hyperlipidemia LDL goal <70 11/22/2006   GLUCOMA 11/22/2006   Essential hypertension  11/22/2006   GERD 11/22/2006    Goals Addressed   None    Recent Relevant Labs: Lab Results  Component Value Date/Time   HGBA1C 7.2 (A) 01/21/2020 08:36 AM   HGBA1C 6.9 (H) 09/22/2019 10:40 AM   HGBA1C 6.4 (A) 09/09/2019 07:53 AM   HGBA1C 7.4 (H) 11/23/2016 10:04 AM   MICROALBUR 1.1 04/17/2019 08:48 AM   MICROALBUR 1.1 04/08/2018 03:24 PM    Kidney Function Lab Results  Component Value Date/Time   CREATININE 0.80 01/06/2020 03:32 PM   CREATININE 0.77 09/22/2019 10:40 AM   GFR 70.95 01/06/2020 03:32 PM   GFRNONAA 112.63  06/06/2010 08:46 AM   GFRAA  12/23/2007 01:55 PM    >60        The eGFR has been calculated using the MDRD equation. This calculation has not been validated in all clinical     Current antihyperglycemic regimen:   Acarbose $RemoveB'50mg'oUQUQbQD$  TID (increased at endo visit 01/21/20)  Metformin XR 500 #4 daily AM  Repaglinide $RemoveBefo'2mg'kmBwKZMlagv$  TID with meals  Bromocriptine 2.$RemoveBeforeDE'5mg'SxkCCXwiPuahvEX$  daily   What recent interventions/DTPs have been made to improve glycemic control:  o None since last CCM visit on 02-10-2020  Have there been any recent hospitalizations or ED visits since last visit with CPP? No  Patient denies hypoglycemic symptoms, including None  Patient reports hyperglycemic symptoms, including polyuria  How often are you checking your blood sugar? once daily  What are your blood sugars ranging?  Patient was not home at the time of the phone call. She says her BG meter has not been working properly and she was out at [employer] getting a new one. She did not have readings to provide.  During the week, how often does your blood glucose drop below 70? Patient states her sugar has not dropped below 70 in a long time.  Are you checking your feet daily/regularly? Yes, no concerns.  Have you started taking lower does of omeprazole 20 mg? Patient stated she started taking the decreased dose about a month ago. So far she has been tolerating it well so far.  Are you having increased acid reflux symptoms with the decreased dose? Patient stated there has not been an increase in symptoms since taking the lower dose. When she has red wine symptoms flare up.  Patient had a concern about the Metformin medication. She states the smell of the pills are "awful" and she believes has caused a foul smell in her urine. She inquired if this was a normal side effect of that medication. Advised that I would inform the clinical pharmacist.  Adherence Review: Is the patient currently on a STATIN medication? Yes Is the patient currently  on ACE/ARB medication? Yes Does the patient have >5 day gap between last estimated fill dates? No    Follow-Up:  Pharmacist Review   Fanny Skates, [place] Pharmacist Assistant [phone]  There are instances reported of metformin causing abnormal urine smells,

## 2020-03-24 ENCOUNTER — Telehealth: Payer: Self-pay | Admitting: Pharmacist

## 2020-03-24 NOTE — Progress Notes (Addendum)
Verified Adherence Gap Information. [employer] MEDICARE insurance data states patient is compliance is 100% with Metformin 500 mg, Lisinopril 5 mg, and rosuvastatin 40 mg.  [place] Shannon, [facility] Pharmacist Assistant [phone]  Reviewed by: De Blanch, PharmD Clinical Pharmacist [facility] [phone]

## 2020-03-25 ENCOUNTER — Other Ambulatory Visit: Payer: Self-pay | Admitting: Endocrinology

## 2020-03-25 ENCOUNTER — Other Ambulatory Visit: Payer: Self-pay | Admitting: Family Medicine

## 2020-03-25 DIAGNOSIS — E1165 Type 2 diabetes mellitus with hyperglycemia: Secondary | ICD-10-CM

## 2020-03-25 DIAGNOSIS — E1151 Type 2 diabetes mellitus with diabetic peripheral angiopathy without gangrene: Secondary | ICD-10-CM

## 2020-03-25 DIAGNOSIS — E118 Type 2 diabetes mellitus with unspecified complications: Secondary | ICD-10-CM

## 2020-03-25 DIAGNOSIS — IMO0002 Reserved for concepts with insufficient information to code with codable children: Secondary | ICD-10-CM

## 2020-04-08 ENCOUNTER — Encounter: Payer: Self-pay | Admitting: Family Medicine

## 2020-04-08 ENCOUNTER — Ambulatory Visit (INDEPENDENT_AMBULATORY_CARE_PROVIDER_SITE_OTHER): Payer: Medicare Other | Admitting: Family Medicine

## 2020-04-08 ENCOUNTER — Other Ambulatory Visit: Payer: Self-pay

## 2020-04-08 ENCOUNTER — Other Ambulatory Visit (HOSPITAL_COMMUNITY)
Admission: RE | Admit: 2020-04-08 | Discharge: 2020-04-08 | Disposition: A | Discharge status: Home / Self Care | Payer: Medicare Other | Source: Ambulatory Visit | Attending: Family Medicine | Admitting: Family Medicine

## 2020-04-08 VITALS — BP 110/68 | HR 73 | Temp 97.7°F | Resp 18 | Ht 66.0 in | Wt 162.6 lb

## 2020-04-08 DIAGNOSIS — R8290 Unspecified abnormal findings in urine: Secondary | ICD-10-CM

## 2020-04-08 DIAGNOSIS — E1151 Type 2 diabetes mellitus with diabetic peripheral angiopathy without gangrene: Secondary | ICD-10-CM

## 2020-04-08 DIAGNOSIS — Z23 Encounter for immunization: Secondary | ICD-10-CM

## 2020-04-08 DIAGNOSIS — G4700 Insomnia, unspecified: Secondary | ICD-10-CM | POA: Diagnosis not present

## 2020-04-08 DIAGNOSIS — N898 Other specified noninflammatory disorders of vagina: Secondary | ICD-10-CM

## 2020-04-08 DIAGNOSIS — E1165 Type 2 diabetes mellitus with hyperglycemia: Secondary | ICD-10-CM

## 2020-04-08 DIAGNOSIS — E1169 Type 2 diabetes mellitus with other specified complication: Secondary | ICD-10-CM

## 2020-04-08 DIAGNOSIS — E785 Hyperlipidemia, unspecified: Secondary | ICD-10-CM

## 2020-04-08 DIAGNOSIS — I10 Essential (primary) hypertension: Secondary | ICD-10-CM | POA: Diagnosis not present

## 2020-04-08 DIAGNOSIS — IMO0002 Reserved for concepts with insufficient information to code with codable children: Secondary | ICD-10-CM

## 2020-04-08 LAB — POC URINALSYSI DIPSTICK (AUTOMATED)
Bilirubin, UA: NEGATIVE
Blood, UA: NEGATIVE
Glucose, UA: NEGATIVE
Ketones, UA: NEGATIVE
Nitrite, UA: NEGATIVE
Protein, UA: NEGATIVE
Spec Grav, UA: 1.015 (ref 1.010–1.025)
Urobilinogen, UA: 0.2 E.U./dL
pH, UA: 6 (ref 5.0–8.0)

## 2020-04-08 MED ORDER — AMITRIPTYLINE HCL 25 MG PO TABS: 50.0000 mg | ORAL_TABLET | Freq: Every day | ORAL | 1 refills | Status: DC

## 2020-04-08 NOTE — Assessment & Plan Note (Signed)
Encouraged heart healthy diet, increase exercise, avoid trans fats, consider a krill oil cap daily 

## 2020-04-08 NOTE — Assessment & Plan Note (Signed)
Check urine  Check cervicoancillary

## 2020-04-08 NOTE — Patient Instructions (Signed)

## 2020-04-08 NOTE — Assessment & Plan Note (Signed)
Well controlled, no changes to meds. Encouraged heart healthy diet such as the DASH diet and exercise as tolerated.  con't lisinopril 

## 2020-04-08 NOTE — Assessment & Plan Note (Signed)
Per endo °

## 2020-04-08 NOTE — Progress Notes (Signed)
Patient ID: Alyssa Ford, female    DOB: 05-31-1951  Age: 69 y.o. MRN: [id number]    Subjective:  Subjective  HPI Alyssa Ford presents for f/u bp and cholesterol.   She also c/o odor in urine ---- no burning or frequency.   No d/c .  No back pain  Pt would also like her flu shot.    No other complaints   Review of Systems  Constitutional: Negative for appetite change, diaphoresis, fatigue and unexpected weight change.  Eyes: Negative for pain, redness and visual disturbance.  Respiratory: Negative for cough, chest tightness, shortness of breath and wheezing.   Cardiovascular: Negative for chest pain, palpitations and leg swelling.  Endocrine: Negative for cold intolerance, heat intolerance, polydipsia, polyphagia and polyuria.  Genitourinary: Negative for decreased urine volume, difficulty urinating, dysuria, frequency, urgency, vaginal bleeding, vaginal discharge and vaginal pain.       Vaginal odor  Neurological: Negative for dizziness, light-headedness, numbness and headaches.    History Past Medical History:  Diagnosis Date  . Anemia   . Anxiety   . Arthritis   . Cataract   . Chronic headaches   . Depression   . Diabetes mellitus   . Diverticulosis of colon (without mention of hemorrhage) 2003, 2008   Colonoscopy  . GERD (gastroesophageal reflux disease)   . Glaucoma   . Hiatal hernia 2003   EGD   . Hyperlipidemia   . Hypertension   . Thyroid nodule     She has a past surgical history that includes Cholecystectomy; Carpal tunnel release (2006); Carpal tunnel release (12/24/2007); Cervical fusion (3/08); Cataract extraction, bilateral; Fasciectomy (Left, 08/01/2018); and Trigger finger release (Left, 08/01/2018).   Her family history includes Breast cancer (age of onset: 67) in her mother; Cancer (age of onset: 98) in her mother; Dementia in her mother; Diabetes in her brother and mother; Diabetes (age of onset: 61) in her father; Heart attack in her brother; Heart  disease (age of onset: 49) in her cousin; Hyperlipidemia in her mother; Hypertension in her brother, mother, and sister; Kidney failure (age of onset: 9) in her mother; Parkinsonism in her father.She reports that she quit smoking about 4 years ago. Her smoking use included cigarettes. She has a 68.60 pack-year smoking history. She has never used smokeless tobacco. She reports current alcohol use. She reports that she does not use drugs.  Current Outpatient Medications on File Prior to Visit  Medication Sig Dispense Refill  . acarbose (PRECOSE) 50 MG tablet Take 1 tablet (50 mg total) by mouth 3 (three) times daily with meals. 270 tablet 3  . aspirin EC 81 MG tablet Take 81 mg by mouth daily.    Marland Kitchen BIOTIN PO Take 1 tablet by mouth daily.     . bromocriptine (PARLODEL) 2.5 MG tablet TAKE 1 TABLET(2.5 MG) BY MOUTH DAILY 90 tablet 3  . Coenzyme Q10 (CO Q 10 PO) Take 1 capsule by mouth daily.    . diazepam (VALIUM) 5 MG tablet TAKE 1 TABLET BY MOUTH EVERY NIGHT AT BEDTIME AS NEEDED 30 tablet 0  . Ferrous Sulfate (IRON SUPPLEMENT PO) Take 1 tablet by mouth 2 (two) times daily. 65mg     . latanoprost (XALATAN) 0.005 % ophthalmic solution Place 1 drop into both eyes at bedtime.     Marland Kitchen lisinopril (ZESTRIL) 5 MG tablet TAKE 1 TABLET(5 MG) BY MOUTH DAILY 90 tablet 3  . metFORMIN (GLUCOPHAGE-XR) 500 MG 24 hr tablet TAKE 4 TABLETS(2000 MG) BY MOUTH DAILY  WITH BREAKFAST 360 tablet 0  . Naproxen Sodium (ALEVE PO) Take 2 tablets by mouth daily.     Marland Kitchen omeprazole (PRILOSEC) 20 MG capsule Take 1 capsule (20 mg total) by mouth daily. 30 capsule 3  . omeprazole (PRILOSEC) 40 MG capsule Take 1 capsule (40 mg total) by mouth daily. 90 capsule 3  . repaglinide (PRANDIN) 2 MG tablet TAKE 1 TABLET(2 MG) BY MOUTH THREE TIMES DAILY BEFORE MEALS 270 tablet 0  . rosuvastatin (CRESTOR) 40 MG tablet Take 1 tablet (40 mg total) by mouth at bedtime. 90 tablet 1   No current facility-administered medications on file prior to visit.      Objective:  Objective  Physical Exam Vitals and nursing note reviewed.  Constitutional:      Appearance: She is well-developed.  HENT:     Head: Normocephalic and atraumatic.  Eyes:     Conjunctiva/sclera: Conjunctivae normal.  Neck:     Thyroid: No thyromegaly.     Vascular: No carotid bruit or JVD.  Cardiovascular:     Rate and Rhythm: Normal rate and regular rhythm.     Heart sounds: Normal heart sounds. No murmur heard.   Pulmonary:     Effort: Pulmonary effort is normal. No respiratory distress.     Breath sounds: Normal breath sounds. No wheezing or rales.  Chest:     Chest wall: No tenderness.  Abdominal:     Tenderness: There is no abdominal tenderness. There is no right CVA tenderness or left CVA tenderness.  Musculoskeletal:     Cervical back: Normal range of motion and neck supple.  Neurological:     Mental Status: She is alert and oriented to person, place, and time.    BP 110/68 (BP Location: Right Arm, Patient Position: Sitting, Cuff Size: Normal)   Pulse 73   Temp 97.7 F (36.5 C) (Oral)   Resp 18   Ht 5\' 6"  (1.676 m)   Wt 162 lb 9.6 oz (73.8 kg)   SpO2 97%   BMI 26.24 kg/m  Wt Readings from Last 3 Encounters:  04/08/20 162 lb 9.6 oz (73.8 kg)  01/21/20 164 lb (74.4 kg)  01/06/20 164 lb 5.1 oz (74.5 kg)     Lab Results  Component Value Date   WBC 9.0 01/06/2020   HGB 12.9 01/06/2020   HCT 38.8 01/06/2020   PLT 260.0 01/06/2020   GLUCOSE 90 01/06/2020   CHOL 125 09/22/2019   TRIG 123.0 09/22/2019   HDL 41.70 09/22/2019   LDLDIRECT 103.0 04/08/2018   LDLCALC 59 09/22/2019   ALT 18 01/06/2020   AST 17 01/06/2020   NA 139 01/06/2020   K 4.1 01/06/2020   CL 99 01/06/2020   CREATININE 0.80 01/06/2020   BUN 17 01/06/2020   CO2 31 01/06/2020   TSH 4.23 07/08/2018   HGBA1C 7.2 (A) 01/21/2020   MICROALBUR 1.1 04/17/2019    No results found.   Assessment & Plan:  Plan  I have discontinued Dorothea H. Reeser's triamcinolone cream,  buPROPion, and nitrofurantoin (macrocrystal-monohydrate). I am also having her maintain her BIOTIN PO, Naproxen Sodium (ALEVE PO), latanoprost, Ferrous Sulfate (IRON SUPPLEMENT PO), aspirin EC, Coenzyme Q10 (CO Q 10 PO), rosuvastatin, omeprazole, bromocriptine, acarbose, omeprazole, diazepam, lisinopril, metFORMIN, repaglinide, and amitriptyline.  Meds ordered this encounter  Medications  . amitriptyline (ELAVIL) 25 MG tablet    Sig: Take 2 tablets (50 mg total) by mouth daily.    Dispense:  180 tablet    Refill:  1  Problem List Items Addressed This Visit      Unprioritized   DM (diabetes mellitus) type II uncontrolled, periph vascular disorder ([place])    Per endo      Essential hypertension    Well controlled, no changes to meds. Encouraged heart healthy diet such as the DASH diet and exercise as tolerated.   con't lisinopril       Hyperlipidemia LDL goal <70    Encouraged heart healthy diet, increase exercise, avoid trans fats, consider a krill oil cap daily      Vaginal odor    Check urine  Check cervicoancillary        Other Visit Diagnoses    Primary hypertension    -  Primary   Relevant Orders   Comprehensive metabolic panel   Lipid panel   Need for influenza vaccination       Relevant Orders   Flu Vaccine QUAD High Dose(Fluad) (Completed)   Hyperlipidemia associated with type 2 diabetes mellitus (HCC)       Relevant Orders   Comprehensive metabolic panel   Lipid panel   Insomnia, unspecified type       Relevant Medications   amitriptyline (ELAVIL) 25 MG tablet   Abnormal urine odor       Relevant Orders   POCT Urinalysis Dipstick (Automated) (Completed)   Cervicovaginal ancillary only( [place])      Follow-up: Return in about 6 months (around 10/07/2020) for annual exam, fasting.  Ann Held, DO

## 2020-04-09 ENCOUNTER — Other Ambulatory Visit: Payer: Self-pay

## 2020-04-09 LAB — CERVICOVAGINAL ANCILLARY ONLY
Bacterial Vaginitis (gardnerella): NEGATIVE
Candida Glabrata: NEGATIVE
Candida Vaginitis: NEGATIVE
Chlamydia: NEGATIVE
Comment: NEGATIVE
Comment: NEGATIVE
Comment: NEGATIVE
Comment: NEGATIVE
Comment: NEGATIVE
Comment: NORMAL
Neisseria Gonorrhea: NEGATIVE
Trichomonas: POSITIVE — AB

## 2020-04-09 MED ORDER — METRONIDAZOLE 500 MG PO TABS
500.0000 mg | ORAL_TABLET | Freq: Two times a day (BID) | ORAL | 0 refills | Status: DC
Start: 2020-04-09 — End: 2020-04-27

## 2020-04-13 DIAGNOSIS — E041 Nontoxic single thyroid nodule: Secondary | ICD-10-CM | POA: Insufficient documentation

## 2020-04-13 DIAGNOSIS — R0981 Nasal congestion: Secondary | ICD-10-CM | POA: Diagnosis not present

## 2020-04-22 ENCOUNTER — Ambulatory Visit: Payer: Medicare Other | Admitting: Endocrinology

## 2020-04-22 ENCOUNTER — Encounter: Payer: Self-pay | Admitting: Endocrinology

## 2020-04-22 ENCOUNTER — Other Ambulatory Visit: Payer: Self-pay

## 2020-04-22 VITALS — BP 130/80 | HR 79 | Ht 66.0 in | Wt 163.0 lb

## 2020-04-22 DIAGNOSIS — E1165 Type 2 diabetes mellitus with hyperglycemia: Secondary | ICD-10-CM

## 2020-04-22 DIAGNOSIS — E1151 Type 2 diabetes mellitus with diabetic peripheral angiopathy without gangrene: Secondary | ICD-10-CM | POA: Diagnosis not present

## 2020-04-22 DIAGNOSIS — IMO0002 Reserved for concepts with insufficient information to code with codable children: Secondary | ICD-10-CM

## 2020-04-22 LAB — T4, FREE: Free T4: 1.28 ng/dL (ref 0.60–1.60)

## 2020-04-22 LAB — POCT GLYCOSYLATED HEMOGLOBIN (HGB A1C): Hemoglobin A1C: 6.7 % — AB (ref 4.0–5.6)

## 2020-04-22 LAB — TSH: TSH: 5.4 u[IU]/mL — ABNORMAL HIGH (ref 0.35–4.50)

## 2020-04-22 NOTE — Patient Instructions (Addendum)
Please ask that a copy of the thyroid ultrasound be sent here Please continue the same 4 diabetes medications.  Blood tests are requested for you today.  We'll let you know about the results.  check your blood sugar once a day.  vary the time of day when you check, between before the 3 meals, and at bedtime.  also check if you have symptoms of your blood sugar being too high or too low.  please keep a record of the readings and bring it to your next appointment here (or you can bring the meter itself).  You can write it on any piece of paper.  please call us sooner if your blood sugar goes below 70, or if you have a lot of readings over 200.  Please come back for a follow-up appointment in 4 months.

## 2020-04-22 NOTE — Progress Notes (Signed)
   Subjective:    Patient ID: Alyssa Ford, female    DOB: September 28, 1950, 69 y.o.   MRN: [id number]  HPI    Review of Systems     Objective:   Physical Exam        Assessment & Plan:

## 2020-04-22 NOTE — Progress Notes (Signed)
Subjective:    Patient ID: Alyssa Ford, female    DOB: 06-28-1950, 69 y.o.   MRN: [id number]  HPI Pt returns for f/u of diabetes mellitus: DM type: 2 Dx'ed: 6720 Complications: PN and CAD (seen on CT).   Therapy: 4 oral meds GDM: never DKA: never Severe hypoglycemia: never Pancreatitis: never Pancreatic imaging: never Other: she has never been on insulin; SDOH: she wants generic meds, if possible.   Interval history: Pt states cbg's vary from 70-180.  She takes meds as rx'ed.  pt states she feels well in general.    Pt also has MNG ([place] in 2020 showed Left lower pole nodule 1 has slightly enlarged compared with greater than 2010; CT 9/20 was little changed; TSH is normal off rx).   She will have a f/u [facility] at [facility] Past Medical History:  Diagnosis Date  . Anemia   . Anxiety   . Arthritis   . Cataract   . Chronic headaches   . Depression   . Diabetes mellitus   . Diverticulosis of colon (without mention of hemorrhage) 2003, 2008   Colonoscopy  . GERD (gastroesophageal reflux disease)   . Glaucoma   . Hiatal hernia 2003   EGD   . Hyperlipidemia   . Hypertension   . Thyroid nodule     Past Surgical History:  Procedure Laterality Date  . CARPAL TUNNEL RELEASE  2006  . CARPAL TUNNEL RELEASE  12/24/2007  . CATARACT EXTRACTION, BILATERAL     [place]---  sept and oct 2018  . CERVICAL FUSION  3/08  . CHOLECYSTECTOMY    . FASCIECTOMY Left 08/01/2018   Procedure: FASCIECTOMY LEFT RING;  Surgeon: Daryll Brod, MD;  Location: [facility];  Service: Orthopedics;  Laterality: Left;  UPPER ARM  . TRIGGER FINGER RELEASE Left 08/01/2018   Procedure: RELEASE TRIGGER FINGER/A-1 PULLEY LEFTRING;  Surgeon: Daryll Brod, MD;  Location: [place];  Service: Orthopedics;  Laterality: Left;    Social History   Socioeconomic History  . Marital status: Married    Spouse name: Not on file  . Number of children: Not on file  . Years of education:  Not on file  . Highest education level: Not on file  Occupational History  . Occupation: retired  Tobacco Use  . Smoking status: Former Smoker    Packs/day: 1.40    Years: 49.00    Pack years: 68.60    Types: Cigarettes    Quit date: 07/16/2015    Years since quitting: 4.7  . Smokeless tobacco: Never Used  . Tobacco comment: e cig  Vaping Use  . Vaping Use: Every day  Substance and Sexual Activity  . Alcohol use: Yes    Alcohol/week: 0.0 standard drinks    Comment: once weekly; wine and beer  . Drug use: No  . Sexual activity: Yes    Partners: Male  Other Topics Concern  . Not on file  Social History Narrative   Exercise--  walk   Social Determinants of Health   Financial Resource Strain: Low Risk   . Difficulty of Paying Living Expenses: Not hard at all  Food Insecurity: No Food Insecurity  . Worried About Charity fundraiser in the Last Year: Never true  . Ran Out of Food in the Last Year: Never true  Transportation Needs: No Transportation Needs  . Lack of Transportation (Medical): No  . Lack of Transportation (Non-Medical): No  Physical Activity:   .  Days of Exercise per Week: Not on file  . Minutes of Exercise per Session: Not on file  Stress:   . Feeling of Stress : Not on file  Social Connections:   . Frequency of Communication with Friends and Family: Not on file  . Frequency of Social Gatherings with Friends and Family: Not on file  . Attends Religious Services: Not on file  . Active Member of Clubs or Organizations: Not on file  . Attends Archivist Meetings: Not on file  . Marital Status: Not on file  Intimate Partner Violence:   . Fear of Current or Ex-Partner: Not on file  . Emotionally Abused: Not on file  . Physically Abused: Not on file  . Sexually Abused: Not on file    Current Outpatient Medications on File Prior to Visit  Medication Sig Dispense Refill  . acarbose (PRECOSE) 50 MG tablet Take 1 tablet (50 mg total) by mouth 3  (three) times daily with meals. 270 tablet 3  . amitriptyline (ELAVIL) 25 MG tablet Take 2 tablets (50 mg total) by mouth daily. 180 tablet 1  . aspirin EC 81 MG tablet Take 81 mg by mouth daily.    Marland Kitchen BIOTIN PO Take 1 tablet by mouth daily.     . bromocriptine (PARLODEL) 2.5 MG tablet TAKE 1 TABLET(2.5 MG) BY MOUTH DAILY 90 tablet 3  . Coenzyme Q10 (CO Q 10 PO) Take 1 capsule by mouth daily.    . diazepam (VALIUM) 5 MG tablet TAKE 1 TABLET BY MOUTH EVERY NIGHT AT BEDTIME AS NEEDED 30 tablet 0  . Ferrous Sulfate (IRON SUPPLEMENT PO) Take 1 tablet by mouth 2 (two) times daily. 65mg     . latanoprost (XALATAN) 0.005 % ophthalmic solution Place 1 drop into both eyes at bedtime.     Marland Kitchen lisinopril (ZESTRIL) 5 MG tablet TAKE 1 TABLET(5 MG) BY MOUTH DAILY 90 tablet 3  . metFORMIN (GLUCOPHAGE-XR) 500 MG 24 hr tablet TAKE 4 TABLETS(2000 MG) BY MOUTH DAILY WITH BREAKFAST 360 tablet 0  . metroNIDAZOLE (FLAGYL) 500 MG tablet Take 1 tablet (500 mg total) by mouth 2 (two) times daily with a meal. DO NOT CONSUME ALCOHOL WHILE TAKING THIS MEDICATION. 14 tablet 0  . Naproxen Sodium (ALEVE PO) Take 2 tablets by mouth daily.     Marland Kitchen omeprazole (PRILOSEC) 20 MG capsule Take 1 capsule (20 mg total) by mouth daily. 30 capsule 3  . omeprazole (PRILOSEC) 40 MG capsule Take 1 capsule (40 mg total) by mouth daily. 90 capsule 3  . repaglinide (PRANDIN) 2 MG tablet TAKE 1 TABLET(2 MG) BY MOUTH THREE TIMES DAILY BEFORE MEALS 270 tablet 0  . rosuvastatin (CRESTOR) 40 MG tablet Take 1 tablet (40 mg total) by mouth at bedtime. 90 tablet 1   No current facility-administered medications on file prior to visit.    Allergies  Allergen Reactions  . Codeine Nausea Only    Severe nausea    Family History  Problem Relation Age of Onset  . Dementia Mother   . Breast cancer Mother 46  . Kidney failure Mother 41       died from kidney failure from abx given to her for pneumonia  . Hyperlipidemia Mother   . Hypertension Mother    . Diabetes Mother   . Cancer Mother 53       breast  . Parkinsonism Father   . Diabetes Father 12  . Hypertension Sister   . Diabetes Brother   . Hypertension  Brother   . Heart attack Brother   . Heart disease Cousin 24       mi  . Colon cancer Neg Hx     BP 130/80   Pulse 79   Ht 5\' 6"  (1.676 m)   Wt 163 lb (73.9 kg)   SpO2 95%   BMI 26.31 kg/m   Review of Systems     Objective:   Physical Exam VITAL SIGNS:  See vs page GENERAL: no distress Pulses: dorsalis pedis intact bilat.   MSK: no deformity of the feet CV: no leg edema Skin:  no ulcer on the feet.  normal color and temp on the feet. Neuro: sensation is intact to touch on the feet   Lab Results  Component Value Date   CREATININE 0.80 01/06/2020   BUN 17 01/06/2020   NA 139 01/06/2020   K 4.1 01/06/2020   CL 99 01/06/2020   CO2 31 01/06/2020   Lab Results  Component Value Date   HGBA1C 6.7 (A) 04/22/2020       Assessment & Plan:  Type 2 DM, with CAD: well-controlled MNG: due for recheck  Patient Instructions  Please ask that a copy of the thyroid ultrasound be sent here Please continue the same 4 diabetes medications.  Blood tests are requested for you today.  We'll let you know about the results.  check your blood sugar once a day.  vary the time of day when you check, between before the 3 meals, and at bedtime.  also check if you have symptoms of your blood sugar being too high or too low.  please keep a record of the readings and bring it to your next appointment here (or you can bring the meter itself).  You can write it on any piece of paper.  please call us sooner if your blood sugar goes below 70, or if you have a lot of readings over 200.  Please come back for a follow-up appointment in 4 months.

## 2020-04-23 DIAGNOSIS — E041 Nontoxic single thyroid nodule: Secondary | ICD-10-CM | POA: Diagnosis not present

## 2020-04-27 ENCOUNTER — Encounter: Payer: Self-pay | Admitting: Family Medicine

## 2020-04-27 ENCOUNTER — Other Ambulatory Visit (HOSPITAL_BASED_OUTPATIENT_CLINIC_OR_DEPARTMENT_OTHER): Payer: Self-pay | Admitting: Internal Medicine

## 2020-04-27 ENCOUNTER — Ambulatory Visit (INDEPENDENT_AMBULATORY_CARE_PROVIDER_SITE_OTHER): Payer: Medicare Other | Admitting: Family Medicine

## 2020-04-27 ENCOUNTER — Ambulatory Visit: Payer: Medicare Other | Attending: Internal Medicine

## 2020-04-27 ENCOUNTER — Other Ambulatory Visit (HOSPITAL_COMMUNITY)
Admission: RE | Admit: 2020-04-27 | Discharge: 2020-04-27 | Disposition: A | Discharge status: Home / Self Care | Payer: Medicare Other | Source: Ambulatory Visit | Attending: Family Medicine | Admitting: Family Medicine

## 2020-04-27 ENCOUNTER — Other Ambulatory Visit: Payer: Self-pay

## 2020-04-27 VITALS — BP 108/60 | HR 82 | Temp 98.2°F | Resp 18 | Ht 66.0 in | Wt 164.2 lb

## 2020-04-27 DIAGNOSIS — A599 Trichomoniasis, unspecified: Secondary | ICD-10-CM | POA: Diagnosis not present

## 2020-04-27 DIAGNOSIS — Z124 Encounter for screening for malignant neoplasm of cervix: Secondary | ICD-10-CM

## 2020-04-27 DIAGNOSIS — Z23 Encounter for immunization: Secondary | ICD-10-CM

## 2020-04-27 NOTE — Assessment & Plan Note (Signed)
Pap done

## 2020-04-27 NOTE — Patient Instructions (Signed)
Trichomoniasis Trichomoniasis is an STI (sexually transmitted infection) that can affect both women and men. In women, the outer area of the female genitalia (vulva) and the vagina are affected. In men, mainly the penis is affected, but the prostate and other reproductive organs can also be involved.  This condition can be treated with medicine. It often has no symptoms (is asymptomatic), especially in men. If not treated, trichomoniasis can last for months or years. What are the causes? This condition is caused by a parasite called Trichomonas vaginalis. Trichomoniasis most often spreads from person to person (is contagious) through sexual contact. What increases the risk? The following factors may make you more likely to develop this condition:  Having unprotected sex.  Having sex with a partner who has trichomoniasis.  Having multiple sexual partners.  Having had previous trichomoniasis infections or other STIs. What are the signs or symptoms? In women, symptoms of trichomoniasis include:  Abnormal vaginal discharge that is clear, white, gray, or yellow-green and foamy and has an unusual "fishy" odor.  Itching and irritation of the vagina and vulva.  Burning or pain during urination or sex.  Redness and swelling of the genitals. In men, symptoms of trichomoniasis include:  Penile discharge that may be foamy or contain pus.  Pain in the penis. This may happen only when urinating.  Itching or irritation inside the penis.  Burning after urination or ejaculation. How is this diagnosed? In women, this condition may be found during a routine Pap test or physical exam. It may be found in men during a routine physical exam. Your health care provider may do tests to help diagnose this infection, such as:  Urine tests (men and women).  The following in women: ? Testing the pH of the vagina. ? A vaginal swab test that checks for the Trichomonas vaginalis parasite. ? Testing vaginal  secretions. Your health care provider may test you for other STIs, including HIV (human immunodeficiency virus). How is this treated? This condition is treated with medicine taken by mouth (orally), such as metronidazole or tinidazole, to fight the infection. Your sexual partner(s) also need to be tested and treated.  If you are a woman and you plan to become pregnant or think you may be pregnant, tell your health care provider right away. Some medicines that are used to treat the infection should not be taken during pregnancy. Your health care provider may recommend over-the-counter medicines or creams to help relieve itching or irritation. You may be tested for infection again 3 months after treatment. Follow these instructions at home:  Take and use over-the-counter and prescription medicines, including creams, only as told by your health care provider.  Take your antibiotic medicine as told by your health care provider. Do not stop taking the antibiotic even if you start to feel better.  Do not have sex until 7-10 days after you finish your medicine, or until your health care provider approves. Ask your health care provider when you may start to have sex again.  (Women) Do not douche or wear tampons while you have the infection.  Discuss your infection with your sexual partner(s). Make sure that your partner gets tested and treated, if necessary.  Keep all follow-up visits as told by your health care provider. This is important. How is this prevented?   Use condoms every time you have sex. Using condoms correctly and consistently can help protect against STIs.  Avoid having multiple sexual partners.  Talk with your sexual partner about any   symptoms that either of you may have, as well as any history of STIs.  Get tested for STIs and STDs (sexually transmitted diseases) before you have sex. Ask your partner to do the same.  Do not have sexual contact if you have symptoms of  trichomoniasis or another STI. Contact a health care provider if:  You still have symptoms after you finish your medicine.  You develop pain in your abdomen.  You have pain when you urinate.  You have bleeding after sex.  You develop a rash.  You feel nauseous or you vomit.  You plan to become pregnant or think you may be pregnant. Summary  Trichomoniasis is an STI (sexually transmitted infection) that can affect both women and men.  This condition often has no symptoms (is asymptomatic), especially in men.  Without treatment, this condition can last for months or years.  You should not have sex until 7-10 days after you finish your medicine, or until your health care provider approves. Ask your health care provider when you may start to have sex again.  Discuss your infection with your sexual partner(s). Make sure that your partner gets tested and treated, if necessary. This information is not intended to replace advice given to you by your health care provider. Make sure you discuss any questions you have with your health care provider. Document Revised: 03/26/2018 Document Reviewed: 03/26/2018 Elsevier Patient Education  2020 [employer]. Pap Test Why am I having this test? A Pap test, also called a Pap smear, is a screening test to check for signs of:  Cancer of the vagina, cervix, and uterus. The cervix is the lower part of the uterus that opens into the vagina.  Infection.  Changes that may be a sign that cancer is developing (precancerous changes). Women need this test on a regular basis. In general, you should have a Pap test every 3 years until you reach menopause or age 99. Women aged 30-60 may choose to have their Pap test done at the same time as an HPV (human papillomavirus) test every 5 years (instead of every 3 years). Your health care provider may recommend having Pap tests more or less often depending on your medical conditions and past Pap test  results. What kind of sample is taken?  Your health care provider will collect a sample of cells from the surface of your cervix. This will be done using a small cotton swab, plastic spatula, or brush. This sample is often collected during a pelvic exam, when you are lying on your back on an exam table with feet in footrests (stirrups). In some cases, fluids (secretions) from the cervix or vagina may also be collected. How do I prepare for this test?  Be aware of where you are in your menstrual cycle. If you are menstruating on the day of the test, you may be asked to reschedule.  You may need to reschedule if you have a known vaginal infection on the day of the test.  Follow instructions from your health care provider about: ? Changing or stopping your regular medicines. Some medicines can cause abnormal test results, such as digitalis and tetracycline. ? Avoiding douching or taking a bath the day before or the day of the test. Tell a health care provider about:  Any allergies you have.  All medicines you are taking, including vitamins, herbs, eye drops, creams, and over-the-counter medicines.  Any blood disorders you have.  Any surgeries you have had.  Any medical conditions  you have.  Whether you are pregnant or may be pregnant. How are the results reported? Your test results will be reported as either abnormal or normal. A false-positive result can occur. A false positive is incorrect because it means that a condition is present when it is not. A false-negative result can occur. A false negative is incorrect because it means that a condition is not present when it is. What do the results mean? A normal test result means that you do not have signs of cancer of the vagina, cervix, or uterus. An abnormal result may mean that you have:  Cancer. A Pap test by itself is not enough to diagnose cancer. You will have more tests done in this case.  Precancerous changes in your vagina,  cervix, or uterus.  Inflammation of the cervix.  An STD (sexually transmitted disease).  A fungal infection.  A parasite infection. Talk with your health care provider about what your results mean. Questions to ask your health care provider Ask your health care provider, or the department that is doing the test:  When will my results be ready?  How will I get my results?  What are my treatment options?  What other tests do I need?  What are my next steps? Summary  In general, women should have a Pap test every 3 years until they reach menopause or age 2.  Your health care provider will collect a sample of cells from the surface of your cervix. This will be done using a small cotton swab, plastic spatula, or brush.  In some cases, fluids (secretions) from the cervix or vagina may also be collected. This information is not intended to replace advice given to you by your health care provider. Make sure you discuss any questions you have with your health care provider. Document Revised: 02/19/2017 Document Reviewed: 02/19/2017 Elsevier Patient Education  [place].

## 2020-04-27 NOTE — Progress Notes (Signed)
   Covid-19 Vaccination [facility]  Name:  Alyssa Ford    MRN: [id number] DOB: 12-17-1950  04/27/2020  Alyssa Ford was observed post Covid-19 immunization for 15 minutes without incident. She was provided with Vaccine Information Sheet and instruction to access the V-Safe system.   Alyssa Ford was instructed to call 911 with any severe reactions post vaccine: Marland Kitchen Difficulty breathing  . Swelling of face and throat  . A fast heartbeat  . A bad rash all over body  . Dizziness and weakness

## 2020-04-27 NOTE — Progress Notes (Signed)
Patient ID: Alyssa Ford, female    DOB: 10-25-1950  Age: 69 y.o. MRN: [id number]    Subjective:  Subjective  HPI Alyssa Ford presents for pap smear.  She recently had trichomonas and was worried because she has been with her husband for many years and has been faithful and so has she so she does not know how she could have gotten this  No symptoms  No other complaints   Review of Systems  Constitutional: Negative for activity change, appetite change, chills, diaphoresis, fatigue, fever and unexpected weight change.  Eyes: Negative for pain, redness and visual disturbance.  Respiratory: Negative for cough, chest tightness, shortness of breath and wheezing.   Cardiovascular: Negative for chest pain, palpitations and leg swelling.  Gastrointestinal: Negative for abdominal distention and abdominal pain.  Endocrine: Negative for cold intolerance, heat intolerance, polydipsia, polyphagia and polyuria.  Genitourinary: Negative for difficulty urinating, dyspareunia, dysuria, flank pain, frequency, genital sores, hematuria, menstrual problem, pelvic pain, urgency, vaginal discharge and vaginal pain.  Musculoskeletal: Negative for back pain.  Neurological: Negative for dizziness, light-headedness, numbness and headaches.    History Past Medical History:  Diagnosis Date  . Anemia   . Anxiety   . Arthritis   . Cataract   . Chronic headaches   . Depression   . Diabetes mellitus   . Diverticulosis of colon (without mention of hemorrhage) 2003, 2008   Colonoscopy  . GERD (gastroesophageal reflux disease)   . Glaucoma   . Hiatal hernia 2003   EGD   . Hyperlipidemia   . Hypertension   . Thyroid nodule     She has a past surgical history that includes Cholecystectomy; Carpal tunnel release (2006); Carpal tunnel release (12/24/2007); Cervical fusion (3/08); Cataract extraction, bilateral; Fasciectomy (Left, 08/01/2018); and Trigger finger release (Left, 08/01/2018).   Her family history  includes Breast cancer (age of onset: 53) in her mother; Cancer (age of onset: 33) in her mother; Dementia in her mother; Diabetes in her brother and mother; Diabetes (age of onset: 20) in her father; Heart attack in her brother; Heart disease (age of onset: 94) in her cousin; Hyperlipidemia in her mother; Hypertension in her brother, mother, and sister; Kidney failure (age of onset: 34) in her mother; Parkinsonism in her father.She reports that she quit smoking about 4 years ago. Her smoking use included cigarettes. She has a 68.60 pack-year smoking history. She has never used smokeless tobacco. She reports current alcohol use. She reports that she does not use drugs.  Current Outpatient Medications on File Prior to Visit  Medication Sig Dispense Refill  . acarbose (PRECOSE) 50 MG tablet Take 1 tablet (50 mg total) by mouth 3 (three) times daily with meals. 270 tablet 3  . amitriptyline (ELAVIL) 25 MG tablet Take 2 tablets (50 mg total) by mouth daily. 180 tablet 1  . aspirin EC 81 MG tablet Take 81 mg by mouth daily.    Marland Kitchen BIOTIN PO Take 1 tablet by mouth daily.     . bromocriptine (PARLODEL) 2.5 MG tablet TAKE 1 TABLET(2.5 MG) BY MOUTH DAILY 90 tablet 3  . Coenzyme Q10 (CO Q 10 PO) Take 1 capsule by mouth daily.    . diazepam (VALIUM) 5 MG tablet TAKE 1 TABLET BY MOUTH EVERY NIGHT AT BEDTIME AS NEEDED 30 tablet 0  . Ferrous Sulfate (IRON SUPPLEMENT PO) Take 1 tablet by mouth 2 (two) times daily. 65mg     . latanoprost (XALATAN) 0.005 % ophthalmic solution Place 1 drop  into both eyes at bedtime.     Marland Kitchen lisinopril (ZESTRIL) 5 MG tablet TAKE 1 TABLET(5 MG) BY MOUTH DAILY 90 tablet 3  . metFORMIN (GLUCOPHAGE-XR) 500 MG 24 hr tablet TAKE 4 TABLETS(2000 MG) BY MOUTH DAILY WITH BREAKFAST 360 tablet 0  . Naproxen Sodium (ALEVE PO) Take 2 tablets by mouth daily.     Marland Kitchen omeprazole (PRILOSEC) 20 MG capsule Take 1 capsule (20 mg total) by mouth daily. 30 capsule 3  . omeprazole (PRILOSEC) 40 MG capsule Take 1  capsule (40 mg total) by mouth daily. 90 capsule 3  . repaglinide (PRANDIN) 2 MG tablet TAKE 1 TABLET(2 MG) BY MOUTH THREE TIMES DAILY BEFORE MEALS 270 tablet 0  . rosuvastatin (CRESTOR) 40 MG tablet Take 1 tablet (40 mg total) by mouth at bedtime. 90 tablet 1   No current facility-administered medications on file prior to visit.     Objective:  Objective  Physical Exam Vitals and nursing note reviewed. Exam conducted with a chaperone present.  Constitutional:      Appearance: She is well-developed.  HENT:     Head: Normocephalic and atraumatic.  Eyes:     Conjunctiva/sclera: Conjunctivae normal.  Neck:     Thyroid: No thyromegaly.     Vascular: No carotid bruit or JVD.  Cardiovascular:     Rate and Rhythm: Normal rate and regular rhythm.     Heart sounds: Normal heart sounds. No murmur heard.   Pulmonary:     Effort: Pulmonary effort is normal. No respiratory distress.     Breath sounds: Normal breath sounds. No wheezing or rales.  Chest:     Chest wall: No tenderness.  Abdominal:     General: There is no distension.     Palpations: Abdomen is soft.     Tenderness: There is no abdominal tenderness. There is no guarding or rebound.  Genitourinary:    Exam position: Lithotomy position.     Labia:        Right: No rash, tenderness, lesion or injury.        Left: No rash, tenderness, lesion or injury.      Vagina: No vaginal discharge, erythema or tenderness.     Cervix: No cervical motion tenderness, discharge, friability, lesion, erythema, cervical bleeding or eversion.     Uterus: Normal.      Adnexa: Right adnexa normal and left adnexa normal.     Rectum: Normal. Guaiac result negative. No tenderness or external hemorrhoid.     Musculoskeletal:     Cervical back: Normal range of motion and neck supple.  Neurological:     Mental Status: She is alert and oriented to person, place, and time.    BP 108/60 (BP Location: Right Arm, Patient Position: Sitting, Cuff Size:  Normal)   Pulse 82   Temp 98.2 F (36.8 C) (Oral)   Resp 18   Ht 5\' 6"  (1.676 m)   Wt 164 lb 3.2 oz (74.5 kg)   SpO2 98%   BMI 26.50 kg/m  Wt Readings from Last 3 Encounters:  04/27/20 164 lb 3.2 oz (74.5 kg)  04/22/20 163 lb (73.9 kg)  04/08/20 162 lb 9.6 oz (73.8 kg)     Lab Results  Component Value Date   WBC 9.0 01/06/2020   HGB 12.9 01/06/2020   HCT 38.8 01/06/2020   PLT 260.0 01/06/2020   GLUCOSE 90 01/06/2020   CHOL 125 09/22/2019   TRIG 123.0 09/22/2019   HDL 41.70 09/22/2019   LDLDIRECT  103.0 04/08/2018   LDLCALC 59 09/22/2019   ALT 18 01/06/2020   AST 17 01/06/2020   NA 139 01/06/2020   K 4.1 01/06/2020   CL 99 01/06/2020   CREATININE 0.80 01/06/2020   BUN 17 01/06/2020   CO2 31 01/06/2020   TSH 5.40 (H) 04/22/2020   HGBA1C 6.7 (A) 04/22/2020   MICROALBUR 1.1 04/17/2019    No results found.   Assessment & Plan:  Plan  I have discontinued Ericia H. Thiem's metroNIDAZOLE. I am also having her maintain her BIOTIN PO, Naproxen Sodium (ALEVE PO), latanoprost, Ferrous Sulfate (IRON SUPPLEMENT PO), aspirin EC, Coenzyme Q10 (CO Q 10 PO), rosuvastatin, omeprazole, bromocriptine, acarbose, omeprazole, diazepam, lisinopril, metFORMIN, repaglinide, and amitriptyline.  No orders of the defined types were placed in this encounter.   Problem List Items Addressed This Visit      Unprioritized   Trichimoniasis    Other Visit Diagnoses    Cervical cancer screening    -  Primary   Relevant Orders   Cytology - PAP( [employer])      Follow-up: No follow-ups on file.  Ann Held, DO

## 2020-04-27 NOTE — Assessment & Plan Note (Signed)
Pap done today to recheck Pt took the treatment  HO given to pt

## 2020-05-03 ENCOUNTER — Telehealth: Payer: Self-pay | Admitting: Acute Care

## 2020-05-03 NOTE — Telephone Encounter (Signed)
I have spoken with the patient and she is aware that her LCS CT has been scheduled on 06/04/20 @ 8:30am

## 2020-05-04 LAB — CYTOLOGY - PAP
Adequacy: ABSENT
Chlamydia: NEGATIVE
Comment: NEGATIVE
Comment: NEGATIVE
Comment: NEGATIVE
Comment: NORMAL
Diagnosis: NEGATIVE
HSV1: NEGATIVE
HSV2: NEGATIVE
Neisseria Gonorrhea: NEGATIVE
Trichomonas: NEGATIVE

## 2020-05-06 MED FILL — PFIZER-BIONTECH COVID-19 VA: 30 | 1 days supply | Qty: 0 | Fill #0

## 2020-05-10 DIAGNOSIS — E119 Type 2 diabetes mellitus without complications: Secondary | ICD-10-CM | POA: Diagnosis not present

## 2020-05-10 DIAGNOSIS — H401131 Primary open-angle glaucoma, bilateral, mild stage: Secondary | ICD-10-CM | POA: Diagnosis not present

## 2020-05-10 DIAGNOSIS — H26493 Other secondary cataract, bilateral: Secondary | ICD-10-CM | POA: Diagnosis not present

## 2020-05-13 ENCOUNTER — Ambulatory Visit: Payer: Medicare Other | Admitting: Pharmacist

## 2020-05-13 DIAGNOSIS — I10 Essential (primary) hypertension: Secondary | ICD-10-CM

## 2020-05-13 DIAGNOSIS — E118 Type 2 diabetes mellitus with unspecified complications: Secondary | ICD-10-CM

## 2020-05-13 DIAGNOSIS — K219 Gastro-esophageal reflux disease without esophagitis: Secondary | ICD-10-CM

## 2020-05-13 NOTE — Patient Instructions (Addendum)
Visit Information  Goals Addressed            This Visit's Progress   . Chronic Care Management Pharmacy Care Plan       CARE PLAN ENTRY (see longitudinal plan of care for additional care plan information)  Current Barriers:  . Chronic Disease Management support, education, and care coordination needs related to Diabetes, Hypertension, Hyperlipidemia, Nerve Pain, Anxiety, Glaucoma, GERD, Insomnia   Hypertension BP Readings from Last 3 Encounters:  04/27/20 108/60  04/22/20 130/80  04/08/20 110/68   . Pharmacist Clinical Goal(s): o Over the next 90 days, patient will work with PharmD and providers to maintain BP goal <140/90 . Current regimen:  o Lisinopril 5mg  daily AM . Patient self care activities - Over the next 90 days, patient will: o Check BP 1-2 times per week, document, and provide at future appointments o Ensure daily salt intake < 2300 mg/Semisi Biela  Hyperlipidemia Lab Results  Component Value Date/Time   LDLCALC 59 09/22/2019 10:40 AM   LDLDIRECT 103.0 04/08/2018 03:24 PM   . Pharmacist Clinical Goal(s): o Over the next 90 days, patient will work with PharmD and providers to maintain LDL goal < 100 . Current regimen:  o Rosuvastatin 40mg  daily AM o Coenzyne Q10 200mg  daily . Interventions: o Discussed taking rosuvastatin in the morning to help remember to take it . Patient self care activities - Over the next 90 days, patient will: o Maintain cholesterol medication regimen.   Diabetes Lab Results  Component Value Date/Time   HGBA1C 6.7 (A) 04/22/2020 08:09 AM   HGBA1C 7.2 (A) 01/21/2020 08:36 AM   HGBA1C 6.9 (H) 09/22/2019 10:40 AM   HGBA1C 7.4 (H) 11/23/2016 10:04 AM   . Pharmacist Clinical Goal(s): o Over the next 90 days, patient will work with PharmD and providers to maintain A1c goal <7% . Current regimen:   Acarbose 50mg  TID (increased at endo visit 01/21/20)  Metformin XR 500 #4 daily AM  Repaglinide 2mg  TID with meals  Bromocriptine 2.5mg   daily . Patient self care activities - Over the next 90 days, patient will: o Check blood sugar 3-4 times daily, document, and provide at future appointments o Contact provider with any episodes of hypoglycemia  GERD . Pharmacist Clinical Goal(s) o Over the next 90 days, patient will work with PharmD and providers to reduce symptoms of GERD . Current regimen:  o Omeprazole 40mg  daily . Interventions: o Consider reducing omeprazole to 20mg  daily (did not tolerate) . Patient self care activities - Over the next 90 days, patient will: o Maintain GERD medication regimen  Medication management . Pharmacist Clinical Goal(s): o Over the next 90 days, patient will work with PharmD and providers to maintain optimal medication adherence . Current pharmacy: [facility] . Interventions o Comprehensive medication review performed. o Continue current medication management strategy . Patient self care activities - Over the next 90 days, patient will: o Focus on medication adherence by filling medications appropriately  o Take medications as prescribed o Report any questions or concerns to PharmD and/or provider(s)  Please see past updates related to this goal by clicking on the "Past Updates" button in the selected goal         The patient verbalized understanding of instructions, educational materials, and care plan provided today.  Telephone follow up appointment with pharmacy team member scheduled for: 11/10/2020  Melvenia Beam Reza Crymes, PharmD Clinical Pharmacist [facility] [phone]

## 2020-05-13 NOTE — Chronic Care Management (AMB) (Signed)
Chronic Care Management Pharmacy  Name: Alyssa Ford  MRN: [id number] DOB: 10-20-1950  Chief Complaint/ HPI  Alyssa Ford,  69 y.o. , female presents for their Follow-Up CCM visit with the clinical pharmacist via telephone due to COVID-19 Pandemic.  PCP : Ann Held, DO  Their chronic conditions include: Diabetes, Hypertension, Hyperlipidemia, Nerve Pain, Anxiety, Glaucoma, GERD, Insomnia  Office Visits: 04/27/20: Visit w/ Dr. Etter Sjogren - Pap smear unremarkable. No med changes noted.  04/08/20: Visit w/ Dr. Etter Sjogren - Flu shot given. Cervicovaginal positive for trich. Prescribed flagyl 500mg  BID 7D  Consult Visit: 04/26/20: ENT visit w/ Dr. Ilda Foil - Thyroid ultrasound unchanged. New ultrasound in 1.5 years. 90 trial of nasal spray for congestion  04/22/20: Endo visit w/ Dr. Loanne Drilling - No med changes noted. RTC 4 months.   04/13/20: ENT visit w/ Dr. Ilda Foil - Start azelastine 1 spray each nostril BID  ED Visit 12/31/19: [facility] - Lower UTI. Prescribed nitrofurantoin monohydrate 100mg  BID x 5D  Medications: Outpatient Encounter Medications as of 05/13/2020  Medication Sig Note  . azelastine (ASTELIN) 0.1 % nasal spray Place 1 spray into both nostrils 2 (two) times daily. Per ENT   . acarbose (PRECOSE) 50 MG tablet Take 1 tablet (50 mg total) by mouth 3 (three) times daily with meals.   Marland Kitchen amitriptyline (ELAVIL) 25 MG tablet Take 2 tablets (50 mg total) by mouth daily.   Marland Kitchen aspirin EC 81 MG tablet Take 81 mg by mouth daily.   Marland Kitchen BIOTIN PO Take 1 tablet by mouth daily.    . bromocriptine (PARLODEL) 2.5 MG tablet TAKE 1 TABLET(2.5 MG) BY MOUTH DAILY   . Coenzyme Q10 (CO Q 10 PO) Take 1 capsule by mouth daily.   . diazepam (VALIUM) 5 MG tablet TAKE 1 TABLET BY MOUTH EVERY NIGHT AT BEDTIME AS NEEDED   . Ferrous Sulfate (IRON SUPPLEMENT PO) Take 1 tablet by mouth 2 (two) times daily. 65mg    . latanoprost (XALATAN) 0.005 % ophthalmic solution Place 1 drop into  both eyes at bedtime.  10/24/2013: Received from: External Pharmacy  . lisinopril (ZESTRIL) 5 MG tablet TAKE 1 TABLET(5 MG) BY MOUTH DAILY   . metFORMIN (GLUCOPHAGE-XR) 500 MG 24 hr tablet TAKE 4 TABLETS(2000 MG) BY MOUTH DAILY WITH BREAKFAST   . Naproxen Sodium (ALEVE PO) Take 2 tablets by mouth daily.    Marland Kitchen omeprazole (PRILOSEC) 20 MG capsule Take 1 capsule (20 mg total) by mouth daily.   Marland Kitchen omeprazole (PRILOSEC) 40 MG capsule Take 1 capsule (40 mg total) by mouth daily.   . repaglinide (PRANDIN) 2 MG tablet TAKE 1 TABLET(2 MG) BY MOUTH THREE TIMES DAILY BEFORE MEALS   . rosuvastatin (CRESTOR) 40 MG tablet Take 1 tablet (40 mg total) by mouth at bedtime.    No facility-administered encounter medications on file as of 05/13/2020.   SDOH Screenings   Alcohol Screen:   . Last Alcohol Screening Score (AUDIT): Not on file  Depression (PHQ2-9): Low Risk   . PHQ-2 Score: 0  Financial Resource Strain: Low Risk   . Difficulty of Paying Living Expenses: Not hard at all  Food Insecurity: No Food Insecurity  . Worried About Charity fundraiser in the Last Year: Never true  . Ran Out of Food in the Last Year: Never true  Housing: Low Risk   . Last Housing Risk Score: 0  Physical Activity: Sufficiently Active  . Days of Exercise per Week: 5 days  .  Minutes of Exercise per Session: 30 min  Social Connections:   . Frequency of Communication with Friends and Family: Not on file  . Frequency of Social Gatherings with Friends and Family: Not on file  . Attends Religious Services: Not on file  . Active Member of Clubs or Organizations: Not on file  . Attends Archivist Meetings: Not on file  . Marital Status: Not on file  Stress:   . Feeling of Stress : Not on file  Tobacco Use: Medium Risk  . Smoking Tobacco Use: Former Smoker  . Smokeless Tobacco Use: Never Used  Transportation Needs: No Transportation Needs  . Lack of Transportation (Medical): No  . Lack of Transportation  (Non-Medical): No    Current Diagnosis/Assessment:  Goals Addressed            This Visit's Progress   . Chronic Care Management Pharmacy Care Plan       CARE PLAN ENTRY (see longitudinal plan of care for additional care plan information)  Current Barriers:  . Chronic Disease Management support, education, and care coordination needs related to Diabetes, Hypertension, Hyperlipidemia, Nerve Pain, Anxiety, Glaucoma, GERD, Insomnia   Hypertension BP Readings from Last 3 Encounters:  04/27/20 108/60  04/22/20 130/80  04/08/20 110/68   . Pharmacist Clinical Goal(s): o Over the next 90 days, patient will work with PharmD and providers to maintain BP goal <140/90 . Current regimen:  o Lisinopril 5mg  daily AM . Patient self care activities - Over the next 90 days, patient will: o Check BP 1-2 times per week, document, and provide at future appointments o Ensure daily salt intake < 2300 mg/Alyssa Ford  Hyperlipidemia Lab Results  Component Value Date/Time   LDLCALC 59 09/22/2019 10:40 AM   LDLDIRECT 103.0 04/08/2018 03:24 PM   . Pharmacist Clinical Goal(s): o Over the next 90 days, patient will work with PharmD and providers to maintain LDL goal < 100 . Current regimen:  o Rosuvastatin 40mg  daily AM o Coenzyne Q10 200mg  daily . Interventions: o Discussed taking rosuvastatin in the morning to help remember to take it . Patient self care activities - Over the next 90 days, patient will: o Maintain cholesterol medication regimen.   Diabetes Lab Results  Component Value Date/Time   HGBA1C 6.7 (A) 04/22/2020 08:09 AM   HGBA1C 7.2 (A) 01/21/2020 08:36 AM   HGBA1C 6.9 (H) 09/22/2019 10:40 AM   HGBA1C 7.4 (H) 11/23/2016 10:04 AM   . Pharmacist Clinical Goal(s): o Over the next 90 days, patient will work with PharmD and providers to maintain A1c goal <7% . Current regimen:   Acarbose 50mg  TID (increased at endo visit 01/21/20)  Metformin XR 500 #4 daily AM  Repaglinide 2mg  TID  with meals  Bromocriptine 2.5mg  daily . Patient self care activities - Over the next 90 days, patient will: o Check blood sugar 3-4 times daily, document, and provide at future appointments o Contact provider with any episodes of hypoglycemia  GERD . Pharmacist Clinical Goal(s) o Over the next 90 days, patient will work with PharmD and providers to reduce symptoms of GERD . Current regimen:  o Omeprazole 40mg  daily . Interventions: o Consider reducing omeprazole to 20mg  daily (did not tolerate) . Patient self care activities - Over the next 90 days, patient will: o Maintain GERD medication regimen  Medication management . Pharmacist Clinical Goal(s): o Over the next 90 days, patient will work with PharmD and providers to maintain optimal medication adherence . Current pharmacy: [facility] .  Interventions o Comprehensive medication review performed. o Continue current medication management strategy . Patient self care activities - Over the next 90 days, patient will: o Focus on medication adherence by filling medications appropriately  o Take medications as prescribed o Report any questions or concerns to PharmD and/or provider(s)  Please see past updates related to this goal by clicking on the "Past Updates" button in the selected goal        Social Hx:  Originally from [place]. Married 49 years. 3 children. 2 grandchildren. 1 great grandchild 6 weeks.   Uses pill box  Hypertension   BP goal is:  <140/90  Office blood pressures are  BP Readings from Last 3 Encounters:  04/27/20 108/60  04/22/20 130/80  04/08/20 110/68   Patient checks BP at home 1-2x per week Patient home BP readings are ranging: 120s/80s  Patient has failed these meds in the past: hctz (BP running low) Patient is currently controlled on the following medications:  . Lisinopril 5mg  daily  Walks 1 mile 5-7 times per week (~25 mins).   Plan -Continue current medications   Diabetes   A1c goal  <7% FBG: 80-130 PPBG: <180  Recent Relevant Labs: Lab Results  Component Value Date/Time   HGBA1C 6.7 (A) 04/22/2020 08:09 AM   HGBA1C 7.2 (A) 01/21/2020 08:36 AM   HGBA1C 6.9 (H) 09/22/2019 10:40 AM   HGBA1C 7.4 (H) 11/23/2016 10:04 AM   MICROALBUR 1.1 04/17/2019 08:48 AM   MICROALBUR 1.1 04/08/2018 03:24 PM    Checking BG: Daily (about 5 times per week)  Recent FBG Readings: Usually run in 120s per patient Highest: 210 an hour after eating Lowest: 90-100 No symptoms of hypoglycemia  Patient has failed these meds in past: glimepiride (efficacy?), januvia (cost) Patient is currently controlled on the following medications:   Acarbose 50mg  TID (increased at endo visit 01/21/20)  Metformin XR 500 #4 daily AM  Repaglinide 2mg  TID with meals  Bromocriptine 2.5mg  daily (started by Endo about 3 years ago)  Paying $150 for 3 month supply of bromocriptine  Last diabetic Eye exam:  Lab Results  Component Value Date/Time   HMDIABEYEEXA Retinopathy (A) 06/17/2018 12:00 AM  Last seen November 2020 per patient (Dr. Gala Romney)   Last diabetic Foot exam:  Lab Results  Component Value Date/Time   HMDIABFOOTEX normal 06/06/2010 12:00 AM   Dr. Loanne Drilling performed during 09/09/19 visit per patient  Followed by Endo (Dr. Hale Bogus).   Used medicare.gov after visit to determine drug cost for first line DM therapies and it appears cost could be a barrier.  Patient would quickly approach the coverage gap (~2 months) if she were to be prescribed Jardiance or Ozempic. This would present a barrier to adherence.   We discussed: possibility of other medications to consider for replacement of her current regimen (espcially noting once daily and once weekly options), but it appears cost may be a barrier.    Update 12/11/19 Is walking 1.5 miles most days.  Denies any s/sx of hypoglycemia. Can feel hypoglycemia less than 90.   Update 02/10/20 Discussed increase of acarbose per Dr. Loanne Drilling. Pt  states she did increase dose. Noted increase in a1c and patient states she was on vacation.  Checks BG 1-2 times per Cadynce Garrette.  Reports BG 110s-120s Feels like she had a low this week.  States she needs a new meter.   Update 05/12/20 A1c improved! Congratulated patient on this.  Reports BG running same as previous (110-120s)  Plan -Continue  current medications   Future Plan -See if patient would be eligible for patient assistance that could allow her to utilize 1st line options for DM (SGLT2 Inhibitor or GLP1 Agonist)   GERD    Patient has failed these meds in past: None noted  Patient is currently controlled on the following medications:   Omeprazole 40mg  daily  Breakthrough Sx: Only when she forgets to take her medicine Triggers: Sausage, bacon, greasy foods, spicy food  She is interested in seeing if she can get symptom control with 20mg  daily  Update 12/11/19 Got a 90 supply of 40mg  so has not worked on tapering plan. Will wait until patient runs out of current supply before starting tapering plan  We discussed:  Risk/benefit of long term PPI use   Update 02/10/20 She would like to see if she can tolerate 20mg  dose of omeprazole. She has about a 1 week supply of omeprazole 40mg  remaining.   Update 05/12/20 Moved back to 40mg , she didn't feel the 20mg  was working for her  Plan -Continue current medications   Miscellaneous Meds Biotin 10,052mcg (for scarring alopecia) Ferrous Sulfate 65mg  BID (hx of anemia per pt) Vitamin B12 3078mcg SL (was previously getting injections) Triamcinolone Prevagen   De Blanch, PharmD Clinical Pharmacist [facility] [phone]

## 2020-06-01 ENCOUNTER — Telehealth: Payer: Self-pay

## 2020-06-01 NOTE — Telephone Encounter (Signed)
Pt called requesting a refill on rosuvastatin (CRESTOR) 40 MG tablet.  [facility] is [facility] [address].

## 2020-06-01 NOTE — Telephone Encounter (Signed)
Pt last seen on 11/02. Crestor has not been refilled since 03/2019. Please advise

## 2020-06-02 ENCOUNTER — Other Ambulatory Visit: Payer: Self-pay | Admitting: Family Medicine

## 2020-06-02 ENCOUNTER — Other Ambulatory Visit: Payer: Self-pay

## 2020-06-02 DIAGNOSIS — E1169 Type 2 diabetes mellitus with other specified complication: Secondary | ICD-10-CM

## 2020-06-02 DIAGNOSIS — I10 Essential (primary) hypertension: Secondary | ICD-10-CM

## 2020-06-02 DIAGNOSIS — E785 Hyperlipidemia, unspecified: Secondary | ICD-10-CM

## 2020-06-02 MED ORDER — ROSUVASTATIN CALCIUM 40 MG PO TABS: 40.0000 mg | ORAL_TABLET | Freq: Every day | ORAL | 1 refills | Status: DC

## 2020-06-02 NOTE — Telephone Encounter (Signed)
I refilled but pt overdue for labs Order was placed a while ago

## 2020-06-02 NOTE — Telephone Encounter (Signed)
Pt called. Lab appt made for next week

## 2020-06-04 ENCOUNTER — Other Ambulatory Visit: Payer: Self-pay

## 2020-06-04 ENCOUNTER — Ambulatory Visit (INDEPENDENT_AMBULATORY_CARE_PROVIDER_SITE_OTHER)
Admission: RE | Admit: 2020-06-04 | Discharge: 2020-06-04 | Disposition: A | Discharge status: Home / Self Care | Payer: Medicare Other | Source: Ambulatory Visit | Attending: Family Medicine | Admitting: Family Medicine

## 2020-06-04 DIAGNOSIS — Z87891 Personal history of nicotine dependence: Secondary | ICD-10-CM

## 2020-06-07 ENCOUNTER — Other Ambulatory Visit (INDEPENDENT_AMBULATORY_CARE_PROVIDER_SITE_OTHER): Payer: Medicare Other

## 2020-06-07 ENCOUNTER — Other Ambulatory Visit: Payer: Self-pay

## 2020-06-07 DIAGNOSIS — E1169 Type 2 diabetes mellitus with other specified complication: Secondary | ICD-10-CM

## 2020-06-07 DIAGNOSIS — E785 Hyperlipidemia, unspecified: Secondary | ICD-10-CM | POA: Diagnosis not present

## 2020-06-07 DIAGNOSIS — I10 Essential (primary) hypertension: Secondary | ICD-10-CM

## 2020-06-07 LAB — COMPREHENSIVE METABOLIC PANEL
ALT: 19 U/L (ref 0–35)
AST: 14 U/L (ref 0–37)
Albumin: 4.6 g/dL (ref 3.5–5.2)
Alkaline Phosphatase: 106 U/L (ref 39–117)
BUN: 18 mg/dL (ref 6–23)
CO2: 30 mEq/L (ref 19–32)
Calcium: 10.1 mg/dL (ref 8.4–10.5)
Chloride: 99 mEq/L (ref 96–112)
Creatinine, Ser: 0.91 mg/dL (ref 0.40–1.20)
GFR: 64.05 mL/min (ref >60.00)
Glucose, Bld: 178 mg/dL — ABNORMAL HIGH (ref 70–99)
Potassium: 4.3 mEq/L (ref 3.5–5.1)
Sodium: 138 mEq/L (ref 135–145)
Total Bilirubin: 0.4 mg/dL (ref 0.2–1.2)
Total Protein: 7.4 g/dL (ref 6.0–8.3)

## 2020-06-07 LAB — LIPID PANEL
Cholesterol: 163 mg/dL (ref 0–200)
HDL: 41.6 mg/dL (ref >39.00)
NonHDL: 120.96
Total CHOL/HDL Ratio: 4
Triglycerides: 259 mg/dL — ABNORMAL HIGH (ref 0.0–149.0)
VLDL: 51.8 mg/dL — ABNORMAL HIGH (ref 0.0–40.0)

## 2020-06-07 LAB — LDL CHOLESTEROL, DIRECT: Direct LDL: 87 mg/dL

## 2020-06-10 ENCOUNTER — Telehealth (INDEPENDENT_AMBULATORY_CARE_PROVIDER_SITE_OTHER): Payer: Medicare Other | Admitting: Family Medicine

## 2020-06-10 ENCOUNTER — Other Ambulatory Visit: Payer: Self-pay | Admitting: Family Medicine

## 2020-06-10 DIAGNOSIS — R059 Cough, unspecified: Secondary | ICD-10-CM | POA: Diagnosis not present

## 2020-06-10 DIAGNOSIS — E1169 Type 2 diabetes mellitus with other specified complication: Secondary | ICD-10-CM

## 2020-06-10 DIAGNOSIS — E785 Hyperlipidemia, unspecified: Secondary | ICD-10-CM

## 2020-06-10 DIAGNOSIS — R0981 Nasal congestion: Secondary | ICD-10-CM | POA: Diagnosis not present

## 2020-06-10 MED ORDER — BENZONATATE 100 MG PO CAPS
100.0000 mg | ORAL_CAPSULE | Freq: Three times a day (TID) | ORAL | 0 refills | Status: DC | PRN
Start: 2020-06-10 — End: 2020-06-29

## 2020-06-10 NOTE — Patient Instructions (Addendum)
-I sent the medication(s) we discussed to your pharmacy: Meds ordered this encounter  Medications  . benzonatate (TESSALON PERLES) 100 MG capsule    Sig: Take 1 capsule (100 mg total) by mouth 3 (three) times daily as needed.    Dispense:  20 capsule    Refill:  0     I hope you are feeling better soon!  Seek in person care promptly if your symptoms worsen, new concerns arise or you are not improving with treatment.  It was nice to meet you today. I help [facility] out with telemedicine visits on Tuesdays and Thursdays and am available for visits on those days. If you have any concerns or questions following this visit please schedule a follow up visit with your Primary Care doctor or seek care at a local urgent care clinic to avoid delays in care.    HOME CARE TIPS FOR UPPER RESPIRATORY INFECTION:  -plenty of rest and fluids  -nasal saline wash 2-3 times daily (use prepackaged nasal saline or bottled/distilled water if making your own)   -can use tylenol (in no history of liver disease) as directed for aches and sorethroat  -in the winter time, using a humidifier at night is helpful (please follow cleaning instructions)  -if you are taking a cough medication - use only as directed, may also try a teaspoon of honey to coat the throat and throat lozenges. If given a cough medication with codeine or hydrocodone or other narcotic please be advised that this contains a strong and  potentially addicting medication. Please follow instructions carefully, take as little as possible and only use AS NEEDED for severe cough. Discuss potential side effects with your pharmacy. Please do not drive or operate machinery while taking these types of medications. Please do not take other sedating medications, drugs or alcohol while taking this medication without discussing with your doctor.  -for sore throat, salt water gargles can help  -follow up if you have fevers, facial pain, tooth pain, difficulty  breathing or are worsening or symptoms persist longer then expected  Upper Respiratory Infection, Adult An upper respiratory infection (URI) is also known as the common cold. It is often caused by a type of germ (virus). Colds are easily spread (contagious). You can pass it to others by kissing, coughing, sneezing, or drinking out of the same glass. Usually, you get better in 1 to 3  weeks.  However, the cough can last for even longer. HOME CARE   Only take medicine as told by your doctor. Follow instructions provided above.  Drink enough water and fluids to keep your pee (urine) clear or pale yellow.  Get plenty of rest.  Return to work when your temperature is < 100 for 24 hours or as told by your doctor. You may use a face mask and wash your hands to stop your cold from spreading. GET HELP RIGHT AWAY IF:   After the first few days, you feel you are getting worse.  You have questions about your medicine.  You have chills, shortness of breath, or red spit (mucus).  You have pain in the face for more then 1-2 days, especially when you bend forward.  You have a fever, puffy (swollen) neck, pain when you swallow, or white spots in the back of your throat.  You have a bad headache, ear pain, sinus pain, or chest pain.  You have a high-pitched whistling sound when you breathe in and out (wheezing).  You cough up blood.  You have sore muscles or a stiff neck. MAKE SURE YOU:   Understand these instructions.  Will watch your condition.  Will get help right away if you are not doing well or get worse. Document Released: 11/29/2007 Document Revised: 09/04/2011 Document Reviewed: 09/17/2013 [facility] Patient Information 2015 [place], [place]. This information is not intended to replace advice given to you by your health care provider. Make sure you discuss any questions you have with your health care provider.

## 2020-06-10 NOTE — Progress Notes (Signed)
Virtual Visit via Video Note  I connected with Alyssa Ford  on 06/10/20 at  4:20 PM EST by a video enabled telemedicine application and verified that I am speaking with the correct person using two identifiers.  Location patient: home, [place] Location provider:work or home office Persons participating in the virtual visit: patient, provider  I discussed the limitations of evaluation and management by telemedicine and the availability of in person appointments. The patient expressed understanding and agreed to proceed.   HPI:  Acute telemedicine visit for a cough: -Onset: about 5 days ago -Symptoms include: nasal congestion, sore throat, cough -grandkid was at her house last week and had a cold -pt did covid test Monday and today - both negative -Denies: fever, NVD, CP, SOB -Has tried: zycam, musinex, theraflu -Pertinent past medical history: see below -Pertinent medication allergies: -COVID-19 vaccine status: fully vaccinated + booster and had flu shot ROS: See pertinent positives and negatives per HPI.  Past Medical History:  Diagnosis Date  . Anemia   . Anxiety   . Arthritis   . Cataract   . Chronic headaches   . Depression   . Diabetes mellitus   . Diverticulosis of colon (without mention of hemorrhage) 2003, 2008   Colonoscopy  . GERD (gastroesophageal reflux disease)   . Glaucoma   . Hiatal hernia 2003   EGD   . Hyperlipidemia   . Hypertension   . Thyroid nodule     Past Surgical History:  Procedure Laterality Date  . CARPAL TUNNEL RELEASE  2006  . CARPAL TUNNEL RELEASE  12/24/2007  . CATARACT EXTRACTION, BILATERAL     [facility]---  sept and oct 2018  . CERVICAL FUSION  3/08  . CHOLECYSTECTOMY    . FASCIECTOMY Left 08/01/2018   Procedure: FASCIECTOMY LEFT RING;  Surgeon: Daryll Brod, MD;  Location: [place];  Service: Orthopedics;  Laterality: Left;  UPPER ARM  . TRIGGER FINGER RELEASE Left 08/01/2018   Procedure: RELEASE TRIGGER FINGER/A-1 PULLEY LEFTRING;   Surgeon: Daryll Brod, MD;  Location: [place];  Service: Orthopedics;  Laterality: Left;     Current Outpatient Medications:  .  acarbose (PRECOSE) 50 MG tablet, Take 1 tablet (50 mg total) by mouth 3 (three) times daily with meals., Disp: 270 tablet, Rfl: 3 .  amitriptyline (ELAVIL) 25 MG tablet, Take 2 tablets (50 mg total) by mouth daily., Disp: 180 tablet, Rfl: 1 .  aspirin EC 81 MG tablet, Take 81 mg by mouth daily., Disp: , Rfl:  .  azelastine (ASTELIN) 0.1 % nasal spray, Place 1 spray into both nostrils 2 (two) times daily. Per ENT, Disp: , Rfl:  .  benzonatate (TESSALON PERLES) 100 MG capsule, Take 1 capsule (100 mg total) by mouth 3 (three) times daily as needed., Disp: 20 capsule, Rfl: 0 .  BIOTIN PO, Take 1 tablet by mouth daily. , Disp: , Rfl:  .  bromocriptine (PARLODEL) 2.5 MG tablet, TAKE 1 TABLET(2.5 MG) BY MOUTH DAILY, Disp: 90 tablet, Rfl: 3 .  Coenzyme Q10 (CO Q 10 PO), Take 1 capsule by mouth daily., Disp: , Rfl:  .  diazepam (VALIUM) 5 MG tablet, TAKE 1 TABLET BY MOUTH EVERY NIGHT AT BEDTIME AS NEEDED, Disp: 30 tablet, Rfl: 0 .  Ferrous Sulfate (IRON SUPPLEMENT PO), Take 1 tablet by mouth 2 (two) times daily. 65mg , Disp: , Rfl:  .  latanoprost (XALATAN) 0.005 % ophthalmic solution, Place 1 drop into both eyes at bedtime. , Disp: , Rfl:  .  lisinopril (ZESTRIL) 5 MG tablet, TAKE 1 TABLET(5 MG) BY MOUTH DAILY, Disp: 90 tablet, Rfl: 3 .  metFORMIN (GLUCOPHAGE-XR) 500 MG 24 hr tablet, TAKE 4 TABLETS(2000 MG) BY MOUTH DAILY WITH BREAKFAST, Disp: 360 tablet, Rfl: 0 .  Naproxen Sodium (ALEVE PO), Take 2 tablets by mouth daily. , Disp: , Rfl:  .  omeprazole (PRILOSEC) 20 MG capsule, Take 1 capsule (20 mg total) by mouth daily., Disp: 30 capsule, Rfl: 3 .  omeprazole (PRILOSEC) 40 MG capsule, Take 1 capsule (40 mg total) by mouth daily., Disp: 90 capsule, Rfl: 3 .  repaglinide (PRANDIN) 2 MG tablet, TAKE 1 TABLET(2 MG) BY MOUTH THREE TIMES DAILY BEFORE MEALS, Disp:  270 tablet, Rfl: 0 .  rosuvastatin (CRESTOR) 40 MG tablet, Take 1 tablet (40 mg total) by mouth at bedtime., Disp: 90 tablet, Rfl: 1  EXAM:  VITALS per patient if applicable:  GENERAL: alert, oriented, appears well and in no acute distress  HEENT: atraumatic, conjunttiva clear, no obvious abnormalities on inspection of external nose and ears  NECK: normal movements of the head and neck  LUNGS: on inspection no signs of respiratory distress, breathing rate appears normal, no obvious gross SOB, gasping or wheezing  CV: no obvious cyanosis  MS: moves all visible extremities without noticeable abnormality  PSYCH/NEURO: pleasant and cooperative, no obvious depression or anxiety, speech and thought processing grossly intact  ASSESSMENT AND PLAN:  Discussed the following assessment and plan:  Nasal congestion  Cough  -we discussed possible serious and likely etiologies, options for evaluation and workup, limitations of telemedicine visit vs in person visit, treatment, treatment risks and precautions. Pt prefers to treat via telemedicine empirically rather than in person at this moment.  Query VURI vs other. She opted for treatment with Tessalon for cough, nasal saline, tylenol if needed, humidifier, warm liquids. Is fully vaccinated and had several negative covid tests.  Advised to seek prompt in person care if worsening, new symptoms arise, or if is not improving with treatment. Discussed options for inperson care if PCP office not available. Did let this patient know that I only do telemedicine on Tuesdays and Thursdays for [place]. Advised to schedule follow up visit with PCP or [facility] if any further questions or concerns to avoid delays in care.   I discussed the assessment and treatment plan with the patient. The patient was provided an opportunity to ask questions and all were answered. The patient agreed with the plan and demonstrated an understanding of the instructions.     Lucretia Kern, DO

## 2020-06-11 ENCOUNTER — Other Ambulatory Visit: Payer: Self-pay | Admitting: *Deleted

## 2020-06-11 ENCOUNTER — Telehealth: Payer: Medicare Other | Admitting: Family Medicine

## 2020-06-11 DIAGNOSIS — Z87891 Personal history of nicotine dependence: Secondary | ICD-10-CM

## 2020-06-11 MED ORDER — FENOFIBRATE 160 MG PO TABS: 160.0000 mg | ORAL_TABLET | Freq: Every day | ORAL | 2 refills | Status: DC

## 2020-06-11 NOTE — Addendum Note (Signed)
Addended byDamita Dunnings D on: 06/11/2020 08:50 AM   Modules accepted: Orders

## 2020-06-11 NOTE — Progress Notes (Signed)
Please call patient and let them  know their  low dose Ct was read as a  Lung RADS 2: nodules that are benign in appearance and behavior with a very low likelihood of becoming a clinically active cancer due to size or lack of growth. Recommendation per radiology is for a repeat LDCT in 12 months. Please let them  know we will order and schedule their  annual screening scan for 05/2021. Please let them  know there was notation of CAD on their  scan.  Please remind the patient  that this is a non-gated exam therefore degree or severity of disease  cannot be determined. Please have them  follow up with their PCP regarding potential risk factor modification, dietary therapy or pharmacologic therapy if clinically indicated. Pt.  is  not currently on statin therapy. Please place order for annual  screening scan for  05/2021 and fax results to PCP. Thanks so much. Remind her she should continue to have her Thyroid monitored. She was supposed to have a repeat US of her Thyroid 06/2019, but I do not see it in the Epic system. Have her discuss this with her PCP. Thanks so much

## 2020-06-15 DIAGNOSIS — B9689 Other specified bacterial agents as the cause of diseases classified elsewhere: Secondary | ICD-10-CM | POA: Diagnosis not present

## 2020-06-15 DIAGNOSIS — R220 Localized swelling, mass and lump, head: Secondary | ICD-10-CM | POA: Diagnosis not present

## 2020-06-15 DIAGNOSIS — J069 Acute upper respiratory infection, unspecified: Secondary | ICD-10-CM | POA: Diagnosis not present

## 2020-06-23 ENCOUNTER — Other Ambulatory Visit: Payer: Self-pay | Admitting: Endocrinology

## 2020-06-23 DIAGNOSIS — E1165 Type 2 diabetes mellitus with hyperglycemia: Secondary | ICD-10-CM

## 2020-06-23 DIAGNOSIS — E1151 Type 2 diabetes mellitus with diabetic peripheral angiopathy without gangrene: Secondary | ICD-10-CM

## 2020-06-23 DIAGNOSIS — IMO0002 Reserved for concepts with insufficient information to code with codable children: Secondary | ICD-10-CM

## 2020-06-29 ENCOUNTER — Telehealth (INDEPENDENT_AMBULATORY_CARE_PROVIDER_SITE_OTHER): Payer: Medicare Other | Admitting: Family Medicine

## 2020-06-29 ENCOUNTER — Other Ambulatory Visit: Payer: Self-pay

## 2020-06-29 ENCOUNTER — Encounter: Payer: Self-pay | Admitting: Family Medicine

## 2020-06-29 VITALS — BP 139/68 | Temp 98.0°F

## 2020-06-29 DIAGNOSIS — J0140 Acute pansinusitis, unspecified: Secondary | ICD-10-CM

## 2020-06-29 MED ORDER — DOXYCYCLINE HYCLATE 100 MG PO TABS: 100.0000 mg | ORAL_TABLET | Freq: Two times a day (BID) | ORAL | 0 refills | Status: DC

## 2020-06-29 MED ORDER — PREDNISONE 10 MG PO TABS: ORAL_TABLET | ORAL | 0 refills | Status: DC

## 2020-06-29 MED ORDER — FLUTICASONE PROPIONATE 50 MCG/ACT NA SUSP: 2.0000 | Freq: Every day | NASAL | 6 refills | Status: DC

## 2020-06-29 NOTE — Progress Notes (Signed)
Virtual Visit via Video Note  I connected with Alyssa Ford on 06/29/20 at  4:00 PM EST by a video enabled telemedicine application and verified that I am speaking with the correct person using two identifiers.  Location: Patient: home alone Provider: home    I discussed the limitations of evaluation and management by telemedicine and the availability of in person appointments. The patient expressed understanding and agreed to proceed.  History of Present Illness:   pt is home c/o sinus congestion and just finished abx and prednisone --- she finished last Tuesday She has tested several x for covid and they were neg   Observations/Objective: Vitals:   06/29/20 1554  BP: 139/68  Temp: 98 F (36.7 C)  pt c/o frontal and max sinus pressure  No sob  Pt does not look toxic Psych--no depression/ anxiety   Assessment and Plan: 1. Acute non-recurrent pansinusitis con't astelin  Add flonase Doxy and pred taper covid neg and fully vaccinated---- ok to be seen in office if no improvement  - doxycycline (VIBRA-TABS) 100 MG tablet; Take 1 tablet (100 mg total) by mouth 2 (two) times daily.  Dispense: 20 tablet; Refill: 0 - predniSONE (DELTASONE) 10 MG tablet; TAKE 3 TABLETS PO QD FOR 3 DAYS THEN TAKE 2 TABLETS PO QD FOR 3 DAYS THEN TAKE 1 TABLET PO QD FOR 3 DAYS THEN TAKE 1/2 TAB PO QD FOR 3 DAYS  Dispense: 20 tablet; Refill: 0 - fluticasone (FLONASE) 50 MCG/ACT nasal spray; Place 2 sprays into both nostrils daily.  Dispense: 16 g; Refill: 6   Follow Up Instructions:    I discussed the assessment and treatment plan with the patient. The patient was provided an opportunity to ask questions and all were answered. The patient agreed with the plan and demonstrated an understanding of the instructions.   The patient was advised to call back or seek an in-person evaluation if the symptoms worsen or if the condition fails to improve as anticipated.  I provided 25 minutes of  non-face-to-face time during this encounter.   Donato Schultz, DO

## 2020-07-02 ENCOUNTER — Other Ambulatory Visit: Payer: Self-pay | Admitting: Family Medicine

## 2020-07-02 DIAGNOSIS — Z1231 Encounter for screening mammogram for malignant neoplasm of breast: Secondary | ICD-10-CM

## 2020-07-13 DIAGNOSIS — Z9109 Other allergy status, other than to drugs and biological substances: Secondary | ICD-10-CM | POA: Diagnosis not present

## 2020-07-13 DIAGNOSIS — J300 Vasomotor rhinitis: Secondary | ICD-10-CM | POA: Diagnosis not present

## 2020-07-30 ENCOUNTER — Telehealth: Payer: Self-pay | Admitting: Pharmacist

## 2020-07-30 NOTE — Progress Notes (Addendum)
Chronic Care Management Pharmacy Assistant   Name: Alyssa Ford  MRN: [id number] DOB: [birth year]  Reason for Encounter: Disease State For DM.  Patient Questions:  1.  Have you seen any other providers since your last visit? Yes.   2.  Any changes in your medicines or health? Yes.    PCP : Alyssa Held, DO   Their chronic conditions include: Diabetes, Hypertension, Hyperlipidemia, Nerve Pain, Anxiety, Glaucoma, GERD, Insomnia.  Office Visits: 06/29/20 (Video Visits) Alyssa Ford, Alyssa Apa, DO. STARTED Doxycycline Hyclate 100 mg 2 times daily, Flonase, and Prednisone 10 mg pack. STOPPED/COMPLETED Amoxicillin and Benzonatate.  06/10/20 (Video Visits) Alyssa Kern, DO. For Nasal Congestion/Cough. STARTED Benzonatate 100 mg 3 times daily PRN.  Consults: Since 05/13/20  Urgent Care: 06/15/20 Alyssa Collin, NP. Nasal Congestion. STARTED Amoxicillin-Calvulante 875-125 mg Take 1 tablet by mouth 2 (two) times a day for 7 days and Prednisone 20 mg Take 2 tablets (40 mg total) by mouth 1 (one) time each day for 5 days.  Allergies:   Allergies  Allergen Reactions   Codeine Nausea Only    Severe nausea    Medications: Outpatient Encounter Medications as of 07/30/2020  Medication Sig Note   acarbose (PRECOSE) 50 MG tablet Take 1 tablet (50 mg total) by mouth 3 (three) times daily with meals.    amitriptyline (ELAVIL) 25 MG tablet Take 2 tablets (50 mg total) by mouth daily.    aspirin EC 81 MG tablet Take 81 mg by mouth daily.    azelastine (ASTELIN) 0.1 % nasal spray Place 1 spray into both nostrils 2 (two) times daily. Per ENT    BIOTIN PO Take 1 tablet by mouth daily.     bromocriptine (PARLODEL) 2.5 MG tablet TAKE 1 TABLET(2.5 MG) BY MOUTH DAILY    Coenzyme Q10 (CO Q 10 PO) Take 1 capsule by mouth daily.    diazepam (VALIUM) 5 MG tablet TAKE 1 TABLET BY MOUTH EVERY NIGHT AT BEDTIME AS NEEDED    doxycycline (VIBRA-TABS) 100 MG tablet Take 1 tablet (100 mg total) by  mouth 2 (two) times daily.    fenofibrate 160 MG tablet Take 1 tablet (160 mg total) by mouth daily.    Ferrous Sulfate (IRON SUPPLEMENT PO) Take 1 tablet by mouth 2 (two) times daily. 65m    fluticasone (FLONASE) 50 MCG/ACT nasal spray Place 2 sprays into both nostrils daily.    latanoprost (XALATAN) 0.005 % ophthalmic solution Place 1 drop into both eyes at bedtime.  10/24/2013: Received from: External Pharmacy   lisinopril (ZESTRIL) 5 MG tablet TAKE 1 TABLET(5 MG) BY MOUTH DAILY    metFORMIN (GLUCOPHAGE-XR) 500 MG 24 hr tablet TAKE 4 TABLETS(2000 MG) BY MOUTH DAILY WITH BREAKFAST    Naproxen Sodium (ALEVE PO) Take 2 tablets by mouth daily.     omeprazole (PRILOSEC) 40 MG capsule Take 1 capsule (40 mg total) by mouth daily.    predniSONE (DELTASONE) 10 MG tablet TAKE 3 TABLETS PO QD FOR 3 DAYS THEN TAKE 2 TABLETS PO QD FOR 3 DAYS THEN TAKE 1 TABLET PO QD FOR 3 DAYS THEN TAKE 1/2 TAB PO QD FOR 3 DAYS    predniSONE (DELTASONE) 20 MG tablet Take 20 mg by mouth 2 (two) times daily.    repaglinide (PRANDIN) 2 MG tablet TAKE 1 TABLET(2 MG) BY MOUTH THREE TIMES DAILY BEFORE MEALS    rosuvastatin (CRESTOR) 40 MG tablet Take 1 tablet (40 mg total) by mouth at bedtime.  No facility-administered encounter medications on file as of 07/30/2020.    Current Diagnosis: Patient Active Problem List   Diagnosis Date Noted   Trichimoniasis 04/27/2020   Cervical cancer screening 04/27/2020   Left thyroid nodule 04/13/2020   Vaginal odor 01/11/2020   Icterus 01/11/2020   Uncontrolled type 2 diabetes mellitus with hyperglycemia ([place]) 09/22/2019   De Quervain's disease (tenosynovitis) 09/22/2019   Neck mass 07/11/2018   Abnormal WBC count 05/29/2017   Skin fissure 09/24/2015   Allergic rhinitis 03/26/2014   Wheezing 03/26/2014   DM (diabetes mellitus) type II uncontrolled, periph vascular disorder ([place]) 08/25/2011   Headache(784.0) 08/21/2011   Acute sinusitis 04/26/2010   URI 04/26/2010   UNSPECIFIED  CATARACT 02/23/2010   POSTMENOPAUSAL STATUS 12/12/2007   FATIGUE 09/11/2007   THYROID NODULE 11/22/2006   Diabetes ([place]) 11/22/2006   Hyperlipidemia LDL goal <70 11/22/2006   GLUCOMA 11/22/2006   Essential hypertension 11/22/2006   GERD 11/22/2006    Goals Addressed   None    Recent Relevant Labs: Lab Results  Component Value Date/Time   HGBA1C 6.7 (A) 04/22/2020 08:09 AM   HGBA1C 7.2 (A) 01/21/2020 08:36 AM   HGBA1C 6.9 (H) 09/22/2019 10:40 AM   HGBA1C 7.4 (H) 11/23/2016 10:04 AM   MICROALBUR 1.1 04/17/2019 08:48 AM   MICROALBUR 1.1 04/08/2018 03:24 PM    Kidney Function Lab Results  Component Value Date/Time   CREATININE 0.91 06/07/2020 08:38 AM   CREATININE 0.80 01/06/2020 03:32 PM   GFR 64.05 06/07/2020 08:38 AM   GFRNONAA 112.63 06/06/2010 08:46 AM   GFRAA  12/23/2007 01:55 PM    >60        The eGFR has been calculated using the MDRD equation. This calculation has not been validated in all clinical    Current antihyperglycemic regimen:  Acarbose 50 mg TID Metformin XR 500 #4 daily AM Repaglinide 2 mg TID with meals Bromocriptine 2.5 mg daily  What recent interventions/DTPs have been made to improve glycemic control:  None.  Have there been any recent hospitalizations or ED visits since last visit with CPP? Yes, urgent care visit on 06/15/20 for Upper Respiratory  Infection.   Patient reports hypoglycemic symptoms, including Sweaty   Patient reports hyperglycemic symptoms, including none   How often are you checking your blood sugar? Patient stated she usually checks her sugar  once daily   What are your blood sugars ranging? Patient stated her ranges have been high lately.  She stated this morning her blood sugar was 200 this morning after coffee with creamer. She stated on 07/29/20 her blood sugar was 185 morning, two hours after eating. Fasting: N/A Before meals: N/A After meals: N/A Bedtime: N/A  During the week, how often does your blood glucose  drop below 70? Patient stated Never.  Are you checking your feet daily/regularly?  Patient stated she checks her feet daily.   Adherence Review: Is the patient currently on a STATIN medication? Yes, Rosuvastatin 40 mg.   Is the patient currently on ACE/ARB medication? Yes, Lisinopril 5 mg.  Does the patient have >5 day gap between last estimated fill dates? No  Patient stated she doesn't have any questions or concerns about her medication at this time.    Follow-Up:  Pharmacist Review   Charlann Lange, [place] Clinical Pharmacist Assistant [phone]  Patient has appt with endo coming up.  Those sugars are expected after creamer in coffee.  6 minutes spent in review, coordination, and documentation.  Reviewed by: Beverly Milch, PharmD  Clinical Pharmacist [place] [phone]

## 2020-08-04 ENCOUNTER — Encounter (HOSPITAL_BASED_OUTPATIENT_CLINIC_OR_DEPARTMENT_OTHER): Payer: Self-pay

## 2020-08-04 ENCOUNTER — Ambulatory Visit: Payer: Medicare Other | Admitting: Family Medicine

## 2020-08-04 ENCOUNTER — Emergency Department (HOSPITAL_BASED_OUTPATIENT_CLINIC_OR_DEPARTMENT_OTHER): Payer: Medicare Other

## 2020-08-04 ENCOUNTER — Emergency Department (HOSPITAL_BASED_OUTPATIENT_CLINIC_OR_DEPARTMENT_OTHER)
Admission: EM | Admit: 2020-08-04 | Discharge: 2020-08-04 | Disposition: A | Discharge status: Home / Self Care | Payer: Medicare Other | Attending: Emergency Medicine | Admitting: Emergency Medicine

## 2020-08-04 ENCOUNTER — Other Ambulatory Visit: Payer: Self-pay

## 2020-08-04 ENCOUNTER — Telehealth: Payer: Self-pay

## 2020-08-04 DIAGNOSIS — Z7984 Long term (current) use of oral hypoglycemic drugs: Secondary | ICD-10-CM | POA: Insufficient documentation

## 2020-08-04 DIAGNOSIS — R0789 Other chest pain: Secondary | ICD-10-CM | POA: Diagnosis not present

## 2020-08-04 DIAGNOSIS — E1165 Type 2 diabetes mellitus with hyperglycemia: Secondary | ICD-10-CM | POA: Insufficient documentation

## 2020-08-04 DIAGNOSIS — E1151 Type 2 diabetes mellitus with diabetic peripheral angiopathy without gangrene: Secondary | ICD-10-CM | POA: Diagnosis not present

## 2020-08-04 DIAGNOSIS — I10 Essential (primary) hypertension: Secondary | ICD-10-CM | POA: Diagnosis not present

## 2020-08-04 DIAGNOSIS — Z87891 Personal history of nicotine dependence: Secondary | ICD-10-CM | POA: Insufficient documentation

## 2020-08-04 DIAGNOSIS — Z0289 Encounter for other administrative examinations: Secondary | ICD-10-CM

## 2020-08-04 DIAGNOSIS — Z79899 Other long term (current) drug therapy: Secondary | ICD-10-CM | POA: Diagnosis not present

## 2020-08-04 DIAGNOSIS — R079 Chest pain, unspecified: Secondary | ICD-10-CM | POA: Diagnosis not present

## 2020-08-04 DIAGNOSIS — Z7982 Long term (current) use of aspirin: Secondary | ICD-10-CM | POA: Insufficient documentation

## 2020-08-04 LAB — CBC WITH DIFFERENTIAL/PLATELET
Abs Immature Granulocytes: 0.03 10*3/uL (ref 0.00–0.07)
Basophils Absolute: 0 10*3/uL (ref 0.0–0.1)
Basophils Relative: 1 %
Eosinophils Absolute: 0.1 10*3/uL (ref 0.0–0.5)
Eosinophils Relative: 2 %
HCT: 38.2 % (ref 36.0–46.0)
Hemoglobin: 12.5 g/dL (ref 12.0–15.0)
Immature Granulocytes: 1 %
Lymphocytes Relative: 28 %
Lymphs Abs: 1.9 10*3/uL (ref 0.7–4.0)
MCH: 29.1 pg (ref 26.0–34.0)
MCHC: 32.7 g/dL (ref 30.0–36.0)
MCV: 88.8 fL (ref 80.0–100.0)
Monocytes Absolute: 0.4 10*3/uL (ref 0.1–1.0)
Monocytes Relative: 6 %
Neutro Abs: 4.3 10*3/uL (ref 1.7–7.7)
Neutrophils Relative %: 62 %
Platelets: 257 10*3/uL (ref 150–400)
RBC: 4.3 MIL/uL (ref 3.87–5.11)
RDW: 13.5 % (ref 11.5–15.5)
WBC: 6.7 10*3/uL (ref 4.0–10.5)
nRBC: 0 % (ref 0.0–0.2)

## 2020-08-04 LAB — COMPREHENSIVE METABOLIC PANEL
ALT: 31 U/L (ref 0–44)
AST: 28 U/L (ref 15–41)
Albumin: 4.2 g/dL (ref 3.5–5.0)
Alkaline Phosphatase: 60 U/L (ref 38–126)
Anion gap: 11 (ref 5–15)
BUN: 11 mg/dL (ref 8–23)
CO2: 26 mmol/L (ref 22–32)
Calcium: 9.6 mg/dL (ref 8.9–10.3)
Chloride: 101 mmol/L (ref 98–111)
Creatinine, Ser: 0.96 mg/dL (ref 0.44–1.00)
GFR, Estimated: >60 mL/min (ref >60)
Glucose, Bld: 323 mg/dL — ABNORMAL HIGH (ref 70–99)
Potassium: 3.4 mmol/L — ABNORMAL LOW (ref 3.5–5.1)
Sodium: 138 mmol/L (ref 135–145)
Total Bilirubin: 0.2 mg/dL — ABNORMAL LOW (ref 0.3–1.2)
Total Protein: 7.5 g/dL (ref 6.5–8.1)

## 2020-08-04 LAB — D-DIMER, QUANTITATIVE: D-Dimer, Quant: 0.47 ug/mL-FEU (ref 0.00–0.50)

## 2020-08-04 LAB — TROPONIN I (HIGH SENSITIVITY)
Troponin I (High Sensitivity): 4 ng/L (ref <18)
Troponin I (High Sensitivity): 4 ng/L (ref <18)

## 2020-08-04 MED ORDER — ASPIRIN 81 MG PO CHEW
162.0000 mg | CHEWABLE_TABLET | Freq: Once | ORAL | Status: AC
  Administered 2020-08-04: 162 mg via ORAL
  Filled 2020-08-04: qty 2

## 2020-08-04 NOTE — Telephone Encounter (Signed)
Nurse Assessment Nurse: Chestine Spore, RN, [place] Date/Time (Eastern Time): 08/04/2020 8:37:27 AM Confirm and document reason for call. If symptomatic, describe symptoms. ---caller states having chest on and off. left. pain yesterday when walking her regular 1 mile. has pain today when reaching up to get a cup out of cabinet. no SOB Does the patient have any new or worsening symptoms? ---Yes Will a triage be completed? ---Yes Related visit to physician within the last 2 weeks? ---No Does the PT have any chronic conditions? (i.e. diabetes, asthma, this includes High risk factors for pregnancy, etc.) ---Yes List chronic conditions. ---hypertension. diabetes Is this a behavioral health or substance abuse call? ---No Guidelines Guideline Title Affirmed Question Affirmed Notes Nurse Date/Time (Eastern Time) Chest Pain [1] Chest pain (or "angina") comes and goes AND [2] is happening more often (increasing in frequency) or getting worse (increasing in severity) (Exception: chest pains that last only a few seconds) Matherly, [place], [place] 08/04/2020 8:40:56 AM Disp. Time Eilene Ghazi Time) Disposition Final User 08/04/2020 8:35:10 AM Send to Urgent Queue Donato Heinz PLEASE NOTE: All timestamps contained within this report are represented as [place] Standard Time. CONFIDENTIALTY NOTICE: This fax transmission is intended only for the addressee. It contains information that is legally privileged, confidential or otherwise protected from use or disclosure. If you are not the intended recipient, you are strictly prohibited from reviewing, disclosing, copying using or disseminating any of this information or taking any action in reliance on or regarding this information. If you have received this fax in error, please notify us immediately by telephone so that we can arrange for its return to [place]. Phone: [phone], Toll-Free: [phone], Fax: [phone] Page: 2 of 2 Call Id: [id number] 08/04/2020  8:44:40 AM Go to ED Now Yes Chestine Spore, RN, [place] Moment Disagree/Comply Comply Caller Understands Yes PreDisposition Call Doctor Care Advice Given Per Guideline GO TO ED NOW: * You need to be seen in the Emergency Department. ANOTHER ADULT SHOULD DRIVE: * It is better and safer if another adult drives instead of you. CARE ADVICE given per Chest Pain (Adult) guideline. Comments User: Cheri Kearns, RN Date/Time Eilene Ghazi Time): 08/04/2020 8:45:47 AM caller states will go to ER now. please cancel 10 AM visit noted Referrals GO TO FACILITY OTHER - SPECIFY

## 2020-08-04 NOTE — Telephone Encounter (Signed)
Pt currently in the ED.

## 2020-08-04 NOTE — ED Triage Notes (Signed)
Pt reports that she has had intermittent non-radiating sharp chest pain that started last night several times and again this morning. Pt called pcp and instructed to come in to the ED for an evaluation.

## 2020-08-04 NOTE — Progress Notes (Signed)
Pt greeted at [place]. Pt changed into gown and placed on cardiac monitor. Bedside EKG obtained at this time as well as vitals. RN at bedside. RT will continue to monitor and be available as needed.

## 2020-08-04 NOTE — ED Provider Notes (Signed)
[facility] EMERGENCY DEPARTMENT Provider Note   CSN: [id number] Arrival date & time: 08/04/20  5726     History Chief Complaint  Patient presents with  . Chest Pain    Alyssa Ford is a 69 y.o. female.  70yo F w/ PMH below including HTN, HLD, T2DM, GERD who p/w chest pain. Yesterday afternoon while at rest, pt had a random, brief episode of left-sided, nonradiating chest pain that she states was sharp and lasted <30 seconds.  It happened once or twice more later in the day, a couple times overnight, and once or twice this morning.  She describes the pain as an "odd" or "peculiar" sensation with no associated other symptoms including no shortness of breath, nausea, vomiting, or diaphoresis.  Pain is not associated with exertion and sometimes happens while she is sitting still.  She denies any symptoms currently.  She had a prolonged respiratory illness in December and January but states that the symptoms have resolved and she denies any current cough/cold symptoms.  No leg swelling or pain, recent travel, history of blood clots, or history of cancer.   The history is provided by the patient.  Chest Pain      Past Medical History:  Diagnosis Date  . Anemia   . Anxiety   . Arthritis   . Cataract   . Chronic headaches   . Depression   . Diabetes mellitus   . Diverticulosis of colon (without mention of hemorrhage) 2003, 2008   Colonoscopy  . GERD (gastroesophageal reflux disease)   . Glaucoma   . Hiatal hernia 2003   EGD   . Hyperlipidemia   . Hypertension   . Thyroid nodule     Patient Active Problem List   Diagnosis Date Noted  . Trichimoniasis 04/27/2020  . Cervical cancer screening 04/27/2020  . Left thyroid nodule 04/13/2020  . Vaginal odor 01/11/2020  . Icterus 01/11/2020  . Uncontrolled type 2 diabetes mellitus with hyperglycemia ([place]) 09/22/2019  . De Quervain's disease (tenosynovitis) 09/22/2019  . Neck mass 07/11/2018  . Abnormal WBC count  05/29/2017  . Skin fissure 09/24/2015  . Allergic rhinitis 03/26/2014  . Wheezing 03/26/2014  . DM (diabetes mellitus) type II uncontrolled, periph vascular disorder ([place]) 08/25/2011  . Headache(784.0) 08/21/2011  . Acute sinusitis 04/26/2010  . URI 04/26/2010  . UNSPECIFIED CATARACT 02/23/2010  . POSTMENOPAUSAL STATUS 12/12/2007  . FATIGUE 09/11/2007  . THYROID NODULE 11/22/2006  . Diabetes ([place]) 11/22/2006  . Hyperlipidemia LDL goal <70 11/22/2006  . GLUCOMA 11/22/2006  . Essential hypertension 11/22/2006  . GERD 11/22/2006    Past Surgical History:  Procedure Laterality Date  . CARPAL TUNNEL RELEASE  2006  . CARPAL TUNNEL RELEASE  12/24/2007  . CATARACT EXTRACTION, BILATERAL     [place]---  sept and oct 2018  . CERVICAL FUSION  3/08  . CHOLECYSTECTOMY    . FASCIECTOMY Left 08/01/2018   Procedure: FASCIECTOMY LEFT RING;  Surgeon: Daryll Brod, MD;  Location: [facility];  Service: Orthopedics;  Laterality: Left;  UPPER ARM  . TRIGGER FINGER RELEASE Left 08/01/2018   Procedure: RELEASE TRIGGER FINGER/A-1 PULLEY LEFTRING;  Surgeon: Daryll Brod, MD;  Location: [facility];  Service: Orthopedics;  Laterality: Left;     OB History   No obstetric history on file.     Family History  Problem Relation Age of Onset  . Dementia Mother   . Breast cancer Mother 29  . Kidney failure Mother 31  died from kidney failure from abx given to her for pneumonia  . Hyperlipidemia Mother   . Hypertension Mother   . Diabetes Mother   . Cancer Mother 70       breast  . Parkinsonism Father   . Diabetes Father 82  . Hypertension Sister   . Diabetes Brother   . Hypertension Brother   . Heart attack Brother   . Heart disease Cousin 37       mi  . Colon cancer Neg Hx     Social History   Tobacco Use  . Smoking status: Former Smoker    Packs/day: 1.40    Years: 49.00    Pack years: 68.60    Types: Cigarettes    Quit date: 07/16/2015    Years since  quitting: 5.0  . Smokeless tobacco: Never Used  . Tobacco comment: e cig  Vaping Use  . Vaping Use: Every day  Substance Use Topics  . Alcohol use: Yes    Alcohol/week: 0.0 standard drinks    Comment: once weekly; wine and beer  . Drug use: No    Home Medications Prior to Admission medications   Medication Sig Start Date End Date Taking? Authorizing Provider  aspirin EC 81 MG tablet Take 81 mg by mouth daily.   Yes Carollee Herter, Yvonne R, DO  acarbose (PRECOSE) 50 MG tablet Take 1 tablet (50 mg total) by mouth 3 (three) times daily with meals. 01/21/20   Renato Shin, MD  amitriptyline (ELAVIL) 25 MG tablet Take 2 tablets (50 mg total) by mouth daily. 04/08/20   Ann Held, DO  azelastine (ASTELIN) 0.1 % nasal spray Place 1 spray into both nostrils 2 (two) times daily. Per ENT    [provider]  BIOTIN PO Take 1 tablet by mouth daily.     [provider]  bromocriptine (PARLODEL) 2.5 MG tablet TAKE 1 TABLET(2.5 MG) BY MOUTH DAILY 12/24/19   Renato Shin, MD  Coenzyme Q10 (CO Q 10 PO) Take 1 capsule by mouth daily.    [provider]  diazepam (VALIUM) 5 MG tablet TAKE 1 TABLET BY MOUTH EVERY NIGHT AT BEDTIME AS NEEDED 02/12/20   Carollee Herter, Alferd Apa, DO  doxycycline (VIBRA-TABS) 100 MG tablet Take 1 tablet (100 mg total) by mouth 2 (two) times daily. 06/29/20   Roma Schanz R, DO  fenofibrate 160 MG tablet Take 1 tablet (160 mg total) by mouth daily. 06/11/20   Ann Held, DO  Ferrous Sulfate (IRON SUPPLEMENT PO) Take 1 tablet by mouth 2 (two) times daily. 65mg     [provider]  fluticasone (FLONASE) 50 MCG/ACT nasal spray Place 2 sprays into both nostrils daily. 06/29/20   Ann Held, DO  latanoprost (XALATAN) 0.005 % ophthalmic solution Place 1 drop into both eyes at bedtime.  10/17/13   [provider]  lisinopril (ZESTRIL) 5 MG tablet TAKE 1 TABLET(5 MG) BY MOUTH DAILY 03/25/20   Carollee Herter, Alferd Apa, DO   metFORMIN (GLUCOPHAGE-XR) 500 MG 24 hr tablet TAKE 4 TABLETS(2000 MG) BY MOUTH DAILY WITH BREAKFAST 06/23/20   Renato Shin, MD  Naproxen Sodium (ALEVE PO) Take 2 tablets by mouth daily.     [provider]  omeprazole (PRILOSEC) 40 MG capsule Take 1 capsule (40 mg total) by mouth daily. 11/21/19   Roma Schanz R, DO  predniSONE (DELTASONE) 10 MG tablet TAKE 3 TABLETS PO QD FOR 3 DAYS THEN TAKE 2  TABLETS PO QD FOR 3 DAYS THEN TAKE 1 TABLET PO QD FOR 3 DAYS THEN TAKE 1/2 TAB PO QD FOR 3 DAYS 06/29/20   Carollee Herter, Alferd Apa, DO  predniSONE (DELTASONE) 20 MG tablet Take 20 mg by mouth 2 (two) times daily. 06/15/20   [provider]  repaglinide (PRANDIN) 2 MG tablet TAKE 1 TABLET(2 MG) BY MOUTH THREE TIMES DAILY BEFORE MEALS 06/23/20   Renato Shin, MD  rosuvastatin (CRESTOR) 40 MG tablet Take 1 tablet (40 mg total) by mouth at bedtime. 06/02/20   Ann Held, DO    Allergies    Codeine  Review of Systems   Review of Systems  Cardiovascular: Positive for chest pain.   All other systems reviewed and are negative except that which was mentioned in HPI  Physical Exam Updated Vital Signs BP (!) 148/81   Pulse 82   Temp 97.9 F (36.6 C) (Oral)   Resp 15   Ht 5\' 6"  (1.676 m)   Wt 73.5 kg   SpO2 99%   BMI 26.15 kg/m   Physical Exam Constitutional:      General: She is not in acute distress.    Appearance: Normal appearance.  HENT:     Head: Normocephalic and atraumatic.  Eyes:     Conjunctiva/sclera: Conjunctivae normal.  Cardiovascular:     Rate and Rhythm: Normal rate and regular rhythm.     Heart sounds: Normal heart sounds. No murmur heard.   Pulmonary:     Effort: Pulmonary effort is normal.     Breath sounds: Normal breath sounds.  Abdominal:     General: Abdomen is flat. Bowel sounds are normal. There is no distension.     Palpations: Abdomen is soft.     Tenderness: There is no abdominal tenderness.  Musculoskeletal:     Right  lower leg: No edema.     Left lower leg: No edema.  Skin:    General: Skin is warm and dry.  Neurological:     Mental Status: She is alert and oriented to person, place, and time.     Comments: fluent  Psychiatric:        Mood and Affect: Mood normal.        Behavior: Behavior normal.     ED Results / Procedures / Treatments   Labs (all labs ordered are listed, but only abnormal results are displayed) Labs Reviewed  COMPREHENSIVE METABOLIC PANEL - Abnormal; Notable for the following components:      Result Value   Potassium 3.4 (*)    Glucose, Bld 323 (*)    Total Bilirubin 0.2 (*)    All other components within normal limits  CBC WITH DIFFERENTIAL/PLATELET  D-DIMER, QUANTITATIVE (NOT AT [facility])  TROPONIN I (HIGH SENSITIVITY)  TROPONIN I (HIGH SENSITIVITY)    EKG EKG Interpretation  Date/Time:  Wednesday August 04 2020 09:25:24 EST Ventricular Rate:  90 PR Interval:    QRS Duration: 88 QT Interval:  367 QTC Calculation: 449 R Axis:   -16 Text Interpretation: Sinus rhythm Borderline left axis deviation Abnormal R-wave progression, early transition No significant change since last tracing Confirmed by Theotis Burrow [phone]) on 08/04/2020 9:28:33 AM   Radiology DG Chest 2 View  Result Date: 08/04/2020 CLINICAL DATA:  Intermittent chest pain for 2 days EXAM: CHEST - 2 VIEW COMPARISON:  06/04/2020 FINDINGS: Cardiac shadow is within normal limits. No focal infiltrate or sizable effusion is seen. No bony abnormality is noted. Postsurgical changes in  the cervical spine and upper abdomen are seen. IMPRESSION: No acute abnormality noted. Electronically Signed   By: Inez Catalina M.D.   On: 08/04/2020 10:16    Procedures Procedures   Medications Ordered in ED Medications  aspirin chewable tablet 162 mg (162 mg Oral Given 08/04/20 2574)    ED Course  I have reviewed the triage vital signs and the nursing notes.  Pertinent labs & imaging results that were available during my  care of the patient were reviewed by me and considered in my medical decision making (see chart for details).    MDM Rules/Calculators/A&P                          Well-appearing and no complaints on exam.  BP 176/85.  EKG shows sinus rhythm without acute ischemic changes.  She had already taken to aspirin prior to arrival, gave her 162mg  more.  Chest x-ray clear.  Lab work is reassuring with negative serial troponins and normal D-dimer.  Blood glucose mildly elevated but no evidence of DKA.  She has been noting higher blood glucose values at home and will contact her physician.  Regarding her chest pain, symptoms sound very atypical for ACS as episodes are random and only last 30 seconds.  I have recommended she follow-up with her PCP and have extensively reviewed return precautions with her. Final Clinical Impression(s) / ED Diagnoses Final diagnoses:  Atypical chest pain    Rx / DC Orders ED Discharge Orders    None       Shantale Holtmeyer, Wenda Overland, MD 08/04/20 [phone]

## 2020-08-04 NOTE — ED Notes (Signed)
ED Provider at bedside. 

## 2020-08-05 ENCOUNTER — Telehealth: Payer: Self-pay | Admitting: Endocrinology

## 2020-08-05 NOTE — Telephone Encounter (Signed)
Pt called stating her blood sugars have running high for the last week. Yesterday her blood sugars were in the 300s and she said she wanted to get in contact with her Dr to see what she can do.    Ph# [phone]

## 2020-08-06 ENCOUNTER — Ambulatory Visit (INDEPENDENT_AMBULATORY_CARE_PROVIDER_SITE_OTHER): Payer: Medicare Other | Admitting: Family Medicine

## 2020-08-06 ENCOUNTER — Encounter: Payer: Self-pay | Admitting: Family Medicine

## 2020-08-06 ENCOUNTER — Other Ambulatory Visit: Payer: Self-pay

## 2020-08-06 VITALS — BP 108/60 | HR 91 | Temp 98.3°F | Resp 18 | Ht 66.0 in | Wt 162.4 lb

## 2020-08-06 DIAGNOSIS — E1165 Type 2 diabetes mellitus with hyperglycemia: Secondary | ICD-10-CM | POA: Diagnosis not present

## 2020-08-06 DIAGNOSIS — R079 Chest pain, unspecified: Secondary | ICD-10-CM | POA: Diagnosis not present

## 2020-08-06 NOTE — Patient Instructions (Signed)

## 2020-08-06 NOTE — Assessment & Plan Note (Signed)
Er w/u normal but pt con't to have pain Will refer to cardiology  If pain worsens -- go to ER

## 2020-08-06 NOTE — Assessment & Plan Note (Signed)
Pt has left message with endo In the meantime -- since it is Friday afternoon I have instructed her to go ahead and take precose 100 mg tid if her blood sugars are still over 300 at home  And f/u endo Monday

## 2020-08-06 NOTE — Progress Notes (Signed)
Patient ID: Alyssa Ford, female    DOB: 15-Feb-1951  Age: 70 y.o. MRN: [id number]    Subjective:  Subjective  HPI Alyssa Ford presents for f/u er for chest pain.  W/u neg -- pt still c/o of chest pain that comes and goes.   She is concerned due to family hx heart disease around same age  No sob Pain comes and goes quickly ,  Not associated with exertion.  It can occur at rest.  No nv, no diaphoresis , no palpitations   Review of Systems  Constitutional: Negative for activity change, appetite change, fatigue and unexpected weight change.  Respiratory: Negative for cough and shortness of breath.   Cardiovascular: Positive for chest pain. Negative for palpitations.  Gastrointestinal: Negative for abdominal pain, nausea and vomiting.  Psychiatric/Behavioral: Negative for behavioral problems and dysphoric mood. The patient is not nervous/anxious.     History Past Medical History:  Diagnosis Date  . Anemia   . Anxiety   . Arthritis   . Cataract   . Chronic headaches   . Depression   . Diabetes mellitus   . Diverticulosis of colon (without mention of hemorrhage) 2003, 2008   Colonoscopy  . GERD (gastroesophageal reflux disease)   . Glaucoma   . Hiatal hernia 2003   EGD   . Hyperlipidemia   . Hypertension   . Thyroid nodule     She has a past surgical history that includes Cholecystectomy; Carpal tunnel release (2006); Carpal tunnel release (12/24/2007); Cervical fusion (3/08); Cataract extraction, bilateral; Fasciectomy (Left, 08/01/2018); and Trigger finger release (Left, 08/01/2018).   Her family history includes Breast cancer (age of onset: 24) in her mother; Cancer (age of onset: 60) in her mother; Dementia in her mother; Diabetes in her brother and mother; Diabetes (age of onset: 41) in her father; Heart attack in her brother; Heart disease (age of onset: 51) in her cousin; Hyperlipidemia in her mother; Hypertension in her brother, mother, and sister; Kidney failure (age of  onset: 35) in her mother; Parkinsonism in her father.She reports that she quit smoking about 5 years ago. Her smoking use included cigarettes. She has a 68.60 pack-year smoking history. She has never used smokeless tobacco. She reports current alcohol use. She reports that she does not use drugs.  Current Outpatient Medications on File Prior to Visit  Medication Sig Dispense Refill  . acarbose (PRECOSE) 50 MG tablet Take 1 tablet (50 mg total) by mouth 3 (three) times daily with meals. 270 tablet 3  . amitriptyline (ELAVIL) 25 MG tablet Take 2 tablets (50 mg total) by mouth daily. 180 tablet 1  . aspirin EC 81 MG tablet Take 81 mg by mouth daily.    Marland Kitchen azelastine (ASTELIN) 0.1 % nasal spray Place 1 spray into both nostrils 2 (two) times daily. Per ENT    . BIOTIN PO Take 1 tablet by mouth daily.     . bromocriptine (PARLODEL) 2.5 MG tablet TAKE 1 TABLET(2.5 MG) BY MOUTH DAILY 90 tablet 3  . Coenzyme Q10 (CO Q 10 PO) Take 1 capsule by mouth daily.    . diazepam (VALIUM) 5 MG tablet TAKE 1 TABLET BY MOUTH EVERY NIGHT AT BEDTIME AS NEEDED 30 tablet 0  . fenofibrate 160 MG tablet Take 1 tablet (160 mg total) by mouth daily. 30 tablet 2  . Ferrous Sulfate (IRON SUPPLEMENT PO) Take 1 tablet by mouth 2 (two) times daily. 65mg     . fluticasone (FLONASE) 50 MCG/ACT nasal spray  Place 2 sprays into both nostrils daily. 16 g 6  . latanoprost (XALATAN) 0.005 % ophthalmic solution Place 1 drop into both eyes at bedtime.     Marland Kitchen lisinopril (ZESTRIL) 5 MG tablet TAKE 1 TABLET(5 MG) BY MOUTH DAILY 90 tablet 3  . metFORMIN (GLUCOPHAGE-XR) 500 MG 24 hr tablet TAKE 4 TABLETS(2000 MG) BY MOUTH DAILY WITH BREAKFAST 360 tablet 0  . Naproxen Sodium (ALEVE PO) Take 2 tablets by mouth daily.     Marland Kitchen omeprazole (PRILOSEC) 40 MG capsule Take 1 capsule (40 mg total) by mouth daily. 90 capsule 3  . repaglinide (PRANDIN) 2 MG tablet TAKE 1 TABLET(2 MG) BY MOUTH THREE TIMES DAILY BEFORE MEALS 270 tablet 0  . rosuvastatin (CRESTOR)  40 MG tablet Take 1 tablet (40 mg total) by mouth at bedtime. 90 tablet 1   No current facility-administered medications on file prior to visit.     Objective:  Objective  Physical Exam Vitals and nursing note reviewed.  Constitutional:      Appearance: She is well-developed and well-nourished.  HENT:     Head: Normocephalic and atraumatic.  Eyes:     Extraocular Movements: EOM normal.     Conjunctiva/sclera: Conjunctivae normal.  Neck:     Thyroid: No thyromegaly.     Vascular: No carotid bruit or JVD.  Cardiovascular:     Rate and Rhythm: Normal rate and regular rhythm.     Heart sounds: Normal heart sounds. No murmur heard.   Pulmonary:     Effort: Pulmonary effort is normal. No respiratory distress.     Breath sounds: Normal breath sounds. No wheezing or rales.  Chest:     Chest wall: No tenderness.  Abdominal:     General: There is no distension.     Tenderness: There is no abdominal tenderness. There is no guarding or rebound.  Musculoskeletal:        General: No edema.     Cervical back: Normal range of motion and neck supple.  Neurological:     Mental Status: She is alert and oriented to person, place, and time.  Psychiatric:        Mood and Affect: Mood and affect normal.    BP 108/60 (BP Location: Right Arm, Patient Position: Sitting, Cuff Size: Normal)   Pulse 91   Temp 98.3 F (36.8 C) (Oral)   Resp 18   Ht 5\' 6"  (1.676 m)   Wt 162 lb 6.4 oz (73.7 kg)   SpO2 97%   BMI 26.21 kg/m  Wt Readings from Last 3 Encounters:  08/06/20 162 lb 6.4 oz (73.7 kg)  08/04/20 162 lb (73.5 kg)  04/27/20 164 lb 3.2 oz (74.5 kg)     Lab Results  Component Value Date   WBC 6.7 08/04/2020   HGB 12.5 08/04/2020   HCT 38.2 08/04/2020   PLT 257 08/04/2020   GLUCOSE 323 (H) 08/04/2020   CHOL 163 06/07/2020   TRIG 259.0 (H) 06/07/2020   HDL 41.60 06/07/2020   LDLDIRECT 87.0 06/07/2020   LDLCALC 59 09/22/2019   ALT 31 08/04/2020   AST 28 08/04/2020   NA 138  08/04/2020   K 3.4 (L) 08/04/2020   CL 101 08/04/2020   CREATININE 0.96 08/04/2020   BUN 11 08/04/2020   CO2 26 08/04/2020   TSH 5.40 (H) 04/22/2020   HGBA1C 6.7 (A) 04/22/2020   MICROALBUR 1.1 04/17/2019    DG Chest 2 View  Result Date: 08/04/2020 CLINICAL DATA:  Intermittent chest  pain for 2 days EXAM: CHEST - 2 VIEW COMPARISON:  06/04/2020 FINDINGS: Cardiac shadow is within normal limits. No focal infiltrate or sizable effusion is seen. No bony abnormality is noted. Postsurgical changes in the cervical spine and upper abdomen are seen. IMPRESSION: No acute abnormality noted. Electronically Signed   By: Inez Catalina M.D.   On: 08/04/2020 10:16     Assessment & Plan:  Plan  I have discontinued Alyssa Ford's predniSONE, doxycycline, and predniSONE. I am also having her maintain her BIOTIN PO, Naproxen Sodium (ALEVE PO), latanoprost, Ferrous Sulfate (IRON SUPPLEMENT PO), aspirin EC, Coenzyme Q10 (CO Q 10 PO), omeprazole, bromocriptine, acarbose, diazepam, lisinopril, amitriptyline, azelastine, rosuvastatin, fenofibrate, repaglinide, metFORMIN, and fluticasone.  No orders of the defined types were placed in this encounter.   Problem List Items Addressed This Visit      Unprioritized   Chest pain - Primary    Er w/u normal but pt con't to have pain Will refer to cardiology  If pain worsens -- go to ER       Relevant Orders   Ambulatory referral to Cardiology   Uncontrolled type 2 diabetes mellitus with hyperglycemia ([place])    Pt has left message with endo In the meantime -- since it is Friday afternoon I have instructed her to go ahead and take precose 100 mg tid if her blood sugars are still over 300 at home  And f/u endo Monday         Follow-up: Return if symptoms worsen or fail to improve, for labs in march ----- f/u 6 months .  Ann Held, DO

## 2020-08-07 MED ORDER — RYBELSUS 3 MG PO TABS: 3.0000 mg | ORAL_TABLET | Freq: Every day | ORAL | 11 refills | Status: DC

## 2020-08-07 NOTE — Telephone Encounter (Signed)
Pt stated ---blood sugar been up for the last 2 weeks, and took  prednisone 1 month not sure that's the reason why the sugar is up. Pt check sugar yesterday 198 am, 180 pm. Please advise.

## 2020-08-07 NOTE — Telephone Encounter (Signed)
I have sent a prescription to your pharmacy, to add "Rybelsus."  This is a brand name, as we are out of generics.  I don't know how long you will be on prednisone, so I have chosen 14 days of rx.

## 2020-08-09 NOTE — Telephone Encounter (Signed)
Notified pt Rx sent to the pharmacy by Dr. Loanne Drilling.

## 2020-08-12 ENCOUNTER — Ambulatory Visit
Admission: RE | Admit: 2020-08-12 | Discharge: 2020-08-12 | Disposition: A | Discharge status: Home / Self Care | Payer: Medicare Other | Source: Ambulatory Visit | Attending: Family Medicine | Admitting: Family Medicine

## 2020-08-12 ENCOUNTER — Other Ambulatory Visit: Payer: Self-pay

## 2020-08-12 DIAGNOSIS — Z1231 Encounter for screening mammogram for malignant neoplasm of breast: Secondary | ICD-10-CM | POA: Diagnosis not present

## 2020-08-17 NOTE — Progress Notes (Unsigned)
CARDIOLOGY CONSULT NOTE       Patient ID: Alyssa Ford MRN: [id number] DOB/AGE: May 31, 1951 70 y.o.  Admit date: (Not on file) Referring Physician: Etter Sjogren Primary Physician: Carollee Herter, Alferd Apa, DO Primary Cardiologist: New Reason for Consultation: Chest pain  Active Problems:   * No active hospital problems. *   HPI:  70 y.o. referred by Dr Etter Sjogren for chest pain Seen by primary 08/06/20.after ER visit for same 08/04/20 seen at  [facility];s HTN, HLD T2DM. Resting left sided non radiating pain in chest sharp lasted <30 seconds had 3-4 more brief episodes over next 24 hours No associated symptoms and not exertional She had prolonged bronchitis like illnesses earlier this winter with coughing ECG no acute changes, R/O CXR NAD d/c home Brother and cousin had premature CAD with MI's She quit smoking 5 years ago 53.6 pack year history of smoking   Quit smoking a couple years ago 87- long discussion with her about how toxic and bad this is for her No chest pain since ER visit   ROS All other systems reviewed and negative except as noted above  Past Medical History:  Diagnosis Date  . Anemia   . Anxiety   . Arthritis   . Cataract   . Chronic headaches   . Depression   . Diabetes mellitus   . Diverticulosis of colon (without mention of hemorrhage) 2003, 2008   Colonoscopy  . GERD (gastroesophageal reflux disease)   . Glaucoma   . Hiatal hernia 2003   EGD   . Hyperlipidemia   . Hypertension   . Thyroid nodule     Family History  Problem Relation Age of Onset  . Dementia Mother   . Breast cancer Mother 39  . Kidney failure Mother 98       died from kidney failure from abx given to her for pneumonia  . Hyperlipidemia Mother   . Hypertension Mother   . Diabetes Mother   . Cancer Mother 70       breast  . Parkinsonism Father   . Diabetes Father 109  . Hypertension Sister   . Diabetes Brother   . Hypertension Brother   . Heart attack Brother   . Heart  disease Cousin 73       mi  . Colon cancer Neg Hx     Social History   Socioeconomic History  . Marital status: Married    Spouse name: Not on file  . Number of children: Not on file  . Years of education: Not on file  . Highest education level: Not on file  Occupational History  . Occupation: retired  Tobacco Use  . Smoking status: Former Smoker    Packs/day: 1.40    Years: 49.00    Pack years: 68.60    Types: Cigarettes    Quit date: 07/16/2015    Years since quitting: 5.0  . Smokeless tobacco: Never Used  . Tobacco comment: e cig  Vaping Use  . Vaping Use: Every day  Substance and Sexual Activity  . Alcohol use: Yes    Alcohol/week: 0.0 standard drinks    Comment: once weekly; wine and beer  . Drug use: No  . Sexual activity: Yes    Partners: Male  Other Topics Concern  . Not on file  Social History Narrative   Exercise--  walk   Social Determinants of Health   Financial Resource Strain: Low Risk   . Difficulty of Paying Living Expenses:  Not hard at all  Food Insecurity: No Food Insecurity  . Worried About Charity fundraiser in the Last Year: Never true  . Ran Out of Food in the Last Year: Never true  Transportation Needs: No Transportation Needs  . Lack of Transportation (Medical): No  . Lack of Transportation (Non-Medical): No  Physical Activity: Sufficiently Active  . Days of Exercise per Week: 5 days  . Minutes of Exercise per Session: 30 min  Stress: Not on file  Social Connections: Not on file  Intimate Partner Violence: Not on file    Past Surgical History:  Procedure Laterality Date  . CARPAL TUNNEL RELEASE  2006  . CARPAL TUNNEL RELEASE  12/24/2007  . CATARACT EXTRACTION, BILATERAL     [place]---  sept and oct 2018  . CERVICAL FUSION  3/08  . CHOLECYSTECTOMY    . FASCIECTOMY Left 08/01/2018   Procedure: FASCIECTOMY LEFT RING;  Surgeon: Daryll Brod, MD;  Location: [facility];  Service: Orthopedics;  Laterality: Left;  UPPER ARM  .  TRIGGER FINGER RELEASE Left 08/01/2018   Procedure: RELEASE TRIGGER FINGER/A-1 PULLEY LEFTRING;  Surgeon: Daryll Brod, MD;  Location: [facility];  Service: Orthopedics;  Laterality: Left;      Current Outpatient Medications:  .  acarbose (PRECOSE) 50 MG tablet, Take 1 tablet (50 mg total) by mouth 3 (three) times daily with meals., Disp: 270 tablet, Rfl: 3 .  amitriptyline (ELAVIL) 25 MG tablet, Take 2 tablets (50 mg total) by mouth daily., Disp: 180 tablet, Rfl: 1 .  aspirin EC 81 MG tablet, Take 81 mg by mouth daily., Disp: , Rfl:  .  azelastine (ASTELIN) 0.1 % nasal spray, Place 1 spray into both nostrils 2 (two) times daily. Per ENT, Disp: , Rfl:  .  BIOTIN PO, Take 1 tablet by mouth daily. , Disp: , Rfl:  .  bromocriptine (PARLODEL) 2.5 MG tablet, TAKE 1 TABLET(2.5 MG) BY MOUTH DAILY, Disp: 90 tablet, Rfl: 3 .  Coenzyme Q10 (CO Q 10 PO), Take 1 capsule by mouth daily., Disp: , Rfl:  .  diazepam (VALIUM) 5 MG tablet, TAKE 1 TABLET BY MOUTH EVERY NIGHT AT BEDTIME AS NEEDED, Disp: 30 tablet, Rfl: 0 .  fenofibrate 160 MG tablet, Take 1 tablet (160 mg total) by mouth daily., Disp: 30 tablet, Rfl: 2 .  Ferrous Sulfate (IRON SUPPLEMENT PO), Take 1 tablet by mouth 2 (two) times daily. 65mg , Disp: , Rfl:  .  fluticasone (FLONASE) 50 MCG/ACT nasal spray, Place 2 sprays into both nostrils daily., Disp: 16 g, Rfl: 6 .  latanoprost (XALATAN) 0.005 % ophthalmic solution, Place 1 drop into both eyes at bedtime. , Disp: , Rfl:  .  lisinopril (ZESTRIL) 5 MG tablet, TAKE 1 TABLET(5 MG) BY MOUTH DAILY, Disp: 90 tablet, Rfl: 3 .  metFORMIN (GLUCOPHAGE-XR) 500 MG 24 hr tablet, TAKE 4 TABLETS(2000 MG) BY MOUTH DAILY WITH BREAKFAST, Disp: 360 tablet, Rfl: 0 .  Naproxen Sodium (ALEVE PO), Take 2 tablets by mouth daily. , Disp: , Rfl:  .  omeprazole (PRILOSEC) 40 MG capsule, Take 1 capsule (40 mg total) by mouth daily., Disp: 90 capsule, Rfl: 3 .  repaglinide (PRANDIN) 2 MG tablet, TAKE 1 TABLET(2 MG)  BY MOUTH THREE TIMES DAILY BEFORE MEALS, Disp: 270 tablet, Rfl: 0 .  rosuvastatin (CRESTOR) 40 MG tablet, Take 1 tablet (40 mg total) by mouth at bedtime., Disp: 90 tablet, Rfl: 1 .  Semaglutide (RYBELSUS) 3 MG TABS, Take 3  mg by mouth daily., Disp: 14 tablet, Rfl: 11    Physical Exam: Blood pressure 116/64, pulse 91, height 5\' 6"  (1.676 m), weight 73 kg, SpO2 98 %.   Affect appropriate Healthy:  appears stated age 60: normal Neck supple with no adenopathy JVP normal no bruits no thyromegaly Lungs clear with no wheezing and good diaphragmatic motion Heart:  S1/S2 no murmur, no rub, gallop or click PMI normal Abdomen: benighn, BS positve, no tenderness, no AAA no bruit.  No HSM or HJR Distal pulses intact with no bruits No edema Neuro non-focal Skin warm and dry No muscular weakness   Labs:   Lab Results  Component Value Date   WBC 6.7 08/04/2020   HGB 12.5 08/04/2020   HCT 38.2 08/04/2020   MCV 88.8 08/04/2020   PLT 257 08/04/2020   No results for input(s): NA, K, CL, CO2, BUN, CREATININE, CALCIUM, PROT, BILITOT, ALKPHOS, ALT, AST, GLUCOSE in the last 168 hours.  Invalid input(s): LABALBU No results found for: CKTOTAL, CKMB, CKMBINDEX, TROPONINI  Lab Results  Component Value Date   CHOL 163 06/07/2020   CHOL 125 09/22/2019   CHOL 134 04/17/2019   Lab Results  Component Value Date   HDL 41.60 06/07/2020   HDL 41.70 09/22/2019   HDL 46.10 04/17/2019   Lab Results  Component Value Date   LDLCALC 59 09/22/2019   LDLCALC 57 04/17/2019   LDLCALC 57 01/27/2019   Lab Results  Component Value Date   TRIG 259.0 (H) 06/07/2020   TRIG 123.0 09/22/2019   TRIG 154.0 (H) 04/17/2019   Lab Results  Component Value Date   CHOLHDL 4 06/07/2020   CHOLHDL 3 09/22/2019   CHOLHDL 3 04/17/2019   Lab Results  Component Value Date   LDLDIRECT 87.0 06/07/2020   LDLDIRECT 103.0 04/08/2018   LDLDIRECT 197.0 08/25/2011      Radiology: DG Chest 2 View  Result Date:  08/04/2020 CLINICAL DATA:  Intermittent chest pain for 2 days EXAM: CHEST - 2 VIEW COMPARISON:  06/04/2020 FINDINGS: Cardiac shadow is within normal limits. No focal infiltrate or sizable effusion is seen. No bony abnormality is noted. Postsurgical changes in the cervical spine and upper abdomen are seen. IMPRESSION: No acute abnormality noted. Electronically Signed   By: Inez Catalina M.D.   On: 08/04/2020 10:16   MM 3D SCREEN BREAST BILATERAL  Result Date: 08/16/2020 CLINICAL DATA:  Screening. EXAM: DIGITAL SCREENING BILATERAL MAMMOGRAM WITH TOMOSYNTHESIS AND CAD TECHNIQUE: Bilateral screening digital craniocaudal and mediolateral oblique mammograms were obtained. Bilateral screening digital breast tomosynthesis was performed. The images were evaluated with computer-aided detection. COMPARISON:  Previous exam(s). ACR Breast Density Category c: The breast tissue is heterogeneously dense, which may obscure small masses. FINDINGS: There are no findings suspicious for malignancy. IMPRESSION: No mammographic evidence of malignancy. A result letter of this screening mammogram will be mailed directly to the patient. RECOMMENDATION: Screening mammogram in one year. (Code:SM-B-01Y) BI-RADS CATEGORY  1: Negative. Electronically Signed   By: Lovey Newcomer M.D.   On: 08/16/2020 15:54    EKG: SR rate 90 poor R wave progression    ASSESSMENT AND PLAN:   1. Chest Pain: very atypical R/O in ER ECG with minor abnormalities CRF;s HLD, family history , HTN and DM-2 HR a bit high for cardiac CT and noted some PACls on exam will order lexiscan myovue to risk stratify 2. HTN:  Well controlled.  Continue current medications and low sodium Dash type diet.   3. HLD  Continue statin  4. DM:  Discussed low carb diet.  Target hemoglobin A1c is 6.5 or less.  Continue current medications. 5. Smoking see above regarding lung cancer screening per primary D/C vaping !!!  F/U PRN if myovue normal    Signed: Jenkins Rouge 08/19/2020, 3:44 PM

## 2020-08-19 ENCOUNTER — Other Ambulatory Visit: Payer: Self-pay

## 2020-08-19 ENCOUNTER — Ambulatory Visit: Payer: Medicare Other | Admitting: Cardiovascular Disease

## 2020-08-19 ENCOUNTER — Encounter: Payer: Self-pay | Admitting: Cardiovascular Disease

## 2020-08-19 VITALS — BP 116/64 | HR 91 | Ht 66.0 in | Wt 161.0 lb

## 2020-08-19 DIAGNOSIS — R079 Chest pain, unspecified: Secondary | ICD-10-CM | POA: Diagnosis not present

## 2020-08-19 DIAGNOSIS — I10 Essential (primary) hypertension: Secondary | ICD-10-CM | POA: Diagnosis not present

## 2020-08-19 DIAGNOSIS — E782 Mixed hyperlipidemia: Secondary | ICD-10-CM | POA: Diagnosis not present

## 2020-08-19 NOTE — Patient Instructions (Signed)
Medication Instructions:  *If you need a refill on your cardiac medications before your next appointment, please call your pharmacy*  Lab Work: If you have labs (blood work) drawn today and your tests are completely normal, you will receive your results only by: Marland Kitchen MyChart Message (if you have MyChart) OR . A paper copy in the mail If you have any lab test that is abnormal or we need to change your treatment, we will call you to review the results.  Testing/Procedures: Your physician has requested that you have a lexiscan myoview. For further information please visit [email or web address]. Please follow instruction sheet, as given.  Follow-Up: At [facility], you and your health needs are our priority.  As part of our continuing mission to provide you with exceptional heart care, we have created designated Provider Care Teams.  These Care Teams include your primary Cardiologist (physician) and Advanced Practice Providers (APPs -  Physician Assistants and Nurse Practitioners) who all work together to provide you with the care you need, when you need it.  We recommend signing up for the patient portal called "MyChart".  Sign up information is provided on this After Visit Summary.  MyChart is used to connect with patients for Virtual Visits (Telemedicine).  Patients are able to view lab/test results, encounter notes, upcoming appointments, etc.  Non-urgent messages can be sent to your provider as well.   To learn more about what you can do with MyChart, go to [email or web address].    Your next appointment:   As needed  The format for your next appointment:   In Person  Provider:   You may see Dr. Johnsie Cancel or one of the following Advanced Practice Providers on your designated Care Team:    Kathyrn Drown, NP

## 2020-08-20 ENCOUNTER — Other Ambulatory Visit: Payer: Self-pay

## 2020-08-24 ENCOUNTER — Ambulatory Visit: Payer: Medicare Other | Admitting: Endocrinology

## 2020-08-24 ENCOUNTER — Other Ambulatory Visit: Payer: Self-pay

## 2020-08-24 VITALS — BP 138/68 | HR 74 | Ht 66.0 in | Wt 160.8 lb

## 2020-08-24 DIAGNOSIS — E1165 Type 2 diabetes mellitus with hyperglycemia: Secondary | ICD-10-CM | POA: Diagnosis not present

## 2020-08-24 DIAGNOSIS — E1142 Type 2 diabetes mellitus with diabetic polyneuropathy: Secondary | ICD-10-CM | POA: Diagnosis not present

## 2020-08-24 DIAGNOSIS — E039 Hypothyroidism, unspecified: Secondary | ICD-10-CM | POA: Diagnosis not present

## 2020-08-24 DIAGNOSIS — E1151 Type 2 diabetes mellitus with diabetic peripheral angiopathy without gangrene: Secondary | ICD-10-CM

## 2020-08-24 DIAGNOSIS — IMO0002 Reserved for concepts with insufficient information to code with codable children: Secondary | ICD-10-CM

## 2020-08-24 LAB — POCT GLYCOSYLATED HEMOGLOBIN (HGB A1C): Hemoglobin A1C: 7.6 % — AB (ref 4.0–5.6)

## 2020-08-24 LAB — T4, FREE: Free T4: 1.31 ng/dL (ref 0.60–1.60)

## 2020-08-24 LAB — TSH: TSH: 6.51 u[IU]/mL — ABNORMAL HIGH (ref 0.35–4.50)

## 2020-08-24 MED ORDER — RYBELSUS 7 MG PO TABS: 7.0000 mg | ORAL_TABLET | Freq: Every day | ORAL | 3 refills | Status: DC

## 2020-08-24 NOTE — Patient Instructions (Addendum)
I have sent a prescription to your pharmacy, to increase the Rybelsus.   Please continue the same other 4 diabetes medications.  Thyroid blood tests are requested for you today.  We'll let you know about the results.  check your blood sugar once a day.  vary the time of day when you check, between before the 3 meals, and at bedtime.  also check if you have symptoms of your blood sugar being too high or too low.  please keep a record of the readings and bring it to your next appointment here (or you can bring the meter itself).  You can write it on any piece of paper.  please call us sooner if your blood sugar goes below 70, or if you have a lot of readings over 200.  Please come back for a follow-up appointment in 3 months.

## 2020-08-24 NOTE — Progress Notes (Signed)
Subjective:    Patient ID: Alyssa Ford, female    DOB: 04-Apr-1951, 70 y.o.   MRN: [id number]  HPI Pt returns for f/u of diabetes mellitus:  DM type: 2 Dx'ed: 9735 Complications: PN and CAD (seen on CT).   Therapy: 5 oral meds GDM: never DKA: never Severe hypoglycemia: never Pancreatitis: never Pancreatic imaging: never Other: she has never been on insulin; SDOH: she wants generic meds, if possible.   Interval history: Pt states cbg's are in the high-100's.  She takes meds as rx'ed.  pt states she feels well in general.    Pt also has MNG ([place] in 2020 showed Left lower pole nodule 1 has slightly enlarged compared with greater than 2010; CT 9/20 was little changed; TSH is normal off rx).  She will have a f/u US checked at [facility] soon.   Past Medical History:  Diagnosis Date  . Anemia   . Anxiety   . Arthritis   . Cataract   . Chronic headaches   . Depression   . Diabetes mellitus   . Diverticulosis of colon (without mention of hemorrhage) 2003, 2008   Colonoscopy  . GERD (gastroesophageal reflux disease)   . Glaucoma   . Hiatal hernia 2003   EGD   . Hyperlipidemia   . Hypertension   . Thyroid nodule     Past Surgical History:  Procedure Laterality Date  . CARPAL TUNNEL RELEASE  2006  . CARPAL TUNNEL RELEASE  12/24/2007  . CATARACT EXTRACTION, BILATERAL     [place]---  sept and oct 2018  . CERVICAL FUSION  3/08  . CHOLECYSTECTOMY    . FASCIECTOMY Left 08/01/2018   Procedure: FASCIECTOMY LEFT RING;  Surgeon: Daryll Brod, MD;  Location: [place];  Service: Orthopedics;  Laterality: Left;  UPPER ARM  . TRIGGER FINGER RELEASE Left 08/01/2018   Procedure: RELEASE TRIGGER FINGER/A-1 PULLEY LEFTRING;  Surgeon: Daryll Brod, MD;  Location: [place];  Service: Orthopedics;  Laterality: Left;    Social History   Socioeconomic History  . Marital status: Married    Spouse name: Not on file  . Number of children: Not on file  . Years of  education: Not on file  . Highest education level: Not on file  Occupational History  . Occupation: retired  Tobacco Use  . Smoking status: Former Smoker    Packs/day: 1.40    Years: 49.00    Pack years: 68.60    Types: Cigarettes    Quit date: 07/16/2015    Years since quitting: 5.1  . Smokeless tobacco: Never Used  . Tobacco comment: e cig  Vaping Use  . Vaping Use: Every day  Substance and Sexual Activity  . Alcohol use: Yes    Alcohol/week: 0.0 standard drinks    Comment: once weekly; wine and beer  . Drug use: No  . Sexual activity: Yes    Partners: Male  Other Topics Concern  . Not on file  Social History Narrative   Exercise--  walk   Social Determinants of Health   Financial Resource Strain: Low Risk   . Difficulty of Paying Living Expenses: Not hard at all  Food Insecurity: No Food Insecurity  . Worried About Charity fundraiser in the Last Year: Never true  . Ran Out of Food in the Last Year: Never true  Transportation Needs: No Transportation Needs  . Lack of Transportation (Medical): No  . Lack of Transportation (Non-Medical): No  Physical  Activity: Sufficiently Active  . Days of Exercise per Week: 5 days  . Minutes of Exercise per Session: 30 min  Stress: Not on file  Social Connections: Not on file  Intimate Partner Violence: Not on file    Current Outpatient Medications on File Prior to Visit  Medication Sig Dispense Refill  . acarbose (PRECOSE) 50 MG tablet Take 1 tablet (50 mg total) by mouth 3 (three) times daily with meals. 270 tablet 3  . amitriptyline (ELAVIL) 25 MG tablet Take 2 tablets (50 mg total) by mouth daily. 180 tablet 1  . aspirin EC 81 MG tablet Take 81 mg by mouth daily.    Marland Kitchen azelastine (ASTELIN) 0.1 % nasal spray Place 1 spray into both nostrils 2 (two) times daily. Per ENT    . BIOTIN PO Take 1 tablet by mouth daily.     . bromocriptine (PARLODEL) 2.5 MG tablet TAKE 1 TABLET(2.5 MG) BY MOUTH DAILY 90 tablet 3  . Coenzyme Q10 (CO  Q 10 PO) Take 1 capsule by mouth daily.    . diazepam (VALIUM) 5 MG tablet TAKE 1 TABLET BY MOUTH EVERY NIGHT AT BEDTIME AS NEEDED 30 tablet 0  . fenofibrate 160 MG tablet Take 1 tablet (160 mg total) by mouth daily. 30 tablet 2  . Ferrous Sulfate (IRON SUPPLEMENT PO) Take 1 tablet by mouth 2 (two) times daily. 65mg     . fluticasone (FLONASE) 50 MCG/ACT nasal spray Place 2 sprays into both nostrils daily. 16 g 6  . latanoprost (XALATAN) 0.005 % ophthalmic solution Place 1 drop into both eyes at bedtime.     Marland Kitchen lisinopril (ZESTRIL) 5 MG tablet TAKE 1 TABLET(5 MG) BY MOUTH DAILY 90 tablet 3  . metFORMIN (GLUCOPHAGE-XR) 500 MG 24 hr tablet TAKE 4 TABLETS(2000 MG) BY MOUTH DAILY WITH BREAKFAST 360 tablet 0  . Naproxen Sodium (ALEVE PO) Take 2 tablets by mouth daily.     Marland Kitchen omeprazole (PRILOSEC) 40 MG capsule Take 1 capsule (40 mg total) by mouth daily. 90 capsule 3  . repaglinide (PRANDIN) 2 MG tablet TAKE 1 TABLET(2 MG) BY MOUTH THREE TIMES DAILY BEFORE MEALS 270 tablet 0  . rosuvastatin (CRESTOR) 40 MG tablet Take 1 tablet (40 mg total) by mouth at bedtime. 90 tablet 1   No current facility-administered medications on file prior to visit.    Allergies  Allergen Reactions  . Codeine Nausea Only    Severe nausea    Family History  Problem Relation Age of Onset  . Dementia Mother   . Breast cancer Mother 14  . Kidney failure Mother 24       died from kidney failure from abx given to her for pneumonia  . Hyperlipidemia Mother   . Hypertension Mother   . Diabetes Mother   . Cancer Mother 54       breast  . Parkinsonism Father   . Diabetes Father 43  . Hypertension Sister   . Diabetes Brother   . Hypertension Brother   . Heart attack Brother   . Heart disease Cousin 70       mi  . Colon cancer Neg Hx     BP 138/68 (BP Location: Right Arm, Patient Position: Sitting, Cuff Size: Normal)   Pulse 74   Ht 5\' 6"  (1.676 m)   Wt 160 lb 12.8 oz (72.9 kg)   SpO2 97%   BMI 25.95 kg/m     Review of Systems She denies hypoglycemia.  Objective:   Physical Exam VITAL SIGNS:  See vs page.  GENERAL: no distress Pulses: dorsalis pedis intact bilat.   MSK: no deformity of the feet CV: no leg edema Skin:  no ulcer on the feet.  normal color and temp on the feet. Neuro: sensation is intact to touch on the feet.   Lab Results  Component Value Date   HGBA1C 7.6 (A) 08/24/2020        Assessment & Plan:  Type 2 DM, uncontrolled. MNG, due for recheck.  Patient Instructions  I have sent a prescription to your pharmacy, to increase the [place]belsus.   Please continue the same other 4 diabetes medications.  Thyroid blood tests are requested for you today.  We'll let you know about the results.  check your blood sugar once a day.  vary the time of day when you check, between before the 3 meals, and at bedtime.  also check if you have symptoms of your blood sugar being too high or too low.  please keep a record of the readings and bring it to your next appointment here (or you can bring the meter itself).  You can write it on any piece of paper.  please call us sooner if your blood sugar goes below 70, or if you have a lot of readings over 200.  Please come back for a follow-up appointment in 3 months.

## 2020-08-26 ENCOUNTER — Telehealth (HOSPITAL_COMMUNITY): Payer: Self-pay

## 2020-08-26 ENCOUNTER — Telehealth: Payer: Self-pay | Admitting: Endocrinology

## 2020-08-26 NOTE — Telephone Encounter (Signed)
Pt called because she doesn't understand the notes on her AVS from her last office visit and would like someone to give her a call to go over the instructions.  Ph# [phone]

## 2020-08-26 NOTE — Telephone Encounter (Signed)
Detailed instructions left on the patient's answering machine. Asked to call back with any questions. S.Williams EMTP 

## 2020-08-26 NOTE — Telephone Encounter (Signed)
Spoke with pt to help her better understand the notes on her last OV.

## 2020-08-31 ENCOUNTER — Ambulatory Visit (HOSPITAL_COMMUNITY): Payer: Medicare Other | Attending: Cardiology

## 2020-08-31 ENCOUNTER — Other Ambulatory Visit: Payer: Self-pay

## 2020-08-31 DIAGNOSIS — R079 Chest pain, unspecified: Secondary | ICD-10-CM | POA: Insufficient documentation

## 2020-08-31 DIAGNOSIS — E782 Mixed hyperlipidemia: Secondary | ICD-10-CM | POA: Insufficient documentation

## 2020-08-31 DIAGNOSIS — I10 Essential (primary) hypertension: Secondary | ICD-10-CM | POA: Insufficient documentation

## 2020-08-31 LAB — MYOCARDIAL PERFUSION IMAGING
LV dias vol: 59 mL (ref 46–106)
LV sys vol: 22 mL
Peak HR: 89 {beats}/min
Rest HR: 72 {beats}/min
SDS: 2
SRS: 0
SSS: 2
TID: 1.13

## 2020-08-31 MED ORDER — TECHNETIUM TC 99M TETROFOSMIN IV KIT
9.9000 | PACK | Freq: Once | INTRAVENOUS | Status: AC | PRN
  Administered 2020-08-31: 9.9 via INTRAVENOUS
  Filled 2020-08-31: qty 10

## 2020-08-31 MED ORDER — REGADENOSON 0.4 MG/5ML IV SOLN
0.4000 mg | Freq: Once | INTRAVENOUS | Status: AC
  Administered 2020-08-31: 0.4 mg via INTRAVENOUS

## 2020-08-31 MED ORDER — TECHNETIUM TC 99M TETROFOSMIN IV KIT
30.9000 | PACK | Freq: Once | INTRAVENOUS | Status: AC | PRN
  Administered 2020-08-31: 30.9 via INTRAVENOUS
  Filled 2020-08-31: qty 31

## 2020-09-06 ENCOUNTER — Other Ambulatory Visit: Payer: Self-pay

## 2020-09-06 ENCOUNTER — Other Ambulatory Visit (INDEPENDENT_AMBULATORY_CARE_PROVIDER_SITE_OTHER): Payer: Medicare Other

## 2020-09-06 DIAGNOSIS — E1169 Type 2 diabetes mellitus with other specified complication: Secondary | ICD-10-CM

## 2020-09-06 DIAGNOSIS — E785 Hyperlipidemia, unspecified: Secondary | ICD-10-CM

## 2020-09-06 LAB — COMPREHENSIVE METABOLIC PANEL
ALT: 27 U/L (ref 0–35)
AST: 20 U/L (ref 0–37)
Albumin: 4.5 g/dL (ref 3.5–5.2)
Alkaline Phosphatase: 49 U/L (ref 39–117)
BUN: 18 mg/dL (ref 6–23)
CO2: 31 mEq/L (ref 19–32)
Calcium: 10.3 mg/dL (ref 8.4–10.5)
Chloride: 100 mEq/L (ref 96–112)
Creatinine, Ser: 0.94 mg/dL (ref 0.40–1.20)
GFR: 61.5 mL/min (ref >60.00)
Glucose, Bld: 128 mg/dL — ABNORMAL HIGH (ref 70–99)
Potassium: 4.4 mEq/L (ref 3.5–5.1)
Sodium: 139 mEq/L (ref 135–145)
Total Bilirubin: 0.4 mg/dL (ref 0.2–1.2)
Total Protein: 7.1 g/dL (ref 6.0–8.3)

## 2020-09-06 LAB — LIPID PANEL
Cholesterol: 110 mg/dL (ref 0–200)
HDL: 38.2 mg/dL — ABNORMAL LOW (ref >39.00)
LDL Cholesterol: 46 mg/dL (ref 0–99)
NonHDL: 71.73
Total CHOL/HDL Ratio: 3
Triglycerides: 128 mg/dL (ref 0.0–149.0)
VLDL: 25.6 mg/dL (ref 0.0–40.0)

## 2020-09-07 ENCOUNTER — Other Ambulatory Visit: Payer: Self-pay | Admitting: Family Medicine

## 2020-09-21 ENCOUNTER — Other Ambulatory Visit: Payer: Self-pay | Admitting: Endocrinology

## 2020-09-21 ENCOUNTER — Other Ambulatory Visit: Payer: Self-pay | Admitting: Family Medicine

## 2020-09-21 DIAGNOSIS — E1169 Type 2 diabetes mellitus with other specified complication: Secondary | ICD-10-CM

## 2020-09-21 DIAGNOSIS — E785 Hyperlipidemia, unspecified: Secondary | ICD-10-CM

## 2020-09-21 DIAGNOSIS — G4700 Insomnia, unspecified: Secondary | ICD-10-CM

## 2020-09-21 DIAGNOSIS — IMO0002 Reserved for concepts with insufficient information to code with codable children: Secondary | ICD-10-CM

## 2020-09-21 DIAGNOSIS — E1151 Type 2 diabetes mellitus with diabetic peripheral angiopathy without gangrene: Secondary | ICD-10-CM

## 2020-09-26 ENCOUNTER — Other Ambulatory Visit: Payer: Self-pay | Admitting: Family Medicine

## 2020-09-26 DIAGNOSIS — F411 Generalized anxiety disorder: Secondary | ICD-10-CM

## 2020-09-27 NOTE — Telephone Encounter (Signed)
Requesting: diazepam 5mg  Contract: 03/15/2018 UDS: 03/22/2018 Last Visit: 08/06/2020 Next Visit: 02/03/2021 Last Refill: 02/12/2020 #30 and 0RF  Please Advise

## 2020-09-29 DIAGNOSIS — M542 Cervicalgia: Secondary | ICD-10-CM | POA: Diagnosis not present

## 2020-09-29 DIAGNOSIS — Z981 Arthrodesis status: Secondary | ICD-10-CM | POA: Diagnosis not present

## 2020-09-29 DIAGNOSIS — I7090 Unspecified atherosclerosis: Secondary | ICD-10-CM | POA: Diagnosis not present

## 2020-09-29 DIAGNOSIS — M549 Dorsalgia, unspecified: Secondary | ICD-10-CM | POA: Diagnosis not present

## 2020-09-29 DIAGNOSIS — Z79899 Other long term (current) drug therapy: Secondary | ICD-10-CM | POA: Diagnosis not present

## 2020-10-29 DIAGNOSIS — M898X1 Other specified disorders of bone, shoulder: Secondary | ICD-10-CM | POA: Diagnosis not present

## 2020-10-29 DIAGNOSIS — M542 Cervicalgia: Secondary | ICD-10-CM | POA: Diagnosis not present

## 2020-11-10 ENCOUNTER — Telehealth: Payer: Self-pay | Admitting: Pharmacist

## 2020-11-10 ENCOUNTER — Encounter: Payer: Self-pay | Admitting: Family Medicine

## 2020-11-10 ENCOUNTER — Ambulatory Visit (INDEPENDENT_AMBULATORY_CARE_PROVIDER_SITE_OTHER): Payer: Medicare Other | Admitting: Pharmacist

## 2020-11-10 DIAGNOSIS — E041 Nontoxic single thyroid nodule: Secondary | ICD-10-CM

## 2020-11-10 DIAGNOSIS — K219 Gastro-esophageal reflux disease without esophagitis: Secondary | ICD-10-CM

## 2020-11-10 DIAGNOSIS — G4700 Insomnia, unspecified: Secondary | ICD-10-CM

## 2020-11-10 DIAGNOSIS — E118 Type 2 diabetes mellitus with unspecified complications: Secondary | ICD-10-CM

## 2020-11-10 DIAGNOSIS — E039 Hypothyroidism, unspecified: Secondary | ICD-10-CM | POA: Diagnosis not present

## 2020-11-10 DIAGNOSIS — I10 Essential (primary) hypertension: Secondary | ICD-10-CM

## 2020-11-10 DIAGNOSIS — E785 Hyperlipidemia, unspecified: Secondary | ICD-10-CM | POA: Diagnosis not present

## 2020-11-10 DIAGNOSIS — E1165 Type 2 diabetes mellitus with hyperglycemia: Secondary | ICD-10-CM | POA: Diagnosis not present

## 2020-11-10 NOTE — Chronic Care Management (AMB) (Signed)
Chronic Care Management Pharmacy Note  11/10/2020 Name:  Alyssa Ford MRN:  [id number] DOB:  20-Feb-1951  Subjective: Alyssa Ford is an 70 y.o. year old female who is a primary patient of Alyssa Held, DO.  The CCM team was consulted for assistance with disease management and care coordination needs.    Engaged with patient by telephone for follow up visit in response to provider referral for pharmacy case management and/or care coordination services.   Consent to Services:  The patient was given information about Chronic Care Management services, agreed to services, and gave verbal consent prior to initiation of services.  Please see initial visit note for detailed documentation.   Patient Care Team: Carollee Herter, Alferd Apa, DO as PCP - General Calvert Cantor, MD as Consulting Physician (Ophthalmology) Levy Sjogren, MD as Referring Physician (Dermatology) Ilda Foil, Sol Blazing, MD as Referring Physician (Pediatrics) Renato Shin, MD as Consulting Physician (Endocrinology) Cherre Robins, PharmD (Pharmacist)  Recent office visits: . 08/06/2020 - PCP (Dr Etter Sjogren) - ER Followup - chest pain. Referred to cardio for workup of CP; Patient also mentioned BG elevated. Recommended take acarbose $RemoveBefor'100mg'ddtZzByMVnuu$  tid for BG over 300. F/U with endo in 3 days (Monday).  .  Recent consult visits: 08/24/2020 - Endo (Dr Loanne Drilling) - f/u diabetes; Increased Rybelsus form $RemoveBefor'3mg'ehDvWPfOBiuK$  daily to $Remove'7mg'zpWeQVS$  daily. 08/19/2020 - Cardio (Dr Johnsie Cancel) Initial evaluation of CP. No med changes  Hospital visits: 08/04/2020 - ED Visit for Chest pain; Determined symptoms were atypical fro ACS; No med changes   Objective:  Lab Results  Component Value Date   CREATININE 0.94 09/06/2020   CREATININE 0.96 08/04/2020   CREATININE 0.91 06/07/2020    Lab Results  Component Value Date   HGBA1C 7.6 (A) 08/24/2020   Last diabetic Eye exam:  Lab Results  Component Value Date/Time   HMDIABEYEEXA  Retinopathy (A) 06/17/2018 12:00 AM    Last diabetic Foot exam:  Lab Results  Component Value Date/Time   HMDIABFOOTEX normal 06/06/2010 12:00 AM        Component Value Date/Time   CHOL 110 09/06/2020 0746   TRIG 128.0 09/06/2020 0746   TRIG 216 (HH) 04/24/2006 0741   HDL 38.20 (L) 09/06/2020 0746   CHOLHDL 3 09/06/2020 0746   VLDL 25.6 09/06/2020 0746   LDLCALC 46 09/06/2020 0746   LDLDIRECT 87.0 06/07/2020 0838    Hepatic Function Latest Ref Rng & Units 09/06/2020 08/04/2020 06/07/2020  Total Protein 6.0 - 8.3 g/dL 7.1 7.5 7.4  Albumin 3.5 - 5.2 g/dL 4.5 4.2 4.6  AST 0 - 37 U/L $Remo'20 28 14  'mnMIw$ ALT 0 - 35 U/L $Remo'27 31 19  'jYnuE$ Alk Phosphatase 39 - 117 U/L 49 60 106  Total Bilirubin 0.2 - 1.2 mg/dL 0.4 0.2(L) 0.4  Bilirubin, Direct 0.0 - 0.3 mg/dL - - -    Lab Results  Component Value Date/Time   TSH 6.51 (H) 08/24/2020 07:56 AM   TSH 5.40 (H) 04/22/2020 08:12 AM   FREET4 1.31 08/24/2020 07:56 AM   FREET4 1.28 04/22/2020 08:12 AM    CBC Latest Ref Rng & Units 08/04/2020 01/06/2020 09/22/2019  WBC 4.0 - 10.5 K/uL 6.7 9.0 9.6  Hemoglobin 12.0 - 15.0 g/dL 12.5 12.9 12.3  Hematocrit 36.0 - 46.0 % 38.2 38.8 36.5  Platelets 150 - 400 K/uL 257 260.0 260.0    Lab Results  Component Value Date/Time   VD25OH 43 02/23/2010 09:05 PM   VD25OH 25 (L) 07/14/2009 09:49 PM  Clinical ASCVD: Yes  The ASCVD Risk score Mikey Bussing DC Jr., et al., 2013) failed to calculate for the following reasons:   The valid total cholesterol range is 130 to 320 mg/dL    Other: (CHADS2VASc if Afib, PHQ9 if depression, MMRC or CAT for COPD, ACT, DEXA)  Social History   Tobacco Use  Smoking Status Former Smoker  . Packs/day: 1.40  . Years: 49.00  . Pack years: 68.60  . Types: Cigarettes  . Quit date: 07/16/2015  . Years since quitting: 5.3  Smokeless Tobacco Never Used  Tobacco Comment   Continues to vape but states pod do not contain nicotine   BP Readings from Last 3 Encounters:  08/24/20 138/68  08/19/20  116/64  08/06/20 108/60   Pulse Readings from Last 3 Encounters:  08/24/20 74  08/19/20 91  08/06/20 91   Wt Readings from Last 3 Encounters:  08/31/20 160 lb (72.6 kg)  08/24/20 160 lb 12.8 oz (72.9 kg)  08/19/20 161 lb (73 kg)    Assessment: Review of patient past medical history, allergies, medications, health status, including review of consultants reports, laboratory and other test data, was performed as part of comprehensive evaluation and provision of chronic care management services.   SDOH:  (Social Determinants of Health) assessments and interventions performed:  SDOH Interventions   Flowsheet Row Most Recent Value  SDOH Interventions   Physical Activity Interventions Other (Comments)  [discussed weekly goal of 150 minutes of moderate intensity exercise,  patient increase frequency of axercise.]      CCM Care Plan  Allergies  Allergen Reactions  . Codeine Nausea Only    Severe nausea    Medications Reviewed Today    Reviewed by Cherre Robins, PharmD (Pharmacist) on 11/10/20 at 19  Med List Status: <None>  Medication Order Taking? Sig Documenting Provider Last Dose Status Informant  acarbose (PRECOSE) 50 MG tablet [id number] Yes Take 1 tablet (50 mg total) by mouth 3 (three) times daily with meals. Renato Shin, MD Taking Active   amitriptyline (ELAVIL) 25 MG tablet [id number] Yes TAKE 2 TABLETS(50 MG) BY MOUTH DAILY Alyssa Held, DO Taking Active   aspirin EC 81 MG tablet [id number] Yes Take 81 mg by mouth daily. Alyssa Held, DO Taking Active Self  azelastine (ASTELIN) 0.1 % nasal spray [id number] Yes Place 1 spray into both nostrils 2 (two) times daily. Per ENT [provider] Taking Active   BIOTIN PO [id number] Yes Take 1 tablet by mouth daily.  [provider] Taking Active Self  bromocriptine (PARLODEL) 2.5 MG tablet [id number] Yes TAKE 1 TABLET(2.5 MG) BY MOUTH DAILY Renato Shin, MD Taking Active   Coenzyme Q10 (CO Q 10 PO)  [id number] Yes Take 1 capsule by mouth daily. [provider] Taking Active   COVID-19 mRNA vaccine, Pfizer, 30 MCG/0.3ML injection [id number]  INJECT AS DIRECTED Carlyle Basques, MD  Active   diazepam (VALIUM) 5 MG tablet [id number] Yes TAKE 1 TABLET BY MOUTH EVERY NIGHT AT BEDTIME AS NEEDED Alyssa Held, DO Taking Active   fenofibrate 160 MG tablet [id number] Yes TAKE 1 TABLET(160 MG) BY MOUTH DAILY Alyssa Held, DO Taking Active   Ferrous Sulfate (IRON SUPPLEMENT PO) [id number] Yes Take 1 tablet by mouth 2 (two) times daily. $RemoveBefo'65mg'GhSqipxMQnU$  [provider] Taking Active   fluticasone (FLONASE) 50 MCG/ACT nasal spray [id number] No Place 2 sprays into both nostrils daily.  Patient not taking: Reported on 11/10/2020   Carollee Herter, Alferd Apa,  DO Not Taking Active   latanoprost (XALATAN) 0.005 % ophthalmic solution [id number] Yes Place 1 drop into both eyes at bedtime.  [provider] Taking Active            Med Note Marikay Alar Aug 19, 2020  3:42 PM)    lisinopril (ZESTRIL) 5 MG tablet [id number] Yes TAKE 1 TABLET(5 MG) BY MOUTH DAILY Carollee Herter, Alferd Apa, DO Taking Active   metFORMIN (GLUCOPHAGE-XR) 500 MG 24 hr tablet [id number] Yes TAKE 4 TABLETS(2000 MG) BY MOUTH DAILY WITH Paticia Stack, MD Taking Active   Naproxen Sodium (ALEVE PO) [id number] Yes Take 2 tablets by mouth daily.  [provider] Taking Active Self  omeprazole (PRILOSEC) 40 MG capsule [id number] Yes Take 1 capsule (40 mg total) by mouth daily. Carollee Herter, Kendrick Fries R, DO Taking Active   repaglinide (PRANDIN) 2 MG tablet [id number] Yes TAKE 1 TABLET(2 MG) BY MOUTH THREE TIMES DAILY BEFORE MEALS Renato Shin, MD Taking Active   rosuvastatin (CRESTOR) 40 MG tablet [id number] Yes TAKE 1 TABLET(40 MG) BY MOUTH AT BEDTIME Carollee Herter, Alferd Apa, DO Taking Active   Semaglutide (RYBELSUS) 7 MG TABS [id number] Yes Take 7 mg by mouth daily. Renato Shin, MD Taking Active           Patient  Active Problem List   Diagnosis Date Noted  . Hypothyroidism 08/24/2020  . Chest pain 08/06/2020  . Trichimoniasis 04/27/2020  . Cervical cancer screening 04/27/2020  . Left thyroid nodule 04/13/2020  . Vaginal odor 01/11/2020  . Icterus 01/11/2020  . Uncontrolled type 2 diabetes mellitus with hyperglycemia ([place]) 09/22/2019  . De Quervain's disease (tenosynovitis) 09/22/2019  . Neck mass 07/11/2018  . Abnormal WBC count 05/29/2017  . Skin fissure 09/24/2015  . Allergic rhinitis 03/26/2014  . Wheezing 03/26/2014  . DM (diabetes mellitus) type II uncontrolled, periph vascular disorder ([place]) 08/25/2011  . Headache(784.0) 08/21/2011  . Acute sinusitis 04/26/2010  . URI 04/26/2010  . UNSPECIFIED CATARACT 02/23/2010  . POSTMENOPAUSAL STATUS 12/12/2007  . FATIGUE 09/11/2007  . THYROID NODULE 11/22/2006  . Diabetes ([place]) 11/22/2006  . Hyperlipidemia LDL goal <70 11/22/2006  . GLUCOMA 11/22/2006  . Essential hypertension 11/22/2006  . GERD 11/22/2006    Immunization History  Administered Date(s) Administered  . Fluad Quad(high Dose 65+) 04/08/2019, 04/08/2020  . Influenza Split 04/13/2011  . Influenza Whole 04/17/2006  . Influenza, High Dose Seasonal PF 04/12/2016, 05/07/2017, 04/08/2018  . Influenza, Seasonal, Injecte, Preservative Fre 05/22/2012  . Influenza,inj,Quad PF,6+ Mos 03/03/2014, 03/18/2015  . Influenza-Unspecified 05/07/2017  . PFIZER(Purple Top)SARS-COV-2 Vaccination 07/21/2019, 08/11/2019, 04/27/2020  . Pneumococcal Conjugate-13 09/13/2015, 05/07/2017, 04/15/2019  . Pneumococcal Polysaccharide-23 03/31/2003, 11/03/2008, 05/07/2017  . Td 10/24/1999, 02/23/2010  . Tdap 02/21/2019  . Zoster 12/25/2011  . Zoster Recombinat (Shingrix) 03/09/2017, 06/23/2017    Conditions to be addressed/monitored: CAD, HTN, HLD, DMII, Anxiety and PVD; nerve / jaw pain; insomnia; thyroid nodule; elevated TSH; glaucoma;  Care Plan : General Pharmacy (Adult)  Updates made by Cherre Robins, PHARMD since 11/10/2020 12:00 AM    Problem: Medication management and chronic disease management, support, education and care coordination for diabetes, HTN, HDL, nerve pain, anxiety, glaucoma, insomnia and GERD   Priority: High  Onset Date: 11/10/2020  Note:   Current Barriers:  . Unable to maintain control of type 2 DM . Difficulty sleeping not improved with current therpay . Chronic Disease Management support, education, and care coordination needed in related to Diabetes, Hypertension, Hyperlipidemia, Nerve Pain, Anxiety,  Glaucoma, GERD, Insomnia  Pharmacist Clinical Goal(s):  Marland Kitchen Over the next 90 days, patient will verbalize ability to afford treatment regimen . achieve control of diabetes as evidenced by A1c < 7.0% . maintain control of hyperlipidemia and HTN as evidenced by maintaining goals listed below  . Improve sleep duration and quality  through collaboration with PharmD and provider.   Interventions: . 1:1 collaboration with Carollee Herter, Alferd Apa, DO regarding development and update of comprehensive plan of care as evidenced by provider attestation and co-signature . Inter-disciplinary care team collaboration (see longitudinal plan of care) . Comprehensive medication review performed; medication list updated in electronic medical record  Hypertension . Controlled; BP goal <140/90 . Current regimen:  o Lisinopril $RemoveBefo'5mg'adywcPkdBUq$  daily in morning . Interventions: o Check blood pressure weekly, document, and provide at future appointments o Ensure daily salt intake < 2300 mg/day  Hyperlipidemia . Controlled; LDL goal < 100 and Triglycerides <150 . Current regimen:  o Rosuvastatin $RemoveBefore'40mg'uAGNOFHEriTsY$  daily (for LDL / 'bad' cholesterol) o Fenofibrate $RemoveBefor'160mg'nFJcOwHKVJnG$  dialy (for triglycerides) o Coenzyne Q10 $RemoveBefo'200mg'bgTvlzHPOoi$  daily . Interventions: o Reviewed goals for triglycerides and LDL. o Maintain current cholesterol medication regimen.   Diabetes . Uncontrolled;  A1c goal <7% but last A1c increased to 7.6%  (08/24/2020) . Patient thinks A1c increase related to oral and injected steroids used to treat respirator illness in early 2022.  Marland Kitchen Recent home BG readings have been improving o Checking 3 to 4 times per day o Ranging from 110 to 180 . Denies hypoglycemia symptoms . Current regimen:   Acarbose $RemoveB'50mg'HjElAIbw$  three times a day with first bite of each meal  Metformin XR 500 - take four tablets with morning meal  Repaglinide $RemoveBefo'2mg'aCQUSrOECJV$  - take 2 tablets 30 minutes before each meal  Bromocriptine 2.$RemoveBeforeDE'5mg'NDtVDqsNpfNOeOe$  daily  Rybelsus $RemoveB'7mg'NpTrtlHW$  - take daily 30 minutes before morning meal . Interventions;  o Reviewed appropriate timing of medication administration to get best BG lowering effects o Continue to check blood sugar 3-4 times daily, document, and provide at future appointments o Suspect patient will enter Medicare coverage gap in 2022 with addition of Rybelsus. Mailed PAP application for there to reviewed and complete  o Increase exercise frequency to achieve goal of 150 minutes per week of moderate intensity exercise (like walking)  GERD (goal -  reduce symptoms of GERD) . Controlled . Denies breakthrough GERD symptoms except when she occasionally forgets to take omeprazole . Current regimen:  o Omeprazole $RemoveBefo'40mg'aAzuyJUgBgA$  daily . Patient self care activities - Over the next 90 days, patient will: o Maintain current medication regimen for acid relfux  Thyroid Health:  Lab Results  Component Value Date   TSH 6.51 (H) 08/24/2020 .  Has appointment next week for labs to re-evaluate thyroid function . Current regimen:  o none . Interventions: o Patient advised to hold Biotin started today 11/10/2020 prior to upcoming recheck of TSH sine Biotin can interfere with thyroid testing  o Reminded to have labs recheck as planned o Follow up with Dr Loanne Drilling as planned.   Insomnia / Jaw Pain:  . Current regimen:  o Amitriptyline $RemoveBeforeD'25mg'nAJextRARHxRgJ$  - take 2 tablets = $RemoveB'50mg'RpuVQNXY$  at bedtime o Diazepam $RemoveBe'5mg'TKzYAZxCc$  take at bedtime as needed for sleep (fill #30  tablet prescription about 2 times per year) o Melatonin $RemoveBef'5mg'woCwfghlgm$  - take 2 tablets at bedtime as needed.  . Interventions: o Will consult with primary care physician regarding treatments for sleep o Dicussed that low thyroid hormone might affect sleep so might need to hold off  on additional medication until after upcoming labs are available. o Recommended continue current medication regimen   Health Maintenance:  . Patient states she was exposed to [place] 11/05/20 through contact with there grand daughter o Patient took home test 11/06/20 and today 11/10/20 and both were negative o Denies symptoms of COVID . Patient quit smoking a few years ago but continues to vape, thought she states that the pods she uses do not contain nicotine.  . She is due to have COVID booster (1st booster was 11/3/201) . Interventions o Sent message to physician on call today regarding further quarantining and testing for COVID. Recommended patient can stop quarentine but should continue to mask for 5 more day. o Discussed that although the vapes she uses do not contain nicotine, that we do not know the long term effects of vaping on health. Recommended she discontinue vaping.  o Reminded to consider getting COVID booster #2   Medication management . Current pharmacy: [facility] . Interventions o Comprehensive medication review performed. o Continue current medication management strategy o Reviewed medication refill history to assess adherence  Patient Goals/Self-Care Activities . Over the next 90 days, patient will:  take medications as prescribed, check glucose 3 to 4 times a day, document, and provide at future appointments, and collaborate with provider on medication access solutions  Follow Up Plan: Telephone follow up appointment with care management team member scheduled for:  180 day, The patient has been provided with contact information for the care management team and has been advised to call with any health  related questions or concerns. , and The care management team will reach out to the patient again over the next 90 days.         Medication Assistance: mailing application for Rybelsus PAP  Patient's preferred pharmacy is:  [facility] [place], [place] [address] [place] [address] Phone: [phone] Fax: [phone]   Follow Up:  Patient agrees to Care Plan and Follow-up.  Plan: Telephone follow up appointment with care management team member scheduled for:  3 to 6 months, The patient has been provided with contact information for the care management team and has been advised to call with any health related questions or concerns.  and The care management team will reach out to the patient again over the next 90 days days.  Cherre Robins, PharmD Clinical Pharmacist [facility]

## 2020-11-10 NOTE — Telephone Encounter (Signed)
Patient was exposed to [place] through her granddaughter on 11/05/20. Patient took a home test 11/06/20 and today 11/10/20 and both were negative.  She reports she has had no symptoms.  Do you recommend any further quarantine or testing?

## 2020-11-10 NOTE — Patient Instructions (Signed)
Alyssa Ford, It was a pleasure speaking with you today. Please feel free to contact me if you have any questions or concerns. Below is information regarding you health goals.   Cherre Robins, PharmD Clinical Pharmacist [facility] [phone]  Visit Information  PATIENT GOALS: Goals Addressed            This Visit's Progress   . Chronic Care Management Pharmacy Care Plan   On track    CARE PLAN ENTRY (see longitudinal plan of care for additional care plan information)  Current Barriers:  . Chronic Disease Management support, education, and care coordination needs related to Diabetes, Hypertension, Hyperlipidemia, Nerve Pain, Anxiety, Glaucoma, GERD, Insomnia   Hypertension BP Readings from Last 3 Encounters:  08/24/20 138/68  08/19/20 116/64  08/06/20 108/60   . Pharmacist Clinical Goal(s): o Over the next 90 days, patient will work with PharmD and providers to maintain BP goal <140/90 . Current regimen:  o Lisinopril 5mg  daily in morning . Patient self care activities - Over the next 90 days, patient will: o Check blood pressure weekly, document, and provide at future appointments o Ensure daily salt intake < 2300 mg/day  Hyperlipidemia Lipid Panel     Component Value Date/Time   CHOL 110 09/06/2020 0746   TRIG 128.0 09/06/2020 0746   HDL 38.20 (L) 09/06/2020 0746   CHOLHDL 3 09/06/2020 0746   VLDL 25.6 09/06/2020 0746   LDLCALC 46 09/06/2020 0746   . Pharmacist Clinical Goal(s): o Over the next 90 days, patient will work with PharmD and providers to maintain LDL goal < 100 and Triglycerides <150 . Current regimen:  o Rosuvastatin 40mg  daily (for LDL / 'bad' cholesterol) o Fenofibrate 160mg  dialy (for triglycerides) o Coenzyne Q10 200mg  daily . Interventions: o Reviewed goals for triglycerides and LDL. o Maintain current cholesterol medication regimen.   Diabetes Lab Results  Component Value Date/Time   HGBA1C 7.6 (A)  08/24/2020 07:45 AM   HGBA1C 6.7 (A) 04/22/2020 08:09 AM   HGBA1C 6.9 (H) 09/22/2019 10:40 AM   . Pharmacist Clinical Goal(s): o Over the next 90 days, patient will work with PharmD and providers to maintain A1c goal <7% . Current regimen:   Acarbose 50mg  three times a day with first bite of each meal  Metformin XR 500 - take four tablets with morning meal  Repaglinide 2mg  - take 2 tablets 30 minutes before each meal  Bromocriptine 2.5mg  daily  Rybelsus 7mg  - take daily 30 minutes before morning meal . Patient self care activities - Over the next 90 days, patient will: o Check blood sugar 3-4 times daily, document, and provide at future appointments o Contact provider with any episodes of hypoglycemia o Reviewed and complete patient assistance application for Rybelsus. o Increase exerciser frequency to achieve goal of 150 minutes per week of moderate intensity exercise (like walking)  GERD . Pharmacist Clinical Goal(s) o Over the next 90 days, patient will work with PharmD and providers to reduce symptoms of GERD . Current regimen:  o Omeprazole 40mg  daily . Patient self care activities - Over the next 90 days, patient will: o Maintain current medication regimen acid relfux  Thyroid Health:  Lab Results  Component Value Date   TSH 6.51 (H) 08/24/2020 .  Pharmacist Clinical Goal(s) o Over the next 90 days, patient will work with PharmD and providers to re-evaluate thyroid function . Current regimen:  o none . Interventions: o Patient advised to hold Biotin started today  11/10/2020 prior to upcoming recheck of TSH sine Biotin can interfere with thyroid testing  . Patient self care activities - Over the next 90 days, patient will: o Have labs recheck as planned o Do not take Biotin until after thyroid testing complete o Follow up with Dr Loanne Drilling as planned.   Insomnia / Jaw Pain:  . Pharmacist Clinical Goal(s) o Over the next 90 days, patient will work with PharmD and  providers to improve sleep and control jaw pain . Current regimen:  o Amitriptyline 25mg  - take 2 tablets = 50mg  at bedtime o Diazepam 5mg  take at bedtime as needed for sleep (fill #30 tablet prescription about 2 times per year) o Melatonin 5mg  - take 2 tablets at bedtime as needed.  . Interventions: o Will consult with primary care physician regarding treatments for sleep o Dicussed that low thyroid hormone might affect sleep so might need to hold off on additional medication until after upcoming labs are available.  . Patient self care activities - Over the next 90 days, patient will: o Continue current medication regimen o Have thyroid function rechecked as planned next week.  o Follow up with Dr Loanne Drilling as planned.   Medication management . Pharmacist Clinical Goal(s): o Over the next 90 days, patient will work with PharmD and providers to maintain optimal medication adherence . Current pharmacy: [facility] . Interventions o Comprehensive medication review performed. o Continue current medication management strategy . Patient self care activities - Over the next 90 days, patient will: o Focus on medication adherence by filling medications appropriately  o Take medications as prescribed o Report any questions or concerns to PharmD and/or provider(s)  Please see past updates related to this goal by clicking on the "Past Updates" button in the selected goal         The patient verbalized understanding of instructions, educational materials, and care plan provided today and agreed to receive a mailed copy of patient instructions, educational materials, and care plan.   Telephone follow up appointment with care management team member scheduled for: 3 to 6 months

## 2020-11-16 ENCOUNTER — Telehealth: Payer: Self-pay | Admitting: Family Medicine

## 2020-11-16 NOTE — Telephone Encounter (Signed)
Pt dropped off document for Pharmacist to fill out (Tammy) (document in a large yellow envelope)Document put at front office tray under pharmacist name.

## 2020-11-17 ENCOUNTER — Other Ambulatory Visit: Payer: Self-pay

## 2020-11-17 ENCOUNTER — Ambulatory Visit: Payer: Medicare Other | Admitting: Endocrinology

## 2020-11-17 VITALS — BP 110/60 | HR 72 | Ht 66.0 in | Wt 152.8 lb

## 2020-11-17 DIAGNOSIS — E1142 Type 2 diabetes mellitus with diabetic polyneuropathy: Secondary | ICD-10-CM

## 2020-11-17 LAB — POCT GLYCOSYLATED HEMOGLOBIN (HGB A1C): Hemoglobin A1C: 6.5 % — AB (ref 4.0–5.6)

## 2020-11-17 MED ORDER — LEVOTHYROXINE SODIUM 25 MCG PO TABS: 25.0000 ug | ORAL_TABLET | Freq: Every day | ORAL | 3 refills | Status: DC

## 2020-11-17 NOTE — Patient Instructions (Addendum)
I have sent a prescription to your pharmacy, for the thyroid pill.   Please continue the same 5 diabetes medications.  check your blood sugar once a day.  vary the time of day when you check, between before the 3 meals, and at bedtime.  also check if you have symptoms of your blood sugar being too high or too low.  please keep a record of the readings and bring it to your next appointment here (or you can bring the meter itself).  You can write it on any piece of paper.  please call us sooner if your blood sugar goes below 70, or if you have a lot of readings over 200.  Please come back for a follow-up appointment in 4 months.

## 2020-11-17 NOTE — Progress Notes (Signed)
Subjective:    Patient ID: Alyssa Ford, female    DOB: 10-Sep-1950, 70 y.o.   MRN: [id number]  HPI Pt returns for f/u of diabetes mellitus:  DM type: 2 Dx'ed: 5573 Complications: PN and CAD (seen on CT).   Therapy: 5 oral meds GDM: never DKA: never Severe hypoglycemia: never Pancreatitis: never Pancreatic imaging: never Other: she has never been on insulin; SDOH: she wants generic meds, if possible.   Interval history: Pt states cbg's are in the 100's.  She takes meds as rx'ed.  pt states she feels well in general, except for heartburn Pt also has MNG ([place] in 2020 showed LLP nodule; CT 9/20 and [place] 2021 were unchanged; TSH is normal off rx).  Past Medical History:  Diagnosis Date  . Anemia   . Anxiety   . Arthritis   . Cataract   . Chronic headaches   . Depression   . Diabetes mellitus   . Diverticulosis of colon (without mention of hemorrhage) 2003, 2008   Colonoscopy  . GERD (gastroesophageal reflux disease)   . Glaucoma   . Hiatal hernia 2003   EGD   . Hyperlipidemia   . Hypertension   . Thyroid nodule     Past Surgical History:  Procedure Laterality Date  . CARPAL TUNNEL RELEASE  2006  . CARPAL TUNNEL RELEASE  12/24/2007  . CATARACT EXTRACTION, BILATERAL     [place]---  sept and oct 2018  . CERVICAL FUSION  3/08  . CHOLECYSTECTOMY    . FASCIECTOMY Left 08/01/2018   Procedure: FASCIECTOMY LEFT RING;  Surgeon: Daryll Brod, MD;  Location: [place];  Service: Orthopedics;  Laterality: Left;  UPPER ARM  . TRIGGER FINGER RELEASE Left 08/01/2018   Procedure: RELEASE TRIGGER FINGER/A-1 PULLEY LEFTRING;  Surgeon: Daryll Brod, MD;  Location: [place];  Service: Orthopedics;  Laterality: Left;    Social History   Socioeconomic History  . Marital status: Married    Spouse name: Not on file  . Number of children: Not on file  . Years of education: Not on file  . Highest education level: Not on file  Occupational History  . Occupation:  retired  Tobacco Use  . Smoking status: Former Smoker    Packs/day: 1.40    Years: 49.00    Pack years: 68.60    Types: Cigarettes    Quit date: 07/16/2015    Years since quitting: 5.3  . Smokeless tobacco: Never Used  . Tobacco comment: Continues to vape but states pod do not contain nicotine  Vaping Use  . Vaping Use: Every day  Substance and Sexual Activity  . Alcohol use: Yes    Alcohol/week: 0.0 standard drinks    Comment: once weekly; wine and beer  . Drug use: No  . Sexual activity: Yes    Partners: Male  Other Topics Concern  . Not on file  Social History Narrative   Exercise--  walk   Social Determinants of Health   Financial Resource Strain: Not on file  Food Insecurity: No Food Insecurity  . Worried About Charity fundraiser in the Last Year: Never true  . Ran Out of Food in the Last Year: Never true  Transportation Needs: No Transportation Needs  . Lack of Transportation (Medical): No  . Lack of Transportation (Non-Medical): No  Physical Activity: Insufficiently Active  . Days of Exercise per Week: 4 days  . Minutes of Exercise per Session: 20 min  Stress: Not  on file  Social Connections: Not on file  Intimate Partner Violence: Not on file    Current Outpatient Medications on File Prior to Visit  Medication Sig Dispense Refill  . acarbose (PRECOSE) 50 MG tablet Take 1 tablet (50 mg total) by mouth 3 (three) times daily with meals. 270 tablet 3  . amitriptyline (ELAVIL) 25 MG tablet TAKE 2 TABLETS(50 MG) BY MOUTH DAILY 180 tablet 1  . aspirin EC 81 MG tablet Take 81 mg by mouth daily.    Marland Kitchen azelastine (ASTELIN) 0.1 % nasal spray Place 1 spray into both nostrils 2 (two) times daily. Per ENT    . BIOTIN PO Take 1 tablet by mouth daily.     . bromocriptine (PARLODEL) 2.5 MG tablet TAKE 1 TABLET(2.5 MG) BY MOUTH DAILY 90 tablet 3  . Coenzyme Q10 (CO Q 10 PO) Take 1 capsule by mouth daily.    Marland Kitchen COVID-19 mRNA vaccine, Pfizer, 30 MCG/0.3ML injection INJECT AS  DIRECTED .3 mL 0  . diazepam (VALIUM) 5 MG tablet TAKE 1 TABLET BY MOUTH EVERY NIGHT AT BEDTIME AS NEEDED 30 tablet 0  . fenofibrate 160 MG tablet TAKE 1 TABLET(160 MG) BY MOUTH DAILY 30 tablet 2  . Ferrous Sulfate (IRON SUPPLEMENT PO) Take 1 tablet by mouth 2 (two) times daily. 65mg     . fluticasone (FLONASE) 50 MCG/ACT nasal spray Place 2 sprays into both nostrils daily. 16 g 6  . latanoprost (XALATAN) 0.005 % ophthalmic solution Place 1 drop into both eyes at bedtime.     Marland Kitchen lisinopril (ZESTRIL) 5 MG tablet TAKE 1 TABLET(5 MG) BY MOUTH DAILY 90 tablet 3  . metFORMIN (GLUCOPHAGE-XR) 500 MG 24 hr tablet TAKE 4 TABLETS(2000 MG) BY MOUTH DAILY WITH BREAKFAST 360 tablet 0  . Naproxen Sodium (ALEVE PO) Take 2 tablets by mouth daily.     Marland Kitchen omeprazole (PRILOSEC) 40 MG capsule Take 1 capsule (40 mg total) by mouth daily. 90 capsule 3  . repaglinide (PRANDIN) 2 MG tablet TAKE 1 TABLET(2 MG) BY MOUTH THREE TIMES DAILY BEFORE MEALS 270 tablet 0  . rosuvastatin (CRESTOR) 40 MG tablet TAKE 1 TABLET(40 MG) BY MOUTH AT BEDTIME 90 tablet 1  . Semaglutide (RYBELSUS) 7 MG TABS Take 7 mg by mouth daily. 90 tablet 3   No current facility-administered medications on file prior to visit.    Allergies  Allergen Reactions  . Codeine Nausea Only    Severe nausea    Family History  Problem Relation Age of Onset  . Dementia Mother   . Breast cancer Mother 8  . Kidney failure Mother 58       died from kidney failure from abx given to her for pneumonia  . Hyperlipidemia Mother   . Hypertension Mother   . Diabetes Mother   . Cancer Mother 55       breast  . Parkinsonism Father   . Diabetes Father 74  . Hypertension Sister   . Diabetes Brother   . Hypertension Brother   . Heart attack Brother   . Heart disease Cousin 71       mi  . Colon cancer Neg Hx     BP 110/60 (BP Location: Right Arm, Patient Position: Sitting, Cuff Size: Large)   Pulse 72   Ht 5\' 6"  (1.676 m)   Wt 152 lb 12.8 oz (69.3 kg)    SpO2 98%   BMI 24.66 kg/m    Review of Systems She denies hypoglycemia    Objective:  Physical Exam VITAL SIGNS:  See vs page GENERAL: no distress Pulses: dorsalis pedis intact bilat.   MSK: no deformity of the feet CV: no leg edema Skin:  no ulcer on the feet.  normal color and temp on the feet. Neuro: sensation is intact to touch on the feet.     Lab Results  Component Value Date   HGBA1C 6.5 (A) 11/17/2020    Lab Results  Component Value Date   TSH 6.51 (H) 08/24/2020       Assessment & Plan:  Hypothyroidism: We discussed.  She decides at try rx.  Type 2 DM: well-controlled  Patient Instructions  I have sent a prescription to your pharmacy, for the thyroid pill.   Please continue the same 5 diabetes medications.  check your blood sugar once a day.  vary the time of day when you check, between before the 3 meals, and at bedtime.  also check if you have symptoms of your blood sugar being too high or too low.  please keep a record of the readings and bring it to your next appointment here (or you can bring the meter itself).  You can write it on any piece of paper.  please call us sooner if your blood sugar goes below 70, or if you have a lot of readings over 200.  Please come back for a follow-up appointment in 4 months.

## 2020-11-18 ENCOUNTER — Ambulatory Visit: Payer: Medicare Other | Admitting: Pharmacist

## 2020-11-18 DIAGNOSIS — E039 Hypothyroidism, unspecified: Secondary | ICD-10-CM

## 2020-11-18 DIAGNOSIS — E118 Type 2 diabetes mellitus with unspecified complications: Secondary | ICD-10-CM

## 2020-11-18 NOTE — Chronic Care Management (AMB) (Signed)
Chronic Care Management Pharmacy Note  11/18/2020 Name:  Alyssa Ford MRN:  [id number] DOB:  [birth year]  Subjective: Alyssa Ford is an 70 y.o. year old female who is a primary patient of Ann Held, DO.  The CCM team was consulted for assistance with disease management and care coordination needs.    Collaboration with Endocrinology office, patient and patient assistance program for completion of PAP application for Rybelsus in response to provider referral for pharmacy case management and/or care coordination services.   Consent to Services:  The patient was given information about Chronic Care Management services, agreed to services, and gave verbal consent prior to initiation of services.  Please see initial visit note for detailed documentation.   Patient Care Team: Carollee Herter, Alferd Apa, DO as PCP - General Calvert Cantor, MD as Consulting Physician (Ophthalmology) Levy Sjogren, MD as Referring Physician (Dermatology) Ilda Foil, Sol Blazing, MD as Referring Physician (Pediatrics) Renato Shin, MD as Consulting Physician (Endocrinology) Cherre Robins, PharmD (Pharmacist)  Recent office visits: 08/06/2020 - PCP (Dr Etter Sjogren) - ER Follow-up - chest pain. Referred to cardio for workup of CP; Patient also mentioned BG elevated. Recommended take acarbose $RemoveBefor'100mg'IrqTkCjdopbY$  tid for BG over 300. F/U with endo in 3 days (Monday).  Recent consult visits: 11/17/2020 - Endo (Dr Loanne Drilling) added levothyroxine 15mcg daily; No change in meds for DM; A1c was 6.5% - much improved! 08/24/2020 - Endo (Dr Loanne Drilling) - f/u diabetes; Increased Rybelsus form $RemoveBefor'3mg'ZMWQwuXcRzDL$  daily to $Remove'7mg'XkIjKvi$  daily. 08/19/2020 - Cardio (Dr Johnsie Cancel) Initial evaluation of CP. No med changes  Hospital visits: 08/04/2020 - ED Visit for Chest pain; Determined symptoms were atypical fro ACS; No med changes  Objective:  Lab Results  Component Value Date   CREATININE 0.94 09/06/2020   CREATININE 0.96 08/04/2020    CREATININE 0.91 06/07/2020    Lab Results  Component Value Date   HGBA1C 6.5 (A) 11/17/2020   Last diabetic Eye exam:  Lab Results  Component Value Date/Time   HMDIABEYEEXA Retinopathy (A) 06/17/2018 12:00 AM    Last diabetic Foot exam:  Lab Results  Component Value Date/Time   HMDIABFOOTEX normal 06/06/2010 12:00 AM        Component Value Date/Time   CHOL 110 09/06/2020 0746   TRIG 128.0 09/06/2020 0746   TRIG 216 (HH) 04/24/2006 0741   HDL 38.20 (L) 09/06/2020 0746   CHOLHDL 3 09/06/2020 0746   VLDL 25.6 09/06/2020 0746   LDLCALC 46 09/06/2020 0746   LDLDIRECT 87.0 06/07/2020 0838    Hepatic Function Latest Ref Rng & Units 09/06/2020 08/04/2020 06/07/2020  Total Protein 6.0 - 8.3 g/dL 7.1 7.5 7.4  Albumin 3.5 - 5.2 g/dL 4.5 4.2 4.6  AST 0 - 37 U/L $Remo'20 28 14  'gQcGq$ ALT 0 - 35 U/L $Remo'27 31 19  'vplRF$ Alk Phosphatase 39 - 117 U/L 49 60 106  Total Bilirubin 0.2 - 1.2 mg/dL 0.4 0.2(L) 0.4  Bilirubin, Direct 0.0 - 0.3 mg/dL - - -    Lab Results  Component Value Date/Time   TSH 6.51 (H) 08/24/2020 07:56 AM   TSH 5.40 (H) 04/22/2020 08:12 AM   FREET4 1.31 08/24/2020 07:56 AM   FREET4 1.28 04/22/2020 08:12 AM    CBC Latest Ref Rng & Units 08/04/2020 01/06/2020 09/22/2019  WBC 4.0 - 10.5 K/uL 6.7 9.0 9.6  Hemoglobin 12.0 - 15.0 g/dL 12.5 12.9 12.3  Hematocrit 36.0 - 46.0 % 38.2 38.8 36.5  Platelets 150 - 400 K/uL 257 260.0 260.0  Lab Results  Component Value Date/Time   VD25OH 43 02/23/2010 09:05 PM   VD25OH 25 (L) 07/14/2009 09:49 PM    Clinical ASCVD: Yes  The ASCVD Risk score Mikey Bussing DC Jr., et al., 2013) failed to calculate for the following reasons:   The valid total cholesterol range is 130 to 320 mg/dL    Other: (CHADS2VASc if Afib, PHQ9 if depression, MMRC or CAT for COPD, ACT, DEXA)  Social History   Tobacco Use  Smoking Status Former Smoker  . Packs/day: 1.40  . Years: 49.00  . Pack years: 68.60  . Types: Cigarettes  . Quit date: 07/16/2015  . Years since  quitting: 5.3  Smokeless Tobacco Never Used  Tobacco Comment   Continues to vape but states pod do not contain nicotine   BP Readings from Last 3 Encounters:  11/17/20 110/60  08/24/20 138/68  08/19/20 116/64   Pulse Readings from Last 3 Encounters:  11/17/20 72  08/24/20 74  08/19/20 91   Wt Readings from Last 3 Encounters:  11/17/20 152 lb 12.8 oz (69.3 kg)  08/31/20 160 lb (72.6 kg)  08/24/20 160 lb 12.8 oz (72.9 kg)    Assessment: Review of patient past medical history, allergies, medications, health status, including review of consultants reports, laboratory and other test data, was performed as part of comprehensive evaluation and provision of chronic care management services.   SDOH:  (Social Determinants of Health) assessments and interventions performed:    CCM Care Plan  Allergies  Allergen Reactions  . Codeine Nausea Only    Severe nausea    Medications Reviewed Today    Reviewed by Cherre Robins, PharmD (Pharmacist) on 11/18/20 at [phone]  Med List Status: <None>  Medication Order Taking? Sig Documenting Provider Last Dose Status Informant  acarbose (PRECOSE) 50 MG tablet [id number] No Take 1 tablet (50 mg total) by mouth 3 (three) times daily with meals. Renato Shin, MD Taking Active   amitriptyline (ELAVIL) 25 MG tablet [id number] No TAKE 2 TABLETS(50 MG) BY MOUTH DAILY Ann Held, DO Taking Active   aspirin EC 81 MG tablet [id number] No Take 81 mg by mouth daily. Ann Held, DO Taking Active Self  azelastine (ASTELIN) 0.1 % nasal spray [id number] No Place 1 spray into both nostrils 2 (two) times daily. Per ENT [provider] Taking Active   BIOTIN PO [id number] No Take 1 tablet by mouth daily.  [provider] Taking Active Self  bromocriptine (PARLODEL) 2.5 MG tablet [id number] No TAKE 1 TABLET(2.5 MG) BY MOUTH DAILY Renato Shin, MD Taking Active   Coenzyme Q10 (CO Q 10 PO) [id number] No Take 1 capsule by mouth daily.  [provider] Taking Active   COVID-19 mRNA vaccine, Pfizer, 30 MCG/0.3ML injection [id number] No INJECT AS DIRECTED Carlyle Basques, MD Taking Active   diazepam (VALIUM) 5 MG tablet [id number] No TAKE 1 TABLET BY MOUTH EVERY NIGHT AT BEDTIME AS NEEDED Ann Held, DO Taking Active   fenofibrate 160 MG tablet [id number] No TAKE 1 TABLET(160 MG) BY MOUTH DAILY Roma Schanz R, DO Taking Active   Ferrous Sulfate (IRON SUPPLEMENT PO) [id number] No Take 1 tablet by mouth 2 (two) times daily. $RemoveBefo'65mg'uZztrtrzphd$  [provider] Taking Active   fluticasone (FLONASE) 50 MCG/ACT nasal spray [id number] No Place 2 sprays into both nostrils daily. Roma Schanz R, DO Taking Active   latanoprost (XALATAN) 0.005 % ophthalmic solution [id number] No Place 1 drop into both eyes at bedtime.  [provider] Taking Active            Med Note Marikay Alar Aug 19, 2020  3:42 PM)    levothyroxine (SYNTHROID) 25 MCG tablet [id number]  Take 1 tablet (25 mcg total) by mouth daily. Renato Shin, MD  Active   lisinopril (ZESTRIL) 5 MG tablet [id number] No TAKE 1 TABLET(5 MG) BY MOUTH DAILY Carollee Herter, Alferd Apa, DO Taking Active   metFORMIN (GLUCOPHAGE-XR) 500 MG 24 hr tablet [id number] No TAKE 4 TABLETS(2000 MG) BY MOUTH DAILY WITH Paticia Stack, MD Taking Active   Naproxen Sodium (ALEVE PO) [id number] No Take 2 tablets by mouth daily.  [provider] Taking Active Self  omeprazole (PRILOSEC) 40 MG capsule [id number] No Take 1 capsule (40 mg total) by mouth daily. Roma Schanz R, DO Taking Active   repaglinide (PRANDIN) 2 MG tablet [id number] No TAKE 1 TABLET(2 MG) BY MOUTH THREE TIMES DAILY BEFORE MEALS Renato Shin, MD Taking Active   rosuvastatin (CRESTOR) 40 MG tablet [id number] No TAKE 1 TABLET(40 MG) BY MOUTH AT BEDTIME Carollee Herter, Alferd Apa, DO Taking Active   Semaglutide (RYBELSUS) 7 MG TABS [id number] No Take 7 mg by mouth daily. Renato Shin, MD Taking  Active           Patient Active Problem List   Diagnosis Date Noted  . Hypothyroidism 08/24/2020  . Chest pain 08/06/2020  . Trichimoniasis 04/27/2020  . Cervical cancer screening 04/27/2020  . Left thyroid nodule 04/13/2020  . Vaginal odor 01/11/2020  . Icterus 01/11/2020  . Uncontrolled type 2 diabetes mellitus with hyperglycemia ([place]) 09/22/2019  . De Quervain's disease (tenosynovitis) 09/22/2019  . Neck mass 07/11/2018  . Abnormal WBC count 05/29/2017  . Skin fissure 09/24/2015  . Allergic rhinitis 03/26/2014  . Wheezing 03/26/2014  . DM (diabetes mellitus) type II uncontrolled, periph vascular disorder ([place]) 08/25/2011  . Headache(784.0) 08/21/2011  . Acute sinusitis 04/26/2010  . URI 04/26/2010  . UNSPECIFIED CATARACT 02/23/2010  . POSTMENOPAUSAL STATUS 12/12/2007  . FATIGUE 09/11/2007  . THYROID NODULE 11/22/2006  . Diabetes ([place]) 11/22/2006  . Hyperlipidemia LDL goal <70 11/22/2006  . GLUCOMA 11/22/2006  . Essential hypertension 11/22/2006  . GERD 11/22/2006    Immunization History  Administered Date(s) Administered  . Fluad Quad(high Dose 65+) 04/08/2019, 04/08/2020  . Influenza Split 04/13/2011  . Influenza Whole 04/17/2006  . Influenza, High Dose Seasonal PF 04/12/2016, 05/07/2017, 04/08/2018  . Influenza, Seasonal, Injecte, Preservative Fre 05/22/2012  . Influenza,inj,Quad PF,6+ Mos 03/03/2014, 03/18/2015  . Influenza-Unspecified 05/07/2017  . PFIZER(Purple Top)SARS-COV-2 Vaccination 07/21/2019, 08/11/2019, 04/27/2020  . Pneumococcal Conjugate-13 09/13/2015, 05/07/2017, 04/15/2019  . Pneumococcal Polysaccharide-23 03/31/2003, 11/03/2008, 05/07/2017  . Td 10/24/1999, 02/23/2010  . Tdap 02/21/2019  . Zoster 12/25/2011  . Zoster Recombinat (Shingrix) 03/09/2017, 06/23/2017    Conditions to be addressed/monitored: CAD, HTN and DMII  Care Plan : General Pharmacy (Adult)  Updates made by Cherre Robins, PHARMD since 11/18/2020 12:00 AM    Problem:  Medication management and chronic disease management, support, education and care coordination for diabetes, HTN, HDL, nerve pain, anxiety, glaucoma, insomnia and GERD   Priority: High  Onset Date: 11/10/2020  Note:   Current Barriers:  . Unable to maintain control of type 2 DM (A1c improved and at goal as of 11/17/20) . Difficulty sleeping not improved with current therapy . Chronic Disease Management support, education, and care coordination needed in related to Diabetes, Hypertension, Hyperlipidemia, Nerve Pain, Anxiety, Glaucoma, GERD, Insomnia  Pharmacist  Clinical Goal(s):  Marland Kitchen Over the next 90 days, patient will verbalize ability to afford treatment regimen . achieve control of diabetes as evidenced by A1c < 7.0% . maintain control of hyperlipidemia and HTN as evidenced by maintaining goals listed below  . Improve sleep duration and quality  through collaboration with PharmD and provider.   Interventions: . 1:1 collaboration with Carollee Herter, Alferd Apa, DO regarding development and update of comprehensive plan of care as evidenced by provider attestation and co-signature . Inter-disciplinary care team collaboration (see longitudinal plan of care) . Comprehensive medication review performed; medication list updated in electronic medical record  Hypertension . Controlled; BP goal <140/90 . Current regimen:  o Lisinopril $RemoveBefo'5mg'VhVsmPSpbrZ$  daily in morning . Interventions: o Check blood pressure weekly, document, and provide at future appointments o Ensure daily salt intake < 2300 mg/day  Hyperlipidemia . Controlled; LDL goal < 100 and Triglycerides <150 . Current regimen:  o Rosuvastatin $RemoveBefore'40mg'qVcLmlDtFWjgl$  daily (for LDL / 'bad' cholesterol) o Fenofibrate $RemoveBefor'160mg'AxyqsAWbNXhc$  dialy (for triglycerides) o Coenzyne Q10 $RemoveBefo'200mg'PqrSjHEIlor$  daily . Interventions: o Reviewed goals for triglycerides and LDL. o Maintain current cholesterol medication regimen.   Diabetes . As of 11/17/2020 at goal - A1c was 6.5%;  A1c goal <7%  . Recent home BG  readings have been improving o Checking 3 to 4 times per day o Ranging from 110 to 180 . Denies hypoglycemia symptoms . Current regimen:   Acarbose $RemoveB'50mg'NwiOWFXZ$  three times a day with first bite of each meal  Metformin XR 500 - take four tablets with morning meal  Repaglinide $RemoveBefo'2mg'ATSHdDgTQPg$  - take 2 tablets 30 minutes before each meal  Bromocriptine 2.$RemoveBeforeDE'5mg'LfcXxkMDefahUSx$  daily  Rybelsus $RemoveB'7mg'qSpRGZJs$  - take daily 30 minutes before morning meal . Interventions;  o Reviewed appropriate timing of medication administration to get best BG lowering effects o Continue to check blood sugar 3-4 times daily, document, and provide at future appointments o Suspect patient will enter Medicare coverage gap in 2022 with addition of Rybelsus. Completed PAP application and sent to endocrinologist for signaturefor there to reviewed and complete  o Increase exercise frequency to achieve goal of 150 minutes per week of moderate intensity exercise (like walking)  GERD (goal -  reduce symptoms of GERD) . Controlled . Denies breakthrough GERD symptoms except when she occasionally forgets to take omeprazole . Current regimen:  o Omeprazole $RemoveBefo'40mg'ZPWlhYktTGR$  daily . Patient self care activities - Over the next 90 days, patient will: o Maintain current medication regimen for acid relfux  Thyroid Health:  Lab Results  Component Value Date   TSH 6.51 (H) 08/24/2020 .  Dr Loanne Drilling has start thyroid supplementation 11/17/20 . Current regimen:  o Levothyroxine 32mcg daily - started 11/17/2020 . Interventions: o Patient advised to hold Biotin at least 1 week prior to any thyroid testing o Follow up with Dr Loanne Drilling as planned.   Insomnia / Jaw Pain:  . Current regimen:  o Amitriptyline $RemoveBeforeD'25mg'psqlvMhWItTqmm$  - take 2 tablets = $RemoveB'50mg'WcEgmMPi$  at bedtime o Diazepam $RemoveBe'5mg'RiFfRFtWo$  take at bedtime as needed for sleep (fill #30 tablet prescription about 2 times per year) o Melatonin $RemoveBef'5mg'JTyVeFoPSb$  - take 2 tablets at bedtime as needed.  . Interventions: o Will consult with primary care physician regarding treatments  for sleep o Dicussed that low thyroid hormone might affect sleep so might need to hold off on additional medication until after upcoming labs are available. o Recommended continue current medication regimen   Health Maintenance:  . Patient states she was exposed to [facility] 11/05/20 through contact with  there grand daughter o Patient took home test 11/06/20 and today 11/10/20 and both were negative o Denies symptoms of COVID . Patient quit smoking a few years ago but continues to vape, thought she states that the pods she uses do not contain nicotine.  . She is due to have COVID booster (1st booster was 11/3/201) . Interventions o Discussed that although the vapes she uses do not contain nicotine, that we do not know the long term effects of vaping on health. Recommended she discontinue vaping.  o Reminded to consider getting COVID booster #2   Medication management . Current pharmacy: [facility] . Interventions o Comprehensive medication review performed. o Continue current medication management strategy o Reviewed medication refill history to assess adherence  Patient Goals/Self-Care Activities . Over the next 90 days, patient will:  take medications as prescribed, check glucose 3 to 4 times a day, document, and provide at future appointments, and collaborate with provider on medication access solutions  Follow Up Plan: Telephone follow up appointment with care management team member scheduled for:  180 day, The patient has been provided with contact information for the care management team and has been advised to call with any health related questions or concerns. , and The care management team will reach out to the patient again over the next 90 days.         Medication Assistance: Application for Rybelsus / NovoNordisk  medication assistance program. in process.  Anticipated assistance start date 12/24/2020.  Application completed; Faxed to endo office for signature. Once returned will  fax to PAP.  Patient's preferred pharmacy is:  [facility] [place], [address] AT [address] [place] [address] Phone: [phone] Fax: [phone]   Follow Up:  Patient agrees to Care Plan and Follow-up.  Plan: The care management team will reach out to the patient again over the next 45 days. (to check on PAP application)  Cherre Robins, PharmD Clinical Pharmacist [facility] [phone]

## 2020-11-18 NOTE — Patient Instructions (Signed)
Visit Information  PATIENT GOALS: Goals Addressed            This Visit's Progress   . Chronic Care Management Pharmacy Care Plan       CARE PLAN ENTRY (see longitudinal plan of care for additional care plan information)  Current Barriers:  . Chronic Disease Management support, education, and care coordination needs related to Diabetes, Hypertension, Hyperlipidemia, Nerve Pain, Anxiety, Glaucoma, GERD, Insomnia   Hypertension BP Readings from Last 3 Encounters:  11/17/20 110/60  08/24/20 138/68  08/19/20 116/64   . Pharmacist Clinical Goal(s): o Over the next 90 days, patient will work with PharmD and providers to maintain BP goal <140/90 . Current regimen:  o Lisinopril 5mg  daily in morning . Patient self care activities - Over the next 90 days, patient will: o Check blood pressure weekly, document, and provide at future appointments o Ensure daily salt intake < 2300 mg/day  Hyperlipidemia Lipid Panel     Component Value Date/Time   CHOL 110 09/06/2020 0746   TRIG 128.0 09/06/2020 0746   HDL 38.20 (L) 09/06/2020 0746   CHOLHDL 3 09/06/2020 0746   VLDL 25.6 09/06/2020 0746   LDLCALC 46 09/06/2020 0746   . Pharmacist Clinical Goal(s): o Over the next 90 days, patient will work with PharmD and providers to maintain LDL goal < 100 and Triglycerides <150 . Current regimen:  o Rosuvastatin 40mg  daily (for LDL / 'bad' cholesterol) o Fenofibrate 160mg  dialy (for triglycerides) o Coenzyne Q10 200mg  daily . Interventions: o Reviewed goals for triglycerides and LDL. o Maintain current cholesterol medication regimen.   Diabetes Lab Results  Component Value Date   HGBA1C 6.5 (A) 11/17/2020   . Pharmacist Clinical Goal(s): o Over the next 90 days, patient will work with PharmD and providers to maintain A1c goal <7% . Current regimen:   Acarbose 50mg  three times a day with first bite of each meal  Metformin XR 500 - take four tablets with morning meal  Repaglinide  2mg  - take 2 tablets 30 minutes before each meal  Bromocriptine 2.5mg  daily  Rybelsus 7mg  - take daily 30 minutes before morning meal . Patient self care activities - Over the next 90 days, patient will: o Check blood sugar 3-4 times daily, document, and provide at future appointments o Contact provider with any episodes of hypoglycemia o Reviewed and complete patient assistance application for Rybelsus. (done - received from patient 11/17/2020) o Increase exercise frequency to achieve goal of 150 minutes per week of moderate intensity exercise (like walking)  GERD . Pharmacist Clinical Goal(s) o Over the next 90 days, patient will work with PharmD and providers to reduce symptoms of GERD . Current regimen:  o Omeprazole 40mg  daily . Patient self care activities - Over the next 90 days, patient will: o Maintain current medication regimen acid relfux  Thyroid Health:  Lab Results  Component Value Date   TSH 6.51 (H) 08/24/2020 .  Pharmacist Clinical Goal(s) o Over the next 90 days, patient will work with PharmD and providers to supplement thyroid hormone and improve sleep . Current regimen:  o Levotyroxine 25 mcg daily in morning - started 11/17/2020 . Interventions: o Patient advised to hold Biotin for 1 week prior to thyroid testing  . Patient self care activities - Over the next 90 days, patient will: o Do not take Biotin 1 week prior to thyroid testing o Follow up with Dr Loanne Drilling as planned.   Insomnia / Jaw Pain:  . Pharmacist Clinical  Goal(s) o Over the next 90 days, patient will work with PharmD and providers to improve sleep and control jaw pain . Current regimen:  o Amitriptyline 25mg  - take 2 tablets = 50mg  at bedtime o Diazepam 5mg  take at bedtime as needed for sleep (fill #30 tablet prescription about 2 times per year) o Melatonin 5mg  - take 2 tablets at bedtime as needed.  . Interventions: o Dicussed that low thyroid hormone might affect sleep so might need to hold  off on additional medication until after upcoming labs are available.  . Patient self care activities - Over the next 90 days, patient will: o Continue current medication regimen o Have thyroid function rechecked as planned next week.  o Follow up with Dr Loanne Drilling as planned.   Medication management . Pharmacist Clinical Goal(s): o Over the next 90 days, patient will work with PharmD and providers to maintain optimal medication adherence . Current pharmacy: [facility] . Interventions o Comprehensive medication review performed. o Continue current medication management strategy . Patient self care activities - Over the next 90 days, patient will: o Focus on medication adherence by filling medications appropriately  o Take medications as prescribed o Report any questions or concerns to PharmD and/or provider(s)  Please see past updates related to this goal by clicking on the "Past Updates" button in the selected goal         The patient verbalized understanding of instructions, educational materials, and care plan provided today and declined offer to receive copy of patient instructions, educational materials, and care plan.   The care management team will reach out to the patient again over the next 45 days.   Alyssa Ford, PharmD Clinical Pharmacist [facility] [phone]

## 2020-11-20 ENCOUNTER — Other Ambulatory Visit: Payer: Self-pay | Admitting: Family Medicine

## 2020-11-29 ENCOUNTER — Telehealth: Payer: Self-pay

## 2020-11-29 NOTE — Telephone Encounter (Signed)
Received fax from [employer] Patient Assistance Program requesting updated copy of PCP [employer] number.  PCP's License Certificate faxed to [phone]. Transmission accepted.

## 2020-12-20 ENCOUNTER — Telehealth: Payer: Self-pay | Admitting: Pharmacist

## 2020-12-20 ENCOUNTER — Other Ambulatory Visit: Payer: Self-pay | Admitting: Endocrinology

## 2020-12-20 ENCOUNTER — Other Ambulatory Visit: Payer: Self-pay | Admitting: Family Medicine

## 2020-12-20 DIAGNOSIS — K219 Gastro-esophageal reflux disease without esophagitis: Secondary | ICD-10-CM

## 2020-12-20 DIAGNOSIS — IMO0002 Reserved for concepts with insufficient information to code with codable children: Secondary | ICD-10-CM

## 2020-12-20 NOTE — Chronic Care Management (AMB) (Signed)
Chronic Care Management Pharmacy Assistant   Name: Alyssa Ford  MRN: [id number] DOB: 1951-04-21   Reason for Encounter: Disease State-Diabetes Mellitus    Recent office visits:  None noted  Recent consult visits:  None noted  Hospital visits:  Medication Reconciliation was completed by comparing discharge summary, patient's EMR and Pharmacy list, and upon discussion with patient.  Admitted to the hospital on 08/04/20 due to Atypical Chest pain. Discharge date was 08/04/20. Discharged from [place]?Medications Started at [facility] Discharge:?? -started none  Medication Changes at Hospital Discharge: -Changed none  Medications Discontinued at Hospital Discharge: -Stopped none  Medications that remain the same after Hospital Discharge:??  -All other medications will remain the same.    Medications: Outpatient Encounter Medications as of 12/20/2020  Medication Sig   acarbose (PRECOSE) 50 MG tablet Take 1 tablet (50 mg total) by mouth 3 (three) times daily with meals.   amitriptyline (ELAVIL) 25 MG tablet TAKE 2 TABLETS(50 MG) BY MOUTH DAILY   aspirin EC 81 MG tablet Take 81 mg by mouth daily.   azelastine (ASTELIN) 0.1 % nasal spray Place 1 spray into both nostrils 2 (two) times daily. Per ENT   BIOTIN PO Take 1 tablet by mouth daily.    bromocriptine (PARLODEL) 2.5 MG tablet TAKE 1 TABLET(2.5 MG) BY MOUTH DAILY   Coenzyme Q10 (CO Q 10 PO) Take 1 capsule by mouth daily.   COVID-19 mRNA vaccine, Pfizer, 30 MCG/0.3ML injection INJECT AS DIRECTED   diazepam (VALIUM) 5 MG tablet TAKE 1 TABLET BY MOUTH EVERY NIGHT AT BEDTIME AS NEEDED   fenofibrate 160 MG tablet TAKE 1 TABLET(160 MG) BY MOUTH DAILY   Ferrous Sulfate (IRON SUPPLEMENT PO) Take 1 tablet by mouth 2 (two) times daily. $RemoveBefo'65mg'QKVsuobCLvW$    fluticasone (FLONASE) 50 MCG/ACT nasal spray Place 2 sprays into both nostrils daily.   latanoprost (XALATAN) 0.005 % ophthalmic solution Place 1 drop into both  eyes at bedtime.    levothyroxine (SYNTHROID) 25 MCG tablet Take 1 tablet (25 mcg total) by mouth daily.   lisinopril (ZESTRIL) 5 MG tablet TAKE 1 TABLET(5 MG) BY MOUTH DAILY   metFORMIN (GLUCOPHAGE-XR) 500 MG 24 hr tablet TAKE 4 TABLETS(2000 MG) BY MOUTH DAILY WITH BREAKFAST   Naproxen Sodium (ALEVE PO) Take 2 tablets by mouth daily.    omeprazole (PRILOSEC) 40 MG capsule Take 1 capsule (40 mg total) by mouth daily.   repaglinide (PRANDIN) 2 MG tablet TAKE 1 TABLET(2 MG) BY MOUTH THREE TIMES DAILY BEFORE MEALS   rosuvastatin (CRESTOR) 40 MG tablet TAKE 1 TABLET(40 MG) BY MOUTH AT BEDTIME   Semaglutide (RYBELSUS) 7 MG TABS Take 7 mg by mouth daily.   No facility-administered encounter medications on file as of 12/20/2020.   Recent Relevant Labs: Lab Results  Component Value Date/Time   HGBA1C 6.5 (A) 11/17/2020 07:39 AM   HGBA1C 7.6 (A) 08/24/2020 07:45 AM   HGBA1C 6.9 (H) 09/22/2019 10:40 AM   HGBA1C 7.4 (H) 11/23/2016 10:04 AM   MICROALBUR 1.1 04/17/2019 08:48 AM   MICROALBUR 1.1 04/08/2018 03:24 PM    Kidney Function Lab Results  Component Value Date/Time   CREATININE 0.94 09/06/2020 07:46 AM   CREATININE 0.96 08/04/2020 09:47 AM   GFR 61.50 09/06/2020 07:46 AM   GFRNONAA >60 08/04/2020 09:47 AM   GFRAA  12/23/2007 01:55 PM    >60        The eGFR has been calculated using the MDRD equation. This  calculation has not been validated in all clinical    Current antihyperglycemic regimen:  Acarbose 50mg  three times a day with first bite of each meal Metformin XR 500 - take four tablets with morning meal Repaglinide 2mg  - take 2 tablets 30 minutes before each meal Bromocriptine 2.5mg  daily Rybelsus 7mg  - take daily 30 minutes before morning meal  What recent interventions/DTPs have been made to improve glycemic control:  None noted  Have there been any recent hospitalizations or ED visits since last visit with CPP? Yes  Patient reports hypoglycemic symptoms, including  Shaky  Patient denies hyperglycemic symptoms, including blurry vision, excessive thirst, fatigue, polyuria, and weakness  How often are you checking your blood sugar? once daily  What are your blood sugars ranging?  Fasting: 120's Before meals:  After meals: 164 Bedtime:    During the week, how often does your blood glucose drop below 70? Rarely  Are you checking your feet daily/regularly?  Patient states she checks her feet daily.  Adherence Review: Is the patient currently on a STATIN medication? Yes Is the patient currently on ACE/ARB medication? Yes Does the patient have >5 day gap between last estimated fill dates? No  Patient asked about status of Rybelsus application. [employer]. Patient has been approved. Patient will receive 4 boxes of rybelsus. Prescription will be sent to the providers office. 120 days supplies will be mailed.Patient notified.  Star Rating Drugs: Rosuvastatin 40 mg last filled 09/21/20 90 DS Repaglinide 2 mg last filled 09/21/20 90 DS Metformin 500 mg last filled 09/21/20 90 DS Lisinopril 5 mg last filled 09/22/20 90 DS  [facility] Clinical Pharmacist Assistant [phone]

## 2020-12-21 NOTE — Telephone Encounter (Signed)
Updated PAP information. Since patient's Rybelsus will be sent to provider that sign PCP application, it should go to Dr Cordelia Pen office.

## 2021-01-06 ENCOUNTER — Ambulatory Visit: Payer: Medicare Other

## 2021-01-06 ENCOUNTER — Ambulatory Visit: Payer: Medicare Other | Admitting: *Deleted

## 2021-01-06 ENCOUNTER — Telehealth: Payer: Self-pay | Admitting: Pharmacist

## 2021-01-06 NOTE — Chronic Care Management (AMB) (Signed)
    Chronic Care Management Pharmacy Assistant   Name: TRUDEE CHIRINO  MRN: [id number] DOB: Jul 05, 1950  Reason for Encounter: Patient Assistance Documentation  Medications: Outpatient Encounter Medications as of 01/06/2021  Medication Sig   acarbose (PRECOSE) 50 MG tablet TAKE 1 TABLET(50 MG) BY MOUTH THREE TIMES DAILY WITH MEALS   amitriptyline (ELAVIL) 25 MG tablet TAKE 2 TABLETS(50 MG) BY MOUTH DAILY   aspirin EC 81 MG tablet Take 81 mg by mouth daily.   azelastine (ASTELIN) 0.1 % nasal spray Place 1 spray into both nostrils 2 (two) times daily. Per ENT   BIOTIN PO Take 1 tablet by mouth daily.    bromocriptine (PARLODEL) 2.5 MG tablet TAKE 1 TABLET(2.5 MG) BY MOUTH DAILY   Coenzyme Q10 (CO Q 10 PO) Take 1 capsule by mouth daily.   COVID-19 mRNA vaccine, Pfizer, 30 MCG/0.3ML injection INJECT AS DIRECTED   diazepam (VALIUM) 5 MG tablet TAKE 1 TABLET BY MOUTH EVERY NIGHT AT BEDTIME AS NEEDED   fenofibrate 160 MG tablet TAKE 1 TABLET(160 MG) BY MOUTH DAILY   Ferrous Sulfate (IRON SUPPLEMENT PO) Take 1 tablet by mouth 2 (two) times daily. 65mg    fluticasone (FLONASE) 50 MCG/ACT nasal spray Place 2 sprays into both nostrils daily.   latanoprost (XALATAN) 0.005 % ophthalmic solution Place 1 drop into both eyes at bedtime.    levothyroxine (SYNTHROID) 25 MCG tablet Take 1 tablet (25 mcg total) by mouth daily.   lisinopril (ZESTRIL) 5 MG tablet TAKE 1 TABLET(5 MG) BY MOUTH DAILY   metFORMIN (GLUCOPHAGE-XR) 500 MG 24 hr tablet TAKE 4 TABLETS(2000 MG) BY MOUTH DAILY WITH BREAKFAST   Naproxen Sodium (ALEVE PO) Take 2 tablets by mouth daily.    omeprazole (PRILOSEC) 40 MG capsule TAKE 1 CAPSULE(40 MG) BY MOUTH DAILY   repaglinide (PRANDIN) 2 MG tablet TAKE 1 TABLET(2 MG) BY MOUTH THREE TIMES DAILY BEFORE MEALS   rosuvastatin (CRESTOR) 40 MG tablet TAKE 1 TABLET(40 MG) BY MOUTH AT BEDTIME   Semaglutide (RYBELSUS) 7 MG TABS Take 7 mg by mouth daily.   No facility-administered encounter  medications on file as of 01/06/2021.   I spoke with Freda Munro at [employer], patient assistance program for Rybelus, and patient was approved for Rybelus 12/20/20 with coverage until 05/25/21. Her first order was shipped 07/13 to be received in 7-10 business days.   Called and informed patient that her application was approved and she will receive an update when it is available for pick- up. Patient acknowledged understanding.  Wilford Sports CPA,CMA

## 2021-01-07 ENCOUNTER — Telehealth: Payer: Self-pay

## 2021-01-07 NOTE — Telephone Encounter (Signed)
Pt called. Notified that medication has arrived. Placed at the front.

## 2021-01-12 ENCOUNTER — Other Ambulatory Visit: Payer: Self-pay | Admitting: Family Medicine

## 2021-01-12 DIAGNOSIS — F411 Generalized anxiety disorder: Secondary | ICD-10-CM

## 2021-01-12 DIAGNOSIS — Z961 Presence of intraocular lens: Secondary | ICD-10-CM | POA: Diagnosis not present

## 2021-01-12 DIAGNOSIS — H401131 Primary open-angle glaucoma, bilateral, mild stage: Secondary | ICD-10-CM | POA: Diagnosis not present

## 2021-01-12 NOTE — Telephone Encounter (Signed)
Requesting: Valium Contract: 2019 UDS: 2019 Last OV: 08/06/20 Next OV: 02/03/21 Last Refill: 09/27/20, #30--0 RF Database:   Please advise

## 2021-01-18 ENCOUNTER — Ambulatory Visit (INDEPENDENT_AMBULATORY_CARE_PROVIDER_SITE_OTHER): Payer: Medicare Other

## 2021-01-18 ENCOUNTER — Other Ambulatory Visit: Payer: Self-pay

## 2021-01-18 VITALS — BP 138/80 | HR 67 | Temp 97.5°F | Resp 16 | Ht 66.0 in | Wt 154.4 lb

## 2021-01-18 DIAGNOSIS — Z1211 Encounter for screening for malignant neoplasm of colon: Secondary | ICD-10-CM

## 2021-01-18 DIAGNOSIS — Z0000 Encounter for general adult medical examination without abnormal findings: Secondary | ICD-10-CM

## 2021-01-18 NOTE — Progress Notes (Signed)
Subjective:   Alyssa Ford is a 70 y.o. female who presents for Medicare Annual (Subsequent) preventive examination.  Review of Systems     Cardiac Risk Factors include: advanced age (>27mn, >>57women);diabetes mellitus;dyslipidemia;hypertension     Objective:    Today's Vitals   01/18/21 0851  BP: 138/80  Pulse: 67  Resp: 16  Temp: (!) 97.5 F (36.4 C)  TempSrc: Temporal  SpO2: 97%  Weight: 154 lb 6.4 oz (70 kg)  Height: '5\' 6"'$  (1.676 m)   Body mass index is 24.92 kg/m.  Advanced Directives 01/18/2021 08/04/2020 01/06/2020 02/21/2019 08/01/2018 11/21/2017 06/20/2017  Does Patient Have a Medical Advance Directive? No No No No No No No  Would patient like information on creating a medical advance directive? Yes (MAU/Ambulatory/Procedural Areas - Information given) - Yes (MAU/Ambulatory/Procedural Areas - Information given) - Yes (MAU/Ambulatory/Procedural Areas - Information given) - -    Current Medications (verified) Outpatient Encounter Medications as of 01/18/2021  Medication Sig   acarbose (PRECOSE) 50 MG tablet TAKE 1 TABLET(50 MG) BY MOUTH THREE TIMES DAILY WITH MEALS   amitriptyline (ELAVIL) 25 MG tablet TAKE 2 TABLETS(50 MG) BY MOUTH DAILY   aspirin EC 81 MG tablet Take 81 mg by mouth daily.   azelastine (ASTELIN) 0.1 % nasal spray Place 1 spray into both nostrils 2 (two) times daily. Per ENT   BIOTIN PO Take 1 tablet by mouth daily.    bromocriptine (PARLODEL) 2.5 MG tablet TAKE 1 TABLET(2.5 MG) BY MOUTH DAILY   Coenzyme Q10 (CO Q 10 PO) Take 1 capsule by mouth daily.   COVID-19 mRNA vaccine, Pfizer, 30 MCG/0.3ML injection INJECT AS DIRECTED   diazepam (VALIUM) 5 MG tablet TAKE 1 TABLET BY MOUTH EVERY NIGHT AT BEDTIME AS NEEDED   fenofibrate 160 MG tablet TAKE 1 TABLET(160 MG) BY MOUTH DAILY   Ferrous Sulfate (IRON SUPPLEMENT PO) Take 1 tablet by mouth 2 (two) times daily. '65mg'$    latanoprost (XALATAN) 0.005 % ophthalmic solution Place 1 drop into both eyes at  bedtime.    levothyroxine (SYNTHROID) 25 MCG tablet Take 1 tablet (25 mcg total) by mouth daily.   lisinopril (ZESTRIL) 5 MG tablet TAKE 1 TABLET(5 MG) BY MOUTH DAILY   metFORMIN (GLUCOPHAGE-XR) 500 MG 24 hr tablet TAKE 4 TABLETS(2000 MG) BY MOUTH DAILY WITH BREAKFAST   Naproxen Sodium (ALEVE PO) Take 2 tablets by mouth daily.    omeprazole (PRILOSEC) 40 MG capsule TAKE 1 CAPSULE(40 MG) BY MOUTH DAILY   repaglinide (PRANDIN) 2 MG tablet TAKE 1 TABLET(2 MG) BY MOUTH THREE TIMES DAILY BEFORE MEALS   rosuvastatin (CRESTOR) 40 MG tablet TAKE 1 TABLET(40 MG) BY MOUTH AT BEDTIME   Semaglutide (RYBELSUS) 7 MG TABS Take 7 mg by mouth daily.   fluticasone (FLONASE) 50 MCG/ACT nasal spray Place 2 sprays into both nostrils daily. (Patient not taking: Reported on 01/18/2021)   No facility-administered encounter medications on file as of 01/18/2021.    Allergies (verified) Codeine   History: Past Medical History:  Diagnosis Date   Anemia    Anxiety    Arthritis    Cataract    Chronic headaches    Depression    Diabetes mellitus    Diverticulosis of colon (without mention of hemorrhage) 2003, 2008   Colonoscopy   GERD (gastroesophageal reflux disease)    Glaucoma    Hiatal hernia 2003   EGD    Hyperlipidemia    Hypertension    Thyroid nodule    Past Surgical  History:  Procedure Laterality Date   CARPAL TUNNEL RELEASE  2006   CARPAL TUNNEL RELEASE  12/24/2007   CATARACT EXTRACTION, BILATERAL     [place]---  sept and oct 2018   CERVICAL FUSION  3/08   CHOLECYSTECTOMY     FASCIECTOMY Left 08/01/2018   Procedure: FASCIECTOMY LEFT RING;  Surgeon: Daryll Brod, MD;  Location: [facility];  Service: Orthopedics;  Laterality: Left;  UPPER ARM   TRIGGER FINGER RELEASE Left 08/01/2018   Procedure: RELEASE TRIGGER FINGER/A-1 PULLEY [place];  Surgeon: Daryll Brod, MD;  Location: [facility];  Service: Orthopedics;  Laterality: Left;   Family History  Problem Relation Age  of Onset   Dementia Mother    Breast cancer Mother 32   Kidney failure Mother 41       died from kidney failure from abx given to her for pneumonia   Hyperlipidemia Mother    Hypertension Mother    Diabetes Mother    Cancer Mother 25       breast   Parkinsonism Father    Diabetes Father 97   Hypertension Sister    Diabetes Brother    Hypertension Brother    Heart attack Brother    Heart disease Cousin 46       mi   Colon cancer Neg Hx    Social History   Socioeconomic History   Marital status: Married    Spouse name: Not on file   Number of children: Not on file   Years of education: Not on file   Highest education level: Not on file  Occupational History   Occupation: retired  Tobacco Use   Smoking status: Former    Packs/day: 1.40    Years: 49.00    Pack years: 68.60    Types: Cigarettes    Quit date: 07/16/2015    Years since quitting: 5.5   Smokeless tobacco: Never   Tobacco comments:    Continues to vape but states pod do not contain nicotine  Vaping Use   Vaping Use: Every day  Substance and Sexual Activity   Alcohol use: Yes    Alcohol/week: 0.0 standard drinks    Comment: once weekly; wine and beer   Drug use: No   Sexual activity: Yes    Partners: Male  Other Topics Concern   Not on file  Social History Narrative   Exercise--  walk   Social Determinants of Health   Financial Resource Strain: Low Risk    Difficulty of Paying Living Expenses: Not hard at all  Food Insecurity: No Food Insecurity   Worried About Charity fundraiser in the Last Year: Never true   [place] in the Last Year: Never true  Transportation Needs: No Transportation Needs   Lack of Transportation (Medical): No   Lack of Transportation (Non-Medical): No  Physical Activity: Insufficiently Active   Days of Exercise per Week: 4 days   Minutes of Exercise per Session: 20 min  Stress: No Stress Concern Present   Feeling of Stress : Only a little  Social Connections:  Moderately Isolated   Frequency of Communication with Friends and Family: More than three times a week   Frequency of Social Gatherings with Friends and Family: More than three times a week   Attends Religious Services: Never   Marine scientist or Organizations: No   Attends Archivist Meetings: Never   Marital Status: Married    Tobacco Counseling  Counseling given: Not Answered Tobacco comments: Continues to vape but states pod do not contain nicotine   Clinical Intake:  Pre-visit preparation completed: Yes  Pain : No/denies pain     Nutritional Status: BMI of 19-24  Normal Nutritional Risks: None Diabetes: Yes CBG done?: No Did pt. bring in CBG monitor from home?: No  How often do you need to have someone help you when you read instructions, pamphlets, or other written materials from your doctor or pharmacy?: 1 - Never Diabetes:  Is the patient diabetic?  Yes  If diabetic, was a CBG obtained today?  No  Did the patient bring in their glucometer from home?  No  How often do you monitor your CBG's? 2-3 x per week.   Financial Strains and Diabetes Management:  Are you having any financial strains with the device, your supplies or your medication? No .  Does the patient want to be seen by Chronic Care Management for management of their diabetes?  No  Would the patient like to be referred to a Nutritionist or for Diabetic Management?  No   Diabetic Exams:  Diabetic Eye Exam: Completed 01/12/2021. Awaiting report from Dr. Eulas Post  Diabetic Foot Exam: Completed 11/17/2020.     Information entered by :: Caroleen Hamman LPN   Activities of Daily Living In your present state of health, do you have any difficulty performing the following activities: 01/18/2021  Hearing? N  Vision? N  Difficulty concentrating or making decisions? Y  Comment occasionally forgets a name  Walking or climbing stairs? N  Dressing or bathing? N  Doing errands, shopping? N   Preparing Food and eating ? N  Using the Toilet? N  In the past six months, have you accidently leaked urine? N  Do you have problems with loss of bowel control? N  Managing your Medications? N  Managing your Finances? N  Housekeeping or managing your Housekeeping? N  Some recent data might be hidden    Patient Care Team: Carollee Herter, Alferd Apa, DO as PCP - General Calvert Cantor, MD as Consulting Physician (Ophthalmology) Levy Sjogren, MD as Referring Physician (Dermatology) Ilda Foil, Sol Blazing, MD as Referring Physician (Pediatrics) Renato Shin, MD as Consulting Physician (Endocrinology) Cherre Robins, PharmD (Pharmacist)  Indicate any recent Medical Services you may have received from other than [facility] providers in the past year (date may be approximate).     Assessment:   This is a routine wellness examination for Shanielle.  Hearing/Vision screen Hearing Screening - Comments:: No issues Vision Screening - Comments:: Last eye exam-01/12/21-Dr. Eulas Post  Dietary issues and exercise activities discussed: Current Exercise Habits: Home exercise routine, Type of exercise: walking, Time (Minutes): 30, Frequency (Times/Week): 4, Weekly Exercise (Minutes/Week): 120, Intensity: Mild, Exercise limited by: None identified   Goals Addressed             This Visit's Progress    Begin walking at least 3x/week   On track      Depression Screen PHQ 2/9 Scores 01/18/2021 01/06/2020 03/05/2019 05/29/2017 12/14/2016 10/27/2016 04/28/2016  PHQ - 2 Score 0 0 0 0 0 0 0  PHQ- 9 Score - - 0 0 - - -    Fall Risk Fall Risk  01/18/2021 01/06/2020 03/05/2019 12/14/2016 10/27/2016  Falls in the past year? 0 1 1 Yes No  Number falls in past yr: 0 0 0 1 -  Injury with Fall? 0 1 1 No -  Follow up Falls prevention discussed Education provided;Falls prevention  discussed - Education provided;Falls prevention discussed -    FALL RISK PREVENTION PERTAINING TO THE HOME:  Any stairs in or around  the home? No  Home free of loose throw rugs in walkways, pet beds, electrical cords, etc? Yes  Adequate lighting in your home to reduce risk of falls? Yes   ASSISTIVE DEVICES UTILIZED TO PREVENT FALLS:  Life alert? No  Use of a cane, walker or w/c? No  Grab bars in the bathroom? Yes  Shower chair or bench in shower? No  Elevated toilet seat or a handicapped toilet? No   TIMED UP AND GO:  Was the test performed? Yes .  Length of time to ambulate 10 feet: 10 sec.   Gait steady and fast without use of assistive device  Cognitive Function:Normal cognitive status assessed by direct observation by this Nurse Health Advisor. No abnormalities found.   MMSE - Mini Mental State Exam 12/14/2016  Orientation to time 5  Orientation to Place 5  Registration 3  Attention/ Calculation 5  Recall 3  Language- name 2 objects 2  Language- repeat 1  Language- follow 3 step command 3  Language- read & follow direction 1  Write a sentence 1  Copy design 1  Total score 30     6CIT Screen 01/18/2021 01/06/2020  What Year? 0 points 0 points  What month? 0 points 0 points  What time? 0 points 0 points  Count back from 20 0 points 0 points  Months in reverse 0 points 0 points  Repeat phrase 0 points -  Total Score 0 -    Immunizations Immunization History  Administered Date(s) Administered   Fluad Quad(high Dose 65+) 04/08/2019, 04/08/2020   Influenza Split 04/13/2011   Influenza Whole 04/17/2006   Influenza, High Dose Seasonal PF 04/12/2016, 05/07/2017, 04/08/2018   Influenza, Seasonal, Injecte, Preservative Fre 05/22/2012   Influenza,inj,Quad PF,6+ Mos 03/03/2014, 03/18/2015   Influenza-Unspecified 05/07/2017   PFIZER(Purple Top)SARS-COV-2 Vaccination 07/21/2019, 08/11/2019, 04/27/2020, 11/23/2020   Pneumococcal Conjugate-13 09/13/2015, 05/07/2017, 04/15/2019   Pneumococcal Polysaccharide-23 03/31/2003, 11/03/2008, 05/07/2017   Td 10/24/1999, 02/23/2010   Tdap 02/21/2019   Zoster  Recombinat (Shingrix) 03/09/2017, 06/23/2017   Zoster, Live 12/25/2011    TDAP status: Up to date  Flu Vaccine status: Up to date  Pneumococcal vaccine status: Up to date  Covid-19 vaccine status: Completed vaccines  Qualifies for Shingles Vaccine? No   Zostavax completed Yes   Shingrix Completed?: Yes  Screening Tests Health Maintenance  Topic Date Due   OPHTHALMOLOGY EXAM  04/08/2020   PNA vac Low Risk Adult (2 of 2 - PPSV23) 04/14/2020   COLONOSCOPY (Pts 45-44yr Insurance coverage will need to be confirmed)  11/21/2020   INFLUENZA VACCINE  01/24/2021   COVID-19 Vaccine (5 - Booster for Pfizer series) 03/25/2021   HEMOGLOBIN A1C  05/20/2021   DEXA SCAN  08/04/2021   MAMMOGRAM  08/12/2021   FOOT EXAM  11/17/2021   TETANUS/TDAP  02/20/2029   Hepatitis C Screening  Completed   Zoster Vaccines- Shingrix  Completed   HPV VACCINES  Aged Out    Health Maintenance  Health Maintenance Due  Topic Date Due   OPHTHALMOLOGY EXAM  04/08/2020   PNA vac Low Risk Adult (2 of 2 - PPSV23) 04/14/2020   COLONOSCOPY (Pts 45-428yrInsurance coverage will need to be confirmed)  11/21/2020    Colorectal cancer screening: Referral to GI placed today. Pt aware the office will call re: appt.  Mammogram status: Completed Bilateral 08/12/2020. Repeat every  year  Bone Density status: Completed 08/05/2019. Results reflect: Bone density results: NORMAL. Repeat every 2 years.  Lung Cancer Screening: (Low Dose CT Chest recommended if Age 63-80 years, 30 pack-year currently smoking OR have quit w/in 15years.) does not qualify.   Lung Cancer Screening: Completed 06/04/2020  Additional Screening:  Hepatitis C Screening: Completed 04/27/2015  Vision Screening: Recommended annual ophthalmology exams for early detection of glaucoma and other disorders of the eye. Is the patient up to date with their annual eye exam?  Yes  Who is the provider or what is the name of the office in which the patient  attends annual eye exams? Dr. Eulas Post   Dental Screening: Recommended annual dental exams for proper oral hygiene  Community Resource Referral / Chronic Care Management: CRR required this visit?  No   CCM required this visit?  No      Plan:     I have personally reviewed and noted the following in the patient's chart:   Medical and social history Use of alcohol, tobacco or illicit drugs  Current medications and supplements including opioid prescriptions.  Functional ability and status Nutritional status Physical activity Advanced directives List of other physicians Hospitalizations, surgeries, and ER visits in previous 12 months Vitals Screenings to include cognitive, depression, and falls Referrals and appointments  In addition, I have reviewed and discussed with patient certain preventive protocols, quality metrics, and best practice recommendations. A written personalized care plan for preventive services as well as general preventive health recommendations were provided to patient.   Patient to access avs on mychart.  Marta Antu, LPN   [id number]  Nurse Health Advisor  Nurse Notes: Patient c/o difficulty sleeping. She has an upcoming apt with PCP & plans to discuss at that time.

## 2021-01-18 NOTE — Patient Instructions (Signed)
[name] , Thank you for taking time to come for your Medicare Wellness Visit. I appreciate your ongoing commitment to your health goals. Please review the following plan we discussed and let me know if I can assist you in the future.   Screening recommendations/referrals: Colonoscopy: Referral ordered today. Someone will call you to schedule Mammogram: Completed 08/12/2020-Due 08/12/2021 Bone Density: Completed 08/05/2019-Due 08/04/2021 Recommended yearly ophthalmology/optometry visit for glaucoma screening and checkup Recommended yearly dental visit for hygiene and checkup  Vaccinations: Influenza vaccine: Up to date Pneumococcal vaccine: Up to date Tdap vaccine: Up to date-Due-02/20/2029 Shingles vaccine: Completed vaccines   Covid-19:Up to date  Advanced directives: Information given today  Conditions/risks identified: See problem list  Next appointment: Follow up in one year for your annual wellness visit 01/24/2022 @ 9:00   Preventive Care 65 Years and Older, Female Preventive care refers to lifestyle choices and visits with your health care provider that can promote health and wellness. What does preventive care include? A yearly physical exam. This is also called an annual well check. Dental exams once or twice a year. Routine eye exams. Ask your health care provider how often you should have your eyes checked. Personal lifestyle choices, including: Daily care of your teeth and gums. Regular physical activity. Eating a healthy diet. Avoiding tobacco and drug use. Limiting alcohol use. Practicing safe sex. Taking low-dose aspirin every day. Taking vitamin and mineral supplements as recommended by your health care provider. What happens during an annual well check? The services and screenings done by your health care provider during your annual well check will depend on your age, overall health, lifestyle risk factors, and family history of disease. Counseling  Your health  care provider may ask you questions about your: Alcohol use. Tobacco use. Drug use. Emotional well-being. Home and relationship well-being. Sexual activity. Eating habits. History of falls. Memory and ability to understand (cognition). Work and work Statistician. Reproductive health. Screening  You may have the following tests or measurements: Height, weight, and BMI. Blood pressure. Lipid and cholesterol levels. These may be checked every 5 years, or more frequently if you are over 7 years old. Skin check. Lung cancer screening. You may have this screening every year starting at age 84 if you have a 30-pack-year history of smoking and currently smoke or have quit within the past 15 years. Fecal occult blood test (FOBT) of the stool. You may have this test every year starting at age 45. Flexible sigmoidoscopy or colonoscopy. You may have a sigmoidoscopy every 5 years or a colonoscopy every 10 years starting at age 35. Hepatitis C blood test. Hepatitis B blood test. Sexually transmitted disease (STD) testing. Diabetes screening. This is done by checking your blood sugar (glucose) after you have not eaten for a while (fasting). You may have this done every 1-3 years. Bone density scan. This is done to screen for osteoporosis. You may have this done starting at age 51. Mammogram. This may be done every 1-2 years. Talk to your health care provider about how often you should have regular mammograms. Talk with your health care provider about your test results, treatment options, and if necessary, the need for more tests. Vaccines  Your health care provider may recommend certain vaccines, such as: Influenza vaccine. This is recommended every year. Tetanus, diphtheria, and acellular pertussis (Tdap, Td) vaccine. You may need a Td booster every 10 years. Zoster vaccine. You may need this after age 65. Pneumococcal 13-valent conjugate (PCV13) vaccine. One dose is  recommended after age  76. Pneumococcal polysaccharide (PPSV23) vaccine. One dose is recommended after age 10. Talk to your health care provider about which screenings and vaccines you need and how often you need them. This information is not intended to replace advice given to you by your health care provider. Make sure you discuss any questions you have with your health care provider. Document Released: 07/09/2015 Document Revised: 03/01/2016 Document Reviewed: 04/13/2015 Elsevier Interactive Patient Education  2017 [employer] [employer] Prevention in the Home Falls can cause injuries. They can happen to people of all ages. There are many things you can do to make your home safe and to help prevent falls. What can I do on the outside of my home? Regularly fix the edges of walkways and driveways and fix any cracks. Remove anything that might make you trip as you walk through a door, such as a raised step or threshold. Trim any bushes or trees on the path to your home. Use bright outdoor lighting. Clear any walking paths of anything that might make someone trip, such as rocks or tools. Regularly check to see if handrails are loose or broken. Make sure that both sides of any steps have handrails. Any raised decks and porches should have guardrails on the edges. Have any leaves, snow, or ice cleared regularly. Use sand or salt on walking paths during winter. Clean up any spills in your garage right away. This includes oil or grease spills. What can I do in the bathroom? Use night lights. Install grab bars by the toilet and in the tub and shower. Do not use towel bars as grab bars. Use non-skid mats or decals in the tub or shower. If you need to sit down in the shower, use a plastic, non-slip stool. Keep the floor dry. Clean up any water that spills on the floor as soon as it happens. Remove soap buildup in the tub or shower regularly. Attach bath mats securely with double-sided non-slip rug tape. Do not have throw  rugs and other things on the floor that can make you trip. What can I do in the bedroom? Use night lights. Make sure that you have a light by your bed that is easy to reach. Do not use any sheets or blankets that are too big for your bed. They should not hang down onto the floor. Have a firm chair that has side arms. You can use this for support while you get dressed. Do not have throw rugs and other things on the floor that can make you trip. What can I do in the kitchen? Clean up any spills right away. Avoid walking on wet floors. Keep items that you use a lot in easy-to-reach places. If you need to reach something above you, use a strong step stool that has a grab bar. Keep electrical cords out of the way. Do not use floor polish or wax that makes floors slippery. If you must use wax, use non-skid floor wax. Do not have throw rugs and other things on the floor that can make you trip. What can I do with my stairs? Do not leave any items on the stairs. Make sure that there are handrails on both sides of the stairs and use them. Fix handrails that are broken or loose. Make sure that handrails are as long as the stairways. Check any carpeting to make sure that it is firmly attached to the stairs. Fix any carpet that is loose or worn. Avoid having  throw rugs at the top or bottom of the stairs. If you do have throw rugs, attach them to the floor with carpet tape. Make sure that you have a light switch at the top of the stairs and the bottom of the stairs. If you do not have them, ask someone to add them for you. What else can I do to help prevent falls? Wear shoes that: Do not have high heels. Have rubber bottoms. Are comfortable and fit you well. Are closed at the toe. Do not wear sandals. If you use a stepladder: Make sure that it is fully opened. Do not climb a closed stepladder. Make sure that both sides of the stepladder are locked into place. Ask someone to hold it for you, if  possible. Clearly mark and make sure that you can see: Any grab bars or handrails. First and last steps. Where the edge of each step is. Use tools that help you move around (mobility aids) if they are needed. These include: Canes. Walkers. Scooters. Crutches. Turn on the lights when you go into a dark area. Replace any light bulbs as soon as they burn out. Set up your furniture so you have a clear path. Avoid moving your furniture around. If any of your floors are uneven, fix them. If there are any pets around you, be aware of where they are. Review your medicines with your doctor. Some medicines can make you feel dizzy. This can increase your chance of falling. Ask your doctor what other things that you can do to help prevent falls. This information is not intended to replace advice given to you by your health care provider. Make sure you discuss any questions you have with your health care provider. Document Released: 04/08/2009 Document Revised: 11/18/2015 Document Reviewed: 07/17/2014 Elsevier Interactive Patient Education  2017 [employer].

## 2021-02-03 ENCOUNTER — Ambulatory Visit (INDEPENDENT_AMBULATORY_CARE_PROVIDER_SITE_OTHER): Payer: Medicare Other | Admitting: Family Medicine

## 2021-02-03 ENCOUNTER — Other Ambulatory Visit: Payer: Self-pay

## 2021-02-03 ENCOUNTER — Encounter: Payer: Self-pay | Admitting: Family Medicine

## 2021-02-03 VITALS — BP 120/70 | HR 81 | Temp 98.7°F | Resp 18 | Ht 66.0 in | Wt 151.0 lb

## 2021-02-03 DIAGNOSIS — E785 Hyperlipidemia, unspecified: Secondary | ICD-10-CM

## 2021-02-03 DIAGNOSIS — I10 Essential (primary) hypertension: Secondary | ICD-10-CM | POA: Diagnosis not present

## 2021-02-03 DIAGNOSIS — E1165 Type 2 diabetes mellitus with hyperglycemia: Secondary | ICD-10-CM

## 2021-02-03 DIAGNOSIS — E039 Hypothyroidism, unspecified: Secondary | ICD-10-CM | POA: Diagnosis not present

## 2021-02-03 LAB — LIPID PANEL
Cholesterol: 113 mg/dL (ref 0–200)
HDL: 39.5 mg/dL (ref >39.00)
LDL Cholesterol: 46 mg/dL (ref 0–99)
NonHDL: 73.5
Total CHOL/HDL Ratio: 3
Triglycerides: 139 mg/dL (ref 0.0–149.0)
VLDL: 27.8 mg/dL (ref 0.0–40.0)

## 2021-02-03 LAB — COMPREHENSIVE METABOLIC PANEL
ALT: 33 U/L (ref 0–35)
AST: 27 U/L (ref 0–37)
Albumin: 4.5 g/dL (ref 3.5–5.2)
Alkaline Phosphatase: 56 U/L (ref 39–117)
BUN: 13 mg/dL (ref 6–23)
CO2: 28 mEq/L (ref 19–32)
Calcium: 9.9 mg/dL (ref 8.4–10.5)
Chloride: 99 mEq/L (ref 96–112)
Creatinine, Ser: 0.81 mg/dL (ref 0.40–1.20)
GFR: 73.31 mL/min (ref >60.00)
Glucose, Bld: 107 mg/dL — ABNORMAL HIGH (ref 70–99)
Potassium: 4 mEq/L (ref 3.5–5.1)
Sodium: 138 mEq/L (ref 135–145)
Total Bilirubin: 0.4 mg/dL (ref 0.2–1.2)
Total Protein: 7.3 g/dL (ref 6.0–8.3)

## 2021-02-03 LAB — TSH: TSH: 3.66 u[IU]/mL (ref 0.35–5.50)

## 2021-02-03 NOTE — Assessment & Plan Note (Signed)
Per endo °

## 2021-02-03 NOTE — Assessment & Plan Note (Signed)
Well controlled, no changes to meds. Encouraged heart healthy diet such as the DASH diet and exercise as tolerated.  °

## 2021-02-03 NOTE — Progress Notes (Addendum)
Subjective:   By signing my name below, I, Shehryar Baig, attest that this documentation has been prepared under the direction and in the presence of Dr. Roma Schanz, DO. 02/03/2021    Patient ID: Alyssa Ford, female    DOB: 14-Jul-1950, 70 y.o.   MRN: [id number]  Chief Complaint  Patient presents with   Hypertension   Hyperlipidemia   Follow-up    HPI Patient is in today for a office visit to f/u bp and cholesterol     she sees endo for dm and thyroid.  She  She complains of insomnia for the past year. She was taking 50 mg amitriptyline daily PO to manage her symptoms but only found mild relief. She then started taking melatonin pills as well and found better results but still continues having some struggle sleeping.  She is also requesting to have her thyroid levels checked during her next lab work.  She continues regularly checking her blood sugars and reports they are measuring well. She continues taking 7 mg rybelsus daily PO and reports losing 9 lb since she started taking it. She also notes she is trying to lose weight.  She is due for a colonoscopy and is interested in setting up an appointment with the provider who completed it previously.  She recently had her 2nd Covid-19 booster vaccine.  Past Medical History:  Diagnosis Date   Anemia    Anxiety    Arthritis    Cataract    Chronic headaches    Depression    Diabetes mellitus    Diverticulosis of colon (without mention of hemorrhage) 2003, 2008   Colonoscopy   GERD (gastroesophageal reflux disease)    Glaucoma    Hiatal hernia 2003   EGD    Hyperlipidemia    Hypertension    Thyroid nodule     Past Surgical History:  Procedure Laterality Date   CARPAL TUNNEL RELEASE  2006   CARPAL TUNNEL RELEASE  12/24/2007   CATARACT EXTRACTION, BILATERAL     [place]---  sept and oct 2018   CERVICAL FUSION  3/08   CHOLECYSTECTOMY     FASCIECTOMY Left 08/01/2018   Procedure: FASCIECTOMY LEFT RING;  Surgeon: Daryll Brod, MD;  Location: [facility];  Service: Orthopedics;  Laterality: Left;  UPPER ARM   TRIGGER FINGER RELEASE Left 08/01/2018   Procedure: RELEASE TRIGGER FINGER/A-1 PULLEY [place];  Surgeon: Daryll Brod, MD;  Location: [facility];  Service: Orthopedics;  Laterality: Left;    Family History  Problem Relation Age of Onset   Dementia Mother    Breast cancer Mother 95   Kidney failure Mother 73       died from kidney failure from abx given to her for pneumonia   Hyperlipidemia Mother    Hypertension Mother    Diabetes Mother    Cancer Mother 62       breast   Parkinsonism Father    Diabetes Father 88   Hypertension Sister    Diabetes Brother    Hypertension Brother    Heart attack Brother    Heart disease Cousin 80       mi   Colon cancer Neg Hx     Social History   Socioeconomic History   Marital status: Married    Spouse name: Not on file   Number of children: Not on file   Years of education: Not on file   Highest education level: Not on file  Occupational  History   Occupation: retired  Tobacco Use   Smoking status: Former    Packs/day: 1.40    Years: 49.00    Pack years: 68.60    Types: Cigarettes    Quit date: 07/16/2015    Years since quitting: 5.5   Smokeless tobacco: Never   Tobacco comments:    Continues to vape but states pod do not contain nicotine  Vaping Use   Vaping Use: Every day  Substance and Sexual Activity   Alcohol use: Yes    Alcohol/week: 0.0 standard drinks    Comment: once weekly; wine and beer   Drug use: No   Sexual activity: Yes    Partners: Male  Other Topics Concern   Not on file  Social History Narrative   Exercise--  walk   Social Determinants of Health   Financial Resource Strain: Low Risk    Difficulty of Paying Living Expenses: Not hard at all  Food Insecurity: No Food Insecurity   Worried About Charity fundraiser in the Last Year: Never true   [place] in the Last Year: Never true   Transportation Needs: No Transportation Needs   Lack of Transportation (Medical): No   Lack of Transportation (Non-Medical): No  Physical Activity: Insufficiently Active   Days of Exercise per Week: 4 days   Minutes of Exercise per Session: 20 min  Stress: No Stress Concern Present   Feeling of Stress : Only a little  Social Connections: Moderately Isolated   Frequency of Communication with Friends and Family: More than three times a week   Frequency of Social Gatherings with Friends and Family: More than three times a week   Attends Religious Services: Never   Marine scientist or Organizations: No   Attends Music therapist: Never   Marital Status: Married  Human resources officer Violence: Not At Risk   Fear of Current or Ex-Partner: No   Emotionally Abused: No   Physically Abused: No   Sexually Abused: No    Outpatient Medications Prior to Visit  Medication Sig Dispense Refill   acarbose (PRECOSE) 50 MG tablet TAKE 1 TABLET(50 MG) BY MOUTH THREE TIMES DAILY WITH MEALS 270 tablet 3   amitriptyline (ELAVIL) 25 MG tablet TAKE 2 TABLETS(50 MG) BY MOUTH DAILY 180 tablet 1   aspirin EC 81 MG tablet Take 81 mg by mouth daily.     azelastine (ASTELIN) 0.1 % nasal spray Place 1 spray into both nostrils 2 (two) times daily. Per ENT     BIOTIN PO Take 1 tablet by mouth daily.      bromocriptine (PARLODEL) 2.5 MG tablet TAKE 1 TABLET(2.5 MG) BY MOUTH DAILY 90 tablet 3   Coenzyme Q10 (CO Q 10 PO) Take 1 capsule by mouth daily.     diazepam (VALIUM) 5 MG tablet TAKE 1 TABLET BY MOUTH EVERY NIGHT AT BEDTIME AS NEEDED 30 tablet 1   fenofibrate 160 MG tablet TAKE 1 TABLET(160 MG) BY MOUTH DAILY 30 tablet 2   Ferrous Sulfate (IRON SUPPLEMENT PO) Take 1 tablet by mouth 2 (two) times daily. '65mg'$      latanoprost (XALATAN) 0.005 % ophthalmic solution Place 1 drop into both eyes at bedtime.      levothyroxine (SYNTHROID) 25 MCG tablet Take 1 tablet (25 mcg total) by mouth daily. 90  tablet 3   lisinopril (ZESTRIL) 5 MG tablet TAKE 1 TABLET(5 MG) BY MOUTH DAILY 90 tablet 3   metFORMIN (GLUCOPHAGE-XR) 500 MG 24  hr tablet TAKE 4 TABLETS(2000 MG) BY MOUTH DAILY WITH BREAKFAST 360 tablet 0   Naproxen Sodium (ALEVE PO) Take 2 tablets by mouth daily.      omeprazole (PRILOSEC) 40 MG capsule TAKE 1 CAPSULE(40 MG) BY MOUTH DAILY 90 capsule 1   repaglinide (PRANDIN) 2 MG tablet TAKE 1 TABLET(2 MG) BY MOUTH THREE TIMES DAILY BEFORE MEALS 270 tablet 0   rosuvastatin (CRESTOR) 40 MG tablet TAKE 1 TABLET(40 MG) BY MOUTH AT BEDTIME 90 tablet 1   Semaglutide (RYBELSUS) 7 MG TABS Take 7 mg by mouth daily. 90 tablet 3   COVID-19 mRNA vaccine, Pfizer, 30 MCG/0.3ML injection INJECT AS DIRECTED (Patient not taking: Reported on 02/03/2021) .3 mL 0   fluticasone (FLONASE) 50 MCG/ACT nasal spray Place 2 sprays into both nostrils daily. (Patient not taking: Reported on 02/03/2021) 16 g 6   No facility-administered medications prior to visit.    Allergies  Allergen Reactions   Codeine Nausea Only    Severe nausea    Review of Systems  Constitutional:  Negative for chills, fever and malaise/fatigue.  HENT:  Negative for congestion and hearing loss.   Eyes:  Negative for blurred vision and discharge.  Respiratory:  Negative for cough, sputum production and shortness of breath.   Cardiovascular:  Negative for chest pain, palpitations and leg swelling.  Gastrointestinal:  Negative for abdominal pain, blood in stool, constipation, diarrhea, heartburn, nausea and vomiting.  Genitourinary:  Negative for dysuria, frequency, hematuria and urgency.  Musculoskeletal:  Negative for back pain, falls and myalgias.  Skin:  Negative for rash.  Neurological:  Negative for dizziness, sensory change, loss of consciousness, weakness and headaches.  Endo/Heme/Allergies:  Negative for environmental allergies. Does not bruise/bleed easily.  Psychiatric/Behavioral:  Negative for depression and suicidal ideas. The  patient has insomnia. The patient is not nervous/anxious.       Objective:    Physical Exam Vitals and nursing note reviewed.  Constitutional:      General: She is not in acute distress.    Appearance: Normal appearance. She is not ill-appearing.  HENT:     Head: Normocephalic and atraumatic.     Right Ear: External ear normal.     Left Ear: External ear normal.  Eyes:     Extraocular Movements: Extraocular movements intact.     Pupils: Pupils are equal, round, and reactive to light.  Cardiovascular:     Rate and Rhythm: Normal rate and regular rhythm.     Heart sounds: Normal heart sounds. No murmur heard.   No gallop.  Pulmonary:     Effort: Pulmonary effort is normal. No respiratory distress.     Breath sounds: Normal breath sounds. No wheezing or rales.  Skin:    General: Skin is warm and dry.  Neurological:     Mental Status: She is alert and oriented to person, place, and time.  Psychiatric:        Behavior: Behavior normal.        Judgment: Judgment normal.    BP 120/70 (BP Location: Left Arm, Patient Position: Sitting, Cuff Size: Normal)   Pulse 81   Temp 98.7 F (37.1 C) (Oral)   Resp 18   Ht '5\' 6"'$  (1.676 m)   Wt 151 lb (68.5 kg)   SpO2 97%   BMI 24.37 kg/m  Wt Readings from Last 3 Encounters:  02/03/21 151 lb (68.5 kg)  01/18/21 154 lb 6.4 oz (70 kg)  11/17/20 152 lb 12.8 oz (69.3 kg)  Diabetic Foot Exam - Simple   No data filed    Lab Results  Component Value Date   WBC 6.7 08/04/2020   HGB 12.5 08/04/2020   HCT 38.2 08/04/2020   PLT 257 08/04/2020   GLUCOSE 128 (H) 09/06/2020   CHOL 110 09/06/2020   TRIG 128.0 09/06/2020   HDL 38.20 (L) 09/06/2020   LDLDIRECT 87.0 06/07/2020   LDLCALC 46 09/06/2020   ALT 27 09/06/2020   AST 20 09/06/2020   NA 139 09/06/2020   K 4.4 09/06/2020   CL 100 09/06/2020   CREATININE 0.94 09/06/2020   BUN 18 09/06/2020   CO2 31 09/06/2020   TSH 6.51 (H) 08/24/2020   HGBA1C 6.5 (A) 11/17/2020   MICROALBUR  1.1 04/17/2019    Lab Results  Component Value Date   TSH 6.51 (H) 08/24/2020   Lab Results  Component Value Date   WBC 6.7 08/04/2020   HGB 12.5 08/04/2020   HCT 38.2 08/04/2020   MCV 88.8 08/04/2020   PLT 257 08/04/2020   Lab Results  Component Value Date   NA 139 09/06/2020   K 4.4 09/06/2020   CO2 31 09/06/2020   GLUCOSE 128 (H) 09/06/2020   BUN 18 09/06/2020   CREATININE 0.94 09/06/2020   BILITOT 0.4 09/06/2020   ALKPHOS 49 09/06/2020   AST 20 09/06/2020   ALT 27 09/06/2020   PROT 7.1 09/06/2020   ALBUMIN 4.5 09/06/2020   CALCIUM 10.3 09/06/2020   ANIONGAP 11 08/04/2020   GFR 61.50 09/06/2020   Lab Results  Component Value Date   CHOL 110 09/06/2020   Lab Results  Component Value Date   HDL 38.20 (L) 09/06/2020   Lab Results  Component Value Date   LDLCALC 46 09/06/2020   Lab Results  Component Value Date   TRIG 128.0 09/06/2020   Lab Results  Component Value Date   CHOLHDL 3 09/06/2020   Lab Results  Component Value Date   HGBA1C 6.5 (A) 11/17/2020       Assessment & Plan:   Problem List Items Addressed This Visit       Unprioritized   Essential hypertension    Well controlled, no changes to meds. Encouraged heart healthy diet such as the DASH diet and exercise as tolerated.       Hyperlipidemia LDL goal <70    Tolerating statin, encouraged heart healthy diet, avoid trans fats, minimize simple carbs and saturated fats. Increase exercise as tolerated      Hypothyroidism    Per endo       Relevant Orders   TSH   Uncontrolled type 2 diabetes mellitus with hyperglycemia ([place])    Per endo       Other Visit Diagnoses     Primary hypertension    -  Primary   Relevant Orders   Lipid panel   Comprehensive metabolic panel   Hyperlipidemia, unspecified hyperlipidemia type       Relevant Orders   Lipid panel   Comprehensive metabolic panel        No orders of the defined types were placed in this encounter.   I, Dr.  Roma Schanz, DO, personally preformed the services described in this documentation.  All medical record entries made by the scribe were at my direction and in my presence.  I have reviewed the chart and discharge instructions (if applicable) and agree that the record reflects my personal performance and is accurate and complete. 02/03/2021   I,Shehryar Baig,acting as a  scribe for Ann Held, DO.,have documented all relevant documentation on the behalf of Ann Held, DO,as directed by  Ann Held, DO while in the presence of Ann Held, DO.   Ann Held, DO

## 2021-02-03 NOTE — Assessment & Plan Note (Signed)
Tolerating statin, encouraged heart healthy diet, avoid trans fats, minimize simple carbs and saturated fats. Increase exercise as tolerated 

## 2021-02-03 NOTE — Patient Instructions (Addendum)

## 2021-02-23 ENCOUNTER — Other Ambulatory Visit: Payer: Self-pay | Admitting: Family Medicine

## 2021-03-01 ENCOUNTER — Encounter: Payer: Self-pay | Admitting: Gastroenterology

## 2021-03-15 ENCOUNTER — Ambulatory Visit (INDEPENDENT_AMBULATORY_CARE_PROVIDER_SITE_OTHER): Payer: Medicare Other | Admitting: Pharmacist

## 2021-03-15 DIAGNOSIS — I10 Essential (primary) hypertension: Secondary | ICD-10-CM

## 2021-03-15 NOTE — Chronic Care Management (AMB) (Signed)
Chronic Care Management Pharmacy Note  03/15/2021 Name:  Alyssa Ford MRN:  [id number] DOB:  01/07/1951  Subjective: Alyssa Ford is an 70 y.o. year old female who is a primary patient of Ann Held, DO.  The CCM team was consulted for assistance with disease management and care coordination needs.    Engaged with patient by telephone for follow up visit in response to provider referral for pharmacy case management and/or care coordination services.   Consent to Services:  The patient was given information about Chronic Care Management services, agreed to services, and gave verbal consent prior to initiation of services.  Please see initial visit note for detailed documentation.   Patient Care Team: Carollee Herter, Alferd Apa, DO as PCP - General Calvert Cantor, MD as Consulting Physician (Ophthalmology) Levy Sjogren, MD as Referring Physician (Dermatology) Ilda Foil, Sol Blazing, MD as Referring Physician (Pediatrics) Renato Shin, MD as Consulting Physician (Endocrinology) Cherre Robins, PharmD (Pharmacist)  Recent office visits: 02/03/2021 - PCP (Dr Etter Sjogren) F/U chronic conditions. No medication changes. BMP, TSH and lipids checked. ER Follow-up - chest pain. Referred to cardio for workup of CP; Patient also mentioned BG elevated. Recommended take acarbose $RemoveBefor'100mg'dcGnPLxPIMDR$  tid for BG over 300. F/U with endo in 3 days (Monday).  Recent consult visits: 11/17/2020 - Endo (Dr Loanne Drilling) added levothyroxine 63mcg daily; No change in meds for DM; A1c was 6.5% - much improved! 08/24/2020 - Endo (Dr Loanne Drilling) - f/u diabetes; Increased Rybelsus form $RemoveBefor'3mg'OYWXWCkegvDD$  daily to $Remove'7mg'gaJicva$  daily. 08/19/2020 - Cardio (Dr Johnsie Cancel) Initial evaluation of CP. No med changes  Hospital visits: None in last 6 months  Objective:  Lab Results  Component Value Date   CREATININE 0.81 02/03/2021   CREATININE 0.94 09/06/2020   CREATININE 0.96 08/04/2020    Lab Results  Component Value Date   HGBA1C  6.5 (A) 11/17/2020   Last diabetic Eye exam:  Lab Results  Component Value Date/Time   HMDIABEYEEXA Retinopathy (A) 06/17/2018 12:00 AM    Last diabetic Foot exam:  Lab Results  Component Value Date/Time   HMDIABFOOTEX normal 06/06/2010 12:00 AM        Component Value Date/Time   CHOL 113 02/03/2021 0846   TRIG 139.0 02/03/2021 0846   TRIG 216 (HH) 04/24/2006 0741   HDL 39.50 02/03/2021 0846   CHOLHDL 3 02/03/2021 0846   VLDL 27.8 02/03/2021 0846   LDLCALC 46 02/03/2021 0846   LDLDIRECT 87.0 06/07/2020 0838    Hepatic Function Latest Ref Rng & Units 02/03/2021 09/06/2020 08/04/2020  Total Protein 6.0 - 8.3 g/dL 7.3 7.1 7.5  Albumin 3.5 - 5.2 g/dL 4.5 4.5 4.2  AST 0 - 37 U/L $Remo'27 20 28  'uDSDW$ ALT 0 - 35 U/L 33 27 31  Alk Phosphatase 39 - 117 U/L 56 49 60  Total Bilirubin 0.2 - 1.2 mg/dL 0.4 0.4 0.2(L)  Bilirubin, Direct 0.0 - 0.3 mg/dL - - -    Lab Results  Component Value Date/Time   TSH 3.66 02/03/2021 08:46 AM   TSH 6.51 (H) 08/24/2020 07:56 AM   FREET4 1.31 08/24/2020 07:56 AM   FREET4 1.28 04/22/2020 08:12 AM    CBC Latest Ref Rng & Units 08/04/2020 01/06/2020 09/22/2019  WBC 4.0 - 10.5 K/uL 6.7 9.0 9.6  Hemoglobin 12.0 - 15.0 g/dL 12.5 12.9 12.3  Hematocrit 36.0 - 46.0 % 38.2 38.8 36.5  Platelets 150 - 400 K/uL 257 260.0 260.0    Lab Results  Component Value Date/Time  VD25OH 43 02/23/2010 09:05 PM   VD25OH 25 (L) 07/14/2009 09:49 PM    Clinical ASCVD: Yes  The ASCVD Risk score (Arnett DK, et al., 2019) failed to calculate for the following reasons:   The valid total cholesterol range is 130 to 320 mg/dL     Social History   Tobacco Use  Smoking Status Former   Packs/day: 1.40   Years: 49.00   Pack years: 68.60   Types: Cigarettes   Quit date: 07/16/2015   Years since quitting: 5.6  Smokeless Tobacco Never  Tobacco Comments   Continues to vape but states pod do not contain nicotine   BP Readings from Last 3 Encounters:  02/03/21 120/70  01/18/21  138/80  11/17/20 110/60   Pulse Readings from Last 3 Encounters:  02/03/21 81  01/18/21 67  11/17/20 72   Wt Readings from Last 3 Encounters:  02/03/21 151 lb (68.5 kg)  01/18/21 154 lb 6.4 oz (70 kg)  11/17/20 152 lb 12.8 oz (69.3 kg)    Assessment: Review of patient past medical history, allergies, medications, health status, including review of consultants reports, laboratory and other test data, was performed as part of comprehensive evaluation and provision of chronic care management services.   SDOH:  (Social Determinants of Health) assessments and interventions performed:    CCM Care Plan  Allergies  Allergen Reactions   Codeine Nausea Only    Severe nausea    Medications Reviewed Today     Reviewed by Cherre Robins, PharmD (Pharmacist) on 03/15/21 at 1114  Med List Status: <None>   Medication Order Taking? Sig Documenting Provider Last Dose Status Informant  acarbose (PRECOSE) 50 MG tablet [id number] Yes TAKE 1 TABLET(50 MG) BY MOUTH THREE TIMES DAILY WITH MEALS Renato Shin, MD Taking Active   amitriptyline (ELAVIL) 25 MG tablet [id number] Yes TAKE 2 TABLETS(50 MG) BY MOUTH DAILY Ann Held, DO Taking Active   aspirin EC 81 MG tablet [id number] Yes Take 81 mg by mouth daily. Ann Held, DO Taking Active Self  azelastine (ASTELIN) 0.1 % nasal spray [id number] Yes Place 1 spray into both nostrils 2 (two) times daily. Per ENT [provider] Taking Active   BIOTIN PO [id number] Yes Take 1 tablet by mouth daily.  [provider] Taking Active Self  bromocriptine (PARLODEL) 2.5 MG tablet [id number] Yes TAKE 1 TABLET(2.5 MG) BY MOUTH DAILY Renato Shin, MD Taking Active   Coenzyme Q10 (CO Q 10 PO) [id number] Yes Take 1 capsule by mouth daily. [provider] Taking Active   COVID-19 mRNA vaccine, Pfizer, 30 MCG/0.3ML injection [id number] Yes INJECT AS DIRECTED Carlyle Basques, MD Taking Active   diazepam (VALIUM) 5 MG tablet  [id number] Yes TAKE 1 TABLET BY MOUTH EVERY NIGHT AT BEDTIME AS NEEDED Ann Held, DO Taking Active   fenofibrate 160 MG tablet [id number] Yes TAKE 1 TABLET(160 MG) BY MOUTH DAILY Ann Held, DO Taking Active   Ferrous Sulfate (IRON SUPPLEMENT PO) [id number] Yes Take 1 tablet by mouth 2 (two) times daily. $RemoveBefo'65mg'gthPovFPuTu$  [provider] Taking Active   fluticasone (FLONASE) 50 MCG/ACT nasal spray [id number] Yes Place 2 sprays into both nostrils daily. Roma Schanz R, DO Taking Active   latanoprost (XALATAN) 0.005 % ophthalmic solution [id number] Yes Place 1 drop into both eyes at bedtime.  [provider] Taking Active            Med Note Marikay Alar Aug 19, 2020  3:42 PM)    levothyroxine (SYNTHROID) 25 MCG tablet [id number] Yes Take 1 tablet (25 mcg total) by mouth daily. Renato Shin, MD Taking Active   lisinopril (ZESTRIL) 5 MG tablet [id number] Yes TAKE 1 TABLET(5 MG) BY MOUTH DAILY Carollee Herter, Alferd Apa, DO Taking Active   metFORMIN (GLUCOPHAGE-XR) 500 MG 24 hr tablet [id number] Yes TAKE 4 TABLETS(2000 MG) BY MOUTH DAILY WITH Paticia Stack, MD Taking Active   Naproxen Sodium (ALEVE PO) [id number] Yes Take 2 tablets by mouth daily.  [provider] Taking Active Self           Med Note Dorothyann Peng, MARTHA A   Tue Jan 18, 2021  9:00 AM) Taking as needed  omeprazole (PRILOSEC) 40 MG capsule [id number] Yes TAKE 1 CAPSULE(40 MG) BY MOUTH DAILY Carollee Herter, Alferd Apa, DO Taking Active   repaglinide (PRANDIN) 2 MG tablet [id number] Yes TAKE 1 TABLET(2 MG) BY MOUTH THREE TIMES DAILY BEFORE MEALS Renato Shin, MD Taking Active   rosuvastatin (CRESTOR) 40 MG tablet [id number] Yes TAKE 1 TABLET(40 MG) BY MOUTH AT BEDTIME Carollee Herter, Kendrick Fries R, DO Taking Active   Semaglutide (RYBELSUS) 7 MG TABS [id number] Yes Take 7 mg by mouth daily. Renato Shin, MD Taking Active            Med Note Antony Contras, Jenene Slicker Jan 10, 2021 11:52 AM) Receiving from  [employer] PAP thru 05/2021            Patient Active Problem List   Diagnosis Date Noted   Hypothyroidism 08/24/2020   Chest pain 08/06/2020   Trichimoniasis 04/27/2020   Cervical cancer screening 04/27/2020   Left thyroid nodule 04/13/2020   Vaginal odor 01/11/2020   Icterus 01/11/2020   Uncontrolled type 2 diabetes mellitus with hyperglycemia ([place]) 09/22/2019   De Quervain's disease (tenosynovitis) 09/22/2019   Neck mass 07/11/2018   Abnormal WBC count 05/29/2017   Skin fissure 09/24/2015   Allergic rhinitis 03/26/2014   Wheezing 03/26/2014   DM (diabetes mellitus) type II uncontrolled, periph vascular disorder ([place]) 08/25/2011   Headache(784.0) 08/21/2011   Acute sinusitis 04/26/2010   URI 04/26/2010   UNSPECIFIED CATARACT 02/23/2010   POSTMENOPAUSAL STATUS 12/12/2007   FATIGUE 09/11/2007   THYROID NODULE 11/22/2006   Diabetes ([place]) 11/22/2006   Hyperlipidemia LDL goal <70 11/22/2006   GLUCOMA 11/22/2006   Essential hypertension 11/22/2006   GERD 11/22/2006    Immunization History  Administered Date(s) Administered   Fluad Quad(high Dose 65+) 04/08/2019, 04/08/2020   Influenza Split 04/13/2011   Influenza Whole 04/17/2006   Influenza, High Dose Seasonal PF 04/12/2016, 05/07/2017, 04/08/2018   Influenza, Seasonal, Injecte, Preservative Fre 05/22/2012   Influenza,inj,Quad PF,6+ Mos 03/03/2014, 03/18/2015   Influenza-Unspecified 05/07/2017   PFIZER(Purple Top)SARS-COV-2 Vaccination 07/21/2019, 08/11/2019, 04/27/2020, 11/23/2020   Pneumococcal Conjugate-13 09/13/2015, 05/07/2017, 04/15/2019   Pneumococcal Polysaccharide-23 03/31/2003, 11/03/2008, 05/07/2017   Td 10/24/1999, 02/23/2010   Tdap 02/21/2019   Zoster Recombinat (Shingrix) 03/09/2017, 06/23/2017   Zoster, Live 12/25/2011    Conditions to be addressed/monitored: CAD, HTN and DMII  Care Plan : General Pharmacy (Adult)  Updates made by Cherre Robins, PHARMD since 03/15/2021 12:00 AM     Problem:  Medication management and chronic disease management, support, education and care coordination for diabetes, HTN, HDL, nerve pain, anxiety, glaucoma, insomnia and GERD   Priority: High  Onset Date: 11/10/2020  Note:   Current Barriers:  Unable to maintain control of type 2 DM (A1c improved and at goal as of 11/17/20) Difficulty  sleeping not improved with current therapy Chronic Disease Management support, education, and care coordination needed in related to Diabetes, Hypertension, Hyperlipidemia, Nerve Pain, Anxiety, Glaucoma, GERD, Insomnia  Pharmacist Clinical Goal(s):  Over the next 90 days, patient will verbalize ability to afford treatment regimen achieve control of diabetes as evidenced by A1c < 7.0% maintain control of hyperlipidemia and HTN as evidenced by maintaining goals listed below  Improve sleep duration and quality  through collaboration with PharmD and provider.   Interventions: 1:1 collaboration with Carollee Herter, Alferd Apa, DO regarding development and update of comprehensive plan of care as evidenced by provider attestation and co-signature Inter-disciplinary care team collaboration (see longitudinal plan of care) Comprehensive medication review performed; medication list updated in electronic medical record  Hypertension Controlled; BP goal <140/90 Current regimen:  Lisinopril 5mg  daily in morning Interventions: Check blood pressure weekly, document, and provide at future appointments Ensure daily salt intake < 2300 mg/day  Hyperlipidemia Controlled; LDL goal < 100 and Triglycerides <150 Current regimen:  Rosuvastatin 40mg  daily (for LDL / 'bad' cholesterol) Fenofibrate 160mg  dialy (for triglycerides) Coenzyne Q10 200mg  daily Interventions: Reviewed goals for triglycerides and LDL. Maintain current cholesterol medication regimen.   Diabetes As of 11/17/2020 at goal - A1c was 6.5%;  A1c goal <7%  Recent home BG readings have been improving Checking every other  day Ranging from 110 to 130 Denies hypoglycemia symptoms She does report that she has noticed decreased appetite since starting Rybelsus. Sometimes she feel fullness but knows that Rybelsus is working well.  Patient has been enrolled in June 2022 with [employer] medication assistance. Received first shipment around 01/05/2021. Our office received letter in August that she would get another shipment for 120 days around 02/19/2021. Patient states she has plenty at this time to last until around November. Called [employer] [facility] and looks like shipment is in progress but likely wont be shipped for another 14 days. This should get patient through until end of 2022.  Current regimen:  Acarbose 50mg  three times a day with first bite of each meal Metformin XR 500 - take four tablets with morning meal Repaglinide 2mg  - take 2 tablets 30 minutes before each meal Bromocriptine 2.5mg  daily Rybelsus 7mg  - take daily 30 minutes before morning meal Interventions;  Reviewed appropriate timing of medication administration to get best blood glucose  lowering effects Recommended to check blood sugar daily, document, and provide at future appointments Coordinated with [employer] medication assistance program. If A1c continue to be <7.0 - might be able to consider lowering dose of acarbose to see if this helps with fullness / decreased appetite  - though I did explain to patient that she is how Rybelsus works to improve blood glucose.  Continue to walk daily to achieve goal of 150 minutes per week of moderate intensity exercise (like walking)  GERD (goal -  reduce symptoms of GERD) Controlled Denies breakthrough GERD symptoms except when she occasionally forgets to take omeprazole Current regimen:  Omeprazole 40mg  daily Patient self care activities - Over the next 90 days, patient will: Maintain current medication regimen for acid relfux  Thyroid Health:  Lab Results  Component Value Date   TSH 3.66  02/03/2021   Lab Results  Component Value Date   TSH 6.51 (H) 08/24/2020  Dr Loanne Drilling started thyroid supplementation 11/17/20 Current regimen:  Levothyroxine 82mcg daily - started 11/17/2020 Interventions: Patient advised to hold Biotin at least 1 week prior to any thyroid testing Follow up with Dr Loanne Drilling as planned.  Insomnia / Jaw Pain:  Current regimen:  Amitriptyline $RemoveBefore'25mg'qcWFOfKSGjTTN$  - take 2 tablets = $RemoveB'50mg'PqGCGqdr$  at bedtime Diazepam $RemoveBeforeDE'5mg'zAMYrhPAEzArsPN$  take at bedtime as needed for sleep (fill #30 tablet prescription about 2 times per year) ZZZ Quil Chamomile and Lavender Gummies - takes 2 per night (contains $RemoveBeforeD'2mg'gmhyoGxCnbNcVM$  of melatonin in 2 gummies) - added by Dr Carollee Herter 01/2021.  Patient reports she still if having difficulty sleeping. Usually as 2 good nights of sleep per week.  She reports taking diazepam only about 1 or 2 times per week and does not seem to be helping now.  She can usually get to sleep but wakes around 2am and takes about 2 hours to get back to sleep.  Denies watching TV in bed. Sometimes will take extra melatonin (plain, not ZZZ Quil).  Interventions: Recommended she try ZZZQuil triple action which has $RemoveB'6mg'xWkQAoin$  of melatonin in 2 gummies.  Will consult with primary care physician regarding sleep Recommended continue amitriptyline and diazepam per above directions.   Medication management Current pharmacy: [facility] Interventions Comprehensive medication review performed. Continue current medication management strategy Reviewed medication refill history to assess adherence  Patient Goals/Self-Care Activities Over the next 90 days, patient will:  take medications as prescribed, check glucose 3 to 4 times a day, document, and provide at future appointments, and collaborate with provider on medication access solutions  Follow Up Plan: Telephone follow up appointment with care management team member scheduled for:  90 day, The patient has been provided with contact information for the care management  team and has been advised to call with any health related questions or concerns. , and The care management team will reach out to the patient again over the next 30 days.         Medication Assistance: Rybelsus has been approved thru [employer] - initial approval 11/2020 - first shipment 01/05/2021. Approved through 05/25/2021.  [place] to check on next delivery as out office received a letter stating that medications would be delivered again in September. Automated system reported that next shipment is in progress but has not been shipped yet. Patient reports she has enough Rybelsus until end of October / beginning of November.   Patient's preferred pharmacy is:  [facility] [place], [address] AT [address] [place] [address] Phone: [phone] Fax: [phone]   Follow Up:  Patient agrees to Care Plan and Follow-up.  Plan: The care management team will reach out to the patient again over the next 45 days. (to check on PAP reorder and begin process for 0964 PAP application)   Cherre Robins, PharmD Clinical Pharmacist [facility] [phone]

## 2021-03-15 NOTE — Patient Instructions (Signed)
Visit Information  PATIENT GOALS:  Goals Addressed             This Visit's Progress    Chronic Care Management Pharmacy Care Plan   On track    CARE PLAN ENTRY (see longitudinal plan of care for additional care plan information)  Current Barriers:  Chronic Disease Management support, education, and care coordination needs related to Diabetes, Hypertension, Hyperlipidemia, Nerve Pain, Anxiety, Glaucoma, GERD, Insomnia   Hypertension BP Readings from Last 3 Encounters:  02/03/21 120/70  01/18/21 138/80  11/17/20 110/60  Pharmacist Clinical Goal(s): Over the next 90 days, patient will work with PharmD and providers to maintain BP goal <140/90 Current regimen:  Lisinopril 5mg  daily in morning Patient self care activities - Over the next 90 days, patient will: Check blood pressure weekly, document, and provide at future appointments Ensure daily salt intake < 2300 mg/day  Hyperlipidemia Lipid Panel     Component Value Date/Time   CHOL 110 09/06/2020 0746   TRIG 128.0 09/06/2020 0746   HDL 38.20 (L) 09/06/2020 0746   CHOLHDL 3 09/06/2020 0746   VLDL 25.6 09/06/2020 0746   [place] 46 09/06/2020 0746  Pharmacist Clinical Goal(s): Over the next 90 days, patient will work with PharmD and providers to maintain LDL goal < 100 and Triglycerides <150 Current regimen:  Rosuvastatin 40mg  daily (for LDL / 'bad' cholesterol) Fenofibrate 160mg  dialy (for triglycerides) Coenzyne Q10 200mg  daily Interventions: Reviewed goals for triglycerides and LDL. Maintain current cholesterol medication regimen.   Diabetes Lab Results  Component Value Date   HGBA1C 6.5 (A) 11/17/2020  Pharmacist Clinical Goal(s): Over the next 90 days, patient will work with PharmD and providers to maintain A1c goal <7% Current regimen:  Acarbose 50mg  three times a day with first bite of each meal Metformin XR 500 - take four tablets with morning meal Repaglinide 2mg  - take 2 tablets 30 minutes before each  meal Bromocriptine 2.5mg  daily Rybelsus 7mg  - take daily 30 minutes before morning meal Patient self care activities - Over the next 90 days, patient will: Check blood sugar once daily, document, and provide at future appointments Contact provider with any episodes of hypoglycemia Reviewed and complete patient assistance application for Rybelsus. (done - received from patient 11/17/2020) Contact clinical pharmacist when you have about 1 month of Rybelsus remaining to assist in reordering from medication assistance program. Increase exercise frequency to achieve goal of 150 minutes per week of moderate intensity exercise (like walking)  GERD Pharmacist Clinical Goal(s) Over the next 90 days, patient will work with PharmD and providers to reduce symptoms of GERD Current regimen:  Omeprazole 40mg  daily Patient self care activities - Over the next 90 days, patient will: Maintain current medication regimen acid relfux  Thyroid Health:   Lab Results  Component Value Date   TSH 3.66 02/03/2021  Pharmacist Clinical Goal(s) Over the next 90 days, patient will work with PharmD and providers to supplement thyroid hormone and improve sleep Current regimen:  Levotyroxine 25 mcg daily in morning - started 11/17/2020 Interventions: Patient advised to hold Biotin for 1 week prior to thyroid testing  Patient self care activities - Over the next 90 days, patient will: Do not take Biotin 1 week prior to thyroid testing Follow up with Dr Loanne Drilling as planned.   Insomnia / Jaw Pain:  Pharmacist Clinical Goal(s) Over the next 90 days, patient will work with PharmD and providers to improve sleep and control jaw pain Current regimen:  Amitriptyline 25mg  - take  2 tablets = 50mg  at bedtime Diazepam 5mg  take at bedtime as needed for sleep (fill #30 tablet prescription about 2 times per year) ZZZ Quil Chamomile and Lavender Gummies - takes 2 per night (contains 2mg  of melatonin in 2 gummies) - added by Dr  Carollee Herter 01/2021. Interventions:  Sleep hygiene discussed Recommended she try ZZZQuil triple action which has 6mg  of melatonin in 2 gummies.  Patient self care activities - Over the next 90 days, patient will: Trial of ZZZQuil triple action which has 6mg  of melatonin in 2 gummies (replaces ZZZ Quil Chamomile and Lavender) Continue amitriptyline and diazepam per above directions.  Medication management Pharmacist Clinical Goal(s): Over the next 90 days, patient will work with PharmD and providers to maintain optimal medication adherence Current pharmacy: [facility] Interventions Comprehensive medication review performed. Continue current medication management strategy Patient self care activities - Over the next 90 days, patient will: Focus on medication adherence by filling medications appropriately  Take medications as prescribed Report any questions or concerns to PharmD and/or provider(s)  Please see past updates related to this goal by clicking on the "Past Updates" button in the selected goal          Patient verbalizes understanding of instructions provided today and agrees to view in [place].   Telephone follow up appointment with care management team member scheduled for: 3 months with clinical pharmacist; Chronic Care Management CMA will check in with patient regarding Rybelsus medication assistance in 4 to 6 weeks.   Cherre Robins, PharmD Clinical Pharmacist [facility]

## 2021-03-22 ENCOUNTER — Ambulatory Visit (INDEPENDENT_AMBULATORY_CARE_PROVIDER_SITE_OTHER): Payer: Medicare Other | Admitting: Endocrinology

## 2021-03-22 ENCOUNTER — Other Ambulatory Visit: Payer: Self-pay | Admitting: Family Medicine

## 2021-03-22 ENCOUNTER — Other Ambulatory Visit: Payer: Self-pay

## 2021-03-22 ENCOUNTER — Other Ambulatory Visit: Payer: Self-pay | Admitting: Endocrinology

## 2021-03-22 VITALS — BP 142/80 | HR 79 | Ht 66.0 in | Wt 151.2 lb

## 2021-03-22 DIAGNOSIS — E118 Type 2 diabetes mellitus with unspecified complications: Secondary | ICD-10-CM

## 2021-03-22 DIAGNOSIS — E1151 Type 2 diabetes mellitus with diabetic peripheral angiopathy without gangrene: Secondary | ICD-10-CM

## 2021-03-22 DIAGNOSIS — E1142 Type 2 diabetes mellitus with diabetic polyneuropathy: Secondary | ICD-10-CM

## 2021-03-22 DIAGNOSIS — E1165 Type 2 diabetes mellitus with hyperglycemia: Secondary | ICD-10-CM

## 2021-03-22 DIAGNOSIS — G4700 Insomnia, unspecified: Secondary | ICD-10-CM

## 2021-03-22 DIAGNOSIS — IMO0002 Reserved for concepts with insufficient information to code with codable children: Secondary | ICD-10-CM

## 2021-03-22 DIAGNOSIS — E1169 Type 2 diabetes mellitus with other specified complication: Secondary | ICD-10-CM

## 2021-03-22 LAB — POCT GLYCOSYLATED HEMOGLOBIN (HGB A1C): Hemoglobin A1C: 5.9 % — AB (ref 4.0–5.6)

## 2021-03-22 MED ORDER — REPAGLINIDE 2 MG PO TABS: 2.0000 mg | ORAL_TABLET | Freq: Two times a day (BID) | ORAL | 3 refills | Status: DC

## 2021-03-22 NOTE — Progress Notes (Signed)
Subjective:    Patient ID: Alyssa Ford, female    DOB: [birth year], 71 y.o.   MRN: [id number]  HPI Pt returns for f/u of diabetes mellitus:  DM type: 2 Dx'ed: 7829 Complications: PN and CAD (seen on CT).   Therapy: 5 oral meds GDM: never DKA: never Severe hypoglycemia: never Pancreatitis: never Pancreatic imaging: never Other: she has never been on insulin; SDOH: she wants generic meds, if possible.   Interval history: Pt states cbg's are well-controlled.  She takes meds as rx'ed.  pt states she feels well in general, except for heartburn and bloating.  Pt also has MNG ([place] in 2020 showed LLP nodule; CT 9/20 and [place] 2021 were unchanged; pt say ENT in [place] is doing f/u US).   She takes synthroid as rx'ed.   Past Medical History:  Diagnosis Date   Anemia    Anxiety    Arthritis    Cataract    Chronic headaches    Depression    Diabetes mellitus    Diverticulosis of colon (without mention of hemorrhage) 2003, 2008   Colonoscopy   GERD (gastroesophageal reflux disease)    Glaucoma    Hiatal hernia 2003   EGD    Hyperlipidemia    Hypertension    Thyroid nodule     Past Surgical History:  Procedure Laterality Date   CARPAL TUNNEL RELEASE  2006   CARPAL TUNNEL RELEASE  12/24/2007   CATARACT EXTRACTION, BILATERAL     [place]---  sept and oct 2018   CERVICAL FUSION  3/08   CHOLECYSTECTOMY     FASCIECTOMY Left 08/01/2018   Procedure: FASCIECTOMY LEFT RING;  Surgeon: Daryll Brod, MD;  Location: [facility];  Service: Orthopedics;  Laterality: Left;  UPPER ARM   TRIGGER FINGER RELEASE Left 08/01/2018   Procedure: RELEASE TRIGGER FINGER/A-1 PULLEY [place];  Surgeon: Daryll Brod, MD;  Location: [facility];  Service: Orthopedics;  Laterality: Left;    Social History   Socioeconomic History   Marital status: Married    Spouse name: Not on file   Number of children: Not on file   Years of education: Not on file   Highest education level: Not on file   Occupational History   Occupation: retired  Tobacco Use   Smoking status: Former    Packs/day: 1.40    Years: 49.00    Pack years: 68.60    Types: Cigarettes    Quit date: 07/16/2015    Years since quitting: 5.6   Smokeless tobacco: Never   Tobacco comments:    Continues to vape but states pod do not contain nicotine  Vaping Use   Vaping Use: Every day  Substance and Sexual Activity   Alcohol use: Yes    Alcohol/week: 0.0 standard drinks    Comment: once weekly; wine and beer   Drug use: No   Sexual activity: Yes    Partners: Male  Other Topics Concern   Not on file  Social History Narrative   Exercise--  walk   Social Determinants of Health   Financial Resource Strain: Low Risk    Difficulty of Paying Living Expenses: Not hard at all  Food Insecurity: No Food Insecurity   Worried About Charity fundraiser in the Last Year: Never true   [place] in the Last Year: Never true  Transportation Needs: No Transportation Needs   Lack of Transportation (Medical): No   Lack of Transportation (Non-Medical): No  Physical Activity: Insufficiently Active   Days of Exercise per Week: 4 days   Minutes of Exercise per Session: 20 min  Stress: No Stress Concern Present   Feeling of Stress : Only a little  Social Connections: Moderately Isolated   Frequency of Communication with Friends and Family: More than three times a week   Frequency of Social Gatherings with Friends and Family: More than three times a week   Attends Religious Services: Never   Marine scientist or Organizations: No   Attends Music therapist: Never   Marital Status: Married  Human resources officer Violence: Not At Risk   Fear of Current or Ex-Partner: No   Emotionally Abused: No   Physically Abused: No   Sexually Abused: No    Current Outpatient Medications on File Prior to Visit  Medication Sig Dispense Refill   acarbose (PRECOSE) 50 MG tablet TAKE 1 TABLET(50 MG) BY MOUTH THREE  TIMES DAILY WITH MEALS 270 tablet 3   aspirin EC 81 MG tablet Take 81 mg by mouth daily.     azelastine (ASTELIN) 0.1 % nasal spray Place 1 spray into both nostrils 2 (two) times daily. Per ENT     BIOTIN PO Take 1 tablet by mouth daily.      bromocriptine (PARLODEL) 2.5 MG tablet TAKE 1 TABLET(2.5 MG) BY MOUTH DAILY 90 tablet 3   Coenzyme Q10 (CO Q 10 PO) Take 1 capsule by mouth daily.     COVID-19 mRNA vaccine, Pfizer, 30 MCG/0.3ML injection INJECT AS DIRECTED .3 mL 0   diazepam (VALIUM) 5 MG tablet TAKE 1 TABLET BY MOUTH EVERY NIGHT AT BEDTIME AS NEEDED 30 tablet 1   fenofibrate 160 MG tablet TAKE 1 TABLET(160 MG) BY MOUTH DAILY 90 tablet 1   Ferrous Sulfate (IRON SUPPLEMENT PO) Take 1 tablet by mouth 2 (two) times daily. 65mg      fluticasone (FLONASE) 50 MCG/ACT nasal spray Place 2 sprays into both nostrils daily. 16 g 6   latanoprost (XALATAN) 0.005 % ophthalmic solution Place 1 drop into both eyes at bedtime.      levothyroxine (SYNTHROID) 25 MCG tablet Take 1 tablet (25 mcg total) by mouth daily. 90 tablet 3   Naproxen Sodium (ALEVE PO) Take 2 tablets by mouth daily.      omeprazole (PRILOSEC) 40 MG capsule TAKE 1 CAPSULE(40 MG) BY MOUTH DAILY 90 capsule 1   Semaglutide (RYBELSUS) 7 MG TABS Take 7 mg by mouth daily. 90 tablet 3   No current facility-administered medications on file prior to visit.    Allergies  Allergen Reactions   Codeine Nausea Only    Severe nausea    Family History  Problem Relation Age of Onset   Dementia Mother    Breast cancer Mother 67   Kidney failure Mother 83       died from kidney failure from abx given to her for pneumonia   Hyperlipidemia Mother    Hypertension Mother    Diabetes Mother    Cancer Mother 77       breast   Parkinsonism Father    Diabetes Father 32   Hypertension Sister    Diabetes Brother    Hypertension Brother    Heart attack Brother    Heart disease Cousin 38       mi   Colon cancer Neg Hx     BP (!) 142/80 (BP  Location: Right Arm, Patient Position: Sitting, Cuff Size: Normal)   Pulse 79  Ht 5\' 6"  (1.676 m)   Wt 151 lb 3.2 oz (68.6 kg)   SpO2 95%   BMI 24.40 kg/m   Review of Systems She denies hypoglycemia    Objective:   Physical Exam Pulses: dorsalis pedis intact bilat.   MSK: no deformity of the feet CV: no leg edema Skin:  no ulcer on the feet.  normal color and temp on the feet. Neuro: sensation is intact to touch on the feet.    Lab Results  Component Value Date   HGBA1C 5.9 (A) 03/22/2021      Assessment & Plan:  Type 2 DM: overcontrolled.    Patient Instructions  I have sent a prescription to your pharmacy, to reduce the repaglinide Please continue the same other 4 diabetes medications.  check your blood sugar once a day.  vary the time of day when you check, between before the 3 meals, and at bedtime.  also check if you have symptoms of your blood sugar being too high or too low.  please keep a record of the readings and bring it to your next appointment here (or you can bring the meter itself).  You can write it on any piece of paper.  please call us sooner if your blood sugar goes below 70, or if you have a lot of readings over 200.  Please come back for a follow-up appointment in 4 months.

## 2021-03-22 NOTE — Patient Instructions (Addendum)
I have sent a prescription to your pharmacy, to reduce the repaglinide Please continue the same other 4 diabetes medications.  check your blood sugar once a day.  vary the time of day when you check, between before the 3 meals, and at bedtime.  also check if you have symptoms of your blood sugar being too high or too low.  please keep a record of the readings and bring it to your next appointment here (or you can bring the meter itself).  You can write it on any piece of paper.  please call us sooner if your blood sugar goes below 70, or if you have a lot of readings over 200.  Please come back for a follow-up appointment in 4 months.

## 2021-03-25 DIAGNOSIS — I10 Essential (primary) hypertension: Secondary | ICD-10-CM | POA: Diagnosis not present

## 2021-04-06 ENCOUNTER — Telehealth: Payer: Medicare Other

## 2021-04-06 ENCOUNTER — Telehealth: Payer: Self-pay | Admitting: Pharmacist

## 2021-04-06 NOTE — Telephone Encounter (Signed)
Notified patient Rybelsus from patient assistance program was delivered to our office. Ready for her to pick up at her convenience. States she will likely pick up either tomorrow or Friday.

## 2021-04-12 ENCOUNTER — Telehealth: Payer: Self-pay | Admitting: Pharmacist

## 2021-04-12 NOTE — Chronic Care Management (AMB) (Signed)
Chronic Care Management Pharmacy Assistant   Name: Alyssa Ford  MRN: [id number] DOB: 12-Jan-1951  Reason for Encounter: Disease State General  Recent office visits:  None noted  Recent consult visits:  03/22/21-(Endocrinology) Renato Shin, MD. Diabetic follow up. Decrease Repaglinide 2 mg take 1 tab 3 times daily to twice daily.  Hospital visits:  None in previous 6 months  Medications: Outpatient Encounter Medications as of 04/12/2021  Medication Sig Note   acarbose (PRECOSE) 50 MG tablet TAKE 1 TABLET(50 MG) BY MOUTH THREE TIMES DAILY WITH MEALS    amitriptyline (ELAVIL) 25 MG tablet TAKE 2 TABLETS(50 MG) BY MOUTH DAILY    aspirin EC 81 MG tablet Take 81 mg by mouth daily.    azelastine (ASTELIN) 0.1 % nasal spray Place 1 spray into both nostrils 2 (two) times daily. Per ENT    BIOTIN PO Take 1 tablet by mouth daily.     bromocriptine (PARLODEL) 2.5 MG tablet TAKE 1 TABLET(2.5 MG) BY MOUTH DAILY    Coenzyme Q10 (CO Q 10 PO) Take 1 capsule by mouth daily.    COVID-19 mRNA vaccine, Pfizer, 30 MCG/0.3ML injection INJECT AS DIRECTED    diazepam (VALIUM) 5 MG tablet TAKE 1 TABLET BY MOUTH EVERY NIGHT AT BEDTIME AS NEEDED    fenofibrate 160 MG tablet TAKE 1 TABLET(160 MG) BY MOUTH DAILY    Ferrous Sulfate (IRON SUPPLEMENT PO) Take 1 tablet by mouth 2 (two) times daily. 65mg     fluticasone (FLONASE) 50 MCG/ACT nasal spray Place 2 sprays into both nostrils daily.    latanoprost (XALATAN) 0.005 % ophthalmic solution Place 1 drop into both eyes at bedtime.     levothyroxine (SYNTHROID) 25 MCG tablet Take 1 tablet (25 mcg total) by mouth daily.    lisinopril (ZESTRIL) 5 MG tablet Take 1 tablet (5 mg total) by mouth daily.    metFORMIN (GLUCOPHAGE-XR) 500 MG 24 hr tablet TAKE 4 TABLETS(2000 MG) BY MOUTH DAILY WITH BREAKFAST    Naproxen Sodium (ALEVE PO) Take 2 tablets by mouth daily.  01/18/2021: Taking as needed   omeprazole (PRILOSEC) 40 MG capsule TAKE 1 CAPSULE(40 MG) BY MOUTH  DAILY    repaglinide (PRANDIN) 2 MG tablet Take 1 tablet (2 mg total) by mouth 2 (two) times daily before a meal.    rosuvastatin (CRESTOR) 40 MG tablet TAKE 1 TABLET(40 MG) BY MOUTH AT BEDTIME    Semaglutide (RYBELSUS) 7 MG TABS Take 7 mg by mouth daily. 01/10/2021: Receiving from [facility] thru 05/2021   No facility-administered encounter medications on file as of 04/12/2021.   Have you had any problems recently with your health? Patient states she has tried melatonin for about a week and has not helped after the 2nd week she had taken it to help her sleep.  Have you had any problems with your pharmacy? Patient states she has no problems with her pharmacy.  What issues or side effects are you having with your medications? Patient states she has no issues or side effects to her medications.  What would you like me to pass along to [place] for them to help you with?  Patient states she has tried melatonin for about a week and has not helped after the 2nd week she had taken it to help her sleep. Patient also states she has received another four months worth for Rybelsus and I recommended for her and reminded her to complete the patient assistance application for the up coming year that the  CPP has at the office, patient is aware and will pick it up at the office.  What can we do to take care of you better? Patient states there is nothing at this time.   Star Rating Drugs: Rosuvastatin 40 mg Last filled:03/22/21 90 DS Repaglinide 2 mg  Last filled:03/22/21 90 DS Semaglutide 7 mg Last filled:12/08/20 30 DS  Myriam Elta [place], [place]

## 2021-04-25 ENCOUNTER — Telehealth: Payer: Self-pay

## 2021-04-25 NOTE — Telephone Encounter (Signed)
Received via [employer] Patient Assistance Program forms for Rybelsus.  Forms put in PCP's box for pick up by CMA.

## 2021-04-26 NOTE — Telephone Encounter (Signed)
Received. Partially filled out. Placed in quick sign folder

## 2021-04-27 ENCOUNTER — Encounter: Payer: Self-pay | Admitting: Gastroenterology

## 2021-04-27 ENCOUNTER — Ambulatory Visit (AMBULATORY_SURGERY_CENTER): Payer: Medicare Other | Admitting: *Deleted

## 2021-04-27 VITALS — Ht 66.0 in | Wt 150.0 lb

## 2021-04-27 DIAGNOSIS — Z86010 Personal history of colonic polyps: Secondary | ICD-10-CM

## 2021-04-27 MED ORDER — PLENVU 140 G PO SOLR: 1.0000 | Freq: Once | ORAL | 0 refills | Status: AC

## 2021-04-27 NOTE — Telephone Encounter (Signed)
Forms have been faxed 

## 2021-04-27 NOTE — Progress Notes (Signed)
No egg or soy allergy known to patient  No issues known to pt with past sedation with any surgeries or procedures Patient denies ever being told they had issues or difficulty with intubation  No FH of Malignant Hyperthermia Pt is not on diet pills Pt is not on  home 02  Pt is not on blood thinners  Pt given 2 day prep for daily constipation No A fib or A flutter  Pt is fully vaccinated  for Covid   Plenvu  Coupon given to pt in PV today , Code to Pharmacy and  NO PA's for preps discussed with pt In PV today  Discussed with pt there will be an out-of-pocket cost for prep and that varies from $0 to 70 +  dollars - pt verbalized understanding   Due to the COVID-19 pandemic we are asking patients to follow certain guidelines in PV and the [place]   Pt aware of COVID protocols and [facility] guidelines

## 2021-04-30 ENCOUNTER — Encounter (HOSPITAL_BASED_OUTPATIENT_CLINIC_OR_DEPARTMENT_OTHER): Payer: Self-pay | Admitting: *Deleted

## 2021-04-30 ENCOUNTER — Emergency Department (HOSPITAL_BASED_OUTPATIENT_CLINIC_OR_DEPARTMENT_OTHER)
Admission: EM | Admit: 2021-04-30 | Discharge: 2021-04-30 | Disposition: A | Discharge status: Home / Self Care | Payer: Medicare Other | Attending: Emergency Medicine | Admitting: Emergency Medicine

## 2021-04-30 ENCOUNTER — Emergency Department (HOSPITAL_BASED_OUTPATIENT_CLINIC_OR_DEPARTMENT_OTHER): Payer: Medicare Other

## 2021-04-30 ENCOUNTER — Other Ambulatory Visit: Payer: Self-pay

## 2021-04-30 DIAGNOSIS — R3915 Urgency of urination: Secondary | ICD-10-CM | POA: Diagnosis not present

## 2021-04-30 DIAGNOSIS — I10 Essential (primary) hypertension: Secondary | ICD-10-CM | POA: Diagnosis not present

## 2021-04-30 DIAGNOSIS — R319 Hematuria, unspecified: Secondary | ICD-10-CM | POA: Diagnosis not present

## 2021-04-30 DIAGNOSIS — N200 Calculus of kidney: Secondary | ICD-10-CM | POA: Diagnosis not present

## 2021-04-30 DIAGNOSIS — E039 Hypothyroidism, unspecified: Secondary | ICD-10-CM | POA: Diagnosis not present

## 2021-04-30 DIAGNOSIS — R350 Frequency of micturition: Secondary | ICD-10-CM | POA: Insufficient documentation

## 2021-04-30 DIAGNOSIS — Z79899 Other long term (current) drug therapy: Secondary | ICD-10-CM | POA: Insufficient documentation

## 2021-04-30 DIAGNOSIS — Z7984 Long term (current) use of oral hypoglycemic drugs: Secondary | ICD-10-CM | POA: Diagnosis not present

## 2021-04-30 DIAGNOSIS — Z7982 Long term (current) use of aspirin: Secondary | ICD-10-CM | POA: Diagnosis not present

## 2021-04-30 DIAGNOSIS — E119 Type 2 diabetes mellitus without complications: Secondary | ICD-10-CM | POA: Insufficient documentation

## 2021-04-30 DIAGNOSIS — Z87891 Personal history of nicotine dependence: Secondary | ICD-10-CM | POA: Diagnosis not present

## 2021-04-30 DIAGNOSIS — E1159 Type 2 diabetes mellitus with other circulatory complications: Secondary | ICD-10-CM | POA: Diagnosis not present

## 2021-04-30 LAB — CBC WITH DIFFERENTIAL/PLATELET
Abs Immature Granulocytes: 0.03 10*3/uL (ref 0.00–0.07)
Basophils Absolute: 0.1 10*3/uL (ref 0.0–0.1)
Basophils Relative: 0 %
Eosinophils Absolute: 0.2 10*3/uL (ref 0.0–0.5)
Eosinophils Relative: 1 %
HCT: 38.4 % (ref 36.0–46.0)
Hemoglobin: 12.5 g/dL (ref 12.0–15.0)
Immature Granulocytes: 0 %
Lymphocytes Relative: 27 %
Lymphs Abs: 3.3 10*3/uL (ref 0.7–4.0)
MCH: 29 pg (ref 26.0–34.0)
MCHC: 32.6 g/dL (ref 30.0–36.0)
MCV: 89.1 fL (ref 80.0–100.0)
Monocytes Absolute: 0.7 10*3/uL (ref 0.1–1.0)
Monocytes Relative: 6 %
Neutro Abs: 7.9 10*3/uL — ABNORMAL HIGH (ref 1.7–7.7)
Neutrophils Relative %: 66 %
Platelets: 247 10*3/uL (ref 150–400)
RBC: 4.31 MIL/uL (ref 3.87–5.11)
RDW: 13.5 % (ref 11.5–15.5)
WBC: 12.2 10*3/uL — ABNORMAL HIGH (ref 4.0–10.5)
nRBC: 0 % (ref 0.0–0.2)

## 2021-04-30 LAB — BASIC METABOLIC PANEL
Anion gap: 10 (ref 5–15)
BUN: 12 mg/dL (ref 8–23)
CO2: 27 mmol/L (ref 22–32)
Calcium: 9.7 mg/dL (ref 8.9–10.3)
Chloride: 104 mmol/L (ref 98–111)
Creatinine, Ser: 0.88 mg/dL (ref 0.44–1.00)
GFR, Estimated: >60 mL/min (ref >60)
Glucose, Bld: 112 mg/dL — ABNORMAL HIGH (ref 70–99)
Potassium: 4 mmol/L (ref 3.5–5.1)
Sodium: 141 mmol/L (ref 135–145)

## 2021-04-30 LAB — URINALYSIS, MICROSCOPIC (REFLEX): RBC / HPF: >50 RBC/hpf (ref 0–5)

## 2021-04-30 LAB — URINALYSIS, ROUTINE W REFLEX MICROSCOPIC: Specific Gravity, Urine: <1.005 — ABNORMAL LOW (ref 1.005–1.030)

## 2021-04-30 MED ORDER — CEPHALEXIN 500 MG PO CAPS: 500.0000 mg | ORAL_CAPSULE | Freq: Four times a day (QID) | ORAL | 0 refills | Status: DC

## 2021-04-30 MED ORDER — CEPHALEXIN 250 MG PO CAPS
250.0000 mg | ORAL_CAPSULE | Freq: Once | ORAL | Status: AC
  Administered 2021-04-30: 250 mg via ORAL
  Filled 2021-04-30: qty 1

## 2021-04-30 NOTE — ED Triage Notes (Addendum)
Bloody urine started after lunch.  Verbalized pressure to bladder and vaginal area.  Patient is not on blood thinner.

## 2021-04-30 NOTE — ED Provider Notes (Signed)
[facility] EMERGENCY DEPT Provider Note   CSN: [id number] Arrival date & time: 04/30/21  1803     History Chief Complaint  Patient presents with   Hematuria    Alyssa Ford is a 70 y.o. female.   Hematuria Pertinent negatives include no chest pain and no shortness of breath.   Patient presents with hematuria.  Started acutely at 4 PM today, has been increasing in amount of blood since then.  No history of the same, states that she feels her urinary urgency and frequency.  Has not tried any medicine for yet, no history of the same.  Symptoms have been constant, has not been alleviated by anything.  Denies any pain or nausea or vomiting.  Past Medical History:  Diagnosis Date   Anemia    Anxiety    Arthritis    Cataract    Chronic headaches    Depression    Diabetes mellitus    Diverticulosis of colon (without mention of hemorrhage) 2003, 2008   Colonoscopy   GERD (gastroesophageal reflux disease)    Glaucoma    Hiatal hernia 2003   EGD    Hyperlipidemia    Hypertension    Thyroid nodule     Patient Active Problem List   Diagnosis Date Noted   Hypothyroidism 08/24/2020   Chest pain 08/06/2020   Trichimoniasis 04/27/2020   Cervical cancer screening 04/27/2020   Left thyroid nodule 04/13/2020   Vaginal odor 01/11/2020   Icterus 01/11/2020   Uncontrolled type 2 diabetes mellitus with hyperglycemia ([place]) 09/22/2019   De Quervain's disease (tenosynovitis) 09/22/2019   Neck mass 07/11/2018   Abnormal WBC count 05/29/2017   Skin fissure 09/24/2015   Allergic rhinitis 03/26/2014   Wheezing 03/26/2014   DM (diabetes mellitus) type II uncontrolled, periph vascular disorder 08/25/2011   Headache(784.0) 08/21/2011   Acute sinusitis 04/26/2010   URI 04/26/2010   UNSPECIFIED CATARACT 02/23/2010   POSTMENOPAUSAL STATUS 12/12/2007   FATIGUE 09/11/2007   THYROID NODULE 11/22/2006   Diabetes ([place]) 11/22/2006   Hyperlipidemia LDL goal <70 11/22/2006    GLUCOMA 11/22/2006   Essential hypertension 11/22/2006   GERD 11/22/2006    Past Surgical History:  Procedure Laterality Date   CARPAL TUNNEL RELEASE  2006   CARPAL TUNNEL RELEASE  12/24/2007   CATARACT EXTRACTION, BILATERAL     [place]---  sept and oct 2018   CERVICAL FUSION  3/08   CHOLECYSTECTOMY     FASCIECTOMY Left 08/01/2018   Procedure: FASCIECTOMY LEFT RING;  Surgeon: Daryll Brod, MD;  Location: [facility];  Service: Orthopedics;  Laterality: Left;  UPPER ARM   TRIGGER FINGER RELEASE Left 08/01/2018   Procedure: RELEASE TRIGGER FINGER/A-1 PULLEY [place];  Surgeon: Daryll Brod, MD;  Location: [facility];  Service: Orthopedics;  Laterality: Left;     OB History     Gravida  3   Para  3   Term      Preterm      AB      Living         SAB      IAB      Ectopic      Multiple      Live Births              Family History  Problem Relation Age of Onset   Dementia Mother    Breast cancer Mother 10   Kidney failure Mother 76       died from  kidney failure from abx given to her for pneumonia   Hyperlipidemia Mother    Hypertension Mother    Diabetes Mother    Cancer Mother 13       breast   Parkinsonism Father    Diabetes Father 95   Hypertension Sister    Diabetes Brother    Hypertension Brother    Heart attack Brother    Heart disease Cousin 16       mi   Colon cancer Neg Hx    Esophageal cancer Neg Hx    Stomach cancer Neg Hx     Social History   Tobacco Use   Smoking status: Former    Packs/day: 1.40    Years: 49.00    Pack years: 68.60    Types: Cigarettes    Quit date: 07/16/2015    Years since quitting: 5.7   Smokeless tobacco: Never   Tobacco comments:    Continues to vape but states pod do not contain nicotine  Vaping Use   Vaping Use: Every day  Substance Use Topics   Alcohol use: Yes    Alcohol/week: 0.0 standard drinks    Comment: once weekly; wine and beer   Drug use: No    Home  Medications Prior to Admission medications   Medication Sig Start Date End Date Taking? Authorizing Provider  acarbose (PRECOSE) 50 MG tablet TAKE 1 TABLET(50 MG) BY MOUTH THREE TIMES DAILY WITH MEALS 12/22/20  Yes Renato Shin, MD  amitriptyline (ELAVIL) 25 MG tablet TAKE 2 TABLETS(50 MG) BY MOUTH DAILY 03/22/21  Yes Roma Schanz R, DO  BIOTIN PO Take 1 tablet by mouth daily.    Yes [provider]  bromocriptine (PARLODEL) 2.5 MG tablet TAKE 1 TABLET(2.5 MG) BY MOUTH DAILY 12/22/20  Yes Renato Shin, MD  Coenzyme Q10 (CO Q 10 PO) Take 1 capsule by mouth daily.   Yes [provider]  fenofibrate 160 MG tablet TAKE 1 TABLET(160 MG) BY MOUTH DAILY 02/23/21  Yes Roma Schanz R, DO  Ferrous Sulfate (IRON SUPPLEMENT PO) Take 1 tablet by mouth daily. 65mg    Yes [provider]  latanoprost (XALATAN) 0.005 % ophthalmic solution Place 1 drop into both eyes at bedtime.  10/17/13  Yes [provider]  levothyroxine (SYNTHROID) 25 MCG tablet Take 1 tablet (25 mcg total) by mouth daily. 11/17/20  Yes Renato Shin, MD  lisinopril (ZESTRIL) 5 MG tablet Take 1 tablet (5 mg total) by mouth daily. 03/22/21  Yes Roma Schanz R, DO  metFORMIN (GLUCOPHAGE-XR) 500 MG 24 hr tablet TAKE 4 TABLETS(2000 MG) BY MOUTH DAILY WITH BREAKFAST 03/23/21  Yes Renato Shin, MD  omeprazole (PRILOSEC) 40 MG capsule TAKE 1 CAPSULE(40 MG) BY MOUTH DAILY 12/20/20  Yes Ann Held, DO  repaglinide (PRANDIN) 2 MG tablet Take 1 tablet (2 mg total) by mouth 2 (two) times daily before a meal. 03/22/21  Yes Renato Shin, MD  rosuvastatin (CRESTOR) 40 MG tablet TAKE 1 TABLET(40 MG) BY MOUTH AT BEDTIME 03/22/21  Yes Lowne Chase, Yvonne R, DO  Semaglutide (RYBELSUS) 7 MG TABS Take 7 mg by mouth daily. 08/24/20  Yes Renato Shin, MD  aspirin EC 81 MG tablet Take 81 mg by mouth daily.    Carollee Herter, Yvonne R, DO  azelastine (ASTELIN) 0.1 % nasal spray Place 1 spray into both nostrils 2  (two) times daily. Per ENT    [provider]  diazepam (VALIUM) 5 MG tablet TAKE 1 TABLET  BY MOUTH EVERY NIGHT AT BEDTIME AS NEEDED 01/13/21   Carollee Herter, Alferd Apa, DO  Naproxen Sodium (ALEVE PO) Take 2 tablets by mouth daily.     [provider]    Allergies    Codeine  Review of Systems   Review of Systems  Constitutional:  Negative for fever.  Respiratory:  Negative for chest tightness and shortness of breath.   Cardiovascular:  Negative for chest pain.  Genitourinary:  Positive for dysuria, hematuria and urgency. Negative for difficulty urinating, flank pain and vaginal pain.   Physical Exam Updated Vital Signs BP 139/86 (BP Location: Right Arm)   Pulse 85   Temp 97.7 F (36.5 C)   Resp 16   Ht 5\' 6"  (1.676 m)   Wt 68.5 kg   SpO2 99%   BMI 24.37 kg/m   Physical Exam Vitals and nursing note reviewed. Exam conducted with a chaperone present.  Constitutional:      Appearance: Normal appearance.  HENT:     Head: Normocephalic and atraumatic.  Eyes:     General: No scleral icterus.       Right eye: No discharge.        Left eye: No discharge.     Extraocular Movements: Extraocular movements intact.     Pupils: Pupils are equal, round, and reactive to light.  Cardiovascular:     Rate and Rhythm: Normal rate and regular rhythm.     Pulses: Normal pulses.     Heart sounds: Normal heart sounds. No murmur heard.   No friction rub. No gallop.  Pulmonary:     Effort: Pulmonary effort is normal. No respiratory distress.     Breath sounds: Normal breath sounds.  Abdominal:     General: Abdomen is flat. Bowel sounds are normal. There is no distension.     Palpations: Abdomen is soft.     Tenderness: There is no abdominal tenderness.     Comments: Abdomen soft and nontender, no CVA tenderness  Skin:    General: Skin is warm and dry.     Coloration: Skin is not jaundiced.  Neurological:     Mental Status: She is alert. Mental status is at baseline.      Coordination: Coordination normal.   ED Results / Procedures / Treatments   Labs (all labs ordered are listed, but only abnormal results are displayed) Labs Reviewed  URINE CULTURE  URINALYSIS, ROUTINE W REFLEX MICROSCOPIC  BASIC METABOLIC PANEL  CBC WITH DIFFERENTIAL/PLATELET    EKG None  Radiology No results found.  Procedures Procedures   Medications Ordered in ED Medications - No data to display  ED Course  I have reviewed the triage vital signs and the nursing notes.  Pertinent labs & imaging results that were available during my care of the patient were reviewed by me and considered in my medical decision making (see chart for details).    MDM Rules/Calculators/A&P                           Vital stable, patient nontoxic-appearing.  Abdominal exam benign, no peritoneal signs.  She does have a urine notable for gross hemoglobin.  Cultures pending, will treat empirically with antibiotics for UTI given her symptoms.  CT renal study does not show any nephrolithiasis, mild leukocytosis on W BC.  Vital signs are stable, no gross electrolyte derangement.  She does not have any pain or vomiting.  Discussed the work-up and differential  with the patient.  Advised antibiotics and follow-up with urology in the next week.  Patient discharged in stable position.  Do not think an emergent work-up is warranted at this time.  Final Clinical Impression(s) / ED Diagnoses Final diagnoses:  None    Rx / DC Orders ED Discharge Orders     None        Sherrill Raring, Hershal Coria 04/30/21 2159    Fredia Sorrow, MD 05/13/21 [phone]

## 2021-04-30 NOTE — Discharge Instructions (Addendum)
Take Keflex 4 times daily for the next 5 days.  Your first dose was given here in the emergency department today.  On the last day of taking the antibiotic you only take 3. The urine culture is still growing.  If you grow a infection that is not covered by the antibiotic you will receive a call from the hospital switch you to a different medication.  If you hear nothing please continue taking the Keflex. Follow-up with [facility] in the next week or so.  If your symptoms change or worsen please return back to the ED as needed for additional evaluation.

## 2021-05-02 LAB — URINE CULTURE: Culture: <10000 — AB

## 2021-05-11 ENCOUNTER — Other Ambulatory Visit: Payer: Self-pay

## 2021-05-11 ENCOUNTER — Ambulatory Visit (AMBULATORY_SURGERY_CENTER): Payer: Medicare Other | Admitting: Gastroenterology

## 2021-05-11 ENCOUNTER — Encounter: Payer: Self-pay | Admitting: Gastroenterology

## 2021-05-11 VITALS — BP 157/72 | HR 67 | Temp 97.9°F | Resp 15 | Ht 66.0 in | Wt 152.0 lb

## 2021-05-11 DIAGNOSIS — D124 Benign neoplasm of descending colon: Secondary | ICD-10-CM | POA: Diagnosis not present

## 2021-05-11 DIAGNOSIS — Z86010 Personal history of colonic polyps: Secondary | ICD-10-CM | POA: Diagnosis not present

## 2021-05-11 DIAGNOSIS — I10 Essential (primary) hypertension: Secondary | ICD-10-CM | POA: Diagnosis not present

## 2021-05-11 DIAGNOSIS — D122 Benign neoplasm of ascending colon: Secondary | ICD-10-CM

## 2021-05-11 MED ORDER — SODIUM CHLORIDE 0.9 % IV SOLN: 500.0000 mL | INTRAVENOUS | Status: DC

## 2021-05-11 NOTE — Progress Notes (Signed)
Report given to PACU, vss 

## 2021-05-11 NOTE — Progress Notes (Signed)
Previsit over the phone.  Vs by CW

## 2021-05-11 NOTE — Patient Instructions (Signed)
Resume previous diet and medications. Awaiting pathology results. Repeat Colonoscopy in 3 years for surveillance.  YOU HAD AN ENDOSCOPIC PROCEDURE TODAY AT [facility]:   Refer to the procedure report that was given to you for any specific questions about what was found during the examination.  If the procedure report does not answer your questions, please call your gastroenterologist to clarify.  If you requested that your care partner not be given the details of your procedure findings, then the procedure report has been included in a sealed envelope for you to review at your convenience later.  YOU SHOULD EXPECT: Some feelings of bloating in the abdomen. Passage of more gas than usual.  Walking can help get rid of the air that was put into your GI tract during the procedure and reduce the bloating. If you had a lower endoscopy (such as a colonoscopy or flexible sigmoidoscopy) you may notice spotting of blood in your stool or on the toilet paper. If you underwent a bowel prep for your procedure, you may not have a normal bowel movement for a few days.  Please Note:  You might notice some irritation and congestion in your nose or some drainage.  This is from the oxygen used during your procedure.  There is no need for concern and it should clear up in a day or so.  SYMPTOMS TO REPORT IMMEDIATELY:  Following lower endoscopy (colonoscopy or flexible sigmoidoscopy):  Excessive amounts of blood in the stool  Significant tenderness or worsening of abdominal pains  Swelling of the abdomen that is new, acute  Fever of 100F or higher  For urgent or emergent issues, a gastroenterologist can be reached at any hour by calling [phone]. Do not use MyChart messaging for urgent concerns.    DIET:  We do recommend a small meal at first, but then you may proceed to your regular diet.  Drink plenty of fluids but you should avoid alcoholic beverages for 24 hours.  ACTIVITY:  You should  plan to take it easy for the rest of today and you should NOT DRIVE or use heavy machinery until tomorrow (because of the sedation medicines used during the test).    FOLLOW UP: Our staff will call the number listed on your records 48-72 hours following your procedure to check on you and address any questions or concerns that you may have regarding the information given to you following your procedure. If we do not reach you, we will leave a message.  We will attempt to reach you two times.  During this call, we will ask if you have developed any symptoms of COVID 19. If you develop any symptoms (ie: fever, flu-like symptoms, shortness of breath, cough etc.) before then, please call [phone].  If you test positive for Covid 19 in the 2 weeks post procedure, please call and report this information to [place].    If any biopsies were taken you will be contacted by phone or by letter within the next 1-3 weeks.  Please call us at [phone] if you have not heard about the biopsies in 3 weeks.    SIGNATURES/CONFIDENTIALITY: You and/or your care partner have signed paperwork which will be entered into your electronic medical record.  These signatures attest to the fact that that the information above on your After Visit Summary has been reviewed and is understood.  Full responsibility of the confidentiality of this discharge information lies with you and/or your care-partner.

## 2021-05-11 NOTE — Progress Notes (Signed)
[facility] History and Physical   Primary Care Physician:  Ann Held, DO   Reason for Procedure:  History of adenomatous colon polyps  Plan:    Surveillance colonoscopy with possible interventions as needed     HPI: Alyssa Ford is a very pleasant 70 y.o. female here for surveillance colonoscopy. Denies any nausea, vomiting, abdominal pain, melena or bright red blood per rectum  The risks and benefits as well as alternatives of endoscopic procedure(s) have been discussed and reviewed. All questions answered. The patient agrees to proceed.    Past Medical History:  Diagnosis Date   Anemia    Anxiety    Arthritis    Cataract    Chronic headaches    Depression    Diabetes mellitus    Diverticulosis of colon (without mention of hemorrhage) 2003, 2008   Colonoscopy   GERD (gastroesophageal reflux disease)    Glaucoma    Hiatal hernia 2003   EGD    Hyperlipidemia    Hypertension    Thyroid nodule     Past Surgical History:  Procedure Laterality Date   CARPAL TUNNEL RELEASE  2006   CARPAL TUNNEL RELEASE  12/24/2007   CATARACT EXTRACTION, BILATERAL     [facility]---  sept and oct 2018   CERVICAL FUSION  3/08   CHOLECYSTECTOMY     FASCIECTOMY Left 08/01/2018   Procedure: FASCIECTOMY LEFT RING;  Surgeon: Daryll Brod, MD;  Location: [facility];  Service: Orthopedics;  Laterality: Left;  UPPER ARM   TRIGGER FINGER RELEASE Left 08/01/2018   Procedure: RELEASE TRIGGER FINGER/A-1 PULLEY [place];  Surgeon: Daryll Brod, MD;  Location: [facility];  Service: Orthopedics;  Laterality: Left;    Prior to Admission medications   Medication Sig Start Date End Date Taking? Authorizing Provider  acarbose (PRECOSE) 50 MG tablet TAKE 1 TABLET(50 MG) BY MOUTH THREE TIMES DAILY WITH MEALS 12/22/20  Yes Renato Shin, MD  amitriptyline (ELAVIL) 25 MG tablet TAKE 2 TABLETS(50 MG) BY MOUTH DAILY 03/22/21  Yes Ann Held, DO  aspirin  EC 81 MG tablet Take 81 mg by mouth daily.   Yes Carollee Herter, Yvonne R, DO  azelastine (ASTELIN) 0.1 % nasal spray Place 1 spray into both nostrils 2 (two) times daily. Per ENT   Yes [provider]  BIOTIN PO Take 1 tablet by mouth daily.    Yes [provider]  bromocriptine (PARLODEL) 2.5 MG tablet TAKE 1 TABLET(2.5 MG) BY MOUTH DAILY 12/22/20  Yes Renato Shin, MD  Coenzyme Q10 (CO Q 10 PO) Take 1 capsule by mouth daily.   Yes [provider]  diazepam (VALIUM) 5 MG tablet TAKE 1 TABLET BY MOUTH EVERY NIGHT AT BEDTIME AS NEEDED 01/13/21  Yes Roma Schanz R, DO  fenofibrate 160 MG tablet TAKE 1 TABLET(160 MG) BY MOUTH DAILY 02/23/21  Yes Roma Schanz R, DO  Ferrous Sulfate (IRON SUPPLEMENT PO) Take 1 tablet by mouth daily. 65mg    Yes [provider]  latanoprost (XALATAN) 0.005 % ophthalmic solution Place 1 drop into both eyes at bedtime.  10/17/13  Yes [provider]  levothyroxine (SYNTHROID) 25 MCG tablet Take 1 tablet (25 mcg total) by mouth daily. 11/17/20  Yes Renato Shin, MD  lisinopril (ZESTRIL) 5 MG tablet Take 1 tablet (5 mg total) by mouth daily. 03/22/21  Yes Roma Schanz R, DO  metFORMIN (GLUCOPHAGE-XR) 500 MG 24 hr tablet TAKE 4 TABLETS(2000 MG) BY  MOUTH DAILY WITH BREAKFAST 03/23/21  Yes Renato Shin, MD  Naproxen Sodium (ALEVE PO) Take 2 tablets by mouth daily.    Yes [provider]  omeprazole (PRILOSEC) 40 MG capsule TAKE 1 CAPSULE(40 MG) BY MOUTH DAILY 12/20/20  Yes Ann Held, DO  repaglinide (PRANDIN) 2 MG tablet Take 1 tablet (2 mg total) by mouth 2 (two) times daily before a meal. 03/22/21  Yes Renato Shin, MD  rosuvastatin (CRESTOR) 40 MG tablet TAKE 1 TABLET(40 MG) BY MOUTH AT BEDTIME 03/22/21  Yes Lowne Chase, Yvonne R, DO  Semaglutide (RYBELSUS) 7 MG TABS Take 7 mg by mouth daily. 08/24/20  Yes Renato Shin, MD    Current Outpatient Medications  Medication Sig Dispense Refill    acarbose (PRECOSE) 50 MG tablet TAKE 1 TABLET(50 MG) BY MOUTH THREE TIMES DAILY WITH MEALS 270 tablet 3   amitriptyline (ELAVIL) 25 MG tablet TAKE 2 TABLETS(50 MG) BY MOUTH DAILY 180 tablet 1   aspirin EC 81 MG tablet Take 81 mg by mouth daily.     azelastine (ASTELIN) 0.1 % nasal spray Place 1 spray into both nostrils 2 (two) times daily. Per ENT     BIOTIN PO Take 1 tablet by mouth daily.      bromocriptine (PARLODEL) 2.5 MG tablet TAKE 1 TABLET(2.5 MG) BY MOUTH DAILY 90 tablet 3   Coenzyme Q10 (CO Q 10 PO) Take 1 capsule by mouth daily.     diazepam (VALIUM) 5 MG tablet TAKE 1 TABLET BY MOUTH EVERY NIGHT AT BEDTIME AS NEEDED 30 tablet 1   fenofibrate 160 MG tablet TAKE 1 TABLET(160 MG) BY MOUTH DAILY 90 tablet 1   Ferrous Sulfate (IRON SUPPLEMENT PO) Take 1 tablet by mouth daily. 65mg      latanoprost (XALATAN) 0.005 % ophthalmic solution Place 1 drop into both eyes at bedtime.      levothyroxine (SYNTHROID) 25 MCG tablet Take 1 tablet (25 mcg total) by mouth daily. 90 tablet 3   lisinopril (ZESTRIL) 5 MG tablet Take 1 tablet (5 mg total) by mouth daily. 90 tablet 1   metFORMIN (GLUCOPHAGE-XR) 500 MG 24 hr tablet TAKE 4 TABLETS(2000 MG) BY MOUTH DAILY WITH BREAKFAST 360 tablet 0   Naproxen Sodium (ALEVE PO) Take 2 tablets by mouth daily.      omeprazole (PRILOSEC) 40 MG capsule TAKE 1 CAPSULE(40 MG) BY MOUTH DAILY 90 capsule 1   repaglinide (PRANDIN) 2 MG tablet Take 1 tablet (2 mg total) by mouth 2 (two) times daily before a meal. 180 tablet 3   rosuvastatin (CRESTOR) 40 MG tablet TAKE 1 TABLET(40 MG) BY MOUTH AT BEDTIME 90 tablet 1   Semaglutide (RYBELSUS) 7 MG TABS Take 7 mg by mouth daily. 90 tablet 3   Current Facility-Administered Medications  Medication Dose Route Frequency Provider Last Rate Last Admin   0.9 %  sodium chloride infusion  500 mL Intravenous Continuous Dorsie Burich, Venia Minks, MD        Allergies as of 05/11/2021 - Review Complete 05/11/2021  Allergen Reaction Noted    Codeine Nausea Only 01/18/2012    Family History  Problem Relation Age of Onset   Dementia Mother    Breast cancer Mother 37   Kidney failure Mother 70       died from kidney failure from abx given to her for pneumonia   Hyperlipidemia Mother    Hypertension Mother    Diabetes Mother    Cancer Mother 34  breast   Parkinsonism Father    Diabetes Father 65   Hypertension Sister    Diabetes Brother    Hypertension Brother    Heart attack Brother    Heart disease Cousin 70       mi   Colon cancer Neg Hx    Esophageal cancer Neg Hx    Stomach cancer Neg Hx     Social History   Socioeconomic History   Marital status: Married    Spouse name: Not on file   Number of children: Not on file   Years of education: Not on file   Highest education level: Not on file  Occupational History   Occupation: retired  Tobacco Use   Smoking status: Former    Packs/day: 1.40    Years: 49.00    Pack years: 68.60    Types: Cigarettes    Quit date: 07/16/2015    Years since quitting: 5.8   Smokeless tobacco: Never   Tobacco comments:    Continues to vape but states pod do not contain nicotine  Vaping Use   Vaping Use: Every day  Substance and Sexual Activity   Alcohol use: Yes    Alcohol/week: 0.0 standard drinks    Comment: once weekly; wine and beer   Drug use: No   Sexual activity: Not Currently    Partners: Male  Other Topics Concern   Not on file  Social History Narrative   Exercise--  walk   Social Determinants of Health   Financial Resource Strain: Low Risk    Difficulty of Paying Living Expenses: Not hard at all  Food Insecurity: No Food Insecurity   Worried About Charity fundraiser in the Last Year: Never true   [place] in the Last Year: Never true  Transportation Needs: No Transportation Needs   Lack of Transportation (Medical): No   Lack of Transportation (Non-Medical): No  Physical Activity: Insufficiently Active   Days of Exercise per Week: 4  days   Minutes of Exercise per Session: 20 min  Stress: No Stress Concern Present   Feeling of Stress : Only a little  Social Connections: Moderately Isolated   Frequency of Communication with Friends and Family: More than three times a week   Frequency of Social Gatherings with Friends and Family: More than three times a week   Attends Religious Services: Never   Marine scientist or Organizations: No   Attends Music therapist: Never   Marital Status: Married  Human resources officer Violence: Not At Risk   Fear of Current or Ex-Partner: No   Emotionally Abused: No   Physically Abused: No   Sexually Abused: No    Review of Systems:  All other review of systems negative except as mentioned in the HPI.  Physical Exam: Vital signs in last 24 hours: BP (!) 160/87   Pulse 84   Temp 97.9 F (36.6 C)   Resp 10   Ht 5\' 6"  (1.676 m)   Wt 152 lb (68.9 kg)   SpO2 99%   BMI 24.53 kg/m     General:   Alert, NAD Lungs:  Clear .   Heart:  Regular rate and rhythm Abdomen:  Soft, nontender and nondistended. Neuro/Psych:  Alert and cooperative. Normal mood and affect. A and O x 3  Reviewed labs, radiology imaging, old records and pertinent past GI work up  Patient is appropriate for planned procedure(s) and anesthesia in an ambulatory setting  Damaris Hippo , MD [phone]

## 2021-05-11 NOTE — Op Note (Signed)
[facility] Patient Name: Alyssa Ford Procedure Date: 05/11/2021 7:33 AM MRN: [id number] Endoscopist: Mauri Pole , MD Age: 70 Referring MD:  Date of Birth: 1950/07/18 Gender: Female Account #: [id number] Procedure:                Colonoscopy Indications:              High risk colon cancer surveillance: Personal                            history of colonic polyps, High risk colon cancer                            surveillance: Personal history of adenoma (10 mm or                            greater in size) Medicines:                Monitored Anesthesia Care Procedure:                Pre-Anesthesia Assessment:                           - Prior to the procedure, a History and Physical                            was performed, and patient medications and                            allergies were reviewed. The patient's tolerance of                            previous anesthesia was also reviewed. The risks                            and benefits of the procedure and the sedation                            options and risks were discussed with the patient.                            All questions were answered, and informed consent                            was obtained. Prior Anticoagulants: The patient has                            taken no previous anticoagulant or antiplatelet                            agents. ASA Grade Assessment: II - A patient with                            mild systemic disease. After reviewing the risks  and benefits, the patient was deemed in                            satisfactory condition to undergo the procedure.                           After obtaining informed consent, the colonoscope                            was passed under direct vision. Throughout the                            procedure, the patient's blood pressure, pulse, and                            oxygen saturations were monitored  continuously. The                            Olympus PCF-H190DL ([id number]) Colonoscope was                            introduced through the anus and advanced to the the                            cecum, identified by appendiceal orifice and                            ileocecal valve. The colonoscopy was somewhat                            difficult due to a redundant colon, significant                            looping, a tortuous colon and the patient's body                            habitus. Successful completion of the procedure was                            aided by applying abdominal pressure. The patient                            tolerated the procedure well. The quality of the                            bowel preparation was good. The ileocecal valve,                            appendiceal orifice, and rectum were photographed. Scope In: 8:12:58 AM Scope Out: 8:53:10 AM Scope Withdrawal Time: 0 hours 10 minutes 29 seconds  Total Procedure Duration: 0 hours 40 minutes 12 seconds  Findings:                 The perianal and digital rectal examinations were  normal.                           A 18 mm polyp was found in the ascending colon. The                            polyp was granular lateral spreading. The polyp was                            removed with a piecemeal technique using a cold                            snare. Resection and retrieval were complete.                           A 5 mm polyp was found in the descending colon. The                            polyp was sessile. The polyp was removed with a                            cold snare. Resection and retrieval were complete.                           Non-bleeding external and internal hemorrhoids were                            found during retroflexion. The hemorrhoids were                            medium-sized. Complications:            No immediate complications. Estimated Blood  Loss:     Estimated blood loss was minimal. Impression:               - One 18 mm polyp in the ascending colon, removed                            piecemeal using a cold snare. Resected and                            retrieved.                           - One 5 mm polyp in the descending colon, removed                            with a cold snare. Resected and retrieved.                           - Non-bleeding external and internal hemorrhoids. Recommendation:           - Patient has a contact number available for  emergencies. The signs and symptoms of potential                            delayed complications were discussed with the                            patient. Return to normal activities tomorrow.                            Written discharge instructions were provided to the                            patient.                           - Resume previous diet.                           - Continue present medications.                           - Await pathology results.                           - Repeat colonoscopy in 3 years for surveillance                            based on pathology results. Mauri Pole, MD 05/11/2021 9:01:13 AM This report has been signed electronically.

## 2021-05-11 NOTE — Progress Notes (Signed)
Called to room to assist during endoscopic procedure.  Patient ID and intended procedure confirmed with present staff. Received instructions for my participation in the procedure from the performing physician.  

## 2021-05-13 ENCOUNTER — Ambulatory Visit (INDEPENDENT_AMBULATORY_CARE_PROVIDER_SITE_OTHER): Payer: Medicare Other | Admitting: Family Medicine

## 2021-05-13 ENCOUNTER — Other Ambulatory Visit: Payer: Self-pay

## 2021-05-13 ENCOUNTER — Encounter: Payer: Self-pay | Admitting: Family Medicine

## 2021-05-13 ENCOUNTER — Telehealth: Payer: Self-pay

## 2021-05-13 VITALS — BP 120/70 | HR 81 | Temp 98.7°F | Resp 18 | Ht 66.0 in | Wt 151.2 lb

## 2021-05-13 DIAGNOSIS — G4700 Insomnia, unspecified: Secondary | ICD-10-CM | POA: Diagnosis not present

## 2021-05-13 DIAGNOSIS — N390 Urinary tract infection, site not specified: Secondary | ICD-10-CM

## 2021-05-13 DIAGNOSIS — R319 Hematuria, unspecified: Secondary | ICD-10-CM | POA: Diagnosis not present

## 2021-05-13 LAB — POC URINALSYSI DIPSTICK (AUTOMATED)
Bilirubin, UA: NEGATIVE
Glucose, UA: NEGATIVE
Ketones, UA: NEGATIVE
Nitrite, UA: POSITIVE
Protein, UA: POSITIVE — AB
Spec Grav, UA: 1.01 (ref 1.010–1.025)
Urobilinogen, UA: 0.2 E.U./dL
pH, UA: 5 (ref 5.0–8.0)

## 2021-05-13 MED ORDER — CIPROFLOXACIN HCL 500 MG PO TABS: 500.0000 mg | ORAL_TABLET | Freq: Two times a day (BID) | ORAL | 0 refills | Status: AC

## 2021-05-13 MED ORDER — TRAZODONE HCL 50 MG PO TABS
25.0000 mg | ORAL_TABLET | Freq: Every evening | ORAL | 3 refills | Status: DC | PRN
Start: 2021-05-13 — End: 2021-07-04

## 2021-05-13 NOTE — Progress Notes (Signed)
Subjective:   By signing my name below, I, Zite Okoli, attest that this documentation has been prepared under the direction and in the presence of Ann Held, DO. 05/13/2021     Patient ID: Alyssa Ford, female    DOB: 25-Oct-1950, 70 y.o.   MRN: [id number]  Chief Complaint  Patient presents with   ED visit follow up    Pt states urine is still cloudy. Pt states no more urgency.    HPI Patient is in today for an office visit and an ED f/u from 04/30/2021. She presented with hematuria and dysuria and was referred to a urologist.  Today, she reports that her urine is still cloudy. The frequency, urgency and hematuria have resolved after taking cephalexin. Denies fevers, dysuria and chills.  She also reports she has not been sleeping. She has also been sleeping for about 4 hours and has tried melatonin which does not help. She only sleeps normally for about 2 days every week. She is still using 25 mg elavil and rarely takes 5 mg valium. She adds that the valium does not help her to sleep. She thinks she snores and she does not move around in her sleep.   Urinalysis done at this visit is positive for UTI.  She has received the flu vaccine. She has 5 Pfizer Covid-19 vaccines.    Past Medical History:  Diagnosis Date   Anemia    Anxiety    Arthritis    Cataract    Chronic headaches    Depression    Diabetes mellitus    Diverticulosis of colon (without mention of hemorrhage) 2003, 2008   Colonoscopy   GERD (gastroesophageal reflux disease)    Glaucoma    Hiatal hernia 2003   EGD    Hyperlipidemia    Hypertension    Thyroid nodule     Past Surgical History:  Procedure Laterality Date   CARPAL TUNNEL RELEASE  2006   CARPAL TUNNEL RELEASE  12/24/2007   CATARACT EXTRACTION, BILATERAL     [facility]---  sept and oct 2018   CERVICAL FUSION  3/08   CHOLECYSTECTOMY     FASCIECTOMY Left 08/01/2018   Procedure: FASCIECTOMY LEFT RING;  Surgeon: Daryll Brod, MD;  Location:  [facility];  Service: Orthopedics;  Laterality: Left;  UPPER ARM   TRIGGER FINGER RELEASE Left 08/01/2018   Procedure: RELEASE TRIGGER FINGER/A-1 PULLEY [place];  Surgeon: Daryll Brod, MD;  Location: [facility];  Service: Orthopedics;  Laterality: Left;    Family History  Problem Relation Age of Onset   Dementia Mother    Breast cancer Mother 94   Kidney failure Mother 75       died from kidney failure from abx given to her for pneumonia   Hyperlipidemia Mother    Hypertension Mother    Diabetes Mother    Cancer Mother 74       breast   Parkinsonism Father    Diabetes Father 5   Hypertension Sister    Diabetes Brother    Hypertension Brother    Heart attack Brother    Heart disease Cousin 23       mi   Colon cancer Neg Hx    Esophageal cancer Neg Hx    Stomach cancer Neg Hx     Social History   Socioeconomic History   Marital status: Married    Spouse name: Not on file   Number of children: Not on file  Years of education: Not on file   Highest education level: Not on file  Occupational History   Occupation: retired  Tobacco Use   Smoking status: Former    Packs/day: 1.40    Years: 49.00    Pack years: 68.60    Types: Cigarettes    Quit date: 07/16/2015    Years since quitting: 5.8   Smokeless tobacco: Never   Tobacco comments:    Continues to vape but states pod do not contain nicotine  Vaping Use   Vaping Use: Every day  Substance and Sexual Activity   Alcohol use: Yes    Alcohol/week: 0.0 standard drinks    Comment: once weekly; wine and beer   Drug use: No   Sexual activity: Not Currently    Partners: Male  Other Topics Concern   Not on file  Social History Narrative   Exercise--  walk   Social Determinants of Health   Financial Resource Strain: Low Risk    Difficulty of Paying Living Expenses: Not hard at all  Food Insecurity: No Food Insecurity   Worried About Charity fundraiser in the Last Year: Never true    [place] in the Last Year: Never true  Transportation Needs: No Transportation Needs   Lack of Transportation (Medical): No   Lack of Transportation (Non-Medical): No  Physical Activity: Insufficiently Active   Days of Exercise per Week: 4 days   Minutes of Exercise per Session: 20 min  Stress: No Stress Concern Present   Feeling of Stress : Only a little  Social Connections: Moderately Isolated   Frequency of Communication with Friends and Family: More than three times a week   Frequency of Social Gatherings with Friends and Family: More than three times a week   Attends Religious Services: Never   Marine scientist or Organizations: No   Attends Music therapist: Never   Marital Status: Married  Human resources officer Violence: Not At Risk   Fear of Current or Ex-Partner: No   Emotionally Abused: No   Physically Abused: No   Sexually Abused: No    Outpatient Medications Prior to Visit  Medication Sig Dispense Refill   acarbose (PRECOSE) 50 MG tablet TAKE 1 TABLET(50 MG) BY MOUTH THREE TIMES DAILY WITH MEALS 270 tablet 3   aspirin EC 81 MG tablet Take 81 mg by mouth daily.     azelastine (ASTELIN) 0.1 % nasal spray Place 1 spray into both nostrils 2 (two) times daily. Per ENT     BIOTIN PO Take 1 tablet by mouth daily.      bromocriptine (PARLODEL) 2.5 MG tablet TAKE 1 TABLET(2.5 MG) BY MOUTH DAILY 90 tablet 3   Coenzyme Q10 (CO Q 10 PO) Take 1 capsule by mouth daily.     fenofibrate 160 MG tablet TAKE 1 TABLET(160 MG) BY MOUTH DAILY 90 tablet 1   Ferrous Sulfate (IRON SUPPLEMENT PO) Take 1 tablet by mouth daily. 65mg      latanoprost (XALATAN) 0.005 % ophthalmic solution Place 1 drop into both eyes at bedtime.      levothyroxine (SYNTHROID) 25 MCG tablet Take 1 tablet (25 mcg total) by mouth daily. 90 tablet 3   lisinopril (ZESTRIL) 5 MG tablet Take 1 tablet (5 mg total) by mouth daily. 90 tablet 1   metFORMIN (GLUCOPHAGE-XR) 500 MG 24 hr tablet TAKE 4  TABLETS(2000 MG) BY MOUTH DAILY WITH BREAKFAST 360 tablet 0   Naproxen Sodium (ALEVE PO) Take 2  tablets by mouth daily.      omeprazole (PRILOSEC) 40 MG capsule TAKE 1 CAPSULE(40 MG) BY MOUTH DAILY 90 capsule 1   repaglinide (PRANDIN) 2 MG tablet Take 1 tablet (2 mg total) by mouth 2 (two) times daily before a meal. 180 tablet 3   rosuvastatin (CRESTOR) 40 MG tablet TAKE 1 TABLET(40 MG) BY MOUTH AT BEDTIME 90 tablet 1   Semaglutide (RYBELSUS) 7 MG TABS Take 7 mg by mouth daily. 90 tablet 3   amitriptyline (ELAVIL) 25 MG tablet TAKE 2 TABLETS(50 MG) BY MOUTH DAILY 180 tablet 1   diazepam (VALIUM) 5 MG tablet TAKE 1 TABLET BY MOUTH EVERY NIGHT AT BEDTIME AS NEEDED 30 tablet 1   No facility-administered medications prior to visit.    Allergies  Allergen Reactions   Codeine Nausea Only    Severe nausea    Review of Systems  Constitutional:  Negative for fever.  HENT:  Negative for congestion, ear pain, hearing loss, sinus pain and sore throat.   Eyes:  Negative for blurred vision and pain.  Respiratory:  Negative for cough, sputum production, shortness of breath and wheezing.   Cardiovascular:  Negative for chest pain and palpitations.  Gastrointestinal:  Negative for blood in stool, constipation, diarrhea, nausea and vomiting.  Genitourinary:  Negative for dysuria, frequency, hematuria and urgency.       (+) cloudy urine   Musculoskeletal:  Negative for back pain, falls and myalgias.  Neurological:  Negative for dizziness, sensory change, loss of consciousness, weakness and headaches.  Endo/Heme/Allergies:  Negative for environmental allergies. Does not bruise/bleed easily.  Psychiatric/Behavioral:  Negative for depression and suicidal ideas. The patient has insomnia. The patient is not nervous/anxious.       Objective:    Physical Exam Constitutional:      General: She is not in acute distress.    Appearance: Normal appearance. She is not ill-appearing.  HENT:     Head:  Normocephalic and atraumatic.     Right Ear: External ear normal.     Left Ear: External ear normal.  Eyes:     Extraocular Movements: Extraocular movements intact.     Pupils: Pupils are equal, round, and reactive to light.  Cardiovascular:     Rate and Rhythm: Normal rate and regular rhythm.     Pulses: Normal pulses.     Heart sounds: Normal heart sounds. No murmur heard.   No gallop.  Pulmonary:     Effort: Pulmonary effort is normal. No respiratory distress.     Breath sounds: Normal breath sounds. No wheezing, rhonchi or rales.  Abdominal:     General: Bowel sounds are normal. There is no distension.     Palpations: Abdomen is soft. There is no mass.     Tenderness: There is no abdominal tenderness. There is no guarding or rebound.     Hernia: No hernia is present.  Musculoskeletal:     Cervical back: Normal range of motion and neck supple.  Lymphadenopathy:     Cervical: No cervical adenopathy.  Skin:    General: Skin is warm and dry.  Neurological:     Mental Status: She is alert and oriented to person, place, and time.  Psychiatric:        Behavior: Behavior normal.    BP 120/70 (BP Location: Left Arm, Patient Position: Sitting, Cuff Size: Normal)   Pulse 81   Temp 98.7 F (37.1 C) (Oral)   Resp 18   Ht 5\' 6"  (1.676  m)   Wt 151 lb 3.2 oz (68.6 kg)   SpO2 98%   BMI 24.40 kg/m  Wt Readings from Last 3 Encounters:  05/13/21 151 lb 3.2 oz (68.6 kg)  05/11/21 152 lb (68.9 kg)  04/30/21 151 lb (68.5 kg)    Diabetic Foot Exam - Simple   No data filed    Lab Results  Component Value Date   WBC 12.2 (H) 04/30/2021   HGB 12.5 04/30/2021   HCT 38.4 04/30/2021   PLT 247 04/30/2021   GLUCOSE 112 (H) 04/30/2021   CHOL 113 02/03/2021   TRIG 139.0 02/03/2021   HDL 39.50 02/03/2021   LDLDIRECT 87.0 06/07/2020   LDLCALC 46 02/03/2021   ALT 33 02/03/2021   AST 27 02/03/2021   NA 141 04/30/2021   K 4.0 04/30/2021   CL 104 04/30/2021   CREATININE 0.88  04/30/2021   BUN 12 04/30/2021   CO2 27 04/30/2021   TSH 3.66 02/03/2021   HGBA1C 5.9 (A) 03/22/2021   MICROALBUR 1.1 04/17/2019    Lab Results  Component Value Date   TSH 3.66 02/03/2021   Lab Results  Component Value Date   WBC 12.2 (H) 04/30/2021   HGB 12.5 04/30/2021   HCT 38.4 04/30/2021   MCV 89.1 04/30/2021   PLT 247 04/30/2021   Lab Results  Component Value Date   NA 141 04/30/2021   K 4.0 04/30/2021   CO2 27 04/30/2021   GLUCOSE 112 (H) 04/30/2021   BUN 12 04/30/2021   CREATININE 0.88 04/30/2021   BILITOT 0.4 02/03/2021   ALKPHOS 56 02/03/2021   AST 27 02/03/2021   ALT 33 02/03/2021   PROT 7.3 02/03/2021   ALBUMIN 4.5 02/03/2021   CALCIUM 9.7 04/30/2021   ANIONGAP 10 04/30/2021   GFR 73.31 02/03/2021   Lab Results  Component Value Date   CHOL 113 02/03/2021   Lab Results  Component Value Date   HDL 39.50 02/03/2021   Lab Results  Component Value Date   LDLCALC 46 02/03/2021   Lab Results  Component Value Date   TRIG 139.0 02/03/2021   Lab Results  Component Value Date   CHOLHDL 3 02/03/2021   Lab Results  Component Value Date   HGBA1C 5.9 (A) 03/22/2021       Assessment & Plan:   Problem List Items Addressed This Visit       Unprioritized   Insomnia - Primary    Refer to neuro---  R/o osa      Relevant Medications   traZODone (DESYREL) 50 MG tablet   Other Relevant Orders   Ambulatory referral to Neurology   Other Visit Diagnoses     Hematuria, unspecified type       Relevant Orders   POCT Urinalysis Dipstick (Automated) (Completed)   Urine Culture   Urinary tract infection with hematuria, site unspecified       Relevant Medications   ciprofloxacin (CIPRO) 500 MG tablet       Meds ordered this encounter  Medications   traZODone (DESYREL) 50 MG tablet    Sig: Take 0.5-1 tablets (25-50 mg total) by mouth at bedtime as needed for sleep.    Dispense:  30 tablet    Refill:  3   ciprofloxacin (CIPRO) 500 MG tablet     Sig: Take 1 tablet (500 mg total) by mouth 2 (two) times daily for 10 days.    Dispense:  20 tablet    Refill:  0  I,Zite Okoli,acting as a Education administrator for [employer], DO.,have documented all relevant documentation on the behalf of Ann Held, DO,as directed by  Ann Held, DO while in the presence of McGregor, DO. , personally preformed the services described in this documentation.  All medical record entries made by the scribe were at my direction and in my presence.  I have reviewed the chart and discharge instructions (if applicable) and agree that the record reflects my personal performance and is accurate and complete. 05/13/2021

## 2021-05-13 NOTE — Telephone Encounter (Signed)
  Follow up Call-  Call back number 05/11/2021  Post procedure Call Back phone  # [phone]  Permission to leave phone message Yes  Some recent data might be hidden     Patient questions:  Do you have a fever, pain , or abdominal swelling? No. Pain Score  0 *  Have you tolerated food without any problems? Yes.    Have you been able to return to your normal activities? Yes.    Do you have any questions about your discharge instructions: Diet   No. Medications  No. Follow up visit  No.  Do you have questions or concerns about your Care? No.  Actions: * If pain score is 4 or above: No action needed, pain <4.  Have you developed a fever since your procedure? no  2.   Have you had an respiratory symptoms (SOB or cough) since your procedure? no  3.   Have you tested positive for COVID 19 since your procedure no  4.   Have you had any family members/close contacts diagnosed with the COVID 19 since your procedure?  no   If yes to any of these questions please route to Joylene John, RN.

## 2021-05-13 NOTE — Patient Instructions (Signed)
Urinary Tract Infection, Adult A urinary tract infection (UTI) is an infection of any part of the urinary tract. The urinary tract includes the kidneys, ureters, bladder, and urethra. These organs make, store, and get rid of urine in the body. An upper UTI affects the ureters and kidneys. A lower UTI affects the bladder and urethra. What are the causes? Most urinary tract infections are caused by bacteria in your genital area around your urethra, where urine leaves your body. These bacteria grow and cause inflammation of your urinary tract. What increases the risk? You are more likely to develop this condition if: You have a urinary catheter that stays in place. You are not able to control when you urinate or have a bowel movement (incontinence). You are female and you: Use a spermicide or diaphragm for birth control. Have low estrogen levels. Are pregnant. You have certain genes that increase your risk. You are sexually active. You take antibiotic medicines. You have a condition that causes your flow of urine to slow down, such as: An enlarged prostate, if you are female. Blockage in your urethra. A kidney stone. A nerve condition that affects your bladder control (neurogenic bladder). Not getting enough to drink, or not urinating often. You have certain medical conditions, such as: Diabetes. A weak disease-fighting system (immunesystem). Sickle cell disease. Gout. Spinal cord injury. What are the signs or symptoms? Symptoms of this condition include: Needing to urinate right away (urgency). Frequent urination. This may include small amounts of urine each time you urinate. Pain or burning with urination. Blood in the urine. Urine that smells bad or unusual. Trouble urinating. Cloudy urine. Vaginal discharge, if you are female. Pain in the abdomen or the lower back. You may also have: Vomiting or a decreased appetite. Confusion. Irritability or tiredness. A fever or  chills. Diarrhea. The first symptom in older adults may be confusion. In some cases, they may not have any symptoms until the infection has worsened. How is this diagnosed? This condition is diagnosed based on your medical history and a physical exam. You may also have other tests, including: Urine tests. Blood tests. Tests for STIs (sexually transmitted infections). If you have had more than one UTI, a cystoscopy or imaging studies may be done to determine the cause of the infections. How is this treated? Treatment for this condition includes: Antibiotic medicine. Over-the-counter medicines to treat discomfort. Drinking enough water to stay hydrated. If you have frequent infections or have other conditions such as a kidney stone, you may need to see a health care provider who specializes in the urinary tract (urologist). In rare cases, urinary tract infections can cause sepsis. Sepsis is a life-threatening condition that occurs when the body responds to an infection. Sepsis is treated in the hospital with IV antibiotics, fluids, and other medicines. Follow these instructions at home: Medicines Take over-the-counter and prescription medicines only as told by your health care provider. If you were prescribed an antibiotic medicine, take it as told by your health care provider. Do not stop using the antibiotic even if you start to feel better. General instructions Make sure you: Empty your bladder often and completely. Do not hold urine for long periods of time. Empty your bladder after sex. Wipe from front to back after urinating or having a bowel movement if you are female. Use each tissue only one time when you wipe. Drink enough fluid to keep your urine pale yellow. Keep all follow-up visits. This is important. Contact a health care provider   if: Your symptoms do not get better after 1-2 days. Your symptoms go away and then return. Get help right away if: You have severe pain in your  back or your lower abdomen. You have a fever or chills. You have nausea or vomiting. Summary A urinary tract infection (UTI) is an infection of any part of the urinary tract, which includes the kidneys, ureters, bladder, and urethra. Most urinary tract infections are caused by bacteria in your genital area. Treatment for this condition often includes antibiotic medicines. If you were prescribed an antibiotic medicine, take it as told by your health care provider. Do not stop using the antibiotic even if you start to feel better. Keep all follow-up visits. This is important. This information is not intended to replace advice given to you by your health care provider. Make sure you discuss any questions you have with your health care provider. Document Revised: 01/23/2020 Document Reviewed: 01/23/2020 Elsevier Patient Education  2022 [employer]  

## 2021-05-13 NOTE — Assessment & Plan Note (Signed)
Refer to neuro---  R/o osa

## 2021-05-15 LAB — URINE CULTURE
MICRO NUMBER:: 12657359
SPECIMEN QUALITY:: ADEQUATE

## 2021-05-23 ENCOUNTER — Other Ambulatory Visit: Payer: Self-pay

## 2021-05-23 ENCOUNTER — Ambulatory Visit (INDEPENDENT_AMBULATORY_CARE_PROVIDER_SITE_OTHER): Payer: Medicare Other | Admitting: Family Medicine

## 2021-05-23 ENCOUNTER — Encounter: Payer: Self-pay | Admitting: Family Medicine

## 2021-05-23 ENCOUNTER — Other Ambulatory Visit (HOSPITAL_COMMUNITY)
Admission: RE | Admit: 2021-05-23 | Discharge: 2021-05-23 | Disposition: A | Discharge status: Home / Self Care | Payer: Medicare Other | Source: Ambulatory Visit | Attending: Family Medicine | Admitting: Family Medicine

## 2021-05-23 VITALS — BP 122/70 | HR 78 | Temp 97.6°F | Resp 18 | Ht 66.0 in | Wt 148.6 lb

## 2021-05-23 DIAGNOSIS — N898 Other specified noninflammatory disorders of vagina: Secondary | ICD-10-CM

## 2021-05-23 DIAGNOSIS — R319 Hematuria, unspecified: Secondary | ICD-10-CM

## 2021-05-23 LAB — POC URINALSYSI DIPSTICK (AUTOMATED)
Bilirubin, UA: NEGATIVE
Blood, UA: NEGATIVE
Glucose, UA: NEGATIVE
Ketones, UA: NEGATIVE
Leukocytes, UA: NEGATIVE
Nitrite, UA: NEGATIVE
Protein, UA: NEGATIVE
Spec Grav, UA: 1.015 (ref 1.010–1.025)
Urobilinogen, UA: 0.2 E.U./dL
pH, UA: 6 (ref 5.0–8.0)

## 2021-05-23 MED ORDER — FLUCONAZOLE 150 MG PO TABS: ORAL_TABLET | ORAL | 0 refills | Status: DC

## 2021-05-23 NOTE — Assessment & Plan Note (Signed)
Repeat urine normal

## 2021-05-23 NOTE — Progress Notes (Signed)
Established Patient Office Visit  Subjective:  Patient ID: Alyssa Ford, female    DOB: January 29, 1951  Age: 70 y.o. MRN: [id number]  CC:  Chief Complaint  Patient presents with   Urinary Tract Infection    Pt states uti is better but thinks she has a yeast infection. Pt states having itching and soreness and states no discharge.    Follow-up    HPI Alyssa Ford presents for f/u urine.  She now has a yeast infection from the antibiotic.  No other complaints   Past Medical History:  Diagnosis Date   Anemia    Anxiety    Arthritis    Cataract    Chronic headaches    Depression    Diabetes mellitus    Diverticulosis of colon (without mention of hemorrhage) 2003, 2008   Colonoscopy   GERD (gastroesophageal reflux disease)    Glaucoma    Hiatal hernia 2003   EGD    Hyperlipidemia    Hypertension    Thyroid nodule     Past Surgical History:  Procedure Laterality Date   CARPAL TUNNEL RELEASE  2006   CARPAL TUNNEL RELEASE  12/24/2007   CATARACT EXTRACTION, BILATERAL     [place]---  sept and oct 2018   CERVICAL FUSION  3/08   CHOLECYSTECTOMY     FASCIECTOMY Left 08/01/2018   Procedure: FASCIECTOMY LEFT RING;  Surgeon: Daryll Brod, MD;  Location: [place];  Service: Orthopedics;  Laterality: Left;  UPPER ARM   TRIGGER FINGER RELEASE Left 08/01/2018   Procedure: RELEASE TRIGGER FINGER/A-1 PULLEY [place];  Surgeon: Daryll Brod, MD;  Location: [place];  Service: Orthopedics;  Laterality: Left;    Family History  Problem Relation Age of Onset   Dementia Mother    Breast cancer Mother 94   Kidney failure Mother 64       died from kidney failure from abx given to her for pneumonia   Hyperlipidemia Mother    Hypertension Mother    Diabetes Mother    Cancer Mother 85       breast   Parkinsonism Father    Diabetes Father 60   Hypertension Sister    Diabetes Brother    Hypertension Brother    Heart attack Brother    Heart disease Cousin  25       mi   Colon cancer Neg Hx    Esophageal cancer Neg Hx    Stomach cancer Neg Hx     Social History   Socioeconomic History   Marital status: Married    Spouse name: Not on file   Number of children: Not on file   Years of education: Not on file   Highest education level: Not on file  Occupational History   Occupation: retired  Tobacco Use   Smoking status: Former    Packs/day: 1.40    Years: 49.00    Pack years: 68.60    Types: Cigarettes    Quit date: 07/16/2015    Years since quitting: 5.8   Smokeless tobacco: Never   Tobacco comments:    Continues to vape but states pod do not contain nicotine  Vaping Use   Vaping Use: Every day  Substance and Sexual Activity   Alcohol use: Yes    Alcohol/week: 0.0 standard drinks    Comment: once weekly; wine and beer   Drug use: No   Sexual activity: Not Currently    Partners: Male  Other Topics  Concern   Not on file  Social History Narrative   Exercise--  walk   Social Determinants of Health   Financial Resource Strain: Low Risk    Difficulty of Paying Living Expenses: Not hard at all  Food Insecurity: No Food Insecurity   Worried About Charity fundraiser in the Last Year: Never true   Arboriculturist in the Last Year: Never true  Transportation Needs: No Transportation Needs   Lack of Transportation (Medical): No   Lack of Transportation (Non-Medical): No  Physical Activity: Insufficiently Active   Days of Exercise per Week: 4 days   Minutes of Exercise per Session: 20 min  Stress: No Stress Concern Present   Feeling of Stress : Only a little  Social Connections: Moderately Isolated   Frequency of Communication with Friends and Family: More than three times a week   Frequency of Social Gatherings with Friends and Family: More than three times a week   Attends Religious Services: Never   Marine scientist or Organizations: No   Attends Music therapist: Never   Marital Status: Married   Human resources officer Violence: Not At Risk   Fear of Current or Ex-Partner: No   Emotionally Abused: No   Physically Abused: No   Sexually Abused: No    Outpatient Medications Prior to Visit  Medication Sig Dispense Refill   acarbose (PRECOSE) 50 MG tablet TAKE 1 TABLET(50 MG) BY MOUTH THREE TIMES DAILY WITH MEALS 270 tablet 3   aspirin EC 81 MG tablet Take 81 mg by mouth daily.     azelastine (ASTELIN) 0.1 % nasal spray Place 1 spray into both nostrils 2 (two) times daily. Per ENT     BIOTIN PO Take 1 tablet by mouth daily.      bromocriptine (PARLODEL) 2.5 MG tablet TAKE 1 TABLET(2.5 MG) BY MOUTH DAILY 90 tablet 3   ciprofloxacin (CIPRO) 500 MG tablet Take 1 tablet (500 mg total) by mouth 2 (two) times daily for 10 days. 20 tablet 0   Coenzyme Q10 (CO Q 10 PO) Take 1 capsule by mouth daily.     fenofibrate 160 MG tablet TAKE 1 TABLET(160 MG) BY MOUTH DAILY 90 tablet 1   Ferrous Sulfate (IRON SUPPLEMENT PO) Take 1 tablet by mouth daily. 65mg      latanoprost (XALATAN) 0.005 % ophthalmic solution Place 1 drop into both eyes at bedtime.      levothyroxine (SYNTHROID) 25 MCG tablet Take 1 tablet (25 mcg total) by mouth daily. 90 tablet 3   lisinopril (ZESTRIL) 5 MG tablet Take 1 tablet (5 mg total) by mouth daily. 90 tablet 1   metFORMIN (GLUCOPHAGE-XR) 500 MG 24 hr tablet TAKE 4 TABLETS(2000 MG) BY MOUTH DAILY WITH BREAKFAST 360 tablet 0   Naproxen Sodium (ALEVE PO) Take 2 tablets by mouth daily.      omeprazole (PRILOSEC) 40 MG capsule TAKE 1 CAPSULE(40 MG) BY MOUTH DAILY 90 capsule 1   repaglinide (PRANDIN) 2 MG tablet Take 1 tablet (2 mg total) by mouth 2 (two) times daily before a meal. 180 tablet 3   rosuvastatin (CRESTOR) 40 MG tablet TAKE 1 TABLET(40 MG) BY MOUTH AT BEDTIME 90 tablet 1   Semaglutide (RYBELSUS) 7 MG TABS Take 7 mg by mouth daily. 90 tablet 3   traZODone (DESYREL) 50 MG tablet Take 0.5-1 tablets (25-50 mg total) by mouth at bedtime as needed for sleep. 30 tablet 3   No  facility-administered medications prior to  visit.    Allergies  Allergen Reactions   Codeine Nausea Only    Severe nausea    ROS Review of Systems  Constitutional:  Negative for appetite change, diaphoresis, fatigue and unexpected weight change.  Eyes:  Negative for pain, redness and visual disturbance.  Respiratory:  Negative for cough, chest tightness, shortness of breath and wheezing.   Cardiovascular:  Negative for chest pain, palpitations and leg swelling.  Endocrine: Negative for cold intolerance, heat intolerance, polydipsia, polyphagia and polyuria.  Genitourinary:  Negative for difficulty urinating, dysuria, flank pain, frequency, vaginal bleeding, vaginal discharge and vaginal pain.       Vaginal itching   Neurological:  Negative for dizziness, light-headedness, numbness and headaches.     Objective:    Physical Exam Vitals and nursing note reviewed.  Abdominal:     General: There is no distension.     Tenderness: There is no abdominal tenderness.  Neurological:     General: No focal deficit present.  Psychiatric:        Mood and Affect: Mood normal.        Behavior: Behavior normal.        Thought Content: Thought content normal.        Judgment: Judgment normal.    BP 122/70 (BP Location: Left Arm, Patient Position: Sitting, Cuff Size: Normal)   Pulse 78   Temp 97.6 F (36.4 C) (Oral)   Resp 18   Ht 5\' 6"  (1.676 m)   Wt 148 lb 9.6 oz (67.4 kg)   SpO2 97%   BMI 23.98 kg/m  Wt Readings from Last 3 Encounters:  05/23/21 148 lb 9.6 oz (67.4 kg)  05/13/21 151 lb 3.2 oz (68.6 kg)  05/11/21 152 lb (68.9 kg)     Health Maintenance Due  Topic Date Due   OPHTHALMOLOGY EXAM  04/08/2020    There are no preventive care reminders to display for this patient.  Lab Results  Component Value Date   TSH 3.66 02/03/2021   Lab Results  Component Value Date   WBC 12.2 (H) 04/30/2021   HGB 12.5 04/30/2021   HCT 38.4 04/30/2021   MCV 89.1 04/30/2021   PLT  247 04/30/2021   Lab Results  Component Value Date   NA 141 04/30/2021   K 4.0 04/30/2021   CO2 27 04/30/2021   GLUCOSE 112 (H) 04/30/2021   BUN 12 04/30/2021   CREATININE 0.88 04/30/2021   BILITOT 0.4 02/03/2021   ALKPHOS 56 02/03/2021   AST 27 02/03/2021   ALT 33 02/03/2021   PROT 7.3 02/03/2021   ALBUMIN 4.5 02/03/2021   CALCIUM 9.7 04/30/2021   ANIONGAP 10 04/30/2021   GFR 73.31 02/03/2021   Lab Results  Component Value Date   CHOL 113 02/03/2021   Lab Results  Component Value Date   HDL 39.50 02/03/2021   Lab Results  Component Value Date   LDLCALC 46 02/03/2021   Lab Results  Component Value Date   TRIG 139.0 02/03/2021   Lab Results  Component Value Date   CHOLHDL 3 02/03/2021   Lab Results  Component Value Date   HGBA1C 5.9 (A) 03/22/2021      Assessment & Plan:   Problem List Items Addressed This Visit   None Visit Diagnoses     Vaginal itching    -  Primary   Relevant Medications   fluconazole (DIFLUCAN) 150 MG tablet   Other Relevant Orders   Cervicovaginal ancillary only( [place])   Hematuria,  unspecified type       Relevant Orders   POCT Urinalysis Dipstick (Automated) (Completed)       Meds ordered this encounter  Medications   fluconazole (DIFLUCAN) 150 MG tablet    Sig: 1 po x1, may repeat in 3 days prn    Dispense:  2 tablet    Refill:  0    Follow-up: Return if symptoms worsen or fail to improve.    Ann Held, DO

## 2021-05-23 NOTE — Patient Instructions (Signed)

## 2021-05-23 NOTE — Assessment & Plan Note (Signed)
Diflucan sent in Can use monistat otc for external itching

## 2021-05-25 ENCOUNTER — Ambulatory Visit (INDEPENDENT_AMBULATORY_CARE_PROVIDER_SITE_OTHER): Payer: Medicare Other | Admitting: Pharmacist

## 2021-05-25 DIAGNOSIS — E1165 Type 2 diabetes mellitus with hyperglycemia: Secondary | ICD-10-CM | POA: Diagnosis not present

## 2021-05-25 DIAGNOSIS — E785 Hyperlipidemia, unspecified: Secondary | ICD-10-CM

## 2021-05-25 LAB — CERVICOVAGINAL ANCILLARY ONLY
Bacterial Vaginitis (gardnerella): NEGATIVE
Candida Glabrata: NEGATIVE
Candida Vaginitis: POSITIVE — AB
Chlamydia: NEGATIVE
Comment: NEGATIVE
Comment: NEGATIVE
Comment: NEGATIVE
Comment: NEGATIVE
Comment: NEGATIVE
Comment: NORMAL
Neisseria Gonorrhea: NEGATIVE
Trichomonas: NEGATIVE

## 2021-05-25 NOTE — Patient Instructions (Signed)
Visit Information  PATIENT GOALS:  Goals Addressed             This Visit's Progress    Chronic Care Management Pharmacy Care Plan   On track    CARE PLAN ENTRY (see longitudinal plan of care for additional care plan information)  Current Barriers:  Chronic Disease Management support, education, and care coordination needs related to Diabetes, Hypertension, Hyperlipidemia, Nerve Pain, Anxiety, Glaucoma, GERD, Insomnia   Hypertension BP Readings from Last 3 Encounters:  05/23/21 122/70  05/13/21 120/70  05/11/21 (!) 157/72  Pharmacist Clinical Goal(s): Over the next 90 days, patient will work with PharmD and providers to maintain BP goal <140/90 Current regimen:  Lisinopril 5mg  daily in morning Patient self care activities - Over the next 90 days, patient will: Check blood pressure weekly, document, and provide at future appointments Ensure daily salt intake < 2300 mg/day  Hyperlipidemia Lab Results  Component Value Date   CHOL 113 02/03/2021   CHOL 110 09/06/2020   CHOL 163 06/07/2020   Lab Results  Component Value Date   HDL 39.50 02/03/2021   HDL 38.20 (L) 09/06/2020   HDL 41.60 06/07/2020   Lab Results  Component Value Date   LDLCALC 46 02/03/2021   [place] 46 09/06/2020   [place] 59 09/22/2019   Lab Results  Component Value Date   TRIG 139.0 02/03/2021   TRIG 128.0 09/06/2020   TRIG 259.0 (H) 06/07/2020  Pharmacist Clinical Goal(s): Over the next 90 days, patient will work with PharmD and providers to maintain LDL goal < 100 and Triglycerides <150 Current regimen:  Rosuvastatin 40mg  daily (for LDL / 'bad' cholesterol) Fenofibrate 160mg  dialy (for triglycerides) [place]ne Q10 200mg  daily Interventions: Reviewed goals for triglycerides and LDL. Maintain current cholesterol medication regimen.   Diabetes Lab Results  Component Value Date   HGBA1C 5.9 (A) 03/22/2021  Pharmacist Clinical Goal(s): Over the next 90 days, patient will work with PharmD  and providers to maintain A1c goal <7% Current regimen:  Acarbose 50mg  three times a day with first bite of each meal Metformin XR 500 - take four tablets with morning meal Repaglinide 2mg  - take 2 tablets 30 minutes before each meal Bromocriptine 2.5mg  daily Rybelsus 7mg  - take daily 30 minutes before morning meal Patient self care activities - Over the next 90 days, patient will: Check blood sugar once daily, document, and provide at future appointments Contact provider with any episodes of hypoglycemia Complete application for Rybelsus patient assistance program for 2023. Increase exercise frequency to achieve goal of 150 minutes per week of moderate intensity exercise (like walking)  GERD Pharmacist Clinical Goal(s) Over the next 90 days, patient will work with PharmD and providers to reduce symptoms of GERD Current regimen:  Omeprazole 40mg  daily Patient self care activities - Over the next 90 days, patient will: Maintain current medication regimen acid relfux  Thyroid Health:   Lab Results  Component Value Date   TSH 3.66 02/03/2021  Pharmacist Clinical Goal(s) Over the next 90 days, patient will work with PharmD and providers to supplement thyroid hormone and improve sleep Current regimen:  Levotyroxine 25 mcg daily in morning - started 11/17/2020 Interventions: Patient advised to hold Biotin for 1 week prior to thyroid testing  Patient self care activities - Over the next 90 days, patient will: Do not take Biotin 1 week prior to thyroid testing Follow up with Dr Loanne Drilling as planned.   Medication management Pharmacist Clinical Goal(s): Over the next 90 days, patient will  work with PharmD and providers to maintain optimal medication adherence Current pharmacy: [facility] Interventions Comprehensive medication review performed. Continue current medication management strategy Patient self care activities - Over the next 90 days, patient will: Focus on medication  adherence by filling medications appropriately  Take medications as prescribed Report any questions or concerns to PharmD and/or provider(s)  Please see past updates related to this goal by clicking on the "Past Updates" button in the selected goal          Patient verbalizes understanding of instructions provided today and agrees to view in [place].   Telephone follow up appointment with care management team member scheduled for: 20 days to follow up on PAP  Cherre Robins, PharmD Clinical Pharmacist [facility]

## 2021-05-25 NOTE — Chronic Care Management (AMB) (Signed)
Chronic Care Management Pharmacy Note  05/25/2021 Name:  Alyssa Ford MRN:  [id number] DOB:  [birth year]  Subjective: Alyssa Ford is an 70 y.o. year old female who is a primary patient of Ann Held, DO.  The CCM team was consulted for assistance with disease management and care coordination needs.    Engaged with patient by telephone for follow up visit in response to provider referral for pharmacy case management and/or care coordination services.   Consent to Services:  The patient was given information about Chronic Care Management services, agreed to services, and gave verbal consent prior to initiation of services.  Please see initial visit note for detailed documentation.   Patient Care Team: Carollee Herter, Alferd Apa, DO as PCP - General Calvert Cantor, MD as Consulting Physician (Ophthalmology) Levy Sjogren, MD as Referring Physician (Dermatology) Ilda Foil Sol Blazing, MD as Referring Physician (Pediatrics) Renato Shin, MD as Consulting Physician (Endocrinology) Cherre Robins, RPH-CPP (Pharmacist)  Recent office visits: 05/23/2021 - PCP (Dr Etter Sjogren)  seen for vaginal itching. Prescribed fluconazole 150mg  for 1 dose, may repeat in 3 days if needed. Urine dip negative. 05/13/2021 - PCP (Dr Etter Sjogren)  seen for insomnia and hospital follow up. Increased trazodone to 50mg  daily.  Referred to neurology for sleep study. Also prescribed ciprofloxacin 500mg  every 12 hours for UTI.  02/03/2021 - PCP (Dr Etter Sjogren) F/U chronic conditions. No medication changes. BMP, TSH and lipids checked. ER Follow-up - chest pain. Referred to cardio for workup of CP; Patient also mentioned BG elevated. Recommended take acarbose 100mg  tid for BG over 300. F/U with endo in 3 days (Monday).  Recent consult visits: 11/17/2020 - Endo (Dr Loanne Drilling) added levothyroxine 35mcg daily; No change in meds for DM; A1c was 6.5% - much improved! 08/24/2020 - Endo (Dr Loanne Drilling) - f/u diabetes;  Increased Rybelsus form 3mg  daily to 7mg  daily. 08/19/2020 - Cardio (Dr Johnsie Cancel) Initial evaluation of CP. No med changes  Hospital visits: 04/30/2021 - ED Visit for hematuria. Prescribed cephalexin 500mg  4 times a day  Objective:  Lab Results  Component Value Date   CREATININE 0.88 04/30/2021   CREATININE 0.81 02/03/2021   CREATININE 0.94 09/06/2020    Lab Results  Component Value Date   HGBA1C 5.9 (A) 03/22/2021   Last diabetic Eye exam:  Lab Results  Component Value Date/Time   HMDIABEYEEXA Retinopathy (A) 06/17/2018 12:00 AM    Last diabetic Foot exam:  Lab Results  Component Value Date/Time   HMDIABFOOTEX normal 06/06/2010 12:00 AM        Component Value Date/Time   CHOL 113 02/03/2021 0846   TRIG 139.0 02/03/2021 0846   TRIG 216 (HH) 04/24/2006 0741   HDL 39.50 02/03/2021 0846   CHOLHDL 3 02/03/2021 0846   VLDL 27.8 02/03/2021 0846   LDLCALC 46 02/03/2021 0846   LDLDIRECT 87.0 06/07/2020 0838    Hepatic Function Latest Ref Rng & Units 02/03/2021 09/06/2020 08/04/2020  Total Protein 6.0 - 8.3 g/dL 7.3 7.1 7.5  Albumin 3.5 - 5.2 g/dL 4.5 4.5 4.2  AST 0 - 37 U/L 27 20 28   ALT 0 - 35 U/L 33 27 31  Alk Phosphatase 39 - 117 U/L 56 49 60  Total Bilirubin 0.2 - 1.2 mg/dL 0.4 0.4 0.2(L)  Bilirubin, Direct 0.0 - 0.3 mg/dL - - -    Lab Results  Component Value Date/Time   TSH 3.66 02/03/2021 08:46 AM   TSH 6.51 (H) 08/24/2020 07:56 AM   FREET4  1.31 08/24/2020 07:56 AM   FREET4 1.28 04/22/2020 08:12 AM    CBC Latest Ref Rng & Units 04/30/2021 08/04/2020 01/06/2020  WBC 4.0 - 10.5 K/uL 12.2(H) 6.7 9.0  Hemoglobin 12.0 - 15.0 g/dL 12.5 12.5 12.9  Hematocrit 36.0 - 46.0 % 38.4 38.2 38.8  Platelets 150 - 400 K/uL 247 257 260.0    Lab Results  Component Value Date/Time   VD25OH 43 02/23/2010 09:05 PM   VD25OH 25 (L) 07/14/2009 09:49 PM    Clinical ASCVD: Yes  The ASCVD Risk score (Arnett DK, et al., 2019) failed to calculate for the following reasons:   The  valid total cholesterol range is 130 to 320 mg/dL     Social History   Tobacco Use  Smoking Status Former   Packs/day: 1.40   Years: 49.00   Pack years: 68.60   Types: Cigarettes   Quit date: 07/16/2015   Years since quitting: 5.8  Smokeless Tobacco Never  Tobacco Comments   Continues to vape but states pod do not contain nicotine   BP Readings from Last 3 Encounters:  05/23/21 122/70  05/13/21 120/70  05/11/21 (!) 157/72   Pulse Readings from Last 3 Encounters:  05/23/21 78  05/13/21 81  05/11/21 67   Wt Readings from Last 3 Encounters:  05/23/21 148 lb 9.6 oz (67.4 kg)  05/13/21 151 lb 3.2 oz (68.6 kg)  05/11/21 152 lb (68.9 kg)    Assessment: Review of patient past medical history, allergies, medications, health status, including review of consultants reports, laboratory and other test data, was performed as part of comprehensive evaluation and provision of chronic care management services.   SDOH:  (Social Determinants of Health) assessments and interventions performed:  SDOH Interventions    Flowsheet Row Most Recent Value  SDOH Interventions   Financial Strain Interventions Other (Comment)  [Getting Rybelsus through patient assistance - current approved thru 06/25/2021]       CCM Care Plan  Allergies  Allergen Reactions   Codeine Nausea Only    Severe nausea    Medications Reviewed Today     Reviewed by Cherre Robins, RPH-CPP (Pharmacist) on 05/25/21 at 1420  Med List Status: <None>   Medication Order Taking? Sig Documenting Provider Last Dose Status Informant  acarbose (PRECOSE) 50 MG tablet [id number] Yes TAKE 1 TABLET(50 MG) BY MOUTH THREE TIMES DAILY WITH MEALS Renato Shin, MD Taking Active   aspirin EC 81 MG tablet [id number] Yes Take 81 mg by mouth daily. Ann Held, DO Taking Active Self  azelastine (ASTELIN) 0.1 % nasal spray [id number] Yes Place 1 spray into both nostrils 2 (two) times daily. Per ENT [provider] Taking  Active   BIOTIN PO [id number] Yes Take 1 tablet by mouth daily.  [provider] Taking Active Self  bromocriptine (PARLODEL) 2.5 MG tablet [id number] Yes TAKE 1 TABLET(2.5 MG) BY MOUTH DAILY Renato Shin, MD Taking Active   Coenzyme Q10 (CO Q 10 PO) [id number] Yes Take 1 capsule by mouth daily. [provider] Taking Active   fenofibrate 160 MG tablet [id number] Yes TAKE 1 TABLET(160 MG) BY MOUTH DAILY Roma Schanz R, DO Taking Active   Ferrous Sulfate (IRON SUPPLEMENT PO) [id number] Yes Take 1 tablet by mouth daily. $RemoveBefo'65mg'WaMJxwyEXQU$  [provider] Taking Active   fluconazole (DIFLUCAN) 150 MG tablet [id number] Yes 1 po x1, may repeat in 3 days prn Carollee Herter, Alferd Apa, DO Taking Active   latanoprost (XALATAN) 0.005 % ophthalmic solution [id number] Yes  Place 1 drop into both eyes at bedtime.  [provider] Taking Active            Med Note Marikay Alar Aug 19, 2020  3:42 PM)    levothyroxine (SYNTHROID) 25 MCG tablet [id number] Yes Take 1 tablet (25 mcg total) by mouth daily. Renato Shin, MD Taking Active   lisinopril (ZESTRIL) 5 MG tablet [id number] Yes Take 1 tablet (5 mg total) by mouth daily. Carollee Herter, Kendrick Fries R, DO Taking Active   metFORMIN (GLUCOPHAGE-XR) 500 MG 24 hr tablet [id number] Yes TAKE 4 TABLETS(2000 MG) BY MOUTH DAILY WITH Paticia Stack, MD Taking Active   Naproxen Sodium (ALEVE PO) [id number] Yes Take 2 tablets by mouth daily.  [provider] Taking Active Self           Med Note Dorothyann Peng, MARTHA A   Tue Jan 18, 2021  9:00 AM) Taking as needed  omeprazole (PRILOSEC) 40 MG capsule [id number] Yes TAKE 1 CAPSULE(40 MG) BY MOUTH DAILY Carollee Herter, Alferd Apa, DO Taking Active   repaglinide (PRANDIN) 2 MG tablet [id number] Yes Take 1 tablet (2 mg total) by mouth 2 (two) times daily before a meal. Renato Shin, MD Taking Active   rosuvastatin (CRESTOR) 40 MG tablet [id number] Yes TAKE 1 TABLET(40 MG) BY MOUTH AT BEDTIME Carollee Herter,  Kendrick Fries R, DO Taking Active   Semaglutide (RYBELSUS) 7 MG TABS [id number] Yes Take 7 mg by mouth daily. Renato Shin, MD Taking Active            Med Note [facility], Jenene Slicker Jan 10, 2021 11:52 AM) Receiving from NovoNordisk PAP thru 05/2021  traZODone (DESYREL) 50 MG tablet [id number] Yes Take 0.5-1 tablets (25-50 mg total) by mouth at bedtime as needed for sleep. Ann Held, DO Taking Active             Patient Active Problem List   Diagnosis Date Noted   Vaginal itching 05/23/2021   Hematuria 05/23/2021   Insomnia 05/13/2021   Hypothyroidism 08/24/2020   Chest pain 08/06/2020   Trichimoniasis 04/27/2020   Cervical cancer screening 04/27/2020   Left thyroid nodule 04/13/2020   Vaginal odor 01/11/2020   Icterus 01/11/2020   Uncontrolled type 2 diabetes mellitus with hyperglycemia ([place]) 09/22/2019   De Quervain's disease (tenosynovitis) 09/22/2019   Neck mass 07/11/2018   Abnormal WBC count 05/29/2017   Skin fissure 09/24/2015   Allergic rhinitis 03/26/2014   Wheezing 03/26/2014   DM (diabetes mellitus) type II uncontrolled, periph vascular disorder 08/25/2011   Headache(784.0) 08/21/2011   Acute sinusitis 04/26/2010   URI 04/26/2010   UNSPECIFIED CATARACT 02/23/2010   POSTMENOPAUSAL STATUS 12/12/2007   FATIGUE 09/11/2007   THYROID NODULE 11/22/2006   Diabetes ([place]) 11/22/2006   Hyperlipidemia LDL goal <70 11/22/2006   GLUCOMA 11/22/2006   Essential hypertension 11/22/2006   GERD 11/22/2006    Immunization History  Administered Date(s) Administered   Fluad Quad(high Dose 65+) 04/08/2019, 04/08/2020   Influenza Split 04/13/2011   Influenza Whole 04/17/2006   Influenza, High Dose Seasonal PF 04/12/2016, 05/07/2017, 04/08/2018   Influenza, Seasonal, Injecte, Preservative Fre 05/22/2012   Influenza,inj,Quad PF,6+ Mos 03/03/2014, 03/18/2015   Influenza-Unspecified 05/07/2017, 05/02/2021   PFIZER(Purple Top)SARS-COV-2 Vaccination 07/21/2019, 08/11/2019,  04/27/2020, 11/23/2020   Pfizer Covid-19 Vaccine Bivalent Booster 76yrs & up 05/03/2021   Pneumococcal Conjugate-13 09/13/2015, 05/07/2017, 04/15/2019   Pneumococcal Polysaccharide-23 03/31/2003, 11/03/2008, 05/07/2017   Td 10/24/1999, 02/23/2010   Tdap 02/21/2019   Zoster Recombinat (  Shingrix) 03/09/2017, 06/23/2017   Zoster, Live 12/25/2011    Conditions to be addressed/monitored: CAD, HTN and DMII  Care Plan : General Pharmacy (Adult)  Updates made by Cherre Robins, RPH-CPP since 05/25/2021 12:00 AM     Problem: Medication management and chronic disease management, support, education and care coordination for diabetes, HTN, HDL, nerve pain, anxiety, glaucoma, insomnia and GERD   Priority: High  Onset Date: 11/10/2020  Note:   Current Barriers:  Unable to maintain control of type 2 DM (A1c improved and at goal as of 11/17/20) Difficulty sleeping not improved with current therapy Chronic Disease Management support, education, and care coordination needed in related to Diabetes, Hypertension, Hyperlipidemia, Nerve Pain, Anxiety, Glaucoma, GERD, Insomnia  Pharmacist Clinical Goal(s):  Over the next 90 days, patient will verbalize ability to afford treatment regimen achieve control of diabetes as evidenced by A1c < 7.0% maintain control of hyperlipidemia and HTN as evidenced by maintaining goals listed below  Improve sleep duration and quality  through collaboration with PharmD and provider.   Interventions: 1:1 collaboration with Carollee Herter, Alferd Apa, DO regarding development and update of comprehensive plan of care as evidenced by provider attestation and co-signature Inter-disciplinary care team collaboration (see longitudinal plan of care) Comprehensive medication review performed; medication list updated in electronic medical record  Hypertension Controlled; BP goal <140/90 Current regimen:  Lisinopril 5mg  daily in morning Interventions: (addressed at previous visit) Check  blood pressure weekly, document, and provide at future appointments Ensure daily salt intake < 2300 mg/day  Hyperlipidemia Controlled; LDL goal < 100 and Triglycerides <150 Current regimen:  Rosuvastatin 40mg  daily (for LDL / 'bad' cholesterol) Fenofibrate 160mg  dialy (for triglycerides) Coenzyne Q10 200mg  daily Interventions: Reviewed goals for triglycerides and LDL. Maintain current cholesterol medication regimen.   Diabetes As of 09/278/2022 A1c was 5.9%;  A1c goal <7%  Recent home BG readings have been improving Checking every other day Ranging from 105 to 140 Denies hypoglycemia symptoms She does report that she has noticed decreased appetite since starting Rybelsus. Sometimes she feel fullness but knows that Rybelsus is working well.  Patient has been enrolled since June 2022 with [employer] medication assistance. Received first shipment around 01/05/2021.  Current regimen:  Acarbose 50mg  three times a day with first bite of each meal Metformin XR 500 - take four tablets with morning meal Repaglinide 2mg  - take 2 tablets 30 minutes before each meal Bromocriptine 2.5mg  daily Rybelsus 7mg  - take daily 30 minutes before morning meal Interventions;  Reviewed appropriate timing of medication administration to get best blood glucose lowering effects Recommended to check blood sugar daily, document, and provide at future appointments Coordinated with NovoNordisk medication assistance program. Patient has order that is being processed but [facility] is experiencing shipping delays and is not sure when will ship. Patient states she has enough Rybelsus for awhile but was not at home to know for certain. If shipment does not arrive by the time she needs Rybelsus, can request voucher to use at pharmacy from [place].  Patient provided with application for [id number] or Rybelsus / [employer] If A1c continue to be <7.0 - might be able to consider lowering dose of acarbose to see if this helps with  fullness / decreased appetite  - though I did explain to patient that she is how Rybelsus works to improve blood glucose.  Continue to walk daily to achieve goal of 150 minutes per week of moderate intensity exercise (like walking)  GERD (goal -  reduce  symptoms of GERD) Controlled Denies breakthrough GERD symptoms except when she occasionally forgets to take omeprazole Current regimen:  Omeprazole $RemoveBef'40mg'pCpVuQozyl$  daily Interventions: (addressed at previous visit) Maintain current medication regimen for acid relfux  Thyroid Health:  Lab Results  Component Value Date   TSH 3.66 02/03/2021  Dr Loanne Drilling started thyroid supplementation 11/17/20 Current regimen:  Levothyroxine 71mcg daily - started 11/17/2020 Interventions: Patient advised to hold Biotin at least 1 week prior to any thyroid testing Follow up with Dr Loanne Drilling as planned.    Medication management Current pharmacy: [facility] Interventions Comprehensive medication review performed. Continue current medication management strategy Reviewed medication refill history to assess adherence  Patient Goals/Self-Care Activities Over the next 90 days, patient will:  take medications as prescribed, check glucose daily, document, and provide at future appointments, and collaborate with provider on medication access solutions  Follow Up Plan: Telephone follow up appointment with care management team member scheduled for:  1 month to check on patient assistance          Medication Assistance: Rybelsus has been approved thru [employer] - initial approval 11/2020 - first shipment 01/05/2021. Approved through 05/25/2021. Spoke with representative with Novo and refill is in process but they are experience shipping delays. Patient provided with application for [phone].   Patient's preferred pharmacy is:  [facility] [place], [address] AT [address] [place]  [address] Phone: [phone] Fax: [phone]   Follow Up:  Patient agrees to Care Plan and Follow-up.  Plan: The care management team will reach out to the patient again over the next 20 days.   Cherre Robins, PharmD Clinical Pharmacist [facility] [phone]

## 2021-05-26 ENCOUNTER — Encounter: Payer: Self-pay | Admitting: Family Medicine

## 2021-05-26 ENCOUNTER — Ambulatory Visit: Payer: Medicare Other | Admitting: Family Medicine

## 2021-05-27 ENCOUNTER — Ambulatory Visit: Payer: Medicare Other | Admitting: Family Medicine

## 2021-05-27 ENCOUNTER — Encounter: Payer: Self-pay | Admitting: Gastroenterology

## 2021-05-31 ENCOUNTER — Other Ambulatory Visit: Payer: Self-pay | Admitting: Family Medicine

## 2021-05-31 DIAGNOSIS — K219 Gastro-esophageal reflux disease without esophagitis: Secondary | ICD-10-CM

## 2021-06-02 ENCOUNTER — Encounter: Payer: Self-pay | Admitting: Family Medicine

## 2021-06-02 ENCOUNTER — Ambulatory Visit (INDEPENDENT_AMBULATORY_CARE_PROVIDER_SITE_OTHER): Payer: Medicare Other | Admitting: Family Medicine

## 2021-06-02 VITALS — BP 114/78 | HR 73 | Temp 97.4°F | Resp 18 | Ht 66.0 in | Wt 148.2 lb

## 2021-06-02 DIAGNOSIS — D1801 Hemangioma of skin and subcutaneous tissue: Secondary | ICD-10-CM | POA: Diagnosis not present

## 2021-06-02 NOTE — Assessment & Plan Note (Signed)
Benign If they get bigger or change color etc --- rto

## 2021-06-02 NOTE — Progress Notes (Addendum)
Subjective:   By signing my name below, I, Shehryar Baig, attest that this documentation has been prepared under the direction and in the presence of Dr. Roma Schanz, DO. 06/02/2021    Patient ID: Alyssa Ford, female    DOB: 1950-12-01, 70 y.o.   MRN: [id number]  Chief Complaint  Patient presents with   vaginal concern    Pt states no discharge or itching coming from the black spots.     HPI Patient is in today for a office visit.  She complains of black spots on her vagina. She notes that her husband thought they were age spots.    Past Medical History:  Diagnosis Date   Anemia    Anxiety    Arthritis    Cataract    Chronic headaches    Depression    Diabetes mellitus    Diverticulosis of colon (without mention of hemorrhage) 2003, 2008   Colonoscopy   GERD (gastroesophageal reflux disease)    Glaucoma    Hiatal hernia 2003   EGD    Hyperlipidemia    Hypertension    Thyroid nodule     Past Surgical History:  Procedure Laterality Date   CARPAL TUNNEL RELEASE  2006   CARPAL TUNNEL RELEASE  12/24/2007   CATARACT EXTRACTION, BILATERAL     [facility]---  sept and oct 2018   CERVICAL FUSION  3/08   CHOLECYSTECTOMY     FASCIECTOMY Left 08/01/2018   Procedure: FASCIECTOMY LEFT RING;  Surgeon: Daryll Brod, MD;  Location: [facility];  Service: Orthopedics;  Laterality: Left;  UPPER ARM   TRIGGER FINGER RELEASE Left 08/01/2018   Procedure: RELEASE TRIGGER FINGER/A-1 PULLEY [place];  Surgeon: Daryll Brod, MD;  Location: [facility];  Service: Orthopedics;  Laterality: Left;    Family History  Problem Relation Age of Onset   Dementia Mother    Breast cancer Mother 55   Kidney failure Mother 67       died from kidney failure from abx given to her for pneumonia   Hyperlipidemia Mother    Hypertension Mother    Diabetes Mother    Cancer Mother 36       breast   Parkinsonism Father    Diabetes Father 51   Hypertension Sister     Diabetes Brother    Hypertension Brother    Heart attack Brother    Heart disease Cousin 45       mi   Colon cancer Neg Hx    Esophageal cancer Neg Hx    Stomach cancer Neg Hx     Social History   Socioeconomic History   Marital status: Married    Spouse name: Not on file   Number of children: Not on file   Years of education: Not on file   Highest education level: Not on file  Occupational History   Occupation: retired  Tobacco Use   Smoking status: Former    Packs/day: 1.40    Years: 49.00    Pack years: 68.60    Types: Cigarettes    Quit date: 07/16/2015    Years since quitting: 5.8   Smokeless tobacco: Never   Tobacco comments:    Continues to vape but states pod do not contain nicotine  Vaping Use   Vaping Use: Every day  Substance and Sexual Activity   Alcohol use: Yes    Alcohol/week: 0.0 standard drinks    Comment: once weekly; wine and beer  Drug use: No   Sexual activity: Not Currently    Partners: Male  Other Topics Concern   Not on file  Social History Narrative   Exercise--  walk   Social Determinants of Health   Financial Resource Strain: Low Risk    Difficulty of Paying Living Expenses: Not very hard  Food Insecurity: No Food Insecurity   Worried About Running Out of Food in the Last Year: Never true   Ran Out of Food in the Last Year: Never true  Transportation Needs: No Transportation Needs   Lack of Transportation (Medical): No   Lack of Transportation (Non-Medical): No  Physical Activity: Insufficiently Active   Days of Exercise per Week: 4 days   Minutes of Exercise per Session: 20 min  Stress: No Stress Concern Present   Feeling of Stress : Only a little  Social Connections: Moderately Isolated   Frequency of Communication with Friends and Family: More than three times a week   Frequency of Social Gatherings with Friends and Family: More than three times a week   Attends Religious Services: Never   Marine scientist or  Organizations: No   Attends Music therapist: Never   Marital Status: Married  Human resources officer Violence: Not At Risk   Fear of Current or Ex-Partner: No   Emotionally Abused: No   Physically Abused: No   Sexually Abused: No    Outpatient Medications Prior to Visit  Medication Sig Dispense Refill   acarbose (PRECOSE) 50 MG tablet TAKE 1 TABLET(50 MG) BY MOUTH THREE TIMES DAILY WITH MEALS 270 tablet 3   aspirin EC 81 MG tablet Take 81 mg by mouth daily.     azelastine (ASTELIN) 0.1 % nasal spray Place 1 spray into both nostrils 2 (two) times daily. Per ENT     BIOTIN PO Take 1 tablet by mouth daily.      bromocriptine (PARLODEL) 2.5 MG tablet TAKE 1 TABLET(2.5 MG) BY MOUTH DAILY 90 tablet 3   Coenzyme Q10 (CO Q 10 PO) Take 1 capsule by mouth daily.     fenofibrate 160 MG tablet TAKE 1 TABLET(160 MG) BY MOUTH DAILY 90 tablet 1   Ferrous Sulfate (IRON SUPPLEMENT PO) Take 1 tablet by mouth daily. 65mg      fluconazole (DIFLUCAN) 150 MG tablet 1 po x1, may repeat in 3 days prn 2 tablet 0   latanoprost (XALATAN) 0.005 % ophthalmic solution Place 1 drop into both eyes at bedtime.      levothyroxine (SYNTHROID) 25 MCG tablet Take 1 tablet (25 mcg total) by mouth daily. 90 tablet 3   lisinopril (ZESTRIL) 5 MG tablet Take 1 tablet (5 mg total) by mouth daily. 90 tablet 1   metFORMIN (GLUCOPHAGE-XR) 500 MG 24 hr tablet TAKE 4 TABLETS(2000 MG) BY MOUTH DAILY WITH BREAKFAST 360 tablet 0   Naproxen Sodium (ALEVE PO) Take 2 tablets by mouth daily.      omeprazole (PRILOSEC) 40 MG capsule TAKE 1 CAPSULE(40 MG) BY MOUTH DAILY 90 capsule 1   repaglinide (PRANDIN) 2 MG tablet Take 1 tablet (2 mg total) by mouth 2 (two) times daily before a meal. 180 tablet 3   rosuvastatin (CRESTOR) 40 MG tablet TAKE 1 TABLET(40 MG) BY MOUTH AT BEDTIME 90 tablet 1   Semaglutide (RYBELSUS) 7 MG TABS Take 7 mg by mouth daily. 90 tablet 3   traZODone (DESYREL) 50 MG tablet Take 0.5-1 tablets (25-50 mg total) by  mouth at bedtime as needed for sleep.  30 tablet 3   No facility-administered medications prior to visit.    Allergies  Allergen Reactions   Codeine Nausea Only    Severe nausea    Review of Systems  Constitutional:  Negative for fever and malaise/fatigue.  HENT:  Negative for congestion.   Eyes:  Negative for blurred vision.  Respiratory:  Negative for shortness of breath.   Cardiovascular:  Negative for chest pain, palpitations and leg swelling.  Gastrointestinal:  Negative for abdominal pain, blood in stool and nausea.  Genitourinary:  Negative for dysuria, frequency and urgency.  Musculoskeletal:  Negative for falls.  Skin:  Negative for rash.       (+)darkened spots on her vagina  Neurological:  Negative for dizziness, loss of consciousness and headaches.  Endo/Heme/Allergies:  Negative for environmental allergies.  Psychiatric/Behavioral:  Negative for depression. The patient is not nervous/anxious.       Objective:    Physical Exam Vitals and nursing note reviewed. Exam conducted with a chaperone present.  Constitutional:      General: She is not in acute distress.    Appearance: Normal appearance. She is not ill-appearing.  HENT:     Head: Normocephalic and atraumatic.     Right Ear: External ear normal.     Left Ear: External ear normal.  Eyes:     Extraocular Movements: Extraocular movements intact.     Pupils: Pupils are equal, round, and reactive to light.  Genitourinary:    Exam position: Lithotomy position.     Comments: Hemangioma on vaginal area---  tiny No d/c No errythema  Skin:    General: Skin is warm and dry.  Neurological:     Mental Status: She is alert and oriented to person, place, and time.  Psychiatric:        Behavior: Behavior normal.        Judgment: Judgment normal.    BP 114/78 (BP Location: Left Arm, Patient Position: Sitting, Cuff Size: Normal)   Pulse 73   Temp (!) 97.4 F (36.3 C) (Oral)   Resp 18   Ht 5\' 6"  (1.676 m)    Wt 148 lb 3.2 oz (67.2 kg)   SpO2 98%   BMI 23.92 kg/m  Wt Readings from Last 3 Encounters:  06/02/21 148 lb 3.2 oz (67.2 kg)  05/23/21 148 lb 9.6 oz (67.4 kg)  05/13/21 151 lb 3.2 oz (68.6 kg)    Diabetic Foot Exam - Simple   No data filed    Lab Results  Component Value Date   WBC 12.2 (H) 04/30/2021   HGB 12.5 04/30/2021   HCT 38.4 04/30/2021   PLT 247 04/30/2021   GLUCOSE 112 (H) 04/30/2021   CHOL 113 02/03/2021   TRIG 139.0 02/03/2021   HDL 39.50 02/03/2021   LDLDIRECT 87.0 06/07/2020   LDLCALC 46 02/03/2021   ALT 33 02/03/2021   AST 27 02/03/2021   NA 141 04/30/2021   K 4.0 04/30/2021   CL 104 04/30/2021   CREATININE 0.88 04/30/2021   BUN 12 04/30/2021   CO2 27 04/30/2021   TSH 3.66 02/03/2021   HGBA1C 5.9 (A) 03/22/2021   MICROALBUR 1.1 04/17/2019    Lab Results  Component Value Date   TSH 3.66 02/03/2021   Lab Results  Component Value Date   WBC 12.2 (H) 04/30/2021   HGB 12.5 04/30/2021   HCT 38.4 04/30/2021   MCV 89.1 04/30/2021   PLT 247 04/30/2021   Lab Results  Component Value Date  NA 141 04/30/2021   K 4.0 04/30/2021   CO2 27 04/30/2021   GLUCOSE 112 (H) 04/30/2021   BUN 12 04/30/2021   CREATININE 0.88 04/30/2021   BILITOT 0.4 02/03/2021   ALKPHOS 56 02/03/2021   AST 27 02/03/2021   ALT 33 02/03/2021   PROT 7.3 02/03/2021   ALBUMIN 4.5 02/03/2021   CALCIUM 9.7 04/30/2021   ANIONGAP 10 04/30/2021   GFR 73.31 02/03/2021   Lab Results  Component Value Date   CHOL 113 02/03/2021   Lab Results  Component Value Date   HDL 39.50 02/03/2021   Lab Results  Component Value Date   LDLCALC 46 02/03/2021   Lab Results  Component Value Date   TRIG 139.0 02/03/2021   Lab Results  Component Value Date   CHOLHDL 3 02/03/2021   Lab Results  Component Value Date   HGBA1C 5.9 (A) 03/22/2021       Assessment & Plan:   Problem List Items Addressed This Visit       Unprioritized   Angioma of skin - Primary    Benign If  they get bigger or change color etc --- rto         No orders of the defined types were placed in this encounter.   I, Dr. Roma Schanz, DO, personally preformed the services described in this documentation.  All medical record entries made by the scribe were at my direction and in my presence.  I have reviewed the chart and discharge instructions (if applicable) and agree that the record reflects my personal performance and is accurate and complete. 06/02/2021   I,Shehryar Baig,acting as a scribe for Ann Held, DO.,have documented all relevant documentation on the behalf of Ann Held, DO,as directed by  Ann Held, DO while in the presence of Ann Held, DO.   Ann Held, DO

## 2021-06-02 NOTE — Patient Instructions (Signed)
Cherry Angioma A cherry angioma is a harmless growth on the skin. It is made up of blood vessels. Cherry angiomas can appear anywhere on the body, but they usually appear on the trunk and arms. What are the causes? The cause of this condition is not known, but it seems to be related to advancing age. What increases the risk? You are more likely to develop this condition if you: Are over the age of 4. Have a family member with this condition. What are the signs or symptoms?  Symptoms of this condition include harmless growths that are: Smooth, round, and red or purplish-red. As small as the tip of a pin or as big as a pencil eraser. How is this diagnosed? This condition is diagnosed with a skin exam. Rarely, a piece of the cherry angioma may be removed for testing if it is not clear that the growth is a cherry angioma. How is this treated? Treatment is not needed for this condition. If you do not like the way a cherry angioma looks, you may have it removed. Removal methods include: A method where heat is used to burn the cherry angioma off the skin (electrocautery). A method where the cherry angioma is frozen (cryosurgery). This causes it to eventually fall off the skin. A method where a laser is used to destroy the red blood cells and blood vessels in the angioma (laser therapy). A minor surgical procedure. A scalpel is used to remove the cherry angioma off the skin. A cherry angioma may come back after it has been removed. Follow these instructions at home: If you have a cherry angioma removed, keep the area clean and follow any other care instructions as told by your health care provider. Take over-the-counter and prescription medicines only as told by your health care provider. Keep all follow-up visits as told by your health care provider. This is important. Summary A cherry angioma is a harmless growth on the skin that is made up of blood vessels. Treatment is not needed for this  condition. If you do not like the way a cherry angioma looks, you may have it removed. If you have a cherry angioma removed, follow any care instructions as told by your health care provider. This information is not intended to replace advice given to you by your health care provider. Make sure you discuss any questions you have with your health care provider. Document Revised: 01/01/2018 Document Reviewed: 01/01/2018 Elsevier Patient Education  [place].

## 2021-06-08 ENCOUNTER — Other Ambulatory Visit: Payer: Self-pay | Admitting: Endocrinology

## 2021-06-08 ENCOUNTER — Ambulatory Visit (INDEPENDENT_AMBULATORY_CARE_PROVIDER_SITE_OTHER)
Admission: RE | Admit: 2021-06-08 | Discharge: 2021-06-08 | Disposition: A | Discharge status: Home / Self Care | Payer: Medicare Other | Source: Ambulatory Visit | Attending: Acute Care | Admitting: Acute Care

## 2021-06-08 ENCOUNTER — Other Ambulatory Visit: Payer: Self-pay

## 2021-06-08 DIAGNOSIS — Z87891 Personal history of nicotine dependence: Secondary | ICD-10-CM | POA: Diagnosis not present

## 2021-06-10 ENCOUNTER — Telehealth: Payer: Self-pay

## 2021-06-10 NOTE — Telephone Encounter (Signed)
Pt advised that [facility] had arrived. Medication placed at the front for pick up.

## 2021-06-14 ENCOUNTER — Ambulatory Visit (INDEPENDENT_AMBULATORY_CARE_PROVIDER_SITE_OTHER): Payer: Medicare Other | Admitting: Pharmacist

## 2021-06-14 DIAGNOSIS — E785 Hyperlipidemia, unspecified: Secondary | ICD-10-CM

## 2021-06-14 DIAGNOSIS — E118 Type 2 diabetes mellitus with unspecified complications: Secondary | ICD-10-CM

## 2021-06-14 DIAGNOSIS — E039 Hypothyroidism, unspecified: Secondary | ICD-10-CM

## 2021-06-14 NOTE — Chronic Care Management (AMB) (Signed)
Chronic Care Management Pharmacy Note  06/14/2021 Name:  Alyssa Ford MRN:  [id number] DOB:  09-22-1950  Subjective: Alyssa Ford is an 70 y.o. year old female who is a primary patient of Ann Held, DO.  The CCM team was consulted for assistance with disease management and care coordination needs.    Engaged with patient by telephone for follow up visit in response to provider referral for pharmacy case management and/or care coordination services.   Consent to Services:  The patient was given information about Chronic Care Management services, agreed to services, and gave verbal consent prior to initiation of services.  Please see initial visit note for detailed documentation.   Patient Care Team: Carollee Herter, Alferd Apa, DO as PCP - General Calvert Cantor, MD as Consulting Physician (Ophthalmology) Levy Sjogren, MD as Referring Physician (Dermatology) Ilda Foil Sol Blazing, MD as Referring Physician (Pediatrics) Renato Shin, MD as Consulting Physician (Endocrinology) Cherre Robins, [place] (Pharmacist)  Recent office visits: 06/10/2021 - Phone Call - Rybelsus PAP shipment arrived and patient notified.  06/02/2021 - Fam Med (Dr Carollee Herter) Seen for angioma of skin. No medication changes.  05/23/2021 - PCP (Dr Etter Sjogren)  seen for vaginal itching. Prescribed fluconazole 150mg  for 1 dose, may repeat in 3 days if needed. Urine dip negative. 05/13/2021 - PCP (Dr Etter Sjogren)  seen for insomnia and hospital follow up. Increased trazodone to 50mg  daily.  Referred to neurology for sleep study. Also prescribed ciprofloxacin 500mg  every 12 hours for UTI.  02/03/2021 - PCP (Dr Etter Sjogren) F/U chronic conditions. No medication changes. BMP, TSH and lipids checked. ER Follow-up - chest pain. Referred to cardio for workup of CP; Patient also mentioned BG elevated. Recommended take acarbose 100mg  tid for BG over 300. F/U with endo in 3 days (Monday).  Recent consult  visits: 11/17/2020 - Endo (Dr Loanne Drilling) added levothyroxine 57mcg daily; No change in meds for DM; A1c was 6.5% - much improved! 08/24/2020 - Endo (Dr Loanne Drilling) - f/u diabetes; Increased Rybelsus form 3mg  daily to 7mg  daily. 08/19/2020 - Cardio (Dr Johnsie Cancel) Initial evaluation of CP. No med changes  Hospital visits: 04/30/2021 - ED Visit for hematuria. Prescribed cephalexin 500mg  4 times a day  Objective:  Lab Results  Component Value Date   CREATININE 0.88 04/30/2021   CREATININE 0.81 02/03/2021   CREATININE 0.94 09/06/2020    Lab Results  Component Value Date   HGBA1C 5.9 (A) 03/22/2021   Last diabetic Eye exam:  Lab Results  Component Value Date/Time   HMDIABEYEEXA Retinopathy (A) 06/17/2018 12:00 AM    Last diabetic Foot exam:  Lab Results  Component Value Date/Time   HMDIABFOOTEX normal 06/06/2010 12:00 AM        Component Value Date/Time   CHOL 113 02/03/2021 0846   TRIG 139.0 02/03/2021 0846   TRIG 216 (HH) 04/24/2006 0741   HDL 39.50 02/03/2021 0846   CHOLHDL 3 02/03/2021 0846   VLDL 27.8 02/03/2021 0846   LDLCALC 46 02/03/2021 0846   LDLDIRECT 87.0 06/07/2020 0838    Hepatic Function Latest Ref Rng & Units 02/03/2021 09/06/2020 08/04/2020  Total Protein 6.0 - 8.3 g/dL 7.3 7.1 7.5  Albumin 3.5 - 5.2 g/dL 4.5 4.5 4.2  AST 0 - 37 U/L 27 20 28   ALT 0 - 35 U/L 33 27 31  Alk Phosphatase 39 - 117 U/L 56 49 60  Total Bilirubin 0.2 - 1.2 mg/dL 0.4 0.4 0.2(L)  Bilirubin, Direct 0.0 - 0.3 mg/dL - - -  Lab Results  Component Value Date/Time   TSH 3.66 02/03/2021 08:46 AM   TSH 6.51 (H) 08/24/2020 07:56 AM   FREET4 1.31 08/24/2020 07:56 AM   FREET4 1.28 04/22/2020 08:12 AM    CBC Latest Ref Rng & Units 04/30/2021 08/04/2020 01/06/2020  WBC 4.0 - 10.5 K/uL 12.2(H) 6.7 9.0  Hemoglobin 12.0 - 15.0 g/dL 12.5 12.5 12.9  Hematocrit 36.0 - 46.0 % 38.4 38.2 38.8  Platelets 150 - 400 K/uL 247 257 260.0    Lab Results  Component Value Date/Time   VD25OH 43 02/23/2010  09:05 PM   VD25OH 25 (L) 07/14/2009 09:49 PM    Clinical ASCVD: Yes  The ASCVD Risk score (Arnett DK, et al., 2019) failed to calculate for the following reasons:   The valid total cholesterol range is 130 to 320 mg/dL     Social History   Tobacco Use  Smoking Status Former   Packs/day: 1.40   Years: 49.00   Pack years: 68.60   Types: Cigarettes   Quit date: 07/16/2015   Years since quitting: 5.9  Smokeless Tobacco Never  Tobacco Comments   Continues to vape but states pod do not contain nicotine   BP Readings from Last 3 Encounters:  06/02/21 114/78  05/23/21 122/70  05/13/21 120/70   Pulse Readings from Last 3 Encounters:  06/02/21 73  05/23/21 78  05/13/21 81   Wt Readings from Last 3 Encounters:  06/02/21 148 lb 3.2 oz (67.2 kg)  05/23/21 148 lb 9.6 oz (67.4 kg)  05/13/21 151 lb 3.2 oz (68.6 kg)    Assessment: Review of patient past medical history, allergies, medications, health status, including review of consultants reports, laboratory and other test data, was performed as part of comprehensive evaluation and provision of chronic care management services.   SDOH:  (Social Determinants of Health) assessments and interventions performed:     CCM Care Plan  Allergies  Allergen Reactions   Codeine Nausea Only    Severe nausea    Medications Reviewed Today     Reviewed by Cherre Robins, RPH-CPP (Pharmacist) on 06/14/21 at 71  Med List Status: <None>   Medication Order Taking? Sig Documenting Provider Last Dose Status Informant  acarbose (PRECOSE) 50 MG tablet [id number] Yes TAKE 1 TABLET(50 MG) BY MOUTH THREE TIMES DAILY WITH MEALS Renato Shin, MD Taking Active   aspirin EC 81 MG tablet [id number] Yes Take 81 mg by mouth daily. Ann Held, DO Taking Active Self  azelastine (ASTELIN) 0.1 % nasal spray [id number] Yes Place 1 spray into both nostrils 2 (two) times daily. Per ENT [provider] Taking Active   BIOTIN PO [id number] Yes Take  1 tablet by mouth daily.  [provider] Taking Active Self  bromocriptine (PARLODEL) 2.5 MG tablet [id number] Yes TAKE 1 TABLET(2.5 MG) BY MOUTH DAILY Renato Shin, MD Taking Active   Coenzyme Q10 (CO Q 10 PO) [id number] Yes Take 1 capsule by mouth daily. [provider] Taking Active   fenofibrate 160 MG tablet [id number] Yes TAKE 1 TABLET(160 MG) BY MOUTH DAILY Roma Schanz R, DO Taking Active   Ferrous Sulfate (IRON SUPPLEMENT PO) [id number] Yes Take 1 tablet by mouth daily. $RemoveBefo'65mg'OqgsSszPpWn$  [provider] Taking Active   latanoprost (XALATAN) 0.005 % ophthalmic solution [id number] Yes Place 1 drop into both eyes at bedtime.  [provider] Taking Active            Med Note Luana Shu, NATASHA   Thu Aug 19, 2020  3:42 PM)    levothyroxine (SYNTHROID) 25 MCG tablet [id number] Yes Take 1 tablet (25 mcg total) by mouth daily. Renato Shin, MD Taking Active   lisinopril (ZESTRIL) 5 MG tablet [id number] Yes Take 1 tablet (5 mg total) by mouth daily. Carollee Herter, Kendrick Fries R, DO Taking Active   metFORMIN (GLUCOPHAGE-XR) 500 MG 24 hr tablet [id number] Yes TAKE 4 TABLETS(2000 MG) BY MOUTH DAILY WITH Paticia Stack, MD Taking Active   Naproxen Sodium (ALEVE PO) [id number] Yes Take 2 tablets by mouth daily.  [provider] Taking Active Self           Med Note Dorothyann Peng, MARTHA A   Tue Jan 18, 2021  9:00 AM) Taking as needed  omeprazole (PRILOSEC) 40 MG capsule [id number] Yes TAKE 1 CAPSULE(40 MG) BY MOUTH DAILY Carollee Herter, Alferd Apa, DO Taking Active   repaglinide (PRANDIN) 2 MG tablet [id number] Yes Take 1 tablet (2 mg total) by mouth 2 (two) times daily before a meal. Renato Shin, MD Taking Active   rosuvastatin (CRESTOR) 40 MG tablet [id number] Yes TAKE 1 TABLET(40 MG) BY MOUTH AT BEDTIME Carollee Herter, Kendrick Fries R, DO Taking Active   Semaglutide (RYBELSUS) 7 MG TABS [id number] Yes Take 7 mg by mouth daily. Renato Shin, MD Taking Active            Med Note [facility],  Jenene Slicker Jan 10, 2021 11:52 AM) Receiving from NovoNordisk PAP thru 05/2021  traZODone (DESYREL) 50 MG tablet [id number] Yes Take 0.5-1 tablets (25-50 mg total) by mouth at bedtime as needed for sleep. Ann Held, DO Taking Active             Patient Active Problem List   Diagnosis Date Noted   Angioma of skin 06/02/2021   Vaginal itching 05/23/2021   Hematuria 05/23/2021   Insomnia 05/13/2021   Hypothyroidism 08/24/2020   Chest pain 08/06/2020   Trichimoniasis 04/27/2020   Cervical cancer screening 04/27/2020   Left thyroid nodule 04/13/2020   Vaginal odor 01/11/2020   Icterus 01/11/2020   Uncontrolled type 2 diabetes mellitus with hyperglycemia ([place]) 09/22/2019   De Quervain's disease (tenosynovitis) 09/22/2019   Neck mass 07/11/2018   Abnormal WBC count 05/29/2017   Skin fissure 09/24/2015   Allergic rhinitis 03/26/2014   Wheezing 03/26/2014   DM (diabetes mellitus) type II uncontrolled, periph vascular disorder 08/25/2011   Headache(784.0) 08/21/2011   Acute sinusitis 04/26/2010   URI 04/26/2010   UNSPECIFIED CATARACT 02/23/2010   POSTMENOPAUSAL STATUS 12/12/2007   FATIGUE 09/11/2007   THYROID NODULE 11/22/2006   Diabetes ([place]) 11/22/2006   Hyperlipidemia LDL goal <70 11/22/2006   GLUCOMA 11/22/2006   Essential hypertension 11/22/2006   GERD 11/22/2006    Immunization History  Administered Date(s) Administered   Fluad Quad(high Dose 65+) 04/08/2019, 04/08/2020   Influenza Split 04/13/2011   Influenza Whole 04/17/2006   Influenza, High Dose Seasonal PF 04/12/2016, 05/07/2017, 04/08/2018   Influenza, Seasonal, Injecte, Preservative Fre 05/22/2012   Influenza,inj,Quad PF,6+ Mos 03/03/2014, 03/18/2015   Influenza-Unspecified 05/07/2017, 05/02/2021   PFIZER(Purple Top)SARS-COV-2 Vaccination 07/21/2019, 08/11/2019, 04/27/2020, 11/23/2020   Pfizer Covid-19 Vaccine Bivalent Booster 61yrs & up 05/03/2021   Pneumococcal Conjugate-13 09/13/2015,  05/07/2017, 04/15/2019   Pneumococcal Polysaccharide-23 03/31/2003, 11/03/2008, 05/07/2017   Td 10/24/1999, 02/23/2010   Tdap 02/21/2019   Zoster Recombinat (Shingrix) 03/09/2017, 06/23/2017   Zoster, Live 12/25/2011    Conditions to be addressed/monitored: CAD, HTN and DMII  Care Plan : General Pharmacy (Adult)  Updates  made by Cherre Robins, RPH-CPP since 06/14/2021 12:00 AM     Problem: Medication management and chronic disease management, support, education and care coordination for diabetes, HTN, HDL, nerve pain, anxiety, glaucoma, insomnia and GERD   Priority: High  Onset Date: 11/10/2020  Note:   Current Barriers:  Unable to maintain control of type 2 DM (A1c improved and at goal as of 11/17/20) Difficulty sleeping not improved with current therapy Chronic Disease Management support, education, and care coordination needed in related to Diabetes, Hypertension, Hyperlipidemia, Nerve Pain, Anxiety, Glaucoma, GERD, Insomnia  Pharmacist Clinical Goal(s):  Over the next 90 days, patient will verbalize ability to afford treatment regimen achieve control of diabetes as evidenced by A1c < 7.0% maintain control of hyperlipidemia and HTN as evidenced by maintaining goals listed below  Improve sleep duration and quality  through collaboration with PharmD and provider.   Interventions: 1:1 collaboration with Carollee Herter, Alferd Apa, DO regarding development and update of comprehensive plan of care as evidenced by provider attestation and co-signature Inter-disciplinary care team collaboration (see longitudinal plan of care) Comprehensive medication review performed; medication list updated in electronic medical record  Hypertension Controlled; BP goal <140/90 Current regimen:  Lisinopril 5mg  daily in morning Interventions:  Check blood pressure weekly, document, and provide at future appointments Ensure daily salt intake < 2300 mg/day  Hyperlipidemia Controlled; LDL goal < 100 and  Triglycerides <150 Current regimen:  Rosuvastatin 40mg  daily (for LDL / 'bad' cholesterol) Fenofibrate 160mg  dialy (for triglycerides) Coenzyne Q10 200mg  daily Interventions: Reviewed goals for triglycerides and LDL. Maintain current cholesterol medication regimen.   Diabetes As of 09/278/2022 A1c was 5.9%;  A1c goal <7%  Managed by Dr Loanne Drilling - next appt 07/28/2021 Recent home BG readings have been improving Checking every other day Ranging from 105 to 140 Denies hypoglycemia symptoms She does report that she has noticed decreased appetite since starting Rybelsus. Sometimes she feel fullness but knows that Rybelsus is working well.  Patient has been enrolled since June 2022 with [employer] medication assistance. Received first shipment around 01/05/2021. Noted to have received shipment 06/10/2021.  Current regimen:  Acarbose 50mg  three times a day with first bite of each meal Metformin XR 500 - take four tablets with morning meal Repaglinide 2mg  - take 2 tablets 30 minutes before each meal Bromocriptine 2.5mg  daily Rybelsus 7mg  - take daily 30 minutes before morning meal Interventions;  Reviewed appropriate timing of medication administration to get best blood glucose lowering effects Recommended to check blood sugar daily, document, and provide at future appointments Reminded patient to pick up Rybelsus from patient assistance program. She also plans to bring patient assistance program application for 5631 when she picks up Rybelsus If A1c continues to be <7.0 - might be able to consider lowering dose / or stopping acarbose to see if this helps with fullness / decreased appetite  - though I did explain to patient that she is how Rybelsus works to improve blood glucose.  Continue to walk daily to achieve goal of 150 minutes per week of moderate intensity exercise (like walking)  GERD (goal -  reduce symptoms of GERD) Controlled Denies breakthrough GERD symptoms except when she  occasionally forgets to take omeprazole Current regimen:  Omeprazole 40mg  daily Interventions: (addressed at previous visit) Maintain current medication regimen for acid relfux  Thyroid Health:  Lab Results  Component Value Date   TSH 3.66 02/03/2021  Dr Loanne Drilling started thyroid supplementation 11/17/20 Current regimen:  Levothyroxine 12mcg daily - started 11/17/2020  Interventions: Patient advised to hold Biotin at least 1 week prior to any thyroid testing Follow up with Dr Loanne Drilling as planned.    Medication management Current pharmacy: [facility] Interventions Comprehensive medication review performed. Continue current medication management strategy Reviewed medication refill history to assess adherence  Patient Goals/Self-Care Activities Over the next 90 days, patient will:  take medications as prescribed, check glucose daily, document, and provide at future appointments, and collaborate with provider on medication access solutions  Follow Up Plan: Telephone follow up appointment with care management team member scheduled for:  1 month to check on patient assistance          Medication Assistance: Rybelsus has been approved thru [employer] - initial approval 11/2020 - first shipment 01/05/2021. Approved through 05/25/2021. Received last shipment in PCP office of Rybelsus for 2022 on 06/10/2021 - patient will pick up later this week.  Patient provided with application for 1975.   Patient's preferred pharmacy is:  [facility] [place], [address] [place] [address] Phone: [phone] Fax: [phone]    Follow Up:  Patient agrees to Care Plan and Follow-up.  Plan: The care management team will reach out to the patient again over the next 30 days.   Cherre Robins, PharmD Clinical Pharmacist [place] [phone]

## 2021-06-14 NOTE — Patient Instructions (Signed)
Alyssa Ford It was a pleasure speaking with you today.  I have attached a summary of our visit today and information about your health goals.   If you have any questions or concerns, please feel free to contact me either at the phone number below or with a MyChart message.   Keep up the good work!  Cherre Robins, PharmD Clinical Pharmacist [facility] [phone] (direct line)  [phone] (main office number)  CARE PLAN ENTRY (see longitudinal plan of care for additional care plan information)  Current Barriers:  Chronic Disease Management support, education, and care coordination needs related to Diabetes, Hypertension, Hyperlipidemia, Nerve Pain, Anxiety, Glaucoma, GERD, Insomnia   Hypertension BP Readings from Last 3 Encounters:  06/02/21 114/78  05/23/21 122/70  05/13/21 120/70   Pharmacist Clinical Goal(s): Over the next 90 days, patient will work with PharmD and providers to maintain BP goal <140/90 Current regimen:  Lisinopril 5mg  daily in morning Patient self care activities - Over the next 90 days, patient will: Check blood pressure weekly, document, and provide at future appointments Ensure daily salt intake < 2300 mg/day  Hyperlipidemia Lab Results  Component Value Date   CHOL 113 02/03/2021   CHOL 110 09/06/2020   CHOL 163 06/07/2020   Lab Results  Component Value Date   HDL 39.50 02/03/2021   HDL 38.20 (L) 09/06/2020   HDL 41.60 06/07/2020   Lab Results  Component Value Date   LDLCALC 46 02/03/2021   [place] 46 09/06/2020   [place] 59 09/22/2019   Lab Results  Component Value Date   TRIG 139.0 02/03/2021   TRIG 128.0 09/06/2020   TRIG 259.0 (H) 06/07/2020   Pharmacist Clinical Goal(s): Over the next 90 days, patient will work with PharmD and providers to maintain LDL goal < 100 and Triglycerides <150 Current regimen:  Rosuvastatin 40mg  daily (for LDL / 'bad' cholesterol) Fenofibrate 160mg  dialy (for  triglycerides) Coenzyne Q10 200mg  daily Interventions: Reviewed goals for triglycerides and LDL. Maintain current cholesterol medication regimen.   Diabetes Lab Results  Component Value Date   HGBA1C 5.9 (A) 03/22/2021   Pharmacist Clinical Goal(s): Over the next 90 days, patient will work with PharmD and providers to maintain A1c goal <7% Current regimen:  Acarbose 50mg  three times a day with first bite of each meal Metformin XR 500 - take four tablets with morning meal Repaglinide 2mg  - take 2 tablets 30 minutes before each meal Bromocriptine 2.5mg  daily Rybelsus 7mg  - take daily 30 minutes before morning meal Patient self care activities - Over the next 90 days, patient will: Check blood sugar once daily, document, and provide at future appointments Contact provider with any episodes of hypoglycemia Drop off completed application for Rybelsus patient assistance program for 2023. Pick up 06/10/2021 delivery of Rybelsus patient assistance program at PCP office.  Increase exercise frequency to achieve goal of 150 minutes per week of moderate intensity exercise (like walking)  GERD Pharmacist Clinical Goal(s) Over the next 90 days, patient will work with PharmD and providers to reduce symptoms of GERD Current regimen:  Omeprazole 40mg  daily Patient self care activities - Over the next 90 days, patient will: Maintain current medication regimen acid relfux  Thyroid Health:   Lab Results  Component Value Date   TSH 3.66 02/03/2021  Pharmacist Clinical Goal(s) Over the next 90 days, patient will work with PharmD and providers to supplement thyroid hormone and improve sleep Current regimen:  Levotyroxine 25 mcg daily in morning -  started 11/17/2020 Interventions: Patient advised to hold Biotin for 1 week prior to thyroid testing  Patient self care activities - Over the next 90 days, patient will: Do not take Biotin 1 week prior to thyroid testing Follow up with Dr Loanne Drilling as  planned.   Medication management Pharmacist Clinical Goal(s): Over the next 90 days, patient will work with PharmD and providers to maintain optimal medication adherence Current pharmacy: [facility] Interventions Comprehensive medication review performed. Continue current medication management strategy Patient self care activities - Over the next 90 days, patient will: Focus on medication adherence by filling medications appropriately  Take medications as prescribed Report any questions or concerns to PharmD and/or provider(s)  The patient verbalized understanding of instructions, educational materials, and care plan provided today and declined offer to receive copy of patient instructions, educational materials, and care plan.

## 2021-06-15 ENCOUNTER — Other Ambulatory Visit: Payer: Self-pay | Admitting: Acute Care

## 2021-06-15 DIAGNOSIS — Z87891 Personal history of nicotine dependence: Secondary | ICD-10-CM

## 2021-06-16 ENCOUNTER — Telehealth: Payer: Self-pay | Admitting: Family Medicine

## 2021-06-16 NOTE — Telephone Encounter (Signed)
Pt's spouse dropped off Assistance program form for [employer] (pharmacist) to fill out. Document put at front office under Pharmacist name.

## 2021-06-25 DIAGNOSIS — E785 Hyperlipidemia, unspecified: Secondary | ICD-10-CM

## 2021-06-25 DIAGNOSIS — E039 Hypothyroidism, unspecified: Secondary | ICD-10-CM

## 2021-06-25 DIAGNOSIS — E118 Type 2 diabetes mellitus with unspecified complications: Secondary | ICD-10-CM

## 2021-07-04 ENCOUNTER — Ambulatory Visit (INDEPENDENT_AMBULATORY_CARE_PROVIDER_SITE_OTHER): Payer: Medicare Other | Admitting: Family Medicine

## 2021-07-04 ENCOUNTER — Encounter: Payer: Self-pay | Admitting: Family Medicine

## 2021-07-04 VITALS — BP 120/60 | HR 79 | Temp 98.1°F | Resp 18 | Ht 65.0 in | Wt 149.4 lb

## 2021-07-04 DIAGNOSIS — E1169 Type 2 diabetes mellitus with other specified complication: Secondary | ICD-10-CM | POA: Diagnosis not present

## 2021-07-04 DIAGNOSIS — E785 Hyperlipidemia, unspecified: Secondary | ICD-10-CM

## 2021-07-04 DIAGNOSIS — I272 Pulmonary hypertension, unspecified: Secondary | ICD-10-CM

## 2021-07-04 DIAGNOSIS — E1165 Type 2 diabetes mellitus with hyperglycemia: Secondary | ICD-10-CM | POA: Diagnosis not present

## 2021-07-04 DIAGNOSIS — I700 Atherosclerosis of aorta: Secondary | ICD-10-CM | POA: Diagnosis not present

## 2021-07-04 DIAGNOSIS — I10 Essential (primary) hypertension: Secondary | ICD-10-CM | POA: Diagnosis not present

## 2021-07-04 NOTE — Assessment & Plan Note (Signed)
Pt on aspirin 81 ng and statin

## 2021-07-04 NOTE — Patient Instructions (Signed)

## 2021-07-04 NOTE — Progress Notes (Signed)
Subjective:   By signing my name below, I, Zite Okoli, attest that this documentation has been prepared under the direction and in the presence of Ann Held, DO. 07/04/2021     Patient ID: Alyssa Ford, female    DOB: 10-02-1950, 71 y.o.   MRN: [id number]  Chief Complaint  Patient presents with   Imaging   Follow-up    HPI Patient is in today for an office visit and a CAT scan f/u.  Her last lab work showed that her hemoglobin levels are still low.  She had a CAT scan of her chest and lung on 06/08/2021 and would like to discuss the results.  She reports that she has been feeling sick in her stomach and loss of appetite since she started 7 mg rybelsus.   Her blood pressure and blood sugar levels are stable.   She has received the flu vaccine and has 5 Covid-19 vaccines at this time.  She has an upcoming eye appointment on 01/11.  Past Medical History:  Diagnosis Date   Anemia    Anxiety    Arthritis    Cataract    Chronic headaches    Depression    Diabetes mellitus    Diverticulosis of colon (without mention of hemorrhage) 2003, 2008   Colonoscopy   GERD (gastroesophageal reflux disease)    Glaucoma    Hiatal hernia 2003   EGD    Hyperlipidemia    Hypertension    Thyroid nodule     Past Surgical History:  Procedure Laterality Date   CARPAL TUNNEL RELEASE  2006   CARPAL TUNNEL RELEASE  12/24/2007   CATARACT EXTRACTION, BILATERAL     [place]---  sept and oct 2018   CERVICAL FUSION  3/08   CHOLECYSTECTOMY     FASCIECTOMY Left 08/01/2018   Procedure: FASCIECTOMY LEFT RING;  Surgeon: Daryll Brod, MD;  Location: [facility];  Service: Orthopedics;  Laterality: Left;  UPPER ARM   TRIGGER FINGER RELEASE Left 08/01/2018   Procedure: RELEASE TRIGGER FINGER/A-1 PULLEY [place];  Surgeon: Daryll Brod, MD;  Location: [facility];  Service: Orthopedics;  Laterality: Left;    Family History  Problem Relation Age of Onset    Dementia Mother    Breast cancer Mother 40   Kidney failure Mother 80       died from kidney failure from abx given to her for pneumonia   Hyperlipidemia Mother    Hypertension Mother    Diabetes Mother    Cancer Mother 37       breast   Parkinsonism Father    Diabetes Father 45   Hypertension Sister    Diabetes Brother    Hypertension Brother    Heart attack Brother    Heart disease Cousin 43       mi   Colon cancer Neg Hx    Esophageal cancer Neg Hx    Stomach cancer Neg Hx     Social History   Socioeconomic History   Marital status: Married    Spouse name: Not on file   Number of children: Not on file   Years of education: Not on file   Highest education level: Not on file  Occupational History   Occupation: retired  Tobacco Use   Smoking status: Former    Packs/day: 1.40    Years: 49.00    Pack years: 68.60    Types: Cigarettes    Quit date: 07/16/2015  Years since quitting: 5.9   Smokeless tobacco: Never   Tobacco comments:    Continues to vape but states pod do not contain nicotine  Vaping Use   Vaping Use: Every day  Substance and Sexual Activity   Alcohol use: Yes    Alcohol/week: 0.0 standard drinks    Comment: once weekly; wine and beer   Drug use: No   Sexual activity: Not Currently    Partners: Male  Other Topics Concern   Not on file  Social History Narrative   Exercise--  walk   Social Determinants of Health   Financial Resource Strain: Low Risk    Difficulty of Paying Living Expenses: Not very hard  Food Insecurity: No Food Insecurity   Worried About Charity fundraiser in the Last Year: Never true   Ran Out of Food in the Last Year: Never true  Transportation Needs: No Transportation Needs   Lack of Transportation (Medical): No   Lack of Transportation (Non-Medical): No  Physical Activity: Insufficiently Active   Days of Exercise per Week: 4 days   Minutes of Exercise per Session: 20 min  Stress: No Stress Concern Present    Feeling of Stress : Only a little  Social Connections: Moderately Isolated   Frequency of Communication with Friends and Family: More than three times a week   Frequency of Social Gatherings with Friends and Family: More than three times a week   Attends Religious Services: Never   Marine scientist or Organizations: No   Attends Music therapist: Never   Marital Status: Married  Human resources officer Violence: Not At Risk   Fear of Current or Ex-Partner: No   Emotionally Abused: No   Physically Abused: No   Sexually Abused: No    Outpatient Medications Prior to Visit  Medication Sig Dispense Refill   acarbose (PRECOSE) 50 MG tablet TAKE 1 TABLET(50 MG) BY MOUTH THREE TIMES DAILY WITH MEALS 270 tablet 3   aspirin EC 81 MG tablet Take 81 mg by mouth daily.     azelastine (ASTELIN) 0.1 % nasal spray Place 1 spray into both nostrils 2 (two) times daily. Per ENT     BIOTIN PO Take 1 tablet by mouth daily.      bromocriptine (PARLODEL) 2.5 MG tablet TAKE 1 TABLET(2.5 MG) BY MOUTH DAILY 90 tablet 3   Coenzyme Q10 (CO Q 10 PO) Take 1 capsule by mouth daily.     fenofibrate 160 MG tablet TAKE 1 TABLET(160 MG) BY MOUTH DAILY 90 tablet 1   Ferrous Sulfate (IRON SUPPLEMENT PO) Take 1 tablet by mouth daily. 65mg      latanoprost (XALATAN) 0.005 % ophthalmic solution Place 1 drop into both eyes at bedtime.      levothyroxine (SYNTHROID) 25 MCG tablet Take 1 tablet (25 mcg total) by mouth daily. 90 tablet 3   lisinopril (ZESTRIL) 5 MG tablet Take 1 tablet (5 mg total) by mouth daily. 90 tablet 1   metFORMIN (GLUCOPHAGE-XR) 500 MG 24 hr tablet TAKE 4 TABLETS(2000 MG) BY MOUTH DAILY WITH BREAKFAST 360 tablet 0   Naproxen Sodium (ALEVE PO) Take 2 tablets by mouth daily.      omeprazole (PRILOSEC) 40 MG capsule TAKE 1 CAPSULE(40 MG) BY MOUTH DAILY 90 capsule 1   repaglinide (PRANDIN) 2 MG tablet Take 1 tablet (2 mg total) by mouth 2 (two) times daily before a meal. 180 tablet 3    rosuvastatin (CRESTOR) 40 MG tablet TAKE 1 TABLET(40 MG)  BY MOUTH AT BEDTIME 90 tablet 1   Semaglutide (RYBELSUS) 7 MG TABS Take 7 mg by mouth daily. 90 tablet 3   traZODone (DESYREL) 50 MG tablet Take 0.5-1 tablets (25-50 mg total) by mouth at bedtime as needed for sleep. (Patient not taking: Reported on 07/04/2021) 30 tablet 3   No facility-administered medications prior to visit.    Allergies  Allergen Reactions   Codeine Nausea Only    Severe nausea    Review of Systems  Constitutional:  Negative for fever.  HENT:  Negative for congestion, ear pain, hearing loss, sinus pain and sore throat.   Eyes:  Negative for blurred vision and pain.  Respiratory:  Negative for cough, sputum production, shortness of breath and wheezing.   Cardiovascular:  Negative for chest pain and palpitations.  Gastrointestinal:  Positive for nausea. Negative for blood in stool, constipation, diarrhea and vomiting.  Genitourinary:  Negative for dysuria, frequency, hematuria and urgency.  Musculoskeletal:  Negative for back pain, falls and myalgias.  Neurological:  Negative for dizziness, sensory change, loss of consciousness, weakness and headaches.  Endo/Heme/Allergies:  Negative for environmental allergies. Does not bruise/bleed easily.  Psychiatric/Behavioral:  Negative for depression and suicidal ideas. The patient is not nervous/anxious and does not have insomnia.       Objective:    Physical Exam Constitutional:      General: She is not in acute distress.    Appearance: Normal appearance. She is not ill-appearing.  HENT:     Head: Normocephalic and atraumatic.     Right Ear: External ear normal.     Left Ear: External ear normal.  Eyes:     Extraocular Movements: Extraocular movements intact.     Pupils: Pupils are equal, round, and reactive to light.  Cardiovascular:     Rate and Rhythm: Normal rate and regular rhythm.     Pulses: Normal pulses.     Heart sounds: Normal heart sounds. No  murmur heard.   No gallop.  Pulmonary:     Effort: Pulmonary effort is normal. No respiratory distress.     Breath sounds: Normal breath sounds. No wheezing, rhonchi or rales.  Abdominal:     General: Bowel sounds are normal. There is no distension.     Palpations: Abdomen is soft. There is no mass.     Tenderness: There is no abdominal tenderness. There is no guarding or rebound.     Hernia: No hernia is present.  Musculoskeletal:     Cervical back: Normal range of motion and neck supple.  Lymphadenopathy:     Cervical: No cervical adenopathy.  Skin:    General: Skin is warm and dry.  Neurological:     Mental Status: She is alert and oriented to person, place, and time.  Psychiatric:        Behavior: Behavior normal.    BP 120/60 (BP Location: Left Arm, Patient Position: Sitting, Cuff Size: Normal)    Pulse 79    Temp 98.1 F (36.7 C) (Oral)    Resp 18    Ht 5\' 5"  (1.651 m)    Wt 149 lb 6.4 oz (67.8 kg)    SpO2 97%    BMI 24.86 kg/m  Wt Readings from Last 3 Encounters:  07/04/21 149 lb 6.4 oz (67.8 kg)  06/02/21 148 lb 3.2 oz (67.2 kg)  05/23/21 148 lb 9.6 oz (67.4 kg)    Diabetic Foot Exam - Simple   No data filed    Lab Results  Component Value Date   WBC 12.2 (H) 04/30/2021   HGB 12.5 04/30/2021   HCT 38.4 04/30/2021   PLT 247 04/30/2021   GLUCOSE 112 (H) 04/30/2021   CHOL 113 02/03/2021   TRIG 139.0 02/03/2021   HDL 39.50 02/03/2021   LDLDIRECT 87.0 06/07/2020   LDLCALC 46 02/03/2021   ALT 33 02/03/2021   AST 27 02/03/2021   NA 141 04/30/2021   K 4.0 04/30/2021   CL 104 04/30/2021   CREATININE 0.88 04/30/2021   BUN 12 04/30/2021   CO2 27 04/30/2021   TSH 3.66 02/03/2021   HGBA1C 5.9 (A) 03/22/2021   MICROALBUR 1.1 04/17/2019    Lab Results  Component Value Date   TSH 3.66 02/03/2021   Lab Results  Component Value Date   WBC 12.2 (H) 04/30/2021   HGB 12.5 04/30/2021   HCT 38.4 04/30/2021   MCV 89.1 04/30/2021   PLT 247 04/30/2021   Lab  Results  Component Value Date   NA 141 04/30/2021   K 4.0 04/30/2021   CO2 27 04/30/2021   GLUCOSE 112 (H) 04/30/2021   BUN 12 04/30/2021   CREATININE 0.88 04/30/2021   BILITOT 0.4 02/03/2021   ALKPHOS 56 02/03/2021   AST 27 02/03/2021   ALT 33 02/03/2021   PROT 7.3 02/03/2021   ALBUMIN 4.5 02/03/2021   CALCIUM 9.7 04/30/2021   ANIONGAP 10 04/30/2021   GFR 73.31 02/03/2021   Lab Results  Component Value Date   CHOL 113 02/03/2021   Lab Results  Component Value Date   HDL 39.50 02/03/2021   Lab Results  Component Value Date   LDLCALC 46 02/03/2021   Lab Results  Component Value Date   TRIG 139.0 02/03/2021   Lab Results  Component Value Date   CHOLHDL 3 02/03/2021   Lab Results  Component Value Date   HGBA1C 5.9 (A) 03/22/2021       Assessment & Plan:   Problem List Items Addressed This Visit       Unprioritized   Atherosclerosis of aorta ([place])    Pt on aspirin 81 ng and statin       Essential hypertension    Well controlled, no changes to meds. Encouraged heart healthy diet such as the DASH diet and exercise as tolerated.       Hyperlipidemia associated with type 2 diabetes mellitus ([place])    Tolerating statin, encouraged heart healthy diet, avoid trans fats, minimize simple carbs and saturated fats. Increase exercise as tolerated      Relevant Orders   Comprehensive metabolic panel   Lipid panel   Hyperlipidemia LDL goal <70    Tolerating statin, encouraged heart healthy diet, avoid trans fats, minimize simple carbs and saturated fats. Increase exercise as tolerated      Pulmonary HTN ([place]) - Primary   Relevant Orders   Ambulatory referral to Pulmonology   Uncontrolled type 2 diabetes mellitus with hyperglycemia ([place])    hgba1c to be checked , minimize simple carbs. Increase exercise as tolerated. Continue current meds         No orders of the defined types were placed in this encounter.   I,Zite Okoli,acting as a Education administrator for [employer], DO.,have documented all relevant documentation on the behalf of Ann Held, DO,as directed by  Ann Held, DO while in the presence of [place], DO. , personally preformed the services described in  this documentation.  All medical record entries made by the scribe were at my direction and in my presence.  I have reviewed the chart and discharge instructions (if applicable) and agree that the record reflects my personal performance and is accurate and complete. 07/04/2021

## 2021-07-04 NOTE — Assessment & Plan Note (Signed)
Tolerating statin, encouraged heart healthy diet, avoid trans fats, minimize simple carbs and saturated fats. Increase exercise as tolerated 

## 2021-07-04 NOTE — Assessment & Plan Note (Signed)
Well controlled, no changes to meds. Encouraged heart healthy diet such as the DASH diet and exercise as tolerated.  °

## 2021-07-04 NOTE — Assessment & Plan Note (Signed)
hgba1c to be checked, minimize simple carbs. Increase exercise as tolerated. Continue current meds  

## 2021-07-05 LAB — LIPID PANEL
Cholesterol: 109 mg/dL (ref 0–200)
HDL: 43.7 mg/dL (ref >39.00)
LDL Cholesterol: 25 mg/dL (ref 0–99)
NonHDL: 64.82
Total CHOL/HDL Ratio: 2
Triglycerides: 197 mg/dL — ABNORMAL HIGH (ref 0.0–149.0)
VLDL: 39.4 mg/dL (ref 0.0–40.0)

## 2021-07-05 LAB — COMPREHENSIVE METABOLIC PANEL
ALT: 18 U/L (ref 0–35)
AST: 17 U/L (ref 0–37)
Albumin: 4.7 g/dL (ref 3.5–5.2)
Alkaline Phosphatase: 62 U/L (ref 39–117)
BUN: 14 mg/dL (ref 6–23)
CO2: 24 mEq/L (ref 19–32)
Calcium: 10.3 mg/dL (ref 8.4–10.5)
Chloride: 98 mEq/L (ref 96–112)
Creatinine, Ser: 0.8 mg/dL (ref 0.40–1.20)
GFR: 74.19 mL/min (ref >60.00)
Glucose, Bld: 85 mg/dL (ref 70–99)
Potassium: 3.9 mEq/L (ref 3.5–5.1)
Sodium: 133 mEq/L — ABNORMAL LOW (ref 135–145)
Total Bilirubin: 0.4 mg/dL (ref 0.2–1.2)
Total Protein: 7.1 g/dL (ref 6.0–8.3)

## 2021-07-06 DIAGNOSIS — Z961 Presence of intraocular lens: Secondary | ICD-10-CM | POA: Diagnosis not present

## 2021-07-06 DIAGNOSIS — E119 Type 2 diabetes mellitus without complications: Secondary | ICD-10-CM | POA: Diagnosis not present

## 2021-07-06 DIAGNOSIS — H02831 Dermatochalasis of right upper eyelid: Secondary | ICD-10-CM | POA: Diagnosis not present

## 2021-07-06 DIAGNOSIS — H401131 Primary open-angle glaucoma, bilateral, mild stage: Secondary | ICD-10-CM | POA: Diagnosis not present

## 2021-07-06 LAB — HM DIABETES EYE EXAM

## 2021-07-06 NOTE — Addendum Note (Signed)
Addended by: Jeronimo Greaves on: 07/06/2021 12:08 PM   Modules accepted: Orders

## 2021-07-11 ENCOUNTER — Ambulatory Visit (INDEPENDENT_AMBULATORY_CARE_PROVIDER_SITE_OTHER): Payer: Medicare Other | Admitting: Pharmacist

## 2021-07-11 DIAGNOSIS — I700 Atherosclerosis of aorta: Secondary | ICD-10-CM

## 2021-07-11 DIAGNOSIS — E1165 Type 2 diabetes mellitus with hyperglycemia: Secondary | ICD-10-CM

## 2021-07-11 NOTE — Patient Instructions (Signed)
Mrs. Maiorino It was a pleasure speaking with you today.  I have attached a summary of our visit today and information about your health goals.   Our next appointment is by telephone on February 08/05/2021   Please call the care guide team at [phone] if you need to cancel or reschedule your appointment.   If you have any questions or concerns, please feel free to contact me either at the phone number below or with a MyChart message.   Keep up the good work!  Cherre Robins, PharmD Clinical Pharmacist [facility] [phone] (direct line)  [phone] (main office number)  CARE PLAN ENTRY (see longitudinal plan of care for additional care plan information)  Current Barriers:  Chronic Disease Management support, education, and care coordination needs related to Diabetes, Hypertension, Hyperlipidemia, Nerve Pain, Anxiety, Glaucoma, GERD, Insomnia   Hypertension BP Readings from Last 3 Encounters:  07/04/21 120/60  06/02/21 114/78  05/23/21 122/70   Pharmacist Clinical Goal(s): Over the next 90 days, patient will work with PharmD and providers to maintain BP goal <140/90 Current regimen:  Lisinopril 5mg  daily in morning Patient self care activities - Over the next 90 days, patient will: Check blood pressure weekly, document, and provide at future appointments Ensure daily salt intake < 2300 mg/day  Hyperlipidemia Lab Results  Component Value Date   CHOL 109 07/04/2021   CHOL 113 02/03/2021   CHOL 110 09/06/2020   Lab Results  Component Value Date   HDL 43.70 07/04/2021   HDL 39.50 02/03/2021   HDL 38.20 (L) 09/06/2020   Lab Results  Component Value Date   LDLCALC 25 07/04/2021   [place] 46 02/03/2021   [place] 46 09/06/2020   Lab Results  Component Value Date   TRIG 197.0 (H) 07/04/2021   TRIG 139.0 02/03/2021   TRIG 128.0 09/06/2020   Pharmacist Clinical Goal(s): Over the next 90 days, patient will work with PharmD and  providers to maintain LDL goal < 100 and Triglycerides <150 Current regimen:  Rosuvastatin 40mg  daily (for LDL / 'bad' cholesterol) Fenofibrate 160mg  dialy (for triglycerides) Coenzyne Q10 200mg  daily Interventions: Reviewed goals for triglycerides and LDL. Maintain current cholesterol medication regimen.   Diabetes Lab Results  Component Value Date   HGBA1C 5.9 (A) 03/22/2021   Pharmacist Clinical Goal(s): Over the next 90 days, patient will work with PharmD and providers to maintain A1c goal <7% Current regimen:  Acarbose 50mg  three times a day with first bite of each meal Metformin XR 500 - take four tablets with morning meal Repaglinide 2mg  - take 2 tablets 30 minutes before each meal Bromocriptine 2.5mg  daily Rybelsus 7mg  - take daily 30 minutes before morning meal Patient self care activities - Over the next 90 days, patient will: Check blood sugar once daily, document, and provide at future appointments Contact provider with any episodes of hypoglycemia Increase exercise frequency to achieve goal of 150 minutes per week of moderate intensity exercise (like walking)  GERD Pharmacist Clinical Goal(s) Over the next 90 days, patient will work with PharmD and providers to reduce symptoms of GERD Current regimen:  Omeprazole 40mg  daily Patient self care activities - Over the next 90 days, patient will: Maintain current medication regimen acid relfux  Thyroid Health:   Lab Results  Component Value Date   TSH 3.66 02/03/2021  Pharmacist Clinical Goal(s) Over the next 90 days, patient will work with PharmD and providers to supplement thyroid hormone and improve sleep Current regimen:  Levotyroxine  25 mcg daily in morning - started 11/17/2020 Interventions: Patient advised to hold Biotin for 1 week prior to thyroid testing  Patient self care activities - Over the next 90 days, patient will: Do not take Biotin 1 week prior to thyroid testing Follow up with Dr Loanne Drilling as  planned.   Medication management Pharmacist Clinical Goal(s): Over the next 90 days, patient will work with PharmD and providers to maintain optimal medication adherence Current pharmacy: [facility] Interventions Comprehensive medication review performed. Continue current medication management strategy Patient self care activities - Over the next 90 days, patient will: Focus on medication adherence by filling medications appropriately  Take medications as prescribed Report any questions or concerns to PharmD and/or provider(s) Patient verbalizes understanding of instructions and care plan provided today and agrees to view in [place]. Active MyChart status confirmed with patient.

## 2021-07-11 NOTE — Telephone Encounter (Signed)
Application has been completed and forwarded to PCP for signature and review.

## 2021-07-11 NOTE — Chronic Care Management (AMB) (Signed)
Chronic Care Management Pharmacy Note  07/11/2021 Name:  Alyssa Ford MRN:  [id number] DOB:  [birth year]  Subjective: Alyssa Ford is an 71 y.o. year old female who is a primary patient of Ann Held, DO.  The CCM team was consulted for assistance with disease management and care coordination needs.    Collaboration with PCP and patient assistance program   for  completion and submission of patient assistance program for Rybelsus for 2023  in response to provider referral for pharmacy case management and/or care coordination services.   Consent to Services:  The patient was given information about Chronic Care Management services, agreed to services, and gave verbal consent prior to initiation of services.  Please see initial visit note for detailed documentation.   Patient Care Team: Carollee Herter, Alferd Apa, DO as PCP - General Calvert Cantor, MD as Consulting Physician (Ophthalmology) Levy Sjogren, MD as Referring Physician (Dermatology) Ilda Foil Sol Blazing, MD as Referring Physician (Pediatrics) Renato Shin, MD as Consulting Physician (Endocrinology) Cherre Robins, RPH-CPP (Pharmacist)  Recent office visits: 07/04/2021 - PCP (Dr Carollee Herter) Seen for pulmonary HTN and CAT scan follow up. No med changes noted.  06/10/2021 - Phone Call - Rybelsus PAP shipment arrived and patient notified.  06/02/2021 - Fam Med (Dr Carollee Herter) Seen for angioma of skin. No medication changes.  05/23/2021 - PCP (Dr Etter Sjogren)  seen for vaginal itching. Prescribed fluconazole 130m for 1 dose, may repeat in 3 days if needed. Urine dip negative. 05/13/2021 - PCP (Dr LEtter Sjogren  seen for insomnia and hospital follow up. Increased trazodone to 560mdaily.  Referred to neurology for sleep study. Also prescribed ciprofloxacin 5003mvery 12 hours for UTI.  02/03/2021 - PCP (Dr LowEtter Sjogren/U chronic conditions. No medication changes. BMP, TSH and lipids checked. ER Follow-up - chest  pain. Referred to cardio for workup of CP; Patient also mentioned BG elevated. Recommended take acarbose 100m70md for BG over 300. F/U with endo in 3 days (Monday).  Recent consult visits: 02/2021 - Endo (Dr ElliLoanne DrillingU diabetes and hypothyroidism. Decreased dose of repaglinide 2mg 64mm 3 times a day to twice a day 11/17/2020 - Endo (Dr EllisLoanne Drillinged levothyroxine 25mcg29mly; No change in meds for DM; A1c was 6.5% - much improved! 08/24/2020 - Endo (Dr EllisoLoanne Drillingu diabetes; Increased Rybelsus from 3mg da95m to 7mg dai72m 08/19/2020 - Cardio (Dr Nishan) Johnsie Cancell evaluation of CP. No med changes  Hospital visits: 04/30/2021 - ED Visit for hematuria. Prescribed cephalexin 500mg 4 t72m a day  Objective:  Lab Results  Component Value Date   CREATININE 0.80 07/04/2021   CREATININE 0.88 04/30/2021   CREATININE 0.81 02/03/2021    Lab Results  Component Value Date   HGBA1C 5.9 (A) 03/22/2021   Last diabetic Eye exam:  Lab Results  Component Value Date/Time   HMDIABEYEEXA Retinopathy (A) 06/17/2018 12:00 AM    Last diabetic Foot exam:  Lab Results  Component Value Date/Time   HMDIABFOOTEX normal 06/06/2010 12:00 AM        Component Value Date/Time   CHOL 109 07/04/2021 1324   TRIG 197.0 (H) 07/04/2021 1324   TRIG 216 (HH) 04/24/2006 0741   HDL 43.70 07/04/2021 1324   CHOLHDL 2 07/04/2021 1324   VLDL 39.4 07/04/2021 1324   LDLCALC 25 07/04/2021 1324   LDLDIRECT 87.0 06/07/2020 0838    Hepatic Function Latest Ref Rng & Units 07/04/2021 02/03/2021 09/06/2020  Total Protein 6.0 - 8.3 g/dL 7.1  7.3 7.1  Albumin 3.5 - 5.2 g/dL 4.7 4.5 4.5  AST 0 - 37 U/L _0 ALT 0 - 35 U/L 18 33 27  Alk Phosphatase 39 - 117 U/L 62 56 49  Total Bilirubin 0.2 - 1.2 mg/dL 0.4 0.4 0.4  Bilirubin, Direct 0.0 - 0.3 mg/dL - - -    Lab Results  Component Value Date/Time   TSH 3.66 02/03/2021 08:46 AM   TSH 6.51 (H) 08/24/2020 07:56 AM   FREET4 1.31 08/24/2020 07:56 AM   FREET4 1.28  04/22/2020 08:12 AM    CBC Latest Ref Rng & Units 04/30/2021 08/04/2020 01/06/2020  WBC 4.0 - 10.5 K/uL 12.2(H) 6.7 9.0  Hemoglobin 12.0 - 15.0 g/dL 12.5 12.5 12.9  Hematocrit 36.0 - 46.0 % 38.4 38.2 38.8  Platelets 150 - 400 K/uL 247 257 260.0    Lab Results  Component Value Date/Time   VD25OH 43 02/23/2010 09:05 PM   VD25OH 25 (L) 07/14/2009 09:49 PM    Clinical ASCVD: Yes  The ASCVD Risk score (Arnett DK, et al., 2019) failed to calculate for the following reasons:   The valid total cholesterol range is 130 to 320 mg/dL     Social History   Tobacco Use  Smoking Status Former   Packs/day: 1.40   Years: 49.00   Pack years: 68.60   Types: Cigarettes   Quit date: 07/16/2015   Years since quitting: 5.9  Smokeless Tobacco Never  Tobacco Comments   Continues to vape but states pod do not contain nicotine   BP Readings from Last 3 Encounters:  07/04/21 120/60  06/02/21 114/78  05/23/21 122/70   Pulse Readings from Last 3 Encounters:  07/04/21 79  06/02/21 73  05/23/21 78   Wt Readings from Last 3 Encounters:  07/04/21 149 lb 6.4 oz (67.8 kg)  06/02/21 148 lb 3.2 oz (67.2 kg)  05/23/21 148 lb 9.6 oz (67.4 kg)    Assessment: Review of patient past medical history, allergies, medications, health status, including review of consultants reports, laboratory and other test data, was performed as part of comprehensive evaluation and provision of chronic care management services.   SDOH:  (Social Determinants of Health) assessments and interventions performed:     CCM Care Plan  Allergies  Allergen Reactions   Codeine Nausea Only    Severe nausea    Medications Reviewed Today     Reviewed by Cherre Robins, RPH-CPP (Pharmacist) on 07/11/21 at 1008  Med List Status: <None>   Medication Order Taking? Sig Documenting Provider Last Dose Status Informant  acarbose (PRECOSE) 50 MG tablet [id number] Yes TAKE 1 TABLET(50 MG) BY MOUTH THREE TIMES DAILY WITH MEALS Renato Shin, MD Taking Active   aspirin EC 81 MG tablet [id number] Yes Take 81 mg by mouth daily. Ann Held, DO Taking Active Self  azelastine (ASTELIN) 0.1 % nasal spray [id number] Yes Place 1 spray into both nostrils 2 (two) times daily. Per ENT [provider] Taking Active   BIOTIN PO [id number] Yes Take 1 tablet by mouth daily.  [provider] Taking Active Self  bromocriptine (PARLODEL) 2.5 MG tablet [id number] Yes TAKE 1 TABLET(2.5 MG) BY MOUTH DAILY Renato Shin, MD Taking Active   Coenzyme Q10 (CO Q 10 PO) [id number] Yes Take 1 capsule by mouth daily. [provider] Taking Active   fenofibrate 160 MG tablet [id number] Yes TAKE 1 TABLET(160 MG) BY MOUTH DAILY Ann Held, DO Taking Active   Ferrous  Sulfate (IRON SUPPLEMENT PO) [id number] Yes Take 1 tablet by mouth daily. 94m [provider] Taking Active   latanoprost (XALATAN) 0.005 % ophthalmic solution [id number] Place 1 drop into both eyes at bedtime.  [provider] Taking Active            Med Note (Marikay AlarFeb 24, 2022  3:42 PM)    levothyroxine (SYNTHROID) 25 MCG tablet [id number] Take 1 tablet (25 mcg total) by mouth daily. ERenato Shin MD Taking Active   lisinopril (ZESTRIL) 5 MG tablet [id number] Take 1 tablet (5 mg total) by mouth daily. LCarollee Herter YKendrick FriesR, DO Taking Active   metFORMIN (GLUCOPHAGE-XR) 500 MG 24 hr tablet [id number] TAKE 4 TABLETS(2000 MG) BY MOUTH DAILY WITH BPaticia Stack MD Taking Active   Naproxen Sodium (ALEVE PO) [id number] Take 2 tablets by mouth daily.  [provider] Taking Active Self           Med Note (Dorothyann Peng MARTHA A   Tue Jan 18, 2021  9:00 AM) Taking as needed  omeprazole (PRILOSEC) 40 MG capsule [id number] TAKE 1 CAPSULE(40 MG) BY MOUTH DAILY LCarollee Herter YAlferd Apa DO Taking Active   repaglinide (PRANDIN) 2 MG tablet [id number] Take 1 tablet (2 mg total) by mouth 2 (two) times daily  before a meal. ERenato Shin MD Taking Active   rosuvastatin (CRESTOR) 40 MG tablet [id number] TAKE 1 TABLET(40 MG) BY MOUTH AT BEDTIME LCarollee Herter YKendrick FriesR, DO Taking Active   Semaglutide (RYBELSUS) 7 MG TABS [id number] Take 7 mg by mouth daily. ERenato Shin MD Taking Active            Med Note (Antony Contras TJenene SlickerJul 18, 2022 11:52 AM) Receiving from NovoNordisk PAP thru 05/2021            Patient Active Problem List   Diagnosis Date Noted   Pulmonary HTN ([place] 07/04/2021   Hyperlipidemia associated with type 2 diabetes mellitus (HE[place] 07/04/2021   Atherosclerosis of aorta ([place] 07/04/2021   Angioma of skin 06/02/2021   Vaginal itching 05/23/2021   Hematuria 05/23/2021   Insomnia 05/13/2021   Hypothyroidism 08/24/2020   Chest pain 08/06/2020   Trichimoniasis 04/27/2020   Cervical cancer screening 04/27/2020   Left thyroid nodule 04/13/2020   Vaginal odor 01/11/2020   Icterus 01/11/2020   Uncontrolled type 2 diabetes mellitus with hyperglycemia ([place] 09/22/2019   De Quervain's disease (tenosynovitis) 09/22/2019   Neck mass 07/11/2018   Abnormal WBC count 05/29/2017   Skin fissure 09/24/2015   Allergic rhinitis 03/26/2014   Wheezing 03/26/2014   DM (diabetes mellitus) type II uncontrolled, periph vascular disorder 08/25/2011   Headache(784.0) 08/21/2011   Acute sinusitis 04/26/2010   URI 04/26/2010   UNSPECIFIED CATARACT 02/23/2010   POSTMENOPAUSAL STATUS 12/12/2007   FATIGUE 09/11/2007   THYROID NODULE 11/22/2006   Diabetes ([place] 11/22/2006   Hyperlipidemia LDL goal <70 11/22/2006   GLUCOMA 11/22/2006   Essential hypertension 11/22/2006   GERD 11/22/2006    Immunization History  Administered Date(s) Administered   Fluad Quad(high Dose 65+) 04/08/2019, 04/08/2020   Influenza Split 04/13/2011   Influenza Whole 04/17/2006   Influenza, High Dose Seasonal PF 04/12/2016, 05/07/2017, 04/08/2018   Influenza, Seasonal, Injecte, Preservative Fre 05/22/2012    Influenza,inj,Quad PF,6+ Mos 03/03/2014, 03/18/2015   Influenza-Unspecified 05/07/2017, 05/02/2021   PFIZER(Purple Top)SARS-COV-2 Vaccination 07/21/2019, 08/11/2019, 04/27/2020, 11/23/2020   Pfizer Covid-19 Vaccine Bivalent Booster 147yr& up  05/03/2021   Pneumococcal Conjugate-13 09/13/2015, 05/07/2017, 04/15/2019   Pneumococcal Polysaccharide-23 03/31/2003, 11/03/2008, 05/07/2017   Td 10/24/1999, 02/23/2010   Tdap 02/21/2019   Zoster Recombinat (Shingrix) 03/09/2017, 06/23/2017   Zoster, Live 12/25/2011    Conditions to be addressed/monitored: CAD, HTN and DMII  Care Plan : General Pharmacy (Adult)  Updates made by Cherre Robins, RPH-CPP since 07/11/2021 12:00 AM     Problem: Medication management and chronic disease management, support, education and care coordination for diabetes, HTN, HDL, nerve pain, anxiety, glaucoma, insomnia and GERD   Priority: High  Onset Date: 11/10/2020  Note:   Current Barriers:  Unable to maintain control of type 2 DM (A1c improved and at goal as of 11/17/20) Difficulty sleeping not improved with current therapy Chronic Disease Management support, education, and care coordination needed in related to Diabetes, Hypertension, Hyperlipidemia, Nerve Pain, Anxiety, Glaucoma, GERD, Insomnia  Pharmacist Clinical Goal(s):  Over the next 90 days, patient will verbalize ability to afford treatment regimen achieve control of diabetes as evidenced by A1c < 7.0% maintain control of hyperlipidemia and HTN as evidenced by maintaining goals listed below  Improve sleep duration and quality  through collaboration with PharmD and provider.   Interventions: 1:1 collaboration with Carollee Herter, Alferd Apa, DO regarding development and update of comprehensive plan of care as evidenced by provider attestation and co-signature Inter-disciplinary care team collaboration (see longitudinal plan of care) Comprehensive medication review performed; medication list updated in  electronic medical record  Hypertension Controlled; BP goal <140/90 Current regimen:  Lisinopril 55m daily in morning Interventions: (addressed at previous visit) Check blood pressure weekly, document, and provide at future appointments Ensure daily salt intake < 2300 mg/day  Hyperlipidemia Controlled; LDL goal < 100 and Triglycerides <150 Current regimen:  Rosuvastatin 422mdaily (for LDL / 'bad' cholesterol) Fenofibrate 16057mialy (for triglycerides) Coenzyne Q10 200m63mily Interventions: (addressed at previous visit) Reviewed goals for triglycerides and LDL. Maintain current cholesterol medication regimen.   Diabetes As of 03/22/2021 A1c was 5.9%;  A1c goal <7%  Managed by Dr ElliLoanne Drillingext appt 07/28/2021 Recent home BG readings have been improving Checking every other day Ranging from 105 to 140 Denies hypoglycemia symptoms She does report that she has noticed decreased appetite since starting Rybelsus. Sometimes she feel fullness but knows that Rybelsus is working well.  Patient has been enrolled since June 2022 with [employer] assistance. Received first shipment around 01/05/2021. Noted to have received shipment 06/10/2021.  Current regimen:  Acarbose 50mg73mee times a day with first bite of each meal Metformin XR 500 - take four tablets with morning meal Repaglinide 2mg -81mke 2 tablets 30 minutes before each meal Bromocriptine 2.5mg da65m Rybelsus 7mg - t71m daily 30 minutes before morning meal Interventions;  Reviewed appropriate timing of medication administration to get best blood glucose lowering effects Recommended to check blood sugar daily, document, and provide at future appointments Received patient assistance program application for Rybelsus. Completed provider portion and forwarded to Dr Lowne-ChCarollee Herterew and sign. Will fax to patient assistance program when returned.  If A1c continues to be <7.0 - might be able to consider lowering dose /  or stopping acarbose to see if this helps with fullness / decreased appetite  - though I did explain to patient that she is how Rybelsus works to improve blood glucose.  Continue to walk daily to achieve goal of 150 minutes per week of moderate intensity exercise (like walking)  GERD (goal -  reduce  symptoms of GERD) Controlled Denies breakthrough GERD symptoms except when she occasionally forgets to take omeprazole Current regimen:  Omeprazole 89m daily Interventions: (addressed at previous visit) Maintain current medication regimen for acid relfux  Thyroid Health:  Lab Results  Component Value Date   TSH 3.66 02/03/2021  Dr ELoanne Drillingstarted thyroid supplementation 11/17/20 Current regimen:  Levothyroxine 263m daily - started 11/17/2020 Interventions: Patient advised to hold Biotin at least 1 week prior to any thyroid testing Follow up with Dr ElLoanne Drillings planned.   Medication management Current pharmacy: [facility] Interventions Comprehensive medication review performed. Continue current medication management strategy Reviewed medication refill history to assess adherence  Patient Goals/Self-Care Activities Over the next 90 days, patient will:  take medications as prescribed, check glucose daily, document, and provide at future appointments, and collaborate with provider on medication access solutions  Follow Up Plan: Telephone follow up appointment with care management team member scheduled for:  1 month to check on patient assistance          Medication Assistance: Rybelsus patient assistance program application for [id number] been returned to office. Completed provider portion and forwarded to PCP for review and signature.   [id number]: [phone]: [phone]  Follow Up:  Patient agrees  to Care Plan and Follow-up.  Plan: The care management team will reach out to the patient again over the next 30 days.   TaCherre RobinsPharmD Clinical Pharmacist [phone]

## 2021-07-21 NOTE — Telephone Encounter (Signed)
Application was faxed to [employer] patient assistance program 07/13/2021. Checked to see if application has been reviewed. No decision yet per automated system. Only registering last years application. Refaxed application 92/06/69.

## 2021-07-26 DIAGNOSIS — E1165 Type 2 diabetes mellitus with hyperglycemia: Secondary | ICD-10-CM | POA: Diagnosis not present

## 2021-07-26 DIAGNOSIS — I700 Atherosclerosis of aorta: Secondary | ICD-10-CM

## 2021-07-27 ENCOUNTER — Ambulatory Visit: Payer: Medicare Other | Admitting: Pulmonary Disease

## 2021-07-27 ENCOUNTER — Other Ambulatory Visit: Payer: Self-pay

## 2021-07-27 ENCOUNTER — Encounter: Payer: Self-pay | Admitting: Pulmonary Disease

## 2021-07-27 VITALS — BP 122/64 | HR 72 | Temp 97.8°F | Ht 66.0 in | Wt 149.4 lb

## 2021-07-27 DIAGNOSIS — I288 Other diseases of pulmonary vessels: Secondary | ICD-10-CM

## 2021-07-27 DIAGNOSIS — R0609 Other forms of dyspnea: Secondary | ICD-10-CM | POA: Diagnosis not present

## 2021-07-27 NOTE — Patient Instructions (Addendum)
Nice to meet you  While your shortness of breath seems relatively mild, I think additional investigation is warranted.  Over the heart ultrasound to evaluate some other concern on the CT scan regarding pulmonary hypertension.  I am not overly concerned about  this, the findings on CT scan tend to overestimate things but I think it is worth a check just in case.  In addition, I think he should get pulmonary function test to evaluate if there are other reasons for your shortness of breath, possible that cigarette smoking is eventually called up to you.  Lastly, given what sounds like a pretty prolonged course of bronchitis last year, sometimes people develop mild asthma later in life.  Based on the pulmonary function test results, we may consider using an inhaler in the future.  Pulmonary function test next available  Return to clinic in 3 weeks or sooner as needed with Dr. Silas Flood

## 2021-07-28 ENCOUNTER — Ambulatory Visit (INDEPENDENT_AMBULATORY_CARE_PROVIDER_SITE_OTHER): Payer: Medicare Other | Admitting: Endocrinology

## 2021-07-28 VITALS — BP 130/72 | HR 72 | Ht 66.0 in | Wt 148.8 lb

## 2021-07-28 DIAGNOSIS — E1142 Type 2 diabetes mellitus with diabetic polyneuropathy: Secondary | ICD-10-CM | POA: Diagnosis not present

## 2021-07-28 LAB — POCT GLYCOSYLATED HEMOGLOBIN (HGB A1C): Hemoglobin A1C: 6.2 % — AB (ref 4.0–5.6)

## 2021-07-28 MED ORDER — REPAGLINIDE 1 MG PO TABS: 1.0000 mg | ORAL_TABLET | Freq: Two times a day (BID) | ORAL | 3 refills | Status: DC

## 2021-07-28 NOTE — Progress Notes (Signed)
Subjective:    Patient ID: Alyssa Ford, female    DOB: 1951/06/24, 71 y.o.   MRN: [id number]  HPI Pt returns for f/u of diabetes mellitus:  DM type: 2 Dx'ed: 4196 Complications: PN and CAD (seen on CT).   Therapy: 5 oral meds GDM: never DKA: never Severe hypoglycemia: never Pancreatitis: never Pancreatic imaging: never Other: she has never been on insulin.  SDOH: she wants generic meds, if possible.   Interval history: Pt states cbg's are well-controlled.  She takes meds as rx'ed.  pt states she feels well in general, except for constip and bloating.  She has mild hypoglycemia before lunch.   Pt also has MNG ([place] in 2020 showed LLP nodule; CT 9/20 and [place] 2021 were unchanged; pt say ENT in [place] is doing f/u US).   She takes synthroid as rx'ed.   Past Medical History:  Diagnosis Date   Anemia    Anxiety    Arthritis    Cataract    Chronic headaches    Depression    Diabetes mellitus    Diverticulosis of colon (without mention of hemorrhage) 2003, 2008   Colonoscopy   GERD (gastroesophageal reflux disease)    Glaucoma    Hiatal hernia 2003   EGD    Hyperlipidemia    Hypertension    Thyroid nodule     Past Surgical History:  Procedure Laterality Date   CARPAL TUNNEL RELEASE  2006   CARPAL TUNNEL RELEASE  12/24/2007   CATARACT EXTRACTION, BILATERAL     [place]---  sept and oct 2018   CERVICAL FUSION  3/08   CHOLECYSTECTOMY     FASCIECTOMY Left 08/01/2018   Procedure: FASCIECTOMY LEFT RING;  Surgeon: Daryll Brod, MD;  Location: [facility];  Service: Orthopedics;  Laterality: Left;  UPPER ARM   TRIGGER FINGER RELEASE Left 08/01/2018   Procedure: RELEASE TRIGGER FINGER/A-1 PULLEY [place];  Surgeon: Daryll Brod, MD;  Location: [facility];  Service: Orthopedics;  Laterality: Left;    Social History   Socioeconomic History   Marital status: Married    Spouse name: Not on file   Number of children: Not on file   Years of education: Not on  file   Highest education level: Not on file  Occupational History   Occupation: retired  Tobacco Use   Smoking status: Former    Packs/day: 1.40    Years: 49.00    Pack years: 68.60    Types: Cigarettes    Quit date: 07/16/2015    Years since quitting: 6.0   Smokeless tobacco: Never   Tobacco comments:    Continues to vape but states pod do not contain nicotine  Vaping Use   Vaping Use: Every day  Substance and Sexual Activity   Alcohol use: Yes    Alcohol/week: 0.0 standard drinks    Comment: once weekly; wine and beer   Drug use: No   Sexual activity: Not Currently    Partners: Male  Other Topics Concern   Not on file  Social History Narrative   Exercise--  walk   Social Determinants of Health   Financial Resource Strain: Low Risk    Difficulty of Paying Living Expenses: Not very hard  Food Insecurity: No Food Insecurity   Worried About Charity fundraiser in the Last Year: Never true   Ran Out of Food in the Last Year: Never true  Transportation Needs: No Transportation Needs   Lack of Transportation (Medical):  No   Lack of Transportation (Non-Medical): No  Physical Activity: Insufficiently Active   Days of Exercise per Week: 4 days   Minutes of Exercise per Session: 20 min  Stress: No Stress Concern Present   Feeling of Stress : Only a little  Social Connections: Moderately Isolated   Frequency of Communication with Friends and Family: More than three times a week   Frequency of Social Gatherings with Friends and Family: More than three times a week   Attends Religious Services: Never   Marine scientist or Organizations: No   Attends Music therapist: Never   Marital Status: Married  Human resources officer Violence: Not At Risk   Fear of Current or Ex-Partner: No   Emotionally Abused: No   Physically Abused: No   Sexually Abused: No    Current Outpatient Medications on File Prior to Visit  Medication Sig Dispense Refill   acarbose (PRECOSE)  50 MG tablet TAKE 1 TABLET(50 MG) BY MOUTH THREE TIMES DAILY WITH MEALS 270 tablet 3   aspirin EC 81 MG tablet Take 81 mg by mouth daily.     BIOTIN PO Take 1 tablet by mouth daily.      bromocriptine (PARLODEL) 2.5 MG tablet TAKE 1 TABLET(2.5 MG) BY MOUTH DAILY 90 tablet 3   Coenzyme Q10 (CO Q 10 PO) Take 1 capsule by mouth daily.     fenofibrate 160 MG tablet TAKE 1 TABLET(160 MG) BY MOUTH DAILY 90 tablet 1   Ferrous Sulfate (IRON SUPPLEMENT PO) Take 1 tablet by mouth daily. 65mg      latanoprost (XALATAN) 0.005 % ophthalmic solution Place 1 drop into both eyes at bedtime.      levothyroxine (SYNTHROID) 25 MCG tablet Take 1 tablet (25 mcg total) by mouth daily. 90 tablet 3   lisinopril (ZESTRIL) 5 MG tablet Take 1 tablet (5 mg total) by mouth daily. 90 tablet 1   metFORMIN (GLUCOPHAGE-XR) 500 MG 24 hr tablet TAKE 4 TABLETS(2000 MG) BY MOUTH DAILY WITH BREAKFAST 360 tablet 0   Naproxen Sodium (ALEVE PO) Take 2 tablets by mouth daily.      omeprazole (PRILOSEC) 40 MG capsule TAKE 1 CAPSULE(40 MG) BY MOUTH DAILY 90 capsule 1   rosuvastatin (CRESTOR) 40 MG tablet TAKE 1 TABLET(40 MG) BY MOUTH AT BEDTIME 90 tablet 1   Semaglutide (RYBELSUS) 7 MG TABS Take 7 mg by mouth daily. 90 tablet 3   No current facility-administered medications on file prior to visit.    Allergies  Allergen Reactions   Codeine Nausea Only    Severe nausea    Family History  Problem Relation Age of Onset   Dementia Mother    Breast cancer Mother 55   Kidney failure Mother 55       died from kidney failure from abx given to her for pneumonia   Hyperlipidemia Mother    Hypertension Mother    Diabetes Mother    Cancer Mother 61       breast   Parkinsonism Father    Diabetes Father 12   Hypertension Sister    Diabetes Brother    Hypertension Brother    Heart attack Brother    Heart disease Cousin 60       mi   Colon cancer Neg Hx    Esophageal cancer Neg Hx    Stomach cancer Neg Hx     BP 130/72    Pulse  72    Ht 5\' 6"  (1.676 m)  Wt 148 lb 12.8 oz (67.5 kg)    SpO2 97%    BMI 24.02 kg/m    Review of Systems She has lost a few lbs.      Objective:   Physical Exam VITAL SIGNS:  See vs page GENERAL: no distress Pulses: dorsalis pedis intact bilat.   MSK: no deformity of the feet CV: no leg edema Skin:  no ulcer on the feet.  normal color and temp on the feet. Neuro: sensation is intact to touch on the feet.      Lab Results  Component Value Date   TSH 3.66 02/03/2021   Lab Results  Component Value Date   CREATININE 0.80 07/04/2021   BUN 14 07/04/2021   NA 133 (L) 07/04/2021   K 3.9 07/04/2021   CL 98 07/04/2021   CO2 24 07/04/2021   A1c=6.2%    Assessment & Plan:  Type 2 DM: overcontrolled Hypoglycemia, due to repaglinide.  Patient Instructions  I have sent a prescription to your pharmacy, to reduce the repaglinide again.   Please continue the same other 4 diabetes medications.   check your blood sugar once a day.  vary the time of day when you check, between before the 3 meals, and at bedtime.  also check if you have symptoms of your blood sugar being too high or too low.  please keep a record of the readings and bring it to your next appointment here (or you can bring the meter itself).  You can write it on any piece of paper.  please call us sooner if your blood sugar goes below 70, or if you have a lot of readings over 200.   Please come back for a follow-up appointment in 6 months.

## 2021-07-28 NOTE — Patient Instructions (Addendum)
I have sent a prescription to your pharmacy, to reduce the repaglinide again.   Please continue the same other 4 diabetes medications.   check your blood sugar once a day.  vary the time of day when you check, between before the 3 meals, and at bedtime.  also check if you have symptoms of your blood sugar being too high or too low.  please keep a record of the readings and bring it to your next appointment here (or you can bring the meter itself).  You can write it on any piece of paper.  please call us sooner if your blood sugar goes below 70, or if you have a lot of readings over 200.   Please come back for a follow-up appointment in 6 months.

## 2021-07-31 NOTE — Progress Notes (Signed)
_0  ID: Alyssa Ford, female    DOB: January 06, 1951, 71 y.o.   MRN: [id number]  Chief Complaint  Patient presents with   Consult    Consult for pulmonary HTN. Pt states when she walks up hill she gets short of breath. She states that this has been an issues for a few months.     Referring provider: Ann Held, *  HPI:   71 y.o. woman whom we are seeing in consultation for evaluation of possible pulmonary hypertension.  Note from referring provider reviewed.  Patient referred after CT chest lung cancer screening 06/08/2021 reviewed and large pulmonary artery in the report.  PCP made referral.  She denies significant dyspnea.  She has mild dyspnea.  Worse on inclines or stairs.  No times a day with things are better or worse.  The position makes his better or worse.  No seasonal environmental factors he identified makes his better or worse.  Present for some time, months.  Maybe longer.  However to say given how mild the symptoms are.  No other alleviating or exacerbating factors.  Reviewed most recent chest imaging chest x-ray 08/04/2020 that on my review and interpretation reveals chronic bilaterally.  Most recent CT chest lung cancer screening 06/08/2021 on my review and interpretation shows tiny less than 4 mm nodules, lung RADS 2 by report, mild emphysema most notable in the upper lobes.  No history of echocardiogram.  PMH: Hypertension, hypothyroid, diabetes Surgical history: Carpal tunnel surgery, cervical fusion of the neck, cholecystectomy Family history: Mother with history of breast cancer, kidney failure, hypertension, diabetes, father with diabetes Social history: Former smoker quit 2017, approxiseventy pack-year history, lives in [place] / Pulmonary Flowsheets:   ACT:  No flowsheet data found.  MMRC: No flowsheet data found.  Epworth:  No flowsheet data found.  Tests:   FENO:  No results found for: NITRICOXIDE  PFT: No flowsheet  data found.  WALK:  No flowsheet data found.  Imaging: Personally reviewed and as per EMR discussion in this note  Lab Results: Personally reviewed CBC    Component Value Date/Time   WBC 12.2 (H) 04/30/2021 1852   RBC 4.31 04/30/2021 1852   HGB 12.5 04/30/2021 1852   HGB 12.7 06/20/2017 1057   HCT 38.4 04/30/2021 1852   HCT 37.5 06/20/2017 1057   PLT 247 04/30/2021 1852   PLT 273 06/20/2017 1057   MCV 89.1 04/30/2021 1852   MCV 85 06/20/2017 1057   MCH 29.0 04/30/2021 1852   MCHC 32.6 04/30/2021 1852   RDW 13.5 04/30/2021 1852   RDW 14.8 06/20/2017 1057   LYMPHSABS 3.3 04/30/2021 1852   LYMPHSABS 3.2 06/20/2017 1057   MONOABS 0.7 04/30/2021 1852   EOSABS 0.2 04/30/2021 1852   EOSABS 0.2 06/20/2017 1057   BASOSABS 0.1 04/30/2021 1852   BASOSABS 0.1 06/20/2017 1057    BMET    Component Value Date/Time   NA 133 (L) 07/04/2021 1324   K 3.9 07/04/2021 1324   CL 98 07/04/2021 1324   CO2 24 07/04/2021 1324   GLUCOSE 85 07/04/2021 1324   GLUCOSE 143 (H) 04/24/2006 0741   BUN 14 07/04/2021 1324   CREATININE 0.80 07/04/2021 1324   CALCIUM 10.3 07/04/2021 1324   GFRNONAA >60 04/30/2021 1852   GFRAA  12/23/2007 1355    >60        The eGFR has been calculated using the MDRD equation. This calculation has not been validated in all clinical  BNP No results found for: BNP  ProBNP No results found for: PROBNP  Specialty Problems       Pulmonary Problems   Acute sinusitis    Qualifier: Diagnosis of  By: Jerold Coombe        URI    Qualifier: Diagnosis of  By: Jerold Coombe        Allergic rhinitis   Wheezing    Allergies  Allergen Reactions   Codeine Nausea Only    Severe nausea    Immunization History  Administered Date(s) Administered   Fluad Quad(high Dose 65+) 04/08/2019, 04/08/2020   Influenza Split 04/13/2011   Influenza Whole 04/17/2006   Influenza, High Dose Seasonal PF 04/12/2016, 05/07/2017, 04/08/2018   Influenza,  Seasonal, Injecte, Preservative Fre 05/22/2012   Influenza,inj,Quad PF,6+ Mos 03/03/2014, 03/18/2015   Influenza-Unspecified 05/07/2017, 05/02/2021   PFIZER(Purple Top)SARS-COV-2 Vaccination 07/21/2019, 08/11/2019, 04/27/2020, 11/23/2020   Pfizer Covid-19 Vaccine Bivalent Booster 71yr & up 05/03/2021   Pneumococcal Conjugate-13 09/13/2015, 05/07/2017, 04/15/2019   Pneumococcal Polysaccharide-23 03/31/2003, 11/03/2008, 05/07/2017   Td 10/24/1999, 02/23/2010   Tdap 02/21/2019   Zoster Recombinat (Shingrix) 03/09/2017, 06/23/2017   Zoster, Live 12/25/2011    Past Medical History:  Diagnosis Date   Anemia    Anxiety    Arthritis    Cataract    Chronic headaches    Depression    Diabetes mellitus    Diverticulosis of colon (without mention of hemorrhage) 2003, 2008   Colonoscopy   GERD (gastroesophageal reflux disease)    Glaucoma    Hiatal hernia 2003   EGD    Hyperlipidemia    Hypertension    Thyroid nodule     Tobacco History: Social History   Tobacco Use  Smoking Status Former   Packs/day: 1.40   Years: 49.00   Pack years: 68.60   Types: Cigarettes   Quit date: 07/16/2015   Years since quitting: 6.0  Smokeless Tobacco Never  Tobacco Comments   Continues to vape but states pod do not contain nicotine   Counseling given: Not Answered Tobacco comments: Continues to vape but states pod do not contain nicotine   Continue to not smoke  Outpatient Encounter Medications as of 07/27/2021  Medication Sig   acarbose (PRECOSE) 50 MG tablet TAKE 1 TABLET(50 MG) BY MOUTH THREE TIMES DAILY WITH MEALS   aspirin EC 81 MG tablet Take 81 mg by mouth daily.   BIOTIN PO Take 1 tablet by mouth daily.    bromocriptine (PARLODEL) 2.5 MG tablet TAKE 1 TABLET(2.5 MG) BY MOUTH DAILY   Coenzyme Q10 (CO Q 10 PO) Take 1 capsule by mouth daily.   fenofibrate 160 MG tablet TAKE 1 TABLET(160 MG) BY MOUTH DAILY   Ferrous Sulfate (IRON SUPPLEMENT PO) Take 1 tablet by mouth daily. 641m   latanoprost (XALATAN) 0.005 % ophthalmic solution Place 1 drop into both eyes at bedtime.    levothyroxine (SYNTHROID) 25 MCG tablet Take 1 tablet (25 mcg total) by mouth daily.   lisinopril (ZESTRIL) 5 MG tablet Take 1 tablet (5 mg total) by mouth daily.   metFORMIN (GLUCOPHAGE-XR) 500 MG 24 hr tablet TAKE 4 TABLETS(2000 MG) BY MOUTH DAILY WITH BREAKFAST   Naproxen Sodium (ALEVE PO) Take 2 tablets by mouth daily.    omeprazole (PRILOSEC) 40 MG capsule TAKE 1 CAPSULE(40 MG) BY MOUTH DAILY   rosuvastatin (CRESTOR) 40 MG tablet TAKE 1 TABLET(40 MG) BY MOUTH AT BEDTIME   Semaglutide (RYBELSUS) 7 MG TABS Take 7 mg by  mouth daily.   [DISCONTINUED] repaglinide (PRANDIN) 2 MG tablet Take 1 tablet (2 mg total) by mouth 2 (two) times daily before a meal.   [DISCONTINUED] azelastine (ASTELIN) 0.1 % nasal spray Place 1 spray into both nostrils 2 (two) times daily. Per ENT   No facility-administered encounter medications on file as of 07/27/2021.     Review of Systems  Review of Systems  No chest pain with exertion.  No orthopnea or PND.  No lower extremity swelling.  Comprehensive review of systems otherwise negative. Physical Exam  BP 122/64 (BP Location: Left Arm, Patient Position: Sitting, Cuff Size: Normal)    Pulse 72    Temp 97.8 F (36.6 C) (Oral)    Ht _0  (1.676 m)    Wt 149 lb 6.4 oz (67.8 kg)    SpO2 97%    BMI 24.11 kg/m   Wt Readings from Last 5 Encounters:  07/28/21 148 lb 12.8 oz (67.5 kg)  07/27/21 149 lb 6.4 oz (67.8 kg)  07/04/21 149 lb 6.4 oz (67.8 kg)  06/02/21 148 lb 3.2 oz (67.2 kg)  05/23/21 148 lb 9.6 oz (67.4 kg)    BMI Readings from Last 5 Encounters:  07/28/21 24.02 kg/m  07/27/21 24.11 kg/m  07/04/21 24.86 kg/m  06/02/21 23.92 kg/m  05/23/21 23.98 kg/m     Physical Exam General: Well-appearing, no acute distress Eyes: EOMI, icterus Neck: Supple, no JVP Pulmonary: Clear, normal work of breathing Cardiovascular: warm, no edema Abdomen: Nondistended,  bowel sounds present MSK: No synovitis, no joint effusion Neuro: Normal gait, no weakness Psych: Normal mood, full affect   Assessment & Plan:   Enlarged pulmonary artery on cross-sectional imaging: While can be a marker for pulm hypertension, is not a reliable marker.  We will screen with echocardiogram for pulmonary hypertension.  Dyspnea on exertion: Mild.  History of tobacco abuse.  PFTs for further evaluation.  TTE as above.  History of tobacco abuse: Currently undergoing lung cancer screening, encouraged to continue this.   Return in about 3 months (around 10/24/2021).   Lanier Clam, MD 07/31/2021

## 2021-08-01 ENCOUNTER — Ambulatory Visit: Payer: Medicare Other | Admitting: Neurology

## 2021-08-01 ENCOUNTER — Encounter: Payer: Self-pay | Admitting: Neurology

## 2021-08-01 VITALS — BP 116/62 | HR 79 | Ht 66.0 in | Wt 149.4 lb

## 2021-08-01 DIAGNOSIS — R351 Nocturia: Secondary | ICD-10-CM

## 2021-08-01 DIAGNOSIS — R0683 Snoring: Secondary | ICD-10-CM | POA: Diagnosis not present

## 2021-08-01 DIAGNOSIS — G4700 Insomnia, unspecified: Secondary | ICD-10-CM

## 2021-08-01 DIAGNOSIS — R5383 Other fatigue: Secondary | ICD-10-CM

## 2021-08-01 NOTE — Progress Notes (Signed)
Subjective:    Patient ID: Alyssa Ford is a 71 y.o. female.  HPI    Star Age, MD, PhD [facility] [address], [address] [place], [place] [address]  Dear Dr. Carollee Herter,   I saw your patient, Alyssa Ford, upon your kind request, in my sleep clinic today for initial consultation of her Sleep disorder, in particular, difficulty maintaining sleep.  The patient is unaccompanied today.  As you know, [name] is a 71 year old right-handed woman with an underlying medical history of diabetes, reflux disease, hypertension, hyperlipidemia, thyroid nodule, anemia, arthritis, anxiety, and depression, who reports snoring and difficulty maintaining sleep for years.  She has been on amitriptyline at night for many years, probably close to 30 years as per her recollection.  She originally started this for jaw pain and fibromyalgia symptoms.  She tried trazodone to help her sleep and amitriptyline but it did not help and she restarted the amitriptyline.  She snores according to her husband.  They do not typically sleep in the same bedroom because of his sleep apnea and his CPAP.  She would be willing to try CPAP therapy.  She goes to bed typically around 9:30 PM and 10 and while she does not have major difficulty falling asleep she does not stay asleep and often wakes up after 4 to 5 hours.  She has trouble going back to sleep and may be awake for 1 to 3 hours.  Rise time is around 7 AM to maybe 8 AM on most days except Fridays when she has to get up at 5 AM to pick up her great granddaughter.  She has 3 grown children, 2 grandchildren and 1 great grandchild.  She lives with her husband.  She quit smoking but does vape nicotine.  She drinks caffeine in the form of coffee, 3 to 4 cups throughout the day until about 3 PM and sometimes another half caff coffee.  She drinks alcohol occasionally, maybe once a week.  I reviewed your office note from 05/13/2021.  Her Epworth  sleepiness score is 5 out of 24, fatigue severity score is 40 out of 63.  She does not have a family history of sleep apnea.  She has nocturia about once or twice per average night and denies recurrent morning headaches, symptoms of leg twitching at night.  In fact she hardly moves at night when she is asleep her husband's feedback.  She has lost a little bit of weight since starting Rybelsus.  Her Past Medical History Is Significant For: Past Medical History:  Diagnosis Date   Anemia    Anxiety    Arthritis    Cataract    Chronic headaches    Depression    Diabetes mellitus    Diverticulosis of colon (without mention of hemorrhage) 2003, 2008   Colonoscopy   GERD (gastroesophageal reflux disease)    Glaucoma    Hiatal hernia 2003   EGD    Hyperlipidemia    Hypertension    Thyroid nodule     Her Past Surgical History Is Significant For: Past Surgical History:  Procedure Laterality Date   CARPAL TUNNEL RELEASE  2006   CARPAL TUNNEL RELEASE  12/24/2007   CATARACT EXTRACTION, BILATERAL     [facility]---  sept and oct 2018   CERVICAL FUSION  3/08   CHOLECYSTECTOMY     FASCIECTOMY Left 08/01/2018   Procedure: FASCIECTOMY LEFT RING;  Surgeon: Daryll Brod, MD;  Location: [facility];  Service: Orthopedics;  Laterality: Left;  UPPER ARM   TRIGGER FINGER RELEASE Left 08/01/2018   Procedure: RELEASE TRIGGER FINGER/A-1 PULLEY [place];  Surgeon: Daryll Brod, MD;  Location: [facility];  Service: Orthopedics;  Laterality: Left;    Her Family History Is Significant For: Family History  Problem Relation Age of Onset   Dementia Mother    Breast cancer Mother 66   Kidney failure Mother 61       died from kidney failure from abx given to her for pneumonia   Hyperlipidemia Mother    Hypertension Mother    Diabetes Mother    Cancer Mother 41       breast   Parkinsonism Father    Diabetes Father 64   Hypertension Sister    Diabetes Brother    Hypertension Brother     Heart attack Brother    Heart disease Cousin 93       mi   Colon cancer Neg Hx    Esophageal cancer Neg Hx    Stomach cancer Neg Hx     Her Social History Is Significant For: Social History   Socioeconomic History   Marital status: Married    Spouse name: Not on file   Number of children: Not on file   Years of education: Not on file   Highest education level: Not on file  Occupational History   Occupation: retired  Tobacco Use   Smoking status: Former    Packs/day: 1.40    Years: 49.00    Pack years: 68.60    Types: Cigarettes    Quit date: 07/16/2015    Years since quitting: 6.0   Smokeless tobacco: Never   Tobacco comments:    Continues to vape but states pod do not contain nicotine  Vaping Use   Vaping Use: Every day  Substance and Sexual Activity   Alcohol use: Yes    Alcohol/week: 0.0 standard drinks    Comment: once weekly; wine and beer   Drug use: No   Sexual activity: Not Currently    Partners: Male  Other Topics Concern   Not on file  Social History Narrative   Exercise--  walk   Social Determinants of Health   Financial Resource Strain: Low Risk    Difficulty of Paying Living Expenses: Not very hard  Food Insecurity: No Food Insecurity   Worried About Charity fundraiser in the Last Year: Never true   Ran Out of Food in the Last Year: Never true  Transportation Needs: No Transportation Needs   Lack of Transportation (Medical): No   Lack of Transportation (Non-Medical): No  Physical Activity: Insufficiently Active   Days of Exercise per Week: 4 days   Minutes of Exercise per Session: 20 min  Stress: No Stress Concern Present   Feeling of Stress : Only a little  Social Connections: Moderately Isolated   Frequency of Communication with Friends and Family: More than three times a week   Frequency of Social Gatherings with Friends and Family: More than three times a week   Attends Religious Services: Never   Marine scientist or  Organizations: No   Attends Archivist Meetings: Never   Marital Status: Married    Her Allergies Are:  Allergies  Allergen Reactions   Codeine Nausea Only    Severe nausea  :   Her Current Medications Are:  Outpatient Encounter Medications as of 08/01/2021  Medication Sig   acarbose (PRECOSE)  50 MG tablet TAKE 1 TABLET(50 MG) BY MOUTH THREE TIMES DAILY WITH MEALS   amitriptyline (ELAVIL) 25 MG tablet Take by mouth at bedtime.   aspirin EC 81 MG tablet Take 81 mg by mouth daily.   BIOTIN PO Take 1 tablet by mouth daily.    bromocriptine (PARLODEL) 2.5 MG tablet TAKE 1 TABLET(2.5 MG) BY MOUTH DAILY   Coenzyme Q10 (CO Q 10 PO) Take 1 capsule by mouth daily.   fenofibrate 160 MG tablet TAKE 1 TABLET(160 MG) BY MOUTH DAILY   Ferrous Sulfate (IRON SUPPLEMENT PO) Take 1 tablet by mouth daily. 65mg    ipratropium (ATROVENT) 0.06 % nasal spray Place 1 spray into both nostrils 2 (two) times daily.   latanoprost (XALATAN) 0.005 % ophthalmic solution Place 1 drop into both eyes at bedtime.    levothyroxine (SYNTHROID) 25 MCG tablet Take 1 tablet (25 mcg total) by mouth daily.   lisinopril (ZESTRIL) 5 MG tablet Take 1 tablet (5 mg total) by mouth daily.   metFORMIN (GLUCOPHAGE-XR) 500 MG 24 hr tablet TAKE 4 TABLETS(2000 MG) BY MOUTH DAILY WITH BREAKFAST   Naproxen Sodium (ALEVE PO) Take 2 tablets by mouth daily.    omeprazole (PRILOSEC) 40 MG capsule TAKE 1 CAPSULE(40 MG) BY MOUTH DAILY   repaglinide (PRANDIN) 1 MG tablet Take 1 tablet (1 mg total) by mouth 2 (two) times daily before a meal.   rosuvastatin (CRESTOR) 40 MG tablet TAKE 1 TABLET(40 MG) BY MOUTH AT BEDTIME   Semaglutide (RYBELSUS) 7 MG TABS Take 7 mg by mouth daily.   No facility-administered encounter medications on file as of 08/01/2021.  :   Review of Systems:  Out of a complete 14 point review of systems, all are reviewed and negative with the exception of these symptoms as listed below:   Review of Systems   Neurological:        Most of time, goes to sleep then on waking for BR then will not be albe to go back to sleep. 4-6 hours avgs a night. Fatigue. Lays down in afternoon, naps very rare.  Snores.  ESS 5, FSS 40. Tried trazadone, now on amitriptyline.   Objective:  Neurological Exam  Physical Exam Physical Examination:   Vitals:   08/01/21 1100  BP: 116/62  Pulse: 79    General Examination: The patient is a very pleasant 71 y.o. female in no acute distress. She appears well-developed and well-nourished and well groomed.   HEENT: Normocephalic, atraumatic, pupils are equal, round and reactive to light, extraocular tracking is good without limitation to gaze excursion or nystagmus noted. Hearing is grossly intact. Face is symmetric with normal facial animation. Speech is clear with no dysarthria noted. There is no hypophonia. There is no lip, neck/head, jaw or voice tremor. Neck is supple with full range of passive and active motion. There are no carotid bruits on auscultation. Oropharynx exam reveals: moderate mouth dryness, adequate dental hygiene and mild airway crowding, due to 3 soft palate.  Tonsils on the small side.  Mallampati class II.  Neck circumference of 14 and three-quarter inches.  Minimal overbite.  Tongue protrudes centrally and palate elevates symmetrically.  Chest: Clear to auscultation without wheezing, rhonchi or crackles noted.  Heart: S1+S2+0, regular and normal without murmurs, rubs or gallops noted.   Abdomen: Soft, non-tender and non-distended.  Extremities: There is no pitting edema in the distal lower extremities bilaterally.   Skin: Warm and dry without trophic changes noted.   Musculoskeletal: exam reveals no  obvious joint deformities.   Neurologically:  Mental status: The patient is awake, alert and oriented in all 4 spheres. Her immediate and remote memory, attention, language skills and fund of knowledge are appropriate. There is no evidence of  aphasia, agnosia, apraxia or anomia. Speech is clear with normal prosody and enunciation. Thought process is linear. Mood is normal and affect is normal.  Cranial nerves II - XII are as described above under HEENT exam.  Motor exam: Normal bulk, strength and tone is noted. There is no tremor. Fine motor skills and coordination: grossly intact.  Cerebellar testing: No dysmetria or intention tremor. There is no truncal or gait ataxia.  Sensory exam: intact to light touch in the upper and lower extremities.  Gait, station and balance: She stands easily. No veering to one side is noted. No leaning to one side is noted. Posture is age-appropriate and stance is narrow based. Gait shows normal stride length and normal pace. No problems turning are noted.   Assessment and Plan:  In summary, Tais H Ute[place] is a very pleasant 71 y.o.-year old female with an underlying medical history of diabetes, reflux disease, hypertension, hyperlipidemia, thyroid nodule, anemia, arthritis, anxiety, and depression, whose history and physical exam are concerning for obstructive sleep apnea (OSA). I had a long chat with the patient about my findings and the diagnosis of OSA, its prognosis and treatment options. We talked about medical treatments, surgical interventions and non-pharmacological approaches. I explained in particular the risks and ramifications of untreated moderate to severe OSA, especially with respect to developing cardiovascular disease down the Road, including congestive heart failure, difficult to treat hypertension, cardiac arrhythmias, or stroke. Even type 2 diabetes has, in part, been linked to untreated OSA. Symptoms of untreated OSA include daytime sleepiness, memory problems, mood irritability and mood disorder such as depression and anxiety, lack of energy, as well as recurrent headaches, especially morning headaches. We talked about nicotine cessation and trying to maintain a healthy lifestyle in general,  as well as the importance of weight control. We also talked about the importance of good sleep hygiene. I recommended the following at this time: sleep study.  I outlined the differences between a laboratory attended sleep study versus home sleep test. I explained the sleep test procedure to the patient and also outlined possible surgical and non-surgical treatment options of OSA, including the use of a custom-made dental device (which would require a referral to a specialist dentist or oral surgeon), upper airway surgical options, such as traditional UPPP or a novel less invasive surgical option in the form of Inspire hypoglossal nerve stimulation (which would involve a referral to an ENT surgeon). I also explained the CPAP treatment option to the patient, who indicated that she would be willing to try CPAP if the need arises. I explained the importance of being compliant with PAP treatment, not only for insurance purposes but primarily to improve Her symptoms, and for the patient's long term health benefit, including to reduce Her cardiovascular risks. I answered all her questions today and the patient was in agreement. I plan to see her back after the sleep study is completed and encouraged her to call with any interim questions, concerns, problems or updates.   Thank you very much for allowing me to participate in the care of this nice patient. If I can be of any further assistance to you please do not hesitate to call me at [phone].  Sincerely,   Star Age, MD, PhD

## 2021-08-01 NOTE — Patient Instructions (Signed)

## 2021-08-05 ENCOUNTER — Ambulatory Visit (INDEPENDENT_AMBULATORY_CARE_PROVIDER_SITE_OTHER): Payer: Medicare Other | Admitting: Pharmacist

## 2021-08-05 DIAGNOSIS — E1169 Type 2 diabetes mellitus with other specified complication: Secondary | ICD-10-CM

## 2021-08-05 DIAGNOSIS — E1165 Type 2 diabetes mellitus with hyperglycemia: Secondary | ICD-10-CM

## 2021-08-05 NOTE — Chronic Care Management (AMB) (Signed)
Chronic Care Management Pharmacy Note  08/05/2021 Name:  LARRISSA STIVERS MRN:  [id number] DOB:  [birth year]  Subjective: Alyssa Ford is an 71 y.o. year old female who is a primary patient of Ann Held, DO.  The CCM team was consulted for assistance with disease management and care coordination needs.    Collaboration with PCP and patient assistance program   for  completion and submission of patient assistance program for Rybelsus for 2023  in response to provider referral for pharmacy case management and/or care coordination services.   Consent to Services:  The patient was given information about Chronic Care Management services, agreed to services, and gave verbal consent prior to initiation of services.  Please see initial visit note for detailed documentation.   Patient Care Team: Carollee Herter, Alferd Apa, DO as PCP - General Calvert Cantor, MD as Consulting Physician (Ophthalmology) Levy Sjogren, MD as Referring Physician (Dermatology) Ilda Foil Sol Blazing, MD as Referring Physician (Pediatrics) Renato Shin, MD as Consulting Physician (Endocrinology) Cherre Robins, RPH-CPP (Pharmacist)  Recent office visits: 07/04/2021 - PCP (Dr Carollee Herter) Seen for pulmonary HTN and CAT scan follow up. No med changes noted.  06/10/2021 - Phone Call - Rybelsus PAP shipment arrived and patient notified.  06/02/2021 - Fam Med (Dr Carollee Herter) Seen for angioma of skin. No medication changes.  05/23/2021 - PCP (Dr Etter Sjogren)  seen for vaginal itching. Prescribed fluconazole 150mg  for 1 dose, may repeat in 3 days if needed. Urine dip negative. 05/13/2021 - PCP (Dr Etter Sjogren)  seen for insomnia and hospital follow up. Increased trazodone to 50mg  daily.  Referred to neurology for sleep study. Also prescribed ciprofloxacin 500mg  every 12 hours for UTI.    Recent consult visits: 08/01/2021 - Neurology (Dr Rexene Alberts) Initial visit for fatigue. Work up for sleep disorder. Sleep test  ordered.  07/28/2021 - Endo (Dr Loanne Drilling) F/U type 2 DM. Noted some hypoglycemia. Reduced dose of repaglinide to 1mg  twice a day before meals. Continued acarbose, metformin and Ozempic. F/U 6 months. 07/27/2021 - Pulm (Dr Hunsucher) Seen for DOE / pulmonary HTN evlauation. Noted enlarged pulmonary artery on CT - ordered ECHO to screen for pulm HTN. Also ordered PFTS for DOE. F/U in 3 months. Removed azelastin from med list. 02/2021 - Endo (Dr Loanne Drilling) F/U diabetes and hypothyroidism. Decreased dose of repaglinide 2mg  form 3 times a day to twice a day  Hospital visits: 04/30/2021 - ED Visit for hematuria. Prescribed cephalexin 500mg  4 times a day  Objective:  Lab Results  Component Value Date   CREATININE 0.80 07/04/2021   CREATININE 0.88 04/30/2021   CREATININE 0.81 02/03/2021    Lab Results  Component Value Date   HGBA1C 6.2 (A) 07/28/2021   Last diabetic Eye exam:  Lab Results  Component Value Date/Time   HMDIABEYEEXA Retinopathy (A) 06/17/2018 12:00 AM    Last diabetic Foot exam:  Lab Results  Component Value Date/Time   HMDIABFOOTEX normal 06/06/2010 12:00 AM        Component Value Date/Time   CHOL 109 07/04/2021 1324   TRIG 197.0 (H) 07/04/2021 1324   TRIG 216 (HH) 04/24/2006 0741   HDL 43.70 07/04/2021 1324   CHOLHDL 2 07/04/2021 1324   VLDL 39.4 07/04/2021 1324   LDLCALC 25 07/04/2021 1324   LDLDIRECT 87.0 06/07/2020 0838    Hepatic Function Latest Ref Rng & Units 07/04/2021 02/03/2021 09/06/2020  Total Protein 6.0 - 8.3 g/dL 7.1 7.3 7.1  Albumin 3.5 - 5.2 g/dL 4.7  4.5 4.5  AST 0 - 37 U/L 17 27 20   ALT 0 - 35 U/L 18 33 27  Alk Phosphatase 39 - 117 U/L 62 56 49  Total Bilirubin 0.2 - 1.2 mg/dL 0.4 0.4 0.4  Bilirubin, Direct 0.0 - 0.3 mg/dL - - -    Lab Results  Component Value Date/Time   TSH 3.66 02/03/2021 08:46 AM   TSH 6.51 (H) 08/24/2020 07:56 AM   FREET4 1.31 08/24/2020 07:56 AM   FREET4 1.28 04/22/2020 08:12 AM    CBC Latest Ref Rng & Units  04/30/2021 08/04/2020 01/06/2020  WBC 4.0 - 10.5 K/uL 12.2(H) 6.7 9.0  Hemoglobin 12.0 - 15.0 g/dL 01/08/2020 78.7 77.0  Hematocrit 36.0 - 46.0 % 38.4 38.2 38.8  Platelets 150 - 400 K/uL 247 257 260.0    Lab Results  Component Value Date/Time   VD25OH 43 02/23/2010 09:05 PM   VD25OH 25 (L) 07/14/2009 09:49 PM    Clinical ASCVD: Yes  The ASCVD Risk score (Arnett DK, et al., 2019) failed to calculate for the following reasons:   The valid total cholesterol range is 130 to 320 mg/dL     Social History   Tobacco Use  Smoking Status Former   Packs/day: 1.40   Years: 49.00   Pack years: 68.60   Types: Cigarettes   Quit date: 07/16/2015   Years since quitting: 6.0  Smokeless Tobacco Never  Tobacco Comments   Continues to vape but states pod do not contain nicotine   BP Readings from Last 3 Encounters:  08/01/21 116/62  07/28/21 130/72  07/27/21 122/64   Pulse Readings from Last 3 Encounters:  08/01/21 79  07/28/21 72  07/27/21 72   Wt Readings from Last 3 Encounters:  08/01/21 149 lb 6.4 oz (67.8 kg)  07/28/21 148 lb 12.8 oz (67.5 kg)  07/27/21 149 lb 6.4 oz (67.8 kg)    Assessment: Review of patient past medical history, allergies, medications, health status, including review of consultants reports, laboratory and other test data, was performed as part of comprehensive evaluation and provision of chronic care management services.   SDOH:  (Social Determinants of Health) assessments and interventions performed:     CCM Care Plan  Allergies  Allergen Reactions   Codeine Nausea Only    Severe nausea    Medications Reviewed Today     Reviewed by 09/24/21, RPH-CPP (Pharmacist) on 08/05/21 at 1115  Med List Status: <None>   Medication Order Taking? Sig Documenting Provider Last Dose Status Informant  acarbose (PRECOSE) 50 MG tablet 10/03/21 Yes TAKE 1 TABLET(50 MG) BY MOUTH THREE TIMES DAILY WITH MEALS [id number], MD Taking Active   amitriptyline (ELAVIL) 25 MG  tablet Romero Belling Yes Take by mouth at bedtime. [provider] Taking Active   aspirin EC 81 MG tablet [id number] Yes Take 81 mg by mouth daily. [id number], DO Taking Active Self  BIOTIN PO Donato Schultz Yes Take 1 tablet by mouth daily.  [provider] Taking Active Self  bromocriptine (PARLODEL) 2.5 MG tablet [id number] Yes TAKE 1 TABLET(2.5 MG) BY MOUTH DAILY [id number], MD Taking Active   Coenzyme Q10 (CO Q 10 PO) Romero Belling Yes Take 1 capsule by mouth daily. [provider] Taking Active   fenofibrate 160 MG tablet [id number] Yes TAKE 1 TABLET(160 MG) BY MOUTH DAILY [id number], DO Taking Active   Ferrous Sulfate (IRON SUPPLEMENT PO) Seabron Spates Yes Take 1 tablet by mouth daily. 65mg  [provider] Taking  Active   ipratropium (ATROVENT) 0.06 % nasal spray [id number] Yes Place 1 spray into both nostrils 2 (two) times daily. [provider] Taking Active   latanoprost (XALATAN) 0.005 % ophthalmic solution [id number] Yes Place 1 drop into both eyes at bedtime.  [provider] Taking Active            Med Note Marikay Alar Aug 19, 2020  3:42 PM)    levothyroxine (SYNTHROID) 25 MCG tablet [id number] Yes Take 1 tablet (25 mcg total) by mouth daily. Renato Shin, MD Taking Active   lisinopril (ZESTRIL) 5 MG tablet [id number] Yes Take 1 tablet (5 mg total) by mouth daily. Carollee Herter, Kendrick Fries R, DO Taking Active   metFORMIN (GLUCOPHAGE-XR) 500 MG 24 hr tablet [id number] Yes TAKE 4 TABLETS(2000 MG) BY MOUTH DAILY WITH Paticia Stack, MD Taking Active   Naproxen Sodium (ALEVE PO) [id number] Yes Take 2 tablets by mouth daily.  [provider] Taking Active Self           Med Note Dorothyann Peng, MARTHA A   Tue Jan 18, 2021  9:00 AM) Taking as needed  omeprazole (PRILOSEC) 40 MG capsule [id number] Yes TAKE 1 CAPSULE(40 MG) BY MOUTH DAILY Carollee Herter, Alferd Apa, DO Taking Active   repaglinide (PRANDIN) 1 MG tablet [id number]  Yes Take 1 tablet (1 mg total) by mouth 2 (two) times daily before a meal. Renato Shin, MD Taking Active   rosuvastatin (CRESTOR) 40 MG tablet [id number] Yes TAKE 1 TABLET(40 MG) BY MOUTH AT BEDTIME Carollee Herter, Kendrick Fries R, DO Taking Active   Semaglutide (RYBELSUS) 7 MG TABS [id number] Yes Take 7 mg by mouth daily. Renato Shin, MD Taking Active            Med Note [facility], Sandre Kitty   Fri Aug 05, 2021 11:15 AM)              Patient Active Problem List   Diagnosis Date Noted   Pulmonary HTN ([place]) 07/04/2021   Hyperlipidemia associated with type 2 diabetes mellitus ([place]) 07/04/2021   Atherosclerosis of aorta ([place]) 07/04/2021   Angioma of skin 06/02/2021   Vaginal itching 05/23/2021   Hematuria 05/23/2021   Insomnia 05/13/2021   Hypothyroidism 08/24/2020   Chest pain 08/06/2020   Trichimoniasis 04/27/2020   Cervical cancer screening 04/27/2020   Left thyroid nodule 04/13/2020   Vaginal odor 01/11/2020   Icterus 01/11/2020   Uncontrolled type 2 diabetes mellitus with hyperglycemia ([place]) 09/22/2019   De Quervain's disease (tenosynovitis) 09/22/2019   Neck mass 07/11/2018   Abnormal WBC count 05/29/2017   Skin fissure 09/24/2015   Allergic rhinitis 03/26/2014   Wheezing 03/26/2014   DM (diabetes mellitus) type II uncontrolled, periph vascular disorder 08/25/2011   Headache(784.0) 08/21/2011   Acute sinusitis 04/26/2010   URI 04/26/2010   UNSPECIFIED CATARACT 02/23/2010   POSTMENOPAUSAL STATUS 12/12/2007   FATIGUE 09/11/2007   THYROID NODULE 11/22/2006   Diabetes ([place]) 11/22/2006   Hyperlipidemia LDL goal <70 11/22/2006   GLUCOMA 11/22/2006   Essential hypertension 11/22/2006   GERD 11/22/2006    Immunization History  Administered Date(s) Administered   Fluad Quad(high Dose 65+) 04/08/2019, 04/08/2020   Influenza Split 04/13/2011   Influenza Whole 04/17/2006   Influenza, High Dose Seasonal PF 04/12/2016, 05/07/2017, 04/08/2018   Influenza, Seasonal, Injecte,  Preservative Fre 05/22/2012   Influenza,inj,Quad PF,6+ Mos 03/03/2014, 03/18/2015   Influenza-Unspecified 05/07/2017, 05/02/2021   PFIZER(Purple Top)SARS-COV-2 Vaccination 07/21/2019, 08/11/2019, 04/27/2020, 11/23/2020   Pfizer Covid-19 Vaccine Bivalent  Booster 54yrs & up 05/03/2021   Pneumococcal Conjugate-13 09/13/2015, 05/07/2017, 04/15/2019   Pneumococcal Polysaccharide-23 03/31/2003, 11/03/2008, 05/07/2017   Td 10/24/1999, 02/23/2010   Tdap 02/21/2019   Zoster Recombinat (Shingrix) 03/09/2017, 06/23/2017   Zoster, Live 12/25/2011    Conditions to be addressed/monitored: CAD, HTN and DMII  Care Plan : General Pharmacy (Adult)  Updates made by Cherre Robins, RPH-CPP since 08/05/2021 12:00 AM     Problem: Medication management and chronic disease management, support, education and care coordination for diabetes, HTN, HDL, nerve pain, anxiety, glaucoma, insomnia and GERD   Priority: High  Onset Date: 11/10/2020  Note:   Current Barriers:  Unable to maintain control of type 2 DM (A1c improved and at goal as of 11/17/20) Difficulty sleeping not improved with current therapy Chronic Disease Management support, education, and care coordination needed in related to Diabetes, Hypertension, Hyperlipidemia, Nerve Pain, Anxiety, Glaucoma, GERD, Insomnia  Pharmacist Clinical Goal(s):  Over the next 90 days, patient will verbalize ability to afford treatment regimen achieve control of diabetes as evidenced by A1c < 7.0% maintain control of hyperlipidemia and HTN as evidenced by maintaining goals listed below  Improve sleep duration and quality  through collaboration with PharmD and provider.   Interventions: 1:1 collaboration with Carollee Herter, Alferd Apa, DO regarding development and update of comprehensive plan of care as evidenced by provider attestation and co-signature Inter-disciplinary care team collaboration (see longitudinal plan of care) Comprehensive medication review performed;  medication list updated in electronic medical record  Hypertension Controlled; BP goal <140/90 Current regimen:  Lisinopril 5mg  daily in morning Interventions: (addressed at previous visit) Check blood pressure weekly, document, and provide at future appointments Ensure daily salt intake < 2300 mg/day  Hyperlipidemia Controlled; LDL goal < 100 and Triglycerides <150 Current regimen:  Rosuvastatin 40mg  daily (for LDL / 'bad' cholesterol) Fenofibrate 160mg  dialy (for triglycerides) Coenzyne Q10 200mg  daily Interventions: (addressed at previous visit) Reviewed goals for triglycerides and LDL. Maintain current cholesterol medication regimen.   Diabetes Controlled; A1c goal <7%  Managed by Dr Loanne Drilling - last appt 07/28/2021 Recent home BG readings have been improving Checking every other day Ranging from 90 to 150 Denies hypoglycemia but did state prior to recent dose decrease for repaglinide patient was "taking snacks when I would do morning errands to prevent my blood glucose from getting too low" She does report that she has noticed decreased appetite since starting Rybelsus. Sometimes she feels fullness but knows that Rybelsus is working well. She did started daily metamucil supplement and constipation has improved.  Patient was enrolled [employer] medication assistance in 2022. Working on 5520 application. SHe reports she about at least 4 weeks of Rybelsus on hand today. Current regimen:  Acarbose 50mg  three times a day with first bite of each meal Metformin XR 500 - take four tablets with morning meal Repaglinide 1mg  - take 2 tablets 30 minutes before each meal (dose decreased 07/28/2021 by Dr Loanne Drilling)  Bromocriptine 2.5mg  daily Rybelsus 7mg  - take daily 30 minutes before morning meal Interventions;  Recommended to check blood sugar daily, document, and provide at future appointments  Received notification from [employer] patient assistance program that they need updated page 3  of patient assistance program application for Rybelsus. Was missing patient signature and date (because version of application sent in was 2022 and app was updated for 2023). Mailed patient updated page 3. SHe will complete and bring back to our office to be faxed.  Continue to walk daily to achieve goal  of 150 minutes per week of moderate intensity exercise (like walking)  GERD (goal -  reduce symptoms of GERD) Controlled Denies breakthrough GERD symptoms except when she occasionally forgets to take omeprazole Current regimen:  Omeprazole $RemoveBef'40mg'[id number]$  daily Interventions: (addressed at previous visit) Maintain current medication regimen for acid relfux  Thyroid Health:  Lab Results  Component Value Date   TSH 3.66 02/03/2021  Dr Loanne Drilling started thyroid supplementation 11/17/20 Current regimen:  Levothyroxine 71mcg daily - started 11/17/2020 Interventions: Patient advised to hold Biotin at least 1 week prior to any thyroid testing Follow up with Dr Loanne Drilling as planned.   Medication management Current pharmacy: [facility] Interventions Comprehensive medication review performed. Continue current medication management strategy Reviewed medication refill history to assess adherence  Patient Goals/Self-Care Activities Over the next 90 days, patient will:  take medications as prescribed, check glucose daily, document, and provide at future appointments, and collaborate with provider on medication access solutions  Follow Up Plan: Telephone follow up appointment with care management team member scheduled for:  2 weeks to check on patient assistance          Medication Assistance: Rybelsus patient assistance program application for 8184 has been returned to office and was faxed 07/13/2021. Received notification that page 3 needs to be updated. See care plan for more information.   Patient's preferred pharmacy is:  [facility] [place], [address] AT [address] [place] [address] Phone: [phone] Fax: [phone]    Follow Up:  Patient agrees to Care Plan and Follow-up.  Plan: The care management team will reach out to the patient again over the next 30 days.   Cherre Robins, PharmD Clinical Pharmacist [facility] [phone]

## 2021-08-05 NOTE — Patient Instructions (Signed)
Alyssa Ford It was a pleasure speaking with you today.  I have attached a summary of our visit today and information about your health goals.   Patient Goals/Self-Care Activities Over the next 30 days, patient will:  take medications as prescribed,  check glucose daily, document, and provide at future appointments, and  collaborate with provider on medication access solutions - complete updated page 3 of [place] patient assistance program application for Rybelsus and return to [facility] office.  If you have any questions or concerns, please feel free to contact me either at the phone number below or with a MyChart message.   Keep up the good work!  Cherre Robins, PharmD Clinical Pharmacist [facility] [phone] (direct line)  [phone] (main office number)   CARE PLAN ENTRY (see longitudinal plan of care for additional care plan information)  Current Barriers:  Chronic Disease Management support, education, and care coordination needs related to Diabetes, Hypertension, Hyperlipidemia, Nerve Pain, Anxiety, Glaucoma, GERD, Insomnia   Hypertension BP Readings from Last 3 Encounters:  08/01/21 116/62  07/28/21 130/72  07/27/21 122/64   Pharmacist Clinical Goal(s): Over the next 90 days, patient will work with PharmD and providers to maintain BP goal <140/90 Current regimen:  Lisinopril 5mg  daily in morning Patient self care activities - Over the next 90 days, patient will: Check blood pressure weekly, document, and provide at future appointments Ensure daily salt intake < 2300 mg/day  Hyperlipidemia Lab Results  Component Value Date   CHOL 109 07/04/2021   CHOL 113 02/03/2021   CHOL 110 09/06/2020   Lab Results  Component Value Date   HDL 43.70 07/04/2021   HDL 39.50 02/03/2021   HDL 38.20 (L) 09/06/2020   Lab Results  Component Value Date   LDLCALC 25 07/04/2021   [place] 46 02/03/2021   [place] 46  09/06/2020   Lab Results  Component Value Date   TRIG 197.0 (H) 07/04/2021   TRIG 139.0 02/03/2021   TRIG 128.0 09/06/2020   Pharmacist Clinical Goal(s): Over the next 90 days, patient will work with PharmD and providers to maintain LDL goal < 100 and Triglycerides <150 Current regimen:  Rosuvastatin 40mg  daily (for LDL / 'bad' cholesterol) Fenofibrate 160mg  dialy (for triglycerides) Coenzyne Q10 200mg  daily Interventions: Reviewed goals for triglycerides and LDL. Maintain current cholesterol medication regimen.   Diabetes Lab Results  Component Value Date   HGBA1C 6.2 (A) 07/28/2021   Pharmacist Clinical Goal(s): Over the next 90 days, patient will work with PharmD and providers to maintain A1c goal <7% Current regimen:  Acarbose 50mg  three times a day with first bite of each meal Metformin XR 500 - take four tablets with morning meal Repaglinide 1mg  - take 2 tablets 30 minutes before each meal Bromocriptine 2.5mg  daily Rybelsus 7mg  - take daily 30 minutes before morning meal Patient self care activities - Over the next 90 days, patient will: Check blood sugar once daily, document, and provide at future appointments Contact provider with any episodes of hypoglycemia Increase exercise frequency to achieve goal of 150 minutes per week of moderate intensity exercise (like walking)  GERD Pharmacist Clinical Goal(s) Over the next 90 days, patient will work with PharmD and providers to reduce symptoms of GERD Current regimen:  Omeprazole 40mg  daily Patient self care activities - Over the next 90 days, patient will: Maintain current medication regimen acid relfux  Thyroid Health:   Lab Results  Component Value Date   TSH 3.66 02/03/2021  Pharmacist Clinical Goal(s) Over the next 90 days, patient will work with PharmD and providers to supplement thyroid hormone and improve sleep Current regimen:  Levotyroxine 25 mcg daily in morning - started  11/17/2020 Interventions: Patient advised to hold Biotin for 1 week prior to thyroid testing  Patient self care activities - Over the next 90 days, patient will: Do not take Biotin 1 week prior to thyroid testing Follow up with Dr Loanne Drilling as planned.   Medication management Pharmacist Clinical Goal(s): Over the next 90 days, patient will work with PharmD and providers to maintain optimal medication adherence Current pharmacy: [facility] Interventions Comprehensive medication review performed. Continue current medication management strategy Patient self care activities - Over the next 90 days, patient will: Focus on medication adherence by filling medications appropriately  Take medications as prescribed Report any questions or concerns to PharmD and/or provider(s)     Patient verbalizes understanding of instructions and care plan provided today and agrees to view in [place]. Active MyChart status confirmed with patient.

## 2021-08-10 ENCOUNTER — Ambulatory Visit (HOSPITAL_COMMUNITY): Payer: Medicare Other | Attending: Internal Medicine

## 2021-08-10 ENCOUNTER — Other Ambulatory Visit: Payer: Self-pay

## 2021-08-10 DIAGNOSIS — R0609 Other forms of dyspnea: Secondary | ICD-10-CM | POA: Diagnosis not present

## 2021-08-10 LAB — ECHOCARDIOGRAM COMPLETE
Area-P 1/2: 2.95 cm2
S' Lateral: 2.7 cm

## 2021-08-18 ENCOUNTER — Ambulatory Visit: Payer: Medicare Other | Admitting: Pharmacist

## 2021-08-18 DIAGNOSIS — E1165 Type 2 diabetes mellitus with hyperglycemia: Secondary | ICD-10-CM

## 2021-08-18 NOTE — Chronic Care Management (AMB) (Signed)
Chronic Care Management Pharmacy Note  08/18/2021 Name:  Alyssa Ford MRN:  [id number] DOB:  1951-01-07  Subjective: Alyssa Ford is an 71 y.o. year old female who is a primary patient of Ann Held, DO.  The CCM team was consulted for assistance with disease management and care coordination needs.    Collaboration with patient assistance program   for  confirmation of approval of patient assistance program for Rybelsus for 2023  in response to provider referral for pharmacy case management and/or care coordination services.   Consent to Services:  The patient was given information about Chronic Care Management services, agreed to services, and gave verbal consent prior to initiation of services.  Please see initial visit note for detailed documentation.   Patient Care Team: Carollee Herter, Alferd Apa, DO as PCP - General Calvert Cantor, MD as Consulting Physician (Ophthalmology) Levy Sjogren, MD as Referring Physician (Dermatology) Ilda Foil Sol Blazing, MD as Referring Physician (Pediatrics) Renato Shin, MD as Consulting Physician (Endocrinology) Cherre Robins, RPH-CPP (Pharmacist)  Recent office visits: 07/04/2021 - PCP (Dr Carollee Herter) Seen for pulmonary HTN and CAT scan follow up. No med changes noted.  06/10/2021 - Phone Call - Rybelsus PAP shipment arrived and patient notified.  06/02/2021 - Fam Med (Dr Carollee Herter) Seen for angioma of skin. No medication changes.  05/23/2021 - PCP (Dr Etter Sjogren)  seen for vaginal itching. Prescribed fluconazole 12m for 1 dose, may repeat in 3 days if needed. Urine dip negative. 05/13/2021 - PCP (Dr LEtter Sjogren  seen for insomnia and hospital follow up. Increased trazodone to 533mdaily.  Referred to neurology for sleep study. Also prescribed ciprofloxacin 50067mvery 12 hours for UTI.    Recent consult visits: 08/01/2021 - Neurology (Dr AthRexene Albertsnitial visit for fatigue. Work up for sleep disorder. Sleep test ordered.   07/28/2021 - Endo (Dr EllLoanne Drilling/U type 2 DM. Noted some hypoglycemia. Reduced dose of repaglinide to 1mg13mice a day before meals. Continued acarbose, metformin and Ozempic. F/U 6 months. 07/27/2021 - Pulm (Dr Hunsucher) Seen for DOE / pulmonary HTN evlauation. Noted enlarged pulmonary artery on CT - ordered ECHO to screen for pulm HTN. Also ordered PFTS for DOE. F/U in 3 months. Removed azelastin from med list. 02/2021 - Endo (Dr ElliLoanne DrillingU diabetes and hypothyroidism. Decreased dose of repaglinide 2mg 26mm 3 times a day to twice a day  Hospital visits: 04/30/2021 - ED Visit for hematuria. Prescribed cephalexin 500mg 21mmes a day  Objective:  Lab Results  Component Value Date   CREATININE 0.80 07/04/2021   CREATININE 0.88 04/30/2021   CREATININE 0.81 02/03/2021    Lab Results  Component Value Date   HGBA1C 6.2 (A) 07/28/2021   Last diabetic Eye exam:  Lab Results  Component Value Date/Time   HMDIABEYEEXA Retinopathy (A) 06/17/2018 12:00 AM    Last diabetic Foot exam:  Lab Results  Component Value Date/Time   HMDIABFOOTEX normal 06/06/2010 12:00 AM        Component Value Date/Time   CHOL 109 07/04/2021 1324   TRIG 197.0 (H) 07/04/2021 1324   TRIG 216 (HH) 04/24/2006 0741   HDL 43.70 07/04/2021 1324   CHOLHDL 2 07/04/2021 1324   VLDL 39.4 07/04/2021 1324   LDLCALC 25 07/04/2021 1324   LDLDIRECT 87.0 06/07/2020 0838    Hepatic Function Latest Ref Rng & Units 07/04/2021 02/03/2021 09/06/2020  Total Protein 6.0 - 8.3 g/dL 7.1 7.3 7.1  Albumin 3.5 - 5.2 g/dL 4.7 4.5 4.5  AST 0 - 37 U/L _0 ALT 0 - 35 U/L 18 33 27  Alk Phosphatase 39 - 117 U/L 62 56 49  Total Bilirubin 0.2 - 1.2 mg/dL 0.4 0.4 0.4  Bilirubin, Direct 0.0 - 0.3 mg/dL - - -    Lab Results  Component Value Date/Time   TSH 3.66 02/03/2021 08:46 AM   TSH 6.51 (H) 08/24/2020 07:56 AM   FREET4 1.31 08/24/2020 07:56 AM   FREET4 1.28 04/22/2020 08:12 AM    CBC Latest Ref Rng & Units 04/30/2021  08/04/2020 01/06/2020  WBC 4.0 - 10.5 K/uL 12.2(H) 6.7 9.0  Hemoglobin 12.0 - 15.0 g/dL 12.5 12.5 12.9  Hematocrit 36.0 - 46.0 % 38.4 38.2 38.8  Platelets 150 - 400 K/uL 247 257 260.0    Lab Results  Component Value Date/Time   VD25OH 43 02/23/2010 09:05 PM   VD25OH 25 (L) 07/14/2009 09:49 PM    Clinical ASCVD: Yes  The ASCVD Risk score (Arnett DK, et al., 2019) failed to calculate for the following reasons:   The valid total cholesterol range is 130 to 320 mg/dL     Social History   Tobacco Use  Smoking Status Former   Packs/day: 1.40   Years: 49.00   Pack years: 68.60   Types: Cigarettes   Quit date: 07/16/2015   Years since quitting: 6.0  Smokeless Tobacco Never  Tobacco Comments   Continues to vape but states pod do not contain nicotine   BP Readings from Last 3 Encounters:  08/01/21 116/62  07/28/21 130/72  07/27/21 122/64   Pulse Readings from Last 3 Encounters:  08/01/21 79  07/28/21 72  07/27/21 72   Wt Readings from Last 3 Encounters:  08/01/21 149 lb 6.4 oz (67.8 kg)  07/28/21 148 lb 12.8 oz (67.5 kg)  07/27/21 149 lb 6.4 oz (67.8 kg)    Assessment: Review of patient past medical history, allergies, medications, health status, including review of consultants reports, laboratory and other test data, was performed as part of comprehensive evaluation and provision of chronic care management services.   SDOH:  (Social Determinants of Health) assessments and interventions performed:     CCM Care Plan  Allergies  Allergen Reactions   Codeine Nausea Only    Severe nausea    Medications Reviewed Today     Reviewed by Cherre Robins, RPH-CPP (Pharmacist) on 08/18/21 at 10  Med List Status: <None>   Medication Order Taking? Sig Documenting Provider Last Dose Status Informant  acarbose (PRECOSE) 50 MG tablet [id number] No TAKE 1 TABLET(50 MG) BY MOUTH THREE TIMES DAILY WITH MEALS Renato Shin, MD Taking Active   amitriptyline (ELAVIL) 25 MG tablet  [id number] No Take by mouth at bedtime. [provider] Taking Active   aspirin EC 81 MG tablet [id number] No Take 81 mg by mouth daily. Ann Held, DO Taking Active Self  BIOTIN PO [id number] No Take 1 tablet by mouth daily.  [provider] Taking Active Self  bromocriptine (PARLODEL) 2.5 MG tablet [id number] No TAKE 1 TABLET(2.5 MG) BY MOUTH DAILY Renato Shin, MD Taking Active   Coenzyme Q10 (CO Q 10 PO) [id number] No Take 1 capsule by mouth daily. [provider] Taking Active   fenofibrate 160 MG tablet [id number] No TAKE 1 TABLET(160 MG) BY MOUTH DAILY Roma Schanz R, DO Taking Active   Ferrous Sulfate (IRON SUPPLEMENT PO) [id number] No Take 1 tablet by mouth daily. 81m [provider] Taking Active  ipratropium (ATROVENT) 0.06 % nasal spray [id number] No Place 1 spray into both nostrils 2 (two) times daily. [provider] Taking Active   latanoprost (XALATAN) 0.005 % ophthalmic solution [id number] No Place 1 drop into both eyes at bedtime.  [provider] Taking Active            Med Note Marikay Alar Aug 19, 2020  3:42 PM)    levothyroxine (SYNTHROID) 25 MCG tablet [id number] No Take 1 tablet (25 mcg total) by mouth daily. Renato Shin, MD Taking Active   lisinopril (ZESTRIL) 5 MG tablet [id number] No Take 1 tablet (5 mg total) by mouth daily. Carollee Herter, Kendrick Fries R, DO Taking Active   metFORMIN (GLUCOPHAGE-XR) 500 MG 24 hr tablet [id number] No TAKE 4 TABLETS(2000 MG) BY MOUTH DAILY WITH Paticia Stack, MD Taking Active   Naproxen Sodium (ALEVE PO) [id number] No Take 2 tablets by mouth daily.  [provider] Taking Active Self           Med Note Dorothyann Peng, MARTHA A   Tue Jan 18, 2021  9:00 AM) Taking as needed  omeprazole (PRILOSEC) 40 MG capsule [id number] No TAKE 1 CAPSULE(40 MG) BY MOUTH DAILY Carollee Herter, Alferd Apa, DO Taking Active   repaglinide (PRANDIN) 1 MG tablet [id number] No Take 1 tablet (1 mg  total) by mouth 2 (two) times daily before a meal. Renato Shin, MD Taking Active   rosuvastatin (CRESTOR) 40 MG tablet [id number] No TAKE 1 TABLET(40 MG) BY MOUTH AT BEDTIME Carollee Herter, Kendrick Fries R, DO Taking Active   Semaglutide (RYBELSUS) 7 MG TABS [id number] No Take 7 mg by mouth daily. Renato Shin, MD Taking Active            Med Note [facility] Sandre Kitty   Fri Aug 05, 2021 11:15 AM)              Patient Active Problem List   Diagnosis Date Noted   Pulmonary HTN ([place]) 07/04/2021   Hyperlipidemia associated with type 2 diabetes mellitus ([place]) 07/04/2021   Atherosclerosis of aorta ([place]) 07/04/2021   Angioma of skin 06/02/2021   Vaginal itching 05/23/2021   Hematuria 05/23/2021   Insomnia 05/13/2021   Hypothyroidism 08/24/2020   Chest pain 08/06/2020   Trichimoniasis 04/27/2020   Cervical cancer screening 04/27/2020   Left thyroid nodule 04/13/2020   Vaginal odor 01/11/2020   Icterus 01/11/2020   Uncontrolled type 2 diabetes mellitus with hyperglycemia ([place]) 09/22/2019   De Quervain's disease (tenosynovitis) 09/22/2019   Neck mass 07/11/2018   Abnormal WBC count 05/29/2017   Skin fissure 09/24/2015   Allergic rhinitis 03/26/2014   Wheezing 03/26/2014   DM (diabetes mellitus) type II uncontrolled, periph vascular disorder 08/25/2011   Headache(784.0) 08/21/2011   Acute sinusitis 04/26/2010   URI 04/26/2010   UNSPECIFIED CATARACT 02/23/2010   POSTMENOPAUSAL STATUS 12/12/2007   FATIGUE 09/11/2007   THYROID NODULE 11/22/2006   Diabetes ([place]) 11/22/2006   Hyperlipidemia LDL goal <70 11/22/2006   GLUCOMA 11/22/2006   Essential hypertension 11/22/2006   GERD 11/22/2006    Immunization History  Administered Date(s) Administered   Fluad Quad(high Dose 65+) 04/08/2019, 04/08/2020   Influenza Split 04/13/2011   Influenza Whole 04/17/2006   Influenza, High Dose Seasonal PF 04/12/2016, 05/07/2017, 04/08/2018   Influenza, Seasonal, Injecte, Preservative Fre 05/22/2012    Influenza,inj,Quad PF,6+ Mos 03/03/2014, 03/18/2015   Influenza-Unspecified 05/07/2017, 05/02/2021   PFIZER(Purple Top)SARS-COV-2 Vaccination 07/21/2019, 08/11/2019, 04/27/2020, 11/23/2020   Pfizer Covid-19 Vaccine Bivalent Booster 92yr &  up 05/03/2021   Pneumococcal Conjugate-13 09/13/2015, 05/07/2017, 04/15/2019   Pneumococcal Polysaccharide-23 03/31/2003, 11/03/2008, 05/07/2017   Td 10/24/1999, 02/23/2010   Tdap 02/21/2019   Zoster Recombinat (Shingrix) 03/09/2017, 06/23/2017   Zoster, Live 12/25/2011    Conditions to be addressed/monitored: CAD, HTN and DMII  Care Plan : General Pharmacy (Adult)  Updates made by Cherre Robins, RPH-CPP since 08/18/2021 12:00 AM     Problem: Medication management and chronic disease management, support, education and care coordination for diabetes, HTN, HDL, nerve pain, anxiety, glaucoma, insomnia and GERD   Priority: High  Onset Date: 11/10/2020  Note:   Current Barriers:  Unable to maintain control of type 2 DM (A1c improved and at goal as of 11/17/20) Difficulty sleeping not improved with current therapy Chronic Disease Management support, education, and care coordination needed in related to Diabetes, Hypertension, Hyperlipidemia, Nerve Pain, Anxiety, Glaucoma, GERD, Insomnia  Pharmacist Clinical Goal(s):  Over the next 90 days, patient will verbalize ability to afford treatment regimen achieve control of diabetes as evidenced by A1c < 7.0% maintain control of hyperlipidemia and HTN as evidenced by maintaining goals listed below  Improve sleep duration and quality  through collaboration with PharmD and provider.   Interventions: 1:1 collaboration with Carollee Herter, Alferd Apa, DO regarding development and update of comprehensive plan of care as evidenced by provider attestation and co-signature Inter-disciplinary care team collaboration (see longitudinal plan of care) Comprehensive medication review performed; medication list updated in  electronic medical record  Hypertension Controlled; BP goal <140/90 Current regimen:  Lisinopril 15m daily in morning Interventions: (addressed at previous visit) Check blood pressure weekly, document, and provide at future appointments Ensure daily salt intake < 2300 mg/day  Hyperlipidemia Controlled; LDL goal < 100 and Triglycerides <150 Current regimen:  Rosuvastatin 463mdaily (for LDL / 'bad' cholesterol) Fenofibrate 16047mialy (for triglycerides) Coenzyne Q10 200m51mily Interventions: (addressed at previous visit) Reviewed goals for triglycerides and LDL. Maintain current cholesterol medication regimen.   Diabetes Controlled; A1c goal <7%  Managed by Dr ElliLoanne Drillingast appt 07/28/2021 Recent home BG readings have been improving Checking every other day Ranging from 90 to 150 Denies hypoglycemia but did state prior to recent dose decrease for repaglinide patient was "taking snacks when I would do morning errands to prevent my blood glucose from getting too low" She does report that she has noticed decreased appetite since starting Rybelsus. Sometimes she feels fullness but knows that Rybelsus is working well. She did started daily metamucil supplement and constipation has improved.  Patient was enrolled [employer] assistance in 2022. 07/29/2021 received notification from [employer] assistance program that they need updated page 3 of patient assistance program application for Rybelsus. Was missing patient signature and date (because version of application sent in was 2022 and app was updated for 2023). Mailed patient updated page 3. SHe will complete and bring back to our office to be faxed.  Current regimen:  Acarbose 50mg59mee times a day with first bite of each meal Metformin XR 500 - take four tablets with morning meal Repaglinide 1mg -6mke 2 tablets 30 minutes before each meal (dose decreased 07/28/2021 by Dr EllisoLoanne Drillingmocriptine 2.5mg  d42my Rybelsus 7mg - t4m daily 30 minutes before morning meal Interventions;  Recommended to check blood sugar daily, document, and provide at future appointments Coordinated with [employer] assistance program. Patient has been approved to receive Rybelsus 7mg from65mogram thru 05/25/2022. Shipment in progress - no delivery date or  tracking number available.  Continue to walk daily to achieve goal of 150 minutes per week of moderate intensity exercise  GERD (goal -  reduce symptoms of GERD) Controlled Denies breakthrough GERD symptoms except when she occasionally forgets to take omeprazole Current regimen:  Omeprazole 41m daily Interventions: (addressed at previous visit) Maintain current medication regimen for acid relfux  Thyroid Health:  Lab Results  Component Value Date   TSH 3.66 02/03/2021  Dr ELoanne Drillingstarted thyroid supplementation 11/17/20 Current regimen:  Levothyroxine 234m daily - started 11/17/2020 Interventions: Patient advised to hold Biotin at least 1 week prior to any thyroid testing Follow up with Dr ElLoanne Drillings planned.   Medication management Current pharmacy: [facility] Interventions Comprehensive medication review performed. Continue current medication management strategy Reviewed medication refill history to assess adherence  Patient Goals/Self-Care Activities Over the next 90 days, patient will:  take medications as prescribed, check glucose daily, document, and provide at future appointments, and collaborate with provider on medication access solutions  Follow Up Plan: Telephone follow up appointment with care management team member scheduled for:  2 weeks to check on patient assistance          Medication Assistance: Rybelsus patient assistance program application for [id number] been approved thru 05/25/2022 - first shipment is in process. No current shipping date or tracking number.   [facility] [address]one: [phone]: [phone]  Follow Up:  Patient agrees to Care Plan and Follow-up.  Plan: The care management team will reach out to the patient again over the next 30 days.   TaCherre RobinsPharmD Clinical Pharmacist [phone]

## 2021-08-18 NOTE — Patient Instructions (Signed)
Mrs. Rhodia Albright It was a pleasure speaking with you today.  I have attached a summary of our visit today and information about your health goals.   If you have any questions or concerns, please feel free to contact me either at the phone number below or with a MyChart message.   Keep up the good work!  Cherre Robins, PharmD Clinical Pharmacist [place] [phone] (direct line)  [phone] (main office number)  Patient Goals/Self-Care Activities Over the next 90 days, patient will:  take medications as prescribed,  check glucose daily, document, and provide at future appointments, and  Approved to received  Rybelsus thru 05/25/2022 - awaiting first shipment - has not shipped yet. Will continue to follow.   Patient verbalizes understanding of instructions and care plan provided today and agrees to view in [place]. Active MyChart status confirmed with patient.

## 2021-08-22 ENCOUNTER — Other Ambulatory Visit: Payer: Self-pay

## 2021-08-22 ENCOUNTER — Ambulatory Visit (INDEPENDENT_AMBULATORY_CARE_PROVIDER_SITE_OTHER): Payer: Medicare Other | Admitting: Neurology

## 2021-08-22 DIAGNOSIS — G4720 Circadian rhythm sleep disorder, unspecified type: Secondary | ICD-10-CM

## 2021-08-22 DIAGNOSIS — R5383 Other fatigue: Secondary | ICD-10-CM

## 2021-08-22 DIAGNOSIS — G4733 Obstructive sleep apnea (adult) (pediatric): Secondary | ICD-10-CM | POA: Diagnosis not present

## 2021-08-22 DIAGNOSIS — R0683 Snoring: Secondary | ICD-10-CM

## 2021-08-22 DIAGNOSIS — R351 Nocturia: Secondary | ICD-10-CM

## 2021-08-22 DIAGNOSIS — G4700 Insomnia, unspecified: Secondary | ICD-10-CM

## 2021-08-23 DIAGNOSIS — E1169 Type 2 diabetes mellitus with other specified complication: Secondary | ICD-10-CM | POA: Diagnosis not present

## 2021-08-23 DIAGNOSIS — E1165 Type 2 diabetes mellitus with hyperglycemia: Secondary | ICD-10-CM | POA: Diagnosis not present

## 2021-08-23 DIAGNOSIS — E785 Hyperlipidemia, unspecified: Secondary | ICD-10-CM | POA: Diagnosis not present

## 2021-08-25 NOTE — Procedures (Signed)
PATIENT'S NAME:  Alyssa Ford, Dost DOB:      18-Nov-1950      MR#:    [id number]     DATE OF RECORDING: 08/22/2021 REFERRING M.D.:  Roma Schanz, DO Study Performed:   Baseline Polysomnogram HISTORY: 71 year old woman with a history of diabetes, reflux disease, hypertension, hyperlipidemia, thyroid nodule, anemia, arthritis, anxiety, and depression, who reports snoring and difficulty maintaining sleep for years.  She has been on amitriptyline at night for many years. The patient endorsed the Epworth Sleepiness Scale at 5 points. The patient's weight 149 pounds with a height of 66 (inches), resulting in a BMI of 24.1 kg/m2. The patient's neck circumference measured 15 inches.  CURRENT MEDICATIONS: Precose, Elavil, Aspirin, Biotin, Parlodel, CO Q 10, Iron Supplement, Atrovent, Xalatan, Synthroid, Zestril, Glucophage- XR, Aleve, Prilosec, Prandin, Crestor, Rybelsus   PROCEDURE:  This is a multichannel digital polysomnogram utilizing the Somnostar 11.2 system.  Electrodes and sensors were applied and monitored per AASM Specifications.   EEG, EOG, Chin and Limb EMG, were sampled at 200 Hz.  ECG, Snore and Nasal Pressure, Thermal Airflow, Respiratory Effort, CPAP Flow and Pressure, Oximetry was sampled at 50 Hz. Digital video and audio were recorded.      BASELINE STUDY  Lights Out was at 21:48 and Lights On at 04:57.  Total recording time (TRT) was 429.5 minutes, with a total sleep time (TST) of 235.5 minutes.   The patient's sleep latency was 82.5 minutes, which is markedly dleayed. REM sleep was absent. The sleep efficiency was 54.8%, which is reduced.     SLEEP ARCHITECTURE: WASO (Wake after sleep onset) was 111.5 minutes with one longer period of wakefulness and otherwise mild to moderate sleep fragmentation noted. There were 33.5 minutes in Stage N1, 170.5 minutes Stage N2, 31.5 minutes Stage N3 and 0 minutes in Stage REM.  The percentage of Stage N1 was 14.2%, which is increased, Stage N2 was 72.4%,  which is increased, Stage N3 was 13.4% and Stage R (REM sleep) was absent. The arousals were noted as: 62 were spontaneous, 1 were associated with PLMs, 0 were associated with respiratory events.  RESPIRATORY ANALYSIS:  There were a total of 0 respiratory events:  0 obstructive apneas, 0 central apneas and 0 mixed apneas with a total of 0 apneas and an apnea index (AI) of 0 /hour. There were 0 hypopneas with a hypopnea index of 0 /hour. The patient also had 0 respiratory event related arousals (RERAs).      The total APNEA/HYPOPNEA INDEX (AHI) was 0/hour and the total RESPIRATORY DISTURBANCE INDEX was  0 /hour.  0 events occurred in REM sleep and 0 events in NREM. The REM AHI was  0 /hour, versus a non-REM AHI of 0. The patient spent 0 minutes of total sleep time in the supine position and 236 minutes in non-supine.. The supine AHI was 0.0 versus a non-supine AHI of 0.0.  OXYGEN SATURATION & C02:  The Wake baseline 02 saturation was 95%, with the lowest being 92%. Time spent below 89% saturation equaled 0 minutes.    PERIODIC LIMB MOVEMENTS: The patient had a total of 41 Periodic Limb Movements.  The Periodic Limb Movement (PLM) index was 10.4 and the PLM Arousal index was .3/hour.  Audio and video analysis did not show any abnormal or unusual movements, behaviors, phonations or vocalizations. The patient took no bathroom breaks. Mild intermittent snoring was noted. The EKG was in keeping with normal sinus rhythm (NSR).  Post-study, the patient indicated that  sleep was worse than usual.   IMPRESSION:  Primary Snoring Dysfunctions associated with sleep stages or arousal from sleep  RECOMMENDATIONS:  This study does not demonstrate any significant obstructive or central sleep disordered breathing with the exception of mild, intermittent snoring. The study was limited due to absence of supine and REM sleep. This study does not support an intrinsic sleep disorder as a cause of the patient's  symptoms. Other causes, including circadian rhythm disturbances, an underlying mood disorder, medication effect and/or an underlying medical problem cannot be ruled out. This study shows sleep fragmentation and abnormal sleep stage percentages; these are nonspecific findings and per se do not signify an intrinsic sleep disorder or a cause for the patient's sleep-related symptoms. Causes include (but are not limited to) the first night effect of the sleep study, circadian rhythm disturbances, medication effect or an underlying mood disorder or medical problem.  The patient should be cautioned not to drive, work at heights, or operate dangerous or heavy equipment when tired or sleepy. Review and reiteration of good sleep hygiene measures should be pursued with any patient. The patient will be advised to follow up with the referring provider, who will be notified of the test results.  I certify that I have reviewed the entire raw data recording prior to the issuance of this report in accordance with the Standards of Accreditation of the American Academy of Sleep Medicine (AASM)  Star Age, MD, PhD Diplomat, American Board of Neurology and Sleep Medicine (Neurology and Sleep Medicine)

## 2021-09-02 ENCOUNTER — Other Ambulatory Visit: Payer: Self-pay | Admitting: Endocrinology

## 2021-09-02 ENCOUNTER — Other Ambulatory Visit: Payer: Self-pay | Admitting: Family Medicine

## 2021-09-02 DIAGNOSIS — G4700 Insomnia, unspecified: Secondary | ICD-10-CM

## 2021-09-08 ENCOUNTER — Other Ambulatory Visit: Payer: Self-pay | Admitting: Family Medicine

## 2021-09-08 ENCOUNTER — Other Ambulatory Visit: Payer: Self-pay | Admitting: Endocrinology

## 2021-09-09 ENCOUNTER — Telehealth: Payer: Self-pay | Admitting: *Deleted

## 2021-09-09 NOTE — Telephone Encounter (Signed)
Pt called and informed medication has arrived. Pt states it will likely be Monday when she picks up medication  ?

## 2021-09-09 NOTE — Telephone Encounter (Signed)
Reviewed application to make sure ordered #120 and #120 Rybelsus '7mg'$  was ordered.  ?Double checked and 4 bottles of 30 or #120 was received.  ?

## 2021-09-09 NOTE — Telephone Encounter (Signed)
Received bottle of Rybelsus #30 tablets from [employer] patient assistance program and placed box on CMA's desk. ?

## 2021-09-15 ENCOUNTER — Telehealth: Payer: Medicare Other

## 2021-10-04 ENCOUNTER — Encounter: Payer: Self-pay | Admitting: Family Medicine

## 2021-10-04 ENCOUNTER — Other Ambulatory Visit: Payer: Medicare Other

## 2021-10-04 ENCOUNTER — Ambulatory Visit (INDEPENDENT_AMBULATORY_CARE_PROVIDER_SITE_OTHER): Payer: Medicare Other | Admitting: Family Medicine

## 2021-10-04 VITALS — BP 110/62 | HR 81 | Temp 97.6°F | Resp 18 | Ht 66.0 in | Wt 151.2 lb

## 2021-10-04 DIAGNOSIS — I10 Essential (primary) hypertension: Secondary | ICD-10-CM | POA: Diagnosis not present

## 2021-10-04 DIAGNOSIS — F5101 Primary insomnia: Secondary | ICD-10-CM

## 2021-10-04 LAB — COMPREHENSIVE METABOLIC PANEL
ALT: 22 U/L (ref 0–35)
AST: 20 U/L (ref 0–37)
Albumin: 4.8 g/dL (ref 3.5–5.2)
Alkaline Phosphatase: 56 U/L (ref 39–117)
BUN: 13 mg/dL (ref 6–23)
CO2: 30 mEq/L (ref 19–32)
Calcium: 10.4 mg/dL (ref 8.4–10.5)
Chloride: 101 mEq/L (ref 96–112)
Creatinine, Ser: 0.76 mg/dL (ref 0.40–1.20)
GFR: 78.77 mL/min (ref >60.00)
Glucose, Bld: 52 mg/dL — ABNORMAL LOW (ref 70–99)
Potassium: 3.9 mEq/L (ref 3.5–5.1)
Sodium: 140 mEq/L (ref 135–145)
Total Bilirubin: 0.3 mg/dL (ref 0.2–1.2)
Total Protein: 7.3 g/dL (ref 6.0–8.3)

## 2021-10-04 MED ORDER — BELSOMRA 10 MG PO TABS: ORAL_TABLET | ORAL | 2 refills | Status: DC

## 2021-10-04 NOTE — Assessment & Plan Note (Signed)
Try belsomra ----  10 mg can go to 20 mg if needed ?She will let us know if it works  ?

## 2021-10-04 NOTE — Progress Notes (Signed)
? ?Established Patient Office Visit ? ?Subjective:  ?Patient ID: Alyssa Ford, female    DOB: 1950-12-19  Age: 71 y.o. MRN: [id number] ? ?CC:  ?Chief Complaint  ?Patient presents with  ? Sleep Concerns  ?  Pt states taking Elavil and melatonin and states sleeping for 1 or 2 hours and then up for 3.   ? ? ?HPI ?Alyssa Ford presents for to discuss sleep.  She has tried otc melatonin and has the elavil which does not work.   She has tried trazadone but it does not work.  She has a sleep study and does not have sleep apnea.   ? ?Past Medical History:  ?Diagnosis Date  ? Anemia   ? Anxiety   ? Arthritis   ? Cataract   ? Chronic headaches   ? Depression   ? Diabetes mellitus   ? Diverticulosis of colon (without mention of hemorrhage) 2003, 2008  ? Colonoscopy  ? GERD (gastroesophageal reflux disease)   ? Glaucoma   ? Hiatal hernia 2003  ? EGD   ? Hyperlipidemia   ? Hypertension   ? Thyroid nodule   ? ? ?Past Surgical History:  ?Procedure Laterality Date  ? CARPAL TUNNEL RELEASE  2006  ? CARPAL TUNNEL RELEASE  12/24/2007  ? CATARACT EXTRACTION, BILATERAL    ? [facility]---  sept and oct 2018  ? CERVICAL FUSION  3/08  ? CHOLECYSTECTOMY    ? FASCIECTOMY Left 08/01/2018  ? Procedure: FASCIECTOMY LEFT RING;  Surgeon: Daryll Brod, MD;  Location: [facility];  Service: Orthopedics;  Laterality: Left;  UPPER ARM  ? TRIGGER FINGER RELEASE Left 08/01/2018  ? Procedure: RELEASE TRIGGER FINGER/A-1 PULLEY LEFTRING;  Surgeon: Daryll Brod, MD;  Location: [facility];  Service: Orthopedics;  Laterality: Left;  ? ? ?Family History  ?Problem Relation Age of Onset  ? Dementia Mother   ? Breast cancer Mother 74  ? Kidney failure Mother 54  ?     died from kidney failure from abx given to her for pneumonia  ? Hyperlipidemia Mother   ? Hypertension Mother   ? Diabetes Mother   ? Cancer Mother 70  ?     breast  ? Parkinsonism Father   ? Diabetes Father 38  ? Hypertension Sister   ? Diabetes Brother   ? Hypertension  Brother   ? Heart attack Brother   ? Heart disease Cousin 61  ?     mi  ? Colon cancer Neg Hx   ? Esophageal cancer Neg Hx   ? Stomach cancer Neg Hx   ? ? ?Social History  ? ?Socioeconomic History  ? Marital status: Married  ?  Spouse name: Not on file  ? Number of children: Not on file  ? Years of education: Not on file  ? Highest education level: Not on file  ?Occupational History  ? Occupation: retired  ?Tobacco Use  ? Smoking status: Former  ?  Packs/day: 1.40  ?  Years: 49.00  ?  Pack years: 68.60  ?  Types: Cigarettes  ?  Quit date: 07/16/2015  ?  Years since quitting: 6.2  ? Smokeless tobacco: Never  ? Tobacco comments:  ?  Continues to vape but states pod do not contain nicotine  ?Vaping Use  ? Vaping Use: Every day  ?Substance and Sexual Activity  ? Alcohol use: Yes  ?  Alcohol/week: 0.0 standard drinks  ?  Comment: once weekly; wine and beer  ?  Drug use: No  ? Sexual activity: Not Currently  ?  Partners: Male  ?Other Topics Concern  ? Not on file  ?Social History Narrative  ? Exercise--  walk  ? ?Social Determinants of Health  ? ?Financial Resource Strain: Low Risk   ? Difficulty of Paying Living Expenses: Not very hard  ?Food Insecurity: No Food Insecurity  ? Worried About Charity fundraiser in the Last Year: Never true  ? Ran Out of Food in the Last Year: Never true  ?Transportation Needs: No Transportation Needs  ? Lack of Transportation (Medical): No  ? Lack of Transportation (Non-Medical): No  ?Physical Activity: Insufficiently Active  ? Days of Exercise per Week: 4 days  ? Minutes of Exercise per Session: 20 min  ?Stress: No Stress Concern Present  ? Feeling of Stress : Only a little  ?Social Connections: Moderately Isolated  ? Frequency of Communication with Friends and Family: More than three times a week  ? Frequency of Social Gatherings with Friends and Family: More than three times a week  ? Attends Religious Services: Never  ? Active Member of Clubs or Organizations: No  ? Attends Theatre manager Meetings: Never  ? Marital Status: Married  ?Intimate Partner Violence: Not At Risk  ? Fear of Current or Ex-Partner: No  ? Emotionally Abused: No  ? Physically Abused: No  ? Sexually Abused: No  ? ? ?Outpatient Medications Prior to Visit  ?Medication Sig Dispense Refill  ? acarbose (PRECOSE) 50 MG tablet TAKE 1 TABLET(50 MG) BY MOUTH THREE TIMES DAILY WITH MEALS 270 tablet 3  ? amitriptyline (ELAVIL) 25 MG tablet TAKE 2 TABLETS(50 MG) BY MOUTH DAILY 180 tablet 1  ? aspirin EC 81 MG tablet Take 81 mg by mouth daily.    ? BIOTIN PO Take 1 tablet by mouth daily.     ? bromocriptine (PARLODEL) 2.5 MG tablet TAKE 1 TABLET(2.5 MG) BY MOUTH DAILY 90 tablet 3  ? Coenzyme Q10 (CO Q 10 PO) Take 1 capsule by mouth daily.    ? fenofibrate 160 MG tablet TAKE 1 TABLET(160 MG) BY MOUTH DAILY 90 tablet 1  ? Ferrous Sulfate (IRON SUPPLEMENT PO) Take 1 tablet by mouth daily. '65mg'$     ? ipratropium (ATROVENT) 0.06 % nasal spray Place 1 spray into both nostrils 2 (two) times daily.    ? latanoprost (XALATAN) 0.005 % ophthalmic solution Place 1 drop into both eyes at bedtime.     ? levothyroxine (SYNTHROID) 25 MCG tablet TAKE 1 TABLET(25 MCG) BY MOUTH DAILY 90 tablet 3  ? lisinopril (ZESTRIL) 5 MG tablet TAKE 1 TABLET(5 MG) BY MOUTH DAILY 90 tablet 1  ? metFORMIN (GLUCOPHAGE-XR) 500 MG 24 hr tablet TAKE 4 TABLETS(2000 MG) BY MOUTH DAILY WITH BREAKFAST 360 tablet 0  ? Naproxen Sodium (ALEVE PO) Take 2 tablets by mouth daily.     ? omeprazole (PRILOSEC) 40 MG capsule TAKE 1 CAPSULE(40 MG) BY MOUTH DAILY 90 capsule 1  ? repaglinide (PRANDIN) 1 MG tablet Take 1 tablet (1 mg total) by mouth 2 (two) times daily before a meal. 180 tablet 3  ? rosuvastatin (CRESTOR) 40 MG tablet TAKE 1 TABLET(40 MG) BY MOUTH AT BEDTIME 90 tablet 1  ? Semaglutide (RYBELSUS) 7 MG TABS Take 7 mg by mouth daily. 90 tablet 3  ? ?No facility-administered medications prior to visit.  ? ? ?Allergies  ?Allergen Reactions  ? Codeine Nausea Only  ?  Severe  nausea  ? ? ?ROS ?  Review of Systems ? ?  ?Objective:  ?  ?Physical Exam ? ?BP 110/62 (BP Location: Right Arm, Patient Position: Sitting, Cuff Size: Normal)   Pulse 81   Temp 97.6 ?F (36.4 ?C) (Oral)   Resp 18   Ht '5\' 6"'$  (1.676 m)   Wt 151 lb 3.2 oz (68.6 kg)   SpO2 99%   BMI 24.40 kg/m?  ?Wt Readings from Last 3 Encounters:  ?10/04/21 151 lb 3.2 oz (68.6 kg)  ?08/01/21 149 lb 6.4 oz (67.8 kg)  ?07/28/21 148 lb 12.8 oz (67.5 kg)  ? ? ? ?Health Maintenance Due  ?Topic Date Due  ? OPHTHALMOLOGY EXAM  04/08/2020  ? DEXA SCAN  08/04/2021  ? MAMMOGRAM  08/12/2021  ? ? ?There are no preventive care reminders to display for this patient. ? ?Lab Results  ?Component Value Date  ? TSH 3.66 02/03/2021  ? ?Lab Results  ?Component Value Date  ? WBC 12.2 (H) 04/30/2021  ? HGB 12.5 04/30/2021  ? HCT 38.4 04/30/2021  ? MCV 89.1 04/30/2021  ? PLT 247 04/30/2021  ? ?Lab Results  ?Component Value Date  ? NA 140 10/04/2021  ? K 3.9 10/04/2021  ? CO2 30 10/04/2021  ? GLUCOSE 52 (L) 10/04/2021  ? BUN 13 10/04/2021  ? CREATININE 0.76 10/04/2021  ? BILITOT 0.3 10/04/2021  ? ALKPHOS 56 10/04/2021  ? AST 20 10/04/2021  ? ALT 22 10/04/2021  ? PROT 7.3 10/04/2021  ? ALBUMIN 4.8 10/04/2021  ? CALCIUM 10.4 10/04/2021  ? ANIONGAP 10 04/30/2021  ? GFR 78.77 10/04/2021  ? ?Lab Results  ?Component Value Date  ? CHOL 109 07/04/2021  ? ?Lab Results  ?Component Value Date  ? HDL 43.70 07/04/2021  ? ?Lab Results  ?Component Value Date  ? [place] 25 07/04/2021  ? ?Lab Results  ?Component Value Date  ? TRIG 197.0 (H) 07/04/2021  ? ?Lab Results  ?Component Value Date  ? CHOLHDL 2 07/04/2021  ? ?Lab Results  ?Component Value Date  ? HGBA1C 6.2 (A) 07/28/2021  ? ? ?  ?Assessment & Plan:  ? ?Problem List Items Addressed This Visit   ? ?  ? Unprioritized  ? Insomnia - Primary  ? Relevant Medications  ? Suvorexant (BELSOMRA) 10 MG TABS  ? Essential hypertension  ? ? ?Meds ordered this encounter  ?Medications  ? Suvorexant (BELSOMRA) 10 MG TABS  ?  Sig: 1  po qhs prn  ?  Dispense:  30 tablet  ?  Refill:  2  ? ? ?Follow-up: Return if symptoms worsen or fail to improve.  ? ? ?Ann Held, DO ?

## 2021-10-04 NOTE — Patient Instructions (Signed)

## 2021-10-12 ENCOUNTER — Telehealth: Payer: Self-pay | Admitting: *Deleted

## 2021-10-12 ENCOUNTER — Other Ambulatory Visit: Payer: Self-pay | Admitting: Family Medicine

## 2021-10-12 DIAGNOSIS — G4700 Insomnia, unspecified: Secondary | ICD-10-CM

## 2021-10-12 MED ORDER — BELSOMRA 20 MG PO TABS: 20.0000 mg | ORAL_TABLET | Freq: Every evening | ORAL | 2 refills | Status: DC | PRN

## 2021-10-12 NOTE — Telephone Encounter (Signed)
Patient wanted you to know that she is taking 2 tablets of the Belsomra and would you sent in new script because she only got 30.  Med list up dated with 2 tabs prn. ?

## 2021-10-13 NOTE — Telephone Encounter (Signed)
Left message on machine for patient to call back.

## 2021-10-27 ENCOUNTER — Telehealth: Payer: Self-pay

## 2021-10-27 NOTE — Telephone Encounter (Signed)
Pt called. LVM advising pt that her Rybelsus has arrived. Placed at the front for pickup  ?

## 2021-10-31 ENCOUNTER — Other Ambulatory Visit: Payer: Self-pay | Admitting: Family Medicine

## 2021-10-31 ENCOUNTER — Other Ambulatory Visit: Payer: Self-pay | Admitting: Emergency Medicine

## 2021-10-31 DIAGNOSIS — Z1231 Encounter for screening mammogram for malignant neoplasm of breast: Secondary | ICD-10-CM

## 2021-11-02 ENCOUNTER — Ambulatory Visit: Payer: Medicare Other | Admitting: Pulmonary Disease

## 2021-11-02 ENCOUNTER — Ambulatory Visit (INDEPENDENT_AMBULATORY_CARE_PROVIDER_SITE_OTHER): Payer: Medicare Other | Admitting: Pulmonary Disease

## 2021-11-02 ENCOUNTER — Encounter: Payer: Self-pay | Admitting: Pulmonary Disease

## 2021-11-02 VITALS — BP 118/66 | HR 82 | Temp 98.2°F | Ht 66.0 in | Wt 152.0 lb

## 2021-11-02 DIAGNOSIS — R0609 Other forms of dyspnea: Secondary | ICD-10-CM

## 2021-11-02 DIAGNOSIS — R42 Dizziness and giddiness: Secondary | ICD-10-CM

## 2021-11-02 LAB — PULMONARY FUNCTION TEST
DL/VA % pred: 111 %
DL/VA: 4.53 ml/min/mmHg/L
DLCO cor % pred: 98 %
DLCO cor: 20.33 ml/min/mmHg
DLCO unc % pred: 98 %
DLCO unc: 20.33 ml/min/mmHg
FEF 25-75 Post: 1.52 L/sec
FEF 25-75 Pre: 1.55 L/sec
FEF2575-%Change-Post: -1 %
FEF2575-%Pred-Post: 77 %
FEF2575-%Pred-Pre: 78 %
FEV1-%Change-Post: 0 %
FEV1-%Pred-Post: 72 %
FEV1-%Pred-Pre: 72 %
FEV1-Post: 1.76 L
FEV1-Pre: 1.76 L
FEV1FVC-%Change-Post: 2 %
FEV1FVC-%Pred-Pre: 101 %
FEV6-%Change-Post: -2 %
FEV6-%Pred-Post: 72 %
FEV6-%Pred-Pre: 74 %
FEV6-Post: 2.22 L
FEV6-Pre: 2.27 L
FEV6FVC-%Pred-Post: 104 %
FEV6FVC-%Pred-Pre: 104 %
FVC-%Change-Post: -1 %
FVC-%Pred-Post: 69 %
FVC-%Pred-Pre: 70 %
FVC-Post: 2.24 L
FVC-Pre: 2.27 L
Post FEV1/FVC ratio: 79 %
Post FEV6/FVC ratio: 100 %
Pre FEV1/FVC ratio: 77 %
Pre FEV6/FVC Ratio: 100 %
RV % pred: 133 %
RV: 3.09 L
TLC % pred: 104 %
TLC: 5.57 L

## 2021-11-02 NOTE — Patient Instructions (Signed)
Performed Full PFT Today.   ?

## 2021-11-02 NOTE — Progress Notes (Signed)
? ?'@Patient'$  ID: Alyssa Ford, female    DOB: 08-16-1950, 71 y.o.   MRN: [id number] ? ?Chief Complaint  ?Patient presents with  ? Follow-up  ?  Pt states that her DOE is doing better since last visit. She had full PFTs done today. No inhalers at this time. She states that she is getting dizzy when she stands up I told her to go talk to her cardiologist about her blood pressure medication   ? ? ?Referring provider: ?Roma Schanz R, * ? ?HPI:  ? ?71 y.o. woman whom we are seeing in follow up for evaluation of possible pulmonary hypertension.   ? ?She returns for routine, planned follow-up.  Interim, CT obtained given concern for pulm hypertension based on enlarged PA on CT scan.  This shows no signs of pulmonary hypertension.  Estimated PASP is normal, RV size and function normal, RA size normal.  This is reassuring.  She had PFTs this morning.  On my review interpretation this reveals spirometry concerning for moderate restriction versus air trapping, no bronchodilator response.  Total lung passed within normal limits, no restriction, RV/TLC ratio elevated consistent with air trapping, DLCO within normal limits.  All in all very reassuring, particularly normal DLCO in conjunction with echocardiogram without any evidence of pulmonary hypertension.  This was explained in detail. ? ?Overall, she feels her condition is improved.  She is walking more.  He is up inclines easier.  Sterile failure with increased exercise.  Breathing is feeling better, is likely representative deconditioning or lack of stamina is because of previous symptoms.  She was encouraged to continue exercising. ? ?Chief complaint today is lightheadedness, dizziness.  When rising from seated position.  Sounds consistent with orthostasis.  Encouraged to discuss with her PCP or cardiologist regarding symptoms and possible alteration of antihypertension medicines. ? ?HPI at initial visit: ?Patient referred after CT chest lung cancer screening  06/08/2021 reviewed and large pulmonary artery in the report.  PCP made referral.  She denies significant dyspnea.  She has mild dyspnea.  Worse on inclines or stairs.  No times a day with things are better or worse.  The position makes his better or worse.  No seasonal environmental factors he identified makes his better or worse.  Present for some time, months.  Maybe longer.  However to say given how mild the symptoms are.  No other alleviating or exacerbating factors. ? ?Reviewed most recent chest imaging chest x-ray 08/04/2020 that on my review and interpretation reveals chronic bilaterally.  Most recent CT chest lung cancer screening 06/08/2021 on my review and interpretation shows tiny less than 4 mm nodules, lung RADS 2 by report, mild emphysema most notable in the upper lobes.  No history of echocardiogram. ? ?PMH: Hypertension, hypothyroid, diabetes ?Surgical history: Carpal tunnel surgery, cervical fusion of the neck, cholecystectomy ?Family history: Mother with history of breast cancer, kidney failure, hypertension, diabetes, father with diabetes ?Social history: Former smoker quit 2017, approxiseventy pack-year history, lives in [place] ? ?Questionaires / Pulmonary Flowsheets:  ? ?ACT:  ?   ? View : No data to display.  ?  ?  ?  ? ? ?MMRC: ?   ? View : No data to display.  ?  ?  ?  ? ? ?Epworth:  ?   ? View : No data to display.  ?  ?  ?  ? ? ?Tests:  ? ?FENO:  ?No results found for: NITRICOXIDE ? ?PFT: ? ?  Latest Ref Rng &  Units 11/02/2021  ?  8:58 AM  ?PFT Results  ?FVC-Pre L 2.27  P  ?FVC-Predicted Pre % 70  P  ?FVC-Post L 2.24  P  ?FVC-Predicted Post % 69  P  ?Pre FEV1/FVC % % 77  P  ?Post FEV1/FCV % % 79  P  ?FEV1-Pre L 1.76  P  ?FEV1-Predicted Pre % 72  P  ?FEV1-Post L 1.76  P  ?DLCO uncorrected ml/min/mmHg 20.33  P  ?DLCO UNC% % 98  P  ?DLCO corrected ml/min/mmHg 20.33  P  ?DLCO COR %Predicted % 98  P  ?DLVA Predicted % 111  P  ?TLC L 5.57  P  ?TLC % Predicted % 104  P  ?RV % Predicted % 133  P   ?  ?P Preliminary result  ?Personally reviewed service from suggestive of moderate restriction versus air trapping.  No bronchodilator response.  TLC within normal limits, RV and RV/TLC ratio are elevated indicative of air trapping.  DLCO within normal limits. ? ?WALK:  ?   ? View : No data to display.  ?  ?  ?  ? ? ?Imaging: ?Personally reviewed and as per EMR discussion in this note ? ?Lab Results: ?Personally reviewed ?CBC ?   ?Component Value Date/Time  ? WBC 12.2 (H) 04/30/2021 1852  ? RBC 4.31 04/30/2021 1852  ? HGB 12.5 04/30/2021 1852  ? HGB 12.7 06/20/2017 1057  ? HCT 38.4 04/30/2021 1852  ? HCT 37.5 06/20/2017 1057  ? PLT 247 04/30/2021 1852  ? PLT 273 06/20/2017 1057  ? MCV 89.1 04/30/2021 1852  ? MCV 85 06/20/2017 1057  ? MCH 29.0 04/30/2021 1852  ? MCHC 32.6 04/30/2021 1852  ? RDW 13.5 04/30/2021 1852  ? RDW 14.8 06/20/2017 1057  ? LYMPHSABS 3.3 04/30/2021 1852  ? LYMPHSABS 3.2 06/20/2017 1057  ? MONOABS 0.7 04/30/2021 1852  ? EOSABS 0.2 04/30/2021 1852  ? EOSABS 0.2 06/20/2017 1057  ? BASOSABS 0.1 04/30/2021 1852  ? BASOSABS 0.1 06/20/2017 1057  ? ? ?BMET ?   ?Component Value Date/Time  ? NA 140 10/04/2021 0959  ? K 3.9 10/04/2021 0959  ? CL 101 10/04/2021 0959  ? CO2 30 10/04/2021 0959  ? GLUCOSE 52 (L) 10/04/2021 0959  ? GLUCOSE 143 (H) 04/24/2006 0741  ? BUN 13 10/04/2021 0959  ? CREATININE 0.76 10/04/2021 0959  ? CALCIUM 10.4 10/04/2021 0959  ? GFRNONAA >60 04/30/2021 1852  ? GFRAA  12/23/2007 1355  ?  >60        ?The eGFR has been calculated ?using the MDRD equation. ?This calculation has not been ?validated in all clinical  ? ? ?BNP ?No results found for: BNP ? ?ProBNP ?No results found for: PROBNP ? ?Specialty Problems   ? ?  ? Pulmonary Problems  ? Acute sinusitis  ?  Qualifier: Diagnosis of  ?By: Jerold Coombe  ? ? ?  ?  ? URI  ?  Qualifier: Diagnosis of  ?By: Jerold Coombe  ? ? ?  ?  ? Allergic rhinitis  ? Wheezing  ? ? ?Allergies  ?Allergen Reactions  ? Codeine Nausea Only  ?  Severe  nausea  ? ? ?Immunization History  ?Administered Date(s) Administered  ? Fluad Quad(high Dose 65+) 04/08/2019, 04/08/2020  ? Influenza Split 04/13/2011  ? Influenza Whole 04/17/2006  ? Influenza, High Dose Seasonal PF 04/12/2016, 05/07/2017, 04/08/2018  ? Influenza, Seasonal, Injecte, Preservative Fre 05/22/2012  ? Influenza,inj,Quad PF,6+ Mos 03/03/2014, 03/18/2015  ? Influenza-Unspecified 05/07/2017,  05/02/2021  ? PFIZER(Purple Top)SARS-COV-2 Vaccination 07/21/2019, 08/11/2019, 04/27/2020, 11/23/2020  ? Pension scheme manager 44yrs & up 05/03/2021  ? Pneumococcal Conjugate-13 09/13/2015, 05/07/2017, 04/15/2019  ? Pneumococcal Polysaccharide-23 03/31/2003, 11/03/2008, 05/07/2017  ? Td 10/24/1999, 02/23/2010  ? Tdap 02/21/2019  ? Zoster Recombinat (Shingrix) 03/09/2017, 06/23/2017  ? Zoster, Live 12/25/2011  ? ? ?Past Medical History:  ?Diagnosis Date  ? Anemia   ? Anxiety   ? Arthritis   ? Cataract   ? Chronic headaches   ? Depression   ? Diabetes mellitus   ? Diverticulosis of colon (without mention of hemorrhage) 2003, 2008  ? Colonoscopy  ? GERD (gastroesophageal reflux disease)   ? Glaucoma   ? Hiatal hernia 2003  ? EGD   ? Hyperlipidemia   ? Hypertension   ? Thyroid nodule   ? ? ?Tobacco History: ?Social History  ? ?Tobacco Use  ?Smoking Status Former  ? Packs/day: 1.40  ? Years: 49.00  ? Pack years: 68.60  ? Types: Cigarettes  ? Quit date: 07/16/2015  ? Years since quitting: 6.3  ?Smokeless Tobacco Never  ?Tobacco Comments  ? Continues to vape but states pod do not contain nicotine  ? ?Counseling given: Not Answered ?Tobacco comments: Continues to vape but states pod do not contain nicotine ? ? ?Continue to not smoke ? ?Outpatient Encounter Medications as of 11/02/2021  ?Medication Sig  ? acarbose (PRECOSE) 50 MG tablet TAKE 1 TABLET(50 MG) BY MOUTH THREE TIMES DAILY WITH MEALS  ? amitriptyline (ELAVIL) 25 MG tablet TAKE 2 TABLETS(50 MG) BY MOUTH DAILY  ? aspirin EC 81 MG tablet Take 81 mg by  mouth daily.  ? BIOTIN PO Take 1 tablet by mouth daily.   ? bromocriptine (PARLODEL) 2.5 MG tablet TAKE 1 TABLET(2.5 MG) BY MOUTH DAILY  ? Coenzyme Q10 (CO Q 10 PO) Take 1 capsule by mouth daily.  ? fenof

## 2021-11-02 NOTE — Patient Instructions (Signed)
Neck to see you again ? ?The heart ultrasound or echocardiogram as well as your breathing test are all very reassuring.  No sign or evidence of pulmonary hypertension.  No further follow-up needed. ? ?Continue exercising.  If the breathing worsens in the future, please contact me and we will be happy to see you at any time. ? ?Return to clinic as needed ?

## 2021-11-02 NOTE — Progress Notes (Signed)
Performed Full PFT today.  ?

## 2021-11-07 ENCOUNTER — Other Ambulatory Visit: Payer: Self-pay | Admitting: Family Medicine

## 2021-11-07 DIAGNOSIS — K219 Gastro-esophageal reflux disease without esophagitis: Secondary | ICD-10-CM

## 2021-11-11 ENCOUNTER — Ambulatory Visit
Admission: RE | Admit: 2021-11-11 | Discharge: 2021-11-11 | Disposition: A | Discharge status: Home / Self Care | Payer: Medicare Other | Source: Ambulatory Visit | Attending: Family Medicine | Admitting: Family Medicine

## 2021-11-11 DIAGNOSIS — Z1231 Encounter for screening mammogram for malignant neoplasm of breast: Secondary | ICD-10-CM

## 2021-12-07 ENCOUNTER — Other Ambulatory Visit: Payer: Self-pay | Admitting: Family Medicine

## 2021-12-09 ENCOUNTER — Other Ambulatory Visit: Payer: Self-pay

## 2021-12-09 DIAGNOSIS — E1142 Type 2 diabetes mellitus with diabetic polyneuropathy: Secondary | ICD-10-CM

## 2021-12-09 MED ORDER — METFORMIN HCL ER 500 MG PO TB24: ORAL_TABLET | ORAL | 0 refills | Status: DC

## 2021-12-23 ENCOUNTER — Ambulatory Visit (INDEPENDENT_AMBULATORY_CARE_PROVIDER_SITE_OTHER): Payer: Medicare Other | Admitting: Pharmacist

## 2021-12-23 DIAGNOSIS — I10 Essential (primary) hypertension: Secondary | ICD-10-CM

## 2021-12-23 DIAGNOSIS — E785 Hyperlipidemia, unspecified: Secondary | ICD-10-CM | POA: Diagnosis not present

## 2021-12-23 DIAGNOSIS — G4700 Insomnia, unspecified: Secondary | ICD-10-CM

## 2021-12-23 DIAGNOSIS — Z7984 Long term (current) use of oral hypoglycemic drugs: Secondary | ICD-10-CM | POA: Diagnosis not present

## 2021-12-23 DIAGNOSIS — E1159 Type 2 diabetes mellitus with other circulatory complications: Secondary | ICD-10-CM | POA: Diagnosis not present

## 2021-12-23 DIAGNOSIS — E1169 Type 2 diabetes mellitus with other specified complication: Secondary | ICD-10-CM

## 2021-12-23 DIAGNOSIS — E039 Hypothyroidism, unspecified: Secondary | ICD-10-CM

## 2021-12-23 DIAGNOSIS — E118 Type 2 diabetes mellitus with unspecified complications: Secondary | ICD-10-CM

## 2021-12-23 NOTE — Patient Instructions (Addendum)
Mrs. Vialpando It was a pleasure speaking with you today Below is a summary of your health goals and at the end of this after visit summary is an updated Care Plan.    Patient Goals/Self-Care Activities Take medications as prescribed,  Check blood sugar daily, document, and provide at future appointments. Also check blood glucose when you have dizziness to make sure not related to low blood glucose (< 70 ). Contact either PCP or endocrinologist office if you are getting blood glucose < 70 Contact endocrinologist office to make appointment with new provider.   Continue to walk daily to achieve goal of 150 minutes per week of moderate intensity exercise Check blood pressure weekly, document, and provide at future appointments Check blood pressure when seated and then again after standing for 1 minute. If systolic blood pressure (the top number in a blood pressure reading) drops by at least 20 mmHg during transition from lying down to standing up, or when diastolic blood pressure (the bottom reading) drops by at least 66mHg within three minutes after standing contact office. Collaborate with provider on medication access solutions. We will start reapplicaiton process for Rybelsus for 2024 at our appointment in November 2023. Due to have TSH rechecked in August 2023. Hold Biotin at least 1 week prior to any thyroid testing Separate iron supplement from levothyroxine by at least 2 hours.   If you have any questions or concerns, please feel free to contact me either at the phone number below or with a MyChart message.   Keep up the good work!  TCherre Robins PharmD Clinical Pharmacist [phone](direct line)  [phone](main office number)   Sleep hygiene recommendations  Avoid caffeine past noon Avoid naps past noon Avoid exercise within 2 hours of bedtime Avoid tobacco or nicotine products, especially within 2 hours of bedtime Avoid  alcohol use, especially within 2 hours of bedtime Avoid heavy meals within 2 hours of bedtime Do not take over-the-counter sleep medications or supplements unless directed by a health care provider Go to sleep and wake up at the same time every day Maximize comfort of sleeping environment by controlling temperature and noise Avoid spending more than 20 minutes awake in bed; engage in an activity that does not involve a light-emitting device until sleepy, and then go back to bed  The patient verbalized understanding of instructions, educational materials, and care plan provided today and agreed to receive a mailed copy of patient instructions, educational materials, and care plan.

## 2021-12-23 NOTE — Chronic Care Management (AMB) (Signed)
Chronic Care Management Pharmacy Note  12/23/2021 Name:  Alyssa Ford MRN:  [id number] DOB:  1951/02/15  Summary:   Hypertension: Controlled; BP goal <140/90; Current regimen: Lisinopril 62m daily in morning. Patient mentioned at appointment with Pulmonologist that she was had an episode of dizziness when arising form seated position. He recommended she check blood pressure sitting and standing. She has not done this yet.  Hyperlipidemia: Controlled; LDL goal < 100 and Triglycerides <150 Diabetes: Controlled; A1c goal <7%. Needs appointment with endocrinologist (around August / September) Insomnia: Not controlled per patient; Goal is 7 to 9 hours of restful sleep. Current therapy: Belsomnra 238mat bedtime as needed (Has not taken in last 2 days). Previous therapy tried: melatonin - not effective, trazodone - not effective. She has taken amitriptyline for several years for jaw pain. It used to help with sleep but not anymore. Bedtime is 9:30 to 10pm; Usually sleeps for about 3 hours and then awakes at 1pm. She then is up about 2 or 3 hours. Back to sleep around 3am and awakes around 7 to 8 am. (Total sleep time is 7 to 8 hours). No naps during day but she does usually lay down to rest for about 1 hours in afternoon.   Recommendations: Reviewed options for sleep and if covered by her insurance. Will consult with PCP about options.  Dayvigo - similar to Belsomnra, may be slightly more effect but associated with increase in daytime somnolence. Not on her formulary and cost is about $300/month.  Ramelton - tier 4 $100 per month on her Part D; No the best choice for her since as not been shown to significantly improved sleep maintenance.  Doxepin / Silenor - usually 3 to 6 mg recommended for sleep - these strengths are not on formulary (cost at [facility] about $140/month); The higher strength doxepin is on formulary 109mnd 63m91me $47 per month (cost at [facility] is about $12/month) Check blood  sugar daily, document, and provide at future appointments. Also check blood glucose when you have dizziness to make sure not related to low blood glucose (< 70 ). Contact either PCP or endocrinologist office if you are getting blood glucose < 70 Contact endocrinologist office to make appointment with new provider.   Continue to walk daily to achieve goal of 150 minutes per week of moderate intensity exercise Check blood pressure weekly, document, and provide at future appointments Check blood pressure when seated and then again after standing for 1 minute. If systolic blood pressure (the top number in a blood pressure reading) drops by at least 20 mmHg during transition from lying down to standing up, or when diastolic blood pressure (the bottom reading) drops by at least 10mm33mithin three minutes after standing contact office. Collaborate with provider on medication access solutions. We will start reapplicaiton process for Rybelsus for 2024 at our appointment in November 2023. Due to have TSH rechecked in August 2023. Hold Biotin at least 1 week prior to any thyroid testing Separate iron supplement from levothyroxine by at least 2 hours.   Subjective: Alyssa Ford is an 70 y.71 year old female who is a primary patient of Alyssa Ford  The CCM team was consulted for assistance with disease management and care coordination needs.    Collaboration with patient assistance program   for  confirmation of approval of patient assistance program for Rybelsus for 2023  in response to provider referral for pharmacy case management and/or  care coordination services.   Consent to Services:  The patient was given information about Chronic Care Management services, agreed to services, and gave verbal consent prior to initiation of services.  Please see initial visit note for detailed documentation.   Patient Care Team: Alyssa Ford, Alyssa Apa, DO as PCP - General Alyssa Cantor, MD as Consulting  Physician (Ophthalmology) Alyssa Sjogren, MD as Referring Physician (Dermatology) Alyssa Ford, Alyssa Blazing, MD as Referring Physician (Pediatrics) Alyssa Shin, MD (Inactive) as Consulting Physician (Endocrinology) Alyssa Ford, RPH-CPP (Pharmacist)  Recent office visits: 10/12/2021 - Phone call - dose of Belsomnra increased to 29m at bedtime  10/04/2021 - Fam Med (Dr LCarollee Ford Seen for insomnia. Reviewed sleep study.  No apnea noted. Prescribed Belsomra 141mat bedtime as needed.  07/04/2021 - PCP (Dr LoCarollee HerterSeen for pulmonary HTN and CAT scan follow up. No med changes noted.  06/10/2021 - Phone Call - Rybelsus PAP shipment arrived and patient notified.  06/02/2021 - Fam Med (Dr LoCarollee HerterSeen for angioma of skin. No medication changes.  05/23/2021 - PCP (Dr LoEtter Ford seen for vaginal itching. Prescribed fluconazole 15032mor 1 dose, may repeat in 3 days if needed. Urine dip negative. 05/13/2021 - PCP (Dr LowEtter Sjogrenseen for insomnia and hospital follow up. Increased trazodone to 105m27mily.  Referred to neurology for sleep study. Also prescribed ciprofloxacin 500mg51mry 12 hours for UTI.    Recent consult visits: 11/02/2021 - Pulm (Dr HunsuSilas Floodmonary function tests performed for DOE. Per Dr Hunsucker's review spirometry revealed concern for moderate restriction versus air trapping, no bronchodilator response.  Total lung passed within normal limits, no restriction, RV/TLC ratio elevated consistent with air trapping, DLCO within normal limits.  All in all very reassuring, particularly normal DLCO in conjunction with echocardiogram without any evidence of pulmonary hypertension.    08/22/2021 - Sleep study completed.  08/01/2021 - Neurology (Dr AtharRexene Albertstial visit for fatigue. Work up for sleep disorder. Sleep test ordered.  07/28/2021 - Endo (Dr EllisLoanne Drilling type 2 DM. Noted some hypoglycemia. Reduced dose of repaglinide to 1mg t58me a day before meals. Continued  acarbose, metformin and Ozempic. F/U 6 months. 07/27/2021 - Pulm (Dr Hunsucher) Seen for DOE / pulmonary HTN evlauation. Noted enlarged pulmonary artery on CT - ordered ECHO to screen for pulm HTN. Also ordered PFTS for DOE. F/U in 3 months. Removed azelastin from med list. 02/2021 - Endo (Dr EllisoLoanne Drillingdiabetes and hypothyroidism. Decreased dose of repaglinide 2mg fo53m3 times a day to twice a day  Hospital visits: 04/30/2021 - ED Visit for hematuria. Prescribed cephalexin 500mg 4 45ms a day  Objective:  Lab Results  Component Value Date   CREATININE 0.76 10/04/2021   CREATININE 0.80 07/04/2021   CREATININE 0.88 04/30/2021    Lab Results  Component Value Date   HGBA1C 6.2 (A) 07/28/2021   Last diabetic Eye exam:  Lab Results  Component Value Date/Time   HMDIABEYEEXA Retinopathy (A) 06/17/2018 12:00 AM    Last diabetic Foot exam:  Lab Results  Component Value Date/Time   HMDIABFOOTEX normal 06/06/2010 12:00 AM        Component Value Date/Time   CHOL 109 07/04/2021 1324   TRIG 197.0 (H) 07/04/2021 1324   TRIG 216 (HH) 04/24/2006 0741   HDL 43.70 07/04/2021 1324   CHOLHDL 2 07/04/2021 1324   VLDL 39.4 07/04/2021 1324   LDLCALC 25 07/04/2021 1324   LDLDIRECT 87.0 06/07/2020 0838       Latest Ref Rng &  Units 10/04/2021    9:59 AM 07/04/2021    1:24 PM 02/03/2021    8:46 AM  Hepatic Function  Total Protein 6.0 - 8.3 g/dL 7.3  7.1  7.3   Albumin 3.5 - 5.2 g/dL 4.8  4.7  4.5   AST 0 - 37 U/L _0 ALT 0 - 35 U/L 22  18  33   Alk Phosphatase 39 - 117 U/L 56  62  56   Total Bilirubin 0.2 - 1.2 mg/dL 0.3  0.4  0.4     Lab Results  Component Value Date/Time   TSH 3.66 02/03/2021 08:46 AM   TSH 6.51 (H) 08/24/2020 07:56 AM   FREET4 1.31 08/24/2020 07:56 AM   FREET4 1.28 04/22/2020 08:12 AM       Latest Ref Rng & Units 04/30/2021    6:52 PM 08/04/2020    9:47 AM 01/06/2020    3:32 PM  CBC  WBC 4.0 - 10.5 K/uL 12.2  6.7  9.0   Hemoglobin 12.0 - 15.0 g/dL  12.5  12.5  12.9   Hematocrit 36.0 - 46.0 % 38.4  38.2  38.8   Platelets 150 - 400 K/uL 247  257  260.0     Lab Results  Component Value Date/Time   VD25OH 43 02/23/2010 09:05 PM   VD25OH 25 (L) 07/14/2009 09:49 PM    Clinical ASCVD: Yes  The ASCVD Risk score (Arnett DK, et al., 2019) failed to calculate for the following reasons:   The valid total cholesterol range is 130 to 320 mg/dL     Social History   Tobacco Use  Smoking Status Former   Packs/day: 1.40   Years: 49.00   Total pack years: 68.60   Types: Cigarettes   Quit date: 07/16/2015   Years since quitting: 6.4  Smokeless Tobacco Never  Tobacco Comments   Continues to vape but states pod do not contain nicotine   BP Readings from Last 3 Encounters:  11/02/21 118/66  10/04/21 110/62  08/01/21 116/62   Pulse Readings from Last 3 Encounters:  11/02/21 82  10/04/21 81  08/01/21 79   Wt Readings from Last 3 Encounters:  11/02/21 152 lb (68.9 kg)  10/04/21 151 lb 3.2 oz (68.6 kg)  08/01/21 149 lb 6.4 oz (67.8 kg)    Assessment: Review of patient past medical history, allergies, medications, health status, including review of consultants reports, laboratory and other test data, was performed as part of comprehensive evaluation and provision of chronic care management services.   SDOH:  (Social Determinants of Health) assessments and interventions performed:  SDOH Interventions    Flowsheet Row Most Recent Value  SDOH Interventions   Financial Strain Interventions Other (Comment)  [assisted in early 2023 with Rybelsus PAP - approved thru 05/2022]  Physical Activity Interventions Intervention Not Indicated        CCM Care Plan  Allergies  Allergen Reactions   Codeine Nausea Only    Severe nausea    Medications Reviewed Today     Reviewed by Alyssa Ford, RPH-CPP (Pharmacist) on 12/23/21 at 1135  Med List Status: <None>   Medication Order Taking? Sig Documenting Provider Last Dose Status Informant   acarbose (PRECOSE) 50 MG tablet [id number] Yes TAKE 1 TABLET(50 MG) BY MOUTH THREE TIMES DAILY WITH MEALS Alyssa Shin, MD Taking Active   amitriptyline (ELAVIL) 25 MG tablet [id number] Yes TAKE 2 TABLETS(50 MG) BY MOUTH DAILY Ann Held, DO Taking Active  aspirin EC 81 MG tablet [id number] Yes Take 81 mg by mouth daily. Ann Held, DO Taking Active Self  BIOTIN PO [id number] Yes Take 1 tablet by mouth daily.  [provider] Taking Active Self  bromocriptine (PARLODEL) 2.5 MG tablet [id number] Yes TAKE 1 TABLET(2.5 MG) BY MOUTH DAILY Alyssa Shin, MD Taking Active   Coenzyme Q10 (CO Q 10 PO) [id number] Yes Take 1 capsule by mouth daily. [provider] Taking Active   fenofibrate 160 MG tablet [id number] Yes TAKE 1 TABLET(160 MG) BY MOUTH DAILY Roma Schanz R, DO Taking Active   Ferrous Sulfate (IRON SUPPLEMENT PO) [id number] Yes Take 1 tablet by mouth daily. 36m [provider] Taking Active            Med Note (Antony Contras Emmert Roethler B   Fri Dec 23, 2021 11:27 AM) Taking every other day due to constipation  ipratropium (ATROVENT) 0.06 % nasal spray [id number] Place 1 spray into both nostrils 2 (two) times daily as needed. [provider] Taking Active   latanoprost (XALATAN) 0.005 % ophthalmic solution [id number] Place 1 drop into both eyes at bedtime.  [provider] Taking Active            Med Note (Marikay AlarFeb 24, 2022  3:42 PM)    levothyroxine (SYNTHROID) 25 MCG tablet [id number] TAKE 1 TABLET(25 MCG) BY MOUTH DAILY ERenato Shin MD Taking Active   lisinopril (ZESTRIL) 5 MG tablet [id number] TAKE 1 TABLET(5 MG) BY MOUTH DAILY LAnn Held DO Taking Active   Melatonin 10 MG TABS [id number] Take 1 tablet by mouth at bedtime as needed (sleep). [provider] Taking Active   metFORMIN (GLUCOPHAGE-XR) 500 MG 24 hr tablet [id number] TAKE 4 TABLETS(2000 MG) BY MOUTH DAILY WITH  BPayton Spark MD Taking Active   Naproxen Sodium (ALEVE PO) [id number] Take 2 tablets by mouth daily.  [provider] Taking Active Self           Med Note (Dorothyann Peng MARTHA A   Tue Jan 18, 2021  9:00 AM) Taking as needed  omeprazole (PRILOSEC) 40 MG capsule [id number] TAKE 1 CAPSULE(40 MG) BY MOUTH DAILY LAnn Held DO Taking Active   Probiotic Product (PROBIOTIC-10 PO) [id number] Take 1 tablet by mouth daily. [provider] Taking Active   repaglinide (PRANDIN) 1 MG tablet [id number] Take 1 tablet (1 mg total) by mouth 2 (two) times daily before a meal. ERenato Shin MD Taking Active   rosuvastatin (CRESTOR) 40 MG tablet [id number] TAKE 1 TABLET(40 MG) BY MOUTH AT BEDTIME  Patient taking differently: Take 40 mg by mouth every morning.   LCarollee Ford YKendrick FriesR, DO Taking Active   Semaglutide (RYBELSUS) 7 MG TABS [id number] Take 7 mg by mouth daily. ERenato Shin MD Taking Active            Med Note ([facility] TAdria DillFeb 23, 2023  3:29 PM) Approved for NovoNordisk medication assistance program thru 05/25/2022  Suvorexant (BELSOMRA) 20 MG TABS [id number] Take 20 mg by mouth at bedtime as needed.  Patient not taking: Reported on 12/23/2021   LAnn Held DO Not Taking Active            Med Note (Antony Contras TSandre Kitty  Fri Dec 23, 2021 11:32 AM) Hasn't taken since 12/20/2021  Patient Active Problem List   Diagnosis Date Noted   Pulmonary HTN ([place]) 07/04/2021   Hyperlipidemia associated with type 2 diabetes mellitus ([place]) 07/04/2021   Atherosclerosis of aorta ([place]) 07/04/2021   Angioma of skin 06/02/2021   Vaginal itching 05/23/2021   Hematuria 05/23/2021   Insomnia 05/13/2021   Hypothyroidism 08/24/2020   Chest pain 08/06/2020   Trichimoniasis 04/27/2020   Cervical cancer screening 04/27/2020   Left thyroid nodule 04/13/2020   Vaginal odor 01/11/2020   Icterus 01/11/2020   Uncontrolled type 2 diabetes  mellitus with hyperglycemia ([place]) 09/22/2019   De Quervain's disease (tenosynovitis) 09/22/2019   Neck mass 07/11/2018   Abnormal WBC count 05/29/2017   Skin fissure 09/24/2015   Allergic rhinitis 03/26/2014   Wheezing 03/26/2014   DM (diabetes mellitus) type II uncontrolled, periph vascular disorder 08/25/2011   Headache(784.0) 08/21/2011   Acute sinusitis 04/26/2010   URI 04/26/2010   UNSPECIFIED CATARACT 02/23/2010   POSTMENOPAUSAL STATUS 12/12/2007   FATIGUE 09/11/2007   THYROID NODULE 11/22/2006   Diabetes ([place]) 11/22/2006   Hyperlipidemia LDL goal <70 11/22/2006   GLUCOMA 11/22/2006   Essential hypertension 11/22/2006   GERD 11/22/2006    Immunization History  Administered Date(s) Administered   Fluad Quad(high Dose 65+) 04/08/2019, 04/08/2020   Influenza Split 04/13/2011   Influenza Whole 04/17/2006   Influenza, High Dose Seasonal PF 04/12/2016, 05/07/2017, 04/08/2018   Influenza, Seasonal, Injecte, Preservative Fre 05/22/2012   Influenza,inj,Quad PF,6+ Mos 03/03/2014, 03/18/2015   Influenza-Unspecified 05/07/2017, 05/02/2021   PFIZER(Purple Top)SARS-COV-2 Vaccination 07/21/2019, 08/11/2019, 04/27/2020, 11/23/2020   Pfizer Covid-19 Vaccine Bivalent Booster 46yr & up 05/03/2021   Pneumococcal Conjugate-13 09/13/2015, 05/07/2017, 04/15/2019   Pneumococcal Polysaccharide-23 03/31/2003, 11/03/2008, 05/07/2017   Td 10/24/1999, 02/23/2010   Tdap 02/21/2019   Zoster Recombinat (Shingrix) 03/09/2017, 06/23/2017   Zoster, Live 12/25/2011    Conditions to be addressed/monitored: CAD, HTN and DMII  Care Plan : General Pharmacy (Adult)  Updates made by ECherre Ford RPH-CPP since 12/23/2021 12:00 AM     Problem: Medication management and chronic disease management, support, education and care coordination for diabetes, HTN, HDL, nerve pain, anxiety, glaucoma, insomnia and GERD   Priority: High  Onset Date: 11/10/2020  Note:   Current Barriers:  Unable to maintain  control of type 2 DM (A1c improved and at goal as of 11/17/20) Difficulty sleeping not improved with current therapy Chronic Disease Management support, education, and care coordination needed in related to Diabetes, Hypertension, Hyperlipidemia, Nerve Pain, Anxiety, Glaucoma, GERD, Insomnia  Pharmacist Clinical Goal(s):  Over the next 90 days, patient will verbalize ability to afford treatment regimen achieve control of diabetes as evidenced by A1c < 7.0% maintain control of hyperlipidemia and HTN as evidenced by maintaining goals listed below  Improve sleep duration and quality  through collaboration with PharmD and provider.   Interventions: 1:1 collaboration with LCarollee Ford YAlferd Apa DO regarding development and update of comprehensive plan of care as evidenced by provider attestation and co-signature Inter-disciplinary care team collaboration (see longitudinal plan of care) Comprehensive medication review performed; medication list updated in electronic medical record  Hypertension Controlled; BP goal <140/90 Current regimen:  Lisinopril 546mdaily in morning Patient mentioned at appointment with Pulmonologist that she was had an episode of dizziness when arising form seated position. He recommended she check blood pressure sitting and standing. She has not done this yet.  Interventions:  Check blood pressure weekly, document, and provide at future appointments Check blood pressure when seated and then again after standing for  1 minute. If systolic blood pressure (the top number in a blood pressure reading) drops by at least 20 mmHg during transition from lying down to standing up, or when diastolic blood pressure (the bottom reading) drops by at least 23mHg within three minutes after standing contact office.  Ensure daily salt intake < 2300 mg/day  Hyperlipidemia Controlled; LDL goal < 100 and Triglycerides <150 Current regimen:  Rosuvastatin 424mdaily (for LDL / 'bad'  cholesterol) Fenofibrate 16024mialy (for triglycerides) Coenzyne Q10 200m19mily Interventions:  Reviewed goals for triglycerides and LDL. Maintain current cholesterol medication regimen.   Diabetes Controlled; A1c goal <7%  Managed by Dr ElliLoanne Drillingast appt 07/28/2021. She has not been given new appointment or reassigned to new endocrinologist since Dr ElliLoanne Drillingt at end of May 2023 Recent home BG readings have been improving Checking every other day Ranging from 85 to 150 Denies hypoglycemia but has been having occasional episode of dizziness Patient was enrolled [employer]ication assistance thru 05/2022 Current regimen:  Acarbose 50mg75mee times a day with first bite of each meal Metformin XR 500 - take four tablets with morning meal Repaglinide 1mg -31mke 2 tablets 30 minutes before each meal (dose decreased 07/28/2021 by Dr EllisoLoanne Drillingmocriptine 2.5mg da34m Rybelsus 7mg - t86m daily 30 minutes before morning meal Interventions;  Recommended to check blood sugar daily, document, and provide at future appointments. ALso check blood glucose when you have dizziness to make sure not related to low blood glucose (< 70) Recommended she contact endocrinologist office to make appointment with new provider.   Continue to walk daily to achieve goal of 150 minutes per week of moderate intensity exercise  GERD (goal -  reduce symptoms of GERD) Controlled Denies breakthrough GERD symptoms except when she occasionally forgets to take omeprazole Current regimen:  Omeprazole 40mg dai42mnterventions: (addressed at previous visit) Maintain current medication regimen for acid relfux  Thyroid Health:  Lab Results  Component Value Date   TSH 3.66 02/03/2021  Dr Ellison sLoanne Drillingthyroid supplementation 11/17/20 Current regimen:  Levothyroxine 25mcg dai72m started 11/17/2020 Interventions: Patient advised to hold Biotin at least 1 week prior to any thyroid testing Due to have TSH  rechecked in August 2023.  Reminded patient to separate iron supplement from levothyroxine by at least 2 hours.   Insomnia:  Not controlled per patient; Goal is 7 to 9 hours of restful sleep  Current therapy:   Belsomnra 20mg at be39me as needed (Has not taken in last 2 days)  Previous therapy tried: melatonin - not effective, trazodone - not effective She has taken amitriptyline for several years for jaw pain. It used to help with sleep but not anymore.  Bedtime is 9:30 to 10pm; Usually sleeps for about 3 hours and then awakes at 1pm. She then is up about 2 or 3 hours. Back to sleep around 3am and awakes around 7 to 8 am. (Total sleep time is 7 to 8 hours)  No naps during day but she does usually lay down to rest for about 1 hours in afternoon.  Interventions:  Discussed sleep hygiene.  Reviewed options for sleep and if covered by her insurance. Will consult with PCP about options.  Dayvigo - similar to Belsomnra, may be slightly more effect but associated with increase in daytime somnolence. Not on her formulary and cost is about $300/month.  Ramelton - tier 4 $100 per month on her Part D; No the best choice for her since as  not been shown to significantly improved sleep maintenance.  Doxepin / Silenor - usually 3 to 6 mg recommended for sleep - these strengths are not on formulary (cost at [employer] is about $140/month); The higher strength doxepin is on formulary 68m and 248mare $47 per month (cost at [employer] about $12/month)  Medication management Current pharmacy: [facility] Interventions Comprehensive medication review performed. Continue current medication management strategy Reviewed medication refill history to assess adherence  Patient Goals/Self-Care Activities Over the next 90 days, patient will:  Take medications as prescribed,  Check blood sugar daily, document, and provide at future appointments. Also check blood glucose when you have dizziness to make sure not related to  low blood glucose (< 70 ). Contact either PCP or endocrinologist office if you are getting blood glucose < 70 Contact endocrinologist office to make appointment with new provider.   Continue to walk daily to achieve goal of 150 minutes per week of moderate intensity exercise Check blood pressure weekly, document, and provide at future appointments Check blood pressure when seated and then again after standing for 1 minute. If systolic blood pressure (the top number in a blood pressure reading) drops by at least 20 mmHg during transition from lying down to standing up, or when diastolic blood pressure (the bottom reading) drops by at least 1043m within three minutes after standing contact office. Collaborate with provider on medication access solutions. We will start reapplicaiton process for Rybelsus for 2024 at our appointment in November 2023. Due to have TSH rechecked in August 2023. Hold Biotin at least 1 week prior to any thyroid testing Separate iron supplement from levothyroxine by at least 2 hours.   Follow Up Plan: Telephone follow up appointment with care management team member scheduled for:  2 to 3 months          Medication Assistance: Rybelsus patient assistance program application for [id number] been approved thru 05/25/2022 - first shipment is in process. No current shipping date or tracking number.   [facility] [place] [id number]: [phone]: [phone] Follow Up:  Patient agrees to Care Plan and Follow-up.  Plan: The care management team will reach out to the patient again over the next 30 days.   TamCherre RobinsharmD Clinical Pharmacist [phone]

## 2021-12-28 ENCOUNTER — Other Ambulatory Visit: Payer: Self-pay

## 2021-12-28 DIAGNOSIS — E1142 Type 2 diabetes mellitus with diabetic polyneuropathy: Secondary | ICD-10-CM

## 2021-12-28 DIAGNOSIS — E039 Hypothyroidism, unspecified: Secondary | ICD-10-CM

## 2021-12-28 MED ORDER — ACARBOSE 50 MG PO TABS: ORAL_TABLET | ORAL | 3 refills | Status: DC

## 2021-12-28 MED ORDER — BROMOCRIPTINE MESYLATE 2.5 MG PO TABS: ORAL_TABLET | ORAL | 3 refills | Status: DC

## 2022-01-03 ENCOUNTER — Encounter: Payer: Self-pay | Admitting: Family Medicine

## 2022-01-03 ENCOUNTER — Ambulatory Visit (INDEPENDENT_AMBULATORY_CARE_PROVIDER_SITE_OTHER): Payer: Medicare Other | Admitting: Family Medicine

## 2022-01-03 VITALS — BP 114/51 | HR 75 | Temp 97.6°F | Ht 66.0 in | Wt 153.0 lb

## 2022-01-03 DIAGNOSIS — J029 Acute pharyngitis, unspecified: Secondary | ICD-10-CM | POA: Diagnosis not present

## 2022-01-03 LAB — POCT RAPID STREP A (OFFICE): Rapid Strep A Screen: NEGATIVE

## 2022-01-03 NOTE — Patient Instructions (Signed)
Strep test was negative.  Likely a viral infection. Continue supportive measures including rest, hydration, humidifier use, steam showers, salt water gargles, warm liquids with lemon and honey, and over-the-counter cough, cold, and analgesics as needed.    Please contact office for follow-up if symptoms do not improve or worsen. Seek emergency care if symptoms become severe.

## 2022-01-03 NOTE — Progress Notes (Signed)
   Acute Office Visit  Subjective:     Patient ID: Alyssa Ford, female    DOB: Oct 25, 1950, 71 y.o.   MRN: [id number]  CC: sore throat   HPI Patient is in today for sore throat.  Patient reports sore throat since Saturday (3 days ago). States it started with mild irritation and gradually worsened with this morning being the worst. She is having some hoarseness, ear popping, post-nasal drip, rhinorrhea, nasal congestion, occasional mild headache, sneezing. She has gotten minimal relief with Claritin and salt water gargles. States today is significantly better (5/10 now, compared to 10/10 this morning). She had a negative home COVID test yesterday. She denies any cough, fevers, chills, GI/GU symptoms, body aches.  She is going to be baby sitting granddaughter on Friday and wants to make sure she doesn't have anything she will give her.     ROS All review of systems negative except what is listed in the HPI      Objective:    BP (!) 114/51   Pulse 75   Temp 97.6 F (36.4 C)   Ht '5\' 6"'$  (1.676 m)   Wt 153 lb (69.4 kg)   BMI 24.69 kg/m    Physical Exam Vitals reviewed.  Constitutional:      Appearance: Normal appearance.  HENT:     Mouth/Throat:     Mouth: Mucous membranes are moist. No oral lesions.     Pharynx: Oropharynx is clear. No posterior oropharyngeal erythema.     Tonsils: No tonsillar exudate or tonsillar abscesses. 0 on the right. 0 on the left.  Cardiovascular:     Rate and Rhythm: Normal rate and regular rhythm.  Pulmonary:     Effort: Pulmonary effort is normal.     Breath sounds: Normal breath sounds.  Musculoskeletal:     Cervical back: Normal range of motion and neck supple. No tenderness.  Lymphadenopathy:     Cervical: No cervical adenopathy.  Skin:    General: Skin is warm and dry.  Neurological:     General: No focal deficit present.     Mental Status: She is alert and oriented to person, place, and time. Mental status is at baseline.   Psychiatric:        Mood and Affect: Mood normal.        Behavior: Behavior normal.        Thought Content: Thought content normal.        Judgment: Judgment normal.         Results for orders placed or performed in visit on 01/03/22  POCT rapid strep A  Result Value Ref Range   Rapid Strep A Screen Negative Negative        Assessment & Plan:   1. Sore throat Strep test was negative.  Likely a viral infection. Continue supportive measures including rest, hydration, humidifier use, steam showers, salt water gargles, warm liquids with lemon and honey, and over-the-counter cough, cold, and analgesics as needed.    Please contact office for follow-up if symptoms do not improve or worsen. Seek emergency care if symptoms become severe.   - POCT rapid strep A   Return if symptoms worsen or fail to improve.  Terrilyn Saver, NP

## 2022-01-04 DIAGNOSIS — H401131 Primary open-angle glaucoma, bilateral, mild stage: Secondary | ICD-10-CM | POA: Diagnosis not present

## 2022-01-04 DIAGNOSIS — Z961 Presence of intraocular lens: Secondary | ICD-10-CM | POA: Diagnosis not present

## 2022-01-24 ENCOUNTER — Ambulatory Visit: Payer: Medicare Other

## 2022-01-31 ENCOUNTER — Telehealth: Payer: Self-pay | Admitting: Family Medicine

## 2022-01-31 ENCOUNTER — Ambulatory Visit (INDEPENDENT_AMBULATORY_CARE_PROVIDER_SITE_OTHER): Payer: Medicare Other | Admitting: Family Medicine

## 2022-01-31 ENCOUNTER — Encounter: Payer: Self-pay | Admitting: Family Medicine

## 2022-01-31 VITALS — BP 130/80 | HR 71 | Temp 98.1°F | Resp 18 | Ht 66.0 in | Wt 151.6 lb

## 2022-01-31 DIAGNOSIS — I10 Essential (primary) hypertension: Secondary | ICD-10-CM | POA: Diagnosis not present

## 2022-01-31 DIAGNOSIS — G4700 Insomnia, unspecified: Secondary | ICD-10-CM

## 2022-01-31 DIAGNOSIS — E1169 Type 2 diabetes mellitus with other specified complication: Secondary | ICD-10-CM | POA: Diagnosis not present

## 2022-01-31 DIAGNOSIS — E039 Hypothyroidism, unspecified: Secondary | ICD-10-CM

## 2022-01-31 DIAGNOSIS — E1165 Type 2 diabetes mellitus with hyperglycemia: Secondary | ICD-10-CM | POA: Diagnosis not present

## 2022-01-31 DIAGNOSIS — E785 Hyperlipidemia, unspecified: Secondary | ICD-10-CM | POA: Diagnosis not present

## 2022-01-31 DIAGNOSIS — H9190 Unspecified hearing loss, unspecified ear: Secondary | ICD-10-CM | POA: Diagnosis not present

## 2022-01-31 DIAGNOSIS — Z0000 Encounter for general adult medical examination without abnormal findings: Secondary | ICD-10-CM | POA: Diagnosis not present

## 2022-01-31 LAB — COMPREHENSIVE METABOLIC PANEL
ALT: 29 U/L (ref 0–35)
AST: 25 U/L (ref 0–37)
Albumin: 4.6 g/dL (ref 3.5–5.2)
Alkaline Phosphatase: 42 U/L (ref 39–117)
BUN: 16 mg/dL (ref 6–23)
CO2: 29 mEq/L (ref 19–32)
Calcium: 10 mg/dL (ref 8.4–10.5)
Chloride: 99 mEq/L (ref 96–112)
Creatinine, Ser: 0.8 mg/dL (ref 0.40–1.20)
GFR: 73.9 mL/min (ref >60.00)
Glucose, Bld: 93 mg/dL (ref 70–99)
Potassium: 4.2 mEq/L (ref 3.5–5.1)
Sodium: 142 mEq/L (ref 135–145)
Total Bilirubin: 0.3 mg/dL (ref 0.2–1.2)
Total Protein: 7.1 g/dL (ref 6.0–8.3)

## 2022-01-31 LAB — CBC WITH DIFFERENTIAL/PLATELET
Basophils Absolute: 0 10*3/uL (ref 0.0–0.1)
Basophils Relative: 0.5 % (ref 0.0–3.0)
Eosinophils Absolute: 0.2 10*3/uL (ref 0.0–0.7)
Eosinophils Relative: 2.2 % (ref 0.0–5.0)
HCT: 37.9 % (ref 36.0–46.0)
Hemoglobin: 12.5 g/dL (ref 12.0–15.0)
Lymphocytes Relative: 35.8 % (ref 12.0–46.0)
Lymphs Abs: 2.7 10*3/uL (ref 0.7–4.0)
MCHC: 33.1 g/dL (ref 30.0–36.0)
MCV: 87.3 fl (ref 78.0–100.0)
Monocytes Absolute: 0.4 10*3/uL (ref 0.1–1.0)
Monocytes Relative: 5.8 % (ref 3.0–12.0)
Neutro Abs: 4.3 10*3/uL (ref 1.4–7.7)
Neutrophils Relative %: 55.7 % (ref 43.0–77.0)
Platelets: 244 10*3/uL (ref 150.0–400.0)
RBC: 4.34 Mil/uL (ref 3.87–5.11)
RDW: 14.4 % (ref 11.5–15.5)
WBC: 7.6 10*3/uL (ref 4.0–10.5)

## 2022-01-31 LAB — LIPID PANEL
Cholesterol: 139 mg/dL (ref 0–200)
HDL: 44.4 mg/dL (ref >39.00)
LDL Cholesterol: 72 mg/dL (ref 0–99)
NonHDL: 94.58
Total CHOL/HDL Ratio: 3
Triglycerides: 112 mg/dL (ref 0.0–149.0)
VLDL: 22.4 mg/dL (ref 0.0–40.0)

## 2022-01-31 LAB — TSH: TSH: 1.92 u[IU]/mL (ref 0.35–5.50)

## 2022-01-31 LAB — HEMOGLOBIN A1C: Hgb A1c MFr Bld: 6.8 % — ABNORMAL HIGH (ref 4.6–6.5)

## 2022-01-31 MED ORDER — QUVIVIQ 25 MG PO TABS: ORAL_TABLET | ORAL | 1 refills | Status: DC

## 2022-01-31 NOTE — Progress Notes (Signed)
Subjective:   By signing my name below, I, Shehryar Baig, attest that this documentation has been prepared under the direction and in the presence of Dr. Roma Schanz, DO. 01/31/2022    Patient ID: Alyssa Ford, female    DOB: Dec 17, 1950, 71 y.o.   MRN: [id number]  Chief Complaint  Patient presents with   Annual Exam    HPI Patient is in today for a comprehensive physical exam.   She complains of dizziness after standing up. Her episodes of dizziness last for at least 1 minute before she returns to normal. She has taken her blood pressure before and after standing up from being seated and notes that her seated blood pressure is higher than her standing blood pressure. She does not specifically track her water intake but is willing to make sure she is keeping up with her fluids. She denies any chest pain or shortness of breath.  BP Readings from Last 3 Encounters:  01/31/22 130/80  01/03/22 (!) 114/51  11/02/21 118/66   Pulse Readings from Last 3 Encounters:  01/31/22 71  01/03/22 75  11/02/21 82   She continues having difficulty sleeping. She has tried taking 20 mg belsomra to help her symptoms but found it was not effective after 2 weeks of taking it. She has stopped drinking caffeine past the morning.  She denies having any hearing difficulty but finds she has occasionally understanding words being spoken. She is interested in seeing a specialist to further evaluate her symptoms.  She continues checking her blood sugar regularly and reports they measure normal. She has an upcomming appointment with her new endocrinologist this year. She continues taking 7 mg rybelsus and reports having decreased appetite while taking it. She feels bad while taking it but is willing to continue taking it since it is effective in controlling her blood sugar.  Lab Results  Component Value Date   HGBA1C 6.8 (H) 01/31/2022   She denies having any fever, new moles, congestion, sore throat,  new muscle pain, new joint pain, chest pain, cough, SOB, wheezing, n/v/d, constipation, blood in stool, dysuria, frequency, hematuria, or headaches at this time. She has no changes to her family medical history. She has no recent surgical procedures to report. She does not smoke tobacco products.  She continues participating in regular exercise by walking 4x weekly.  She is due for a bone density scan and is interested in completing it at her next mammogram appointment.  She is UTD on dental care. She is UTD on vision care.    Past Medical History:  Diagnosis Date   Anemia    Anxiety    Arthritis    Cataract    Chronic headaches    Depression    Diabetes mellitus    Diverticulosis of colon (without mention of hemorrhage) 2003, 2008   Colonoscopy   GERD (gastroesophageal reflux disease)    Glaucoma    Hiatal hernia 2003   EGD    Hyperlipidemia    Hypertension    Thyroid nodule     Past Surgical History:  Procedure Laterality Date   CARPAL TUNNEL RELEASE  2006   CARPAL TUNNEL RELEASE  12/24/2007   CATARACT EXTRACTION, BILATERAL     [facility]---  sept and oct 2018   CERVICAL FUSION  3/08   CHOLECYSTECTOMY     FASCIECTOMY Left 08/01/2018   Procedure: FASCIECTOMY LEFT RING;  Surgeon: Daryll Brod, MD;  Location: [facility];  Service: Orthopedics;  Laterality: Left;  UPPER ARM   TRIGGER FINGER RELEASE Left 08/01/2018   Procedure: RELEASE TRIGGER FINGER/A-1 PULLEY [place];  Surgeon: Daryll Brod, MD;  Location: [facility];  Service: Orthopedics;  Laterality: Left;    Family History  Problem Relation Age of Onset   Dementia Mother    Breast cancer Mother 27   Kidney failure Mother 67       died from kidney failure from abx given to her for pneumonia   Hyperlipidemia Mother    Hypertension Mother    Diabetes Mother    Cancer Mother 80       breast   Parkinsonism Father    Diabetes Father 55   Hypertension Sister    Diabetes Brother    Hypertension  Brother    Heart attack Brother    Heart disease Cousin 73       mi   Colon cancer Neg Hx    Esophageal cancer Neg Hx    Stomach cancer Neg Hx     Social History   Socioeconomic History   Marital status: Married    Spouse name: Not on file   Number of children: Not on file   Years of education: Not on file   Highest education level: Not on file  Occupational History   Occupation: retired  Tobacco Use   Smoking status: Former    Packs/day: 1.40    Years: 49.00    Total pack years: 68.60    Types: Cigarettes    Quit date: 07/16/2015    Years since quitting: 6.5   Smokeless tobacco: Never   Tobacco comments:    Continues to vape but states pod do not contain nicotine  Vaping Use   Vaping Use: Every day  Substance and Sexual Activity   Alcohol use: Yes    Alcohol/week: 0.0 standard drinks of alcohol    Comment: once weekly; wine and beer   Drug use: No   Sexual activity: Not Currently    Partners: Male  Other Topics Concern   Not on file  Social History Narrative   Exercise--  walk   Social Determinants of Health   Financial Resource Strain: Medium Risk (12/23/2021)   Overall Financial Resource Strain (CARDIA)    Difficulty of Paying Living Expenses: Somewhat hard  Food Insecurity: No Food Insecurity (01/18/2021)   Hunger Vital Sign    Worried About Running Out of Food in the Last Year: Never true    Ran Out of Food in the Last Year: Never true  Transportation Needs: No Transportation Needs (01/18/2021)   PRAPARE - Hydrologist (Medical): No    Lack of Transportation (Non-Medical): No  Physical Activity: Insufficiently Active (12/23/2021)   Exercise Vital Sign    Days of Exercise per Week: 4 days    Minutes of Exercise per Session: 30 min  Stress: No Stress Concern Present (01/18/2021)   [place]    Feeling of Stress : Only a little  Social Connections: Moderately  Isolated (01/18/2021)   Social Connection and Isolation Panel [NHANES]    Frequency of Communication with Friends and Family: More than three times a week    Frequency of Social Gatherings with Friends and Family: More than three times a week    Attends Religious Services: Never    Marine scientist or Organizations: No    Attends Archivist Meetings: Never    Marital Status: Married  Intimate Partner Violence: Not At Risk (01/18/2021)   Humiliation, Afraid, Rape, and Kick questionnaire    Fear of Current or Ex-Partner: No    Emotionally Abused: No    Physically Abused: No    Sexually Abused: No    Outpatient Medications Prior to Visit  Medication Sig Dispense Refill   acarbose (PRECOSE) 50 MG tablet TAKE 1 TABLET(50 MG) BY MOUTH THREE TIMES DAILY WITH MEALS 270 tablet 3   amitriptyline (ELAVIL) 25 MG tablet TAKE 2 TABLETS(50 MG) BY MOUTH DAILY 180 tablet 1   aspirin EC 81 MG tablet Take 81 mg by mouth daily.     BIOTIN PO Take 1 tablet by mouth daily.      bromocriptine (PARLODEL) 2.5 MG tablet TAKE 1 TABLET(2.5 MG) BY MOUTH DAILY 90 tablet 3   Coenzyme Q10 (CO Q 10 PO) Take 1 capsule by mouth daily.     fenofibrate 160 MG tablet TAKE 1 TABLET(160 MG) BY MOUTH DAILY 90 tablet 1   Ferrous Sulfate (IRON SUPPLEMENT PO) Take 1 tablet by mouth daily. '65mg'$      ipratropium (ATROVENT) 0.06 % nasal spray Place 1 spray into both nostrils 2 (two) times daily as needed.     latanoprost (XALATAN) 0.005 % ophthalmic solution Place 1 drop into both eyes at bedtime.      levothyroxine (SYNTHROID) 25 MCG tablet TAKE 1 TABLET(25 MCG) BY MOUTH DAILY 90 tablet 3   lisinopril (ZESTRIL) 5 MG tablet TAKE 1 TABLET(5 MG) BY MOUTH DAILY 90 tablet 1   Melatonin 10 MG TABS Take 1 tablet by mouth at bedtime as needed (sleep).     metFORMIN (GLUCOPHAGE-XR) 500 MG 24 hr tablet TAKE 4 TABLETS(2000 MG) BY MOUTH DAILY WITH BREAKFAST 360 tablet 0   Naproxen Sodium (ALEVE PO) Take 2 tablets by mouth daily.       omeprazole (PRILOSEC) 40 MG capsule TAKE 1 CAPSULE(40 MG) BY MOUTH DAILY 90 capsule 1   Probiotic Product (PROBIOTIC-10 PO) Take 1 tablet by mouth daily.     repaglinide (PRANDIN) 1 MG tablet Take 1 tablet (1 mg total) by mouth 2 (two) times daily before a meal. 180 tablet 3   rosuvastatin (CRESTOR) 40 MG tablet TAKE 1 TABLET(40 MG) BY MOUTH AT BEDTIME (Patient taking differently: Take 40 mg by mouth every morning.) 90 tablet 1   Semaglutide (RYBELSUS) 7 MG TABS Take 7 mg by mouth daily. 90 tablet 3   Suvorexant (BELSOMRA) 20 MG TABS Take 20 mg by mouth at bedtime as needed. 30 tablet 2   No facility-administered medications prior to visit.    Allergies  Allergen Reactions   Codeine Nausea Only    Severe nausea    Review of Systems  Constitutional:  Negative for fever.  HENT:  Negative for congestion, sinus pain and sore throat.   Respiratory:  Negative for cough, shortness of breath and wheezing.   Cardiovascular:  Negative for chest pain and palpitations.  Gastrointestinal:  Negative for abdominal pain, constipation, diarrhea, nausea and vomiting.  Genitourinary:  Negative for dysuria, frequency and hematuria.  Musculoskeletal:        (-)new muscle pain (-)new joint pain  Skin:        (-)New moles  Neurological:  Negative for headaches.       Objective:    Physical Exam Constitutional:      General: She is not in acute distress.    Appearance: Normal appearance. She is not ill-appearing.  HENT:     Head:  Normocephalic and atraumatic.     Right Ear: Tympanic membrane, ear canal and external ear normal.     Left Ear: Tympanic membrane, ear canal and external ear normal.  Eyes:     Extraocular Movements: Extraocular movements intact.     Pupils: Pupils are equal, round, and reactive to light.  Cardiovascular:     Rate and Rhythm: Normal rate and regular rhythm.     Heart sounds: Normal heart sounds. No murmur heard.    No gallop.  Pulmonary:     Effort:  Pulmonary effort is normal. No respiratory distress.     Breath sounds: Normal breath sounds. No wheezing or rales.  Abdominal:     General: Bowel sounds are normal. There is no distension.     Palpations: Abdomen is soft.     Tenderness: There is no abdominal tenderness. There is no guarding.  Skin:    General: Skin is warm and dry.  Neurological:     Mental Status: She is alert and oriented to person, place, and time.  Psychiatric:        Judgment: Judgment normal.     BP 130/80 (BP Location: Left Arm, Patient Position: Sitting, Cuff Size: Normal)   Pulse 71   Temp 98.1 F (36.7 C) (Oral)   Resp 18   Ht '5\' 6"'$  (1.676 m)   Wt 151 lb 9.6 oz (68.8 kg)   SpO2 97%   BMI 24.47 kg/m  Wt Readings from Last 3 Encounters:  01/31/22 151 lb 9.6 oz (68.8 kg)  01/03/22 153 lb (69.4 kg)  11/02/21 152 lb (68.9 kg)    Diabetic Foot Exam - Simple   No data filed    Lab Results  Component Value Date   WBC 7.6 01/31/2022   HGB 12.5 01/31/2022   HCT 37.9 01/31/2022   PLT 244.0 01/31/2022   GLUCOSE 93 01/31/2022   CHOL 139 01/31/2022   TRIG 112.0 01/31/2022   HDL 44.40 01/31/2022   LDLDIRECT 87.0 06/07/2020   LDLCALC 72 01/31/2022   ALT 29 01/31/2022   AST 25 01/31/2022   NA 142 01/31/2022   K 4.2 01/31/2022   CL 99 01/31/2022   CREATININE 0.80 01/31/2022   BUN 16 01/31/2022   CO2 29 01/31/2022   TSH 1.92 01/31/2022   HGBA1C 6.8 (H) 01/31/2022   MICROALBUR <0.7 02/01/2022    Lab Results  Component Value Date   TSH 1.92 01/31/2022   Lab Results  Component Value Date   WBC 7.6 01/31/2022   HGB 12.5 01/31/2022   HCT 37.9 01/31/2022   MCV 87.3 01/31/2022   PLT 244.0 01/31/2022   Lab Results  Component Value Date   NA 142 01/31/2022   K 4.2 01/31/2022   CO2 29 01/31/2022   GLUCOSE 93 01/31/2022   BUN 16 01/31/2022   CREATININE 0.80 01/31/2022   BILITOT 0.3 01/31/2022   ALKPHOS 42 01/31/2022   AST 25 01/31/2022   ALT 29 01/31/2022   PROT 7.1 01/31/2022    ALBUMIN 4.6 01/31/2022   CALCIUM 10.0 01/31/2022   ANIONGAP 10 04/30/2021   GFR 73.90 01/31/2022   Lab Results  Component Value Date   CHOL 139 01/31/2022   Lab Results  Component Value Date   HDL 44.40 01/31/2022   Lab Results  Component Value Date   LDLCALC 72 01/31/2022   Lab Results  Component Value Date   TRIG 112.0 01/31/2022   Lab Results  Component Value Date   CHOLHDL 3 01/31/2022  Lab Results  Component Value Date   HGBA1C 6.8 (H) 01/31/2022   Mammogram: Last completed 11/11/2021. Results are normal. Repeat in 1 year.  Dexa: Last completed 08/05/2019. Assessment showed: The BMD measured at Forearm Radius 33% is 0.869 g/cm2 with a T-score of -0.2. This patient is considered normal according to [facility]) criteria. Compared with the prior study on 04/04/2017 the BMD of the total mean hip shows no statistically significant change. The scan quality is good. Lumbar spine was not utilized due to advanced degenerative changes. Repeat in 2 years.  Colonoscopy: Last completed 05/11/2021. Results showed: - One 18 mm polyp in the ascending colon, removed piecemeal using a cold snare. Resected and retrieved. - One 5 mm polyp in the descending colon, removed with a cold snare. Resected and retrieved. - Non-bleeding external and internal hemorrhoids. - Repeat colonoscopy in 3 years for surveillance based on pathology results. Pap smear: Last completed 05/16/2020. Results are normal.      Assessment & Plan:   Problem List Items Addressed This Visit       Unprioritized   Hyperlipidemia associated with type 2 diabetes mellitus ([place])   Insomnia   Relevant Medications   Daridorexant HCl (QUVIVIQ) 25 MG TABS   Uncontrolled type 2 diabetes mellitus with hyperglycemia ([place])    hgba1c to be checked , minimize simple carbs. Increase exercise as tolerated. Continue current meds       Preventative health care - Primary    ghm utd Check labs  See  avs       Relevant Orders   CBC with Differential/Platelet (Completed)   Comprehensive metabolic panel (Completed)   Hemoglobin A1c (Completed)   Lipid panel (Completed)   Microalbumin / creatinine urine ratio (Completed)   Hypothyroidism    Check labs       Relevant Orders   TSH (Completed)   Hyperlipidemia LDL goal <70    Encourage heart healthy diet such as MIND or DASH diet, increase exercise, avoid trans fats, simple carbohydrates and processed foods, consider a krill or fish or flaxseed oil cap daily.       Essential hypertension    Well controlled, no changes to meds. Encouraged heart healthy diet such as the DASH diet and exercise as tolerated.       Other Visit Diagnoses     Hearing loss, unspecified hearing loss type, unspecified laterality       Relevant Orders   Ambulatory referral to ENT        Meds ordered this encounter  Medications   Daridorexant HCl (QUVIVIQ) 25 MG TABS    Sig: 1 po qhs prn    Dispense:  30 tablet    Refill:  1    I, Ann Held, DO, personally preformed the services described in this documentation.  All medical record entries made by the scribe were at my direction and in my presence.  I have reviewed the chart and discharge instructions (if applicable) and agree that the record reflects my personal performance and is accurate and complete. 01/31/2022   I,Shehryar Baig,acting as a scribe for Ann Held, DO.,have documented all relevant documentation on the behalf of Ann Held, DO,as directed by  Ann Held, DO while in the presence of Ann Held, DO.   Ann Held, DO

## 2022-01-31 NOTE — Telephone Encounter (Signed)
Patient called to advise that her pharmacy told her that her prescription Daridorexant HCl (QUVIVIQ) 25 MG TABS [[id number]]  would be $500 and that it requires prior authorization. Please follow up with patient to advise.

## 2022-01-31 NOTE — Patient Instructions (Signed)
Preventive Care 65 Years and Older, Female Preventive care refers to lifestyle choices and visits with your health care provider that can promote health and wellness. Preventive care visits are also called wellness exams. What can I expect for my preventive care visit? Counseling Your health care provider may ask you questions about your: Medical history, including: Past medical problems. Family medical history. Pregnancy and menstrual history. History of falls. Current health, including: Memory and ability to understand (cognition). Emotional well-being. Home life and relationship well-being. Sexual activity and sexual health. Lifestyle, including: Alcohol, nicotine or tobacco, and drug use. Access to firearms. Diet, exercise, and sleep habits. Work and work environment. Sunscreen use. Safety issues such as seatbelt and bike helmet use. Physical exam Your health care provider will check your: Height and weight. These may be used to calculate your BMI (body mass index). BMI is a measurement that tells if you are at a healthy weight. Waist circumference. This measures the distance around your waistline. This measurement also tells if you are at a healthy weight and may help predict your risk of certain diseases, such as type 2 diabetes and high blood pressure. Heart rate and blood pressure. Body temperature. Skin for abnormal spots. What immunizations do I need?  Vaccines are usually given at various ages, according to a schedule. Your health care provider will recommend vaccines for you based on your age, medical history, and lifestyle or other factors, such as travel or where you work. What tests do I need? Screening Your health care provider may recommend screening tests for certain conditions. This may include: Lipid and cholesterol levels. Hepatitis C test. Hepatitis B test. HIV (human immunodeficiency virus) test. STI (sexually transmitted infection) testing, if you are at  risk. Lung cancer screening. Colorectal cancer screening. Diabetes screening. This is done by checking your blood sugar (glucose) after you have not eaten for a while (fasting). Mammogram. Talk with your health care provider about how often you should have regular mammograms. BRCA-related cancer screening. This may be done if you have a family history of breast, ovarian, tubal, or peritoneal cancers. Bone density scan. This is done to screen for osteoporosis. Talk with your health care provider about your test results, treatment options, and if necessary, the need for more tests. Follow these instructions at home: Eating and drinking  Eat a diet that includes fresh fruits and vegetables, whole grains, lean protein, and low-fat dairy products. Limit your intake of foods with high amounts of sugar, saturated fats, and salt. Take vitamin and mineral supplements as recommended by your health care provider. Do not drink alcohol if your health care provider tells you not to drink. If you drink alcohol: Limit how much you have to 0-1 drink a day. Know how much alcohol is in your drink. In the [place] one drink equals one 12 oz bottle of beer (355 mL), one 5 oz glass of wine (148 mL), or one 1 oz glass of hard liquor (44 mL). Lifestyle Brush your teeth every morning and night with fluoride toothpaste. Floss one time each day. Exercise for at least 30 minutes 5 or more days each week. Do not use any products that contain nicotine or tobacco. These products include cigarettes, chewing tobacco, and vaping devices, such as e-cigarettes. If you need help quitting, ask your health care provider. Do not use drugs. If you are sexually active, practice safe sex. Use a condom or other form of protection in order to prevent STIs. Take aspirin only as told by   your health care provider. Make sure that you understand how much to take and what form to take. Work with your health care provider to find out whether it  is safe and beneficial for you to take aspirin daily. Ask your health care provider if you need to take a cholesterol-lowering medicine (statin). Find healthy ways to manage stress, such as: Meditation, yoga, or listening to music. Journaling. Talking to a trusted person. Spending time with friends and family. Minimize exposure to UV radiation to reduce your risk of skin cancer. Safety Always wear your seat belt while driving or riding in a vehicle. Do not drive: If you have been drinking alcohol. Do not ride with someone who has been drinking. When you are tired or distracted. While texting. If you have been using any mind-altering substances or drugs. Wear a helmet and other protective equipment during sports activities. If you have firearms in your house, make sure you follow all gun safety procedures. What's next? Visit your health care provider once a year for an annual wellness visit. Ask your health care provider how often you should have your eyes and teeth checked. Stay up to date on all vaccines. This information is not intended to replace advice given to you by your health care provider. Make sure you discuss any questions you have with your health care provider. Document Revised: 12/08/2020 Document Reviewed: 12/08/2020 Elsevier Patient Education  2023 [employer]  

## 2022-02-01 ENCOUNTER — Other Ambulatory Visit (INDEPENDENT_AMBULATORY_CARE_PROVIDER_SITE_OTHER): Payer: Medicare Other

## 2022-02-01 DIAGNOSIS — Z0000 Encounter for general adult medical examination without abnormal findings: Secondary | ICD-10-CM | POA: Diagnosis not present

## 2022-02-01 LAB — MICROALBUMIN / CREATININE URINE RATIO
Creatinine,U: 52.6 mg/dL
Microalb Creat Ratio: 1.3 mg/g (ref 0.0–30.0)
Microalb, Ur: <0.7 mg/dL (ref 0.0–1.9)

## 2022-02-05 DIAGNOSIS — Z0000 Encounter for general adult medical examination without abnormal findings: Secondary | ICD-10-CM | POA: Insufficient documentation

## 2022-02-05 NOTE — Assessment & Plan Note (Signed)
ghm utd Check labs  See avs  

## 2022-02-05 NOTE — Assessment & Plan Note (Signed)
Well controlled, no changes to meds. Encouraged heart healthy diet such as the DASH diet and exercise as tolerated.  °

## 2022-02-05 NOTE — Assessment & Plan Note (Signed)
Encourage heart healthy diet such as MIND or DASH diet, increase exercise, avoid trans fats, simple carbohydrates and processed foods, consider a krill or fish or flaxseed oil cap daily.  °

## 2022-02-05 NOTE — Assessment & Plan Note (Signed)
hgba1c to be checked, minimize simple carbs. Increase exercise as tolerated. Continue current meds  

## 2022-02-05 NOTE — Assessment & Plan Note (Signed)
Check labs 

## 2022-02-06 ENCOUNTER — Ambulatory Visit (INDEPENDENT_AMBULATORY_CARE_PROVIDER_SITE_OTHER): Payer: Medicare Other

## 2022-02-06 ENCOUNTER — Telehealth: Payer: Self-pay

## 2022-02-06 DIAGNOSIS — Z0000 Encounter for general adult medical examination without abnormal findings: Secondary | ICD-10-CM | POA: Diagnosis not present

## 2022-02-06 NOTE — Telephone Encounter (Signed)
PA sent

## 2022-02-06 NOTE — Patient Instructions (Signed)
Alyssa Ford , Thank you for taking time to come for your Medicare Wellness Visit. I appreciate your ongoing commitment to your health goals. Please review the following plan we discussed and let me know if I can assist you in the future.   Screening recommendations/referrals: Colonoscopy: 05/11/21 due 05/11/24 Mammogram: 11/11/21 due 11/12/22 Bone Density: declined Recommended yearly ophthalmology/optometry visit for glaucoma screening and checkup Recommended yearly dental visit for hygiene and checkup  Vaccinations: Influenza vaccine: up to date Pneumococcal vaccine: up to date Tdap vaccine: up to date Shingles vaccine: up to date   Covid-19:completed  Advanced directives: no  Conditions/risks identified: see problem list  Next appointment: Follow up in one year for your annual wellness visit 02/08/23   Preventive Care 70 Years and Older, Female Preventive care refers to lifestyle choices and visits with your health care provider that can promote health and wellness. What does preventive care include? A yearly physical exam. This is also called an annual well check. Dental exams once or twice a year. Routine eye exams. Ask your health care provider how often you should have your eyes checked. Personal lifestyle choices, including: Daily care of your teeth and gums. Regular physical activity. Eating a healthy diet. Avoiding tobacco and drug use. Limiting alcohol use. Practicing safe sex. Taking low-dose aspirin every day. Taking vitamin and mineral supplements as recommended by your health care provider. What happens during an annual well check? The services and screenings done by your health care provider during your annual well check will depend on your age, overall health, lifestyle risk factors, and family history of disease. Counseling  Your health care provider may ask you questions about your: Alcohol use. Tobacco use. Drug use. Emotional well-being. Home and  relationship well-being. Sexual activity. Eating habits. History of falls. Memory and ability to understand (cognition). Work and work Statistician. Reproductive health. Screening  You may have the following tests or measurements: Height, weight, and BMI. Blood pressure. Lipid and cholesterol levels. These may be checked every 5 years, or more frequently if you are over 99 years old. Skin check. Lung cancer screening. You may have this screening every year starting at age 17 if you have a 30-pack-year history of smoking and currently smoke or have quit within the past 15 years. Fecal occult blood test (FOBT) of the stool. You may have this test every year starting at age 37. Flexible sigmoidoscopy or colonoscopy. You may have a sigmoidoscopy every 5 years or a colonoscopy every 10 years starting at age 68. Hepatitis C blood test. Hepatitis B blood test. Sexually transmitted disease (STD) testing. Diabetes screening. This is done by checking your blood sugar (glucose) after you have not eaten for a while (fasting). You may have this done every 1-3 years. Bone density scan. This is done to screen for osteoporosis. You may have this done starting at age 67. Mammogram. This may be done every 1-2 years. Talk to your health care provider about how often you should have regular mammograms. Talk with your health care provider about your test results, treatment options, and if necessary, the need for more tests. Vaccines  Your health care provider may recommend certain vaccines, such as: Influenza vaccine. This is recommended every year. Tetanus, diphtheria, and acellular pertussis (Tdap, Td) vaccine. You may need a Td booster every 10 years. Zoster vaccine. You may need this after age 36. Pneumococcal 13-valent conjugate (PCV13) vaccine. One dose is recommended after age 69. Pneumococcal polysaccharide (PPSV23) vaccine. One dose is recommended after  age 34. Talk to your health care provider  about which screenings and vaccines you need and how often you need them. This information is not intended to replace advice given to you by your health care provider. Make sure you discuss any questions you have with your health care provider. Document Released: 07/09/2015 Document Revised: 03/01/2016 Document Reviewed: 04/13/2015 Elsevier Interactive Patient Education  2017 [place] Prevention in the Home Falls can cause injuries. They can happen to people of all ages. There are many things you can do to make your home safe and to help prevent falls. What can I do on the outside of my home? Regularly fix the edges of walkways and driveways and fix any cracks. Remove anything that might make you trip as you walk through a door, such as a raised step or threshold. Trim any bushes or trees on the path to your home. Use bright outdoor lighting. Clear any walking paths of anything that might make someone trip, such as rocks or tools. Regularly check to see if handrails are loose or broken. Make sure that both sides of any steps have handrails. Any raised decks and porches should have guardrails on the edges. Have any leaves, snow, or ice cleared regularly. Use sand or salt on walking paths during winter. Clean up any spills in your garage right away. This includes oil or grease spills. What can I do in the bathroom? Use night lights. Install grab bars by the toilet and in the tub and shower. Do not use towel bars as grab bars. Use non-skid mats or decals in the tub or shower. If you need to sit down in the shower, use a plastic, non-slip stool. Keep the floor dry. Clean up any water that spills on the floor as soon as it happens. Remove soap buildup in the tub or shower regularly. Attach bath mats securely with double-sided non-slip rug tape. Do not have throw rugs and other things on the floor that can make you trip. What can I do in the bedroom? Use night lights. Make sure  that you have a light by your bed that is easy to reach. Do not use any sheets or blankets that are too big for your bed. They should not hang down onto the floor. Have a firm chair that has side arms. You can use this for support while you get dressed. Do not have throw rugs and other things on the floor that can make you trip. What can I do in the kitchen? Clean up any spills right away. Avoid walking on wet floors. Keep items that you use a lot in easy-to-reach places. If you need to reach something above you, use a strong step stool that has a grab bar. Keep electrical cords out of the way. Do not use floor polish or wax that makes floors slippery. If you must use wax, use non-skid floor wax. Do not have throw rugs and other things on the floor that can make you trip. What can I do with my stairs? Do not leave any items on the stairs. Make sure that there are handrails on both sides of the stairs and use them. Fix handrails that are broken or loose. Make sure that handrails are as long as the stairways. Check any carpeting to make sure that it is firmly attached to the stairs. Fix any carpet that is loose or worn. Avoid having throw rugs at the top or bottom of the stairs. If you do  have throw rugs, attach them to the floor with carpet tape. Make sure that you have a light switch at the top of the stairs and the bottom of the stairs. If you do not have them, ask someone to add them for you. What else can I do to help prevent falls? Wear shoes that: Do not have high heels. Have rubber bottoms. Are comfortable and fit you well. Are closed at the toe. Do not wear sandals. If you use a stepladder: Make sure that it is fully opened. Do not climb a closed stepladder. Make sure that both sides of the stepladder are locked into place. Ask someone to hold it for you, if possible. Clearly mark and make sure that you can see: Any grab bars or handrails. First and last steps. Where the edge of  each step is. Use tools that help you move around (mobility aids) if they are needed. These include: Canes. Walkers. Scooters. Crutches. Turn on the lights when you go into a dark area. Replace any light bulbs as soon as they burn out. Set up your furniture so you have a clear path. Avoid moving your furniture around. If any of your floors are uneven, fix them. If there are any pets around you, be aware of where they are. Review your medicines with your doctor. Some medicines can make you feel dizzy. This can increase your chance of falling. Ask your doctor what other things that you can do to help prevent falls. This information is not intended to replace advice given to you by your health care provider. Make sure you discuss any questions you have with your health care provider. Document Released: 04/08/2009 Document Revised: 11/18/2015 Document Reviewed: 07/17/2014 Elsevier Interactive Patient Education  2017 [employer].

## 2022-02-06 NOTE — Progress Notes (Signed)
Subjective:   Alyssa Ford is a 71 y.o. female who presents for an Initial Medicare Annual Wellness Visit.  I connected with  Alyssa Ford on 02/06/22 by a audio enabled telemedicine application and verified that I am speaking with the correct person using two identifiers.  Patient Location: Home  Provider Location: Office/Clinic  I discussed the limitations of evaluation and management by telemedicine. The patient expressed understanding and agreed to proceed.   Review of Systems     Cardiac Risk Factors include: advanced age (>72mn, >>10women);diabetes mellitus;hypertension;dyslipidemia     Objective:    There were no vitals filed for this visit. There is no height or weight on file to calculate BMI.     02/06/2022    1:06 PM 05/11/2021    7:34 AM 04/30/2021    6:19 PM 01/18/2021    9:01 AM 08/04/2020    9:26 AM 01/06/2020    2:37 PM 02/21/2019   12:38 PM  Advanced Directives  Does Patient Have a Medical Advance Directive? No No No No No No No  Would patient like information on creating a medical advance directive? No - Patient declined No - Patient declined  Yes (MAU/Ambulatory/Procedural Areas - Information given)  Yes (MAU/Ambulatory/Procedural Areas - Information given)     Current Medications (verified) Outpatient Encounter Medications as of 02/06/2022  Medication Sig   acarbose (PRECOSE) 50 MG tablet TAKE 1 TABLET(50 MG) BY MOUTH THREE TIMES DAILY WITH MEALS   amitriptyline (ELAVIL) 25 MG tablet TAKE 2 TABLETS(50 MG) BY MOUTH DAILY   aspirin EC 81 MG tablet Take 81 mg by mouth daily.   BIOTIN PO Take 1 tablet by mouth daily.    bromocriptine (PARLODEL) 2.5 MG tablet TAKE 1 TABLET(2.5 MG) BY MOUTH DAILY   Coenzyme Q10 (CO Q 10 PO) Take 1 capsule by mouth daily.   Daridorexant HCl (QUVIVIQ) 25 MG TABS 1 po qhs prn   fenofibrate 160 MG tablet TAKE 1 TABLET(160 MG) BY MOUTH DAILY   Ferrous Sulfate (IRON SUPPLEMENT PO) Take 1 tablet by mouth daily. '65mg'$     ipratropium (ATROVENT) 0.06 % nasal spray Place 1 spray into both nostrils 2 (two) times daily as needed.   latanoprost (XALATAN) 0.005 % ophthalmic solution Place 1 drop into both eyes at bedtime.    levothyroxine (SYNTHROID) 25 MCG tablet TAKE 1 TABLET(25 MCG) BY MOUTH DAILY   lisinopril (ZESTRIL) 5 MG tablet TAKE 1 TABLET(5 MG) BY MOUTH DAILY   Melatonin 10 MG TABS Take 1 tablet by mouth at bedtime as needed (sleep).   metFORMIN (GLUCOPHAGE-XR) 500 MG 24 hr tablet TAKE 4 TABLETS(2000 MG) BY MOUTH DAILY WITH BREAKFAST   Naproxen Sodium (ALEVE PO) Take 2 tablets by mouth daily.    omeprazole (PRILOSEC) 40 MG capsule TAKE 1 CAPSULE(40 MG) BY MOUTH DAILY   Probiotic Product (PROBIOTIC-10 PO) Take 1 tablet by mouth daily.   repaglinide (PRANDIN) 1 MG tablet Take 1 tablet (1 mg total) by mouth 2 (two) times daily before a meal.   rosuvastatin (CRESTOR) 40 MG tablet TAKE 1 TABLET(40 MG) BY MOUTH AT BEDTIME (Patient taking differently: Take 40 mg by mouth every morning.)   Semaglutide (RYBELSUS) 7 MG TABS Take 7 mg by mouth daily.   No facility-administered encounter medications on file as of 02/06/2022.    Allergies (verified) Codeine   History: Past Medical History:  Diagnosis Date   Anemia    Anxiety    Arthritis    Cataract  Chronic headaches    Depression    Diabetes mellitus    Diverticulosis of colon (without mention of hemorrhage) 2003, 2008   Colonoscopy   GERD (gastroesophageal reflux disease)    Glaucoma    Hiatal hernia 2003   EGD    Hyperlipidemia    Hypertension    Thyroid nodule    Past Surgical History:  Procedure Laterality Date   CARPAL TUNNEL RELEASE  2006   CARPAL TUNNEL RELEASE  12/24/2007   CATARACT EXTRACTION, BILATERAL     [facility]---  sept and oct 2018   CERVICAL FUSION  3/08   CHOLECYSTECTOMY     FASCIECTOMY Left 08/01/2018   Procedure: FASCIECTOMY LEFT RING;  Surgeon: Daryll Brod, MD;  Location: [facility];  Service: Orthopedics;   Laterality: Left;  UPPER ARM   TRIGGER FINGER RELEASE Left 08/01/2018   Procedure: RELEASE TRIGGER FINGER/A-1 PULLEY [place];  Surgeon: Daryll Brod, MD;  Location: [place];  Service: Orthopedics;  Laterality: Left;   Family History  Problem Relation Age of Onset   Dementia Mother    Breast cancer Mother 60   Kidney failure Mother 64       died from kidney failure from abx given to her for pneumonia   Hyperlipidemia Mother    Hypertension Mother    Diabetes Mother    Cancer Mother 66       breast   Parkinsonism Father    Diabetes Father 71   Hypertension Sister    Diabetes Brother    Hypertension Brother    Heart attack Brother    Heart disease Cousin 97       mi   Colon cancer Neg Hx    Esophageal cancer Neg Hx    Stomach cancer Neg Hx    Social History   Socioeconomic History   Marital status: Married    Spouse name: Not on file   Number of children: Not on file   Years of education: Not on file   Highest education level: Not on file  Occupational History   Occupation: retired  Tobacco Use   Smoking status: Former    Packs/day: 1.40    Years: 49.00    Total pack years: 68.60    Types: Cigarettes    Quit date: 07/16/2015    Years since quitting: 6.5   Smokeless tobacco: Never   Tobacco comments:    Continues to vape but states pod do not contain nicotine  Vaping Use   Vaping Use: Every day  Substance and Sexual Activity   Alcohol use: Yes    Alcohol/week: 0.0 standard drinks of alcohol    Comment: once weekly; wine and beer   Drug use: No   Sexual activity: Not Currently    Partners: Male  Other Topics Concern   Not on file  Social History Narrative   Exercise--  walk   Social Determinants of Health   Financial Resource Strain: Medium Risk (12/23/2021)   Overall Financial Resource Strain (CARDIA)    Difficulty of Paying Living Expenses: Somewhat hard  Food Insecurity: No Food Insecurity (01/18/2021)   Hunger Vital Sign    Worried  About Running Out of Food in the Last Year: Never true    Ran Out of Food in the Last Year: Never true  Transportation Needs: No Transportation Needs (01/18/2021)   PRAPARE - Hydrologist (Medical): No    Lack of Transportation (Non-Medical): No  Physical Activity: Insufficiently  Active (12/23/2021)   Exercise Vital Sign    Days of Exercise per Week: 4 days    Minutes of Exercise per Session: 30 min  Stress: No Stress Concern Present (01/18/2021)   [place]    Feeling of Stress : Only a little  Social Connections: Moderately Isolated (01/18/2021)   Social Connection and Isolation Panel [NHANES]    Frequency of Communication with Friends and Family: More than three times a week    Frequency of Social Gatherings with Friends and Family: More than three times a week    Attends Religious Services: Never    Marine scientist or Organizations: No    Attends Music therapist: Never    Marital Status: Married    Tobacco Counseling Counseling given: Not Answered Tobacco comments: Continues to vape but states pod do not contain nicotine   Clinical Intake:  Pre-visit preparation completed: Yes  Pain : No/denies pain     BMI - recorded: 24.47 Nutritional Status: BMI of 19-24  Normal Nutritional Risks: None Diabetes: Yes CBG done?: No Did pt. bring in CBG monitor from home?: No  How often do you need to have someone help you when you read instructions, pamphlets, or other written materials from your doctor or pharmacy?: 1 - Never  Diabetic?yes Nutrition Risk Assessment:  Has the patient had any N/V/D within the last 2 months?  No  Does the patient have any non-healing wounds?  No  Has the patient had any unintentional weight loss or weight gain?  No   Diabetes:  Is the patient diabetic?  Yes  If diabetic, was a CBG obtained today?  No  Did the patient bring in their  glucometer from home?  No  How often do you monitor your CBG's? Once daily.   Financial Strains and Diabetes Management:  Are you having any financial strains with the device, your supplies or your medication? No .  Does the patient want to be seen by Chronic Care Management for management of their diabetes?  No  Would the patient like to be referred to a Nutritionist or for Diabetic Management?  No   Diabetic Exams:  Diabetic Eye Exam: Overdue for diabetic eye exam. Pt has been advised about the importance in completing this exam. Patient advised to call and schedule an eye exam. Diabetic Foot Exam: Completed 03/22/21    Interpreter Needed?: No  Information entered by :: [place] [facility] Daily Living    02/06/2022    1:11 PM  In your present state of health, do you have any difficulty performing the following activities:  Hearing? 0  Vision? 0  Difficulty concentrating or making decisions? 0  Walking or climbing stairs? 0  Dressing or bathing? 0  Doing errands, shopping? 0  Preparing Food and eating ? N  Using the Toilet? N  In the past six months, have you accidently leaked urine? N  Do you have problems with loss of bowel control? N  Managing your Medications? N  Managing your Finances? N  Housekeeping or managing your Housekeeping? N    Patient Care Team: Carollee Herter, Alferd Apa, DO as PCP - General Calvert Cantor, MD as Consulting Physician (Ophthalmology) Levy Sjogren, MD as Referring Physician (Dermatology) Ilda Foil Sol Blazing, MD as Referring Physician (Pediatrics) Renato Shin, MD (Inactive) as Consulting Physician (Endocrinology) Cherre Robins, [place] (Pharmacist)  Indicate any recent Medical Services you may have received from  other than [facility] providers in the past year (date may be approximate).     Assessment:   This is a routine wellness examination for Mckenzy.  Hearing/Vision screen No results found.  Dietary issues  and exercise activities discussed: Current Exercise Habits: Home exercise routine, Type of exercise: walking, Time (Minutes): 30, Frequency (Times/Week): 4, Weekly Exercise (Minutes/Week): 120, Intensity: Mild, Exercise limited by: None identified   Goals Addressed             This Visit's Progress    Begin walking at least 3x/week   On track      Depression Screen    02/06/2022    1:07 PM 01/31/2022    1:16 PM 01/18/2021    9:04 AM 01/06/2020    2:48 PM 03/05/2019    3:43 PM 05/29/2017   10:49 AM 12/14/2016    9:18 AM  PHQ 2/9 Scores  PHQ - 2 Score 0 0 0 0 0 0 0  PHQ- 9 Score     0 0     Fall Risk    02/06/2022    1:07 PM 01/31/2022    1:15 PM 01/18/2021    9:03 AM 01/06/2020    2:48 PM 03/05/2019    3:43 PM  [place] in the past year? 0 0 0 1 1  Number falls in past yr: 0 0 0 0 0  Injury with Fall? 0 0 0 1 1  Risk for fall due to : No Fall Risks      Follow up Falls evaluation completed Falls evaluation completed Falls prevention discussed Education provided;Falls prevention discussed     FALL RISK PREVENTION PERTAINING TO THE HOME:  Any stairs in or around the home? No  If so, are there any without handrails? No  Home free of loose throw rugs in walkways, pet beds, electrical cords, etc? Yes  Adequate lighting in your home to reduce risk of falls? Yes   ASSISTIVE DEVICES UTILIZED TO PREVENT FALLS:  Life alert? No  Use of a cane, walker or w/c? No  Grab bars in the bathroom? Yes  Shower chair or bench in shower? No  Elevated toilet seat or a handicapped toilet? Yes   TIMED UP AND GO:  Was the test performed? No .    Cognitive Function:    12/14/2016    9:20 AM  MMSE - Mini Mental State Exam  Orientation to time 5  Orientation to Place 5  Registration 3  Attention/ Calculation 5  Recall 3  Language- name 2 objects 2  Language- repeat 1  Language- follow 3 step command 3  Language- read & follow direction 1  Write a sentence 1  Copy design 1   Total score 30        02/06/2022    1:15 PM 01/18/2021    9:13 AM 01/06/2020    2:46 PM  6CIT Screen  What Year? 0 points 0 points 0 points  What month? 0 points 0 points 0 points  What time? 0 points 0 points 0 points  Count back from 20 0 points 0 points 0 points  Months in reverse 0 points 0 points 0 points  Repeat phrase 2 points 0 points   Total Score 2 points 0 points     Immunizations Immunization History  Administered Date(s) Administered   Fluad Quad(high Dose 65+) 04/08/2019, 04/08/2020   Influenza Split 04/13/2011   Influenza Whole 04/17/2006   Influenza, High Dose Seasonal PF  04/12/2016, 05/07/2017, 04/08/2018   Influenza, Seasonal, Injecte, Preservative Fre 05/22/2012   Influenza,inj,Quad PF,6+ Mos 03/03/2014, 03/18/2015   Influenza-Unspecified 05/07/2017, 05/02/2021   PFIZER(Purple Top)SARS-COV-2 Vaccination 07/21/2019, 08/11/2019, 04/27/2020, 11/23/2020   Pfizer Covid-19 Vaccine Bivalent Booster 44yr & up 05/03/2021   Pneumococcal Conjugate-13 09/13/2015, 05/07/2017, 04/15/2019   Pneumococcal Polysaccharide-23 03/31/2003, 11/03/2008, 05/07/2017   Td 10/24/1999, 02/23/2010   Tdap 02/21/2019   Zoster Recombinat (Shingrix) 03/09/2017, 06/23/2017   Zoster, Live 12/25/2011    TDAP status: Up to date  Flu Vaccine status: Up to date  Pneumococcal vaccine status: Up to date  Covid-19 vaccine status: Completed vaccines  Qualifies for Shingles Vaccine? Yes   Zostavax completed No   Shingrix Completed?: Yes  Screening Tests Health Maintenance  Topic Date Due   OPHTHALMOLOGY EXAM  04/08/2020   COVID-19 Vaccine (6 - Pfizer risk series) 06/28/2021   DEXA SCAN  08/04/2021   INFLUENZA VACCINE  01/24/2022   FOOT EXAM  03/22/2022   HEMOGLOBIN A1C  08/03/2022   MAMMOGRAM  11/12/2022   COLONOSCOPY (Pts 45-463yrInsurance coverage will need to be confirmed)  05/11/2024   TETANUS/TDAP  02/20/2029   Pneumonia Vaccine 6532Years old  Completed   Hepatitis C  Screening  Completed   Zoster Vaccines- Shingrix  Completed   HPV VACCINES  Aged Out    Health Maintenance  Health Maintenance Due  Topic Date Due   OPHTHALMOLOGY EXAM  04/08/2020   COVID-19 Vaccine (6 - Pfizer risk series) 06/28/2021   DEXA SCAN  08/04/2021   INFLUENZA VACCINE  01/24/2022    Colorectal cancer screening: Type of screening: Colonoscopy. Completed 05/11/21. Repeat every 3 years  Mammogram status: Completed 11/11/21. Repeat every year  Bone Density status: Ordered declined. Pt provided with contact info and advised to call to schedule appt.  Lung Cancer Screening: (Low Dose CT Chest recommended if Age 848-80ears, 30 pack-year currently smoking OR have quit w/in 15years.) does qualify.   Lung Cancer Screening Referral: lat done 11/11/21  Additional Screening:  Hepatitis C Screening: does qualify; Completed 04/27/15  Vision Screening: Recommended annual ophthalmology exams for early detection of glaucoma and other disorders of the eye. Is the patient up to date with their annual eye exam?  Yes  Who is the provider or what is the name of the office in which the patient attends annual eye exams? Dr.Glenn If pt is not established with a provider, would they like to be referred to a provider to establish care? No .   Dental Screening: Recommended annual dental exams for proper oral hygiene  Community Resource Referral / Chronic Care Management: CRR required this visit?  No   CCM required this visit?  No      Plan:     I have personally reviewed and noted the following in the patient's chart:   Medical and social history Use of alcohol, tobacco or illicit drugs  Current medications and supplements including opioid prescriptions. Patient is not currently taking opioid prescriptions. Functional ability and status Nutritional status Physical activity Advanced directives List of other physicians Hospitalizations, surgeries, and ER visits in previous 12  months Vitals Screenings to include cognitive, depression, and falls Referrals and appointments  In addition, I have reviewed and discussed with patient certain preventive protocols, quality metrics, and best practice recommendations. A written personalized care plan for preventive services as well as general preventive health recommendations were provided to patient.   Due to this being a telephonic visit, the after visit summary with patients  personalized plan was offered to patient via mail or my-chart.  Patient would like to access on my-chart.  Duard Brady Greer [place], [place]   02/06/2022   Nurse Notes: none

## 2022-02-06 NOTE — Telephone Encounter (Signed)
PA sent  Key: [id number]

## 2022-02-07 NOTE — Telephone Encounter (Signed)
Approvedon August 14 Request Reference Number: [id number]. QUVIVIQ TAB '25MG'$  is approved through 06/25/2022. Your patient may now fill this prescription and it will be covered.

## 2022-02-20 ENCOUNTER — Telehealth: Payer: Self-pay

## 2022-02-20 DIAGNOSIS — H9012 Conductive hearing loss, unilateral, left ear, with unrestricted hearing on the contralateral side: Secondary | ICD-10-CM | POA: Diagnosis not present

## 2022-02-20 DIAGNOSIS — E041 Nontoxic single thyroid nodule: Secondary | ICD-10-CM | POA: Diagnosis not present

## 2022-02-20 DIAGNOSIS — H9042 Sensorineural hearing loss, unilateral, left ear, with unrestricted hearing on the contralateral side: Secondary | ICD-10-CM | POA: Diagnosis not present

## 2022-02-20 NOTE — Telephone Encounter (Signed)
Rybelsus arrived. Pt made aware. Placed at the front. Pt will pick up tomorrow.

## 2022-02-23 ENCOUNTER — Telehealth: Payer: Self-pay | Admitting: Family Medicine

## 2022-02-23 ENCOUNTER — Other Ambulatory Visit: Payer: Self-pay | Admitting: Family Medicine

## 2022-02-23 MED ORDER — QUVIVIQ 50 MG PO TABS
ORAL_TABLET | ORAL | 1 refills | Status: DC
Start: 2022-02-23 — End: 2022-07-14

## 2022-02-23 NOTE — Telephone Encounter (Signed)
Please advise 

## 2022-02-23 NOTE — Telephone Encounter (Signed)
Patient states she is still having trouble sleeping. Dr Etter Sjogren stated that she could possibly move up on sleeping meds  Please advise   Daridorexant HCl (QUVIVIQ) 25 MG TABS [[id number]]

## 2022-02-24 NOTE — Telephone Encounter (Signed)
Pt made aware

## 2022-03-02 ENCOUNTER — Telehealth: Payer: Self-pay

## 2022-03-02 NOTE — Telephone Encounter (Signed)
Arrival of medication from [facility].  Medication: Rybelsus '7mg'$ , #30 per bottle Quantity: 4 bottles NDC: 9396-8864-84 Lot #: [id number] Exp: 07/27/2023  Medication left up front for patient pick up. Patient made aware.

## 2022-03-07 ENCOUNTER — Other Ambulatory Visit: Payer: Self-pay | Admitting: Family Medicine

## 2022-03-07 ENCOUNTER — Other Ambulatory Visit: Payer: Self-pay | Admitting: Endocrinology

## 2022-03-07 DIAGNOSIS — E1169 Type 2 diabetes mellitus with other specified complication: Secondary | ICD-10-CM

## 2022-03-07 DIAGNOSIS — E118 Type 2 diabetes mellitus with unspecified complications: Secondary | ICD-10-CM

## 2022-03-07 DIAGNOSIS — E1142 Type 2 diabetes mellitus with diabetic polyneuropathy: Secondary | ICD-10-CM

## 2022-03-20 ENCOUNTER — Other Ambulatory Visit: Payer: Medicare Other

## 2022-03-23 ENCOUNTER — Encounter: Payer: Self-pay | Admitting: Endocrinology

## 2022-03-23 ENCOUNTER — Ambulatory Visit: Payer: Medicare Other | Admitting: Endocrinology

## 2022-03-23 VITALS — BP 132/70 | HR 70 | Ht 66.0 in | Wt 154.0 lb

## 2022-03-23 DIAGNOSIS — E1165 Type 2 diabetes mellitus with hyperglycemia: Secondary | ICD-10-CM | POA: Diagnosis not present

## 2022-03-23 DIAGNOSIS — E038 Other specified hypothyroidism: Secondary | ICD-10-CM

## 2022-03-23 NOTE — Patient Instructions (Addendum)
Check blood sugars on waking up 1-2  days a week  Also check blood sugars about 2 hours after meals and do this after different meals by rotation  Recommended blood sugar levels on waking up are 90-130 and about 2 hours after meal is 130-180  Please bring your blood sugar monitor to each visit, thank you  Stop Acarbose and Bromocriptine   2 Prandin before dinner  Call re: Strips

## 2022-03-23 NOTE — Progress Notes (Signed)
Patient ID: Alyssa Ford, female   DOB: August 31, 1950, 71 y.o.   MRN: [id number]           Reason for Appointment: Type II Diabetes follow-up   History of Present Illness   Diagnosis date: 2000  Previous history:  She has been on metformin likely since diagnosis Rybelsus was started in 2/22 and 7 mg since 3/22 She has been on Prandin since 2018 at least and acarbose in 6/22   A1c range in the last few years is: 5.9-7.6  Recent history:     Non-insulin hypoglycemic drugs:    Precose 50 mg 3 times daily, metformin 2 g daily, Prandin 1 mg twice daily, Rybelsus 7 mg daily,    Side effects from medications: Bloating and mild nausea from Rybelsus or Precose  Current self management, blood sugar patterns and problems identified:  A1c is 6.8 She did not bring a monitor for download and unclear what brand of monitor she is using She says that she has been having some higher readings after dinner although inconsistent Usually not checking much after meals especially breakfast or lunch She has been on Rybelsus for couple of years but she thinks she is having symptoms of bloating, decreased desire to eat and mild nausea and constipation; however is also taking acarbose 3 times a day She usually tries to take her Prandin before meals at breakfast and dinnertime Her weight is about the same overall and she is usually trying to watch her diet and exercise However does not have any meal plan to follow In the last 10 days she has been drinking cherry juice to help her sleep at night but this is containing 33 g of sugar  Exercise: She is walking about 30 minutes at least 4 days a week     Hypoglycemia:  none    Glucometer: [employer]     Blood Glucose readings from recall  PRE-MEAL Fasting Lunch Dinner Bedtime Overall  Glucose range: 110      Mean/median:        POST-MEAL PC Breakfast PC Lunch PC Dinner  Glucose range:   160-200  Mean/median:       Dietician visit: Most recent:   at diagnosis time  Weight control:  Wt Readings from Last 3 Encounters:  03/23/22 154 lb (69.9 kg)  01/31/22 151 lb 9.6 oz (68.8 kg)  01/03/22 153 lb (69.4 kg)            Diabetes labs:  Lab Results  Component Value Date   HGBA1C 6.8 (H) 01/31/2022   HGBA1C 6.2 (A) 07/28/2021   HGBA1C 5.9 (A) 03/22/2021   Lab Results  Component Value Date   MICROALBUR <0.7 02/01/2022   LDLCALC 72 01/31/2022   CREATININE 0.80 01/31/2022    No results found for: "FRUCTOSAMINE"   Allergies as of 03/23/2022       Reactions   Codeine Nausea Only   Severe nausea        Medication List        Accurate as of March 23, 2022 10:03 AM. If you have any questions, ask your nurse or doctor.          acarbose 50 MG tablet Commonly known as: PRECOSE TAKE 1 TABLET(50 MG) BY MOUTH THREE TIMES DAILY WITH MEALS   ALEVE PO Take 2 tablets by mouth daily.   amitriptyline 25 MG tablet Commonly known as: ELAVIL Take 2 tablets (50 mg total) by mouth daily at 12 noon.  aspirin EC 81 MG tablet Take 81 mg by mouth daily.   BIOTIN PO Take 1 tablet by mouth daily.   bromocriptine 2.5 MG tablet Commonly known as: PARLODEL TAKE 1 TABLET(2.5 MG) BY MOUTH DAILY   CO Q 10 PO Take 1 capsule by mouth daily.   fenofibrate 160 MG tablet TAKE 1 TABLET(160 MG) BY MOUTH DAILY   ipratropium 0.06 % nasal spray Commonly known as: ATROVENT Place 1 spray into both nostrils 2 (two) times daily as needed.   IRON SUPPLEMENT PO Take 1 tablet by mouth daily. '65mg'$    latanoprost 0.005 % ophthalmic solution Commonly known as: XALATAN Place 1 drop into both eyes at bedtime.   levothyroxine 25 MCG tablet Commonly known as: SYNTHROID TAKE 1 TABLET(25 MCG) BY MOUTH DAILY   lisinopril 5 MG tablet Commonly known as: ZESTRIL Take 1 tablet (5 mg total) by mouth daily.   Melatonin 10 MG Tabs Take 1 tablet by mouth at bedtime as needed (sleep).   metFORMIN 500 MG 24 hr tablet Commonly known as:  GLUCOPHAGE-XR TAKE 4 TABLETS(2000 MG) BY MOUTH DAILY WITH BREAKFAST   omeprazole 40 MG capsule Commonly known as: PRILOSEC TAKE 1 CAPSULE(40 MG) BY MOUTH DAILY   PROBIOTIC-10 PO Take 1 tablet by mouth daily.   Quviviq 25 MG Tabs Generic drug: Daridorexant HCl 1 po qhs prn   Quviviq 50 MG Tabs Generic drug: Daridorexant HCl 1 po qhs   repaglinide 1 MG tablet Commonly known as: PRANDIN Take 1 tablet (1 mg total) by mouth 2 (two) times daily before a meal.   rosuvastatin 40 MG tablet Commonly known as: CRESTOR Take 1 tablet (40 mg total) by mouth at bedtime.   Rybelsus 7 MG Tabs Generic drug: Semaglutide Take 7 mg by mouth daily.        Allergies:  Allergies  Allergen Reactions   Codeine Nausea Only    Severe nausea    Past Medical History:  Diagnosis Date   Anemia    Anxiety    Arthritis    Cataract    Chronic headaches    Depression    Diabetes mellitus    Diverticulosis of colon (without mention of hemorrhage) 2003, 2008   Colonoscopy   GERD (gastroesophageal reflux disease)    Glaucoma    Hiatal hernia 2003   EGD    Hyperlipidemia    Hypertension    Thyroid nodule     Past Surgical History:  Procedure Laterality Date   CARPAL TUNNEL RELEASE  2006   CARPAL TUNNEL RELEASE  12/24/2007   CATARACT EXTRACTION, BILATERAL     [place]---  sept and oct 2018   CERVICAL FUSION  3/08   CHOLECYSTECTOMY     FASCIECTOMY Left 08/01/2018   Procedure: FASCIECTOMY LEFT RING;  Surgeon: Daryll Brod, MD;  Location: [facility];  Service: Orthopedics;  Laterality: Left;  UPPER ARM   TRIGGER FINGER RELEASE Left 08/01/2018   Procedure: RELEASE TRIGGER FINGER/A-1 PULLEY [place];  Surgeon: Daryll Brod, MD;  Location: [place];  Service: Orthopedics;  Laterality: Left;    Family History  Problem Relation Age of Onset   Dementia Mother    Breast cancer Mother 87   Kidney failure Mother 11       died from kidney failure from abx given to her  for pneumonia   Hyperlipidemia Mother    Hypertension Mother    Diabetes Mother    Cancer Mother 74       breast  Parkinsonism Father    Diabetes Father 3   Hypertension Sister    Diabetes Brother    Hypertension Brother    Heart attack Brother    Heart disease Cousin 73       mi   Colon cancer Neg Hx    Esophageal cancer Neg Hx    Stomach cancer Neg Hx     Social History:  reports that she quit smoking about 6 years ago. Her smoking use included cigarettes. She has a 68.60 pack-year smoking history. She has never used smokeless tobacco. She reports current alcohol use. She reports that she does not use drugs.  Review of Systems:  Last diabetic eye exam date 10/20  Last urine microalbumin date: 8/23  Last foot exam date:9/23  Symptoms of neuropathy: None  Hypertension:   ACE/ARB medication: Lisinopril 5 mg daily  BP Readings from Last 3 Encounters:  03/23/22 132/70  01/31/22 130/80  01/03/22 (!) 114/51    Lipid management: Managed by PCP with fenofibrate and 40 mg Crestor    Lab Results  Component Value Date   CHOL 139 01/31/2022   CHOL 109 07/04/2021   CHOL 113 02/03/2021   Lab Results  Component Value Date   HDL 44.40 01/31/2022   HDL 43.70 07/04/2021   HDL 39.50 02/03/2021   Lab Results  Component Value Date   LDLCALC 72 01/31/2022   LDLCALC 25 07/04/2021   LDLCALC 46 02/03/2021   Lab Results  Component Value Date   TRIG 112.0 01/31/2022   TRIG 197.0 (H) 07/04/2021   TRIG 139.0 02/03/2021   Lab Results  Component Value Date   CHOLHDL 3 01/31/2022   CHOLHDL 2 07/04/2021   CHOLHDL 3 02/03/2021   Lab Results  Component Value Date   LDLDIRECT 87.0 06/07/2020   LDLDIRECT 103.0 04/08/2018   LDLDIRECT 197.0 08/25/2011     Fibrosis 4 Score = 1.37       Fib-4 interpretation is not validated for people under 67 or over 40 years of age.    Thyroid: She was told to take 25 mcg of levothyroxine in 2022 when her TSH was mildly  increased However she did not feel any better with this and also is being followed by ENT physician for multinodular goiter  Lab Results  Component Value Date   TSH 1.92 01/31/2022   TSH 3.66 02/03/2021   TSH 6.51 (H) 08/24/2020   FREET4 1.31 08/24/2020   FREET4 1.28 04/22/2020   FREET4 0.98 08/25/2011      Examination:   BP 132/70   Pulse 70   Ht '5\' 6"'$  (1.676 m)   Wt 154 lb (69.9 kg)   SpO2 99%   BMI 24.86 kg/m   Body mass index is 24.86 kg/m.   Diabetic Foot Exam - Simple   Simple Foot Form Diabetic Foot exam was performed with the following findings: Yes   Visual Inspection No deformities, no ulcerations, no other skin breakdown bilaterally: Yes Sensation Testing Intact to touch and monofilament testing bilaterally: Yes Pulse Check Posterior Tibialis and Dorsalis pulse intact bilaterally: Yes Comments      ASSESSMENT/ PLAN:    Diabetes type 2:   Current regimen:Precose 50 mg 3 times daily, metformin 2 g daily, Prandin 1 mg twice daily, Rybelsus 7 mg daily,  See history of present illness for detailed discussion of current diabetes management, blood sugar patterns and problems identified  A1c is most recently 6.8 She is nonobese Appears to be fairly consistent with regular exercise  program and diet management Difficult to assess her daily blood sugar patterns and she did not bring her meter and not clear how often she is monitoring especially after meals Also has continued GI symptoms likely to be from acarbose 50 mg dose rather than Rybelsus  Diabetic foot exam normal today  Recommendations:  She does need to leave off acarbose as this is a relatively high dose and likely causing GI symptoms If she still has continued GI symptoms may consider switching Rybelsus to Trulicity or Ozempic Stop bromocriptine and explained to her that unless she is taking Cycloset this is ineffective for diabetes She will increase Prandin to 2 mg before dinnertime to keep her  blood sugars consistently below 180 after dinner and preferably below 160 Continue regular walking or exercise We can give her a prescription for test trips but need to confirm that she is using an Accu-Chek, however may consider switching to One Touch Verio for ease of downloads  Subclinical hypothyroidism: Unlikely that she had symptoms with mild increase in TSH last year and now TSH is trending down below 2.0 She can leave off the levothyroxine and we will recheck her TSH on the next visit  Mixed hyperlipidemia: Well-controlled with current regimen and to continue follow-up with PCP  Total visit time for evaluation and management of above problems and review of relevant records as well as counseling = 35 minutes  Patient Instructions  Check blood sugars on waking up 1-2  days a week  Also check blood sugars about 2 hours after meals and do this after different meals by rotation  Recommended blood sugar levels on waking up are 90-130 and about 2 hours after meal is 130-180  Please bring your blood sugar monitor to each visit, thank you  Stop Acarbose and Bromocriptine   2 Prandin before dinner  Call re: Strips     Elayne Snare 03/23/2022, 10:03 AM

## 2022-03-24 ENCOUNTER — Encounter: Payer: Self-pay | Admitting: Endocrinology

## 2022-04-20 ENCOUNTER — Telehealth: Payer: Self-pay

## 2022-04-20 NOTE — Telephone Encounter (Signed)
Mailed Novo Nordisk renewal application to patient home.   [place]onesia Mckeough L. CPhT Rx Patient Advocate  

## 2022-04-26 ENCOUNTER — Other Ambulatory Visit: Payer: Self-pay | Admitting: Family Medicine

## 2022-04-26 ENCOUNTER — Ambulatory Visit (INDEPENDENT_AMBULATORY_CARE_PROVIDER_SITE_OTHER): Payer: Medicare Other | Admitting: Pharmacist

## 2022-04-26 DIAGNOSIS — K219 Gastro-esophageal reflux disease without esophagitis: Secondary | ICD-10-CM

## 2022-04-26 DIAGNOSIS — E1165 Type 2 diabetes mellitus with hyperglycemia: Secondary | ICD-10-CM

## 2022-04-26 DIAGNOSIS — I10 Essential (primary) hypertension: Secondary | ICD-10-CM

## 2022-04-26 DIAGNOSIS — G4700 Insomnia, unspecified: Secondary | ICD-10-CM

## 2022-04-26 NOTE — Progress Notes (Signed)
Pharmacy Note  04/26/2022 Name: Alyssa Ford MRN: [id number] DOB: 17-Nov-1950  Subjective: Alyssa Ford is Ford 71 y.o. year old female who is Ford primary care patient of Alyssa Ford, Alyssa Apa, DO. Clinical Pharmacist Practitioner referral was placed to assist with medication management.    Engaged with patient by telephone for follow up visit today.  Hypertension: Controlled; BP goal <140/90; Current regimen: Lisinopril '5mg'$  daily in morning. No recent dizziness  Hyperlipidemia: Controlled; LDL goal < 100 and Triglycerides <150. Taking rosuvastatin '40mg'$  daily. Diabetes: Controlled; A1c goal <7%. Saw Alyssa Alyssa Ford 03/23/2022 (first visit with Alyssa Alyssa Ford - previously was seeing Alyssa Ford) Alyssa Alyssa Ford stopped bromocriptine and acarbose (thought was cause GI issues - nausea and bloating. Patient reports bloating has improved and appetite has improved. She continues to take Rybelsus '7mg'$  daily, Prandin '2mg'$  prior to evening meal and metformin ER '500mg'$  - take 4 tablets = '2000mg'$  daily with breakfast. Patient was concerned that post prandial blood glucose has too high - reports ranges form 140's to 170's.  Insomnia: Improved Ford little Goal is 7 to 9 hours of restful sleep. Current therapy: over-the-counter CBD gummy - started on her own about 10 days ago and has helped with being able to sleep Ford little longer.  Previous therapy tried: Ronette Deter '50mg'$ , melatonin - not effective, trazodone - not effective. She has taken amitriptyline '50mg'$  at night for several years for jaw pain. It used to help with sleep but not anymore.     SDOH (Social Determinants of Health) assessments and interventions performed:  SDOH Interventions    Flowsheet Row Chronic Care Management from 12/23/2021 in [facility] at [place] Management from 05/25/2021 in [facility] at [place] Management from 11/10/2020 in [place] at [place] Interventions     Financial Strain Interventions Other (Comment)  [assisted in early 2023 with Rybelsus PAP - approved thru 05/2022] Other (Comment)  [Getting Rybelsus through patient assistance - current approved thru 06/25/2021] --  Physical Activity Interventions Intervention Not Indicated -- Other (Comments)  [discussed weekly goal of 150 minutes of moderate intensity exercise,  patient increase frequency of axercise.]        Objective: Review of patient status, including review of consultants reports, laboratory and other test data, was performed as part of comprehensive evaluation and provision of chronic care management services.   Lab Results  Component Value Date   CREATININE 0.80 01/31/2022   CREATININE 0.76 10/04/2021   CREATININE 0.80 07/04/2021    Lab Results  Component Value Date   HGBA1C 6.8 (H) 01/31/2022       Component Value Date/Time   CHOL 139 01/31/2022 1341   TRIG 112.0 01/31/2022 1341   TRIG 216 (HH) 04/24/2006 0741   HDL 44.40 01/31/2022 1341   CHOLHDL 3 01/31/2022 1341   VLDL 22.4 01/31/2022 1341   LDLCALC 72 01/31/2022 1341   LDLDIRECT 87.0 06/07/2020 0838     BP Readings from Last 3 Encounters:  03/23/22 132/70  01/31/22 130/80  01/03/22 (!) 114/51     Allergies  Allergen Reactions   Codeine Nausea Only    Severe nausea    Medications Reviewed Today     Reviewed by Alyssa Ford, RPH-CPP (Pharmacist) on 04/26/22 at 1026  Med List Status: <None>   Medication Order Taking? Sig Documenting Provider Last Dose Status Informant  amitriptyline (ELAVIL) 25 MG tablet [id number]  Take 2 tablets (50 mg total) by mouth daily at 12 noon.  Patient taking differently: Take 50 mg by mouth at bedtime.   Alyssa Held, DO  Active   aspirin EC 81 MG tablet [id number] Yes Take 81 mg by mouth daily. Alyssa Held, DO Taking Active Self  BIOTIN PO [id number] Yes Take 1 tablet by mouth daily.  [provider]  Taking Active Self  Coenzyme Q10 (CO Q 10 PO) [id number] Yes Take 1 capsule by mouth daily. [provider] Taking Active   Daridorexant HCl (QUVIVIQ) 50 MG TABS [id number] No 1 po qhs  Patient not taking: Reported on 04/26/2022   Alyssa Held, DO Not Taking Active   fenofibrate 160 MG tablet [id number] Yes TAKE 1 TABLET(160 MG) BY MOUTH DAILY Alyssa Held, DO Taking Active   Ferrous Sulfate (IRON SUPPLEMENT PO) [id number] Yes Take 1 tablet by mouth daily. '65mg'$  [provider] Taking Active            Med Note Alyssa Ford, Alyssa Ford   Fri Dec 23, 2021 11:27 AM) Taking every other day due to constipation  ipratropium (ATROVENT) 0.06 % nasal spray [id number]  Place 1 spray into both nostrils 2 (two) times daily as needed. [provider]  Active   latanoprost (XALATAN) 0.005 % ophthalmic solution [id number] Yes Place 1 drop into both eyes at bedtime.  [provider] Taking Active            Med Note Alyssa Ford, Alyssa Ford   Thu Aug 19, 2020  3:42 PM)    lisinopril (ZESTRIL) 5 MG tablet [id number] Yes Take 1 tablet (5 mg total) by mouth daily. Alyssa Schanz R, DO Taking Active   Melatonin 10 MG TABS [id number] Yes Take 1 tablet by mouth at bedtime as needed (sleep). [provider] Taking Active   metFORMIN (GLUCOPHAGE-XR) 500 MG 24 hr tablet [id number] Yes TAKE 4 TABLETS(2000 MG) BY MOUTH DAILY WITH Alyssa Spark, MD Taking Active   Naproxen Sodium (ALEVE PO) [id number] Yes Take 2 tablets by mouth daily.  [provider] Taking Active Self           Med Note Alyssa Ford, Alyssa Ford   Tue Jan 18, 2021  9:00 AM) Taking as needed  omeprazole (PRILOSEC) 40 MG capsule [id number] Yes TAKE 1 CAPSULE(40 MG) BY MOUTH DAILY Alyssa Held, DO Taking Active   OVER THE COUNTER MEDICATION [id number] Yes Take 1 each by mouth at bedtime as needed (sleep). CBD Gummy [provider] Taking Active   Probiotic Product (PROBIOTIC-10 PO) [id number]  Yes Take 1 tablet by mouth daily as needed. [provider] Taking Active   repaglinide (PRANDIN) 1 MG tablet [id number] Yes Take 1 tablet (1 mg total) by mouth 2 (two) times daily before Ford meal.  Patient taking differently: Take 2 mg by mouth daily. Before evening meal.   Renato Shin, MD Taking Active   rosuvastatin (CRESTOR) 40 MG tablet [id number] Yes Take 1 tablet (40 mg total) by mouth at bedtime. Alyssa Ford, Kendrick Fries R, DO Taking Active   Semaglutide (RYBELSUS) 7 MG TABS [id number] Yes Take 7 mg by mouth daily. Renato Shin, MD Taking Active            Med Note [facility], Adria Dill Aug 18, 2021  3:29 PM) Approved for NovoNordisk medication assistance program thru 05/25/2022            Patient Active Problem  List   Diagnosis Date Noted   Preventative health care 02/05/2022   Pulmonary HTN ([place]) 07/04/2021   Hyperlipidemia associated with type 2 diabetes mellitus ([place]) 07/04/2021   Atherosclerosis of aorta ([place]) 07/04/2021   Angioma of skin 06/02/2021   Vaginal itching 05/23/2021   Hematuria 05/23/2021   Insomnia 05/13/2021   Hypothyroidism 08/24/2020   Chest pain 08/06/2020   Trichimoniasis 04/27/2020   Cervical cancer screening 04/27/2020   Left thyroid nodule 04/13/2020   Vaginal odor 01/11/2020   Icterus 01/11/2020   Uncontrolled type 2 diabetes mellitus with hyperglycemia ([place]) 09/22/2019   De Quervain's disease (tenosynovitis) 09/22/2019   Neck mass 07/11/2018   Abnormal WBC count 05/29/2017   Skin fissure 09/24/2015   Allergic rhinitis 03/26/2014   Wheezing 03/26/2014   DM (diabetes mellitus) type II uncontrolled, periph vascular disorder 08/25/2011   Headache(784.0) 08/21/2011   Acute sinusitis 04/26/2010   URI 04/26/2010   UNSPECIFIED CATARACT 02/23/2010   POSTMENOPAUSAL STATUS 12/12/2007   FATIGUE 09/11/2007   THYROID NODULE 11/22/2006   Diabetes ([place]) 11/22/2006   Hyperlipidemia LDL goal <70 11/22/2006   GLUCOMA 11/22/2006   Essential  hypertension 11/22/2006   GERD 11/22/2006     Medication Assistance:   Rybelsus obtained through [employer]  medication assistance program.  Enrollment ends 06/25/2022. Application for [id number] has been mailed to patient's home 04/20/2022   Assessment / Plan: Hypertension: Controlled; BP goal <140/90 Continue Lisinopril '5mg'$  daily in morning. Hyperlipidemia: Controlled; LDL goal < 100 and Triglycerides <150.  Continue rosuvastatin '40mg'$  daily. Diabetes: Controlled; A1c goal <7%.  Continue to follow up with Alyssa Alyssa Ford  Continue current regimen for diabetes - Rybelsus '7mg'$  daily, metformin ER '500mg'$  - 4 tablets daliy and Prandin '2mg'$  prior to evening meal.  [id number] application for medication assistance program for Rybelsus has been started. Mailed to patient's home 04/20/2022. Currently patient reports she has about 6 bottles (30 count each) of Rybelsus on hand. Recommended she check blood glucose 2 or 3 times Ford week after Ford meal.  Reviewed home blood glucose  goals  Fasting blood glucose goal (before meals) = 80 to 130 Blood glucose goal after Ford meal = less than 180    Insomnia: Improved Ford little Goal is 7 to 9 hours of restful sleep. Continue current over-the-counter therapy with CBD gummies.  Health Maintenance:  Discussed eye exam. Patient has had several exams this year with Alyssa [facility] and [facility]. Called their office. No diabetic eye exam in 2023 but she has one scheduled for 06/2022.   Follow Up:  Telephone follow up appointment with care management team member scheduled for:  2 months - check 2024 pap and BG   Alyssa Ford, PharmD Clinical Pharmacist [place] [phone]

## 2022-05-04 ENCOUNTER — Ambulatory Visit (INDEPENDENT_AMBULATORY_CARE_PROVIDER_SITE_OTHER): Payer: Medicare Other | Admitting: Family Medicine

## 2022-05-04 ENCOUNTER — Encounter: Payer: Self-pay | Admitting: Family Medicine

## 2022-05-04 VITALS — BP 114/71 | HR 77 | Temp 97.5°F | Ht 66.0 in | Wt 153.0 lb

## 2022-05-04 DIAGNOSIS — J019 Acute sinusitis, unspecified: Secondary | ICD-10-CM

## 2022-05-04 MED ORDER — AMOXICILLIN 875 MG PO TABS: 875.0000 mg | ORAL_TABLET | Freq: Two times a day (BID) | ORAL | 0 refills | Status: AC

## 2022-05-04 NOTE — Progress Notes (Signed)
OFFICE VISIT  05/04/2022  CC:  Chief Complaint  Patient presents with   Head Congestion    Has taken Mucinex, helps a little but comes back. Symptoms started 4 days ago   Body Aches    Minor    Headache    Has been taking Aleve    Cough    Dry     Patient is a 71 y.o. female who presents for sinus congestion.  HPI: Onset 5d/a, bad nasal congestion, mild ST.  Has some PND cough. Symptoms have gradually worsened, now having face pain and upper teeth pain bilat. No fevers.  No wheezing or sob.  No n/v/d. +fatigue.  Past Medical History:  Diagnosis Date   Anemia    Anxiety    Arthritis    Cataract    Chronic headaches    Depression    Diabetes mellitus    Diverticulosis of colon (without mention of hemorrhage) 2003, 2008   Colonoscopy   GERD (gastroesophageal reflux disease)    Glaucoma    Hiatal hernia 2003   EGD    Hyperlipidemia    Hypertension    Thyroid nodule     Past Surgical History:  Procedure Laterality Date   CARPAL TUNNEL RELEASE  2006   CARPAL TUNNEL RELEASE  12/24/2007   CATARACT EXTRACTION, BILATERAL     [facility]---  sept and oct 2018   CERVICAL FUSION  3/08   CHOLECYSTECTOMY     FASCIECTOMY Left 08/01/2018   Procedure: FASCIECTOMY LEFT RING;  Surgeon: Daryll Brod, MD;  Location: [facility];  Service: Orthopedics;  Laterality: Left;  UPPER ARM   TRIGGER FINGER RELEASE Left 08/01/2018   Procedure: RELEASE TRIGGER FINGER/A-1 PULLEY [place];  Surgeon: Daryll Brod, MD;  Location: [facility];  Service: Orthopedics;  Laterality: Left;    Outpatient Medications Prior to Visit  Medication Sig Dispense Refill   amitriptyline (ELAVIL) 25 MG tablet Take 2 tablets (50 mg total) by mouth daily at 12 noon. (Patient taking differently: Take 50 mg by mouth at bedtime.) 180 tablet 1   aspirin EC 81 MG tablet Take 81 mg by mouth daily.     BIOTIN PO Take 1 tablet by mouth daily.      Coenzyme Q10 (CO Q 10 PO) Take 1 capsule by mouth  daily.     fenofibrate 160 MG tablet TAKE 1 TABLET(160 MG) BY MOUTH DAILY 90 tablet 1   Ferrous Sulfate (IRON SUPPLEMENT PO) Take 1 tablet by mouth daily. '65mg'$      ipratropium (ATROVENT) 0.06 % nasal spray Place 1 spray into both nostrils 2 (two) times daily as needed.     latanoprost (XALATAN) 0.005 % ophthalmic solution Place 1 drop into both eyes at bedtime.      lisinopril (ZESTRIL) 5 MG tablet Take 1 tablet (5 mg total) by mouth daily. 90 tablet 1   Melatonin 10 MG TABS Take 1 tablet by mouth at bedtime as needed (sleep).     metFORMIN (GLUCOPHAGE-XR) 500 MG 24 hr tablet TAKE 4 TABLETS(2000 MG) BY MOUTH DAILY WITH BREAKFAST 360 tablet 0   Naproxen Sodium (ALEVE PO) Take 2 tablets by mouth daily.      omeprazole (PRILOSEC) 40 MG capsule TAKE 1 CAPSULE(40 MG) BY MOUTH DAILY 90 capsule 1   OVER THE COUNTER MEDICATION Take 1 each by mouth at bedtime as needed (sleep). CBD Gummy     Probiotic Product (PROBIOTIC-10 PO) Take 1 tablet by mouth daily as needed.  repaglinide (PRANDIN) 1 MG tablet Take 1 tablet (1 mg total) by mouth 2 (two) times daily before a meal. (Patient taking differently: Take 2 mg by mouth daily. Before evening meal.) 180 tablet 3   rosuvastatin (CRESTOR) 40 MG tablet Take 1 tablet (40 mg total) by mouth at bedtime. 90 tablet 1   Semaglutide (RYBELSUS) 7 MG TABS Take 7 mg by mouth daily. 90 tablet 3   Daridorexant HCl (QUVIVIQ) 50 MG TABS 1 po qhs (Patient not taking: Reported on 04/26/2022) 30 tablet 1   No facility-administered medications prior to visit.    Allergies  Allergen Reactions   Codeine Nausea Only    Severe nausea    ROS As per HPI  PE:    05/04/2022   11:17 AM 03/23/2022    8:44 AM 01/31/2022    1:11 PM  Vitals with BMI  Height '5\' 6"'$  '5\' 6"'$  '5\' 6"'$   Weight 153 lbs 154 lbs 151 lbs 10 oz  BMI 24.71 00.92 33.00  Systolic 762 263 335  Diastolic 71 70 80  Pulse 77 70 71     Physical Exam  VS: noted--normal. Gen: alert, NAD, NONTOXIC  APPEARING. HEENT: eyes without injection, drainage, or swelling.  Ears: EACs clear, TMs with normal light reflex and landmarks.  Nose: Clear rhinorrhea, with some dried, crusty exudate adherent to mildly injected mucosa.  No purulent d/c.  No paranasal sinus TTP.  No facial swelling.  Throat and mouth without focal lesion.  No pharyngial swelling, erythema, or exudate.   Neck: supple, no LAD.   LUNGS: CTA bilat, nonlabored resps.   CV: RRR, no m/r/g. EXT: no c/c/e SKIN: no rash   LABS:  Last metabolic panel Lab Results  Component Value Date   GLUCOSE 93 01/31/2022   NA 142 01/31/2022   K 4.2 01/31/2022   CL 99 01/31/2022   CO2 29 01/31/2022   BUN 16 01/31/2022   CREATININE 0.80 01/31/2022   GFRNONAA >60 04/30/2021   CALCIUM 10.0 01/31/2022   PROT 7.1 01/31/2022   ALBUMIN 4.6 01/31/2022   BILITOT 0.3 01/31/2022   ALKPHOS 42 01/31/2022   AST 25 01/31/2022   ALT 29 01/31/2022   ANIONGAP 10 04/30/2021   Lab Results  Component Value Date   HGBA1C 6.8 (H) 01/31/2022   IMPRESSION AND PLAN:  URI.  Given the severity of her symptoms we will treat as bacterial sinusitis. Amoxicillin 875 twice daily x7 days. Saline nasal spray recommended.  Rapid flu and covid testing here today NEG.  An After Visit Summary was printed and given to the patient.  FOLLOW UP: Return if symptoms worsen or fail to improve.  Signed:  Crissie Sickles, MD           05/04/2022

## 2022-05-31 NOTE — Telephone Encounter (Signed)
Received pt portion PAP application   Faxed Prescriber potion or PAP application on 58/25 and 12/06 waiting on prescriber part.

## 2022-06-02 ENCOUNTER — Other Ambulatory Visit (HOSPITAL_BASED_OUTPATIENT_CLINIC_OR_DEPARTMENT_OTHER): Payer: Self-pay

## 2022-06-02 ENCOUNTER — Encounter: Payer: Self-pay | Admitting: Family Medicine

## 2022-06-02 ENCOUNTER — Ambulatory Visit (INDEPENDENT_AMBULATORY_CARE_PROVIDER_SITE_OTHER): Payer: Medicare Other | Admitting: Family Medicine

## 2022-06-02 VITALS — BP 132/70 | HR 78 | Temp 98.5°F | Resp 18 | Ht 66.0 in | Wt 150.8 lb

## 2022-06-02 DIAGNOSIS — J309 Allergic rhinitis, unspecified: Secondary | ICD-10-CM

## 2022-06-02 DIAGNOSIS — J302 Other seasonal allergic rhinitis: Secondary | ICD-10-CM | POA: Diagnosis not present

## 2022-06-02 MED ORDER — NONFORMULARY OR COMPOUNDED ITEM: 0 refills | Status: DC

## 2022-06-02 MED ORDER — AZELASTINE HCL 0.1 % NA SOLN: 1.0000 | Freq: Two times a day (BID) | NASAL | 12 refills | Status: DC

## 2022-06-02 MED ORDER — AREXVY 120 MCG/0.5ML IM SUSR
INTRAMUSCULAR | 0 refills | Status: DC
  Filled 2022-06-02: qty 0.5, 1d supply, fill #0

## 2022-06-02 NOTE — Progress Notes (Signed)
Established Patient Office Visit  Subjective   Patient ID: Alyssa Ford, female    DOB: 03/12/1951  Age: 71 y.o. MRN: [id number]  Chief Complaint  Patient presents with   Sinus Problem    Pt states sxs have been going on for years and states sxs have gotten worse. Pt states having a cold a few weeks ago and states having to blow her nose contantly while eating    HPI Pt is here c/o clear drainage and allergies.  It has been going on for years but recently worsened.  No fevers +cough--  white/ clear mucus Patient Active Problem List   Diagnosis Date Noted   Preventative health care 02/05/2022   Pulmonary HTN ([place]) 07/04/2021   Hyperlipidemia associated with type 2 diabetes mellitus ([place]) 07/04/2021   Atherosclerosis of aorta ([place]) 07/04/2021   Angioma of skin 06/02/2021   Vaginal itching 05/23/2021   Hematuria 05/23/2021   Insomnia 05/13/2021   Hypothyroidism 08/24/2020   Chest pain 08/06/2020   Trichimoniasis 04/27/2020   Cervical cancer screening 04/27/2020   Left thyroid nodule 04/13/2020   Vaginal odor 01/11/2020   Icterus 01/11/2020   Uncontrolled type 2 diabetes mellitus with hyperglycemia ([place]) 09/22/2019   De Quervain's disease (tenosynovitis) 09/22/2019   Neck mass 07/11/2018   Abnormal WBC count 05/29/2017   Skin fissure 09/24/2015   Allergic rhinitis 03/26/2014   Wheezing 03/26/2014   DM (diabetes mellitus) type II uncontrolled, periph vascular disorder 08/25/2011   Headache(784.0) 08/21/2011   Acute sinusitis 04/26/2010   URI 04/26/2010   UNSPECIFIED CATARACT 02/23/2010   POSTMENOPAUSAL STATUS 12/12/2007   FATIGUE 09/11/2007   THYROID NODULE 11/22/2006   Diabetes ([place]) 11/22/2006   Hyperlipidemia LDL goal <70 11/22/2006   GLUCOMA 11/22/2006   Essential hypertension 11/22/2006   GERD 11/22/2006   Past Medical History:  Diagnosis Date   Anemia    Anxiety    Arthritis    Cataract    Chronic headaches    Depression    Diabetes mellitus     Diverticulosis of colon (without mention of hemorrhage) 2003, 2008   Colonoscopy   GERD (gastroesophageal reflux disease)    Glaucoma    Hiatal hernia 2003   EGD    Hyperlipidemia    Hypertension    Thyroid nodule    Past Surgical History:  Procedure Laterality Date   CARPAL TUNNEL RELEASE  2006   CARPAL TUNNEL RELEASE  12/24/2007   CATARACT EXTRACTION, BILATERAL     [facility]---  sept and oct 2018   CERVICAL FUSION  3/08   CHOLECYSTECTOMY     FASCIECTOMY Left 08/01/2018   Procedure: FASCIECTOMY LEFT RING;  Surgeon: Daryll Brod, MD;  Location: [facility];  Service: Orthopedics;  Laterality: Left;  UPPER ARM   TRIGGER FINGER RELEASE Left 08/01/2018   Procedure: RELEASE TRIGGER FINGER/A-1 PULLEY [place];  Surgeon: Daryll Brod, MD;  Location: [facility];  Service: Orthopedics;  Laterality: Left;   Social History   Tobacco Use   Smoking status: Former    Packs/day: 1.40    Years: 49.00    Total pack years: 68.60    Types: Cigarettes    Quit date: 07/16/2015    Years since quitting: 6.8   Smokeless tobacco: Never   Tobacco comments:    Continues to vape but states pod do not contain nicotine  Vaping Use   Vaping Use: Every day  Substance Use Topics   Alcohol use: Yes    Alcohol/week: 0.0  standard drinks of alcohol    Comment: once weekly; wine and beer   Drug use: No   Social History   Socioeconomic History   Marital status: Married    Spouse name: Not on file   Number of children: Not on file   Years of education: Not on file   Highest education level: Not on file  Occupational History   Occupation: retired  Tobacco Use   Smoking status: Former    Packs/day: 1.40    Years: 49.00    Total pack years: 68.60    Types: Cigarettes    Quit date: 07/16/2015    Years since quitting: 6.8   Smokeless tobacco: Never   Tobacco comments:    Continues to vape but states pod do not contain nicotine  Vaping Use   Vaping Use: Every day  Substance and  Sexual Activity   Alcohol use: Yes    Alcohol/week: 0.0 standard drinks of alcohol    Comment: once weekly; wine and beer   Drug use: No   Sexual activity: Not Currently    Partners: Male  Other Topics Concern   Not on file  Social History Narrative   Exercise--  walk   Social Determinants of Health   Financial Resource Strain: Medium Risk (12/23/2021)   Overall Financial Resource Strain (CARDIA)    Difficulty of Paying Living Expenses: Somewhat hard  Food Insecurity: No Food Insecurity (01/18/2021)   Hunger Vital Sign    Worried About Running Out of Food in the Last Year: Never true    Ran Out of Food in the Last Year: Never true  Transportation Needs: No Transportation Needs (01/18/2021)   PRAPARE - Hydrologist (Medical): No    Lack of Transportation (Non-Medical): No  Physical Activity: Insufficiently Active (12/23/2021)   Exercise Vital Sign    Days of Exercise per Week: 4 days    Minutes of Exercise per Session: 30 min  Stress: No Stress Concern Present (01/18/2021)   [place]    Feeling of Stress : Only a little  Social Connections: Moderately Isolated (01/18/2021)   Social Connection and Isolation Panel [NHANES]    Frequency of Communication with Friends and Family: More than three times a week    Frequency of Social Gatherings with Friends and Family: More than three times a week    Attends Religious Services: Never    Marine scientist or Organizations: No    Attends Archivist Meetings: Never    Marital Status: Married  Human resources officer Violence: Not At Risk (01/18/2021)   Humiliation, Afraid, Rape, and Kick questionnaire    Fear of Current or Ex-Partner: No    Emotionally Abused: No    Physically Abused: No    Sexually Abused: No   Family Status  Relation Name Status   Mother  Deceased   Father  Deceased   Sister  Alive   Sister  Alive   Brother   Deceased at age 58       MI   Mat Aunt  Deceased at age 72   Cousin  Alive   Neg Hx  (Not Specified)   Family History  Problem Relation Age of Onset   Dementia Mother    Breast cancer Mother 95   Kidney failure Mother 74       died from kidney failure from abx given to her for pneumonia   Hyperlipidemia Mother  Hypertension Mother    Diabetes Mother    Cancer Mother 28       breast   Parkinsonism Father    Diabetes Father 36   Hypertension Sister    Diabetes Brother    Hypertension Brother    Heart attack Brother    Heart disease Cousin 39       mi   Colon cancer Neg Hx    Esophageal cancer Neg Hx    Stomach cancer Neg Hx    Allergies  Allergen Reactions   Codeine Nausea Only    Severe nausea      ROS    Objective:     BP 132/70 (BP Location: Left Arm, Patient Position: Sitting, Cuff Size: Normal)   Pulse 78   Temp 98.5 F (36.9 C) (Oral)   Resp 18   Ht '5\' 6"'$  (1.676 m)   Wt 150 lb 12.8 oz (68.4 kg)   SpO2 98%   BMI 24.34 kg/m  BP Readings from Last 3 Encounters:  06/02/22 132/70  05/04/22 114/71  03/23/22 132/70   Wt Readings from Last 3 Encounters:  06/02/22 150 lb 12.8 oz (68.4 kg)  05/04/22 153 lb (69.4 kg)  03/23/22 154 lb (69.9 kg)   SpO2 Readings from Last 3 Encounters:  06/02/22 98%  05/04/22 95%  03/23/22 99%      Physical Exam Vitals and nursing note reviewed.  Constitutional:      Appearance: She is well-developed.  HENT:     Head: Normocephalic and atraumatic.     Nose: Rhinorrhea present.     Right Sinus: No maxillary sinus tenderness or frontal sinus tenderness.     Left Sinus: No maxillary sinus tenderness or frontal sinus tenderness.  Eyes:     Conjunctiva/sclera: Conjunctivae normal.  Neck:     Thyroid: No thyromegaly.     Vascular: No carotid bruit or JVD.  Cardiovascular:     Rate and Rhythm: Normal rate and regular rhythm.     Heart sounds: Normal heart sounds. No murmur heard. Pulmonary:     Effort:  Pulmonary effort is normal. No respiratory distress.     Breath sounds: Normal breath sounds. No wheezing or rales.  Chest:     Chest wall: No tenderness.  Musculoskeletal:     Cervical back: Normal range of motion and neck supple.  Neurological:     Mental Status: She is alert and oriented to person, place, and time.      No results found for any visits on 06/02/22.  Last CBC Lab Results  Component Value Date   WBC 7.6 01/31/2022   HGB 12.5 01/31/2022   HCT 37.9 01/31/2022   MCV 87.3 01/31/2022   MCH 29.0 04/30/2021   RDW 14.4 01/31/2022   PLT 244.0 17/40/8144   Last metabolic panel Lab Results  Component Value Date   GLUCOSE 93 01/31/2022   NA 142 01/31/2022   K 4.2 01/31/2022   CL 99 01/31/2022   CO2 29 01/31/2022   BUN 16 01/31/2022   CREATININE 0.80 01/31/2022   GFRNONAA >60 04/30/2021   CALCIUM 10.0 01/31/2022   PROT 7.1 01/31/2022   ALBUMIN 4.6 01/31/2022   BILITOT 0.3 01/31/2022   ALKPHOS 42 01/31/2022   AST 25 01/31/2022   ALT 29 01/31/2022   ANIONGAP 10 04/30/2021   Last lipids Lab Results  Component Value Date   CHOL 139 01/31/2022   HDL 44.40 01/31/2022   LDLCALC 72 01/31/2022   LDLDIRECT 87.0 06/07/2020  TRIG 112.0 01/31/2022   CHOLHDL 3 01/31/2022   Last hemoglobin A1c Lab Results  Component Value Date   HGBA1C 6.8 (H) 01/31/2022   Last thyroid functions Lab Results  Component Value Date   TSH 1.92 01/31/2022   Last vitamin D Lab Results  Component Value Date   VD25OH 43 02/23/2010   Last vitamin B12 and Folate Lab Results  Component Value Date   VITAMINB12 1,337 (H) 04/17/2019   FOLATE 6.7 09/11/2007      The 10-year ASCVD risk score (Arnett DK, et al., 2019) is: 28%    Assessment & Plan:   Problem List Items Addressed This Visit       Unprioritized   Allergic rhinitis    Astelin and xyzal or chlortrimeton Refer to allergist  Return to office as needed       Other Visit Diagnoses     Seasonal allergies     -  Primary   Relevant Medications   azelastine (ASTELIN) 0.1 % nasal spray   Other Relevant Orders   Ambulatory referral to Allergy       No follow-ups on file.    Ann Held, DO

## 2022-06-02 NOTE — Assessment & Plan Note (Signed)
Astelin and xyzal or chlortrimeton Refer to allergist  Return to office as needed

## 2022-06-05 ENCOUNTER — Other Ambulatory Visit: Payer: Self-pay | Admitting: Endocrinology

## 2022-06-05 ENCOUNTER — Other Ambulatory Visit: Payer: Self-pay | Admitting: Family Medicine

## 2022-06-05 DIAGNOSIS — E1142 Type 2 diabetes mellitus with diabetic polyneuropathy: Secondary | ICD-10-CM

## 2022-06-08 ENCOUNTER — Ambulatory Visit
Admission: RE | Admit: 2022-06-08 | Discharge: 2022-06-08 | Disposition: A | Discharge status: Home / Self Care | Payer: Medicare Other | Source: Ambulatory Visit | Attending: Acute Care | Admitting: Acute Care

## 2022-06-08 DIAGNOSIS — Z87891 Personal history of nicotine dependence: Secondary | ICD-10-CM | POA: Diagnosis not present

## 2022-06-09 ENCOUNTER — Telehealth: Payer: Self-pay | Admitting: Acute Care

## 2022-06-09 NOTE — Telephone Encounter (Signed)
Received call report from [facility] with Alyssa Ford on patient's LSCT  done on 06/08/22. Sarah,NP please review the result/impression copied below:  IMPRESSION: 1. Lung-RADS 4A, suspicious. Follow up low-dose chest CT without contrast in 3 months (please use the following order, "CT CHEST [facility] NODULE FOLLOW-UP W/O CM") is recommended. Alternatively, PET may be considered when there is a solid component 70m or larger. Irregular solid posterior right lower lobe pulmonary nodule measures 6.8 mm in volume derived mean diameter, increased from 3.0 mm on 06/08/2021 screening chest CT, suspicious for primary bronchogenic carcinoma. 2. Three-vessel coronary atherosclerosis. 3. Aortic Atherosclerosis (ICD10-I70.0) and Emphysema (ICD10-J43.9)   Please advise, thank you.   Message routed to lung nodule pool

## 2022-06-09 NOTE — Telephone Encounter (Signed)
Will address through [facility]

## 2022-06-10 DIAGNOSIS — M545 Low back pain, unspecified: Secondary | ICD-10-CM | POA: Diagnosis not present

## 2022-06-14 ENCOUNTER — Telehealth: Payer: Self-pay | Admitting: Acute Care

## 2022-06-14 DIAGNOSIS — R911 Solitary pulmonary nodule: Secondary | ICD-10-CM

## 2022-06-14 DIAGNOSIS — Z87891 Personal history of nicotine dependence: Secondary | ICD-10-CM

## 2022-06-14 NOTE — Telephone Encounter (Signed)
Called patient and she states that she is wanting to go over CT scan results. She states she got the mychart notification and she is confused.   Judson Roch please advise  CT scan was 06/08/22

## 2022-06-14 NOTE — Telephone Encounter (Signed)
Ret Alyssa Ford's call for results. Please CB at [phone]. She had phone turned off. TY

## 2022-06-14 NOTE — Telephone Encounter (Signed)
CT results faxed to PCP with follow up plans included. Order placed for 3 mth nodule f/u [facility] CT.

## 2022-06-14 NOTE — Telephone Encounter (Signed)
Patient called back. She had her phone off. I have explained that her scan was read as a 4 A due to an enlarging nodule. We will do a follow up scan in 3 months.  Langley Gauss, please order 3 month follow up scan and fax results to PCP.  I also made her aware of the CAD and aortic atherosclerosis. She is on statin therapy and is followed by cardiology.

## 2022-06-14 NOTE — Telephone Encounter (Signed)
I have attempted to call the patient with the results of their  Low Dose CT Chest Lung cancer screening scan. There was no answer. I have left a HIPPA compliant VM requesting the patient call the office for the scan results. I included the office contact information in the message. We will await his return call. If no return call we will continue to call until patient is contacted.   This is a 4 A that has grown from 3 mm to 6.8 mm in 12 months. It is too small to PET. Plan is for a 3 month follow up LDCT. I will check with Dr. Valeta Harms regarding using the [employer] blood test to guide [place] here.  She also has 3 vessel CAD and aortic atherosclerosis. She is on Crestor therapy

## 2022-06-16 ENCOUNTER — Ambulatory Visit: Payer: Medicare Other | Admitting: Internal Medicine

## 2022-06-16 ENCOUNTER — Other Ambulatory Visit: Payer: Self-pay

## 2022-06-16 ENCOUNTER — Encounter: Payer: Self-pay | Admitting: Internal Medicine

## 2022-06-16 VITALS — BP 158/80 | HR 88 | Temp 97.2°F | Resp 16 | Ht 66.0 in | Wt 148.0 lb

## 2022-06-16 DIAGNOSIS — J310 Chronic rhinitis: Secondary | ICD-10-CM

## 2022-06-16 DIAGNOSIS — K219 Gastro-esophageal reflux disease without esophagitis: Secondary | ICD-10-CM | POA: Diagnosis not present

## 2022-06-16 MED ORDER — IPRATROPIUM BROMIDE 0.06 % NA SOLN: 2.0000 | Freq: Three times a day (TID) | NASAL | 5 refills | Status: DC | PRN

## 2022-06-16 MED ORDER — FLUTICASONE PROPIONATE 50 MCG/ACT NA SUSP: 2.0000 | Freq: Every day | NASAL | 5 refills | Status: DC

## 2022-06-16 NOTE — Patient Instructions (Addendum)
Keep follow up for 06/30/2022 at 8:30 AM.  Chronic Rhinitis: - Use nasal saline rinses before nose sprays such as with Neilmed Sinus Rinse.  Use distilled water.   - Use Flonase 2 sprays each nostril daily.  Aim upward and outward. - Use Ipratroprium 2 sprays each nostril up to three times daily as needed for runny nose. Aim upward and outward.  Definitely use 2 sprays before eating.   - About 5 days prior to next visit, hold all anti histamines such as Azelastine nose spray, Claritin, Allegra, Benadryl, Zyrtec etc.  We will perform skin testing 1-59 to identify aeroallergen triggers.    GERD -Continue Prilosec (Omeprazole) '40mg'$  daily.  Can try to wean to every other day.   -Avoid lying down for at least two hours after a meal or after drinking acidic beverages, like soda, or other caffeinated beverages. This can help to prevent stomach contents from flowing back into the esophagus. -Keep your head elevated while you sleep. Using an extra pillow or two can also help to prevent reflux. -Eat smaller and more frequent meals each day instead of a few large meals. This promotes digestion and can aid in preventing heartburn. -Wear loose-fitting clothes to ease pressure on the stomach, which can worsen heartburn and reflux. -Reduce excess weight around the midsection. This can ease pressure on the stomach. Such pressure can force some stomach contents back up the esophagus.

## 2022-06-16 NOTE — Progress Notes (Signed)
NEW PATIENT  Date of Service/Encounter:  06/16/22  Consult requested by: Ann Held, DO   Subjective:   Alyssa Ford (DOB: 27-Sep-1950) is a 71 y.o. female who presents to the clinic on 06/16/2022 with a chief complaint of Establish Care and Allergy Testing (Pt c/o runny nose x 2 years more server x 2 month.) .    History obtained from: chart review and patient.  Rhinitis:  Started about 4-5 years ago.  Symptoms include: rhinorrhea and sneezing and lots of sniffling.   Moved about 4 years ago to a new house that is old and her symptoms have worsened.  She had a cold a few months ago and her symptoms are even worse with the runny nose.  She saw ENT for thyroid issues and was told to try Ipratroprium/Flonase.  It doesn't seem to help much.  PCP gave her azelastine but it did not help much either   Occurs year-round Potential triggers: not sure Treatments tried:  Claritin/Xyzal/Bendaryl PRN without relief.   Previous allergy testing: no History of reflux/heartburn: yes; she takes Prilosec '40mg'$  daily and it helps her heartburn.  History of sinus surgery: no Nonallergic triggers: eating causes worsening   GERD: She is on Prilosec '40mg'$  daily due to frequent heartburn.  Since starting the Prilosec, she has done well and does not have any symptoms.  Past Medical History: Past Medical History:  Diagnosis Date   Anemia    Anxiety    Arthritis    Cataract    Chronic headaches    Depression    Diabetes mellitus    Diverticulosis of colon (without mention of hemorrhage) 2003, 2008   Colonoscopy   Eczema    GERD (gastroesophageal reflux disease)    Glaucoma    Hiatal hernia 2003   EGD    Hyperlipidemia    Hypertension    Thyroid nodule    Past Surgical History: Past Surgical History:  Procedure Laterality Date   CARPAL TUNNEL RELEASE  2006   CARPAL TUNNEL RELEASE  12/24/2007   CATARACT EXTRACTION, BILATERAL     [place]---  sept and oct 2018   CERVICAL  FUSION  3/08   CHOLECYSTECTOMY     FASCIECTOMY Left 08/01/2018   Procedure: FASCIECTOMY LEFT RING;  Surgeon: Daryll Brod, MD;  Location: [facility];  Service: Orthopedics;  Laterality: Left;  UPPER ARM   TRIGGER FINGER RELEASE Left 08/01/2018   Procedure: RELEASE TRIGGER FINGER/A-1 PULLEY [place];  Surgeon: Daryll Brod, MD;  Location: [facility];  Service: Orthopedics;  Laterality: Left;    Family History: Family History  Problem Relation Age of Onset   Angioedema Mother    Dementia Mother    Breast cancer Mother 4   Kidney failure Mother 49       died from kidney failure from abx given to her for pneumonia   Hyperlipidemia Mother    Hypertension Mother    Diabetes Mother    Cancer Mother 61       breast   Parkinsonism Father    Diabetes Father 80   Hypertension Sister    Diabetes Brother    Hypertension Brother    Heart attack Brother    Heart disease Cousin 58       mi   Colon cancer Neg Hx    Esophageal cancer Neg Hx    Stomach cancer Neg Hx     Social History:  Lives in a 109 year house Flooring in  bedroom: wood Pets: none Tobacco use/exposure:  past history of cigarette smoking 49 pack years, maximum 1.5ppd; quit 2017 Job: retired  Medication List:  Allergies as of 06/16/2022       Reactions   Codeine Nausea Only   Severe nausea        Medication List        Accurate as of June 16, 2022 12:20 PM. If you have any questions, ask your nurse or doctor.          ALEVE PO Take 2 tablets by mouth daily.   amitriptyline 25 MG tablet Commonly known as: ELAVIL Take 2 tablets (50 mg total) by mouth daily at 12 noon. What changed: when to take this   Arexvy 120 MCG/0.5ML injection Generic drug: RSV vaccine recomb adjuvanted Inject into the muscle.   aspirin EC 81 MG tablet Take 81 mg by mouth daily.   azelastine 0.1 % nasal spray Commonly known as: ASTELIN Place 1 spray into both nostrils 2 (two) times daily. Use in  each nostril as directed   BIOTIN PO Take 1 tablet by mouth daily.   CO Q 10 PO Take 1 capsule by mouth daily.   fenofibrate 160 MG tablet Take 1 tablet (160 mg total) by mouth daily.   ipratropium 0.06 % nasal spray Commonly known as: ATROVENT Place 1 spray into both nostrils 2 (two) times daily as needed.   IRON SUPPLEMENT PO Take 1 tablet by mouth daily. '65mg'$    latanoprost 0.005 % ophthalmic solution Commonly known as: XALATAN Place 1 drop into both eyes at bedtime.   lisinopril 5 MG tablet Commonly known as: ZESTRIL Take 1 tablet (5 mg total) by mouth daily.   Melatonin 10 MG Tabs Take 1 tablet by mouth at bedtime as needed (sleep).   metFORMIN 500 MG 24 hr tablet Commonly known as: GLUCOPHAGE-XR TAKE 4 TABLETS(2000 MG) BY MOUTH DAILY WITH BREAKFAST   NONFORMULARY OR COMPOUNDED ITEM Cbd /delta 9 1 po qhs   omeprazole 40 MG capsule Commonly known as: PRILOSEC TAKE 1 CAPSULE(40 MG) BY MOUTH DAILY   OVER THE COUNTER MEDICATION Take 1 each by mouth at bedtime as needed (sleep). CBD Gummy   PROBIOTIC-10 PO Take 1 tablet by mouth daily as needed.   Quviviq 50 MG Tabs Generic drug: Daridorexant HCl 1 po qhs   repaglinide 1 MG tablet Commonly known as: PRANDIN Take 1 tablet (1 mg total) by mouth 2 (two) times daily before a meal. What changed:  how much to take when to take this additional instructions   rosuvastatin 40 MG tablet Commonly known as: CRESTOR Take 1 tablet (40 mg total) by mouth at bedtime.   Rybelsus 7 MG Tabs Generic drug: Semaglutide Take 7 mg by mouth daily.   traMADol 50 MG tablet Commonly known as: ULTRAM Take 50 mg by mouth every 6 (six) hours as needed.         REVIEW OF SYSTEMS: Pertinent positives and negatives discussed in HPI.   Objective:   Physical Exam: BP (!) 158/80 (BP Location: Left Arm, Patient Position: Sitting, Cuff Size: Normal)   Pulse 88   Temp (!) 97.2 F (36.2 C) (Temporal)   Resp 16   Ht '5\' 6"'$   (8.341 m)   Wt 148 lb (67.1 kg)   SpO2 97%   BMI 23.89 kg/m  Body mass index is 23.89 kg/m. GEN: alert, well developed HEENT: clear conjunctiva, TM grey and translucent, nose with + inferior turbinate hypertrophy, pink nasal mucosa,  slight clear rhinorrhea, + cobblestoning HEART: regular rate and rhythm, no murmur LUNGS: clear to auscultation bilaterally, no coughing, unlabored respiration ABDOMEN: soft, non distended  SKIN: no rashes or lesions  Reviewed:  06/02/2022: seen by Dr Carollee Herter for seasonal allergies and started on Azelastine/Xyzal and referred to Allergy  05/04/2022: seen by Dr. Anitra Lauth for acute bacterial sinusitis, treated with Amoxil x 7 days.   07/13/2020: seen by ENT Dr. Ilda Foil 07/13/2020 for nasal congestion. Tried course of prednisone which did help.  Switched her from Azelastine to Flonase/Atrovent.   Assessment:   1. Chronic rhinitis   2. Gastroesophageal reflux disease, unspecified whether esophagitis present     Plan/Recommendations:  Keep follow up for 06/30/2022 at 8:30 AM.  Chronic Rhinitis: - Use nasal saline rinses before nose sprays such as with Neilmed Sinus Rinse.  Use distilled water.   - Use Flonase 2 sprays each nostril daily.  Aim upward and outward. - Use Ipratroprium 2 sprays each nostril up to three times daily as needed for runny nose. Aim upward and outward.  Definitely use 2 sprays before eating.   - About 5 days prior to next visit, hold all anti histamines such as Azelastine nose spray, Claritin, Allegra, Benadryl, Zyrtec etc.  We will perform skin testing 1-59 to identify aeroallergen triggers.    GERD -Continue Prilosec (Omeprazole) '40mg'$  daily.  Can try to wean to every other day.   -Avoid lying down for at least two hours after a meal or after drinking acidic beverages, like soda, or other caffeinated beverages. This can help to prevent stomach contents from flowing back into the esophagus. -Keep your head elevated while you sleep.  Using an extra pillow or two can also help to prevent reflux. -Eat smaller and more frequent meals each day instead of a few large meals. This promotes digestion and can aid in preventing heartburn. -Wear loose-fitting clothes to ease pressure on the stomach, which can worsen heartburn and reflux. -Reduce excess weight around the midsection. This can ease pressure on the stomach. Such pressure can force some stomach contents back up the esophagus.       No follow-ups on file.  Harlon Flor, MD [facility]

## 2022-06-27 ENCOUNTER — Telehealth (INDEPENDENT_AMBULATORY_CARE_PROVIDER_SITE_OTHER): Payer: Medicare Other | Admitting: Internal Medicine

## 2022-06-27 ENCOUNTER — Encounter: Payer: Self-pay | Admitting: Internal Medicine

## 2022-06-27 DIAGNOSIS — U071 COVID-19: Secondary | ICD-10-CM

## 2022-06-27 MED ORDER — MOLNUPIRAVIR 200 MG PO CAPS: 4.0000 | ORAL_CAPSULE | Freq: Two times a day (BID) | ORAL | 0 refills | Status: AC

## 2022-06-27 MED ORDER — BENZONATATE 100 MG PO CAPS: 100.0000 mg | ORAL_CAPSULE | Freq: Three times a day (TID) | ORAL | 0 refills | Status: DC | PRN

## 2022-06-27 MED ORDER — HYDROCODONE BIT-HOMATROP MBR 5-1.5 MG/5ML PO SOLN: 5.0000 mL | Freq: Three times a day (TID) | ORAL | 0 refills | Status: DC | PRN

## 2022-06-27 NOTE — Progress Notes (Signed)
Virtual Visit via Video Note  I connected with Alyssa Ford on 06/27/22 at 10:00 AM EST by a video enabled telemedicine application and verified that I am speaking with the correct person using two identifiers.   I discussed the limitations of evaluation and management by telemedicine and the availability of in person appointments. The patient expressed understanding and agreed to proceed.  Present for the visit:  Myself, Dr Billey Gosling, Alyssa Ford.  The patient is currently at home and I am in the office.    No referring provider.    History of Present Illness: This is an acute visit for covid  She tested pos last night and this morning.  Symptoms started yesterday.  She has not had COVID in the past.  She states fatigue, congestion, runny nose, minimal sore throat, dry cough and some achiness mostly in her back.  She denies any fever, shortness of breath or wheezing.  She has not had headaches.  She states she just does not feel well.  The cough and fatigue is the most bothersome.   Has not taken anything for her symptoms.  She was not sure what to take.  Review of Systems  Constitutional:  Positive for malaise/fatigue. Negative for fever.  HENT:  Positive for congestion and sore throat (minimal). Negative for sinus pain.        Runny nose  Respiratory:  Positive for cough (dry). Negative for sputum production, shortness of breath and wheezing.   Musculoskeletal:        Back is achy  Neurological:  Negative for dizziness and headaches.    Social History   Socioeconomic History   Marital status: Married    Spouse name: Not on file   Number of children: Not on file   Years of education: Not on file   Highest education level: Not on file  Occupational History   Occupation: retired  Tobacco Use   Smoking status: Former    Packs/day: 1.40    Years: 49.00    Total pack years: 68.60    Types: Cigarettes    Quit date: 07/16/2015    Years since quitting: 6.9    Passive  exposure: Past   Smokeless tobacco: Never   Tobacco comments:    Continues to vape but states pod do not contain nicotine  Vaping Use   Vaping Use: Former  Substance and Sexual Activity   Alcohol use: Yes    Alcohol/week: 0.0 standard drinks of alcohol    Comment: once weekly; wine and beer   Drug use: No   Sexual activity: Not Currently    Partners: Male  Other Topics Concern   Not on file  Social History Narrative   Exercise--  walk   Social Determinants of Health   Financial Resource Strain: Medium Risk (12/23/2021)   Overall Financial Resource Strain (CARDIA)    Difficulty of Paying Living Expenses: Somewhat hard  Food Insecurity: No Food Insecurity (01/18/2021)   Hunger Vital Sign    Worried About Running Out of Food in the Last Year: Never true    Ran Out of Food in the Last Year: Never true  Transportation Needs: No Transportation Needs (01/18/2021)   PRAPARE - Hydrologist (Medical): No    Lack of Transportation (Non-Medical): No  Physical Activity: Insufficiently Active (12/23/2021)   Exercise Vital Sign    Days of Exercise per Week: 4 days    Minutes of Exercise per Session: 30 min  Stress: No Stress Concern Present (01/18/2021)   [place]    Feeling of Stress : Only a little  Social Connections: Moderately Isolated (01/18/2021)   Social Connection and Isolation Panel [NHANES]    Frequency of Communication with Friends and Family: More than three times a week    Frequency of Social Gatherings with Friends and Family: More than three times a week    Attends Religious Services: Never    Marine scientist or Organizations: No    Attends Archivist Meetings: Never    Marital Status: Married     Observations/Objective: Appears well in NAD Breathing normally and able to speak in full sentences. Skin appears warm and dry  Assessment and  Plan:  COVID: Acute Symptoms started yesterday-today is day 2 of symptoms Tested positive at home last night and today Discussed quarantine recommendations Discussed antivirals and she is interested in taking it-will prescribe molnupiravir 4 tabs twice daily x 5 days.  Discussed most likely side effects She would like something for the cough-will prescribe Tessalon perles, Hycodan cough syrup.  Discussed the cough syrup can cause drowsiness and to use at night Discussed that she can take over-the-counter cold medications as needed Continue increased rest and fluids Advised her to call with any questions or concerns   Follow Up Instructions:    I discussed the assessment and treatment plan with the patient. The patient was provided an opportunity to ask questions and all were answered. The patient agreed with the plan and demonstrated an understanding of the instructions.   The patient was advised to call back or seek an in-person evaluation if the symptoms worsen or if the condition fails to improve as anticipated.    Binnie Rail, MD

## 2022-06-29 NOTE — Telephone Encounter (Signed)
Received pt portion and I have Faxed Prescriber potion or PAP application on 10/25,85/27, 06/29/2022 waiting on prescriber part.   Sandre Kitty Rx Patient Advocate

## 2022-06-30 ENCOUNTER — Ambulatory Visit: Payer: Medicare Other | Admitting: Internal Medicine

## 2022-07-03 NOTE — Telephone Encounter (Signed)
RECEIVED PROVIDERS PORTION  Submitted application for RYBELSUS  to [employer] for patient assistance.   Phone: [phone]   Aristide Waggle L. CPhT Rx Patient Advocate

## 2022-07-14 ENCOUNTER — Other Ambulatory Visit: Payer: Self-pay

## 2022-07-14 ENCOUNTER — Telehealth: Payer: Medicare Other

## 2022-07-14 ENCOUNTER — Ambulatory Visit: Payer: Medicare Other | Admitting: Pharmacist

## 2022-07-14 DIAGNOSIS — E1165 Type 2 diabetes mellitus with hyperglycemia: Secondary | ICD-10-CM

## 2022-07-14 DIAGNOSIS — K219 Gastro-esophageal reflux disease without esophagitis: Secondary | ICD-10-CM

## 2022-07-14 DIAGNOSIS — I10 Essential (primary) hypertension: Secondary | ICD-10-CM

## 2022-07-14 DIAGNOSIS — G4700 Insomnia, unspecified: Secondary | ICD-10-CM

## 2022-07-14 MED ORDER — OMEPRAZOLE 20 MG PO CPDR: 20.0000 mg | DELAYED_RELEASE_CAPSULE | Freq: Every day | ORAL | 3 refills | Status: DC

## 2022-07-14 MED ORDER — REPAGLINIDE 1 MG PO TABS: 2.0000 mg | ORAL_TABLET | Freq: Every day | ORAL | 1 refills | Status: DC

## 2022-07-14 NOTE — Progress Notes (Signed)
Pharmacy Note  07/14/2022 Name: Alyssa Ford MRN: [id number] DOB: [birth year]  Subjective: Alyssa Ford is a 73 y.o. year old female who is a primary care patient of Carollee Herter, Alferd Apa, DO. Clinical Pharmacist Practitioner referral was placed to assist with medication management.    Engaged with patient by telephone for follow up visit today.  Hypertension: BP goal <130/80 - office blood pressure 06/16/2022 at allergist office was 158/80. Usually blood pressure much better. Patient checks at home 2 or 3 times per week and blood pressure usually "good" per patient. Patient was not at home to check blood pressure today. Current hypertension regimen: Lisinopril '5mg'$  daily in morning. Denies dizziness  Hyperlipidemia: LDL goal < 100 and Triglycerides <150. Taking rosuvastatin '40mg'$  daily. Diabetes: Controlled; A1c goal <7%. Saw Dr Dwyane Dee 03/23/2022 (first visit with Dr Dwyane Dee - previously was seeing Dr Loanne Drilling) Dr Dwyane Dee stopped bromocriptine and acarbose (thought was cause GI issues - nausea and bloating. Patient reports bloating has improved and appetite has improved. She continues to take Rybelsus '7mg'$  daily, Prandin '2mg'$  prior to evening meal and metformin ER '500mg'$  - take 4 tablets = '2000mg'$  daily with breakfast.  Recent home blood glucose has been 120 to 145.  Patient received letter from [employer]. She thought that it mentioned that they were discontinuing Rybelsus but she was not sure. Our Rx Team has faxed her 2024 [employer] patient assistance program application. Patient endorses that she has about 6 or 7 bottle of Rybelsus on hand at home.   Insomnia: Goal is 7 to 9 hours of restful sleep. Current therapy: over-the-counter CBD gummy - started on her own 2 months ago. She is also taking amitriptyline '50mg'$  at night and has for several years for jaw pain. It used to help with sleep but not anymore.  Previous therapy tried: Ronette Deter '50mg'$ , melatonin - not effective, trazodone - not effective.   GERD:  Taking omeprazole '40mg'$  daily for several years. Denies reflux symptoms if she avoids spicy foods. Patient states her allergist suggested she take omeprazole every other day but she as not sure why and has not made change yet. Looks like allergist just wanted her to try to see if she could get by with lower dose.   Objective: Review of patient status, including review of consultants reports, laboratory and other test data, was performed as part of comprehensive evaluation and provision of chronic care management services.   Lab Results  Component Value Date   CREATININE 0.80 01/31/2022   CREATININE 0.76 10/04/2021   CREATININE 0.80 07/04/2021    Lab Results  Component Value Date   HGBA1C 6.8 (H) 01/31/2022       Component Value Date/Time   CHOL 139 01/31/2022 1341   TRIG 112.0 01/31/2022 1341   TRIG 216 (HH) 04/24/2006 0741   HDL 44.40 01/31/2022 1341   CHOLHDL 3 01/31/2022 1341   VLDL 22.4 01/31/2022 1341   LDLCALC 72 01/31/2022 1341   LDLDIRECT 87.0 06/07/2020 0838     BP Readings from Last 3 Encounters:  06/16/22 (!) 158/80  06/02/22 132/70  05/04/22 114/71     Allergies  Allergen Reactions   Codeine Nausea Only    Severe nausea    Medications Reviewed Today     Reviewed by Cherre Robins, RPH-CPP (Pharmacist) on 07/14/22 at 1026  Med List Status: <None>   Medication Order Taking? Sig Documenting Provider Last Dose Status Informant  amitriptyline (ELAVIL) 25 MG tablet [id number] Yes Take 2 tablets (50  mg total) by mouth daily at 12 noon.  Patient taking differently: Take 50 mg by mouth at bedtime.   Ann Held, DO Taking Active            Med Note Antony Contras, Dejon Lukas B   Fri Jul 14, 2022 10:11 AM) Taking as needed  aspirin EC 81 MG tablet [id number] Yes Take 81 mg by mouth daily. Ann Held, DO Taking Active Self  azelastine (ASTELIN) 0.1 % nasal spray [id number] No Place 1 spray into both nostrils 2 (two) times daily. Use in each nostril  as directed  Patient not taking: Reported on 07/14/2022   Ann Held, DO Not Taking Active   BIOTIN PO [id number] Yes Take 1 tablet by mouth daily.  [provider] Taking Active Self  Coenzyme Q10 (CO Q 10 PO) [id number] Yes Take 1 capsule by mouth daily. [provider] Taking Active   fenofibrate 160 MG tablet [id number] Yes Take 1 tablet (160 mg total) by mouth daily. Roma Schanz R, DO Taking Active   Ferrous Sulfate (IRON SUPPLEMENT PO) [id number] Yes Take 1 tablet by mouth daily. '65mg'$  [provider] Taking Active            Med Note Antony Contras, Theodoros Stjames B   Fri Dec 23, 2021 11:27 AM) Taking every other day due to constipation  fluticasone (FLONASE) 50 MCG/ACT nasal spray [id number] Yes Place 2 sprays into both nostrils daily. Larose Kells, MD Taking Active   ipratropium (ATROVENT) 0.06 % nasal spray [id number] Yes Place 2 sprays into both nostrils 3 (three) times daily as needed for rhinitis. Larose Kells, MD Taking Active   latanoprost (XALATAN) 0.005 % ophthalmic solution [id number] Yes Place 1 drop into both eyes at bedtime.  [provider] Taking Active            Med Note Luana Shu, NATASHA   Thu Aug 19, 2020  3:42 PM)    lisinopril (ZESTRIL) 5 MG tablet [id number] Yes Take 1 tablet (5 mg total) by mouth daily. Roma Schanz R, DO Taking Active   Melatonin 10 MG TABS [id number] Yes Take 1 tablet by mouth at bedtime as needed (sleep). [provider] Taking Active   metFORMIN (GLUCOPHAGE-XR) 500 MG 24 hr tablet [id number] Yes TAKE 4 TABLETS(2000 MG) BY MOUTH DAILY WITH Payton Spark, MD Taking Active   Naproxen Sodium (ALEVE PO) [id number] Yes Take 2 tablets by mouth daily.  [provider] Taking Active Self           Med Note Dorothyann Peng, MARTHA A   Tue Jan 18, 2021  9:00 AM) Taking as needed  NONFORMULARY OR COMPOUNDED ITEM [id number]  Cbd /delta 9 1 po qhs Carollee Herter, Yvonne R, [place]  Active   omeprazole (PRILOSEC) 40  MG capsule [id number] Yes TAKE 1 CAPSULE(40 MG) BY MOUTH DAILY Ann Held, DO Taking Active   OVER THE COUNTER MEDICATION [id number] Yes Take 1 each by mouth at bedtime as needed (sleep). CBD Gummy [provider] Taking Active   repaglinide (PRANDIN) 1 MG tablet [id number] Yes Take 1 tablet (1 mg total) by mouth 2 (two) times daily before a meal.  Patient taking differently: Take 2 mg by mouth daily. Before evening meal.   Renato Shin, MD Taking Active   rosuvastatin (CRESTOR) 40 MG tablet [id number] Yes Take 1 tablet (40 mg total) by mouth at bedtime. Carollee Herter, Alferd Apa, DO Taking Active   Semaglutide [facility])  7 MG TABS [id number] Yes Take 7 mg by mouth daily. Renato Shin, MD Taking Active            Med Note [facility], Adria Dill Aug 18, 2021  3:29 PM) Approved for [employer] medication assistance program thru 05/25/2022  traMADol (ULTRAM) 50 MG tablet [id number] Yes Take 50 mg by mouth every 6 (six) hours as needed. [provider] Taking Active             Patient Active Problem List   Diagnosis Date Noted   Preventative health care 02/05/2022   Pulmonary HTN ([place]) 07/04/2021   Hyperlipidemia associated with type 2 diabetes mellitus ([place]) 07/04/2021   Atherosclerosis of aorta ([place]) 07/04/2021   Angioma of skin 06/02/2021   Vaginal itching 05/23/2021   Hematuria 05/23/2021   Insomnia 05/13/2021   Hypothyroidism 08/24/2020   Chest pain 08/06/2020   Trichimoniasis 04/27/2020   Cervical cancer screening 04/27/2020   Left thyroid nodule 04/13/2020   Vaginal odor 01/11/2020   Icterus 01/11/2020   Uncontrolled type 2 diabetes mellitus with hyperglycemia ([place]) 09/22/2019   De Quervain's disease (tenosynovitis) 09/22/2019   Neck mass 07/11/2018   Abnormal WBC count 05/29/2017   Skin fissure 09/24/2015   Allergic rhinitis 03/26/2014   Wheezing 03/26/2014   DM (diabetes mellitus) type II uncontrolled, periph vascular disorder 08/25/2011    Headache(784.0) 08/21/2011   Acute sinusitis 04/26/2010   URI 04/26/2010   UNSPECIFIED CATARACT 02/23/2010   POSTMENOPAUSAL STATUS 12/12/2007   FATIGUE 09/11/2007   THYROID NODULE 11/22/2006   Diabetes ([place]) 11/22/2006   Hyperlipidemia LDL goal <70 11/22/2006   GLUCOMA 11/22/2006   Essential hypertension 11/22/2006   GERD 11/22/2006     Medication Assistance:   Rybelsus obtained through [employer]  medication assistance program.  Enrollment ends 06/25/2022. Application for [id number] has been faxed to [employer] by [employer] - 07/03/2022. Patient endorses that she has enough Rybelsus for at least the next 4 or 5 monhts.    Assessment / Plan: Hypertension: Controlled; BP goal < 130/80 Check blood pressure 2 to 3 times per week and record for future visits Continue Lisinopril '5mg'$  daily in morning. (Room to increase if blood pressure elevated at next OV)  Hyperlipidemia: Controlled; LDL goal < 100 and Triglycerides <150.  Continue rosuvastatin '40mg'$  daily.  Diabetes: Controlled; A1c goal <7%.  Continue to follow up with Dr Dwyane Dee  Continue current regimen for diabetes - Rybelsus '7mg'$  daily, metformin ER '500mg'$  - 4 tablets daliy and Prandin '2mg'$  prior to evening meal.  1324 application for medication assistance program for Rybelsus has been faxed. I suspect that letter she received form [employer] was similar to the one other patients received with notification that Levemir will be discontinues in 2024 which won't affect Mrs. Firkus.  Recommended she check blood glucose 2 or 3 times a week after a meal.  Reviewed home blood glucose  goals  Fasting blood glucose goal (before meals) = 80 to 130 Blood glucose goal after a meal = less than 180    Insomnia: Improved Goal is 7 to 9 hours of restful sleep. Continue current over-the-counter therapy with CBD gummies and amitriptyline  GERD: Controlled. Has been taking omeprazole '40mg'$  daily for several years.  Patient would liek to try lower dose as  recommended by allergist but she would prefer to try omeprazole '20mg'$  every day instead of '40mg'$  every other day. Sent in Rx for omeprazole '20mg'$  daily.    Health Maintenance:  Discussed eye  exam. Patient has had several exams this year with Dr Eulas Post and [facility]. Called their office. No diabetic eye exam in 2023 but she has one scheduled for 06/2022.  Discussed getting DEXA - patient planning to get with mammogram in July 2024.   Follow Up:  Telephone follow up appointment with care management team member scheduled for:  2 months - check 2024 pap and BG   Cherre Robins, PharmD Clinical Pharmacist [place] [phone]

## 2022-07-21 ENCOUNTER — Telehealth: Payer: Self-pay

## 2022-07-21 ENCOUNTER — Other Ambulatory Visit (INDEPENDENT_AMBULATORY_CARE_PROVIDER_SITE_OTHER): Payer: Medicare Other

## 2022-07-21 DIAGNOSIS — E038 Other specified hypothyroidism: Secondary | ICD-10-CM | POA: Diagnosis not present

## 2022-07-21 DIAGNOSIS — E1165 Type 2 diabetes mellitus with hyperglycemia: Secondary | ICD-10-CM | POA: Diagnosis not present

## 2022-07-21 LAB — BASIC METABOLIC PANEL
BUN: 14 mg/dL (ref 6–23)
CO2: 30 mEq/L (ref 19–32)
Calcium: 8.8 mg/dL (ref 8.4–10.5)
Chloride: 100 mEq/L (ref 96–112)
Creatinine, Ser: 0.71 mg/dL (ref 0.40–1.20)
GFR: 84.99 mL/min (ref >60.00)
Glucose, Bld: 115 mg/dL — ABNORMAL HIGH (ref 70–99)
Potassium: 3.6 mEq/L (ref 3.5–5.1)
Sodium: 141 mEq/L (ref 135–145)

## 2022-07-21 LAB — HEMOGLOBIN A1C: Hgb A1c MFr Bld: 6.7 % — ABNORMAL HIGH (ref 4.6–6.5)

## 2022-07-21 LAB — T4, FREE: Free T4: 1.01 ng/dL (ref 0.60–1.60)

## 2022-07-21 LAB — TSH: TSH: 4.94 u[IU]/mL (ref 0.35–5.50)

## 2022-07-21 NOTE — Telephone Encounter (Signed)
Patient assistance received, 4 boxes of Rybelsus, patient will pick up next wk.

## 2022-07-24 NOTE — Progress Notes (Unsigned)
Patient ID: Alyssa Ford, female   DOB: 12/27/1950, 72 y.o.   MRN: [id number]           Reason for Appointment: Type II Diabetes follow-up   History of Present Illness   Diagnosis date: 2000  Previous history:  She has been on metformin likely since diagnosis Rybelsus was started in 2/22 and 7 mg since 3/22 She has been on Prandin since 2018 at least and acarbose in 6/22   A1c range in the last few years is: 5.9-7.6  Recent history:     Non-insulin hypoglycemic drugs:  metformin 2 g daily, Prandin 1 mg twice daily, Rybelsus 7 mg daily,    Side effects from medications: Bloating and mild nausea from Rybelsus or Precose  Current self management, blood sugar patterns and problems identified:  A1c is 6.7 compared to 6.8 She did bring a monitor for download but the [employer] brand meter she is using has the wrong date and time Unclear what brand of monitor she is using Also not clear which time of day her blood sugar being checked She has been on Rybelsus for couple of years and she thinks she is having symptoms of bloating, decreased desire to eat and mild nausea and constipation, this is not better with stopping precautions Weight has come down but she had recent intercurrent illness  Exercise: She has been walking for last week     Hypoglycemia:  none    Glucometer: [employer] brand     Blood Glucose readings from review of the monitor recent range 106-158   Dietician visit: Most recent:  at diagnosis time  Weight control:  Wt Readings from Last 3 Encounters:  07/25/22 143 lb (64.9 kg)  06/16/22 148 lb (67.1 kg)  06/02/22 150 lb 12.8 oz (68.4 kg)            Diabetes labs:  Lab Results  Component Value Date   HGBA1C 6.7 (H) 07/21/2022   HGBA1C 6.8 (H) 01/31/2022   HGBA1C 6.2 (A) 07/28/2021   Lab Results  Component Value Date   MICROALBUR <0.7 02/01/2022   LDLCALC 72 01/31/2022   CREATININE 0.71 07/21/2022    No results found for:  "FRUCTOSAMINE"   Allergies as of 07/25/2022       Reactions   Codeine Nausea Only   Severe nausea        Medication List        Accurate as of July 25, 2022  2:27 PM. If you have any questions, ask your nurse or doctor.          ALEVE PO Take 2 tablets by mouth daily.   amitriptyline 25 MG tablet Commonly known as: ELAVIL Take 2 tablets (50 mg total) by mouth daily at 12 noon. What changed: when to take this   aspirin EC 81 MG tablet Take 81 mg by mouth daily.   azelastine 0.1 % nasal spray Commonly known as: ASTELIN Place 1 spray into both nostrils 2 (two) times daily. Use in each nostril as directed   BIOTIN PO Take 1 tablet by mouth daily.   CO Q 10 PO Take 1 capsule by mouth daily.   fenofibrate 160 MG tablet Take 1 tablet (160 mg total) by mouth daily.   fluticasone 50 MCG/ACT nasal spray Commonly known as: FLONASE Place 2 sprays into both nostrils daily.   ipratropium 0.06 % nasal spray Commonly known as: ATROVENT Place 2 sprays into both nostrils 3 (three) times daily as needed for rhinitis.  IRON SUPPLEMENT PO Take 1 tablet by mouth daily. '65mg'$    latanoprost 0.005 % ophthalmic solution Commonly known as: XALATAN Place 1 drop into both eyes at bedtime.   lisinopril 5 MG tablet Commonly known as: ZESTRIL Take 1 tablet (5 mg total) by mouth daily.   Melatonin 10 MG Tabs Take 1 tablet by mouth at bedtime as needed (sleep).   metFORMIN 500 MG 24 hr tablet Commonly known as: GLUCOPHAGE-XR TAKE 4 TABLETS(2000 MG) BY MOUTH DAILY WITH BREAKFAST   NONFORMULARY OR COMPOUNDED ITEM Cbd /delta 9 1 po qhs   omeprazole 20 MG capsule Commonly known as: PRILOSEC Take 1 capsule (20 mg total) by mouth daily.   OVER THE COUNTER MEDICATION Take 1 each by mouth at bedtime as needed (sleep). CBD Gummy   repaglinide 1 MG tablet Commonly known as: PRANDIN Take 2 tablets (2 mg total) by mouth daily. Before evening meal.   rosuvastatin 40 MG  tablet Commonly known as: CRESTOR Take 1 tablet (40 mg total) by mouth at bedtime.   Rybelsus 7 MG Tabs Generic drug: Semaglutide Take 7 mg by mouth daily.   traMADol 50 MG tablet Commonly known as: ULTRAM Take 50 mg by mouth every 6 (six) hours as needed.        Allergies:  Allergies  Allergen Reactions   Codeine Nausea Only    Severe nausea    Past Medical History:  Diagnosis Date   Anemia    Anxiety    Arthritis    Cataract    Chronic headaches    Depression    Diabetes mellitus    Diverticulosis of colon (without mention of hemorrhage) 2003, 2008   Colonoscopy   Eczema    GERD (gastroesophageal reflux disease)    Glaucoma    Hiatal hernia 2003   EGD    Hyperlipidemia    Hypertension    Thyroid nodule     Past Surgical History:  Procedure Laterality Date   CARPAL TUNNEL RELEASE  2006   CARPAL TUNNEL RELEASE  12/24/2007   CATARACT EXTRACTION, BILATERAL     [facility]---  sept and oct 2018   CERVICAL FUSION  3/08   CHOLECYSTECTOMY     FASCIECTOMY Left 08/01/2018   Procedure: FASCIECTOMY LEFT RING;  Surgeon: Daryll Brod, MD;  Location: [facility];  Service: Orthopedics;  Laterality: Left;  UPPER ARM   TRIGGER FINGER RELEASE Left 08/01/2018   Procedure: RELEASE TRIGGER FINGER/A-1 PULLEY [place];  Surgeon: Daryll Brod, MD;  Location: [facility];  Service: Orthopedics;  Laterality: Left;    Family History  Problem Relation Age of Onset   Angioedema Mother    Dementia Mother    Breast cancer Mother 28   Kidney failure Mother 28       died from kidney failure from abx given to her for pneumonia   Hyperlipidemia Mother    Hypertension Mother    Diabetes Mother    Cancer Mother 61       breast   Parkinsonism Father    Diabetes Father 11   Hypertension Sister    Diabetes Brother    Hypertension Brother    Heart attack Brother    Heart disease Cousin 60       mi   Colon cancer Neg Hx    Esophageal cancer Neg Hx    Stomach  cancer Neg Hx     Social History:  reports that she quit smoking about 7 years ago. Her smoking use  included cigarettes. She has a 68.60 pack-year smoking history. She has been exposed to tobacco smoke. She has never used smokeless tobacco. She reports current alcohol use. She reports that she does not use drugs.  Review of Systems:  Last diabetic eye exam date 1/23  Last urine microalbumin date: 8/23  Last foot exam date:9/23  Symptoms of neuropathy: None  Hypertension:   ACE/ARB medication: Lisinopril 5 mg daily  BP Readings from Last 3 Encounters:  07/25/22 (!) 142/70  06/16/22 (!) 158/80  06/02/22 132/70    Lipid management: Managed by PCP with fenofibrate and 40 mg Crestor    Lab Results  Component Value Date   CHOL 139 01/31/2022   CHOL 109 07/04/2021   CHOL 113 02/03/2021   Lab Results  Component Value Date   HDL 44.40 01/31/2022   HDL 43.70 07/04/2021   HDL 39.50 02/03/2021   Lab Results  Component Value Date   LDLCALC 72 01/31/2022   LDLCALC 25 07/04/2021   LDLCALC 46 02/03/2021   Lab Results  Component Value Date   TRIG 112.0 01/31/2022   TRIG 197.0 (H) 07/04/2021   TRIG 139.0 02/03/2021   Lab Results  Component Value Date   CHOLHDL 3 01/31/2022   CHOLHDL 2 07/04/2021   CHOLHDL 3 02/03/2021   Lab Results  Component Value Date   LDLDIRECT 87.0 06/07/2020   LDLDIRECT 103.0 04/08/2018   LDLDIRECT 197.0 08/25/2011     Fibrosis 4 Score = 1.37       Fib-4 interpretation is not validated for people under 38 or over 29 years of age.    Thyroid: She was told to take 25 mcg of levothyroxine in 2022 elsewhere when her TSH was mildly increased However she did not feel any better with this and also is being followed by ENT physician for multinodular goiter  Now without taking levothyroxine she feels about the same and TSH is as follows:  Lab Results  Component Value Date   TSH 4.94 07/21/2022   TSH 1.92 01/31/2022   TSH 3.66 02/03/2021    FREET4 1.01 07/21/2022   FREET4 1.31 08/24/2020   FREET4 1.28 04/22/2020      Examination:   BP (!) 142/70 (BP Location: Left Arm, Patient Position: Sitting, Cuff Size: Normal)   Pulse 83   Ht '5\' 6"'$  (1.676 m)   Wt 143 lb (64.9 kg)   SpO2 97%   BMI 23.08 kg/m   Body mass index is 23.08 kg/m.     ASSESSMENT/ PLAN:    Diabetes type 2:   Current regimen:Precose 50 mg 3 times daily, metformin 2 g daily, Prandin 1 mg twice daily, Rybelsus 7 mg daily,  See history of present illness for detailed discussion of current diabetes management, blood sugar patterns and problems identified  A1c is most recently 6.7  Currently has continued GI symptoms likely to be from  Rybelsus 7 mg dose Otherwise blood sugars appear to be fairly good Recently has started back on walking also   Recommendations:  She does need to change the date and time on her monitor More readings after meals Trial of 3 mg Rybelsus to see if this is better tolerated However since she is already on the patient assistance program for Rybelsus if this is not tolerated she can switch to Ozempic, her daughter can help her with injections Continue Prandin 2 mg at dinnertime  Keep record of blood sugars  Subclinical hypothyroidism: Has no symptoms with upper normal TSH now  She  can continue to leave off the levothyroxine and we will recheck her TSH couple of times a year  Increase blood pressure: Will defer management to PCP   There are no Patient Instructions on file for this visit.   Elayne Snare 07/25/2022, 2:27 PM

## 2022-07-25 ENCOUNTER — Encounter: Payer: Self-pay | Admitting: Endocrinology

## 2022-07-25 ENCOUNTER — Ambulatory Visit: Payer: Medicare Other | Admitting: Endocrinology

## 2022-07-25 VITALS — BP 142/70 | HR 83 | Ht 66.0 in | Wt 143.0 lb

## 2022-07-25 DIAGNOSIS — E1142 Type 2 diabetes mellitus with diabetic polyneuropathy: Secondary | ICD-10-CM | POA: Diagnosis not present

## 2022-07-25 DIAGNOSIS — E038 Other specified hypothyroidism: Secondary | ICD-10-CM | POA: Diagnosis not present

## 2022-07-25 NOTE — Telephone Encounter (Signed)
Patient received PA meds during todays OV. However she was given a sample of Rybelsus 3 mg per provider. Since she is switching from '7mg'$  to 3 mg pt will need to contact [facility] directly to inquire about returning the medication.

## 2022-07-28 ENCOUNTER — Encounter: Payer: Self-pay | Admitting: Internal Medicine

## 2022-07-28 ENCOUNTER — Other Ambulatory Visit: Payer: Self-pay

## 2022-07-28 ENCOUNTER — Ambulatory Visit: Payer: Medicare Other | Admitting: Internal Medicine

## 2022-07-28 VITALS — BP 138/72 | HR 79 | Temp 97.6°F | Resp 20 | Wt 144.5 lb

## 2022-07-28 DIAGNOSIS — J310 Chronic rhinitis: Secondary | ICD-10-CM

## 2022-07-28 DIAGNOSIS — K219 Gastro-esophageal reflux disease without esophagitis: Secondary | ICD-10-CM | POA: Diagnosis not present

## 2022-07-28 DIAGNOSIS — J3089 Other allergic rhinitis: Secondary | ICD-10-CM | POA: Diagnosis not present

## 2022-07-28 MED ORDER — FLUTICASONE PROPIONATE 50 MCG/ACT NA SUSP: 2.0000 | Freq: Every day | NASAL | 5 refills | Status: DC

## 2022-07-28 MED ORDER — IPRATROPIUM BROMIDE 0.06 % NA SOLN: 2.0000 | Freq: Three times a day (TID) | NASAL | 5 refills | Status: DC | PRN

## 2022-07-28 MED ORDER — LEVOCETIRIZINE DIHYDROCHLORIDE 5 MG PO TABS: 5.0000 mg | ORAL_TABLET | Freq: Every evening | ORAL | 5 refills | Status: DC

## 2022-07-28 NOTE — Progress Notes (Signed)
FOLLOW UP Date of Service/Encounter:  07/28/22   Subjective:  Alyssa Ford (DOB: 1950/10/11) is a 72 y.o. female who returns to the [facility] on 07/28/2022 for follow up for skin testing, GERD, allergic rhinoconjunctivitis.    History obtained from: chart review and patient.  Since last visit, she reports holding all her anti histamines since last visit.  She is taking Flonase daily and Ipratroprium PRN.  Ipratroprium is helping but she still has a runny nose.   Her reflux is doing okay on Prilosec daily but she has not tried to wean it to every other day as discussed last visit.   Past Medical History: Past Medical History:  Diagnosis Date   Anemia    Anxiety    Arthritis    Cataract    Chronic headaches    Depression    Diabetes mellitus    Diverticulosis of colon (without mention of hemorrhage) 2003, 2008   Colonoscopy   Eczema    GERD (gastroesophageal reflux disease)    Glaucoma    Hiatal hernia 2003   EGD    Hyperlipidemia    Hypertension    Thyroid nodule     Objective:  BP 138/72   Pulse 79   Temp 97.6 F (36.4 C) (Temporal)   Resp 20   Wt 144 lb 8 oz (65.5 kg)   SpO2 97%   BMI 23.32 kg/m  Body mass index is 23.32 kg/m. Physical Exam: GEN: alert, well developed HEENT: clear conjunctiva, TM grey and translucent, nose with moderate inferior turbinate hypertrophy, pink nasal mucosa, clear rhinorrhea, + cobblestoning HEART: regular rate and rhythm, no murmur LUNGS: clear to auscultation bilaterally, no coughing, unlabored respiration SKIN: no rashes or lesions  Skin Testing:  Skin prick testing was placed, which includes aeroallergens/foods, histamine control, and saline control.  Verbal consent was obtained prior to placing test.  Patient tolerated procedure well.  Allergy testing results were read and interpreted by myself, documented by clinical staff. Adequate positive and negative control.  Positive results to:  Results  discussed with patient/family.  [facility] Adult Perc - 07/28/22 0926     Time Antigen Placed 0300    Allergen Manufacturer Lavella Hammock    Location Back    Number of Test 59    Panel 1 Select              Assessment:   1. Chronic rhinitis   2. Gastroesophageal reflux disease, unspecified whether esophagitis present   3. Other allergic rhinitis     Plan/Recommendations:  GERD -Continue Prilosec (Omeprazole) '40mg'$  daily.  Can try to wean to every other day.   -Avoid lying down for at least two hours after a meal or after drinking acidic beverages, like soda, or other caffeinated beverages. This can help to prevent stomach contents from flowing back into the esophagus. -Keep your head elevated while you sleep. Using an extra pillow or two can also help to prevent reflux. -Eat smaller and more frequent meals each day instead of a few large meals. This promotes digestion and can aid in preventing heartburn. -Wear loose-fitting clothes to ease pressure on the stomach, which can worsen heartburn and reflux. -Reduce excess weight around the midsection. This can ease pressure on the stomach. Such pressure can force some stomach contents back up the esophagus.   Chronic Rhinitis: - Use nasal saline rinses before nose sprays such as with Neilmed Sinus Rinse.  Use distilled water.   - Use Flonase 2 sprays  each nostril daily.  Aim upward and outward. - Use Ipratroprium 2 sprays each nostril up to three times daily as needed for runny nose. Aim upward and outward.  Definitely use 2 sprays before eating.   - Use Xyzal '5mg'$  daily as needed for runny nose, sneezing and itchy watery eyes. - Her skin testing-histamine did not react today so we will obtain bloodwork.    Return in about 2 months (around 09/26/2022).  Harlon Flor, MD [facility]

## 2022-07-28 NOTE — Patient Instructions (Addendum)
Chronic Rhinitis: - Use nasal saline rinses before nose sprays such as with Neilmed Sinus Rinse.  Use distilled water.   - Use Flonase 2 sprays each nostril daily.  Aim upward and outward. - Use Ipratroprium 2 sprays each nostril up to three times daily as needed for runny nose. Aim upward and outward.  Definitely use 2 sprays before eating.   - Use Xyzal '5mg'$  daily as needed for runny nose, sneezing and itchy watery eyes. - Her skin testing-histamine did not react today so we will obtain bloodwork.    GERD -Continue Prilosec (Omeprazole) '40mg'$  daily.  Can try to wean to every other day.   -Avoid lying down for at least two hours after a meal or after drinking acidic beverages, like soda, or other caffeinated beverages. This can help to prevent stomach contents from flowing back into the esophagus. -Keep your head elevated while you sleep. Using an extra pillow or two can also help to prevent reflux. -Eat smaller and more frequent meals each day instead of a few large meals. This promotes digestion and can aid in preventing heartburn. -Wear loose-fitting clothes to ease pressure on the stomach, which can worsen heartburn and reflux. -Reduce excess weight around the midsection. This can ease pressure on the stomach. Such pressure can force some stomach contents back up the esophagus.

## 2022-07-30 LAB — ALLERGENS W/TOTAL IGE AREA 2

## 2022-07-31 DIAGNOSIS — N390 Urinary tract infection, site not specified: Secondary | ICD-10-CM | POA: Diagnosis not present

## 2022-08-03 ENCOUNTER — Ambulatory Visit: Payer: Medicare Other | Admitting: Family Medicine

## 2022-08-04 ENCOUNTER — Telehealth: Payer: Self-pay

## 2022-08-04 NOTE — Telephone Encounter (Signed)
I called and spoke to pt about her completing PAP application I requested copy of income so I can send to company to complete app process. She did inform that she spoke to the doctor and told him that the 78m Rybelsus was making her fill sick and he said he would change the strength if the strength has been change please let me know so we can send a new script order to md to review and sign and send back to company with dose change.   Thanks you  NSelinda OrionCPhT Rx Patient Advocate

## 2022-08-07 NOTE — Telephone Encounter (Signed)
Thanks,  I will send PAP DECEASE IN DOSE provider portion to office,  so I can send to [employer].to get it change and approved   Selinda Orion CPhT Rx Patient Advocate

## 2022-08-08 NOTE — Telephone Encounter (Signed)
Thanks,  I will send PAP Decreased IN DOSE provider portion to office, so I can send to [employer].to get it change and approved    Selinda Orion CPhT Rx Patient Advocate

## 2022-08-08 NOTE — Telephone Encounter (Signed)
I spoke to patient and she informed me that the provider had decreased her dose in Rybelsus and for NOVO NORDISK I have to send in new provider orders of change dose. I am faxing to [phone] Attention to YOU can you please see that the provider receive, review and fax back to me Attention to [place] L. [phone]   Thank you Selinda Orion CPhT Rx Patient Advocate

## 2022-08-14 ENCOUNTER — Ambulatory Visit: Payer: Medicare Other | Admitting: Family Medicine

## 2022-08-15 NOTE — Telephone Encounter (Signed)
Did you receive the last fax that sent for decrease in dose. If you did did the provider Elayne Snare  sign it yet. If so can you please fax back to me. [place]. If you need me to send it again I can just let me know.  THANK YOU! Sandre Kitty Rx Patient Advocate

## 2022-08-18 ENCOUNTER — Encounter: Payer: Self-pay | Admitting: Family Medicine

## 2022-08-18 ENCOUNTER — Ambulatory Visit (INDEPENDENT_AMBULATORY_CARE_PROVIDER_SITE_OTHER): Payer: Medicare Other | Admitting: Family Medicine

## 2022-08-18 VITALS — BP 110/80 | HR 86 | Temp 98.1°F | Resp 18 | Ht 66.0 in | Wt 146.2 lb

## 2022-08-18 DIAGNOSIS — I10 Essential (primary) hypertension: Secondary | ICD-10-CM | POA: Diagnosis not present

## 2022-08-18 DIAGNOSIS — E785 Hyperlipidemia, unspecified: Secondary | ICD-10-CM | POA: Diagnosis not present

## 2022-08-18 DIAGNOSIS — K5901 Slow transit constipation: Secondary | ICD-10-CM

## 2022-08-18 DIAGNOSIS — E1169 Type 2 diabetes mellitus with other specified complication: Secondary | ICD-10-CM | POA: Diagnosis not present

## 2022-08-18 DIAGNOSIS — I700 Atherosclerosis of aorta: Secondary | ICD-10-CM | POA: Diagnosis not present

## 2022-08-18 DIAGNOSIS — E1165 Type 2 diabetes mellitus with hyperglycemia: Secondary | ICD-10-CM

## 2022-08-18 DIAGNOSIS — E2839 Other primary ovarian failure: Secondary | ICD-10-CM

## 2022-08-18 DIAGNOSIS — E039 Hypothyroidism, unspecified: Secondary | ICD-10-CM

## 2022-08-18 LAB — LIPID PANEL
Cholesterol: 154 mg/dL (ref 0–200)
HDL: 44.7 mg/dL (ref >39.00)
LDL Cholesterol: 81 mg/dL (ref 0–99)
NonHDL: 109.75
Total CHOL/HDL Ratio: 3
Triglycerides: 144 mg/dL (ref 0.0–149.0)
VLDL: 28.8 mg/dL (ref 0.0–40.0)

## 2022-08-18 LAB — CBC WITH DIFFERENTIAL/PLATELET
Basophils Absolute: 0.1 10*3/uL (ref 0.0–0.1)
Basophils Relative: 0.7 % (ref 0.0–3.0)
Eosinophils Absolute: 0.3 10*3/uL (ref 0.0–0.7)
Eosinophils Relative: 3.8 % (ref 0.0–5.0)
HCT: 39 % (ref 36.0–46.0)
Hemoglobin: 12.7 g/dL (ref 12.0–15.0)
Lymphocytes Relative: 30.8 % (ref 12.0–46.0)
Lymphs Abs: 2.6 10*3/uL (ref 0.7–4.0)
MCHC: 32.5 g/dL (ref 30.0–36.0)
MCV: 84.9 fl (ref 78.0–100.0)
Monocytes Absolute: 0.5 10*3/uL (ref 0.1–1.0)
Monocytes Relative: 5.8 % (ref 3.0–12.0)
Neutro Abs: 5 10*3/uL (ref 1.4–7.7)
Neutrophils Relative %: 58.9 % (ref 43.0–77.0)
Platelets: 382 10*3/uL (ref 150.0–400.0)
RBC: 4.59 Mil/uL (ref 3.87–5.11)
RDW: 15.2 % (ref 11.5–15.5)
WBC: 8.6 10*3/uL (ref 4.0–10.5)

## 2022-08-18 LAB — COMPREHENSIVE METABOLIC PANEL
ALT: 24 U/L (ref 0–35)
AST: 23 U/L (ref 0–37)
Albumin: 4.6 g/dL (ref 3.5–5.2)
Alkaline Phosphatase: 52 U/L (ref 39–117)
BUN: 21 mg/dL (ref 6–23)
CO2: 26 mEq/L (ref 19–32)
Calcium: 10.7 mg/dL — ABNORMAL HIGH (ref 8.4–10.5)
Chloride: 99 mEq/L (ref 96–112)
Creatinine, Ser: 0.9 mg/dL (ref 0.40–1.20)
GFR: 63.91 mL/min (ref >60.00)
Glucose, Bld: 123 mg/dL — ABNORMAL HIGH (ref 70–99)
Potassium: 4.3 mEq/L (ref 3.5–5.1)
Sodium: 135 mEq/L (ref 135–145)
Total Bilirubin: 0.4 mg/dL (ref 0.2–1.2)
Total Protein: 7.3 g/dL (ref 6.0–8.3)

## 2022-08-18 NOTE — Progress Notes (Addendum)
Subjective:   By signing my name below, I, Eugene Gavia, attest that this documentation has been prepared under the direction and in the presence of Ann Held, DO 08/18/22   Patient ID: Alyssa Ford, female    DOB: 28-Feb-1951, 72 y.o.   MRN: [id number]  Chief Complaint  Patient presents with   Hypertension   Hyperlipidemia   Diabetes   Follow-up    HPI Patient is in today for 6 month follow up.  Hypertension: She reports compliance with her Lisinopril '5mg'$  daily. Her blood pressure has been well controlled at home. BP Readings from Last 3 Encounters:  08/18/22 110/80  07/28/22 138/72  07/25/22 (!) 142/70   Pulse Readings from Last 3 Encounters:  08/18/22 86  07/28/22 79  07/25/22 83    Hyperlipidemia: She reports compliance with her Fenofibrate '160mg'$  daily and Crestor '40mg'$  daily. She reports arthralgias are well controlled with supplemental Co-Q10. Lab Results  Component Value Date   CHOL 139 01/31/2022   HDL 44.40 01/31/2022   LDLCALC 72 01/31/2022   LDLDIRECT 87.0 06/07/2020   TRIG 112.0 01/31/2022   CHOLHDL 3 01/31/2022    Diabetes: She reports compliance with her Metformin '2000mg'$  daily, Rybelsus '7mg'$  daily, and Prandin '2mg'$  daily. She states that her Rybelsus dose was lowered by her endocrinologist Dr. Dwyane Dee and last saw him on on 07/25/22. Lab Results  Component Value Date   HGBA1C 6.7 (H) 07/21/2022    H/o Glaucoma: She sees her ophthalmologist every 4-5 months.  Thyroid disease: TSH was last checked by endo Dr. Dwyane Dee. Lab Results  Component Value Date   TSH 4.94 07/21/2022    Health maintenance: Her last screening mammogram was normal on 11/11/21. She had a normal DEXA scan on 08/05/2019.  Constipation: She reports difficulty passing bowel movements x3-4 weeks. She has tried Miralax, Metamucil, and Magnesium Sulfate without much relief. She has only had 1 bowel movement in the last x5 days. Her bowel movements have not been hard but she has  felt abdominal bloating. She is trying to stay well hydrated.  Sleep disturbance: She has been taking CBD supplements intermittently with improvement of her sleep problems. However, she has occasional episodes of difficulty sleeping.  Balance issues: She reports having some mild balance issues since she began recovering from [place] in 05/2022. She reports that she walks 3 days a week and plans to increase this regimen to 5 days a week as her balance improves. She declines offer for gait training PT at this time as she is making some progress.  Past Medical History:  Diagnosis Date   Anemia    Anxiety    Arthritis    Cataract    Chronic headaches    Depression    Diabetes mellitus    Diverticulosis of colon (without mention of hemorrhage) 2003, 2008   Colonoscopy   Eczema    GERD (gastroesophageal reflux disease)    Glaucoma    Hiatal hernia 2003   EGD    Hyperlipidemia    Hypertension    Thyroid nodule     Past Surgical History:  Procedure Laterality Date   CARPAL TUNNEL RELEASE  2006   CARPAL TUNNEL RELEASE  12/24/2007   CATARACT EXTRACTION, BILATERAL     [facility]---  sept and oct 2018   CERVICAL FUSION  3/08   CHOLECYSTECTOMY     FASCIECTOMY Left 08/01/2018   Procedure: FASCIECTOMY LEFT RING;  Surgeon: Daryll Brod, MD;  Location: [facility];  Service: Orthopedics;  Laterality: Left;  UPPER ARM   TRIGGER FINGER RELEASE Left 08/01/2018   Procedure: RELEASE TRIGGER FINGER/A-1 PULLEY [place];  Surgeon: Daryll Brod, MD;  Location: [facility];  Service: Orthopedics;  Laterality: Left;    Family History  Problem Relation Age of Onset   Angioedema Mother    Dementia Mother    Breast cancer Mother 28   Kidney failure Mother 59       died from kidney failure from abx given to her for pneumonia   Hyperlipidemia Mother    Hypertension Mother    Diabetes Mother    Cancer Mother 70       breast   Parkinsonism Father    Diabetes Father 68   Hypertension  Sister    Diabetes Brother    Hypertension Brother    Heart attack Brother    Heart disease Cousin 32       mi   Colon cancer Neg Hx    Esophageal cancer Neg Hx    Stomach cancer Neg Hx     Social History   Socioeconomic History   Marital status: Married    Spouse name: Not on file   Number of children: Not on file   Years of education: Not on file   Highest education level: 12th grade  Occupational History   Occupation: retired  Tobacco Use   Smoking status: Former    Packs/day: 1.40    Years: 49.00    Total pack years: 68.60    Types: Cigarettes    Quit date: 07/16/2015    Years since quitting: 7.0    Passive exposure: Past   Smokeless tobacco: Never   Tobacco comments:    Continues to vape but states pod do not contain nicotine  Vaping Use   Vaping Use: Former  Substance and Sexual Activity   Alcohol use: Yes    Alcohol/week: 0.0 standard drinks of alcohol    Comment: once weekly; wine and beer   Drug use: No   Sexual activity: Not Currently    Partners: Male  Other Topics Concern   Not on file  Social History Narrative   Exercise--  walk   Social Determinants of Health   Financial Resource Strain: Medium Risk (12/23/2021)   Overall Financial Resource Strain (CARDIA)    Difficulty of Paying Living Expenses: Somewhat hard  Food Insecurity: No Food Insecurity (07/03/2022)   Hunger Vital Sign    Worried About Running Out of Food in the Last Year: Never true    Ran Out of Food in the Last Year: Never true  Transportation Needs: No Transportation Needs (07/03/2022)   PRAPARE - Hydrologist (Medical): No    Lack of Transportation (Non-Medical): No  Physical Activity: Insufficiently Active (07/03/2022)   Exercise Vital Sign    Days of Exercise per Week: 4 days    Minutes of Exercise per Session: 30 min  Stress: No Stress Concern Present (07/03/2022)   [place]    Feeling  of Stress : Only a little  Social Connections: Moderately Isolated (07/03/2022)   Social Connection and Isolation Panel [NHANES]    Frequency of Communication with Friends and Family: More than three times a week    Frequency of Social Gatherings with Friends and Family: Twice a week    Attends Religious Services: Never    Marine scientist or Organizations: No    Attends  Club or Organization Meetings: Not on file    Marital Status: Married  Intimate Partner Violence: Not At Risk (01/18/2021)   Humiliation, Afraid, Rape, and Kick questionnaire    Fear of Current or Ex-Partner: No    Emotionally Abused: No    Physically Abused: No    Sexually Abused: No    Outpatient Medications Prior to Visit  Medication Sig Dispense Refill   amitriptyline (ELAVIL) 25 MG tablet Take 2 tablets (50 mg total) by mouth daily at 12 noon. (Patient taking differently: Take 50 mg by mouth at bedtime.) 180 tablet 1   aspirin EC 81 MG tablet Take 81 mg by mouth daily.     azelastine (ASTELIN) 0.1 % nasal spray Place 1 spray into both nostrils 2 (two) times daily. Use in each nostril as directed 30 mL 12   BIOTIN PO Take 1 tablet by mouth daily.      Coenzyme Q10 (CO Q 10 PO) Take 1 capsule by mouth daily.     fenofibrate 160 MG tablet Take 1 tablet (160 mg total) by mouth daily. 90 tablet 1   Ferrous Sulfate (IRON SUPPLEMENT PO) Take 1 tablet by mouth daily. '65mg'$      fluticasone (FLONASE) 50 MCG/ACT nasal spray Place 2 sprays into both nostrils daily. 16 g 5   ipratropium (ATROVENT) 0.06 % nasal spray Place 2 sprays into both nostrils 3 (three) times daily as needed for rhinitis. 15 mL 5   latanoprost (XALATAN) 0.005 % ophthalmic solution Place 1 drop into both eyes at bedtime.      levocetirizine (XYZAL) 5 MG tablet Take 1 tablet (5 mg total) by mouth every evening. 30 tablet 5   lisinopril (ZESTRIL) 5 MG tablet Take 1 tablet (5 mg total) by mouth daily. 90 tablet 1   Melatonin 10 MG TABS Take 1 tablet by  mouth at bedtime as needed (sleep).     metFORMIN (GLUCOPHAGE-XR) 500 MG 24 hr tablet TAKE 4 TABLETS(2000 MG) BY MOUTH DAILY WITH BREAKFAST 360 tablet 1   Naproxen Sodium (ALEVE PO) Take 2 tablets by mouth daily.      NONFORMULARY OR COMPOUNDED ITEM Cbd /delta 9 1 po qhs 30 each 0   omeprazole (PRILOSEC) 20 MG capsule Take 1 capsule (20 mg total) by mouth daily. 90 capsule 3   OVER THE COUNTER MEDICATION Take 1 each by mouth at bedtime as needed (sleep). CBD Gummy     repaglinide (PRANDIN) 1 MG tablet Take 2 tablets (2 mg total) by mouth daily. Before evening meal. 180 tablet 1   rosuvastatin (CRESTOR) 40 MG tablet Take 1 tablet (40 mg total) by mouth at bedtime. 90 tablet 1   Semaglutide (RYBELSUS) 7 MG TABS Take 7 mg by mouth daily. 90 tablet 3   traMADol (ULTRAM) 50 MG tablet Take 50 mg by mouth every 6 (six) hours as needed. (Patient not taking: Reported on 07/28/2022)     No facility-administered medications prior to visit.    Allergies  Allergen Reactions   Codeine Nausea Only    Severe nausea    Review of Systems  Constitutional:  Negative for fever and malaise/fatigue.  HENT:  Negative for congestion, sinus pain and sore throat.   Eyes:  Negative for blurred vision.  Respiratory:  Negative for cough, shortness of breath and wheezing.   Cardiovascular:  Negative for chest pain, palpitations and leg swelling.  Gastrointestinal:  Positive for constipation (with bloating). Negative for abdominal pain, diarrhea, nausea and vomiting.  Genitourinary:  Negative for dysuria, frequency and hematuria.  Musculoskeletal:  Negative for back pain.  Skin:  Negative for rash.  Neurological:  Negative for loss of consciousness and headaches.       + balance issues  Psychiatric/Behavioral:  The patient has insomnia.        Objective:    Physical Exam Vitals and nursing note reviewed.  Constitutional:      General: She is not in acute distress.    Appearance: Normal appearance. She is not  ill-appearing.  HENT:     Head: Normocephalic and atraumatic.     Right Ear: External ear normal.     Left Ear: External ear normal.  Eyes:     Extraocular Movements: Extraocular movements intact.     Pupils: Pupils are equal, round, and reactive to light.  Cardiovascular:     Rate and Rhythm: Normal rate and regular rhythm.     Heart sounds: Normal heart sounds. No murmur heard.    No gallop.  Pulmonary:     Effort: Pulmonary effort is normal. No respiratory distress.     Breath sounds: Normal breath sounds. No wheezing or rales.  Abdominal:     Tenderness: There is no abdominal tenderness. There is no guarding or rebound.  Skin:    General: Skin is warm and dry.  Neurological:     General: No focal deficit present.     Mental Status: She is alert and oriented to person, place, and time.  Psychiatric:        Judgment: Judgment normal.     BP 110/80 (BP Location: Left Arm, Patient Position: Sitting, Cuff Size: Normal)   Pulse 86   Temp 98.1 F (36.7 C) (Oral)   Resp 18   Ht '5\' 6"'$  (1.676 m)   Wt 146 lb 3.2 oz (66.3 kg)   SpO2 98%   BMI 23.60 kg/m  Wt Readings from Last 3 Encounters:  08/18/22 146 lb 3.2 oz (66.3 kg)  07/28/22 144 lb 8 oz (65.5 kg)  07/25/22 143 lb (64.9 kg)       Assessment & Plan:  Essential hypertension -     CBC with Differential/Platelet -     Comprehensive metabolic panel  Uncontrolled type 2 diabetes mellitus with hyperglycemia (HCC)  Hyperlipidemia associated with type 2 diabetes mellitus (HCC) -     Lipid panel -     Comprehensive metabolic panel  Hypothyroidism, unspecified type  Estrogen deficiency -     DG Bone Density; Future  Slow transit constipation  Atherosclerosis of aorta [facility]) Assessment & Plan: Pt is on crestor  Check labs       I,Alexis Herring,acting as a scribe for Ann Held, DO.,have documented all relevant documentation on the behalf of Ann Held, DO,as directed by  Ann Held, DO while in the presence of Ann Held, DO.   I, Ann Held, DO, personally preformed the services described in this documentation.  All medical record entries made by the scribe were at my direction and in my presence.  I have reviewed the chart and discharge instructions (if applicable) and agree that the record reflects my personal performance and is accurate and complete. 08/18/22   Ann Held, DO

## 2022-08-18 NOTE — Assessment & Plan Note (Signed)
Pt is on crestor  Check labs

## 2022-08-18 NOTE — Telephone Encounter (Signed)
I have received pt portion and provider portion.    Submitted application for RYBELSUS to [place] for patient assistance.   Phone: [facility]

## 2022-08-18 NOTE — Assessment & Plan Note (Signed)
hgba1c to be checked, minimize simple carbs. Increase exercise as tolerated. Continue current meds  

## 2022-08-18 NOTE — Patient Instructions (Addendum)
Carbohydrate Counting for Diabetes Mellitus, Adult Carbohydrate counting is a method of keeping track of how many carbohydrates you eat. Eating carbohydrates increases the amount of sugar (glucose) in the blood. Counting how many carbohydrates you eat improves how well you manage your blood glucose. This, in turn, helps you manage your diabetes. Carbohydrates are measured in grams (g) per serving. It is important to know how many carbohydrates (in grams or by serving size) you can have in each meal. This is different for every person. A dietitian can help you make a meal plan and calculate how many carbohydrates you should have at each meal and snack. What foods contain carbohydrates? Carbohydrates are found in the following foods: Grains, such as breads and cereals. Dried beans and soy products. Starchy vegetables, such as potatoes, peas, and corn. Fruit and fruit juices. Milk and yogurt. Sweets and snack foods, such as cake, cookies, candy, chips, and soft drinks. How do I count carbohydrates in foods? There are two ways to count carbohydrates in food. You can read food labels or learn standard serving sizes of foods. You can use either of these methods or a combination of both. Using the Nutrition Facts label The Nutrition Facts list is included on the labels of almost all packaged foods and beverages in the [place]. It includes: The serving size. Information about nutrients in each serving, including the grams of carbohydrate per serving. To use the Nutrition Facts, decide how many servings you will have. Then, multiply the number of servings by the number of carbohydrates per serving. The resulting number is the total grams of carbohydrates that you will be having. Learning the standard serving sizes of foods When you eat carbohydrate foods that are not packaged or do not include Nutrition Facts on the label, you need to measure the servings in order to count the grams of  carbohydrates. Measure the foods that you will eat with a food scale or measuring cup, if needed. Decide how many standard-size servings you will eat. Multiply the number of servings by 15. For foods that contain carbohydrates, one serving equals 15 g of carbohydrates. For example, if you eat 2 cups or 10 oz (300 g) of strawberries, you will have eaten 2 servings and 30 g of carbohydrates (2 servings x 15 g = 30 g). For foods that have more than one food mixed, such as soups and casseroles, you must count the carbohydrates in each food that is included. The following list contains standard serving sizes of common carbohydrate-rich foods. Each of these servings has about 15 g of carbohydrates: 1 slice of bread. 1 six-inch (15 cm) tortilla. ? cup or 2 oz (53 g) cooked rice or pasta.  cup or 3 oz (85 g) cooked or canned, drained and rinsed beans or lentils.  cup or 3 oz (85 g) starchy vegetable, such as peas, corn, or squash.  cup or 4 oz (120 g) hot cereal.  cup or 3 oz (85 g) boiled or mashed potatoes, or  or 3 oz (85 g) of a large baked potato.  cup or 4 fl oz (118 mL) fruit juice. 1 cup or 8 fl oz (237 mL) milk. 1 small or 4 oz (106 g) apple.  or 2 oz (63 g) of a medium banana. 1 cup or 5 oz (150 g) strawberries. 3 cups or 1 oz (28.3 g) popped popcorn. What is an example of carbohydrate counting? To calculate the grams of carbohydrates in this sample meal, follow the steps  shown below. Sample meal 3 oz (85 g) chicken breast. ? cup or 4 oz (106 g) brown rice.  cup or 3 oz (85 g) corn. 1 cup or 8 fl oz (237 mL) milk. 1 cup or 5 oz (150 g) strawberries with sugar-free whipped topping. Carbohydrate calculation Identify the foods that contain carbohydrates: Rice. Corn. Milk. Strawberries. Calculate how many servings you have of each food: 2 servings rice. 1 serving corn. 1 serving milk. 1 serving strawberries. Multiply each number of servings by 15 g: 2 servings rice x 15  g = 30 g. 1 serving corn x 15 g = 15 g. 1 serving milk x 15 g = 15 g. 1 serving strawberries x 15 g = 15 g. Add together all of the amounts to find the total grams of carbohydrates eaten: 30 g + 15 g + 15 g + 15 g = 75 g of carbohydrates total. What are tips for following this plan? Shopping Develop a meal plan and then make a shopping list. Buy fresh and frozen vegetables, fresh and frozen fruit, dairy, eggs, beans, lentils, and whole grains. Look at food labels. Choose foods that have more fiber and less sugar. Avoid processed foods and foods with added sugars. Meal planning Aim to have the same number of grams of carbohydrates at each meal and for each snack time. Plan to have regular, balanced meals and snacks. Where to find more information American Diabetes Association: diabetes.org Centers for Disease Control and Prevention: StoreMirror.com.[place] Academy of Nutrition and Dietetics: eatright.org Association of Diabetes Care & Education Specialists: diabeteseducator.org Summary Carbohydrate counting is a method of keeping track of how many carbohydrates you eat. Eating carbohydrates increases the amount of sugar (glucose) in your blood. Counting how many carbohydrates you eat improves how well you manage your blood glucose. This helps you manage your diabetes. A dietitian can help you make a meal plan and calculate how many carbohydrates you should have at each meal and snack. This information is not intended to replace advice given to you by your health care provider. Make sure you discuss any questions you have with your health care provider. Document Revised: 01/14/2020 Document Reviewed: 01/14/2020 Elsevier Patient Education  [place]. Constipation, Adult Constipation is when a person has fewer than three bowel movements in a week, has difficulty having a bowel movement, or has stools (feces) that are dry, hard, or larger than normal. Constipation may be caused by an underlying  condition. It may become worse with age if a person takes certain medicines and does not take in enough fluids. Follow these instructions at home: Eating and drinking  Eat foods that have a lot of fiber, such as beans, whole grains, and fresh fruits and vegetables. Limit foods that are low in fiber and high in fat and processed sugars, such as fried or sweet foods. These include french fries, hamburgers, cookies, candies, and soda. Drink enough fluid to keep your urine pale yellow. General instructions Exercise regularly or as told by your health care provider. Try to do 150 minutes of moderate exercise each week. Use the bathroom when you have the urge to go. Do not hold it in. Take over-the-counter and prescription medicines only as told by your health care provider. This includes any fiber supplements. During bowel movements: Practice deep breathing while relaxing the lower abdomen. Practice pelvic floor relaxation. Watch your condition for any changes. Let your health care provider know about them. Keep all follow-up visits as told by your health  care provider. This is important. Contact a health care provider if: You have pain that gets worse. You have a fever. You do not have a bowel movement after 4 days. You vomit. You are not hungry or you lose weight. You are bleeding from the opening between the buttocks (anus). You have thin, pencil-like stools. Get help right away if: You have a fever and your symptoms suddenly get worse. You leak stool or have blood in your stool. Your abdomen is bloated. You have severe pain in your abdomen. You feel dizzy or you faint. Summary Constipation is when a person has fewer than three bowel movements in a week, has difficulty having a bowel movement, or has stools (feces) that are dry, hard, or larger than normal. Eat foods that have a lot of fiber, such as beans, whole grains, and fresh fruits and vegetables. Drink enough fluid to keep your  urine pale yellow. Take over-the-counter and prescription medicines only as told by your health care provider. This includes any fiber supplements. This information is not intended to replace advice given to you by your health care provider. Make sure you discuss any questions you have with your health care provider. Document Revised: 04/26/2022 Document Reviewed: 04/26/2022 Elsevier Patient Education  [place].

## 2022-08-23 ENCOUNTER — Other Ambulatory Visit: Payer: Self-pay | Admitting: Family Medicine

## 2022-08-23 ENCOUNTER — Telehealth: Payer: Self-pay | Admitting: Endocrinology

## 2022-08-23 DIAGNOSIS — Z1231 Encounter for screening mammogram for malignant neoplasm of breast: Secondary | ICD-10-CM

## 2022-08-23 DIAGNOSIS — E1142 Type 2 diabetes mellitus with diabetic polyneuropathy: Secondary | ICD-10-CM

## 2022-08-23 MED ORDER — RYBELSUS 3 MG PO TABS: ORAL_TABLET | ORAL | 3 refills | Status: DC

## 2022-08-23 NOTE — Telephone Encounter (Signed)
RX sent  

## 2022-08-23 NOTE — Telephone Encounter (Signed)
New message   Patient is asking for prescription Semaglutide (RYBELSUS) 7 MG TABS patient is saying Dr. Dwyane Dee decrease to  3 mg    Pharmacy [facility] on [address]    Patient calling the office for samples of medication:  1.  What medication and dosage are you requesting samples for? Rybelsus  3 mg   2.  Are you currently out of this medication? Yes

## 2022-08-30 DIAGNOSIS — E119 Type 2 diabetes mellitus without complications: Secondary | ICD-10-CM | POA: Diagnosis not present

## 2022-08-30 DIAGNOSIS — Z961 Presence of intraocular lens: Secondary | ICD-10-CM | POA: Diagnosis not present

## 2022-08-30 DIAGNOSIS — H401131 Primary open-angle glaucoma, bilateral, mild stage: Secondary | ICD-10-CM | POA: Diagnosis not present

## 2022-08-30 LAB — HM DIABETES EYE EXAM

## 2022-09-04 ENCOUNTER — Telehealth: Payer: Self-pay | Admitting: Family Medicine

## 2022-09-04 NOTE — Telephone Encounter (Signed)
Pt called wanting to speak to Tammy regarding her Rybelsus. Pt stated she received a letter stating that she wouldn't be covered under the assistance program under the manufacturer and wanted to discuss this with Tammy. Advised a note would be sent back to give her a call back.

## 2022-09-04 NOTE — Telephone Encounter (Signed)
[place] regarding 2024 patient assistance program status as our records showed patient was approved thru 06/26/2023. Representative explained that they will only supply Rybelsus '3mg'$  for 30 days, then patient is expected to increase to '7mg'$  daily. Explained that patient was not able to tolerate '7mg'$  daily due to GI issues. [employer] representative states they were able to override in 2023 but they could no longer do that.  Patient will only received 30 days of Rybelsus '3mg'$  - fill is in progress.  Checked Medicare.gov and estimate that patient would reach coverage gap after filling Rybelsus about 5 or 6 times at $47 / month. Then cost would be $250 per month. She states she could afford the $47 per month but that $250 was too much.  She has 1 month supply and will received another month supply form [employer]. I provided #30 Rybelsus '3mg'$  tablets.  Patient is hoping to see new endocrinologist around April 30th and have A1c checked.  Will follow up after April to see if any med change and what next steps are for medication assistance.

## 2022-09-04 NOTE — Telephone Encounter (Signed)
Received notification from [place] regarding approval for RYBELSUS. Patient assistance approved to 06/26/2023.  Phone: [facility] [phone]

## 2022-09-05 NOTE — Telephone Encounter (Signed)
Thanks!  [facility] [phone]

## 2022-09-07 ENCOUNTER — Ambulatory Visit
Admission: RE | Admit: 2022-09-07 | Discharge: 2022-09-07 | Disposition: A | Discharge status: Home / Self Care | Payer: Medicare Other | Source: Ambulatory Visit | Attending: Acute Care | Admitting: Acute Care

## 2022-09-07 DIAGNOSIS — J439 Emphysema, unspecified: Secondary | ICD-10-CM | POA: Diagnosis not present

## 2022-09-07 DIAGNOSIS — Z87891 Personal history of nicotine dependence: Secondary | ICD-10-CM

## 2022-09-07 DIAGNOSIS — I700 Atherosclerosis of aorta: Secondary | ICD-10-CM | POA: Diagnosis not present

## 2022-09-07 DIAGNOSIS — R911 Solitary pulmonary nodule: Secondary | ICD-10-CM

## 2022-09-12 ENCOUNTER — Telehealth: Payer: Self-pay | Admitting: Acute Care

## 2022-09-12 ENCOUNTER — Other Ambulatory Visit: Payer: Self-pay

## 2022-09-12 DIAGNOSIS — R911 Solitary pulmonary nodule: Secondary | ICD-10-CM

## 2022-09-12 DIAGNOSIS — Z87891 Personal history of nicotine dependence: Secondary | ICD-10-CM

## 2022-09-12 NOTE — Telephone Encounter (Signed)
Spoke with patient, using two patient identifiers, to review the results of recent LDCT.  Recommendation to repeat LDCT in 6 months.  Nodule of interest from previous scan has decreased in size.  It is solid appearing and as precaution, would like to image it again in 6 months.  Patient is in agreement.  Order placed for 6 months nodule follow up LDCT and results/plan faxed to PCP.

## 2022-09-14 ENCOUNTER — Telehealth: Payer: Self-pay

## 2022-09-14 NOTE — Telephone Encounter (Signed)
LVM informing pt we have received Rybelsus through patient assistance and is ready for pickup.

## 2022-09-14 NOTE — Telephone Encounter (Signed)
Patient picked up 1 box Rybelsus from patient assistance - log noted

## 2022-10-19 ENCOUNTER — Telehealth: Payer: Self-pay

## 2022-10-19 ENCOUNTER — Other Ambulatory Visit (INDEPENDENT_AMBULATORY_CARE_PROVIDER_SITE_OTHER): Payer: Medicare Other

## 2022-10-19 DIAGNOSIS — E1142 Type 2 diabetes mellitus with diabetic polyneuropathy: Secondary | ICD-10-CM | POA: Diagnosis not present

## 2022-10-19 DIAGNOSIS — E038 Other specified hypothyroidism: Secondary | ICD-10-CM

## 2022-10-19 DIAGNOSIS — Z7984 Long term (current) use of oral hypoglycemic drugs: Secondary | ICD-10-CM | POA: Diagnosis not present

## 2022-10-19 LAB — HEMOGLOBIN A1C: Hgb A1c MFr Bld: 6.5 % (ref 4.6–6.5)

## 2022-10-19 LAB — BASIC METABOLIC PANEL
BUN: 16 mg/dL (ref 6–23)
CO2: 30 mEq/L (ref 19–32)
Calcium: 10.3 mg/dL (ref 8.4–10.5)
Chloride: 96 mEq/L (ref 96–112)
Creatinine, Ser: 0.91 mg/dL (ref 0.40–1.20)
GFR: 62.99 mL/min (ref >60.00)
Glucose, Bld: 142 mg/dL — ABNORMAL HIGH (ref 70–99)
Potassium: 4.3 mEq/L (ref 3.5–5.1)
Sodium: 135 mEq/L (ref 135–145)

## 2022-10-19 LAB — TSH: TSH: 3.04 u[IU]/mL (ref 0.35–5.50)

## 2022-10-19 NOTE — Telephone Encounter (Signed)
Pt advised patient assistance delivered and ready for pick up.  Rybelsus: 1 box

## 2022-10-24 ENCOUNTER — Ambulatory Visit: Payer: Medicare Other | Admitting: "Endocrinology

## 2022-10-24 ENCOUNTER — Encounter: Payer: Self-pay | Admitting: "Endocrinology

## 2022-10-24 VITALS — BP 128/80 | HR 73 | Ht 66.0 in | Wt 147.4 lb

## 2022-10-24 DIAGNOSIS — E7800 Pure hypercholesterolemia, unspecified: Secondary | ICD-10-CM | POA: Diagnosis not present

## 2022-10-24 DIAGNOSIS — E119 Type 2 diabetes mellitus without complications: Secondary | ICD-10-CM

## 2022-10-24 MED ORDER — RYBELSUS 3 MG PO TABS: ORAL_TABLET | ORAL | 1 refills | Status: DC

## 2022-10-24 MED ORDER — METFORMIN HCL ER 500 MG PO TB24
ORAL_TABLET | ORAL | 0 refills | Status: DC
Start: 2022-10-24 — End: 2023-03-19

## 2022-10-24 MED ORDER — REPAGLINIDE 1 MG PO TABS: 2.0000 mg | ORAL_TABLET | Freq: Every day | ORAL | 0 refills | Status: DC

## 2022-10-24 NOTE — Telephone Encounter (Signed)
Pt picked up patient assistance. 

## 2022-10-24 NOTE — Progress Notes (Signed)
Outpatient Endocrinology Note  Alyssa Ford 05/29/1951 [id number]   Referring Provider: [name], Grayling Congress, * Primary Care Provider: [name], Grayling Congress, DO Reason for consultation: Subjective  Type 2 diabetes mellitus  Assessment & Plan  Alyssa Ford was seen today for follow-up.  Diagnoses and all orders for this visit:  Controlled type 2 diabetes mellitus without complication, without long-term current use of insulin (HCC) -     repaglinide (PRANDIN) 1 MG tablet; Take 2 tablets (2 mg total) by mouth daily. Before evening meal. -     metFORMIN (GLUCOPHAGE-XR) 500 MG 24 hr tablet; Take 4 pills every day -     Semaglutide (RYBELSUS) 3 MG TABS; 1 tablet daily  Pure hypercholesterolemia    Diabetes with no complications Hba1c goal less than 7.0, current Hba1c is 6.5. Will recommend for the following change of medications to: metformin 2 g daily, Prandin 1 mg twice daily, Rybelsus 3 mg daily, Patient feels better on rybelsus 3 mg instead of 7 mg  Uptodate on annual exams  No known contraindications to any of above medications No known history of medullary thyroid cancer/MEN syndrome     Hyperlipidemia -Last LDL near goal: 56 -on  rosuvastatin 40 mg QD -Follow low fat diet and exercise   -Blood pressure goal <140/90 - Microalbumin/creatinine goal < 30 -On lisinopril -diet changes including salt restriction -limit eating outside -counseled BP targets per standards of diabetes care -Uncontrolled blood pressure can lead to retinopathy, nephropathy and cardiovascular and atherosclerotic heart disease  Reviewed and counseled on: -A1C target -Blood sugar targets -Complications of uncontrolled diabetes  -Checking blood sugar before meals and bedtime and bring log next visit -All medications with mechanism of action and side effects -Hypoglycemia management: rule of 15's, Glucagon Emergency Kit and medical alert ID -low-carb low-fat plate-method diet -At least  20 minutes of physical activity per day -Annual dilated retinal eye exam and foot exam -compliance and follow up needs -follow up as scheduled or earlier if problem gets worse  Call if blood sugar is less than 70 or consistently above 250    Take a 15 gm snack of carbohydrate at bedtime before you go to sleep if your blood sugar is less than 100.    If you are going to fast after midnight for a test or procedure, ask your physician for instructions on how to reduce/decrease your insulin dose.    Call if blood sugar is less than 70 or consistently above 250  -Treating a low sugar by rule of 15  (15 gms of sugar every 15 min until sugar is more than 70) If you feel your sugar is low, test your sugar to be sure If your sugar is low (less than 70), then take 15 grams of a fast acting Carbohydrate (3-4 glucose tablets or glucose gel or 4 ounces of juice or regular soda) Recheck your sugar 15 min after treating low to make sure it is more than 70 If sugar is still less than 70, treat again with 15 grams of carbohydrate          Don't drive the hour of hypoglycemia  If unconscious/unable to eat or drink by mouth, use glucagon injection or nasal spray baqsimi and call 911. Can repeat again in 15 min if still unconscious.  Patient to follow up with PCP  No follow-ups on file.   I spent more than 50% of today's visit counseling patient on symptoms, examination findings, lab findings, imaging  results, treatment decisions and monitoring and prognosis. The patient understood the recommendations and agrees with the treatment plan. All questions regarding treatment plan were fully answered  Altamese Hialeah, MD    History of Present Illness Alyssa Ford is a 72 y.o. year old female who presents for a new referral for Type 2 diabetes mellitus.  Alyssa Ford was first diagnosed in 2000.   Diabetes education +  Home diabetes regimen:  Current regimen: Non-insulin hypoglycemic drugs:  metformin 2 g daily, Prandin 1 mg twice daily, Rybelsus 3 mg daily,    She has been on metformin likely since diagnosis Rybelsus was started in 2/22 and 7 mg since 3/22 She has been on Prandin since 2018 at least and acarbose in 6/22 Reports acarbose was stopped  Side effects from medications: Bloating and mild nausea from Rybelsus 7 mg or Precose   COMPLICATIONS -  MI/Stroke -  retinopathy, last eye exam 2024 -  neuropathy, last foot exam 2023 -  nephropathy  BLOOD SUGAR DATA 109-203 per meter  Physical Exam  BP 128/80 (BP Location: Right Arm, Patient Position: Sitting, Cuff Size: Normal)   Pulse 73   Ht 5\' 6"  (1.676 m)   Wt 147 lb 6.4 oz (66.9 kg)   SpO2 98%   BMI 23.79 kg/m    Constitutional: well developed, well nourished Head: normocephalic, atraumatic Eyes: sclera anicteric, no redness Neck: supple Lungs: normal respiratory effort Neurology: alert and oriented Skin: dry, no appreciable rashes Musculoskeletal: no appreciable defects Psychiatric: normal mood and affect   Current Medications Patient's Medications  New Prescriptions   No medications on file  Previous Medications   AMITRIPTYLINE (ELAVIL) 25 MG TABLET    Take 2 tablets (50 mg total) by mouth daily at 12 noon.   ASPIRIN EC 81 MG TABLET    Take 81 mg by mouth daily.   AZELASTINE (ASTELIN) 0.1 % NASAL SPRAY    Place 1 spray into both nostrils 2 (two) times daily. Use in each nostril as directed   BIOTIN PO    Take 1 tablet by mouth daily.    COENZYME Q10 (CO Q 10 PO)    Take 1 capsule by mouth daily.   FENOFIBRATE 160 MG TABLET    Take 1 tablet (160 mg total) by mouth daily.   FERROUS SULFATE (IRON SUPPLEMENT PO)    Take 1 tablet by mouth daily. 65mg    FLUTICASONE (FLONASE) 50 MCG/ACT NASAL SPRAY    Place 2 sprays into both nostrils daily.   IPRATROPIUM (ATROVENT) 0.06 % NASAL SPRAY    Place 2 sprays into both nostrils 3 (three) times daily as needed for rhinitis.   LATANOPROST (XALATAN) 0.005 %  OPHTHALMIC SOLUTION    Place 1 drop into both eyes at bedtime.    LEVOCETIRIZINE (XYZAL) 5 MG TABLET    Take 1 tablet (5 mg total) by mouth every evening.   LISINOPRIL (ZESTRIL) 5 MG TABLET    Take 1 tablet (5 mg total) by mouth daily.   MELATONIN 10 MG TABS    Take 1 tablet by mouth at bedtime as needed (sleep).   NAPROXEN SODIUM (ALEVE PO)    Take 2 tablets by mouth daily.    NONFORMULARY OR COMPOUNDED ITEM    Cbd /delta 9 1 po qhs   OMEPRAZOLE (PRILOSEC) 20 MG CAPSULE    Take 1 capsule (20 mg total) by mouth daily.   OVER THE COUNTER MEDICATION    Take 1 each by mouth at  bedtime as needed (sleep). CBD Gummy   ROSUVASTATIN (CRESTOR) 40 MG TABLET    Take 1 tablet (40 mg total) by mouth at bedtime.  Modified Medications   Modified Medication Previous Medication   METFORMIN (GLUCOPHAGE-XR) 500 MG 24 HR TABLET metFORMIN (GLUCOPHAGE-XR) 500 MG 24 hr tablet      Take 4 pills every day    TAKE 4 TABLETS(2000 MG) BY MOUTH DAILY WITH BREAKFAST   REPAGLINIDE (PRANDIN) 1 MG TABLET repaglinide (PRANDIN) 1 MG tablet      Take 2 tablets (2 mg total) by mouth daily. Before evening meal.    Take 2 tablets (2 mg total) by mouth daily. Before evening meal.   SEMAGLUTIDE (RYBELSUS) 3 MG TABS Semaglutide (RYBELSUS) 3 MG TABS      1 tablet daily    1 tablet daily  Discontinued Medications   ACARBOSE (PRECOSE) 50 MG TABLET        Allergies Allergies  Allergen Reactions   Codeine Nausea Only    Severe nausea    Past Medical History Past Medical History:  Diagnosis Date   Anemia    Anxiety    Arthritis    Cataract    Chronic headaches    Depression    Diabetes mellitus    Diverticulosis of colon (without mention of hemorrhage) 2003, 2008   Colonoscopy   Eczema    GERD (gastroesophageal reflux disease)    Glaucoma    Hiatal hernia 2003   EGD    Hyperlipidemia    Hypertension    Thyroid nodule     Past Surgical History Past Surgical History:  Procedure Laterality Date   CARPAL TUNNEL  RELEASE  2006   CARPAL TUNNEL RELEASE  12/24/2007   CATARACT EXTRACTION, BILATERAL     [facility]---  sept and oct 2018   CERVICAL FUSION  3/08   CHOLECYSTECTOMY     FASCIECTOMY Left 08/01/2018   Procedure: FASCIECTOMY LEFT RING;  Surgeon: Cindee Salt, MD;  Location: [facility];  Service: Orthopedics;  Laterality: Left;  UPPER ARM   TRIGGER FINGER RELEASE Left 08/01/2018   Procedure: RELEASE TRIGGER FINGER/A-1 PULLEY LEFTRING;  Surgeon: Cindee Salt, MD;  Location: [facility];  Service: Orthopedics;  Laterality: Left;    Family History family history includes Angioedema in her mother; Breast cancer (age of onset: 8) in her mother; Cancer (age of onset: 63) in her mother; Dementia in her mother; Diabetes in her brother and mother; Diabetes (age of onset: 2) in her father; Heart attack in her brother; Heart disease (age of onset: 1) in her cousin; Hyperlipidemia in her mother; Hypertension in her brother, mother, and sister; Kidney failure (age of onset: 25) in her mother; Parkinsonism in her father.  Social History Social History   Socioeconomic History   Marital status: Married    Spouse name: Not on file   Number of children: Not on file   Years of education: Not on file   Highest education level: 12th grade  Occupational History   Occupation: retired  Tobacco Use   Smoking status: Former    Packs/day: 1.40    Years: 49.00    Additional pack years: 0.00    Total pack years: 68.60    Types: Cigarettes    Quit date: 07/16/2015    Years since quitting: 7.2    Passive exposure: Past   Smokeless tobacco: Never   Tobacco comments:    Continues to vape but states pod do not contain nicotine  Vaping Use   Vaping Use: Former  Substance and Sexual Activity   Alcohol use: Yes    Alcohol/week: 0.0 standard drinks of alcohol    Comment: once weekly; wine and beer   Drug use: No   Sexual activity: Not Currently    Partners: Male  Other Topics Concern   Not on  file  Social History Narrative   Exercise--  walk   Social Determinants of Health   Financial Resource Strain: Medium Risk (12/23/2021)   Overall Financial Resource Strain (CARDIA)    Difficulty of Paying Living Expenses: Somewhat hard  Food Insecurity: No Food Insecurity (07/03/2022)   Hunger Vital Sign    Worried About Running Out of Food in the Last Year: Never true    Ran Out of Food in the Last Year: Never true  Transportation Needs: No Transportation Needs (07/03/2022)   PRAPARE - Administrator, Civil Service (Medical): No    Lack of Transportation (Non-Medical): No  Physical Activity: Insufficiently Active (07/03/2022)   Exercise Vital Sign    Days of Exercise per Week: 4 days    Minutes of Exercise per Session: 30 min  Stress: No Stress Concern Present (07/03/2022)   [employer] of Occupational Health - Occupational Stress Questionnaire    Feeling of Stress : Only a little  Social Connections: Moderately Isolated (07/03/2022)   Social Connection and Isolation Panel [NHANES]    Frequency of Communication with Friends and Family: More than three times a week    Frequency of Social Gatherings with Friends and Family: Twice a week    Attends Religious Services: Never    Database administrator or Organizations: No    Attends Engineer, structural: Not on file    Marital Status: Married  Intimate Partner Violence: Not At Risk (01/18/2021)   Humiliation, Afraid, Rape, and Kick questionnaire    Fear of Current or Ex-Partner: No    Emotionally Abused: No    Physically Abused: No    Sexually Abused: No    Lab Results  Component Value Date   HGBA1C 6.5 10/19/2022   Lab Results  Component Value Date   CHOL 154 08/18/2022   Lab Results  Component Value Date   HDL 44.70 08/18/2022   Lab Results  Component Value Date   LDLCALC 81 08/18/2022   Lab Results  Component Value Date   TRIG 144.0 08/18/2022   Lab Results  Component Value Date   CHOLHDL 3  08/18/2022   Lab Results  Component Value Date   CREATININE 0.91 10/19/2022   Lab Results  Component Value Date   GFR 62.99 10/19/2022   Lab Results  Component Value Date   MICROALBUR <0.7 02/01/2022   Lab Results  Component Value Date   CREATININE 0.91 10/19/2022       Component Value Date/Time   NA 135 10/19/2022 0937   K 4.3 10/19/2022 0937   CL 96 10/19/2022 0937   CO2 30 10/19/2022 0937   GLUCOSE 142 (H) 10/19/2022 0937   GLUCOSE 143 (H) 04/24/2006 0741   BUN 16 10/19/2022 0937   CREATININE 0.91 10/19/2022 0937   CALCIUM 10.3 10/19/2022 0937   PROT 7.3 08/18/2022 1007   ALBUMIN 4.6 08/18/2022 1007   AST 23 08/18/2022 1007   ALT 24 08/18/2022 1007   ALKPHOS 52 08/18/2022 1007   BILITOT 0.4 08/18/2022 1007   GFRNONAA >60 04/30/2021 1852   GFRAA  12/23/2007 1355    >60  The eGFR has been calculated using the MDRD equation. This calculation has not been validated in all clinical      Latest Ref Rng & Units 10/19/2022    9:37 AM 08/18/2022   10:07 AM 07/21/2022    8:00 AM  BMP  Glucose 70 - 99 mg/dL 478  295  621   BUN 6 - 23 mg/dL 16  21  14    Creatinine 0.40 - 1.20 mg/dL 3.08  6.57  8.46   Sodium 135 - 145 mEq/L 135  135  141   Potassium 3.5 - 5.1 mEq/L 4.3  4.3  3.6   Chloride 96 - 112 mEq/L 96  99  100   CO2 19 - 32 mEq/L 30  26  30    Calcium 8.4 - 10.5 mg/dL 96.2  95.2  8.8      Parts of this note may have been dictated using voice recognition software. There may be variances in spelling and vocabulary which are unintentional. Not all errors are proofread. Please notify the Thereasa Parkin if any discrepancies are noted or if the meaning of any statement is not clear.

## 2022-11-12 ENCOUNTER — Other Ambulatory Visit: Payer: Self-pay | Admitting: Family Medicine

## 2022-11-12 DIAGNOSIS — E118 Type 2 diabetes mellitus with unspecified complications: Secondary | ICD-10-CM

## 2022-11-13 ENCOUNTER — Ambulatory Visit
Admission: RE | Admit: 2022-11-13 | Discharge: 2022-11-13 | Disposition: A | Discharge status: Home / Self Care | Payer: Medicare Other | Source: Ambulatory Visit | Attending: Family Medicine | Admitting: Family Medicine

## 2022-11-13 DIAGNOSIS — Z1231 Encounter for screening mammogram for malignant neoplasm of breast: Secondary | ICD-10-CM

## 2022-11-17 ENCOUNTER — Telehealth: Payer: Medicare Other

## 2022-11-21 ENCOUNTER — Telehealth: Payer: Self-pay | Admitting: Family Medicine

## 2022-11-21 DIAGNOSIS — E1169 Type 2 diabetes mellitus with other specified complication: Secondary | ICD-10-CM

## 2022-11-21 MED ORDER — ROSUVASTATIN CALCIUM 40 MG PO TABS
40.0000 mg | ORAL_TABLET | Freq: Every day | ORAL | 1 refills | Status: DC
Start: 2022-11-21 — End: 2023-02-12

## 2022-11-21 NOTE — Addendum Note (Signed)
Addended by: Roxanne Gates on: 11/21/2022 11:28 AM   Modules accepted: Orders

## 2022-11-21 NOTE — Telephone Encounter (Signed)
Prescription Request  11/21/2022  Is this a "Controlled Substance" medicine? No  LOV: 08/18/2022  What is the name of the medication or equipment?   rosuvastatin (CRESTOR) 40 MG tablet [[id number]]   Have you contacted your pharmacy to request a refill? No   Which pharmacy would you like this sent to?  [id number] [place], [place] - [address] AT [facility] [address] [facility] [address] [place] [address] Phone: [phone] Fax: [phone]    Patient notified that their request is being sent to the clinical staff for review and that they should receive a response within 2 business days.   Please advise at Mobile [phone] (mobile)

## 2022-11-21 NOTE — Telephone Encounter (Signed)
Rx sent 

## 2022-11-25 DIAGNOSIS — Z8701 Personal history of pneumonia (recurrent): Secondary | ICD-10-CM

## 2022-11-25 HISTORY — DX: Personal history of pneumonia (recurrent): Z87.01

## 2022-11-26 ENCOUNTER — Other Ambulatory Visit: Payer: Self-pay

## 2022-11-26 ENCOUNTER — Inpatient Hospital Stay (HOSPITAL_BASED_OUTPATIENT_CLINIC_OR_DEPARTMENT_OTHER)
Admission: EM | Admit: 2022-11-26 | Discharge: 2022-12-01 | DRG: 871 | Disposition: A | Discharge status: Home / Self Care | Payer: Medicare Other | Attending: Family Medicine | Admitting: Family Medicine

## 2022-11-26 ENCOUNTER — Emergency Department (HOSPITAL_BASED_OUTPATIENT_CLINIC_OR_DEPARTMENT_OTHER): Payer: Medicare Other

## 2022-11-26 ENCOUNTER — Encounter (HOSPITAL_BASED_OUTPATIENT_CLINIC_OR_DEPARTMENT_OTHER): Payer: Self-pay

## 2022-11-26 DIAGNOSIS — E785 Hyperlipidemia, unspecified: Secondary | ICD-10-CM | POA: Diagnosis present

## 2022-11-26 DIAGNOSIS — L309 Dermatitis, unspecified: Secondary | ICD-10-CM | POA: Diagnosis not present

## 2022-11-26 DIAGNOSIS — J181 Lobar pneumonia, unspecified organism: Secondary | ICD-10-CM | POA: Diagnosis present

## 2022-11-26 DIAGNOSIS — E1165 Type 2 diabetes mellitus with hyperglycemia: Secondary | ICD-10-CM | POA: Diagnosis present

## 2022-11-26 DIAGNOSIS — Z803 Family history of malignant neoplasm of breast: Secondary | ICD-10-CM

## 2022-11-26 DIAGNOSIS — Z885 Allergy status to narcotic agent status: Secondary | ICD-10-CM

## 2022-11-26 DIAGNOSIS — E876 Hypokalemia: Secondary | ICD-10-CM | POA: Diagnosis not present

## 2022-11-26 DIAGNOSIS — E872 Acidosis, unspecified: Secondary | ICD-10-CM | POA: Diagnosis not present

## 2022-11-26 DIAGNOSIS — H409 Unspecified glaucoma: Secondary | ICD-10-CM | POA: Diagnosis present

## 2022-11-26 DIAGNOSIS — R6521 Severe sepsis with septic shock: Secondary | ICD-10-CM | POA: Diagnosis not present

## 2022-11-26 DIAGNOSIS — Z87891 Personal history of nicotine dependence: Secondary | ICD-10-CM

## 2022-11-26 DIAGNOSIS — N170 Acute kidney failure with tubular necrosis: Secondary | ICD-10-CM | POA: Diagnosis not present

## 2022-11-26 DIAGNOSIS — R6520 Severe sepsis without septic shock: Secondary | ICD-10-CM | POA: Diagnosis not present

## 2022-11-26 DIAGNOSIS — Z79899 Other long term (current) drug therapy: Secondary | ICD-10-CM

## 2022-11-26 DIAGNOSIS — Z981 Arthrodesis status: Secondary | ICD-10-CM

## 2022-11-26 DIAGNOSIS — Z1152 Encounter for screening for COVID-19: Secondary | ICD-10-CM

## 2022-11-26 DIAGNOSIS — N179 Acute kidney failure, unspecified: Secondary | ICD-10-CM

## 2022-11-26 DIAGNOSIS — I700 Atherosclerosis of aorta: Secondary | ICD-10-CM | POA: Diagnosis not present

## 2022-11-26 DIAGNOSIS — R000 Tachycardia, unspecified: Secondary | ICD-10-CM | POA: Diagnosis not present

## 2022-11-26 DIAGNOSIS — J9601 Acute respiratory failure with hypoxia: Secondary | ICD-10-CM | POA: Diagnosis not present

## 2022-11-26 DIAGNOSIS — A419 Sepsis, unspecified organism: Principal | ICD-10-CM

## 2022-11-26 DIAGNOSIS — R339 Retention of urine, unspecified: Secondary | ICD-10-CM | POA: Diagnosis present

## 2022-11-26 DIAGNOSIS — K219 Gastro-esophageal reflux disease without esophagitis: Secondary | ICD-10-CM | POA: Diagnosis not present

## 2022-11-26 DIAGNOSIS — J189 Pneumonia, unspecified organism: Secondary | ICD-10-CM

## 2022-11-26 DIAGNOSIS — A403 Sepsis due to Streptococcus pneumoniae: Principal | ICD-10-CM | POA: Diagnosis present

## 2022-11-26 DIAGNOSIS — Z7982 Long term (current) use of aspirin: Secondary | ICD-10-CM

## 2022-11-26 DIAGNOSIS — Z1629 Resistance to other single specified antibiotic: Secondary | ICD-10-CM | POA: Diagnosis not present

## 2022-11-26 DIAGNOSIS — Z8249 Family history of ischemic heart disease and other diseases of the circulatory system: Secondary | ICD-10-CM

## 2022-11-26 DIAGNOSIS — Z833 Family history of diabetes mellitus: Secondary | ICD-10-CM

## 2022-11-26 DIAGNOSIS — I10 Essential (primary) hypertension: Secondary | ICD-10-CM | POA: Diagnosis not present

## 2022-11-26 DIAGNOSIS — Z818 Family history of other mental and behavioral disorders: Secondary | ICD-10-CM

## 2022-11-26 DIAGNOSIS — Z7984 Long term (current) use of oral hypoglycemic drugs: Secondary | ICD-10-CM | POA: Diagnosis not present

## 2022-11-26 DIAGNOSIS — R079 Chest pain, unspecified: Secondary | ICD-10-CM | POA: Diagnosis not present

## 2022-11-26 DIAGNOSIS — R109 Unspecified abdominal pain: Secondary | ICD-10-CM | POA: Diagnosis not present

## 2022-11-26 DIAGNOSIS — R1012 Left upper quadrant pain: Secondary | ICD-10-CM | POA: Diagnosis present

## 2022-11-26 LAB — COMPREHENSIVE METABOLIC PANEL
ALT: 19 U/L (ref 0–44)
AST: 20 U/L (ref 15–41)
Albumin: 4.1 g/dL (ref 3.5–5.0)
Alkaline Phosphatase: 39 U/L (ref 38–126)
Anion gap: 25 — ABNORMAL HIGH (ref 5–15)
BUN: 47 mg/dL — ABNORMAL HIGH (ref 8–23)
CO2: 17 mmol/L — ABNORMAL LOW (ref 22–32)
Calcium: 8.3 mg/dL — ABNORMAL LOW (ref 8.9–10.3)
Chloride: 94 mmol/L — ABNORMAL LOW (ref 98–111)
Creatinine, Ser: 3.14 mg/dL — ABNORMAL HIGH (ref 0.44–1.00)
GFR, Estimated: 15 mL/min — ABNORMAL LOW (ref >60)
Glucose, Bld: 106 mg/dL — ABNORMAL HIGH (ref 70–99)
Potassium: 3.2 mmol/L — ABNORMAL LOW (ref 3.5–5.1)
Sodium: 136 mmol/L (ref 135–145)
Total Bilirubin: 0.5 mg/dL (ref 0.3–1.2)
Total Protein: 7.5 g/dL (ref 6.5–8.1)

## 2022-11-26 LAB — CBC
HCT: 41.9 % (ref 36.0–46.0)
HCT: 38.7 % (ref 36.0–46.0)
Hemoglobin: 13.9 g/dL (ref 12.0–15.0)
Hemoglobin: 12.2 g/dL (ref 12.0–15.0)
MCH: 28.4 pg (ref 26.0–34.0)
MCH: 28.9 pg (ref 26.0–34.0)
MCHC: 33.2 g/dL (ref 30.0–36.0)
MCHC: 31.5 g/dL (ref 30.0–36.0)
MCV: 85.7 fL (ref 80.0–100.0)
MCV: 91.7 fL (ref 80.0–100.0)
Platelets: 275 10*3/uL (ref 150–400)
Platelets: 207 10*3/uL (ref 150–400)
RBC: 4.89 MIL/uL (ref 3.87–5.11)
RBC: 4.22 MIL/uL (ref 3.87–5.11)
RDW: 14.8 % (ref 11.5–15.5)
RDW: 14.7 % (ref 11.5–15.5)
WBC: 7.6 10*3/uL (ref 4.0–10.5)
WBC: 7.5 10*3/uL (ref 4.0–10.5)
nRBC: 0 % (ref 0.0–0.2)
nRBC: 0 % (ref 0.0–0.2)

## 2022-11-26 LAB — CULTURE, BLOOD (ROUTINE X 2): Special Requests: ADEQUATE

## 2022-11-26 LAB — GLUCOSE, CAPILLARY
Glucose-Capillary: 74 mg/dL (ref 70–99)
Glucose-Capillary: 107 mg/dL — ABNORMAL HIGH (ref 70–99)

## 2022-11-26 LAB — RESP PANEL BY RT-PCR (RSV, FLU A&B, COVID)  RVPGX2
Influenza A by PCR: NEGATIVE
Influenza B by PCR: NEGATIVE
Resp Syncytial Virus by PCR: NEGATIVE
SARS Coronavirus 2 by RT PCR: NEGATIVE

## 2022-11-26 LAB — LACTIC ACID, PLASMA
Lactic Acid, Venous: 6 mmol/L (ref 0.5–1.9)
Lactic Acid, Venous: 4.2 mmol/L (ref 0.5–1.9)
Lactic Acid, Venous: 3.7 mmol/L (ref 0.5–1.9)

## 2022-11-26 LAB — CREATININE, SERUM
Creatinine, Ser: 2.81 mg/dL — ABNORMAL HIGH (ref 0.44–1.00)
GFR, Estimated: 17 mL/min — ABNORMAL LOW (ref >60)

## 2022-11-26 LAB — MRSA NEXT GEN BY PCR, NASAL: MRSA by PCR Next Gen: NOT DETECTED

## 2022-11-26 LAB — APTT: aPTT: 40 seconds — ABNORMAL HIGH (ref 24–36)

## 2022-11-26 LAB — PROTIME-INR
INR: 1.3 — ABNORMAL HIGH (ref 0.8–1.2)
Prothrombin Time: 16.5 seconds — ABNORMAL HIGH (ref 11.4–15.2)

## 2022-11-26 LAB — LIPASE, BLOOD: Lipase: <10 U/L — ABNORMAL LOW (ref 11–51)

## 2022-11-26 MED ORDER — NOREPINEPHRINE 4 MG/250ML-% IV SOLN
0.0000 ug/min | INTRAVENOUS | Status: DC
  Administered 2022-11-26: 4 ug/min via INTRAVENOUS
  Filled 2022-11-26: qty 250

## 2022-11-26 MED ORDER — LEVOCETIRIZINE DIHYDROCHLORIDE 5 MG PO TABS: 5.0000 mg | ORAL_TABLET | Freq: Every evening | ORAL | Status: DC

## 2022-11-26 MED ORDER — ACETAMINOPHEN 325 MG PO TABS
650.0000 mg | ORAL_TABLET | ORAL | Status: DC | PRN
  Administered 2022-11-28 – 2022-11-29 (×3): 650 mg via ORAL
  Filled 2022-11-26 (×3): qty 2

## 2022-11-26 MED ORDER — FENTANYL CITRATE PF 50 MCG/ML IJ SOSY
50.0000 ug | PREFILLED_SYRINGE | INTRAMUSCULAR | Status: DC | PRN
  Administered 2022-11-26: 50 ug via INTRAVENOUS
  Filled 2022-11-26: qty 1

## 2022-11-26 MED ORDER — AZELASTINE HCL 0.1 % NA SOLN
1.0000 | Freq: Two times a day (BID) | NASAL | Status: DC
  Administered 2022-11-26 – 2022-11-29 (×7): 1 via NASAL
  Filled 2022-11-26: qty 30

## 2022-11-26 MED ORDER — OXYCODONE-ACETAMINOPHEN 5-325 MG PO TABS
1.0000 | ORAL_TABLET | ORAL | Status: DC | PRN
  Administered 2022-11-26: 2 via ORAL
  Administered 2022-11-27 (×2): 1 via ORAL
  Filled 2022-11-26 (×2): qty 1
  Filled 2022-11-26: qty 2

## 2022-11-26 MED ORDER — METRONIDAZOLE 500 MG/100ML IV SOLN
500.0000 mg | Freq: Once | INTRAVENOUS | Status: AC
  Administered 2022-11-26: 500 mg via INTRAVENOUS
  Filled 2022-11-26: qty 100

## 2022-11-26 MED ORDER — SODIUM CHLORIDE 0.9 % IV SOLN
2.0000 g | Freq: Once | INTRAVENOUS | Status: AC
  Administered 2022-11-26: 2 g via INTRAVENOUS
  Filled 2022-11-26: qty 20

## 2022-11-26 MED ORDER — ONDANSETRON HCL 4 MG/2ML IJ SOLN
4.0000 mg | Freq: Once | INTRAMUSCULAR | Status: AC
  Administered 2022-11-26: 4 mg via INTRAVENOUS
  Filled 2022-11-26: qty 2

## 2022-11-26 MED ORDER — CHLORHEXIDINE GLUCONATE CLOTH 2 % EX PADS
6.0000 | MEDICATED_PAD | Freq: Every day | CUTANEOUS | Status: DC
  Administered 2022-11-26 – 2022-12-01 (×6): 6 via TOPICAL

## 2022-11-26 MED ORDER — CO Q 10 10 MG PO CAPS: ORAL_CAPSULE | Freq: Every day | ORAL | Status: DC

## 2022-11-26 MED ORDER — POLYETHYLENE GLYCOL 3350 17 G PO PACK
17.0000 g | PACK | Freq: Every day | ORAL | Status: DC | PRN
  Administered 2022-11-30: 17 g via ORAL
  Filled 2022-11-26: qty 1

## 2022-11-26 MED ORDER — SODIUM CHLORIDE 0.9 % IV SOLN
500.0000 mg | Freq: Once | INTRAVENOUS | Status: AC
  Administered 2022-11-26: 500 mg via INTRAVENOUS
  Filled 2022-11-26: qty 5

## 2022-11-26 MED ORDER — SODIUM CHLORIDE 0.9 % IV SOLN
250.0000 mL | INTRAVENOUS | Status: DC
  Administered 2022-11-26: 250 mL via INTRAVENOUS

## 2022-11-26 MED ORDER — LIP MEDEX EX OINT
TOPICAL_OINTMENT | CUTANEOUS | Status: DC | PRN
  Filled 2022-11-26: qty 7

## 2022-11-26 MED ORDER — LACTATED RINGERS IV SOLN: INTRAVENOUS | Status: AC

## 2022-11-26 MED ORDER — LORATADINE 10 MG PO TABS
10.0000 mg | ORAL_TABLET | Freq: Every day | ORAL | Status: DC
  Administered 2022-11-26 – 2022-11-30 (×5): 10 mg via ORAL
  Filled 2022-11-26 (×5): qty 1

## 2022-11-26 MED ORDER — GUAIFENESIN ER 600 MG PO TB12
1200.0000 mg | ORAL_TABLET | Freq: Two times a day (BID) | ORAL | Status: DC | PRN
  Administered 2022-11-26 – 2022-11-27 (×2): 1200 mg via ORAL
  Filled 2022-11-26 (×2): qty 2

## 2022-11-26 MED ORDER — INSULIN ASPART 100 UNIT/ML IJ SOLN
0.0000 [IU] | Freq: Three times a day (TID) | INTRAMUSCULAR | Status: DC
  Administered 2022-11-28: 3 [IU] via SUBCUTANEOUS
  Administered 2022-11-29: 4 [IU] via SUBCUTANEOUS
  Administered 2022-11-29: 3 [IU] via SUBCUTANEOUS
  Administered 2022-11-29 – 2022-11-30 (×4): 4 [IU] via SUBCUTANEOUS
  Administered 2022-12-01: 3 [IU] via SUBCUTANEOUS

## 2022-11-26 MED ORDER — FENTANYL CITRATE PF 50 MCG/ML IJ SOSY
50.0000 ug | PREFILLED_SYRINGE | Freq: Once | INTRAMUSCULAR | Status: AC
  Administered 2022-11-26: 50 ug via INTRAVENOUS
  Filled 2022-11-26: qty 1

## 2022-11-26 MED ORDER — INSULIN ASPART 100 UNIT/ML IJ SOLN: 0.0000 [IU] | Freq: Every day | INTRAMUSCULAR | Status: DC

## 2022-11-26 MED ORDER — HEPARIN SODIUM (PORCINE) 5000 UNIT/ML IJ SOLN
5000.0000 [IU] | Freq: Three times a day (TID) | INTRAMUSCULAR | Status: DC
  Administered 2022-11-26 – 2022-12-01 (×15): 5000 [IU] via SUBCUTANEOUS
  Filled 2022-11-26 (×15): qty 1

## 2022-11-26 MED ORDER — FENOFIBRATE 160 MG PO TABS
160.0000 mg | ORAL_TABLET | Freq: Every day | ORAL | Status: DC
  Administered 2022-11-27 – 2022-12-01 (×5): 160 mg via ORAL
  Filled 2022-11-26 (×5): qty 1

## 2022-11-26 MED ORDER — LACTATED RINGERS IV BOLUS
1000.0000 mL | Freq: Once | INTRAVENOUS | Status: AC
  Administered 2022-11-26: 1000 mL via INTRAVENOUS

## 2022-11-26 MED ORDER — SODIUM CHLORIDE 0.9 % IV SOLN
2.0000 g | INTRAVENOUS | Status: DC
  Administered 2022-11-27 – 2022-12-01 (×5): 2 g via INTRAVENOUS
  Filled 2022-11-26 (×5): qty 20

## 2022-11-26 MED ORDER — FLUTICASONE PROPIONATE 50 MCG/ACT NA SUSP
2.0000 | Freq: Every day | NASAL | Status: DC
  Administered 2022-11-27 – 2022-12-01 (×4): 2 via NASAL
  Filled 2022-11-26: qty 16

## 2022-11-26 MED ORDER — SODIUM CHLORIDE 0.9 % IV SOLN: 500.0000 mg | INTRAVENOUS | Status: DC

## 2022-11-26 MED ORDER — DOCUSATE SODIUM 100 MG PO CAPS
100.0000 mg | ORAL_CAPSULE | Freq: Two times a day (BID) | ORAL | Status: DC | PRN
  Administered 2022-11-30: 100 mg via ORAL
  Filled 2022-11-26: qty 1

## 2022-11-26 MED ORDER — LATANOPROST 0.005 % OP SOLN
1.0000 [drp] | Freq: Every day | OPHTHALMIC | Status: DC
  Administered 2022-11-26 – 2022-11-30 (×5): 1 [drp] via OPHTHALMIC
  Filled 2022-11-26: qty 2.5

## 2022-11-26 MED ORDER — NOREPINEPHRINE 4 MG/250ML-% IV SOLN
2.0000 ug/min | INTRAVENOUS | Status: DC
  Administered 2022-11-27: 4 ug/min via INTRAVENOUS
  Filled 2022-11-26: qty 250

## 2022-11-26 MED ORDER — LACTATED RINGERS IV BOLUS: 1000.0000 mL | Freq: Once | INTRAVENOUS | Status: DC

## 2022-11-26 MED ORDER — ROSUVASTATIN CALCIUM 20 MG PO TABS
40.0000 mg | ORAL_TABLET | Freq: Every day | ORAL | Status: DC
  Administered 2022-11-26 – 2022-11-30 (×5): 40 mg via ORAL
  Filled 2022-11-26 (×5): qty 2

## 2022-11-26 MED ORDER — LACTATED RINGERS IV BOLUS (SEPSIS)
1000.0000 mL | Freq: Once | INTRAVENOUS | Status: AC
  Administered 2022-11-26: 1000 mL via INTRAVENOUS

## 2022-11-26 MED ORDER — ORAL CARE MOUTH RINSE: 15.0000 mL | OROMUCOSAL | Status: DC | PRN

## 2022-11-26 MED ORDER — ASPIRIN 81 MG PO TBEC
81.0000 mg | DELAYED_RELEASE_TABLET | Freq: Every day | ORAL | Status: DC
  Administered 2022-11-27 – 2022-12-01 (×5): 81 mg via ORAL
  Filled 2022-11-26 (×5): qty 1

## 2022-11-26 MED ORDER — PANTOPRAZOLE SODIUM 20 MG PO TBEC
20.0000 mg | DELAYED_RELEASE_TABLET | Freq: Every day | ORAL | Status: DC
  Administered 2022-11-27 – 2022-12-01 (×5): 20 mg via ORAL
  Filled 2022-11-26 (×5): qty 1

## 2022-11-26 NOTE — ED Notes (Signed)
Pt was placed on 2 lpm [place] to help with the work of breathing for Pt.

## 2022-11-26 NOTE — Progress Notes (Signed)
An USGPIV (ultrasound guided PIV) has been placed for short-term vasopressor infusion. A correctly placed ivWatch must be used when administering Vasopressors. Should this treatment be needed beyond 72 hours, central line access should be obtained.  It will be the responsibility of the bedside nurse to follow best practice to prevent extravasations.   

## 2022-11-26 NOTE — Progress Notes (Signed)
   PCCM transfer request    Sending physician:  [facility]  Sending facility: [facility]  Reason for transfer: septic shock from pneumonia needing pressors.    Brief case summary: 72 year old woman with malaise and SOB. Not requiring mechanical ventilation but hypotensive despite fluids with high lactate. Antibiotics given   Recommendations made prior to transfer: continue fluid resuscitation  Transfer accepted: yes/no no. Patient will go to [facility].   Lynnell Catalan 11/26/22 3:31 PM [facility]  For contact information, see Amion. If no response to pager, please call PCCM consult pager. After hours, 7PM- 7AM, please call Elink.

## 2022-11-26 NOTE — H&P (Addendum)
NAME:  Alyssa Ford, MRN:  [id number], DOB:  Oct 18, 1950, LOS: 0 ADMISSION DATE:  11/26/2022, CONSULTATION DATE:  11/26/2022 REFERRING MD:  Dr. Anitra Lauth, ER , CHIEF COMPLAINT:  Septic shock   History of Present Illness:  72 yo female former smoker presented to [facility] ER with left side pain.  This started on 5/31 and progressed.  She had temp 18F at home.  She was having sinus congestion before this, and also had nausea and an episode of diarrhea.  In the ER SpO2 was 88% and BP was 67/41.  CXR showed pneumonia.  She was started on supplemental oxygen, ABx, IV fluids and pressors.  PCCM asked to arrange for admission to ICU.  Pertinent  Medical History  Anemia, Anxiety, OA, Cataract, Headaches, Allergies, Depression, DM type 2, Diverticulosis, Eczema, GERD, Glaucoma, HH, HLD, HTN, Thyroid nodule, lung nodule  Significant Hospital Events: Including procedures, antibiotic start and stop dates in addition to other pertinent events   6/02 Admit, start ABx and pressors  Studies:  CT abd/pelvis 6/02 >> consolidation LLL, fatty liver, atherosclerosis  Interim History / Subjective:  Denies headache, sore throat, skin rash, joint swelling, or leg swelling.  She was around her grandchildren "who always having something".  Objective   Blood pressure (!) 89/48, pulse (!) 109, temperature (!) 97.4 F (36.3 C), temperature source Oral, resp. rate (!) 27, height 5\' 6"  (1.676 m), weight 67.9 kg, SpO2 (!) 88 %.        Intake/Output Summary (Last 24 hours) at 11/26/2022 1742 Last data filed at 11/26/2022 1705 Gross per 24 hour  Intake 3244.1 ml  Output --  Net 3244.1 ml   Filed Weights   11/26/22 1248 11/26/22 1705  Weight: 67.1 kg 67.9 kg    Examination:  General - alert Eyes - pupils reactive ENT - no sinus tenderness, no stridor Cardiac - regular rate/rhythm, no murmur Chest - crackles Lt > Rt base Abdomen - soft, non tender, + bowel sounds Extremities - no cyanosis, clubbing, or  edema Skin - no rashes Neuro - normal strength, moves extremities, follows commands Psych - normal mood and behavior  Resolved Hospital Problem list     Assessment & Plan:   Septic shock from community acquired Lt lower lobar pneumonia. - day 1 of rocephin, zithromax - continue IV fluids - pressors to keep MAP > 65 - f/u blood culture, urine legionella and pneumococcal Ag from 6/02  Acute hypoxic respiratory failure. - oxygen to keep SpO2 > 92% - bronchial hygiene - f/u CXR intermittently  AKI from ATN 2nd to sepsis. Hypokalemia. Anion gap metabolic acidosis with lactic acidosis. - baseline creatinine 0.91 from 10/19/22 - continue IV fluids - monitor renal fx, urine outpt - f/u lactic acid level - no indication for renal replacement therapy at this time  DM type 2 poorly controlled with hyperglycemia. - SSI - hold outpt metformin, prandin, rybelsus  Hx of HTN, HLD. - continue ASA, fenofibrate, crestor - hold outpt lisinopril  Hx of glaucoma. - continue xalatan  Hx of Eczema, allergies. - continue azelastine, claritin, flonase  Hx of GERD. - protonix  Best Practice (right click and "Reselect all SmartList Selections" daily)   Diet/type: Regular consistency (see orders) DVT prophylaxis: prophylactic heparin  GI prophylaxis: PPI Lines: N/A Foley:  N/A Code Status:  full code Last date of multidisciplinary goals of care discussion [x]   Labs   CBC: Recent Labs  Lab 11/26/22 1254  WBC 7.6  HGB 13.9  HCT 41.9  MCV 85.7  PLT 275    Basic Metabolic Panel: Recent Labs  Lab 11/26/22 1254  NA 136  K 3.2*  CL 94*  CO2 17*  GLUCOSE 106*  BUN 47*  CREATININE 3.14*  CALCIUM 8.3*   GFR: Estimated Creatinine Clearance: 15.2 mL/min (A) (by C-G formula based on SCr of 3.14 mg/dL (H)). Recent Labs  Lab 11/26/22 1250 11/26/22 1254  WBC  --  7.6  LATICACIDVEN 6.0*  --     Liver Function Tests: Recent Labs  Lab 11/26/22 1254  AST 20  ALT 19   ALKPHOS 39  BILITOT 0.5  PROT 7.5  ALBUMIN 4.1   Recent Labs  Lab 11/26/22 1254  LIPASE <10*   No results for input(s): "AMMONIA" in the last 168 hours.  ABG    Component Value Date/Time   HCO3 27.2 (H) 09/05/2007 1044   TCO2 28 09/05/2007 1044     Coagulation Profile: Recent Labs  Lab 11/26/22 1254  INR 1.3*    Cardiac Enzymes: No results for input(s): "CKTOTAL", "CKMB", "CKMBINDEX", "TROPONINI" in the last 168 hours.  HbA1C: Hgb A1c MFr Bld  Date/Time Value Ref Range Status  10/19/2022 09:37 AM 6.5 4.6 - 6.5 % Final    Comment:    Glycemic Control Guidelines for People with Diabetes:Non Diabetic:  <6%Goal of Therapy: <7%Additional Action Suggested:  >8%   07/21/2022 08:00 AM 6.7 (H) 4.6 - 6.5 % Final    Comment:    Glycemic Control Guidelines for People with Diabetes:Non Diabetic:  <6%Goal of Therapy: <7%Additional Action Suggested:  >8%     CBG: No results for input(s): "GLUCAP" in the last 168 hours.  Review of Systems:   Reviewed and negative  Past Medical History:  She,  has a past medical history of Anemia, Anxiety, Arthritis, Cataract, Chronic headaches, Depression, Diabetes mellitus, Diverticulosis of colon (without mention of hemorrhage) (2003, 2008), Eczema, GERD (gastroesophageal reflux disease), Glaucoma, Hiatal hernia (2003), Hyperlipidemia, Hypertension, and Thyroid nodule.   Surgical History:   Past Surgical History:  Procedure Laterality Date   CARPAL TUNNEL RELEASE  2006   CARPAL TUNNEL RELEASE  12/24/2007   CATARACT EXTRACTION, BILATERAL     [place]---  sept and oct 2018   CERVICAL FUSION  3/08   CHOLECYSTECTOMY     FASCIECTOMY Left 08/01/2018   Procedure: FASCIECTOMY LEFT RING;  Surgeon: Cindee Salt, MD;  Location: [facility];  Service: Orthopedics;  Laterality: Left;  UPPER ARM   TRIGGER FINGER RELEASE Left 08/01/2018   Procedure: RELEASE TRIGGER FINGER/A-1 PULLEY LEFTRING;  Surgeon: Cindee Salt, MD;  Location: [facility];  Service: Orthopedics;  Laterality: Left;     Social History:   reports that she quit smoking about 7 years ago. Her smoking use included cigarettes. She has a 68.60 pack-year smoking history. She has been exposed to tobacco smoke. She has never used smokeless tobacco. She reports current alcohol use. She reports that she does not use drugs.   Family History:  Her family history includes Angioedema in her mother; Breast cancer (age of onset: 71) in her mother; Cancer (age of onset: 54) in her mother; Dementia in her mother; Diabetes in her brother and mother; Diabetes (age of onset: 53) in her father; Heart attack in her brother; Heart disease (age of onset: 40) in her cousin; Hyperlipidemia in her mother; Hypertension in her brother, mother, and sister; Kidney failure (age of onset: 1) in her mother; Parkinsonism in her father.  There is no history of Colon cancer, Esophageal cancer, or Stomach cancer.   Allergies Allergies  Allergen Reactions   Codeine Nausea Only    Severe nausea     Home Medications  Prior to Admission medications   Medication Sig Start Date End Date Taking? Authorizing Provider  amitriptyline (ELAVIL) 25 MG tablet Take 2 tablets (50 mg total) by mouth daily at 12 noon. Patient taking differently: Take 50 mg by mouth at bedtime. 03/07/22   Donato Schultz, DO  aspirin EC 81 MG tablet Take 81 mg by mouth daily.    [name], Yvonne R, DO  azelastine (ASTELIN) 0.1 % nasal spray Place 1 spray into both nostrils 2 (two) times daily. Use in each nostril as directed 06/02/22   [name], Myrene Buddy R, DO  BIOTIN PO Take 1 tablet by mouth daily.     [provider]  Coenzyme Q10 (CO Q 10 PO) Take 1 capsule by mouth daily.    [provider]  fenofibrate 160 MG tablet Take 1 tablet (160 mg total) by mouth daily. 06/05/22   Donato Schultz, DO  Ferrous Sulfate (IRON SUPPLEMENT PO) Take 1 tablet by mouth daily. 65mg     [provider]  fluticasone (FLONASE) 50 MCG/ACT nasal spray Place 2 sprays into both nostrils daily. 07/28/22   [name], MD  ipratropium (ATROVENT) 0.06 % nasal spray Place 2 sprays into both nostrils 3 (three) times daily as needed for rhinitis. 07/28/22   [name], MD  latanoprost (XALATAN) 0.005 % ophthalmic solution Place 1 drop into both eyes at bedtime.  10/17/13   [provider]  levocetirizine (XYZAL) 5 MG tablet Take 1 tablet (5 mg total) by mouth every evening. 07/28/22   [name], MD  lisinopril (ZESTRIL) 5 MG tablet TAKE 1 TABLET(5 MG) BY MOUTH DAILY 11/13/22   [name], Grayling Congress, DO  Melatonin 10 MG TABS Take 1 tablet by mouth at bedtime as needed (sleep).    [provider]  metFORMIN (GLUCOPHAGE-XR) 500 MG 24 hr tablet Take 4 pills every day 10/24/22   Altamese Dolores, MD  Naproxen Sodium (ALEVE PO) Take 2 tablets by mouth daily.     [provider]  NONFORMULARY OR COMPOUNDED ITEM Cbd /delta 9 1 po qhs 06/02/22   [name], Grayling Congress, DO  omeprazole (PRILOSEC) 20 MG capsule Take 1 capsule (20 mg total) by mouth daily. 07/14/22   [name], DO  OVER THE COUNTER MEDICATION Take 1 each by mouth at bedtime as needed (sleep). CBD Gummy    [provider]  repaglinide (PRANDIN) 1 MG tablet Take 2 tablets (2 mg total) by mouth daily. Before evening meal. 10/24/22   Motwani, Carin Hock, MD  rosuvastatin (CRESTOR) 40 MG tablet Take 1 tablet (40 mg total) by mouth at bedtime. 11/21/22   Donato Schultz, DO  Semaglutide (RYBELSUS) 3 MG TABS 1 tablet daily 10/24/22   Altamese Roscoe, MD     Critical care time: 42 minutes  Coralyn Helling, MD [facility] Pulmonary/Critical Care Pager - [phone] or [phone] 11/26/2022, 6:11 PM

## 2022-11-26 NOTE — ED Notes (Signed)
CRITICAL VALUE STICKER  CRITICAL VALUE:Lactate 6.0  RECEIVER (on-site recipient of call):Carmon Ginsberg, RN  DATE & TIME NOTIFIED:   MESSENGER (representative from lab):  MD NOTIFIED: Dr. Anitra Lauth  TIME OF NOTIFICATION:0420  RESPONSE:

## 2022-11-26 NOTE — Sepsis Progress Note (Signed)
Spoke w/ bedside nurse regarding 2nd lactic acid draw. Lab was drawn and had clotted and order was canceled. Order for another LA per sepsis protocol is being placed.

## 2022-11-26 NOTE — ED Provider Notes (Signed)
[place] EMERGENCY DEPARTMENT AT [facility] Provider Note   CSN: [id number] Arrival date & time: 11/26/22  1240     History {Add pertinent medical, surgical, social history, OB history to HPI:1} Chief Complaint  Patient presents with   Abdominal Pain    Alyssa Ford is a 72 y.o. female.  Patient is a 72 year old female with a history of diabetes, hypertension, hyperlipidemia, GERD who is presenting today with 3 days of feeling unwell.  She reports everything started on Friday when she was having a pain in her side, chills, minimal cough and just feeling generally unwell.  Symptoms have progressed over the last few days and now she is having severe pain on her left side in her abdomen and chest up to her shoulder, shortness of breath, chills, anorexia, generalized weakness.  She denies any numbness or tingling in her legs.  She cannot remember the last time she urinated but denied any dysuria.  She had 1 episode of diarrhea today.  She did take all of her medications today including her blood pressure medication.  She denies any swelling in her legs.  The history is provided by the patient.  Abdominal Pain      Home Medications Prior to Admission medications   Medication Sig Start Date End Date Taking? Authorizing Provider  amitriptyline (ELAVIL) 25 MG tablet Take 2 tablets (50 mg total) by mouth daily at 12 noon. Patient taking differently: Take 50 mg by mouth at bedtime. 03/07/22   Donato Schultz, DO  aspirin EC 81 MG tablet Take 81 mg by mouth daily.    [name], Yvonne R, DO  azelastine (ASTELIN) 0.1 % nasal spray Place 1 spray into both nostrils 2 (two) times daily. Use in each nostril as directed 06/02/22   [name], Myrene Buddy R, DO  BIOTIN PO Take 1 tablet by mouth daily.     [provider]  Coenzyme Q10 (CO Q 10 PO) Take 1 capsule by mouth daily.    [provider]  fenofibrate 160 MG tablet Take 1 tablet (160 mg total) by mouth daily.  06/05/22   Donato Schultz, DO  Ferrous Sulfate (IRON SUPPLEMENT PO) Take 1 tablet by mouth daily. 65mg     [provider]  fluticasone (FLONASE) 50 MCG/ACT nasal spray Place 2 sprays into both nostrils daily. 07/28/22   [name], MD  ipratropium (ATROVENT) 0.06 % nasal spray Place 2 sprays into both nostrils 3 (three) times daily as needed for rhinitis. 07/28/22   [name], MD  latanoprost (XALATAN) 0.005 % ophthalmic solution Place 1 drop into both eyes at bedtime.  10/17/13   [provider]  levocetirizine (XYZAL) 5 MG tablet Take 1 tablet (5 mg total) by mouth every evening. 07/28/22   [name], MD  lisinopril (ZESTRIL) 5 MG tablet TAKE 1 TABLET(5 MG) BY MOUTH DAILY 11/13/22   [name], Grayling Congress, DO  Melatonin 10 MG TABS Take 1 tablet by mouth at bedtime as needed (sleep).    [provider]  metFORMIN (GLUCOPHAGE-XR) 500 MG 24 hr tablet Take 4 pills every day 10/24/22   Altamese [place], MD  Naproxen Sodium (ALEVE PO) Take 2 tablets by mouth daily.     [provider]  NONFORMULARY OR COMPOUNDED ITEM Cbd /delta 9 1 po qhs 06/02/22   [name], Grayling Congress, DO  omeprazole (PRILOSEC) 20 MG capsule Take 1 capsule (20 mg total) by mouth daily. 07/14/22  [name], Yvonne R, DO  OVER THE COUNTER MEDICATION Take 1 each by mouth at bedtime as needed (sleep). CBD Gummy    [provider]  repaglinide (PRANDIN) 1 MG tablet Take 2 tablets (2 mg total) by mouth daily. Before evening meal. 10/24/22   Motwani, Carin Hock, MD  rosuvastatin (CRESTOR) 40 MG tablet Take 1 tablet (40 mg total) by mouth at bedtime. 11/21/22   Donato Schultz, DO  Semaglutide (RYBELSUS) 3 MG TABS 1 tablet daily 10/24/22   Altamese [place], MD      Allergies    Codeine    Review of Systems   Review of Systems  Gastrointestinal:  Positive for abdominal pain.    Physical Exam Updated Vital Signs BP (!) 89/54   Pulse (!) 107   Temp 97.6 F (36.4 C) (Oral)   Resp  (!) 25   Ht 5\' 6"  (1.676 m)   Wt 67.1 kg   SpO2 100%   BMI 23.89 kg/m  Physical Exam Vitals and nursing note reviewed.  Constitutional:      General: She is in acute distress.     Appearance: She is well-developed. She is ill-appearing.  HENT:     Head: Normocephalic and atraumatic.     Mouth/Throat:     Mouth: Mucous membranes are dry.  Eyes:     Pupils: Pupils are equal, round, and reactive to light.  Cardiovascular:     Rate and Rhythm: Regular rhythm. Tachycardia present.     Pulses: Normal pulses.     Heart sounds: Normal heart sounds. No murmur heard.    No friction rub.  Pulmonary:     Effort: Pulmonary effort is normal.     Breath sounds: Rhonchi present. No wheezing or rales.  Abdominal:     General: Bowel sounds are normal. There is no distension.     Palpations: Abdomen is soft.     Tenderness: There is abdominal tenderness in the left upper quadrant and left lower quadrant. There is left CVA tenderness. There is no guarding or rebound.  Musculoskeletal:        General: No tenderness. Normal range of motion.     Right lower leg: No edema.     Left lower leg: No edema.     Comments: No edema  Skin:    General: Skin is warm and dry.     Findings: No rash.  Neurological:     Mental Status: She is alert and oriented to person, place, and time. Mental status is at baseline.     Cranial Nerves: No cranial nerve deficit.  Psychiatric:        Mood and Affect: Mood normal.        Behavior: Behavior normal.     ED Results / Procedures / Treatments   Labs (all labs ordered are listed, but only abnormal results are displayed) Labs Reviewed  LIPASE, BLOOD - Abnormal; Notable for the following components:      Result Value   Lipase <10 (*)    All other components within normal limits  COMPREHENSIVE METABOLIC PANEL - Abnormal; Notable for the following components:   Potassium 3.2 (*)    Chloride 94 (*)    CO2 17 (*)    Glucose, Bld 106 (*)    BUN 47 (*)     Creatinine, Ser 3.14 (*)    Calcium 8.3 (*)    GFR, Estimated 15 (*)    Anion gap 25 (*)    All  other components within normal limits  LACTIC ACID, PLASMA - Abnormal; Notable for the following components:   Lactic Acid, Venous 6.0 (*)    All other components within normal limits  PROTIME-INR - Abnormal; Notable for the following components:   Prothrombin Time 16.5 (*)    INR 1.3 (*)    All other components within normal limits  APTT - Abnormal; Notable for the following components:   aPTT 40 (*)    All other components within normal limits  RESP PANEL BY RT-PCR (RSV, FLU A&B, COVID)  RVPGX2  CULTURE, BLOOD (ROUTINE X 2)  CULTURE, BLOOD (ROUTINE X 2)  CBC  LACTIC ACID, PLASMA  URINALYSIS, W/ REFLEX TO CULTURE (INFECTION SUSPECTED)    EKG EKG Interpretation  Date/Time:  Sunday November 26 2022 12:56:47 EDT Ventricular Rate:  104 PR Interval:  155 QRS Duration: 100 QT Interval:  339 QTC Calculation: 446 R Axis:   -14 Text Interpretation: Sinus tachycardia Minimal ST elevation, inferior leads No significant change since last tracing Confirmed by Gwyneth Sprout ([id number]) on 11/26/2022 1:35:33 PM  Radiology DG Chest Port 1 View  Result Date: 11/26/2022 CLINICAL DATA:  Questionable sepsis.  Pain. EXAM: PORTABLE CHEST 1 VIEW COMPARISON:  August 04, 2020 FINDINGS: The cardiomediastinal silhouette is stable. The right lung is clear. Mild opacity in the left lateral lung base. No other interval changes or acute abnormalities. IMPRESSION: Mild opacity in the left lateral lung base could represent atelectasis or early infiltrate. Recommend short-term follow-up imaging to ensure resolution. Electronically Signed   By: Gerome Sam III M.D.   On: 11/26/2022 13:34    Procedures Procedures  {Document cardiac monitor, telemetry assessment procedure when appropriate:1}  Medications Ordered in ED Medications  lactated ringers infusion (has no administration in time range)  metroNIDAZOLE  (FLAGYL) IVPB 500 mg (500 mg Intravenous New Bag/Given 11/26/22 1338)  lactated ringers bolus 1,000 mL (has no administration in time range)  norepinephrine (LEVOPHED) 4mg  in (0.016 mg/mL) premix infusion (1 mcg/min Intravenous Rate/Dose Change 11/26/22 1421)  fentaNYL (SUBLIMAZE) injection 50 mcg (has no administration in time range)  lactated ringers bolus 1,000 mL (1,000 mLs Intravenous New Bag/Given 11/26/22 1333)  cefTRIAXone (ROCEPHIN) 2 g in sodium chloride 0.9 % 100 mL IVPB (0 g Intravenous Stopped 11/26/22 1405)  ondansetron (ZOFRAN) injection 4 mg (4 mg Intravenous Given 11/26/22 1330)    ED Course/ Medical Decision Making/ A&P   {   Click here for ABCD2, HEART and other calculatorsREFRESH Note before signing :1}                          Medical Decision Making Amount and/or Complexity of Data Reviewed Labs: ordered. Radiology: ordered. ECG/medicine tests: ordered.  Risk Prescription drug management.   Pt with multiple medical problems and comorbidities and presenting today with a complaint that caries a high risk for morbidity and mortality.  Presenting today with concern for sepsis as patient is hypothermic, hypotensive and tachycardic.  Patient also with tachypnea.  Patient is having pain on her left side and abdomen.  Concern for retained kidney stone with infection versus pneumonia versus diverticulitis with perforation.  No evidence to suggest shingles.  Patient has good pulses bilaterally but concern for possible AAA.  Sepsis order set was initiated.  Patient was given 2 L of IV fluid however blood pressure remained after 1 L in the 70s and she was started on Levophed.  I independently interpreted patient's EKG and labs.  EKG was sinus tachycardia, CBC normal, CMP with a acidosis with an anion gap of 25, creatinine of 3.14, BUN elevated at 47, sodium normal, mild hypokalemia of 3.2 and prior creatinines have always been less than 1.  Lactic acid of 6.  I have independently  visualized and interpreted pt's images today.  Chest x-ray with haziness in the left lung concerning for possible infection.  However given patient's above findings and also having abdominal pain we will do a CT without contrast for further evaluation.  She had been started on antibiotics for abdominal pathology with Rocephin and Flagyl which should also cover for lung pathology.    {Document critical care time when appropriate:1} {Document review of labs and clinical decision tools ie heart score, Chads2Vasc2 etc:1}  {Document your independent review of radiology images, and any outside records:1} {Document your discussion with family members, caretakers, and with consultants:1} {Document social determinants of health affecting pt's care:1} {Document your decision making why or why not admission, treatments were needed:1} Final Clinical Impression(s) / ED Diagnoses Final diagnoses:  None    Rx / DC Orders ED Discharge Orders     None

## 2022-11-26 NOTE — ED Notes (Signed)
Pt unable to give urine sample at this time. Pt stated she has had a hard time going lately.

## 2022-11-26 NOTE — Sepsis Progress Note (Signed)
Sepsis protocol is being followed by [facility]. 

## 2022-11-26 NOTE — ED Triage Notes (Signed)
Patient here POV from Home.  Endorses Left Flank Pain that began 2 Days ago. Has since now radiated to LLQ ABD. Some Nausea and Diarrhea. No Emesis. No Known Fevers.   NAD noted during Triage. A&Ox4. Gcs 15. Ambulatory.

## 2022-11-26 NOTE — ED Notes (Signed)
Patient transported to CT. Tim RN is at bedside with Pt during testing. Pt remained on portable monitoring.

## 2022-11-26 NOTE — Progress Notes (Signed)
PHARMACIST - PHYSICIAN ORDER COMMUNICATION  CONCERNING: P&T Medication Policy on Herbal Medications  DESCRIPTION:  This patient's order for:  CoQ10  has been noted.  This product(s) is classified as an "herbal" or natural product. Due to a lack of definitive safety studies or FDA approval, nonstandard manufacturing practices, plus the potential risk of unknown drug-drug interactions while on inpatient medications, the Pharmacy and Therapeutics Committee does not permit the use of "herbal" or natural products of this type within [facility].   ACTION TAKEN: The pharmacy department is unable to verify this order at this time and your patient has been informed of this safety policy. Please reevaluate patient's clinical condition at discharge and address if the herbal or natural product(s) should be resumed at that time.   Arley Phenix RPh 11/26/2022, 5:45 PM

## 2022-11-27 DIAGNOSIS — R6521 Severe sepsis with septic shock: Secondary | ICD-10-CM | POA: Diagnosis not present

## 2022-11-27 DIAGNOSIS — A419 Sepsis, unspecified organism: Secondary | ICD-10-CM | POA: Diagnosis not present

## 2022-11-27 LAB — URINALYSIS, W/ REFLEX TO CULTURE (INFECTION SUSPECTED)
Bilirubin Urine: NEGATIVE
Glucose, UA: NEGATIVE mg/dL
Ketones, ur: NEGATIVE mg/dL
Nitrite: NEGATIVE
Protein, ur: NEGATIVE mg/dL
Specific Gravity, Urine: 1.011 (ref 1.005–1.030)
pH: 5 (ref 5.0–8.0)

## 2022-11-27 LAB — BLOOD CULTURE ID PANEL (REFLEXED) - BCID2

## 2022-11-27 LAB — COMPREHENSIVE METABOLIC PANEL
ALT: 19 U/L (ref 0–44)
AST: 20 U/L (ref 15–41)
Albumin: 2.7 g/dL — ABNORMAL LOW (ref 3.5–5.0)
Alkaline Phosphatase: 43 U/L (ref 38–126)
Anion gap: 17 — ABNORMAL HIGH (ref 5–15)
BUN: 60 mg/dL — ABNORMAL HIGH (ref 8–23)
CO2: 19 mmol/L — ABNORMAL LOW (ref 22–32)
Calcium: 7 mg/dL — ABNORMAL LOW (ref 8.9–10.3)
Chloride: 95 mmol/L — ABNORMAL LOW (ref 98–111)
Creatinine, Ser: 3.26 mg/dL — ABNORMAL HIGH (ref 0.44–1.00)
GFR, Estimated: 14 mL/min — ABNORMAL LOW (ref >60)
Glucose, Bld: 72 mg/dL (ref 70–99)
Potassium: 3.8 mmol/L (ref 3.5–5.1)
Sodium: 131 mmol/L — ABNORMAL LOW (ref 135–145)
Total Bilirubin: 0.6 mg/dL (ref 0.3–1.2)
Total Protein: 6.1 g/dL — ABNORMAL LOW (ref 6.5–8.1)

## 2022-11-27 LAB — GLUCOSE, CAPILLARY
Glucose-Capillary: 63 mg/dL — ABNORMAL LOW (ref 70–99)
Glucose-Capillary: 96 mg/dL (ref 70–99)
Glucose-Capillary: 106 mg/dL — ABNORMAL HIGH (ref 70–99)
Glucose-Capillary: 110 mg/dL — ABNORMAL HIGH (ref 70–99)

## 2022-11-27 LAB — CULTURE, BLOOD (ROUTINE X 2)

## 2022-11-27 LAB — BASIC METABOLIC PANEL
Anion gap: 16 — ABNORMAL HIGH (ref 5–15)
BUN: 59 mg/dL — ABNORMAL HIGH (ref 8–23)
CO2: 20 mmol/L — ABNORMAL LOW (ref 22–32)
Calcium: 6.4 mg/dL — CL (ref 8.9–10.3)
Chloride: 96 mmol/L — ABNORMAL LOW (ref 98–111)
Creatinine, Ser: 3.09 mg/dL — ABNORMAL HIGH (ref 0.44–1.00)
GFR, Estimated: 15 mL/min — ABNORMAL LOW (ref >60)
Glucose, Bld: 84 mg/dL (ref 70–99)
Potassium: 4.2 mmol/L (ref 3.5–5.1)
Sodium: 132 mmol/L — ABNORMAL LOW (ref 135–145)

## 2022-11-27 LAB — CBC
HCT: 35.5 % — ABNORMAL LOW (ref 36.0–46.0)
Hemoglobin: 11.5 g/dL — ABNORMAL LOW (ref 12.0–15.0)
MCH: 28.6 pg (ref 26.0–34.0)
MCHC: 32.4 g/dL (ref 30.0–36.0)
MCV: 88.3 fL (ref 80.0–100.0)
Platelets: 234 10*3/uL (ref 150–400)
RBC: 4.02 MIL/uL (ref 3.87–5.11)
RDW: 14.5 % (ref 11.5–15.5)
WBC: 11.6 10*3/uL — ABNORMAL HIGH (ref 4.0–10.5)
nRBC: 0 % (ref 0.0–0.2)

## 2022-11-27 LAB — LACTIC ACID, PLASMA: Lactic Acid, Venous: 1.5 mmol/L (ref 0.5–1.9)

## 2022-11-27 LAB — MAGNESIUM
Magnesium: 0.6 mg/dL — CL (ref 1.7–2.4)
Magnesium: 1.2 mg/dL — ABNORMAL LOW (ref 1.7–2.4)

## 2022-11-27 LAB — PHOSPHORUS: Phosphorus: 8.7 mg/dL — ABNORMAL HIGH (ref 2.5–4.6)

## 2022-11-27 LAB — STREP PNEUMONIAE URINARY ANTIGEN: Strep Pneumo Urinary Antigen: POSITIVE — AB

## 2022-11-27 MED ORDER — DIPHENHYDRAMINE HCL 50 MG/ML IJ SOLN
12.5000 mg | Freq: Once | INTRAMUSCULAR | Status: AC
  Administered 2022-11-27: 12.5 mg via INTRAVENOUS

## 2022-11-27 MED ORDER — DIPHENHYDRAMINE HCL 50 MG/ML IJ SOLN
INTRAMUSCULAR | Status: AC
  Filled 2022-11-27: qty 1

## 2022-11-27 MED ORDER — BENZONATATE 100 MG PO CAPS
100.0000 mg | ORAL_CAPSULE | Freq: Three times a day (TID) | ORAL | Status: DC | PRN
  Administered 2022-11-27 – 2022-11-28 (×3): 100 mg via ORAL
  Filled 2022-11-27 (×3): qty 1

## 2022-11-27 MED ORDER — CALCIUM GLUCONATE-NACL 2-0.675 GM/100ML-% IV SOLN
2.0000 g | Freq: Once | INTRAVENOUS | Status: AC
  Administered 2022-11-27: 2000 mg via INTRAVENOUS
  Filled 2022-11-27: qty 100

## 2022-11-27 MED ORDER — GUAIFENESIN 100 MG/5ML PO LIQD
5.0000 mL | ORAL | Status: DC | PRN
  Administered 2022-11-27 – 2022-11-29 (×3): 5 mL via ORAL
  Filled 2022-11-27 (×3): qty 10

## 2022-11-27 MED ORDER — HYDROCORTISONE SOD SUC (PF) 100 MG IJ SOLR
100.0000 mg | Freq: Two times a day (BID) | INTRAMUSCULAR | Status: DC
  Administered 2022-11-27 – 2022-12-01 (×9): 100 mg via INTRAVENOUS
  Filled 2022-11-27 (×10): qty 2

## 2022-11-27 MED ORDER — MAGNESIUM SULFATE 2 GM/50ML IV SOLN
2.0000 g | Freq: Once | INTRAVENOUS | Status: AC
  Administered 2022-11-27: 2 g via INTRAVENOUS
  Filled 2022-11-27: qty 50

## 2022-11-27 NOTE — Progress Notes (Signed)
Date and time results received: 11/27/22 0406 (use smartphrase ".now" to insert current time)  Test: Calcium Calcium Critical Value: 6.4  Name of Provider Notified: Pola Corn  Orders Received? Or Actions Taken?:  Awaiting Orders

## 2022-11-27 NOTE — Progress Notes (Signed)
PHARMACY - PHYSICIAN COMMUNICATION CRITICAL VALUE ALERT - BLOOD CULTURE IDENTIFICATION (BCID)  Alyssa Ford is an 72 y.o. female who presented to [facility] on 11/26/2022 with a chief complaint of sinus congestion with left sided pain.  Assessment:   Septic shock secondary to PNA BCID + Strep pneumoniae  Name of physician (or Provider) Contacted: Paliwal  Current antibiotics: Rocephin + Zithromax  Changes to prescribed antibiotics recommended:  Patient is on recommended antibiotics - No changes needed  Results for orders placed or performed during the hospital encounter of 11/26/22  Blood Culture ID Panel (Reflexed) (Collected: 11/26/2022 12:56 PM)  Result Value Ref Range   Enterococcus faecalis NOT DETECTED NOT DETECTED   Enterococcus Faecium NOT DETECTED NOT DETECTED   Listeria monocytogenes NOT DETECTED NOT DETECTED   Staphylococcus species NOT DETECTED NOT DETECTED   Staphylococcus aureus (BCID) NOT DETECTED NOT DETECTED   Staphylococcus epidermidis NOT DETECTED NOT DETECTED   Staphylococcus lugdunensis NOT DETECTED NOT DETECTED   Streptococcus species DETECTED (A) NOT DETECTED   Streptococcus agalactiae NOT DETECTED NOT DETECTED   Streptococcus pneumoniae DETECTED (A) NOT DETECTED   Streptococcus pyogenes NOT DETECTED NOT DETECTED   A.calcoaceticus-baumannii NOT DETECTED NOT DETECTED   Bacteroides fragilis NOT DETECTED NOT DETECTED   Enterobacterales NOT DETECTED NOT DETECTED   Enterobacter cloacae complex NOT DETECTED NOT DETECTED   Escherichia coli NOT DETECTED NOT DETECTED   Klebsiella aerogenes NOT DETECTED NOT DETECTED   Klebsiella oxytoca NOT DETECTED NOT DETECTED   Klebsiella pneumoniae NOT DETECTED NOT DETECTED   Proteus species NOT DETECTED NOT DETECTED   Salmonella species NOT DETECTED NOT DETECTED   Serratia marcescens NOT DETECTED NOT DETECTED   Haemophilus influenzae NOT DETECTED NOT DETECTED   Neisseria meningitidis NOT DETECTED NOT DETECTED   Pseudomonas  aeruginosa NOT DETECTED NOT DETECTED   Stenotrophomonas maltophilia NOT DETECTED NOT DETECTED   Candida albicans NOT DETECTED NOT DETECTED   Candida auris NOT DETECTED NOT DETECTED   Candida glabrata NOT DETECTED NOT DETECTED   Candida krusei NOT DETECTED NOT DETECTED   Candida parapsilosis NOT DETECTED NOT DETECTED   Candida tropicalis NOT DETECTED NOT DETECTED   Cryptococcus neoformans/gattii NOT DETECTED NOT DETECTED    Junita Push PharmD 11/27/2022  4:54 AM

## 2022-11-27 NOTE — Progress Notes (Signed)
Date and time results received: 11/27/22 0406 (use smartphrase ".now" to insert current time)  Test: Magnesium Critical Value: 0.6  Name of Provider Notified: Pola Corn  Orders Received? Or Actions Taken?: Orders Received - See Orders for details and awaiting orders

## 2022-11-27 NOTE — Progress Notes (Signed)
Pt bladder scanned at 2200, results showed ,  bladder scanned again at 0500, results showed 388.  Intermittent catheterization performed with fellow RN Beverly Gust and obtained clear, amber urine.  PT related that she had a c-spine surgery in 2015 and has had difficulty urinating since that time.  This is the first intermittent catheterization this admission.

## 2022-11-27 NOTE — Progress Notes (Signed)
11/27/2022 APP Hazel Sams and I saw and evaluated the patient. Discussed with them and agree with their findings and plan as documented in the their note.  I have seen and evaluated the patient for severe CAP, shock  S:  Remains on low dose levo  O: Blood pressure 130/74, pulse 92, temperature (!) 97.4 F (36.3 C), temperature source Axillary, resp. rate (!) 21, height 5\' 6"  (1.676 m), weight 69.8 kg, SpO2 97 %.   Exam: Alert, follows commands, grossly nonfocal Rhonchi L>R, normal WOB   Labs/imaging reviewed   A:  # Severe CAP # Septic shock due to severe CAP # AKI due to sepsis  P:  - ceftriaxone - wean levo for map 65 - start stress dose steroids for severe pneumonia, septic shock - as long as she continues to show clinical improvement no need for repeat BCx   Patient critically ill due to septic shock  Interventions to address this today oversight of vasopressor titration Risk of deterioration without these interventions is high  I personally spent 35 minutes providing critical care not including any separately billable procedures   Laroy Apple Pulmonary/Critical Care

## 2022-11-27 NOTE — Progress Notes (Addendum)
eLink Physician-Brief Progress Note Patient Name: Alyssa Ford DOB: [birth year] MRN: [id number]   Date of Service  11/27/2022  HPI/Events of Note  Septic Shock secondary to pneumonia Critical Mg 0.6 and Calcium 6.7, Albumin 4.1  eICU Interventions  Repeat labs to verify Magnesium and Calcium ordered   0500 -blood culture positive for strep pneumonia DNA ID.  Will obtain repeat cultures at 24 hours.  Already on ceftriaxone and azithromycin.  Intervention Category Intermediate Interventions: Electrolyte abnormality - evaluation and management  Suhail Peloquin 11/27/2022, 4:15 AM

## 2022-11-27 NOTE — Progress Notes (Signed)
NAME:  Alyssa Ford, MRN:  [id number], DOB:  1951-06-12, LOS: 1 ADMISSION DATE:  11/26/2022, CONSULTATION DATE:  11/26/2022 REFERRING MD:  Dr. Anitra Lauth, ER , CHIEF COMPLAINT:  Septic shock   History of Present Illness:  72 yo female former smoker presented [facility] with left side pain.  This started on 5/31 and progressed.  She had temp 87F at home.  She was having sinus congestion before this, and also had nausea and an episode of diarrhea.  In the ER SpO2 was 88% and BP was 67/41.  CXR showed pneumonia.  She was started on supplemental oxygen, ABx, IV fluids and pressors.  PCCM asked to arrange for admission to ICU.  Pertinent  Medical History  Anemia, Anxiety, OA, Cataract, Headaches, Allergies, Depression, DM type 2, Diverticulosis, Eczema, GERD, Glaucoma, HH, HLD, HTN, Thyroid nodule, lung nodule  Significant Hospital Events: Including procedures, antibiotic start and stop dates in addition to other pertinent events   6/02 Admit, start ABx and pressors 6/3 urinary retention overnight, still on pressors   Studies:  CT abd/pelvis 6/02 >> consolidation LLL, fatty liver, atherosclerosis  Interim History / Subjective:  Denies headache, sore throat, skin rash, joint swelling, or leg swelling.  She was around her grandchildren "who always having something".  Objective   Blood pressure 126/63, pulse 89, temperature (!) 97.3 F (36.3 C), temperature source Axillary, resp. rate 17, height 5\' 6"  (1.676 m), weight 69.8 kg, SpO2 92 %.        Intake/Output Summary (Last 24 hours) at 11/27/2022 0753 Last data filed at 11/27/2022 0600 Gross per 24 hour  Intake 3820.07 ml  Output 600 ml  Net 3220.07 ml    Filed Weights   11/26/22 1248 11/26/22 1705 11/27/22 0500  Weight: 67.1 kg 67.9 kg 69.8 kg    Examination: General: ill appearing older adult female, lying in bed  HENT: Normocephalic, Pink Moist MM, teeth intact  Cardio: s1, s2, auscultated, RRR, no m/r/g Pulm: CTA to  diminished, mild tachypnea, no respiratory distress  Abd: BS active  GU: intact  Skin: warm, dry  Extremities: no edema, moves extremities  Neuro: Alert and oriented x4, follows commands   Resolved Hospital Problem list   Hypokalemia   Assessment & Plan:   Septic shock with lactic acidosis from in setting CAP Lt lower lobar pneumonia. day 2 of rocephin, zithromax  P: Continue IV fluids  Continue antibiotics  Continue MAP goal of >65  Continue to utilize peripheral levophed, titrate off if MAP tolerates  DC LR 19ml/hr  Pending urine legionella, and strep pneumo AG  Repeat Lactic acid   Acute hypoxic respiratory failure in setting of CAP On 2L [place]  P: Continue Spo2 > 92  Continue pulmonary hygiene Mobilize out of bed Follow CXR intermittently  Placed order for IS   AKI from ATN secondary to septic shock   baseline creatinine 0.91 from 10/19/22 Cr 3.08  Urinary retention overnight, I/o x 1  P: Continue Follow renal function / urine output Continue to Trend Bmet Avoid nephrotoxins, ensure adequate renal perfusion  Continue IV hydration Continue bladder scans q6hrs   Hypomagnesemia  Initial mag 0.6  P: Replace with another 2g of IV mag   DM type 2 poorly controlled with hyperglycemia. P: Continue SSI Continue CBG q4 Continue to hold metformin, prandin, rybelus until discharge   Hx of HTN Pt hypotensive on pressors  P:  Continue to hold antihypertensives   Hx of HLD  P:  Continue ASA, fenofibrate, and crestor   Hx of glaucoma. P: Continue xalatan   Hx of Eczema, allergies. P:  Continue azelastine, Claritin, and Flonase  Supportive care   Hx of GERD. P: Continue PO protonix  Best Practice (right click and "Reselect all SmartList Selections" daily)   Diet/type: Regular consistency (see orders) DVT prophylaxis: prophylactic heparin  GI prophylaxis: PPI Lines: N/A Foley:  N/A Code Status:  full code Last date of multidisciplinary goals of care  discussion [x]    Review of Systems:   Reviewed and negative  Past Medical History:  She,  has a past medical history of Anemia, Anxiety, Arthritis, Cataract, Chronic headaches, Depression, Diabetes mellitus, Diverticulosis of colon (without mention of hemorrhage) (2003, 2008), Eczema, GERD (gastroesophageal reflux disease), Glaucoma, Hiatal hernia (2003), Hyperlipidemia, Hypertension, and Thyroid nodule.   Surgical History:   Past Surgical History:  Procedure Laterality Date   CARPAL TUNNEL RELEASE  2006   CARPAL TUNNEL RELEASE  12/24/2007   CATARACT EXTRACTION, BILATERAL     [place]---  sept and oct 2018   CERVICAL FUSION  3/08   CHOLECYSTECTOMY     FASCIECTOMY Left 08/01/2018   Procedure: FASCIECTOMY LEFT RING;  Surgeon: Cindee Salt, MD;  Location: [facility];  Service: Orthopedics;  Laterality: Left;  UPPER ARM   TRIGGER FINGER RELEASE Left 08/01/2018   Procedure: RELEASE TRIGGER FINGER/A-1 PULLEY LEFTRING;  Surgeon: Cindee Salt, MD;  Location: [facility];  Service: Orthopedics;  Laterality: Left;     Social History:   reports that she quit smoking about 7 years ago. Her smoking use included cigarettes. She has a 68.60 pack-year smoking history. She has been exposed to tobacco smoke. She has never used smokeless tobacco. She reports current alcohol use. She reports that she does not use drugs.   Family History:  Her family history includes Angioedema in her mother; Breast cancer (age of onset: 75) in her mother; Cancer (age of onset: 38) in her mother; Dementia in her mother; Diabetes in her brother and mother; Diabetes (age of onset: 55) in her father; Heart attack in her brother; Heart disease (age of onset: 97) in her cousin; Hyperlipidemia in her mother; Hypertension in her brother, mother, and sister; Kidney failure (age of onset: 74) in her mother; Parkinsonism in her father. There is no history of Colon cancer, Esophageal cancer, or Stomach cancer.    Allergies Allergies  Allergen Reactions   Codeine Nausea Only    Severe nausea     Home Medications  Prior to Admission medications   Medication Sig Start Date End Date Taking? Authorizing Provider  amitriptyline (ELAVIL) 25 MG tablet Take 2 tablets (50 mg total) by mouth daily at 12 noon. Patient taking differently: Take 50 mg by mouth at bedtime. 03/07/22   Donato Schultz, DO  aspirin EC 81 MG tablet Take 81 mg by mouth daily.    [name], Yvonne R, DO  azelastine (ASTELIN) 0.1 % nasal spray Place 1 spray into both nostrils 2 (two) times daily. Use in each nostril as directed 06/02/22   [name], Myrene Buddy R, DO  BIOTIN PO Take 1 tablet by mouth daily.     [provider]  Coenzyme Q10 (CO Q 10 PO) Take 1 capsule by mouth daily.    [provider]  fenofibrate 160 MG tablet Take 1 tablet (160 mg total) by mouth daily. 06/05/22   Donato Schultz, DO  Ferrous Sulfate (IRON SUPPLEMENT PO) Take 1  tablet by mouth daily. 65mg     [provider]  fluticasone (FLONASE) 50 MCG/ACT nasal spray Place 2 sprays into both nostrils daily. 07/28/22   [name], MD  ipratropium (ATROVENT) 0.06 % nasal spray Place 2 sprays into both nostrils 3 (three) times daily as needed for rhinitis. 07/28/22   [name], MD  latanoprost (XALATAN) 0.005 % ophthalmic solution Place 1 drop into both eyes at bedtime.  10/17/13   [provider]  levocetirizine (XYZAL) 5 MG tablet Take 1 tablet (5 mg total) by mouth every evening. 07/28/22   [name], MD  lisinopril (ZESTRIL) 5 MG tablet TAKE 1 TABLET(5 MG) BY MOUTH DAILY 11/13/22   [name], Grayling Congress, DO  Melatonin 10 MG TABS Take 1 tablet by mouth at bedtime as needed (sleep).    [provider]  metFORMIN (GLUCOPHAGE-XR) 500 MG 24 hr tablet Take 4 pills every day 10/24/22   Altamese [place], MD  Naproxen Sodium (ALEVE PO) Take 2 tablets by mouth daily.     [provider]  NONFORMULARY OR  COMPOUNDED ITEM Cbd /delta 9 1 po qhs 06/02/22   [name], Grayling Congress, DO  omeprazole (PRILOSEC) 20 MG capsule Take 1 capsule (20 mg total) by mouth daily. 07/14/22   [name], DO  OVER THE COUNTER MEDICATION Take 1 each by mouth at bedtime as needed (sleep). CBD Gummy    [provider]  repaglinide (PRANDIN) 1 MG tablet Take 2 tablets (2 mg total) by mouth daily. Before evening meal. 10/24/22   Motwani, Carin Hock, MD  rosuvastatin (CRESTOR) 40 MG tablet Take 1 tablet (40 mg total) by mouth at bedtime. 11/21/22   Donato Schultz, DO  Semaglutide (RYBELSUS) 3 MG TABS 1 tablet daily 10/24/22   Altamese [place], MD     Critical care time:   CRITICAL CARE Performed by: Rochel Brome S-ACNP    Total critical care time: 40 minutes  Critical care time was exclusive of separately billable procedures and treating other patients.  Critical care was necessary to treat or prevent imminent or life-threatening deterioration.  Critical care was time spent personally by me on the following activities: development of treatment plan with patient and/or surrogate as well as nursing, discussions with consultants, evaluation of patient's response to treatment, examination of patient, obtaining history from patient or surrogate, ordering and performing treatments and interventions, ordering and review of laboratory studies, ordering and review of radiographic studies, pulse oximetry and re-evaluation of patient's condition.

## 2022-11-28 ENCOUNTER — Inpatient Hospital Stay (HOSPITAL_COMMUNITY): Payer: Medicare Other

## 2022-11-28 DIAGNOSIS — J189 Pneumonia, unspecified organism: Secondary | ICD-10-CM | POA: Diagnosis not present

## 2022-11-28 LAB — BASIC METABOLIC PANEL
Anion gap: 18 — ABNORMAL HIGH (ref 5–15)
BUN: 62 mg/dL — ABNORMAL HIGH (ref 8–23)
CO2: 17 mmol/L — ABNORMAL LOW (ref 22–32)
Calcium: 6.9 mg/dL — ABNORMAL LOW (ref 8.9–10.3)
Chloride: 93 mmol/L — ABNORMAL LOW (ref 98–111)
Creatinine, Ser: 3.03 mg/dL — ABNORMAL HIGH (ref 0.44–1.00)
GFR, Estimated: 16 mL/min — ABNORMAL LOW (ref >60)
Glucose, Bld: 112 mg/dL — ABNORMAL HIGH (ref 70–99)
Potassium: 3.5 mmol/L (ref 3.5–5.1)
Sodium: 128 mmol/L — ABNORMAL LOW (ref 135–145)

## 2022-11-28 LAB — GLUCOSE, CAPILLARY
Glucose-Capillary: 92 mg/dL (ref 70–99)
Glucose-Capillary: 136 mg/dL — ABNORMAL HIGH (ref 70–99)
Glucose-Capillary: 182 mg/dL — ABNORMAL HIGH (ref 70–99)

## 2022-11-28 LAB — CBC
HCT: 34.4 % — ABNORMAL LOW (ref 36.0–46.0)
Hemoglobin: 11.3 g/dL — ABNORMAL LOW (ref 12.0–15.0)
MCH: 28.6 pg (ref 26.0–34.0)
MCHC: 32.8 g/dL (ref 30.0–36.0)
MCV: 87.1 fL (ref 80.0–100.0)
Platelets: 197 10*3/uL (ref 150–400)
RBC: 3.95 MIL/uL (ref 3.87–5.11)
RDW: 14.3 % (ref 11.5–15.5)
WBC: 12 10*3/uL — ABNORMAL HIGH (ref 4.0–10.5)
nRBC: 0 % (ref 0.0–0.2)

## 2022-11-28 LAB — CULTURE, BLOOD (ROUTINE X 2)
Culture: NO GROWTH
Special Requests: ADEQUATE

## 2022-11-28 LAB — LEGIONELLA PNEUMOPHILA SEROGP 1 UR AG: L. pneumophila Serogp 1 Ur Ag: NEGATIVE

## 2022-11-28 MED ORDER — SIMETHICONE 80 MG PO CHEW
80.0000 mg | CHEWABLE_TABLET | Freq: Four times a day (QID) | ORAL | Status: DC | PRN
  Administered 2022-11-28 – 2022-11-29 (×2): 80 mg via ORAL
  Filled 2022-11-28 (×2): qty 1

## 2022-11-28 MED ORDER — DIPHENHYDRAMINE HCL 50 MG/ML IJ SOLN
12.5000 mg | Freq: Once | INTRAMUSCULAR | Status: AC
  Administered 2022-11-28: 12.5 mg via INTRAVENOUS
  Filled 2022-11-28: qty 1

## 2022-11-28 NOTE — Progress Notes (Signed)
NAME:  Alyssa Ford, MRN:  [id number], DOB:  21-Jun-1951, LOS: 2 ADMISSION DATE:  11/26/2022, CONSULTATION DATE:  11/26/2022 REFERRING MD:  Dr. Anitra Lauth, ER , CHIEF COMPLAINT:  Septic shock   History of Present Illness:  72 yo female former smoker presented [facility] with left side pain.  This started on 5/31 and progressed.  She had temp 58F at home.  She was having sinus congestion before this, and also had nausea and an episode of diarrhea.  In the ER SpO2 was 88% and BP was 67/41.  CXR showed pneumonia.  She was started on supplemental oxygen, ABx, IV fluids and pressors.  PCCM asked to arrange for admission to ICU.  Pertinent  Medical History  Anemia, Anxiety, OA, Cataract, Headaches, Allergies, Depression, DM type 2, Diverticulosis, Eczema, GERD, Glaucoma, HH, HLD, HTN, Thyroid nodule, lung nodule  Significant Hospital Events: Including procedures, antibiotic start and stop dates in addition to other pertinent events   6/02 Admit, start ABx and pressors 6/3 urinary retention overnight, still on pressors   Studies:  CT abd/pelvis 6/02 >> consolidation LLL, fatty liver, atherosclerosis  Interim History / Subjective:  Making some urine  Pressors off  Had trouble sleeping last night  Objective   Blood pressure 130/82, pulse 98, temperature 97.6 F (36.4 C), temperature source Oral, resp. rate 18, height 5\' 6"  (1.676 m), weight 70.9 kg, SpO2 95 %.        Intake/Output Summary (Last 24 hours) at 11/28/2022 4098 Last data filed at 11/28/2022 0800 Gross per 24 hour  Intake 453.5 ml  Output 1950 ml  Net -1496.5 ml   Filed Weights   11/26/22 1705 11/27/22 0500 11/28/22 0500  Weight: 67.9 kg 69.8 kg 70.9 kg    Examination: General appearance: 72 y.o., female, NAD, conversant  Eyes: anicteric sclerae; PERRL, tracking appropriately HENT: NCAT; MMM Neck: Trachea midline; no lymphadenopathy, no JVD Lungs: diminished bl, with normal respiratory effort on RA CV: RRR, no  murmur  Abdomen: Soft, non-tender; non-distended, BS present  Extremities: No peripheral edema, warm Skin: Normal turgor and texture; no rash Neuro: grossly without focal deficit    Resolved Hospital Problem list   Hypokalemia   Assessment & Plan:   Septic shock with lactic acidosis from in setting CAP, shock resolved Lt lower lobar pneumonia. day 2 of rocephin, zithromax  P: Continue ceftriaxone for 7d course Continue solucortef 50q6 for 5 days then ok to discontinue without taper   Acute hypoxic respiratory failure in setting of CAP, improving On 2L [place]  P: ABX as above Wean [place] for Spo2 > 92  Continue pulmonary hygiene Mobilize out of bed  Non-oliguric AKI from ATN secondary to septic shock  Urinary retention on presentation as well requiring I/O cath x1 P: Continue Follow renal function / urine output Continue to Trend Bmet Avoid nephrotoxins, ensure adequate renal perfusion  Continue IV hydration Continue bladder scans q6hrs   Hypomagnesemia  Initial mag 0.6  P: Replace with another 2g of IV mag   DM type 2 poorly controlled with hyperglycemia. P: Continue SSI Continue CBG q4 Continue to hold metformin, prandin, rybelus until discharge   Hx of HTN P:  Continue to hold antihypertensives   Hx of HLD  P: Continue ASA, fenofibrate, and crestor   Hx of glaucoma. P: Continue xalatan   Hx of Eczema, allergies. P:  Continue azelastine, Claritin, and Flonase  Supportive care   Hx of GERD. P: Continue PO protonix  Best  Practice (right click and "Reselect all SmartList Selections" daily)   Diet/type: Regular consistency (see orders) DVT prophylaxis: prophylactic heparin  GI prophylaxis: PPI Lines: N/A Foley:  N/A Code Status:  full code Last date of multidisciplinary goals of care discussion [x]    Review of Systems:   Reviewed and negative  Past Medical History:  She,  has a past medical history of Anemia, Anxiety, Arthritis, Cataract,  Chronic headaches, Depression, Diabetes mellitus, Diverticulosis of colon (without mention of hemorrhage) (2003, 2008), Eczema, GERD (gastroesophageal reflux disease), Glaucoma, Hiatal hernia (2003), Hyperlipidemia, Hypertension, and Thyroid nodule.   Surgical History:   Past Surgical History:  Procedure Laterality Date   CARPAL TUNNEL RELEASE  2006   CARPAL TUNNEL RELEASE  12/24/2007   CATARACT EXTRACTION, BILATERAL     [place]---  sept and oct 2018   CERVICAL FUSION  3/08   CHOLECYSTECTOMY     FASCIECTOMY Left 08/01/2018   Procedure: FASCIECTOMY LEFT RING;  Surgeon: Cindee Salt, MD;  Location: [facility];  Service: Orthopedics;  Laterality: Left;  UPPER ARM   TRIGGER FINGER RELEASE Left 08/01/2018   Procedure: RELEASE TRIGGER FINGER/A-1 PULLEY LEFTRING;  Surgeon: Cindee Salt, MD;  Location: [facility];  Service: Orthopedics;  Laterality: Left;     Social History:   reports that she quit smoking about 7 years ago. Her smoking use included cigarettes. She has a 68.60 pack-year smoking history. She has been exposed to tobacco smoke. She has never used smokeless tobacco. She reports current alcohol use. She reports that she does not use drugs.   Family History:  Her family history includes Angioedema in her mother; Breast cancer (age of onset: 70) in her mother; Cancer (age of onset: 68) in her mother; Dementia in her mother; Diabetes in her brother and mother; Diabetes (age of onset: 14) in her father; Heart attack in her brother; Heart disease (age of onset: 99) in her cousin; Hyperlipidemia in her mother; Hypertension in her brother, mother, and sister; Kidney failure (age of onset: 39) in her mother; Parkinsonism in her father. There is no history of Colon cancer, Esophageal cancer, or Stomach cancer.   Allergies Allergies  Allergen Reactions   Codeine Nausea Only    Severe nausea     Home Medications  Prior to Admission medications   Medication Sig Start  Date End Date Taking? Authorizing Provider  amitriptyline (ELAVIL) 25 MG tablet Take 2 tablets (50 mg total) by mouth daily at 12 noon. Patient taking differently: Take 50 mg by mouth at bedtime. 03/07/22   Donato Schultz, DO  aspirin EC 81 MG tablet Take 81 mg by mouth daily.    [name], Yvonne R, DO  azelastine (ASTELIN) 0.1 % nasal spray Place 1 spray into both nostrils 2 (two) times daily. Use in each nostril as directed 06/02/22   [name], Myrene Buddy R, DO  BIOTIN PO Take 1 tablet by mouth daily.     [provider]  Coenzyme Q10 (CO Q 10 PO) Take 1 capsule by mouth daily.    [provider]  fenofibrate 160 MG tablet Take 1 tablet (160 mg total) by mouth daily. 06/05/22   Donato Schultz, DO  Ferrous Sulfate (IRON SUPPLEMENT PO) Take 1 tablet by mouth daily. 65mg     [provider]  fluticasone (FLONASE) 50 MCG/ACT nasal spray Place 2 sprays into both nostrils daily. 07/28/22   [name], MD  ipratropium (ATROVENT) 0.06 % nasal spray Place  2 sprays into both nostrils 3 (three) times daily as needed for rhinitis. 07/28/22   [name], MD  latanoprost (XALATAN) 0.005 % ophthalmic solution Place 1 drop into both eyes at bedtime.  10/17/13   [provider]  levocetirizine (XYZAL) 5 MG tablet Take 1 tablet (5 mg total) by mouth every evening. 07/28/22   [name], MD  lisinopril (ZESTRIL) 5 MG tablet TAKE 1 TABLET(5 MG) BY MOUTH DAILY 11/13/22   [name], Grayling Congress, DO  Melatonin 10 MG TABS Take 1 tablet by mouth at bedtime as needed (sleep).    [provider]  metFORMIN (GLUCOPHAGE-XR) 500 MG 24 hr tablet Take 4 pills every day 10/24/22   Altamese [place], MD  Naproxen Sodium (ALEVE PO) Take 2 tablets by mouth daily.     [provider]  NONFORMULARY OR COMPOUNDED ITEM Cbd /delta 9 1 po qhs 06/02/22   [name], Grayling Congress, DO  omeprazole (PRILOSEC) 20 MG capsule Take 1 capsule (20 mg total) by mouth daily. 07/14/22    [name], DO  OVER THE COUNTER MEDICATION Take 1 each by mouth at bedtime as needed (sleep). CBD Gummy    [provider]  repaglinide (PRANDIN) 1 MG tablet Take 2 tablets (2 mg total) by mouth daily. Before evening meal. 10/24/22   Motwani, Carin Hock, MD  rosuvastatin (CRESTOR) 40 MG tablet Take 1 tablet (40 mg total) by mouth at bedtime. 11/21/22   Donato Schultz, DO  Semaglutide (RYBELSUS) 3 MG TABS 1 tablet daily 10/24/22   Altamese [place], MD     Critical care time: na

## 2022-11-28 NOTE — Progress Notes (Addendum)
eLink Physician-Brief Progress Note Patient Name: Alyssa Ford DOB: 1950-11-22 MRN: [id number]   Date of Service  11/28/2022  HPI/Events of Note  72 year old female with septic shock secondary to pneumonia with acute kidney injury  -Significant bloating, takes Gas-X at home  eICU Interventions  Ordered simethicone as needed   0540 - repeat benadryl for itching.  Intervention Category Minor Interventions: Routine modifications to care plan (e.g. PRN medications for pain, fever)  Knowledge [place] 11/28/2022, 12:37 AM

## 2022-11-29 DIAGNOSIS — N179 Acute kidney failure, unspecified: Secondary | ICD-10-CM | POA: Diagnosis not present

## 2022-11-29 DIAGNOSIS — A419 Sepsis, unspecified organism: Secondary | ICD-10-CM | POA: Diagnosis not present

## 2022-11-29 DIAGNOSIS — J189 Pneumonia, unspecified organism: Secondary | ICD-10-CM | POA: Diagnosis not present

## 2022-11-29 DIAGNOSIS — R6521 Severe sepsis with septic shock: Secondary | ICD-10-CM | POA: Diagnosis not present

## 2022-11-29 LAB — BASIC METABOLIC PANEL
Anion gap: 16 — ABNORMAL HIGH (ref 5–15)
BUN: 60 mg/dL — ABNORMAL HIGH (ref 8–23)
CO2: 20 mmol/L — ABNORMAL LOW (ref 22–32)
Calcium: 7.7 mg/dL — ABNORMAL LOW (ref 8.9–10.3)
Chloride: 103 mmol/L (ref 98–111)
Creatinine, Ser: 2.39 mg/dL — ABNORMAL HIGH (ref 0.44–1.00)
GFR, Estimated: 21 mL/min — ABNORMAL LOW (ref >60)
Glucose, Bld: 158 mg/dL — ABNORMAL HIGH (ref 70–99)
Potassium: 2.8 mmol/L — ABNORMAL LOW (ref 3.5–5.1)
Sodium: 139 mmol/L (ref 135–145)

## 2022-11-29 LAB — GLUCOSE, CAPILLARY
Glucose-Capillary: 149 mg/dL — ABNORMAL HIGH (ref 70–99)
Glucose-Capillary: 182 mg/dL — ABNORMAL HIGH (ref 70–99)
Glucose-Capillary: 153 mg/dL — ABNORMAL HIGH (ref 70–99)
Glucose-Capillary: 184 mg/dL — ABNORMAL HIGH (ref 70–99)

## 2022-11-29 LAB — CULTURE, BLOOD (ROUTINE X 2): Culture: NO GROWTH

## 2022-11-29 LAB — CBC
HCT: 33.5 % — ABNORMAL LOW (ref 36.0–46.0)
Hemoglobin: 11.4 g/dL — ABNORMAL LOW (ref 12.0–15.0)
MCH: 28.3 pg (ref 26.0–34.0)
MCHC: 34 g/dL (ref 30.0–36.0)
MCV: 83.1 fL (ref 80.0–100.0)
Platelets: 221 10*3/uL (ref 150–400)
RBC: 4.03 MIL/uL (ref 3.87–5.11)
RDW: 14.1 % (ref 11.5–15.5)
WBC: 10.7 10*3/uL — ABNORMAL HIGH (ref 4.0–10.5)
nRBC: 0 % (ref 0.0–0.2)

## 2022-11-29 LAB — MAGNESIUM: Magnesium: 1.8 mg/dL (ref 1.7–2.4)

## 2022-11-29 MED ORDER — LACTATED RINGERS IV SOLN: INTRAVENOUS | Status: AC

## 2022-11-29 MED ORDER — BISACODYL 10 MG RE SUPP
10.0000 mg | Freq: Once | RECTAL | Status: AC
  Administered 2022-11-29: 10 mg via RECTAL
  Filled 2022-11-29: qty 1

## 2022-11-29 MED ORDER — OXYMETAZOLINE HCL 0.05 % NA SOLN
1.0000 | Freq: Once | NASAL | Status: AC
  Administered 2022-11-29: 1 via NASAL
  Filled 2022-11-29: qty 15

## 2022-11-29 MED ORDER — POTASSIUM CHLORIDE 20 MEQ PO PACK
40.0000 meq | PACK | Freq: Three times a day (TID) | ORAL | Status: AC
  Administered 2022-11-29 (×3): 40 meq via ORAL
  Filled 2022-11-29 (×3): qty 2

## 2022-11-29 NOTE — Progress Notes (Signed)
.  Transition of Care [facility]) - Inpatient Brief Assessment   Patient Details  Name: Alyssa Ford MRN: [id number] Date of Birth: 1951-02-15  Transition of Care [facility]) CM/SW Contact:    Larrie Kass, LCSW Phone Number: 11/29/2022, 3:08 PM   Clinical Narrative:  Transition of Care Department [facility]) has reviewed patient and no TOC needs have been identified at this time. We will continue to monitor patient advancement through interdisciplinary progression rounds. If new patient transition needs arise, please place a TOC consult.  Transition of Care Asessment: Insurance and Status: Insurance coverage has been reviewed Patient has primary care physician: Yes     Prior/Current Home Services: No current home services Social Determinants of Health Reivew: SDOH reviewed no interventions necessary Readmission risk has been reviewed: No Transition of care needs: no transition of care needs at this time

## 2022-11-29 NOTE — Progress Notes (Signed)
[facility]  PROGRESS NOTE  Alyssa Ford WUJ:[id number] DOB: 04/03/1951 DOA: 11/26/2022 PCP: Donato Schultz, DO   Brief HPI:   72 -[facility]-sided [facility].Patient said pain started on 5/31, also had temperature of 99 F at home.  Patient had sinus congestion and episode of diarrhea.  In the ER SpO2 was 88% and blood pressure was 67/41.  Chest x-ray showed left lower lobe pneumonia.  Patient started on IV antibiotics.  He was admitted to ICU.  Required pressors, she was weaned off pressors and transferred to medical floor.  [facility] assumed care on 11/29/2022.    Assessment/Plan:    Septic shock with lactic acidosis in setting of community-acquired pneumonia -Left lower lobe pneumonia -Blood cultures positive for Streptococcus pneumonia -Urine for Legionella negative, urine for strep pneumo antigen positive -Continue IV ceftriaxone for 7 days -Currently on Solu-Cortef 100 mg every 12 hours, being weaned off.  Stop after 5 days on 12/02/2022  Acute hypoxemic respiratory failure -In the setting of pneumonia -Initially required 2 L of oxygen by nasal cannula, weaned off to room air  Nonoliguric AKI from ATN secondary to septic shock -Urinary retention on presentation -Improving -Creatinine is down to 2.39  Hypomagnesemia -Replete  Hypokalemia -Potassium is 2.8, will replace potassium and follow BMP in am  Diabetes mellitus type 2 -Continue sliding scale insulin NovoLog -CBG well-controlled  History of hypertension -Antihypertensive medications on hold  History of glaucoma -Continue xalatan  History of hyperlipidemia -Continue aspirin, fenofibrate, Crestor  History of GERD -Continue Protonix      Medications     aspirin EC  81 mg Oral Daily   azelastine  1 spray Each Nare BID   Chlorhexidine Gluconate Cloth  6 each Topical Daily   fenofibrate  160 mg Oral Daily   fluticasone  2 spray Each Nare Daily   heparin   5,000 Units Subcutaneous Q8H   hydrocortisone sod succinate (SOLU-CORTEF) inj  100 mg Intravenous Q12H   insulin aspart  0-20 Units Subcutaneous TID WC   insulin aspart  0-5 Units Subcutaneous QHS   latanoprost  1 drop Both Eyes QHS   loratadine  10 mg Oral q1800   pantoprazole  20 mg Oral Daily   rosuvastatin  40 mg Oral QHS     Data Reviewed:   CBG:  Recent Labs  Lab 11/27/22 2206 11/28/22 0812 11/28/22 1151 11/28/22 2317 11/29/22 0750  GLUCAP 110* 92 136* 182* 149*    SpO2: 97 % O2 Flow Rate (L/min): 2 L/min    Vitals:   11/29/22 0136 11/29/22 0415 11/29/22 0500 11/29/22 0549  BP: (!) 156/89   127/87  Pulse: 82   (!) 43  Resp: 16 18  18   Temp: 97.9 F (36.6 C)   98 F (36.7 C)  TempSrc: Oral   Oral  SpO2: 99%   97%  Weight:   70.9 kg   Height:          Data Reviewed:  Basic Metabolic Panel: Recent Labs  Lab 11/26/22 1254 11/26/22 1807 11/27/22 0320 11/27/22 0749 11/28/22 0309 11/29/22 0413  NA 136  --  132* 131* 128* 139  K 3.2*  --  4.2 3.8 3.5 2.8*  CL 94*  --  96* 95* 93* 103  CO2 17*  --  20* 19* 17* 20*  GLUCOSE 106*  --  84 72 112* 158*  BUN 47*  --  59* 60* 62* 60*  CREATININE  3.14* 2.81* 3.09* 3.26* 3.03* 2.39*  CALCIUM 8.3*  --  6.4* 7.0* 6.9* 7.7*  MG  --   --  0.6* 1.2*  --   --   PHOS  --   --  8.7*  --   --   --     CBC: Recent Labs  Lab 11/26/22 1254 11/26/22 1807 11/27/22 0320 11/28/22 0309 11/29/22 0413  WBC 7.6 7.5 11.6* 12.0* 10.7*  HGB 13.9 12.2 11.5* 11.3* 11.4*  HCT 41.9 38.7 35.5* 34.4* 33.5*  MCV 85.7 91.7 88.3 87.1 83.1  PLT 275 207 234 197 221    LFT Recent Labs  Lab 11/26/22 1254 11/27/22 0749  AST 20 20  ALT 19 19  ALKPHOS 39 43  BILITOT 0.5 0.6  PROT 7.5 6.1*  ALBUMIN 4.1 2.7*     Antibiotics: Anti-infectives (From admission, onward)    Start     Dose/Rate Route Frequency Ordered Stop   11/27/22 1600  azithromycin (ZITHROMAX) 500 mg in sodium chloride 0.9 % 250 mL IVPB  Status:   Discontinued        500 mg 250 mL/hr over 60 Minutes Intravenous Every 24 hours 11/26/22 1758 11/27/22 1207   11/27/22 1400  cefTRIAXone (ROCEPHIN) 2 g in sodium chloride 0.9 % 100 mL IVPB        2 g 200 mL/hr over 30 Minutes Intravenous Every 24 hours 11/26/22 1758     11/26/22 1530  azithromycin (ZITHROMAX) 500 mg in sodium chloride 0.9 % 250 mL IVPB        500 mg 250 mL/hr over 60 Minutes Intravenous  Once 11/26/22 1515 11/26/22 1612   11/26/22 1315  cefTRIAXone (ROCEPHIN) 2 g in sodium chloride 0.9 % 100 mL IVPB        2 g 200 mL/hr over 30 Minutes Intravenous  Once 11/26/22 1312 11/26/22 1405   11/26/22 1315  metroNIDAZOLE (FLAGYL) IVPB 500 mg        500 mg 100 mL/hr over 60 Minutes Intravenous  Once 11/26/22 1312 11/26/22 1440        DVT prophylaxis: Heparin  Code Status: Full code  Family Communication: Discussed with patient's daughter at bedside   CONSULTS PCCM   Subjective   Denies chest pain or shortness of breath.  Ambulating in the hallways.   Objective    Physical Examination:  General-appears in no acute distress Heart-S1-S2, regular, no murmur auscultated Lungs-left lower lobe crackles auscultated Abdomen-soft, nontender, no organomegaly Extremities-no edema in the lower extremities Neuro-alert, oriented x3, no focal deficit noted  Status is: Inpatient:             Alyssa Ford S Alyssa Ford   [facility] If 7PM-7AM, please contact night-coverage at [email or web address], Office  [phone]   11/29/2022, 8:19 AM  LOS: 3 days

## 2022-11-30 DIAGNOSIS — N179 Acute kidney failure, unspecified: Secondary | ICD-10-CM | POA: Diagnosis not present

## 2022-11-30 DIAGNOSIS — R6521 Severe sepsis with septic shock: Secondary | ICD-10-CM | POA: Diagnosis not present

## 2022-11-30 DIAGNOSIS — J189 Pneumonia, unspecified organism: Secondary | ICD-10-CM | POA: Diagnosis not present

## 2022-11-30 DIAGNOSIS — A419 Sepsis, unspecified organism: Secondary | ICD-10-CM | POA: Diagnosis not present

## 2022-11-30 LAB — CBC
HCT: 32.6 % — ABNORMAL LOW (ref 36.0–46.0)
Hemoglobin: 11 g/dL — ABNORMAL LOW (ref 12.0–15.0)
MCH: 28.4 pg (ref 26.0–34.0)
MCHC: 33.7 g/dL (ref 30.0–36.0)
MCV: 84 fL (ref 80.0–100.0)
Platelets: 223 10*3/uL (ref 150–400)
RBC: 3.88 MIL/uL (ref 3.87–5.11)
RDW: 14.4 % (ref 11.5–15.5)
WBC: 6.6 10*3/uL (ref 4.0–10.5)
nRBC: 0 % (ref 0.0–0.2)

## 2022-11-30 LAB — BASIC METABOLIC PANEL
Anion gap: 10 (ref 5–15)
BUN: 46 mg/dL — ABNORMAL HIGH (ref 8–23)
CO2: 22 mmol/L (ref 22–32)
Calcium: 8 mg/dL — ABNORMAL LOW (ref 8.9–10.3)
Chloride: 105 mmol/L (ref 98–111)
Creatinine, Ser: 1.66 mg/dL — ABNORMAL HIGH (ref 0.44–1.00)
GFR, Estimated: 33 mL/min — ABNORMAL LOW (ref >60)
Glucose, Bld: 189 mg/dL — ABNORMAL HIGH (ref 70–99)
Potassium: 3.5 mmol/L (ref 3.5–5.1)
Sodium: 137 mmol/L (ref 135–145)

## 2022-11-30 LAB — GLUCOSE, CAPILLARY
Glucose-Capillary: 153 mg/dL — ABNORMAL HIGH (ref 70–99)
Glucose-Capillary: 152 mg/dL — ABNORMAL HIGH (ref 70–99)
Glucose-Capillary: 176 mg/dL — ABNORMAL HIGH (ref 70–99)
Glucose-Capillary: 163 mg/dL — ABNORMAL HIGH (ref 70–99)

## 2022-11-30 LAB — CULTURE, BLOOD (ROUTINE X 2)

## 2022-11-30 MED ORDER — OXYMETAZOLINE HCL 0.05 % NA SOLN: 1.0000 | Freq: Two times a day (BID) | NASAL | Status: DC | PRN

## 2022-11-30 NOTE — Progress Notes (Signed)
[facility]  PROGRESS NOTE  Brinkley Marquiss Mooneyham AVW:[id number] DOB: 07/02/1950 DOA: 11/26/2022 PCP: Donato Schultz, DO   Brief HPI:   72 -year-old [facility]-sided [facility].Patient said pain started on 5/31, also had temperature of 99 F at home.  Patient had sinus congestion and episode of diarrhea.  In the ER SpO2 was 88% and blood pressure was 67/41.  Chest x-ray showed left lower lobe pneumonia.  Patient started on IV antibiotics.  He was admitted to ICU.  Required pressors, she was weaned off pressors and transferred to medical floor.  [facility] assumed care on 11/29/2022.    Assessment/Plan:    Septic shock with lactic acidosis in setting of community-acquired pneumonia -Left lower lobe pneumonia -Blood cultures positive for Streptococcus pneumonia -Urine for Legionella negative, urine for strep pneumo antigen positive -Continue IV ceftriaxone for 7 days -Currently on Solu-Cortef 100 mg every 12 hours, being weaned off.  Stop after 5 days on 12/02/2022  Acute hypoxemic respiratory failure -In the setting of pneumonia -Initially required 2 L of oxygen by nasal cannula, weaned off to room air  Nonoliguric AKI from ATN secondary to septic shock -Urinary retention on presentation -Improving -Creatinine is down to 1.66  Hypomagnesemia -Replete  Hypokalemia -Replete  Diabetes mellitus type 2 -Continue sliding scale insulin NovoLog -CBG well-controlled  History of hypertension -Antihypertensive medications on hold  History of glaucoma -Continue xalatan  History of hyperlipidemia -Continue aspirin, fenofibrate, Crestor  History of GERD -Continue Protonix      Medications     aspirin EC  81 mg Oral Daily   azelastine  1 spray Each Nare BID   Chlorhexidine Gluconate Cloth  6 each Topical Daily   fenofibrate  160 mg Oral Daily   fluticasone  2 spray Each Nare Daily   heparin  5,000 Units Subcutaneous Q8H   hydrocortisone sod  succinate (SOLU-CORTEF) inj  100 mg Intravenous Q12H   insulin aspart  0-20 Units Subcutaneous TID WC   insulin aspart  0-5 Units Subcutaneous QHS   latanoprost  1 drop Both Eyes QHS   loratadine  10 mg Oral q1800   pantoprazole  20 mg Oral Daily   rosuvastatin  40 mg Oral QHS     Data Reviewed:   CBG:  Recent Labs  Lab 11/29/22 0750 11/29/22 1155 11/29/22 1627 11/29/22 2113 11/30/22 0738  GLUCAP 149* 182* 153* 184* 153*    SpO2: 96 % O2 Flow Rate (L/min): 2 L/min    Vitals:   11/29/22 1223 11/29/22 2110 11/30/22 0441 11/30/22 0500  BP: (!) 167/79 (!) 184/89 96/83   Pulse: 87 88 83   Resp: 19 19 19    Temp: 97.7 F (36.5 C) 97.6 F (36.4 C) 97.7 F (36.5 C)   TempSrc: Oral Oral Oral   SpO2: 95% 95% 96%   Weight:    67.8 kg  Height:          Data Reviewed:  Basic Metabolic Panel: Recent Labs  Lab 11/27/22 0320 11/27/22 0749 11/28/22 0309 11/29/22 0413 11/30/22 0421  NA 132* 131* 128* 139 137  K 4.2 3.8 3.5 2.8* 3.5  CL 96* 95* 93* 103 105  CO2 20* 19* 17* 20* 22  GLUCOSE 84 72 112* 158* 189*  BUN 59* 60* 62* 60* 46*  CREATININE 3.09* 3.26* 3.03* 2.39* 1.66*  CALCIUM 6.4* 7.0* 6.9* 7.7* 8.0*  MG 0.6* 1.2*  --  1.8  --   PHOS 8.7*  --   --   --   --  CBC: Recent Labs  Lab 11/26/22 1807 11/27/22 0320 11/28/22 0309 11/29/22 0413 11/30/22 0421  WBC 7.5 11.6* 12.0* 10.7* 6.6  HGB 12.2 11.5* 11.3* 11.4* 11.0*  HCT 38.7 35.5* 34.4* 33.5* 32.6*  MCV 91.7 88.3 87.1 83.1 84.0  PLT 207 234 197 221 223    LFT Recent Labs  Lab 11/26/22 1254 11/27/22 0749  AST 20 20  ALT 19 19  ALKPHOS 39 43  BILITOT 0.5 0.6  PROT 7.5 6.1*  ALBUMIN 4.1 2.7*     Antibiotics: Anti-infectives (From admission, onward)    Start     Dose/Rate Route Frequency Ordered Stop   11/27/22 1600  azithromycin (ZITHROMAX) 500 mg in sodium chloride 0.9 % 250 mL IVPB  Status:  Discontinued        500 mg 250 mL/hr over 60 Minutes Intravenous Every 24 hours 11/26/22  1758 11/27/22 1207   11/27/22 1400  cefTRIAXone (ROCEPHIN) 2 g in sodium chloride 0.9 % 100 mL IVPB        2 g 200 mL/hr over 30 Minutes Intravenous Every 24 hours 11/26/22 1758     11/26/22 1530  azithromycin (ZITHROMAX) 500 mg in sodium chloride 0.9 % 250 mL IVPB        500 mg 250 mL/hr over 60 Minutes Intravenous  Once 11/26/22 1515 11/26/22 1612   11/26/22 1315  cefTRIAXone (ROCEPHIN) 2 g in sodium chloride 0.9 % 100 mL IVPB        2 g 200 mL/hr over 30 Minutes Intravenous  Once 11/26/22 1312 11/26/22 1405   11/26/22 1315  metroNIDAZOLE (FLAGYL) IVPB 500 mg        500 mg 100 mL/hr over 60 Minutes Intravenous  Once 11/26/22 1312 11/26/22 1440        DVT prophylaxis: Heparin  Code Status: Full code  Family Communication: Discussed with patient's daughter at bedside   CONSULTS PCCM   Subjective   Denies chest pain or shortness of breath   Objective    Physical Examination:  General-appears in no acute distress Heart-S1-S2, regular, no murmur auscultated Lungs-crackles auscultated at left lung base Abdomen-soft, nontender, no organomegaly Extremities-no edema in the lower extremities Neuro-alert, oriented x3, no focal deficit noted  Status is: Inpatient:             [name]   [facility] If 7PM-7AM, please contact night-coverage at [email or web address], Office  [phone]   11/30/2022, 8:20 AM  LOS: 4 days

## 2022-11-30 NOTE — Care Management Important Message (Signed)
Important Message  Patient Details IM Letter given. Name: Alyssa Ford MRN: [id number] Date of Birth: [birth year]   Medicare Important Message Given:  Yes     Caren Macadam 11/30/2022, 11:09 AM

## 2022-12-01 DIAGNOSIS — A419 Sepsis, unspecified organism: Secondary | ICD-10-CM | POA: Diagnosis not present

## 2022-12-01 DIAGNOSIS — J189 Pneumonia, unspecified organism: Secondary | ICD-10-CM | POA: Diagnosis not present

## 2022-12-01 DIAGNOSIS — N179 Acute kidney failure, unspecified: Secondary | ICD-10-CM | POA: Diagnosis not present

## 2022-12-01 DIAGNOSIS — R6521 Severe sepsis with septic shock: Secondary | ICD-10-CM | POA: Diagnosis not present

## 2022-12-01 LAB — COMPREHENSIVE METABOLIC PANEL
ALT: 21 U/L (ref 0–44)
AST: 21 U/L (ref 15–41)
Albumin: 3 g/dL — ABNORMAL LOW (ref 3.5–5.0)
Alkaline Phosphatase: 58 U/L (ref 38–126)
Anion gap: 12 (ref 5–15)
BUN: 40 mg/dL — ABNORMAL HIGH (ref 8–23)
CO2: 27 mmol/L (ref 22–32)
Calcium: 8.8 mg/dL — ABNORMAL LOW (ref 8.9–10.3)
Chloride: 102 mmol/L (ref 98–111)
Creatinine, Ser: 1.14 mg/dL — ABNORMAL HIGH (ref 0.44–1.00)
GFR, Estimated: 51 mL/min — ABNORMAL LOW (ref >60)
Glucose, Bld: 171 mg/dL — ABNORMAL HIGH (ref 70–99)
Potassium: 3.1 mmol/L — ABNORMAL LOW (ref 3.5–5.1)
Sodium: 141 mmol/L (ref 135–145)
Total Bilirubin: 0.3 mg/dL (ref 0.3–1.2)
Total Protein: 6.9 g/dL (ref 6.5–8.1)

## 2022-12-01 LAB — CULTURE, BLOOD (ROUTINE X 2): Special Requests: ADEQUATE

## 2022-12-01 LAB — GLUCOSE, CAPILLARY
Glucose-Capillary: 136 mg/dL — ABNORMAL HIGH (ref 70–99)
Glucose-Capillary: 120 mg/dL — ABNORMAL HIGH (ref 70–99)
Glucose-Capillary: 171 mg/dL — ABNORMAL HIGH (ref 70–99)

## 2022-12-01 LAB — POTASSIUM: Potassium: 3.6 mmol/L (ref 3.5–5.1)

## 2022-12-01 MED ORDER — POTASSIUM CHLORIDE 20 MEQ PO PACK
60.0000 meq | PACK | Freq: Once | ORAL | Status: AC
  Administered 2022-12-01: 60 meq via ORAL
  Filled 2022-12-01: qty 3

## 2022-12-01 MED ORDER — AMOXICILLIN-POT CLAVULANATE 875-125 MG PO TABS: 1.0000 | ORAL_TABLET | Freq: Two times a day (BID) | ORAL | 0 refills | Status: AC

## 2022-12-01 MED ORDER — HYDRALAZINE HCL 25 MG PO TABS
25.0000 mg | ORAL_TABLET | Freq: Four times a day (QID) | ORAL | Status: DC | PRN
  Administered 2022-12-01: 25 mg via ORAL
  Filled 2022-12-01: qty 1

## 2022-12-01 NOTE — Progress Notes (Signed)
   12/01/22 1504  Vitals  BP (!) 180/69  MAP (mmHg) 97  BP Method Automatic  Pulse Rate 77  Pulse Rate Source Monitor  MEWS COLOR  MEWS Score Color Green  Oxygen Therapy  SpO2 98 %  MEWS Score  MEWS Temp 0  MEWS Systolic 0  MEWS Pulse 0  MEWS RR 0  MEWS LOC 0  MEWS Score 0   Secure chat sent to Dr. Drusilla Kanner, informed of Bps above with coinciding MAPs. Attempt to confirm MD is still ok with discharge plan with noted BPs

## 2022-12-01 NOTE — Progress Notes (Signed)
Discharge instructions given to pt. Verbalized understanding. Pt was transferred via w/c for transportation home. No s/s of distress.

## 2022-12-01 NOTE — Discharge Summary (Addendum)
Physician Discharge Summary   Patient: Alyssa Ford MRN: [id number] DOB: 1951/04/30  Admit date:     11/26/2022  Discharge date: 12/01/22  Discharge Physician: [name]   PCP: Donato Schultz, DO   Recommendations at discharge:   Follow-up PCP in 1 week  Discharge Diagnoses: Principal Problem:   Community acquired pneumonia of left lower lobe of lung Active Problems:   Septic shock (HCC)  Resolved Problems:   * No resolved hospital problems. [facility] Course:  72 [facility].Patient said pain started on 5/31, also had temperature of 99 F at home.  Patient had sinus congestion and episode of diarrhea.  In the ER SpO2 was 88% and blood pressure was 67/41.  Chest x-ray showed left lower lobe pneumonia.  Patient started on IV antibiotics.  He was admitted to ICU.  Required pressors, she was weaned off pressors and transferred to medical floor.  [facility] assumed care on 11/29/2022.    Assessment and Plan:  Septic shock with lactic acidosis in setting of community-acquired pneumonia -Left lower lobe pneumonia -Resolved, sepsis physiology has resolved, shock has resolved -Blood cultures positive for Streptococcus pneumonia -Urine for Legionella negative, urine for strep pneumo antigen positive -Received 6 days of IV ceftriaxone -5 days of Solu-Cortef 100 mg IV every 12 hours -Will discharge on Augmentin 1 tablet p.o. twice daily for 4 more days to complete 10 days of treatment.  Acute hypoxemic respiratory failure -In the setting of pneumonia -Initially required 2 L of oxygen by nasal cannula, weaned off to room air   Nonoliguric AKI from ATN secondary to septic shock -Urinary retention on presentation -Improving -Creatinine is down to 1.14   Hypomagnesemia -Replete   Hypokalemia -Replete, potassium  this afternoon is 3.6   Diabetes mellitus type 2 -Continue home regimen   History of  hypertension -Continue lisinopril 5 mg daily  History of glaucoma -Continue xalatan   History of hyperlipidemia -Continue aspirin, fenofibrate, Crestor         Consultants:  Procedures performed:  Disposition: Home Diet recommendation:  Discharge Diet Orders (From admission, onward)     Start     Ordered   12/01/22 0000  Diet - low sodium heart healthy        12/01/22 1454           Carb modified diet DISCHARGE MEDICATION: Allergies as of 12/01/2022       Reactions   Codeine Nausea Only   Severe nausea        Medication List     TAKE these medications    ALEVE PO Take 2 tablets by mouth daily as needed (pain).   amitriptyline 25 MG tablet Commonly known as: ELAVIL Take 2 tablets (50 mg total) by mouth daily at 12 noon. What changed: when to take this   amoxicillin-clavulanate 875-125 MG tablet Commonly known as: AUGMENTIN Take 1 tablet by mouth 2 (two) times daily for 4 days.   aspirin EC 81 MG tablet Take 81 mg by mouth daily.   azelastine 0.1 % nasal spray Commonly known as: ASTELIN Place 1 spray into both nostrils 2 (two) times daily. Use in each nostril as directed What changed:  when to take this reasons to take this   BIOTIN PO Take 1 tablet by mouth daily.   CO Q 10 PO Take 1 capsule by mouth daily.   fenofibrate 160 MG tablet Take 1 tablet (160 mg  total) by mouth daily.   fluticasone 50 MCG/ACT nasal spray Commonly known as: FLONASE Place 2 sprays into both nostrils daily. What changed:  when to take this reasons to take this   ipratropium 0.06 % nasal spray Commonly known as: ATROVENT Place 2 sprays into both nostrils 3 (three) times daily as needed for rhinitis.   IRON SUPPLEMENT PO Take 1 tablet by mouth every other day. 65mg    latanoprost 0.005 % ophthalmic solution Commonly known as: XALATAN Place 1 drop into both eyes at bedtime.   levocetirizine 5 MG tablet Commonly known as: XYZAL Take 1 tablet (5 mg total)  by mouth every evening.   lisinopril 5 MG tablet Commonly known as: ZESTRIL TAKE 1 TABLET(5 MG) BY MOUTH DAILY   metFORMIN 500 MG 24 hr tablet Commonly known as: GLUCOPHAGE-XR Take 4 pills every day   NONFORMULARY OR COMPOUNDED ITEM Cbd /delta 9 1 po qhs What changed:  how much to take how to take this when to take this   omeprazole 20 MG capsule Commonly known as: PRILOSEC Take 1 capsule (20 mg total) by mouth daily.   repaglinide 1 MG tablet Commonly known as: PRANDIN Take 2 tablets (2 mg total) by mouth daily. Before evening meal.   rosuvastatin 40 MG tablet Commonly known as: CRESTOR Take 1 tablet (40 mg total) by mouth at bedtime.   Rybelsus 3 MG Tabs Generic drug: Semaglutide 1 tablet daily What changed:  how much to take how to take this when to take this additional instructions        Discharge Exam: Filed Weights   11/29/22 0500 11/30/22 0500 12/01/22 0445  Weight: 70.9 kg 67.8 kg 65.6 kg   General-appears in no acute distress Heart-S1-S2, regular, no murmur auscultated Lungs-clear to auscultation bilaterally, no wheezing or crackles auscultated Abdomen-soft, nontender, no organomegaly Extremities-no edema in the lower extremities Neuro-alert, oriented x3, no focal deficit noted  Condition at discharge: good  The results of significant diagnostics from this hospitalization (including imaging, microbiology, ancillary and laboratory) are listed below for reference.   Imaging Studies: US RENAL  Result Date: 11/28/2022 CLINICAL DATA:  Acute renal injury EXAM: RENAL / URINARY TRACT ULTRASOUND COMPLETE COMPARISON:  Renal stone CT 04/30/2021 FINDINGS: Right Kidney: Renal measurements: 10.9 x 4.5 x 5.6 cm = volume: 143.7 mL. Echogenicity within normal limits. No mass or hydronephrosis visualized. Left Kidney: Renal measurements: 11.8 x 5.9 x 6.0 cm = volume: 217.9 mL. Echogenicity within normal limits. No mass or hydronephrosis visualized. Bladder: Appears  normal for degree of bladder distention. Other: None. IMPRESSION: No hydronephrosis. Electronically Signed   By: Annia Belt M.D.   On: 11/28/2022 17:49   DG CHEST PORT 1 VIEW  Result Date: 11/28/2022 CLINICAL DATA:  Pneumonia EXAM: PORTABLE CHEST 1 VIEW COMPARISON:  Chest radiograph dated 11/26/2022 FINDINGS: Normal lung volumes. Left basilar confluent opacity. Increased left mid lung hazy opacities. No pneumothorax. The heart size and mediastinal contours are within normal limits. Cervical spinal fixation hardware appears intact. Right upper quadrant surgical clips. IMPRESSION: 1. Left basilar confluent opacity in keeping with known pneumonia. 2. Increased left mid lung hazy opacities, may represent layering pleural effusion. Electronically Signed   By: Agustin Cree M.D.   On: 11/28/2022 08:28   CT ABDOMEN PELVIS WO CONTRAST  Result Date: 11/26/2022 CLINICAL DATA:  Left flank pain and left lower quadrant pain for 2 days. Nausea. Diarrhea. EXAM: CT ABDOMEN AND PELVIS WITHOUT CONTRAST TECHNIQUE: Multidetector CT imaging of the abdomen and pelvis  was performed following the standard protocol without IV contrast. RADIATION DOSE REDUCTION: This exam was performed according to the departmental dose-optimization program which includes automated exposure control, adjustment of the mA and/or kV according to patient size and/or use of iterative reconstruction technique. COMPARISON:  04/30/2021 FINDINGS: Lower chest: Airspace consolidation is seen in the left lower lobe, suspicious for pneumonia. No evidence of pleural fluid. Hepatobiliary: No mass visualized on this unenhanced exam. Mild diffuse hepatic steatosis noted. Prior cholecystectomy. No evidence of biliary obstruction. Pancreas: No mass or inflammatory process visualized on this unenhanced exam. Spleen:  Within normal limits in size. Adrenals/Urinary tract: Right renal vascular calcification is again seen, without definite evidence of nephrolithiasis. No  evidence of ureteral calculi or hydronephrosis. Unremarkable unopacified urinary bladder. Stomach/Bowel: No evidence of obstruction, inflammatory process, or abnormal fluid collections. Normal appendix visualized. Vascular/Lymphatic: No pathologically enlarged lymph nodes identified. No evidence of abdominal aortic aneurysm. Aortic atherosclerotic calcification incidentally noted. Reproductive:  No mass or other significant abnormality. Other:  None. Musculoskeletal:  No suspicious bone lesions identified. IMPRESSION: No acute findings within the abdomen or pelvis. Mild hepatic steatosis. Airspace consolidation in left lower lobe, highly suspicious for pneumonia. Aortic Atherosclerosis (ICD10-I70.0). Electronically Signed   By: Danae Orleans M.D.   On: 11/26/2022 15:46   DG Chest Port 1 View  Result Date: 11/26/2022 CLINICAL DATA:  Questionable sepsis.  Pain. EXAM: PORTABLE CHEST 1 VIEW COMPARISON:  August 04, 2020 FINDINGS: The cardiomediastinal silhouette is stable. The right lung is clear. Mild opacity in the left lateral lung base. No other interval changes or acute abnormalities. IMPRESSION: Mild opacity in the left lateral lung base could represent atelectasis or early infiltrate. Recommend short-term follow-up imaging to ensure resolution. Electronically Signed   By: Gerome Sam III M.D.   On: 11/26/2022 13:34   MM 3D SCREEN BREAST BILATERAL  Result Date: 11/14/2022 CLINICAL DATA:  Screening. EXAM: DIGITAL SCREENING BILATERAL MAMMOGRAM WITH TOMOSYNTHESIS AND CAD TECHNIQUE: Bilateral screening digital craniocaudal and mediolateral oblique mammograms were obtained. Bilateral screening digital breast tomosynthesis was performed. The images were evaluated with computer-aided detection. COMPARISON:  Previous exam(s). ACR Breast Density Category b: There are scattered areas of fibroglandular density. FINDINGS: There are no findings suspicious for malignancy. IMPRESSION: No mammographic evidence of  malignancy. A result letter of this screening mammogram will be mailed directly to the patient. RECOMMENDATION: Screening mammogram in one year. (Code:[id number]-[id number]) BI-RADS CATEGORY  1: Negative. Electronically Signed   By: Norva Pavlov M.D.   On: 11/14/2022 13:35    Microbiology: Results for orders placed or performed during the hospital encounter of 11/26/22  Blood Culture (routine x 2)     Status: Abnormal   Collection Time: 11/26/22 12:56 PM   Specimen: BLOOD RIGHT ARM  Result Value Ref Range Status   Specimen Description   Final    BLOOD RIGHT ARM Performed at [facility], [address], [place], [place] [address]    Special Requests   Final    BOTTLES DRAWN AEROBIC AND ANAEROBIC BCAV Performed at [facility], [address] [place], [place] [address]    Culture  Setup Time   Final    GRAM POSITIVE COCCI IN BOTH AEROBIC AND ANAEROBIC BOTTLES CRITICAL RESULT CALLED TO, READ BACK BY AND VERIFIED WITH: PHARMD M. Doran Durand 11/27/22 @ 0448 BY AB Performed at [facility], [address] [place], [place] [address]    Culture STREPTOCOCCUS PNEUMONIAE (A)  Final   Report Status 11/29/2022 FINAL  Final   Organism ID, Bacteria STREPTOCOCCUS PNEUMONIAE  Final      Susceptibility   Streptococcus pneumoniae - MIC*    ERYTHROMYCIN 4 RESISTANT Resistant     LEVOFLOXACIN 0.5 SENSITIVE Sensitive     VANCOMYCIN 0.5 SENSITIVE Sensitive     PENICILLIN (meningitis) <=0.06 SENSITIVE Sensitive     PENO - penicillin <=0.06      PENICILLIN (non-meningitis) <=0.06 SENSITIVE Sensitive     PENICILLIN (oral) <=0.06 SENSITIVE Sensitive     CEFTRIAXONE (non-meningitis) <=0.12 SENSITIVE Sensitive     CEFTRIAXONE (meningitis) <=0.12 SENSITIVE Sensitive     * STREPTOCOCCUS PNEUMONIAE  Blood Culture ID Panel (Reflexed)     Status: Abnormal   Collection Time: 11/26/22 12:56 PM  Result Value Ref Range Status   Enterococcus faecalis NOT DETECTED NOT DETECTED Final    Enterococcus Faecium NOT DETECTED NOT DETECTED Final   Listeria monocytogenes NOT DETECTED NOT DETECTED Final   Staphylococcus species NOT DETECTED NOT DETECTED Final   Staphylococcus aureus (BCID) NOT DETECTED NOT DETECTED Final   Staphylococcus epidermidis NOT DETECTED NOT DETECTED Final   Staphylococcus lugdunensis NOT DETECTED NOT DETECTED Final   Streptococcus species DETECTED (A) NOT DETECTED Final    Comment: CRITICAL RESULT CALLED TO, READ BACK BY AND VERIFIED WITH: PHARMD M. LILLISTON 11/27/22 @ 0448 BY AB    Streptococcus agalactiae NOT DETECTED NOT DETECTED Final   Streptococcus pneumoniae DETECTED (A) NOT DETECTED Final    Comment: CRITICAL RESULT CALLED TO, READ BACK BY AND VERIFIED WITH: PHARMD M. LILLISTON 11/27/22 @ 0448 BY AB    Streptococcus pyogenes NOT DETECTED NOT DETECTED Final   A.calcoaceticus-baumannii NOT DETECTED NOT DETECTED Final   Bacteroides fragilis NOT DETECTED NOT DETECTED Final   Enterobacterales NOT DETECTED NOT DETECTED Final   Enterobacter cloacae complex NOT DETECTED NOT DETECTED Final   Escherichia coli NOT DETECTED NOT DETECTED Final   Klebsiella aerogenes NOT DETECTED NOT DETECTED Final   Klebsiella oxytoca NOT DETECTED NOT DETECTED Final   Klebsiella pneumoniae NOT DETECTED NOT DETECTED Final   Proteus species NOT DETECTED NOT DETECTED Final   Salmonella species NOT DETECTED NOT DETECTED Final   Serratia marcescens NOT DETECTED NOT DETECTED Final   Haemophilus influenzae NOT DETECTED NOT DETECTED Final   Neisseria meningitidis NOT DETECTED NOT DETECTED Final   Pseudomonas aeruginosa NOT DETECTED NOT DETECTED Final   Stenotrophomonas maltophilia NOT DETECTED NOT DETECTED Final   Candida albicans NOT DETECTED NOT DETECTED Final   Candida auris NOT DETECTED NOT DETECTED Final   Candida glabrata NOT DETECTED NOT DETECTED Final   Candida krusei NOT DETECTED NOT DETECTED Final   Candida parapsilosis NOT DETECTED NOT DETECTED Final   Candida  tropicalis NOT DETECTED NOT DETECTED Final   Cryptococcus neoformans/gattii NOT DETECTED NOT DETECTED Final    Comment: Performed at [facility], [address], [place], [place] [address]  Blood Culture (routine x 2)     Status: Abnormal   Collection Time: 11/26/22  1:30 PM   Specimen: BLOOD RIGHT ARM  Result Value Ref Range Status   Specimen Description BLOOD RIGHT ARM  Final   Special Requests   Final    BOTTLES DRAWN AEROBIC AND ANAEROBIC Blood Culture adequate volume   Culture  Setup Time   Final    GRAM POSITIVE COCCI IN BOTH AEROBIC AND ANAEROBIC BOTTLES CRITICAL VALUE NOTED.  VALUE IS CONSISTENT WITH PREVIOUSLY REPORTED AND CALLED VALUE.    Culture (A)  Final    STREPTOCOCCUS PNEUMONIAE SUSCEPTIBILITIES  PERFORMED ON PREVIOUS CULTURE WITHIN THE LAST 5 DAYS. Performed at [facility], [address] [place], [place] [address]    Report Status 11/29/2022 FINAL  Final  Resp panel by RT-PCR (RSV, Flu A&B, Covid) Anterior Nasal Swab     Status: None   Collection Time: 11/26/22  1:44 PM   Specimen: Anterior Nasal Swab  Result Value Ref Range Status   SARS Coronavirus 2 by RT PCR NEGATIVE NEGATIVE Final    Comment: (NOTE) SARS-CoV-2 target nucleic acids are NOT DETECTED.  The SARS-CoV-2 RNA is generally detectable in upper respiratory specimens during the acute phase of infection. The lowest concentration of SARS-CoV-2 viral copies this assay can detect is 138 copies/mL. A negative result does not preclude SARS-Cov-2 infection and should not be used as the sole basis for treatment or other patient management decisions. A negative result may occur with  improper specimen collection/handling, submission of specimen other than nasopharyngeal swab, presence of viral mutation(s) within the areas targeted by this assay, and inadequate number of viral copies(<138 copies/mL). A negative result must be combined with clinical observations, patient history, and  epidemiological information. The expected result is Negative.  Fact Sheet for Patients:  BloggerCourse.com  Fact Sheet for Healthcare Providers:  SeriousBroker.it  This test is no t yet approved or cleared by the [place] FDA and  has been authorized for detection and/or diagnosis of SARS-CoV-2 by FDA under an Emergency Use Authorization (EUA). This EUA will remain  in effect (meaning this test can be used) for the duration of the COVID-19 declaration under Section 564(b)(1) of the Act, 21 U.S.C.section 360bbb-3(b)(1), unless the authorization is terminated  or revoked sooner.       Influenza A by PCR NEGATIVE NEGATIVE Final   Influenza B by PCR NEGATIVE NEGATIVE Final    Comment: (NOTE) The Xpert Xpress SARS-CoV-2/FLU/RSV plus assay is intended as an aid in the diagnosis of influenza from Nasopharyngeal swab specimens and should not be used as a sole basis for treatment. Nasal washings and aspirates are unacceptable for Xpert Xpress SARS-CoV-2/FLU/RSV testing.  Fact Sheet for Patients: BloggerCourse.com  Fact Sheet for Healthcare Providers: SeriousBroker.it  This test is not yet approved or cleared by the [place] FDA and has been authorized for detection and/or diagnosis of SARS-CoV-2 by FDA under an Emergency Use Authorization (EUA). This EUA will remain in effect (meaning this test can be used) for the duration of the COVID-19 declaration under Section 564(b)(1) of the Act, 21 U.S.C. section 360bbb-3(b)(1), unless the authorization is terminated or revoked.     Resp Syncytial Virus by PCR NEGATIVE NEGATIVE Final    Comment: (NOTE) Fact Sheet for Patients: BloggerCourse.com  Fact Sheet for Healthcare Providers: SeriousBroker.it  This test is not yet approved or cleared by the [place] FDA and has been  authorized for detection and/or diagnosis of SARS-CoV-2 by FDA under an Emergency Use Authorization (EUA). This EUA will remain in effect (meaning this test can be used) for the duration of the COVID-19 declaration under Section 564(b)(1) of the Act, 21 U.S.C. section 360bbb-3(b)(1), unless the authorization is terminated or revoked.  Performed at [facility], [address], [place], [place] [address]   MRSA Next Gen by PCR, Nasal     Status: None   Collection Time: 11/26/22  5:42 PM   Specimen: Nasal Mucosa; Nasal Swab  Result Value Ref Range Status   MRSA by PCR Next Gen NOT DETECTED NOT DETECTED Final  Comment: (NOTE) The GeneXpert MRSA Assay (FDA approved for NASAL specimens only), is one component of a comprehensive MRSA colonization surveillance program. It is not intended to diagnose MRSA infection nor to guide or monitor treatment for MRSA infections. Test performance is not FDA approved in patients less than 33 years old. Performed at [facility], [address] [place], [place] [address]   Culture, blood (Routine X 2) w Reflex to ID Panel     Status: None (Preliminary result)   Collection Time: 11/27/22  4:32 PM   Specimen: BLOOD  Result Value Ref Range Status   Specimen Description   Final    BLOOD BLOOD RIGHT ARM Performed at [facility], [address] [place], [place] [address]    Special Requests   Final    BOTTLES DRAWN AEROBIC AND ANAEROBIC Blood Culture adequate volume Performed at [facility], [address], [place], [place] [address]    Culture   Final    NO GROWTH 4 DAYS Performed at [facility], [address], [place], [place] [address]    Report Status PENDING  Incomplete  Culture, blood (Routine X 2) w Reflex to ID Panel     Status: None (Preliminary result)   Collection Time: 11/27/22  4:45 PM   Specimen: BLOOD  Result Value Ref Range Status   Specimen  Description   Final    BLOOD BLOOD RIGHT HAND Performed at [facility], [address] [place], [place] [address]    Special Requests   Final    BOTTLES DRAWN AEROBIC AND ANAEROBIC Blood Culture adequate volume Performed at [facility], [address], [place], [place] [address]    Culture   Final    NO GROWTH 4 DAYS Performed at [facility], [address], [place], [place] [address]    Report Status PENDING  Incomplete    Labs: CBC: Recent Labs  Lab 11/26/22 1807 11/27/22 0320 11/28/22 0309 11/29/22 0413 11/30/22 0421  WBC 7.5 11.6* 12.0* 10.7* 6.6  HGB 12.2 11.5* 11.3* 11.4* 11.0*  HCT 38.7 35.5* 34.4* 33.5* 32.6*  MCV 91.7 88.3 87.1 83.1 84.0  PLT 207 234 197 221 223   Basic Metabolic Panel: Recent Labs  Lab 11/27/22 0320 11/27/22 0749 11/28/22 0309 11/29/22 0413 11/30/22 0421 12/01/22 0442 12/01/22 1246  NA 132* 131* 128* 139 137 141  --   K 4.2 3.8 3.5 2.8* 3.5 3.1* 3.6  CL 96* 95* 93* 103 105 102  --   CO2 20* 19* 17* 20* 22 27  --   GLUCOSE 84 72 112* 158* 189* 171*  --   BUN 59* 60* 62* 60* 46* 40*  --   CREATININE 3.09* 3.26* 3.03* 2.39* 1.66* 1.14*  --   CALCIUM 6.4* 7.0* 6.9* 7.7* 8.0* 8.8*  --   MG 0.6* 1.2*  --  1.8  --   --   --   PHOS 8.7*  --   --   --   --   --   --    Liver Function Tests: Recent Labs  Lab 11/26/22 1254 11/27/22 0749 12/01/22 0442  AST 20 20 21   ALT 19 19 21   ALKPHOS 39 43 58  BILITOT 0.5 0.6 0.3  PROT 7.5 6.1* 6.9  ALBUMIN 4.1 2.7* 3.0*   CBG: Recent Labs  Lab 11/30/22 1123 11/30/22 1633 11/30/22 2226 12/01/22 0755 12/01/22 1207  GLUCAP 152* 176* 163* 136*  120*    Discharge time spent: greater than 30 minutes.  Signed: [name], MD [facility] 12/01/2022

## 2022-12-02 LAB — CULTURE, BLOOD (ROUTINE X 2)

## 2022-12-04 ENCOUNTER — Ambulatory Visit: Payer: Self-pay

## 2022-12-04 ENCOUNTER — Telehealth: Payer: Self-pay

## 2022-12-04 NOTE — Transitions of Care (Post Inpatient/ED Visit) (Signed)
12/04/2022  Name: Alyssa Ford MRN: [id number] DOB: 11-Jul-1950  Today's TOC FU Call Status: Today's TOC FU Call Status:: Successful TOC FU Call Competed TOC FU Call Complete Date: 12/04/22 (Incoming call from pt returning RN CM call.)  Transition Care Management Follow-up Telephone Call Date of Discharge: 12/01/22 Discharge Facility: [facility]) Type of Discharge: Inpatient Admission Primary Inpatient Discharge Diagnosis:: "sepsis w/ acute renal failure & septic shock due to unspecified organism" How have you been since you were released from the hospital?: Better (Pt voices overall she is doing better. Denies any SOB. She feels a "lttle tired and still has a dry cough at times with some mild pain to left side "when she coughs.) Any questions or concerns?: No  Items Reviewed: Did you receive and understand the discharge instructions provided?: Yes Medications obtained,verified, and reconciled?: Yes (Medications Reviewed) Any new allergies since your discharge?: No Dietary orders reviewed?: Yes Type of Diet Ordered:: low salt/heart healthy/carb modified Do you have support at home?: Yes People in Home: spouse Name of Support/Comfort Primary Source: Alyssa Ford  Medications Reviewed Today: Medications Reviewed Today     Reviewed by Charlyn Minerva, RN (Registered Nurse) on 12/04/22 at 1419  Med List Status: <None>   Medication Order Taking? Sig Documenting Provider Last Dose Status Informant  amitriptyline (ELAVIL) 25 MG tablet [id number] Yes Take 2 tablets (50 mg total) by mouth daily at 12 noon.  Patient taking differently: Take 50 mg by mouth at bedtime.   Donato Schultz, DO Taking Active Self           Med Note [name], [place]   Sun Nov 26, 2022 10:10 PM)    amoxicillin-clavulanate (AUGMENTIN) 875-125 MG tablet [id number] Yes Take 1 tablet by mouth 2 (two) times daily for 4 days. [name], MD Taking Active   aspirin EC 81 MG tablet [id number]  Yes Take 81 mg by mouth daily. Donato Schultz, DO Taking Active Self  azelastine (ASTELIN) 0.1 % nasal spray [id number] Yes Place 1 spray into both nostrils 2 (two) times daily. Use in each nostril as directed  Patient taking differently: Place 1 spray into both nostrils 2 (two) times daily as needed for allergies. Use in each nostril as directed   Donato Schultz, DO Taking Active Self  BIOTIN PO [id number] Yes Take 1 tablet by mouth daily.  [provider] Taking Active Self  Coenzyme Q10 (CO Q 10 PO) [id number] Yes Take 1 capsule by mouth daily. [provider] Taking Active Self  fenofibrate 160 MG tablet [id number] Yes Take 1 tablet (160 mg total) by mouth daily. Donato Schultz, DO Taking Active Self  Ferrous Sulfate (IRON SUPPLEMENT PO) [id number] Yes Take 1 tablet by mouth every other day. 65mg  [provider] Taking Active Self           Med Note [name], Merlyn Lot Nov 26, 2022 10:12 PM)    fluticasone (FLONASE) 50 MCG/ACT nasal spray [id number] Yes Place 2 sprays into both nostrils daily.  Patient taking differently: Place 2 sprays into both nostrils daily as needed for allergies.   [name], MD Taking Active Self  ipratropium (ATROVENT) 0.06 % nasal spray [id number] Yes Place 2 sprays into both nostrils 3 (three) times daily as needed for rhinitis. [name], MD Taking Active Self  latanoprost (XALATAN) 0.005 % ophthalmic solution [id number] Yes Place 1 drop into both eyes at bedtime.  [provider] Taking Active Self           Med Note Michaelle Copas   Thu Aug 19, 2020  3:42 PM)    levocetirizine (XYZAL) 5 MG tablet [id number] Yes Take 1 tablet (5 mg total) by mouth every evening. [name], MD Taking Active Self  lisinopril (ZESTRIL) 5 MG tablet [id number] Yes TAKE 1 TABLET(5 MG) BY MOUTH DAILY [name], [place], DO Taking Active Self  metFORMIN (GLUCOPHAGE-XR) 500 MG 24 hr tablet [id number] Yes Take 4 pills  every day [name], MD Taking Active Self  Naproxen Sodium (ALEVE PO) [id number] Yes Take 2 tablets by mouth daily as needed (pain). [provider] Taking Active Self           Med Note [name], Merlyn Lot Nov 26, 2022 10:13 PM)    NONFORMULARY OR COMPOUNDED ITEM [id number] Yes Cbd /delta 9 1 po qhs  Patient taking differently: Take 1 tablet by mouth at bedtime. Cbd /delta 9 1 po qhs   [name], DO Taking Active Self  omeprazole (PRILOSEC) 20 MG capsule [id number] Yes Take 1 capsule (20 mg total) by mouth daily. Donato Schultz, DO Taking Active Self  repaglinide (PRANDIN) 1 MG tablet [id number] Yes Take 2 tablets (2 mg total) by mouth daily. Before evening meal. [name], MD Taking Active Self  rosuvastatin (CRESTOR) 40 MG tablet [id number] Yes Take 1 tablet (40 mg total) by mouth at bedtime. [name], DO Taking Active Self  Semaglutide (RYBELSUS) 3 MG TABS [id number] Yes 1 tablet daily  Patient taking differently: Take 1 tablet by mouth daily.   [name], MD Taking Active Self            Home Care and Equipment/Supplies: Were Home Health Services Ordered?: NA Any new equipment or medical supplies ordered?: NA  Functional Questionnaire: Do you need assistance with bathing/showering or dressing?: No Do you need assistance with meal preparation?: No Do you need assistance with eating?: No Do you have difficulty maintaining continence: No Do you need assistance with getting out of bed/getting out of a chair/moving?: No Do you have difficulty managing or taking your medications?: No  Follow up appointments reviewed: PCP Follow-up appointment confirmed?: Yes Date of PCP follow-up appointment?: 12/14/22 Follow-up Provider: Dr. Seabron Spates Specialist [facility] Follow-up appointment confirmed?: NA Do you need transportation to your follow-up appointment?: No Do you understand care options if your condition(s) worsen?:  Yes-patient verbalized understanding  SDOH Interventions Today    Flowsheet Row Most Recent Value  SDOH Interventions   Food Insecurity Interventions Intervention Not Indicated  Transportation Interventions Intervention Not Indicated      TOC Interventions Today    Flowsheet Row Most Recent Value  TOC Interventions   TOC Interventions Discussed/Reviewed TOC Interventions Discussed, S/S of infection      Interventions Today    Flowsheet Row Most Recent Value  General Interventions   General Interventions Discussed/Reviewed General Interventions Discussed, Doctor Visits, Durable Medical Equipment (DME)  Doctor Visits Discussed/Reviewed Doctor Visits Discussed, Specialist, PCP  Durable Medical Equipment (DME) BP Cuff, Glucomoter  [BP this am 106/60 before eating-then 124/70 after breakfast, cbg non-fasting 157]  PCP/Specialist Visits Compliance with follow-up visit  Education Interventions   Education Provided Provided Education  Provided Verbal Education On Nutrition, When to see the doctor, Medication, Other  [resp sx mgmt]  Nutrition Interventions   Nutrition Discussed/Reviewed Nutrition Discussed, Adding fruits and vegetables, Decreasing sugar intake, Decreasing salt  Pharmacy Interventions   Pharmacy Dicussed/Reviewed Pharmacy Topics Discussed, Medications and their functions       Alessandra Grout [facility] Care Management Care Management Community Coordinator Direct Phone: [phone] Toll Free: [phone] Fax: [phone]

## 2022-12-04 NOTE — Chronic Care Management (AMB) (Signed)
   12/04/2022  Alyssa Ford [birth year] [id number]  Reason for Encounter: Patient no longer enrolled in the CCM program. Enrollment status updated.  [name]/Chronic Care Management [phone]

## 2022-12-04 NOTE — Transitions of Care (Post Inpatient/ED Visit) (Signed)
   12/04/2022  Name: Alyssa Ford MRN: [id number] DOB: 1951/05/17  Today's TOC FU Call Status: Today's TOC FU Call Status:: Unsuccessul Call (1st Attempt) Unsuccessful Call (1st Attempt) Date: 12/04/22  Attempted to reach the patient regarding the most recent Inpatient/ED visit.  Follow Up Plan: Additional outreach attempts will be made to reach the patient to complete the Transitions of Care (Post Inpatient/ED visit) call.     Antionette Fairy, RN,BSN,CCM [facility] Care Management Care Management Community Coordinator Direct Phone: [phone] Toll Free: [phone] Fax: [phone]

## 2022-12-13 ENCOUNTER — Other Ambulatory Visit: Payer: Self-pay | Admitting: Family Medicine

## 2022-12-14 ENCOUNTER — Encounter: Payer: Self-pay | Admitting: Family Medicine

## 2022-12-14 ENCOUNTER — Ambulatory Visit (INDEPENDENT_AMBULATORY_CARE_PROVIDER_SITE_OTHER): Payer: Medicare Other | Admitting: Family Medicine

## 2022-12-14 VITALS — BP 120/60 | HR 82 | Temp 97.6°F | Resp 18 | Ht 66.0 in | Wt 143.8 lb

## 2022-12-14 DIAGNOSIS — E876 Hypokalemia: Secondary | ICD-10-CM | POA: Diagnosis not present

## 2022-12-14 DIAGNOSIS — E1169 Type 2 diabetes mellitus with other specified complication: Secondary | ICD-10-CM

## 2022-12-14 DIAGNOSIS — E785 Hyperlipidemia, unspecified: Secondary | ICD-10-CM

## 2022-12-14 DIAGNOSIS — Z7984 Long term (current) use of oral hypoglycemic drugs: Secondary | ICD-10-CM

## 2022-12-14 DIAGNOSIS — J189 Pneumonia, unspecified organism: Secondary | ICD-10-CM | POA: Diagnosis not present

## 2022-12-14 DIAGNOSIS — E1165 Type 2 diabetes mellitus with hyperglycemia: Secondary | ICD-10-CM | POA: Diagnosis not present

## 2022-12-14 DIAGNOSIS — I10 Essential (primary) hypertension: Secondary | ICD-10-CM | POA: Diagnosis not present

## 2022-12-14 LAB — COMPREHENSIVE METABOLIC PANEL
ALT: 14 U/L (ref 0–35)
AST: 13 U/L (ref 0–37)
Albumin: 3.7 g/dL (ref 3.5–5.2)
Alkaline Phosphatase: 54 U/L (ref 39–117)
BUN: 17 mg/dL (ref 6–23)
CO2: 27 mEq/L (ref 19–32)
Calcium: 8.8 mg/dL (ref 8.4–10.5)
Chloride: 99 mEq/L (ref 96–112)
Creatinine, Ser: 0.98 mg/dL (ref 0.40–1.20)
GFR: 57.57 mL/min — ABNORMAL LOW (ref >60.00)
Glucose, Bld: 121 mg/dL — ABNORMAL HIGH (ref 70–99)
Potassium: 3.9 mEq/L (ref 3.5–5.1)
Sodium: 138 mEq/L (ref 135–145)
Total Bilirubin: 0.3 mg/dL (ref 0.2–1.2)
Total Protein: 7.4 g/dL (ref 6.0–8.3)

## 2022-12-14 NOTE — Assessment & Plan Note (Signed)
Symptoms have resolved Pt has finished the abx

## 2022-12-14 NOTE — Assessment & Plan Note (Signed)
Encourage heart healthy diet such as MIND or DASH diet, increase exercise, avoid trans fats, simple carbohydrates and processed foods, consider a krill or fish or flaxseed oil cap daily.  °

## 2022-12-14 NOTE — Assessment & Plan Note (Signed)
Well controlled, no changes to meds. Encouraged heart healthy diet such as the DASH diet and exercise as tolerated.  °

## 2022-12-14 NOTE — Assessment & Plan Note (Signed)
Tolerating statin, encouraged heart healthy diet, avoid trans fats, minimize simple carbs and saturated fats. Increase exercise as tolerated 

## 2022-12-14 NOTE — Progress Notes (Signed)
Established Patient Office Visit  Subjective   Patient ID: Alyssa Ford, female    DOB: 02-Mar-1951  Age: 72 y.o. MRN: [id number]  Chief Complaint  Patient presents with   Hospitalization Follow-up    HPI Discussed the use of AI scribe software for clinical note transcription with the patient, who gave verbal consent to proceed.  History of Present Illness   The patient, with a history of pneumonia and sepsis, presents for a post-hospitalization follow-up. They were admitted on June 6th with a left lower lobe pneumonia and sepsis, which started as a head cold with fever and back pain. They were treated with IV antibiotics and steroids, and discharged on Augmentin. They also had low blood pressure and low magnesium levels during the hospital stay, which required medication. The patient reports that their back still hurts, especially when they sneeze or cough. They also report feeling very tired.  The patient also has a history of high blood sugar, which was elevated during the hospital stay, possibly due to the steroids. They have been checking their blood sugar at home, with readings ranging from 155 to 255. They also report that their blood pressure has been normal at home.      Patient Active Problem List   Diagnosis Date Noted   Hypokalemia 12/14/2022   Community acquired pneumonia of left lower lobe of lung 11/26/2022   Septic shock (HCC) 11/26/2022   Slow transit constipation 08/18/2022   Preventative health care 02/05/2022   Pulmonary HTN (HCC) 07/04/2021   Hyperlipidemia associated with type 2 diabetes mellitus (HCC) 07/04/2021   Atherosclerosis of aorta (HCC) 07/04/2021   Angioma of skin 06/02/2021   Vaginal itching 05/23/2021   Hematuria 05/23/2021   Insomnia 05/13/2021   Hypothyroidism 08/24/2020   Chest pain 08/06/2020   Trichimoniasis 04/27/2020   Cervical cancer screening 04/27/2020   Left thyroid nodule 04/13/2020   Vaginal odor 01/11/2020   Icterus 01/11/2020    Uncontrolled type 2 diabetes mellitus with hyperglycemia (HCC) 09/22/2019   De Quervain's disease (tenosynovitis) 09/22/2019   Neck mass 07/11/2018   Abnormal WBC count 05/29/2017   Skin fissure 09/24/2015   Allergic rhinitis 03/26/2014   Wheezing 03/26/2014   DM (diabetes mellitus) type II uncontrolled, periph vascular disorder 08/25/2011   Headache(784.0) 08/21/2011   Acute sinusitis 04/26/2010   URI 04/26/2010   UNSPECIFIED CATARACT 02/23/2010   POSTMENOPAUSAL STATUS 12/12/2007   FATIGUE 09/11/2007   THYROID NODULE 11/22/2006   Diabetes (HCC) 11/22/2006   Hyperlipidemia LDL goal <70 11/22/2006   GLUCOMA 11/22/2006   Essential hypertension 11/22/2006   GERD 11/22/2006   Past Medical History:  Diagnosis Date   Anemia    Anxiety    Arthritis    Cataract    Chronic headaches    Depression    Diabetes mellitus    Diverticulosis of colon (without mention of hemorrhage) 2003, 2008   Colonoscopy   Eczema    GERD (gastroesophageal reflux disease)    Glaucoma    Hiatal hernia 2003   EGD    Hyperlipidemia    Hypertension    Thyroid nodule    Past Surgical History:  Procedure Laterality Date   CARPAL TUNNEL RELEASE  2006   CARPAL TUNNEL RELEASE  12/24/2007   CATARACT EXTRACTION, BILATERAL     [place]---  sept and oct 2018   CERVICAL FUSION  3/08   CHOLECYSTECTOMY     FASCIECTOMY Left 08/01/2018   Procedure: FASCIECTOMY LEFT RING;  Surgeon: Cindee Salt, MD;  Location: [facility];  Service: Orthopedics;  Laterality: Left;  UPPER ARM   TRIGGER FINGER RELEASE Left 08/01/2018   Procedure: RELEASE TRIGGER FINGER/A-1 PULLEY LEFTRING;  Surgeon: Cindee Salt, MD;  Location: [facility];  Service: Orthopedics;  Laterality: Left;   Social History   Tobacco Use   Smoking status: Former    Packs/day: 1.40    Years: 49.00    Additional pack years: 0.00    Total pack years: 68.60    Types: Cigarettes    Quit date: 07/16/2015    Years since quitting: 7.4     Passive exposure: Past   Smokeless tobacco: Never   Tobacco comments:    Continues to vape but states pod do not contain nicotine  Vaping Use   Vaping Use: Former  Substance Use Topics   Alcohol use: Yes    Alcohol/week: 0.0 standard drinks of alcohol    Comment: once weekly; wine and beer   Drug use: No   Social History   Socioeconomic History   Marital status: Married    Spouse name: Not on file   Number of children: Not on file   Years of education: Not on file   Highest education level: 12th grade  Occupational History   Occupation: retired  Tobacco Use   Smoking status: Former    Packs/day: 1.40    Years: 49.00    Additional pack years: 0.00    Total pack years: 68.60    Types: Cigarettes    Quit date: 07/16/2015    Years since quitting: 7.4    Passive exposure: Past   Smokeless tobacco: Never   Tobacco comments:    Continues to vape but states pod do not contain nicotine  Vaping Use   Vaping Use: Former  Substance and Sexual Activity   Alcohol use: Yes    Alcohol/week: 0.0 standard drinks of alcohol    Comment: once weekly; wine and beer   Drug use: No   Sexual activity: Not Currently    Partners: Male  Other Topics Concern   Not on file  Social History Narrative   Exercise--  walk   Social Determinants of Health   Financial Resource Strain: Medium Risk (12/23/2021)   Overall Financial Resource Strain (CARDIA)    Difficulty of Paying Living Expenses: Somewhat hard  Food Insecurity: No Food Insecurity (12/04/2022)   Hunger Vital Sign    Worried About Running Out of Food in the Last Year: Never true    Ran Out of Food in the Last Year: Never true  Transportation Needs: No Transportation Needs (12/04/2022)   PRAPARE - Administrator, Civil Service (Medical): No    Lack of Transportation (Non-Medical): No  Physical Activity: Insufficiently Active (07/03/2022)   Exercise Vital Sign    Days of Exercise per Week: 4 days    Minutes of Exercise  per Session: 30 min  Stress: No Stress Concern Present (07/03/2022)   [employer] of Occupational Health - Occupational Stress Questionnaire    Feeling of Stress : Only a little  Social Connections: Moderately Isolated (07/03/2022)   Social Connection and Isolation Panel [NHANES]    Frequency of Communication with Friends and Family: More than three times a week    Frequency of Social Gatherings with Friends and Family: Twice a week    Attends Religious Services: Never    Database administrator or Organizations: No    Attends Banker Meetings: Not on file  Marital Status: Married  Catering manager Violence: Not At Risk (11/26/2022)   Humiliation, Afraid, Rape, and Kick questionnaire    Fear of Current or Ex-Partner: No    Emotionally Abused: No    Physically Abused: No    Sexually Abused: No   Family Status  Relation Name Status   Mother  Deceased   Father  Deceased   Sister  Alive   Sister  Alive   Brother  Deceased at age 56       MI   Mat Aunt  Deceased at age 37   Cousin  Alive   Neg Hx  (Not Specified)   Family History  Problem Relation Age of Onset   Angioedema Mother    Dementia Mother    Breast cancer Mother 41   Kidney failure Mother 56       died from kidney failure from abx given to her for pneumonia   Hyperlipidemia Mother    Hypertension Mother    Diabetes Mother    Cancer Mother 65       breast   Parkinsonism Father    Diabetes Father 67   Hypertension Sister    Diabetes Brother    Hypertension Brother    Heart attack Brother    Heart disease Cousin 14       mi   Colon cancer Neg Hx    Esophageal cancer Neg Hx    Stomach cancer Neg Hx    Allergies  Allergen Reactions   Codeine Nausea Only    Severe nausea      Review of Systems  Constitutional:  Negative for fever and malaise/fatigue.  HENT:  Negative for congestion.   Eyes:  Negative for blurred vision.  Respiratory:  Negative for shortness of breath.   Cardiovascular:   Negative for chest pain, palpitations and leg swelling.  Gastrointestinal:  Negative for abdominal pain, blood in stool and nausea.  Genitourinary:  Negative for dysuria and frequency.  Musculoskeletal:  Negative for falls.  Skin:  Negative for rash.  Neurological:  Negative for dizziness, loss of consciousness and headaches.  Endo/Heme/Allergies:  Negative for environmental allergies.  Psychiatric/Behavioral:  Negative for depression. The patient is not nervous/anxious.       Objective:     BP 120/60 (BP Location: Left Arm, Patient Position: Sitting, Cuff Size: Normal)   Pulse 82   Temp 97.6 F (36.4 C) (Oral)   Resp 18   Ht 5\' 6"  (1.676 m)   Wt 143 lb 12.8 oz (65.2 kg)   SpO2 97%   BMI 23.21 kg/m  BP Readings from Last 3 Encounters:  12/14/22 120/60  12/01/22 (!) 155/75  10/24/22 128/80   Wt Readings from Last 3 Encounters:  12/14/22 143 lb 12.8 oz (65.2 kg)  12/01/22 144 lb 10 oz (65.6 kg)  10/24/22 147 lb 6.4 oz (66.9 kg)   SpO2 Readings from Last 3 Encounters:  12/14/22 97%  12/01/22 99%  10/24/22 98%      Physical Exam Vitals and nursing note reviewed.  Constitutional:      Appearance: She is well-developed.  HENT:     Head: Normocephalic and atraumatic.  Eyes:     Conjunctiva/sclera: Conjunctivae normal.  Neck:     Thyroid: No thyromegaly.     Vascular: No carotid bruit or JVD.  Cardiovascular:     Rate and Rhythm: Normal rate and regular rhythm.     Heart sounds: Normal heart sounds. No murmur heard. Pulmonary:  Effort: Pulmonary effort is normal. No respiratory distress.     Breath sounds: Normal breath sounds. No wheezing or rales.  Chest:     Chest wall: No tenderness.  Musculoskeletal:     Cervical back: Normal range of motion and neck supple.  Neurological:     General: No focal deficit present.     Mental Status: She is alert and oriented to person, place, and time.  Psychiatric:        Mood and Affect: Mood normal.        Behavior:  Behavior normal.        Thought Content: Thought content normal.        Judgment: Judgment normal.      No results found for any visits on 12/14/22.  Last CBC Lab Results  Component Value Date   WBC 6.6 11/30/2022   HGB 11.0 (L) 11/30/2022   HCT 32.6 (L) 11/30/2022   MCV 84.0 11/30/2022   MCH 28.4 11/30/2022   RDW 14.4 11/30/2022   PLT 223 11/30/2022   Last metabolic panel Lab Results  Component Value Date   GLUCOSE 171 (H) 12/01/2022   NA 141 12/01/2022   K 3.6 12/01/2022   CL 102 12/01/2022   CO2 27 12/01/2022   BUN 40 (H) 12/01/2022   CREATININE 1.14 (H) 12/01/2022   GFRNONAA 51 (L) 12/01/2022   CALCIUM 8.8 (L) 12/01/2022   PHOS 8.7 (H) 11/27/2022   PROT 6.9 12/01/2022   ALBUMIN 3.0 (L) 12/01/2022   BILITOT 0.3 12/01/2022   ALKPHOS 58 12/01/2022   AST 21 12/01/2022   ALT 21 12/01/2022   ANIONGAP 12 12/01/2022   Last lipids Lab Results  Component Value Date   CHOL 154 08/18/2022   HDL 44.70 08/18/2022   LDLCALC 81 08/18/2022   LDLDIRECT 87.0 06/07/2020   TRIG 144.0 08/18/2022   CHOLHDL 3 08/18/2022   Last hemoglobin A1c Lab Results  Component Value Date   HGBA1C 6.5 10/19/2022   Last thyroid functions Lab Results  Component Value Date   TSH 3.04 10/19/2022   Last vitamin D Lab Results  Component Value Date   VD25OH 43 02/23/2010   Last vitamin B12 and Folate Lab Results  Component Value Date   VITAMINB12 1,337 (H) 04/17/2019   FOLATE 6.7 09/11/2007      The 10-year ASCVD risk score (Arnett DK, et al., 2019) is: 35.3%    Assessment & Plan:   Problem List Items Addressed This Visit       Unprioritized   Uncontrolled type 2 diabetes mellitus with hyperglycemia (HCC)   Relevant Orders   Comprehensive metabolic panel   Hypokalemia   Relevant Orders   Comprehensive metabolic panel   Hyperlipidemia LDL goal <70    Tolerating statin, encouraged heart healthy diet, avoid trans fats, minimize simple carbs and saturated fats. Increase  exercise as tolerated       Hyperlipidemia associated with type 2 diabetes mellitus (HCC)    Encourage heart healthy diet such as MIND or DASH diet, increase exercise, avoid trans fats, simple carbohydrates and processed foods, consider a krill or fish or flaxseed oil cap daily.        Essential hypertension    Well controlled, no changes to meds. Encouraged heart healthy diet such as the DASH diet and exercise as tolerated.        Community acquired pneumonia of left lower lobe of lung - Primary    Symptoms have resolved Pt has finished the abx  Assessment and Plan    Left Lower Lobe Pneumonia: Resolved after hospitalization with IV antibiotics and steroids. Discharged on Augmentin. No current respiratory symptoms. Lung sounds clear on examination. -No further action required at this time.  Post-Infectious Fatigue: Patient reports ongoing fatigue following recent hospitalization for pneumonia and sepsis. -Reassured that fatigue is normal post-infection and will improve over time.  Hypertension: Blood pressure well controlled on current medication. -Continue current medication regimen.  Hyperglycemia: Elevated blood glucose levels noted during hospitalization, potentially due to steroid use. Patient has been monitoring blood glucose at home with variable readings. -Order lab tests to check potassium and blood glucose levels.  Follow-up in August as scheduled.         Return if symptoms worsen or fail to improve.    Donato Schultz, DO

## 2022-12-14 NOTE — Patient Instructions (Signed)
Community-Acquired Pneumonia, Adult Pneumonia is a lung infection that causes inflammation and the buildup of mucus and fluids in the lungs. This may cause coughing and difficulty breathing. Community-acquired pneumonia is pneumonia that develops in people who are not, and have not recently been, in a hospital or other health care facility. Usually, pneumonia develops as a result of an illness that is caused by a virus, such as the common cold and the flu (influenza). It can also be caused by bacteria or fungi. While the common cold and influenza can pass from person to person (are contagious), pneumonia itself is not considered contagious. What are the causes? This condition may be caused by: Viruses. Bacteria. Fungi. What increases the risk? The following factors may make you more likely to develop this condition: Being over age 65 or having certain medical conditions, such as: A long-term (chronic) disease, such as: chronic obstructive pulmonary disease (COPD), asthma, heart failure, diabetes, or kidney disease. A condition that increases the risk of breathing in (aspirating) mucus and other fluids from your mouth and nose. A weakened body defense system (immune system). Having had your spleen removed (splenectomy). The spleen is the organ that helps fight germs and infections. Not cleaning your teeth and gums well (poor dental hygiene). Using tobacco products. Traveling to places where germs that cause pneumonia are present or being near certain animals or animal habitats that could have germs that cause pneumonia. What are the signs or symptoms? Symptoms of this condition include: A dry cough or a wet (productive) cough. A fever, sweating, or chills. Chest pain, especially when breathing deeply or coughing. Fast breathing, difficulty breathing, or shortness of breath. Tiredness (fatigue) and muscle aches. How is this diagnosed? This condition may be diagnosed based on your medical  history or a physical exam. You may also have tests, including: Imaging, such as a chest X-ray or lung ultrasound. Tests of: The level of oxygen and other gases in your blood. Mucus from your lungs (sputum). Fluid around your lungs (pleural fluid). Your urine. How is this treated? Treatment for this condition depends on many factors, such as the cause of your pneumonia, your medicines, and other medical conditions that you have. For most adults, pneumonia may be treated at home. In some cases, treatment must happen in a hospital and may include: Medicines that are given by mouth (orally) or through an IV, including: Antibiotic medicines, if bacteria caused the pneumonia. Medicines that kill viruses (antiviral medicines), if a virus caused the pneumonia. Oxygen therapy. Severe pneumonia, although rare, may require the following treatments: Mechanical ventilation.This procedure uses a machine to help you breathe if you cannot breathe well on your own or maintain a safe level of blood oxygen. Thoracentesis. This procedure removes any buildup of pleural fluid to help with breathing. Follow these instructions at home:  Medicines Take over-the-counter and prescription medicines only as told by your health care provider. Take cough medicine only if you have trouble sleeping. Cough medicine can prevent your body from removing mucus from your lungs. If you were prescribed antibiotics, take them as told by your health care provider. Do not stop taking the antibiotic even if you start to feel better. Lifestyle     Do not drink alcohol. Do not use any products that contain nicotine or tobacco. These products include cigarettes, chewing tobacco, and vaping devices, such as e-cigarettes. If you need help quitting, ask your health care provider. Eat a healthy diet. This includes plenty of vegetables, fruits, whole grains, low-fat   dairy products, and lean protein. General instructions Rest a lot and  get at least 8 hours of sleep each night. Sleep in a partly upright position at night. Place a few pillows under your head or sleep in a reclining chair. Return to your normal activities as told by your health care provider. Ask your health care provider what activities are safe for you. Drink enough fluid to keep your urine pale yellow. This helps to thin the mucus in your lungs. If your throat is sore, gargle with a mixture of salt and water 3-4 times a day or as needed. To make salt water, completely dissolve -1 tsp (3-6 g) of salt in 1 cup (237 mL) of warm water. Keep all follow-up visits. How is this prevented? You can lower your risk of developing community-acquired pneumonia by: Getting the pneumonia vaccine. There are different types and schedules of pneumonia vaccines. Ask your health care provider which option is best for you. Consider getting the pneumonia vaccine if: You are older than 73 years of age. You are 19-65 years of age and are receiving cancer treatment, have chronic lung disease, or have other medical conditions that affect your immune system. Ask your health care provider if this applies to you. Getting your influenza vaccine every year. Ask your health care provider which type of vaccine is best for you. Getting regular dental checkups. Washing your hands often with soap and water for at least 20 seconds. If soap and water are not available, use hand sanitizer. Contact a health care provider if: You have a fever. You have trouble sleeping because you cannot control your cough with cough medicine. Get help right away if: Your shortness of breath becomes worse. Your chest pain increases. Your sickness becomes worse, especially if you are an older adult or have a weak immune system. You cough up blood. These symptoms may be an emergency. Get help right away. Call 911. Do not wait to see if the symptoms will go away. Do not drive yourself to the  hospital. Summary Pneumonia is an infection of the lungs. Community-acquired pneumonia develops in people who have not been in the hospital. It can be caused by bacteria, viruses, or fungi. This condition may be treated with antibiotics or antiviral medicines. Severe pneumonia may require a hospital stay and treatment to help with breathing. This information is not intended to replace advice given to you by your health care provider. Make sure you discuss any questions you have with your health care provider. Document Revised: 08/10/2021 Document Reviewed: 08/10/2021 Elsevier Patient Education  2024 [employer]  

## 2022-12-28 ENCOUNTER — Other Ambulatory Visit: Payer: Self-pay | Admitting: Family Medicine

## 2022-12-29 ENCOUNTER — Ambulatory Visit (HOSPITAL_BASED_OUTPATIENT_CLINIC_OR_DEPARTMENT_OTHER)
Admission: RE | Admit: 2022-12-29 | Discharge: 2022-12-29 | Disposition: A | Discharge status: Home / Self Care | Payer: Medicare Other | Source: Ambulatory Visit | Attending: Family | Admitting: Family

## 2022-12-29 ENCOUNTER — Other Ambulatory Visit: Payer: Self-pay

## 2022-12-29 ENCOUNTER — Emergency Department (HOSPITAL_BASED_OUTPATIENT_CLINIC_OR_DEPARTMENT_OTHER)
Admission: EM | Admit: 2022-12-29 | Discharge: 2022-12-29 | Disposition: A | Discharge status: Home / Self Care | Payer: Medicare Other | Attending: Emergency Medicine | Admitting: Emergency Medicine

## 2022-12-29 ENCOUNTER — Ambulatory Visit (INDEPENDENT_AMBULATORY_CARE_PROVIDER_SITE_OTHER): Payer: Medicare Other | Admitting: Family

## 2022-12-29 ENCOUNTER — Encounter (HOSPITAL_BASED_OUTPATIENT_CLINIC_OR_DEPARTMENT_OTHER): Payer: Self-pay | Admitting: Emergency Medicine

## 2022-12-29 ENCOUNTER — Emergency Department (HOSPITAL_BASED_OUTPATIENT_CLINIC_OR_DEPARTMENT_OTHER): Payer: Medicare Other

## 2022-12-29 ENCOUNTER — Other Ambulatory Visit (HOSPITAL_BASED_OUTPATIENT_CLINIC_OR_DEPARTMENT_OTHER): Payer: Self-pay

## 2022-12-29 VITALS — BP 142/80 | HR 81 | Temp 98.2°F | Resp 18 | Ht 66.0 in | Wt 143.6 lb

## 2022-12-29 DIAGNOSIS — J189 Pneumonia, unspecified organism: Secondary | ICD-10-CM

## 2022-12-29 DIAGNOSIS — R110 Nausea: Secondary | ICD-10-CM | POA: Insufficient documentation

## 2022-12-29 DIAGNOSIS — K5900 Constipation, unspecified: Secondary | ICD-10-CM | POA: Diagnosis not present

## 2022-12-29 DIAGNOSIS — R109 Unspecified abdominal pain: Secondary | ICD-10-CM | POA: Diagnosis not present

## 2022-12-29 DIAGNOSIS — Z7982 Long term (current) use of aspirin: Secondary | ICD-10-CM | POA: Insufficient documentation

## 2022-12-29 LAB — COMPREHENSIVE METABOLIC PANEL
ALT: 13 U/L (ref 0–44)
AST: 18 U/L (ref 15–41)
Albumin: 4.4 g/dL (ref 3.5–5.0)
Alkaline Phosphatase: 40 U/L (ref 38–126)
Anion gap: 14 (ref 5–15)
BUN: 15 mg/dL (ref 8–23)
CO2: 23 mmol/L (ref 22–32)
Calcium: 7.3 mg/dL — ABNORMAL LOW (ref 8.9–10.3)
Chloride: 101 mmol/L (ref 98–111)
Creatinine, Ser: 0.96 mg/dL (ref 0.44–1.00)
GFR, Estimated: >60 mL/min (ref >60)
Glucose, Bld: 101 mg/dL — ABNORMAL HIGH (ref 70–99)
Potassium: 3.7 mmol/L (ref 3.5–5.1)
Sodium: 138 mmol/L (ref 135–145)
Total Bilirubin: 0.4 mg/dL (ref 0.3–1.2)
Total Protein: 7.4 g/dL (ref 6.5–8.1)

## 2022-12-29 LAB — CBC
HCT: 33.3 % — ABNORMAL LOW (ref 36.0–46.0)
Hemoglobin: 11 g/dL — ABNORMAL LOW (ref 12.0–15.0)
MCH: 28.6 pg (ref 26.0–34.0)
MCHC: 33 g/dL (ref 30.0–36.0)
MCV: 86.5 fL (ref 80.0–100.0)
Platelets: 262 10*3/uL (ref 150–400)
RBC: 3.85 MIL/uL — ABNORMAL LOW (ref 3.87–5.11)
RDW: 14.3 % (ref 11.5–15.5)
WBC: 9.6 10*3/uL (ref 4.0–10.5)
nRBC: 0 % (ref 0.0–0.2)

## 2022-12-29 LAB — URINALYSIS, ROUTINE W REFLEX MICROSCOPIC
Bacteria, UA: NONE SEEN
Bilirubin Urine: NEGATIVE
Glucose, UA: NEGATIVE mg/dL
Hgb urine dipstick: NEGATIVE
Ketones, ur: NEGATIVE mg/dL
Nitrite: NEGATIVE
Protein, ur: NEGATIVE mg/dL
Specific Gravity, Urine: 1.01 (ref 1.005–1.030)
pH: 6 (ref 5.0–8.0)

## 2022-12-29 LAB — CBC WITH DIFFERENTIAL/PLATELET
Basophils Relative: 0.4 %
HCT: 31.5 % — ABNORMAL LOW (ref 35.0–45.0)
RBC: 3.71 10*6/uL — ABNORMAL LOW (ref 3.80–5.10)
Total Lymphocyte: 34.1 %

## 2022-12-29 LAB — LIPASE, BLOOD: Lipase: 27 U/L (ref 11–51)

## 2022-12-29 LAB — TROPONIN I (HIGH SENSITIVITY): Troponin I (High Sensitivity): 7 ng/L (ref <18)

## 2022-12-29 MED ORDER — METOCLOPRAMIDE HCL 5 MG/ML IJ SOLN
10.0000 mg | Freq: Once | INTRAMUSCULAR | Status: AC
  Administered 2022-12-29: 10 mg via INTRAVENOUS
  Filled 2022-12-29: qty 2

## 2022-12-29 MED ORDER — ONDANSETRON 8 MG PO TBDP
8.0000 mg | ORAL_TABLET | Freq: Three times a day (TID) | ORAL | 0 refills | Status: DC | PRN
  Filled 2022-12-29: qty 20, 7d supply, fill #0

## 2022-12-29 MED ORDER — IOHEXOL 300 MG/ML  SOLN
100.0000 mL | Freq: Once | INTRAMUSCULAR | Status: AC | PRN
  Administered 2022-12-29: 100 mL via INTRAVENOUS

## 2022-12-29 MED ORDER — ONDANSETRON 4 MG PO TBDP: 4.0000 mg | ORAL_TABLET | Freq: Three times a day (TID) | ORAL | 0 refills | Status: DC | PRN

## 2022-12-29 NOTE — Progress Notes (Signed)
I spoke to patient about CXR results. She notes that she had been feeling much worse since leaving our office and was concerned about her symptoms. Both patient and I in agreement that ER evaluation was appropriate. She notes she would be going to [facility] ER.

## 2022-12-29 NOTE — ED Notes (Signed)
Spoke with lab to add on trop 

## 2022-12-29 NOTE — ED Provider Notes (Signed)
[place] EMERGENCY DEPARTMENT AT [facility] Provider Note   CSN: [id number] Arrival date & time: 12/29/22  1639     History  Chief Complaint  Patient presents with   Nausea    Alyssa Ford is a 72 y.o. female presented to the ER with onset of nausea and poor appetite.  The patient was treated for pneumonia recently having completed antibiotics after hospitalization last month for sepsis pneumonia with Streptococcus in her bloodstream.  She completed his antibiotics without any adverse issues.  She followed up in the office today and had an x-ray performed showed resolution of her prior pneumonia.  She is after office visit she began to feel nauseated and has had persistent nausea sensation since then.  She denies shortness of breath or chest pain.    HPI     Home Medications Prior to Admission medications   Medication Sig Start Date End Date Taking? Authorizing Provider  ondansetron (ZOFRAN-ODT) 4 MG disintegrating tablet Take 1 tablet (4 mg total) by mouth every 8 (eight) hours as needed for up to 14 doses for nausea or vomiting. 12/29/22  Yes Terald Sleeper, MD  amitriptyline (ELAVIL) 25 MG tablet TAKE 2 TABLETS BY MOUTH DAILY AT 12 PM NOON 12/29/22   [name], Grayling Congress, DO  aspirin EC 81 MG tablet Take 81 mg by mouth daily.    [name], Yvonne R, DO  azelastine (ASTELIN) 0.1 % nasal spray Place 1 spray into both nostrils 2 (two) times daily. Use in each nostril as directed Patient taking differently: Place 1 spray into both nostrils 2 (two) times daily as needed for allergies. Use in each nostril as directed 06/02/22   [name], Myrene Buddy R, DO  BIOTIN PO Take 1 tablet by mouth daily.     [provider]  Coenzyme Q10 (CO Q 10 PO) Take 1 capsule by mouth daily.    [provider]  fenofibrate 160 MG tablet TAKE 1 TABLET(160 MG) BY MOUTH DAILY 12/13/22   [name], Grayling Congress, DO  Ferrous Sulfate (IRON SUPPLEMENT PO) Take 1 tablet by mouth every  other day. 65mg     [provider]  fluticasone (FLONASE) 50 MCG/ACT nasal spray Place 2 sprays into both nostrils daily. Patient taking differently: Place 2 sprays into both nostrils daily as needed for allergies. 07/28/22   [name], MD  ipratropium (ATROVENT) 0.06 % nasal spray Place 2 sprays into both nostrils 3 (three) times daily as needed for rhinitis. 07/28/22   [name], MD  latanoprost (XALATAN) 0.005 % ophthalmic solution Place 1 drop into both eyes at bedtime.  10/17/13   [provider]  levocetirizine (XYZAL) 5 MG tablet Take 1 tablet (5 mg total) by mouth every evening. 07/28/22   [name], MD  lisinopril (ZESTRIL) 5 MG tablet TAKE 1 TABLET(5 MG) BY MOUTH DAILY 11/13/22   [name], Grayling Congress, DO  metFORMIN (GLUCOPHAGE-XR) 500 MG 24 hr tablet Take 4 pills every day 10/24/22   Altamese [place], MD  Naproxen Sodium (ALEVE PO) Take 2 tablets by mouth daily as needed (pain).    [provider]  NONFORMULARY OR COMPOUNDED ITEM Cbd /delta 9 1 po qhs Patient taking differently: Take 1 tablet by mouth at bedtime. Cbd /delta 9 1 po qhs 06/02/22   [name], Grayling Congress, DO  omeprazole (PRILOSEC) 20 MG capsule Take 1 capsule (20 mg total) by mouth daily. 07/14/22   Donato Schultz, DO  ondansetron (ZOFRAN-ODT) 8 MG disintegrating tablet Take 1 tablet (8 mg total) by mouth every 8 (eight) hours as needed for nausea or vomiting. 12/29/22   Olive Bass, FNP  repaglinide (PRANDIN) 1 MG tablet Take 2 tablets (2 mg total) by mouth daily. Before evening meal. 10/24/22   Motwani, Carin Hock, MD  rosuvastatin (CRESTOR) 40 MG tablet Take 1 tablet (40 mg total) by mouth at bedtime. 11/21/22   [name], Grayling Congress, DO  Semaglutide (RYBELSUS) 3 MG TABS 1 tablet daily Patient taking differently: Take 1 tablet by mouth daily. 10/24/22   Altamese Skyline-Ganipa, MD      Allergies    Codeine    Review of Systems   Review of Systems  Physical Exam Updated Vital Signs BP  (!) 175/85   Pulse 89   Temp 97.6 F (36.4 C)   Resp 20   SpO2 96%  Physical Exam  ED Results / Procedures / Treatments   Labs (all labs ordered are listed, but only abnormal results are displayed) Labs Reviewed  COMPREHENSIVE METABOLIC PANEL - Abnormal; Notable for the following components:      Result Value   Glucose, Bld 101 (*)    Calcium 7.3 (*)    All other components within normal limits  CBC - Abnormal; Notable for the following components:   RBC 3.85 (*)    Hemoglobin 11.0 (*)    HCT 33.3 (*)    All other components within normal limits  URINALYSIS, ROUTINE W REFLEX MICROSCOPIC - Abnormal; Notable for the following components:   Leukocytes,Ua TRACE (*)    All other components within normal limits  LIPASE, BLOOD  TROPONIN I (HIGH SENSITIVITY)  TROPONIN I (HIGH SENSITIVITY)    EKG EKG Interpretation Date/Time:  Friday December 29 2022 18:49:19 EDT Ventricular Rate:  90 PR Interval:  156 QRS Duration:  84 QT Interval:  382 QTC Calculation: 467 R Axis:   -22  Text Interpretation: Normal sinus rhythm Minimal voltage criteria for LVH, may be normal variant ( R in aVL ) When compared with ECG of 26-Nov-2022 12:56, PREVIOUS ECG IS PRESENT No STEMI Confirmed by Alvester Chou [phone]) on 12/29/2022 7:17:52 PM  Radiology CT ABDOMEN PELVIS W CONTRAST  Result Date: 12/29/2022 CLINICAL DATA:  Acute abdominal pain EXAM: CT ABDOMEN AND PELVIS WITH CONTRAST TECHNIQUE: Multidetector CT imaging of the abdomen and pelvis was performed using the standard protocol following bolus administration of intravenous contrast. RADIATION DOSE REDUCTION: This exam was performed according to the departmental dose-optimization program which includes automated exposure control, adjustment of the mA and/or kV according to patient size and/or use of iterative reconstruction technique. CONTRAST:  OMNIPAQUE IOHEXOL 300 MG/ML  SOLN COMPARISON:  CT abdomen and pelvis 11/26/2022 FINDINGS: Lower chest:  There are minimal patchy ground-glass opacities in the left lower lobe, likely resolving infection. Left lower lobe consolidation has otherwise largely resolved. Hepatobiliary: No focal liver abnormality is seen. Status post cholecystectomy. No biliary dilatation. Pancreas: Unremarkable. No pancreatic ductal dilatation or surrounding inflammatory changes. Spleen: Normal in size without focal abnormality. Adrenals/Urinary Tract: Adrenal glands are unremarkable. Kidneys are normal, without renal calculi, focal lesion, or hydronephrosis. Bladder is unremarkable. Stomach/Bowel: Stomach is within normal limits. Appendix appears normal. No evidence of bowel wall thickening, distention, or inflammatory changes.There is a large amount of stool throughout the colon. Vascular/Lymphatic: Aortic atherosclerosis. No enlarged abdominal or pelvic lymph nodes. Reproductive: Uterus and bilateral adnexa are unremarkable. Other: No abdominal wall hernia or abnormality. No abdominopelvic ascites. Musculoskeletal: No  acute or significant osseous findings. IMPRESSION: 1. No acute abdominopelvic process. 2. Large amount of stool throughout the colon. 3. Minimal patchy ground-glass opacities in the left lower lobe, likely resolving infection. Left lower lobe consolidation has otherwise largely resolved. Aortic Atherosclerosis (ICD10-I70.0). Electronically Signed   By: Darliss Cheney M.D.   On: 12/29/2022 21:27   DG Chest 2 View  Result Date: 12/29/2022 CLINICAL DATA:  History of pneumonia.  Nausea for 4 days. EXAM: CHEST - 2 VIEW COMPARISON:  Chest radiographs 11/28/2022 and 11/26/2022; CT chest 09/07/2022 FINDINGS: Resolution of the prior left lower lung airspace opacity. The bilateral lungs are now clear. Cardiac silhouette and mediastinal contours are within normal limits. No pleural effusion pneumothorax. Mild multilevel degenerative disc changes of the upper thoracic spine. Cholecystectomy clips. IMPRESSION: No acute cardiopulmonary  process. Resolution of the prior left lower lung airspace opacity seen on 11/28/2022. Electronically Signed   By: Neita Garnet M.D.   On: 12/29/2022 15:02    Procedures Procedures    Medications Ordered in ED Medications  metoCLOPramide (REGLAN) injection 10 mg (10 mg Intravenous Given 12/29/22 2047)  iohexol (OMNIPAQUE) 300 MG/ML solution 100 mL (100 mLs Intravenous Contrast Given 12/29/22 2108)    ED Course/ Medical Decision Making/ A&P                             Medical Decision Making Amount and/or Complexity of Data Reviewed Labs: ordered. Radiology: ordered.  Risk Prescription drug management.   This patient presents to the ED with concern for nausea. This involves an extensive number of treatment options, and is a complaint that carries with it a high risk of complications and morbidity.  The differential diagnosis includes gastritis versus ileus versus acute biliary disease versus pancreatitis versus other  I ordered and personally interpreted labs.  The pertinent results include: No emergent findings  I ordered imaging studies including CT abdomen pelvis I independently visualized and interpreted imaging which showed large amount of retained stool in the colon, no other emergent findings I agree with the radiologist interpretation  The patient was maintained on a cardiac monitor.  I personally viewed and interpreted the cardiac monitored which showed an underlying rhythm of: Sinus rhythm  Per my interpretation the patient's ECG shows sinus rhythm with no acute ischemic findings  I ordered medication including Reglan for nausea  I have reviewed the patients home medicines and have made adjustments as needed  Test Considered: Doubt mesenteric ischemia, PE, ACS  After the interventions noted above, I reevaluated the patient and found that they have: improved   Dispostion:  After consideration of the diagnostic results and the patients response to treatment, I feel  that the patent would benefit from close outpatient follow-up.    We did discuss her constipation and discussed a bowel cleanout regimen.  She has MiraLAX already at home which she will attempt to use.  At this point he denies indication for emergent hospitalization.  I have a low suspicion for recurring bacteremia or sepsis.  Her pneumonia appears to have improved and is resolving.  She does not have respiratory symptoms.  She is tolerating p.o.  However close return precautions were discussed with the patient and her husband..         Final Clinical Impression(s) / ED Diagnoses Final diagnoses:  Constipation, unspecified constipation type    Rx / DC Orders ED Discharge Orders  Ordered    ondansetron (ZOFRAN-ODT) 4 MG disintegrating tablet  Every 8 hours PRN        12/29/22 2236              Terald Sleeper, MD 12/29/22 2319

## 2022-12-29 NOTE — Progress Notes (Signed)
Alyssa Ford is a 72 y.o. female with the following history as recorded in EpicCare:  Patient Active Problem List   Diagnosis Date Noted   Hypokalemia 12/14/2022   Community acquired pneumonia of left lower lobe of lung 11/26/2022   Septic shock (HCC) 11/26/2022   Slow transit constipation 08/18/2022   Preventative health care 02/05/2022   Pulmonary HTN (HCC) 07/04/2021   Hyperlipidemia associated with type 2 diabetes mellitus (HCC) 07/04/2021   Atherosclerosis of aorta (HCC) 07/04/2021   Angioma of skin 06/02/2021   Vaginal itching 05/23/2021   Hematuria 05/23/2021   Insomnia 05/13/2021   Hypothyroidism 08/24/2020   Chest pain 08/06/2020   Trichimoniasis 04/27/2020   Cervical cancer screening 04/27/2020   Left thyroid nodule 04/13/2020   Vaginal odor 01/11/2020   Icterus 01/11/2020   Uncontrolled type 2 diabetes mellitus with hyperglycemia (HCC) 09/22/2019   De Quervain's disease (tenosynovitis) 09/22/2019   Neck mass 07/11/2018   Abnormal WBC count 05/29/2017   Skin fissure 09/24/2015   Allergic rhinitis 03/26/2014   Wheezing 03/26/2014   DM (diabetes mellitus) type II uncontrolled, periph vascular disorder 08/25/2011   Headache(784.0) 08/21/2011   Acute sinusitis 04/26/2010   URI 04/26/2010   UNSPECIFIED CATARACT 02/23/2010   POSTMENOPAUSAL STATUS 12/12/2007   FATIGUE 09/11/2007   THYROID NODULE 11/22/2006   Diabetes (HCC) 11/22/2006   Hyperlipidemia LDL goal <70 11/22/2006   GLUCOMA 11/22/2006   Essential hypertension 11/22/2006   GERD 11/22/2006    Current Outpatient Medications  Medication Sig Dispense Refill   amitriptyline (ELAVIL) 25 MG tablet TAKE 2 TABLETS BY MOUTH DAILY AT 12 PM NOON 180 tablet 1   aspirin EC 81 MG tablet Take 81 mg by mouth daily.     azelastine (ASTELIN) 0.1 % nasal spray Place 1 spray into both nostrils 2 (two) times daily. Use in each nostril as directed (Patient taking differently: Place 1 spray into both nostrils 2 (two) times  daily as needed for allergies. Use in each nostril as directed) 30 mL 12   BIOTIN PO Take 1 tablet by mouth daily.      Coenzyme Q10 (CO Q 10 PO) Take 1 capsule by mouth daily.     fenofibrate 160 MG tablet TAKE 1 TABLET(160 MG) BY MOUTH DAILY 90 tablet 1   Ferrous Sulfate (IRON SUPPLEMENT PO) Take 1 tablet by mouth every other day. 65mg      fluticasone (FLONASE) 50 MCG/ACT nasal spray Place 2 sprays into both nostrils daily. (Patient taking differently: Place 2 sprays into both nostrils daily as needed for allergies.) 16 g 5   ipratropium (ATROVENT) 0.06 % nasal spray Place 2 sprays into both nostrils 3 (three) times daily as needed for rhinitis. 15 mL 5   latanoprost (XALATAN) 0.005 % ophthalmic solution Place 1 drop into both eyes at bedtime.      levocetirizine (XYZAL) 5 MG tablet Take 1 tablet (5 mg total) by mouth every evening. 30 tablet 5   lisinopril (ZESTRIL) 5 MG tablet TAKE 1 TABLET(5 MG) BY MOUTH DAILY 90 tablet 1   metFORMIN (GLUCOPHAGE-XR) 500 MG 24 hr tablet Take 4 pills every day 360 tablet 0   Naproxen Sodium (ALEVE PO) Take 2 tablets by mouth daily as needed (pain).     NONFORMULARY OR COMPOUNDED ITEM Cbd /delta 9 1 po qhs (Patient taking differently: Take 1 tablet by mouth at bedtime. Cbd /delta 9 1 po qhs) 30 each 0   omeprazole (PRILOSEC) 20 MG capsule Take 1 capsule (20 mg total)  by mouth daily. 90 capsule 3   ondansetron (ZOFRAN-ODT) 8 MG disintegrating tablet Take 1 tablet (8 mg total) by mouth every 8 (eight) hours as needed for nausea or vomiting. 20 tablet 0   repaglinide (PRANDIN) 1 MG tablet Take 2 tablets (2 mg total) by mouth daily. Before evening meal. 180 tablet 0   rosuvastatin (CRESTOR) 40 MG tablet Take 1 tablet (40 mg total) by mouth at bedtime. 90 tablet 1   Semaglutide (RYBELSUS) 3 MG TABS 1 tablet daily (Patient taking differently: Take 1 tablet by mouth daily.) 90 tablet 1   No current facility-administered medications for this visit.    Allergies:  Codeine  Past Medical History:  Diagnosis Date   Anemia    Anxiety    Arthritis    Cataract    Chronic headaches    Depression    Diabetes mellitus    Diverticulosis of colon (without mention of hemorrhage) 2003, 2008   Colonoscopy   Eczema    GERD (gastroesophageal reflux disease)    Glaucoma    Hiatal hernia 2003   EGD    Hyperlipidemia    Hypertension    Thyroid nodule     Past Surgical History:  Procedure Laterality Date   CARPAL TUNNEL RELEASE  2006   CARPAL TUNNEL RELEASE  12/24/2007   CATARACT EXTRACTION, BILATERAL     [facility]---  sept and oct 2018   CERVICAL FUSION  3/08   CHOLECYSTECTOMY     FASCIECTOMY Left 08/01/2018   Procedure: FASCIECTOMY LEFT RING;  Surgeon: Cindee Salt, MD;  Location: [facility];  Service: Orthopedics;  Laterality: Left;  UPPER ARM   TRIGGER FINGER RELEASE Left 08/01/2018   Procedure: RELEASE TRIGGER FINGER/A-1 PULLEY LEFTRING;  Surgeon: Cindee Salt, MD;  Location: [facility];  Service: Orthopedics;  Laterality: Left;    Family History  Problem Relation Age of Onset   Angioedema Mother    Dementia Mother    Breast cancer Mother 65   Kidney failure Mother 81       died from kidney failure from abx given to her for pneumonia   Hyperlipidemia Mother    Hypertension Mother    Diabetes Mother    Cancer Mother 80       breast   Parkinsonism Father    Diabetes Father 59   Hypertension Sister    Diabetes Brother    Hypertension Brother    Heart attack Brother    Heart disease Cousin 47       mi   Colon cancer Neg Hx    Esophageal cancer Neg Hx    Stomach cancer Neg Hx     Social History   Tobacco Use   Smoking status: Former    Packs/day: 1.40    Years: 49.00    Additional pack years: 0.00    Total pack years: 68.60    Types: Cigarettes    Quit date: 07/16/2015    Years since quitting: 7.4    Passive exposure: Past   Smokeless tobacco: Never   Tobacco comments:    Continues to vape but states pod do  not contain nicotine  Substance Use Topics   Alcohol use: Yes    Alcohol/week: 0.0 standard drinks of alcohol    Comment: once weekly; wine and beer    Subjective:  Presents with concerns for nausea x 4 days; no vomiting; no change in symptoms with eating; no fever; had loose stool on Tuesday but no further  bowel movement since that time;  Per patient, her medications are stable and blood sugars have been "good."  No abdominal pain; denies any burning or urgency with urination;    Objective:  Vitals:   12/29/22 1331  BP: (!) 142/80  Pulse: 81  Resp: 18  Temp: 98.2 F (36.8 C)  TempSrc: Temporal  SpO2: 98%  Weight: 143 lb 9.6 oz (65.1 kg)  Height: 5\' 6"  (1.676 m)    General: Well developed, well nourished, in no acute distress; does appear pale  Skin : Warm and dry.  Head: Normocephalic and atraumatic  Eyes: Sclera and conjunctiva clear; pupils round and reactive to light; extraocular movements intact  Ears: External normal; canals clear; tympanic membranes normal  Oropharynx: Pink, supple. No suspicious lesions  Neck: Supple without thyromegaly, adenopathy  Lungs: Respirations unlabored; clear to auscultation bilaterally without wheeze, rales, rhonchi  CVS exam: normal rate and regular rhythm.  Abdomen: Soft; nontender; nondistended; normoactive bowel sounds; no masses or hepatosplenomegaly  Neurologic: Alert and oriented; speech intact; face symmetrical; moves all extremities well; CNII-XII intact without focal deficit   Assessment:  1. Nausea   2. Pneumonia due to infectious organism, unspecified laterality, unspecified part of lung     Plan:   Concern for underlying UTI- patient notes she cannot do urine sample at office and would prefer to go home and bring sample back; will update CXR to ensure recent pneumonia has cleared completely; check CBC, CMP today; Rx for Zofran to help with nausea; follow up to be determined; strict ER precautions for upcoming weekend;   No  follow-ups on file.  Orders Placed This Encounter  Procedures   Urine Culture    Standing Status:   Future    Standing Expiration Date:   12/29/2023   DG Chest 2 View    Standing Status:   Future    Number of Occurrences:   1    Standing Expiration Date:   12/29/2023    Order Specific Question:   Reason for Exam (SYMPTOM  OR DIAGNOSIS REQUIRED)    Answer:   history of pneumonia    Order Specific Question:   Preferred imaging location?    Answer:   [facility]   CBC with Differential/Platelet   Comp Met (CMET)    Requested Prescriptions   Signed Prescriptions Disp Refills   ondansetron (ZOFRAN-ODT) 8 MG disintegrating tablet 20 tablet 0    Sig: Take 1 tablet (8 mg total) by mouth every 8 (eight) hours as needed for nausea or vomiting.

## 2022-12-29 NOTE — ED Triage Notes (Addendum)
Pt presents to ED POV. Pt c/o nausea since Tuesday. Reports that nothing is coming up. Given zofran w/o relief. Denies abd pain, diarrhea

## 2022-12-29 NOTE — ED Notes (Signed)
RN reviewed discharge instructions with pt. Pt verbalized understanding and had no further questions. VSS upon discharge.  

## 2022-12-30 LAB — COMPREHENSIVE METABOLIC PANEL
AG Ratio: 1.4 (calc) (ref 1.0–2.5)
ALT: 12 U/L (ref 6–29)
AST: 18 U/L (ref 10–35)
Albumin: 4.1 g/dL (ref 3.6–5.1)
Alkaline phosphatase (APISO): 44 U/L (ref 37–153)
BUN/Creatinine Ratio: 16 (calc) (ref 6–22)
BUN: 16 mg/dL (ref 7–25)
CO2: 25 mmol/L (ref 20–32)
Calcium: 7.3 mg/dL — ABNORMAL LOW (ref 8.6–10.4)
Chloride: 105 mmol/L (ref 98–110)
Creat: 1.01 mg/dL — ABNORMAL HIGH (ref 0.60–1.00)
Globulin: 2.9 g/dL (calc) (ref 1.9–3.7)
Glucose, Bld: 77 mg/dL (ref 65–99)
Potassium: 3.9 mmol/L (ref 3.5–5.3)
Sodium: 142 mmol/L (ref 135–146)
Total Bilirubin: 0.3 mg/dL (ref 0.2–1.2)
Total Protein: 7 g/dL (ref 6.1–8.1)

## 2022-12-30 LAB — CBC WITH DIFFERENTIAL/PLATELET
Absolute Monocytes: 572 cells/uL (ref 200–950)
Basophils Absolute: 39 cells/uL (ref 0–200)
Eosinophils Absolute: 87 cells/uL (ref 15–500)
Eosinophils Relative: 0.9 %
Hemoglobin: 10.4 g/dL — ABNORMAL LOW (ref 11.7–15.5)
Lymphs Abs: 3308 cells/uL (ref 850–3900)
MCH: 28 pg (ref 27.0–33.0)
MCHC: 33 g/dL (ref 32.0–36.0)
MCV: 84.9 fL (ref 80.0–100.0)
MPV: 10.2 fL (ref 7.5–12.5)
Monocytes Relative: 5.9 %
Neutro Abs: 5694 cells/uL (ref 1500–7800)
Neutrophils Relative %: 58.7 %
Platelets: 289 10*3/uL (ref 140–400)
RDW: 13.6 % (ref 11.0–15.0)
WBC: 9.7 10*3/uL (ref 3.8–10.8)

## 2023-01-02 ENCOUNTER — Other Ambulatory Visit: Payer: Self-pay | Admitting: Family

## 2023-01-02 DIAGNOSIS — D509 Iron deficiency anemia, unspecified: Secondary | ICD-10-CM

## 2023-01-03 ENCOUNTER — Telehealth: Payer: Self-pay

## 2023-01-03 NOTE — Telephone Encounter (Signed)
Transition Care Management Unsuccessful Follow-up Telephone Call  Date of discharge and from where:  12/29/2022 [facility]  Attempts:  1st Attempt  Reason for unsuccessful TCM follow-up call:  No answer/busy  Journey Castonguay [facility]  [facility] Population Health Community Resource Care Guide   [email or web address]  ?? [phone]   Website: [email or web address]  [email or web address]

## 2023-01-04 ENCOUNTER — Telehealth: Payer: Self-pay

## 2023-01-04 NOTE — Telephone Encounter (Signed)
Transition Care Management Unsuccessful Follow-up Telephone Call  Date of discharge and from where:  12/29/2022 [facility]  Attempts:  2nd Attempt  Reason for unsuccessful TCM follow-up call:  Left voice message  Alyssa Ford [facility]  [facility] Population Health Community Resource Care Guide   [email or web address]  ?? [phone]   Website: [email or web address]  [email or web address]

## 2023-01-15 ENCOUNTER — Other Ambulatory Visit (INDEPENDENT_AMBULATORY_CARE_PROVIDER_SITE_OTHER): Payer: Medicare Other

## 2023-01-15 ENCOUNTER — Ambulatory Visit (INDEPENDENT_AMBULATORY_CARE_PROVIDER_SITE_OTHER): Payer: Medicare Other | Admitting: Family Medicine

## 2023-01-15 ENCOUNTER — Encounter: Payer: Self-pay | Admitting: Family Medicine

## 2023-01-15 VITALS — BP 120/60 | HR 101 | Temp 98.1°F | Resp 18 | Ht 66.0 in | Wt 146.2 lb

## 2023-01-15 DIAGNOSIS — R110 Nausea: Secondary | ICD-10-CM

## 2023-01-15 DIAGNOSIS — J189 Pneumonia, unspecified organism: Secondary | ICD-10-CM

## 2023-01-15 DIAGNOSIS — D509 Iron deficiency anemia, unspecified: Secondary | ICD-10-CM | POA: Insufficient documentation

## 2023-01-15 LAB — CBC WITH DIFFERENTIAL/PLATELET
Basophils Absolute: 0 10*3/uL (ref 0.0–0.1)
Basophils Relative: 0.3 % (ref 0.0–3.0)
Eosinophils Absolute: 0.1 10*3/uL (ref 0.0–0.7)
Eosinophils Relative: 1.3 % (ref 0.0–5.0)
HCT: 33.6 % — ABNORMAL LOW (ref 36.0–46.0)
Hemoglobin: 11.2 g/dL — ABNORMAL LOW (ref 12.0–15.0)
Lymphocytes Relative: 17.7 % (ref 12.0–46.0)
Lymphs Abs: 1.7 10*3/uL (ref 0.7–4.0)
MCHC: 33.2 g/dL (ref 30.0–36.0)
MCV: 86.7 fl (ref 78.0–100.0)
Monocytes Absolute: 0.5 10*3/uL (ref 0.1–1.0)
Monocytes Relative: 5 % (ref 3.0–12.0)
Neutro Abs: 7.1 10*3/uL (ref 1.4–7.7)
Neutrophils Relative %: 75.7 % (ref 43.0–77.0)
Platelets: 267 10*3/uL (ref 150.0–400.0)
RBC: 3.88 Mil/uL (ref 3.87–5.11)
RDW: 15.5 % (ref 11.5–15.5)
WBC: 9.4 10*3/uL (ref 4.0–10.5)

## 2023-01-15 LAB — IBC + FERRITIN
Ferritin: 94.9 ng/mL (ref 10.0–291.0)
Iron: 101 ug/dL (ref 42–145)
Saturation Ratios: 23.2 % (ref 20.0–50.0)
TIBC: 435.4 ug/dL (ref 250.0–450.0)
Transferrin: 311 mg/dL (ref 212.0–360.0)

## 2023-01-15 NOTE — Assessment & Plan Note (Signed)
Resoved --- ct done in ER

## 2023-01-15 NOTE — Assessment & Plan Note (Signed)
Recheck labs today. 

## 2023-01-15 NOTE — Assessment & Plan Note (Signed)
Resolved Er visit, labs and imaging reviewed

## 2023-01-15 NOTE — Progress Notes (Signed)
Established Patient Office Visit  Subjective   Patient ID: Alyssa Ford, female    DOB: 01/21/1951  Age: 72 y.o. MRN: [id number]  Chief Complaint  Patient presents with   ED follow up    HPI Discussed the use of AI scribe software for clinical note transcription with the patient, who gave verbal consent to proceed.  History of Present Illness   The patient presents with a recent episode of severe nausea that lasted from Tuesday through Friday. The nausea was so severe that she sought emergency care. She was given a prescription medication by a NP named Vernona Rieger, which unfortunately exacerbated the nausea. In the emergency room, she was given the same medication again, despite expressing her concerns. The ER doctor suggested constipation as a potential cause, which the patient acknowledged but did not believe was severe enough to cause such symptoms. The patient also mentions feeling depressed recently, but states she is feeling better at the time of the visit.  In addition to these issues, the patient was recently diagnosed with anemia and pneumonia. She reports that the pneumonia has resolved. She also mentions a recent CT scan performed in the emergency room, but does not elaborate on the findings.      Patient Active Problem List   Diagnosis Date Noted   Nausea 01/15/2023   Iron deficiency anemia 01/15/2023   Hypokalemia 12/14/2022   Community acquired pneumonia of left lower lobe of lung 11/26/2022   Septic shock (HCC) 11/26/2022   Slow transit constipation 08/18/2022   Preventative health care 02/05/2022   Pulmonary HTN (HCC) 07/04/2021   Hyperlipidemia associated with type 2 diabetes mellitus (HCC) 07/04/2021   Atherosclerosis of aorta (HCC) 07/04/2021   Angioma of skin 06/02/2021   Vaginal itching 05/23/2021   Hematuria 05/23/2021   Insomnia 05/13/2021   Hypothyroidism 08/24/2020   Chest pain 08/06/2020   Trichimoniasis 04/27/2020   Cervical cancer screening 04/27/2020    Left thyroid nodule 04/13/2020   Vaginal odor 01/11/2020   Icterus 01/11/2020   Uncontrolled type 2 diabetes mellitus with hyperglycemia (HCC) 09/22/2019   De Quervain's disease (tenosynovitis) 09/22/2019   Neck mass 07/11/2018   Abnormal WBC count 05/29/2017   Skin fissure 09/24/2015   Allergic rhinitis 03/26/2014   Wheezing 03/26/2014   DM (diabetes mellitus) type II uncontrolled, periph vascular disorder 08/25/2011   Headache(784.0) 08/21/2011   Acute sinusitis 04/26/2010   URI 04/26/2010   UNSPECIFIED CATARACT 02/23/2010   POSTMENOPAUSAL STATUS 12/12/2007   FATIGUE 09/11/2007   THYROID NODULE 11/22/2006   Diabetes (HCC) 11/22/2006   Hyperlipidemia LDL goal <70 11/22/2006   GLUCOMA 11/22/2006   Essential hypertension 11/22/2006   GERD 11/22/2006   Past Medical History:  Diagnosis Date   Anemia    Anxiety    Arthritis    Cataract    Chronic headaches    Depression    Diabetes mellitus    Diverticulosis of colon (without mention of hemorrhage) 2003, 2008   Colonoscopy   Eczema    GERD (gastroesophageal reflux disease)    Glaucoma    Hiatal hernia 2003   EGD    Hyperlipidemia    Hypertension    Thyroid nodule    Past Surgical History:  Procedure Laterality Date   CARPAL TUNNEL RELEASE  2006   CARPAL TUNNEL RELEASE  12/24/2007   CATARACT EXTRACTION, BILATERAL     [facility]---  sept and oct 2018   CERVICAL FUSION  3/08   CHOLECYSTECTOMY     FASCIECTOMY Left  08/01/2018   Procedure: FASCIECTOMY LEFT RING;  Surgeon: Cindee Salt, MD;  Location: [facility];  Service: Orthopedics;  Laterality: Left;  UPPER ARM   TRIGGER FINGER RELEASE Left 08/01/2018   Procedure: RELEASE TRIGGER FINGER/A-1 PULLEY LEFTRING;  Surgeon: Cindee Salt, MD;  Location: [facility];  Service: Orthopedics;  Laterality: Left;   Social History   Tobacco Use   Smoking status: Former    Current packs/day: 0.00    Average packs/day: 1.4 packs/day for 49.0 years (68.6 ttl  pk-yrs)    Types: Cigarettes    Start date: 07/15/1966    Quit date: 07/16/2015    Years since quitting: 7.5    Passive exposure: Past   Smokeless tobacco: Never   Tobacco comments:    Continues to vape but states pod do not contain nicotine  Vaping Use   Vaping status: Former  Substance Use Topics   Alcohol use: Yes    Alcohol/week: 0.0 standard drinks of alcohol    Comment: once weekly; wine and beer   Drug use: No   Social History   Socioeconomic History   Marital status: Married    Spouse name: Not on file   Number of children: Not on file   Years of education: Not on file   Highest education level: 12th grade  Occupational History   Occupation: retired  Tobacco Use   Smoking status: Former    Current packs/day: 0.00    Average packs/day: 1.4 packs/day for 49.0 years (68.6 ttl pk-yrs)    Types: Cigarettes    Start date: 07/15/1966    Quit date: 07/16/2015    Years since quitting: 7.5    Passive exposure: Past   Smokeless tobacco: Never   Tobacco comments:    Continues to vape but states pod do not contain nicotine  Vaping Use   Vaping status: Former  Substance and Sexual Activity   Alcohol use: Yes    Alcohol/week: 0.0 standard drinks of alcohol    Comment: once weekly; wine and beer   Drug use: No   Sexual activity: Not Currently    Partners: Male  Other Topics Concern   Not on file  Social History Narrative   Exercise--  walk   Social Determinants of Health   Financial Resource Strain: Medium Risk (12/23/2021)   Overall Financial Resource Strain (CARDIA)    Difficulty of Paying Living Expenses: Somewhat hard  Food Insecurity: No Food Insecurity (12/04/2022)   Hunger Vital Sign    Worried About Running Out of Food in the Last Year: Never true    Ran Out of Food in the Last Year: Never true  Transportation Needs: No Transportation Needs (12/04/2022)   PRAPARE - Administrator, Civil Service (Medical): No    Lack of Transportation (Non-Medical):  No  Physical Activity: Insufficiently Active (07/03/2022)   Exercise Vital Sign    Days of Exercise per Week: 4 days    Minutes of Exercise per Session: 30 min  Stress: No Stress Concern Present (07/03/2022)   [employer] of Occupational Health - Occupational Stress Questionnaire    Feeling of Stress : Only a little  Social Connections: Moderately Isolated (07/03/2022)   Social Connection and Isolation Panel [NHANES]    Frequency of Communication with Friends and Family: More than three times a week    Frequency of Social Gatherings with Friends and Family: Twice a week    Attends Religious Services: Never    Active Member of  Clubs or Organizations: No    Attends Banker Meetings: Not on file    Marital Status: Married  Intimate Partner Violence: Not At Risk (11/26/2022)   Humiliation, Afraid, Rape, and Kick questionnaire    Fear of Current or Ex-Partner: No    Emotionally Abused: No    Physically Abused: No    Sexually Abused: No   Family Status  Relation Name Status   Mother  Deceased   Father  Deceased   Sister  Alive   Sister  Alive   Brother  Deceased at age 37       MI   Mat Aunt  Deceased at age 48   Cousin  Alive   Neg Hx  (Not Specified)  No partnership data on file   Family History  Problem Relation Age of Onset   Angioedema Mother    Dementia Mother    Breast cancer Mother 86   Kidney failure Mother 61       died from kidney failure from abx given to her for pneumonia   Hyperlipidemia Mother    Hypertension Mother    Diabetes Mother    Cancer Mother 40       breast   Parkinsonism Father    Diabetes Father 76   Hypertension Sister    Diabetes Brother    Hypertension Brother    Heart attack Brother    Heart disease Cousin 67       mi   Colon cancer Neg Hx    Esophageal cancer Neg Hx    Stomach cancer Neg Hx    Allergies  Allergen Reactions   Codeine Nausea Only    Severe nausea      Review of Systems  Constitutional:  Negative  for fever and malaise/fatigue.  HENT:  Negative for congestion.   Eyes:  Negative for blurred vision.  Respiratory:  Negative for cough and shortness of breath.   Cardiovascular:  Negative for chest pain, palpitations and leg swelling.  Gastrointestinal:  Negative for abdominal pain, blood in stool, nausea and vomiting.  Genitourinary:  Negative for dysuria and frequency.  Musculoskeletal:  Negative for back pain and falls.  Skin:  Negative for rash.  Neurological:  Negative for dizziness, loss of consciousness and headaches.  Endo/Heme/Allergies:  Negative for environmental allergies.  Psychiatric/Behavioral:  Negative for depression. The patient is not nervous/anxious.       Objective:     BP 120/60 (BP Location: Left Arm, Patient Position: Sitting, Cuff Size: Normal)   Pulse (!) 101   Temp 98.1 F (36.7 C) (Oral)   Resp 18   Ht 5\' 6"  (1.676 m)   Wt 146 lb 3.2 oz (66.3 kg)   SpO2 98%   BMI 23.60 kg/m  BP Readings from Last 3 Encounters:  01/15/23 120/60  12/29/22 (!) 175/85  12/29/22 (!) 142/80   Wt Readings from Last 3 Encounters:  01/15/23 146 lb 3.2 oz (66.3 kg)  12/29/22 143 lb 9.6 oz (65.1 kg)  12/14/22 143 lb 12.8 oz (65.2 kg)   SpO2 Readings from Last 3 Encounters:  01/15/23 98%  12/29/22 96%  12/29/22 98%      Physical Exam Vitals and nursing note reviewed.  Constitutional:      General: She is not in acute distress.    Appearance: Normal appearance. She is well-developed.  HENT:     Head: Normocephalic and atraumatic.  Eyes:     General: No scleral icterus.  Right eye: No discharge.        Left eye: No discharge.  Cardiovascular:     Rate and Rhythm: Normal rate and regular rhythm.     Heart sounds: No murmur heard. Pulmonary:     Effort: Pulmonary effort is normal. No respiratory distress.     Breath sounds: Normal breath sounds.  Musculoskeletal:        General: Normal range of motion.     Cervical back: Normal range of motion and neck  supple.     Right lower leg: No edema.     Left lower leg: No edema.  Skin:    General: Skin is warm and dry.  Neurological:     Mental Status: She is alert and oriented to person, place, and time.  Psychiatric:        Mood and Affect: Mood normal.        Behavior: Behavior normal.        Thought Content: Thought content normal.        Judgment: Judgment normal.      No results found for any visits on 01/15/23.  Last CBC Lab Results  Component Value Date   WBC 9.6 12/29/2022   HGB 11.0 (L) 12/29/2022   HCT 33.3 (L) 12/29/2022   MCV 86.5 12/29/2022   MCH 28.6 12/29/2022   RDW 14.3 12/29/2022   PLT 262 12/29/2022   Last metabolic panel Lab Results  Component Value Date   GLUCOSE 101 (H) 12/29/2022   NA 138 12/29/2022   K 3.7 12/29/2022   CL 101 12/29/2022   CO2 23 12/29/2022   BUN 15 12/29/2022   CREATININE 0.96 12/29/2022   GFRNONAA >60 12/29/2022   CALCIUM 7.3 (L) 12/29/2022   PHOS 8.7 (H) 11/27/2022   PROT 7.4 12/29/2022   ALBUMIN 4.4 12/29/2022   BILITOT 0.4 12/29/2022   ALKPHOS 40 12/29/2022   AST 18 12/29/2022   ALT 13 12/29/2022   ANIONGAP 14 12/29/2022   Last lipids Lab Results  Component Value Date   CHOL 154 08/18/2022   HDL 44.70 08/18/2022   LDLCALC 81 08/18/2022   LDLDIRECT 87.0 06/07/2020   TRIG 144.0 08/18/2022   CHOLHDL 3 08/18/2022   Last hemoglobin A1c Lab Results  Component Value Date   HGBA1C 6.5 10/19/2022   Last thyroid functions Lab Results  Component Value Date   TSH 3.04 10/19/2022   Last vitamin D Lab Results  Component Value Date   VD25OH 43 02/23/2010   Last vitamin B12 and Folate Lab Results  Component Value Date   VITAMINB12 1,337 (H) 04/17/2019   FOLATE 6.7 09/11/2007      The 10-year ASCVD risk score (Arnett DK, et al., 2019) is: 24.2%    Assessment & Plan:   Problem List Items Addressed This Visit       Unprioritized   Nausea - Primary    Resolved Er visit, labs and imaging reviewed        Iron deficiency anemia    Recheck labs today      Community acquired pneumonia of left lower lobe of lung    Resoved --- ct done in ER       Assessment and Plan    Nausea: Resolved. Recent ER visit for severe nausea. CT scan and urinalysis were normal. No current abdominal pain or nausea. -No further action required at this time.  Anemia: Hemoglobin was down on recent labs. No reported dark stools. -Check hemoglobin levels today.  Pneumonia:  Resolved. -No further action required at this time.  Depression: Patient reported feeling depressed but is currently feeling better. -No further action required at this time.  Follow-up in August 2024.       No follow-ups on file.    Donato Schultz, DO

## 2023-01-16 LAB — URINE CULTURE
MICRO NUMBER:: 15229045
SPECIMEN QUALITY:: ADEQUATE

## 2023-01-24 ENCOUNTER — Encounter (INDEPENDENT_AMBULATORY_CARE_PROVIDER_SITE_OTHER): Payer: Self-pay

## 2023-01-26 NOTE — Progress Notes (Signed)
Error. Duplicate encounter.

## 2023-01-30 ENCOUNTER — Ambulatory Visit: Admission: RE | Admit: 2023-01-30 | Payer: Medicare Other | Source: Ambulatory Visit

## 2023-01-30 DIAGNOSIS — E079 Disorder of thyroid, unspecified: Secondary | ICD-10-CM | POA: Diagnosis not present

## 2023-01-30 DIAGNOSIS — E2839 Other primary ovarian failure: Secondary | ICD-10-CM

## 2023-01-30 DIAGNOSIS — E349 Endocrine disorder, unspecified: Secondary | ICD-10-CM | POA: Diagnosis not present

## 2023-02-08 ENCOUNTER — Ambulatory Visit (INDEPENDENT_AMBULATORY_CARE_PROVIDER_SITE_OTHER): Payer: Medicare Other | Admitting: *Deleted

## 2023-02-08 VITALS — BP 118/69 | HR 78 | Ht 66.0 in | Wt 145.5 lb

## 2023-02-08 DIAGNOSIS — Z0000 Encounter for general adult medical examination without abnormal findings: Secondary | ICD-10-CM | POA: Diagnosis not present

## 2023-02-08 NOTE — Progress Notes (Signed)
Subjective:   Alyssa Ford is a 72 y.o. female who presents for Medicare Annual (Subsequent) preventive examination.  Visit Complete: In person  Patient Medicare AWV questionnaire was completed by the patient on 02/05/23; I have confirmed that all information answered by patient is correct and no changes since this date.  Review of Systems     Cardiac Risk Factors include: advanced age (>110men, >66 women);diabetes mellitus;dyslipidemia;hypertension     Objective:    Today's Vitals   02/08/23 1300  BP: 118/69  Pulse: 78  Weight: 145 lb 8 oz (66 kg)  Height: 5\' 6"  (1.676 m)   Body mass index is 23.48 kg/m.     02/08/2023    1:04 PM 12/29/2022    5:09 PM 11/26/2022   12:42 PM 02/06/2022    1:06 PM 05/11/2021    7:34 AM 04/30/2021    6:19 PM 01/18/2021    9:01 AM  Advanced Directives  Does Patient Have a Medical Advance Directive? No No No No No No No  Would patient like information on creating a medical advance directive? No - Patient declined No - Patient declined No - Patient declined No - Patient declined No - Patient declined  Yes (MAU/Ambulatory/Procedural Areas - Information given)    Current Medications (verified) Outpatient Encounter Medications as of 02/08/2023  Medication Sig   amitriptyline (ELAVIL) 25 MG tablet TAKE 2 TABLETS BY MOUTH DAILY AT 12 PM NOON   aspirin EC 81 MG tablet Take 81 mg by mouth daily.   BIOTIN PO Take 1 tablet by mouth daily.    Coenzyme Q10 (CO Q 10 PO) Take 1 capsule by mouth daily.   fenofibrate 160 MG tablet TAKE 1 TABLET(160 MG) BY MOUTH DAILY   Ferrous Sulfate (IRON SUPPLEMENT PO) Take 1 tablet by mouth every other day. 65mg    fluticasone (FLONASE) 50 MCG/ACT nasal spray Place 2 sprays into both nostrils daily. (Patient taking differently: Place 2 sprays into both nostrils daily as needed for allergies.)   ipratropium (ATROVENT) 0.06 % nasal spray Place 2 sprays into both nostrils 3 (three) times daily as needed for rhinitis.    latanoprost (XALATAN) 0.005 % ophthalmic solution Place 1 drop into both eyes at bedtime.    lisinopril (ZESTRIL) 5 MG tablet TAKE 1 TABLET(5 MG) BY MOUTH DAILY   metFORMIN (GLUCOPHAGE-XR) 500 MG 24 hr tablet Take 4 pills every day   Naproxen Sodium (ALEVE PO) Take 2 tablets by mouth daily as needed (pain).   NONFORMULARY OR COMPOUNDED ITEM Cbd /delta 9 1 po qhs (Patient taking differently: Take 1 tablet by mouth at bedtime. Cbd /delta 9 1 po qhs)   omeprazole (PRILOSEC) 20 MG capsule Take 1 capsule (20 mg total) by mouth daily.   ondansetron (ZOFRAN-ODT) 8 MG disintegrating tablet Take 1 tablet (8 mg total) by mouth every 8 (eight) hours as needed for nausea or vomiting.   repaglinide (PRANDIN) 1 MG tablet Take 2 tablets (2 mg total) by mouth daily. Before evening meal.   rosuvastatin (CRESTOR) 40 MG tablet Take 1 tablet (40 mg total) by mouth at bedtime.   Semaglutide (RYBELSUS) 3 MG TABS 1 tablet daily (Patient taking differently: Take 1 tablet by mouth daily.)   [DISCONTINUED] azelastine (ASTELIN) 0.1 % nasal spray Place 1 spray into both nostrils 2 (two) times daily. Use in each nostril as directed (Patient taking differently: Place 1 spray into both nostrils 2 (two) times daily as needed for allergies. Use in each nostril as directed)   [  DISCONTINUED] levocetirizine (XYZAL) 5 MG tablet Take 1 tablet (5 mg total) by mouth every evening.   [DISCONTINUED] ondansetron (ZOFRAN-ODT) 4 MG disintegrating tablet Take 1 tablet (4 mg total) by mouth every 8 (eight) hours as needed for up to 14 doses for nausea or vomiting.   No facility-administered encounter medications on file as of 02/08/2023.    Allergies (verified) Codeine   History: Past Medical History:  Diagnosis Date   Anemia    Anxiety    Arthritis    Cataract    Chronic headaches    Depression    Diabetes mellitus    Diverticulosis of colon (without mention of hemorrhage) 2003, 2008   Colonoscopy   Eczema    Emphysema of lung  (HCC)    GERD (gastroesophageal reflux disease)    Glaucoma    Hiatal hernia 2003   EGD    Hyperlipidemia    Hypertension    Thyroid nodule    Past Surgical History:  Procedure Laterality Date   CARPAL TUNNEL RELEASE  2006   CARPAL TUNNEL RELEASE  12/24/2007   CATARACT EXTRACTION, BILATERAL     [facility]---  sept and oct 2018   CERVICAL FUSION  08/2006   CHOLECYSTECTOMY     COSMETIC SURGERY  December 09 2018   EYE SURGERY  catracts   FASCIECTOMY Left 08/01/2018   Procedure: FASCIECTOMY LEFT RING;  Surgeon: Cindee Salt, MD;  Location: [facility];  Service: Orthopedics;  Laterality: Left;  UPPER ARM   SPINE SURGERY  neck dic replaced   TRIGGER FINGER RELEASE Left 08/01/2018   Procedure: RELEASE TRIGGER FINGER/A-1 PULLEY LEFTRING;  Surgeon: Cindee Salt, MD;  Location: [facility];  Service: Orthopedics;  Laterality: Left;   TUBAL LIGATION     Family History  Problem Relation Age of Onset   Angioedema Mother    Dementia Mother    Breast cancer Mother 36   Kidney failure Mother 92       died from kidney failure from abx given to her for pneumonia   Hyperlipidemia Mother    Hypertension Mother    Diabetes Mother    Cancer Mother 85       breast   Parkinsonism Father    Diabetes Father 54   Hypertension Sister    Diabetes Brother    Hypertension Brother    Heart attack Brother    Heart disease Cousin 11       mi   Colon cancer Neg Hx    Esophageal cancer Neg Hx    Stomach cancer Neg Hx    Social History   Socioeconomic History   Marital status: Married    Spouse name: Not on file   Number of children: Not on file   Years of education: Not on file   Highest education level: 12th grade  Occupational History   Occupation: retired  Tobacco Use   Smoking status: Former    Current packs/day: 0.00    Average packs/day: 1.4 packs/day for 49.0 years (68.6 ttl pk-yrs)    Types: Cigarettes    Start date: 07/15/1966    Quit date: 07/16/2015    Years  since quitting: 7.5    Passive exposure: Past   Smokeless tobacco: Never   Tobacco comments:    Continues to vape but states pod do not contain nicotine  Vaping Use   Vaping status: Former  Substance and Sexual Activity   Alcohol use: Yes    Alcohol/week: 0.0 standard drinks  of alcohol    Comment: once weekly; wine and beer   Drug use: No   Sexual activity: Not Currently    Partners: Male  Other Topics Concern   Not on file  Social History Narrative   Exercise--  walk   Social Determinants of Health   Financial Resource Strain: Low Risk  (02/05/2023)   Overall Financial Resource Strain (CARDIA)    Difficulty of Paying Living Expenses: Not very hard  Food Insecurity: No Food Insecurity (02/05/2023)   Hunger Vital Sign    Worried About Running Out of Food in the Last Year: Never true    Ran Out of Food in the Last Year: Never true  Transportation Needs: No Transportation Needs (02/05/2023)   PRAPARE - Administrator, Civil Service (Medical): No    Lack of Transportation (Non-Medical): No  Physical Activity: Insufficiently Active (02/05/2023)   Exercise Vital Sign    Days of Exercise per Week: 4 days    Minutes of Exercise per Session: 30 min  Stress: No Stress Concern Present (02/05/2023)   [employer] of Occupational Health - Occupational Stress Questionnaire    Feeling of Stress : Only a little  Social Connections: Moderately Isolated (02/05/2023)   Social Connection and Isolation Panel [NHANES]    Frequency of Communication with Friends and Family: More than three times a week    Frequency of Social Gatherings with Friends and Family: Three times a week    Attends Religious Services: Never    Active Member of Clubs or Organizations: No    Attends Banker Meetings: Never    Marital Status: Married    Tobacco Counseling Counseling given: Not Answered Tobacco comments: Continues to vape but states pod do not contain nicotine   Clinical  Intake:  Pre-visit preparation completed: Yes  Pain : No/denies pain  Nutritional Risks: None Diabetes: Yes CBG done?: No Did pt. bring in CBG monitor from home?: No  How often do you need to have someone help you when you read instructions, pamphlets, or other written materials from your doctor or pharmacy?: 1 - Never  Interpreter Needed?: No  Information entered by ::  ,CMA   Activities of Daily Living    02/05/2023    5:13 PM 11/26/2022    6:00 PM  In your present state of health, do you have any difficulty performing the following activities:  Hearing? 0   Vision? 0   Difficulty concentrating or making decisions? 0   Walking or climbing stairs? 0   Dressing or bathing? 0   Doing errands, shopping? 0 0  Preparing Food and eating ? N   Using the Toilet? N   In the past six months, have you accidently leaked urine? Y   Do you have problems with loss of bowel control? N   Managing your Medications? N   Managing your Finances? N   Housekeeping or managing your Housekeeping? N     Patient Care Team: [name], Grayling Congress, DO as PCP - General Nelson Chimes, MD as Consulting Physician (Ophthalmology) Elesa Hacker, MD as Referring Physician (Dermatology) Verne Spurr, Alita Chyle, MD as Referring Physician (Pediatrics) Henrene Pastor, RPH-CPP (Pharmacist) Reather Littler, MD as Consulting Physician (Endocrinology)  Indicate any recent Medical Services you may have received from other than [facility] providers in the past year (date may be approximate).     Assessment:   This is a routine wellness examination for Akane.  Hearing/Vision screen No results found.  Dietary issues and exercise activities discussed:     Goals Addressed   None    Depression Screen    02/08/2023    1:06 PM 02/06/2022    1:07 PM 01/31/2022    1:16 PM 01/18/2021    9:04 AM 01/06/2020    2:48 PM 03/05/2019    3:43 PM 05/29/2017   10:49 AM  PHQ 2/9 Scores  PHQ - 2 Score 1 0  0 0 0 0 0  PHQ- 9 Score      0 0    Fall Risk    02/05/2023    5:13 PM 07/03/2022    7:24 AM 02/06/2022    1:07 PM 01/31/2022    1:15 PM 01/18/2021    9:03 AM  Fall Risk   Falls in the past year? 0 0 0 0 0  Number falls in past yr: 0 0 0 0 0  Injury with Fall? 0 0 0 0 0  Risk for fall due to : No Fall Risks  No Fall Risks    Follow up Falls evaluation completed  Falls evaluation completed Falls evaluation completed Falls prevention discussed    MEDICARE RISK AT HOME:   TIMED UP AND GO:  Was the test performed?  Yes  Length of time to ambulate 10 feet: 7 sec Gait steady and fast without use of assistive device    Cognitive Function:    12/14/2016    9:20 AM  MMSE - Mini Mental State Exam  Orientation to time 5  Orientation to Place 5  Registration 3  Attention/ Calculation 5  Recall 3  Language- name 2 objects 2  Language- repeat 1  Language- follow 3 step command 3  Language- read & follow direction 1  Write a sentence 1  Copy design 1  Total score 30        02/08/2023    1:08 PM 02/06/2022    1:15 PM 01/18/2021    9:13 AM 01/06/2020    2:46 PM  6CIT Screen  What Year? 0 points 0 points 0 points 0 points  What month? 0 points 0 points 0 points 0 points  What time? 0 points 0 points 0 points 0 points  Count back from 20 0 points 0 points 0 points 0 points  Months in reverse 0 points 0 points 0 points 0 points  Repeat phrase 6 points 2 points 0 points   Total Score 6 points 2 points 0 points     Immunizations Immunization History  Administered Date(s) Administered   Fluad Quad(high Dose 65+) 04/08/2019, 04/08/2020   Influenza Split 04/13/2011   Influenza Whole 04/17/2006   Influenza, High Dose Seasonal PF 04/12/2016, 05/07/2017, 04/08/2018, 03/23/2022   Influenza, Seasonal, Injecte, Preservative Fre 05/22/2012   Influenza,inj,Quad PF,6+ Mos 03/03/2014, 03/18/2015   Influenza-Unspecified 05/07/2017, 05/02/2021   PFIZER(Purple Top)SARS-COV-2 Vaccination  07/21/2019, 08/11/2019, 04/27/2020, 11/23/2020   Pfizer Covid-19 Vaccine Bivalent Booster 51yrs & up 05/03/2021   Pneumococcal Conjugate-13 09/13/2015, 05/07/2017, 04/15/2019   Pneumococcal Polysaccharide-23 03/31/2003, 11/03/2008, 05/07/2017   Respiratory Syncytial Virus Vaccine,Recomb Aduvanted(Arexvy) 06/02/2022   Td 10/24/1999, 02/23/2010   Tdap 02/21/2019   Unspecified SARS-COV-2 Vaccination 03/23/2022   Zoster Recombinant(Shingrix) 03/09/2017, 06/23/2017   Zoster, Live 12/25/2011    TDAP status: Up to date  Flu Vaccine status: Due, Education has been provided regarding the importance of this vaccine. Advised may receive this vaccine at local pharmacy or Health Dept. Aware to provide a copy of the vaccination record if obtained from  local pharmacy or Health Dept. Verbalized acceptance and understanding.  Pneumococcal vaccine status: Up to date  Covid-19 vaccine status: Information provided on how to obtain vaccines.   Qualifies for Shingles Vaccine? Yes   Zostavax completed Yes   Shingrix Completed?: Yes  Screening Tests Health Maintenance  Topic Date Due   COVID-19 Vaccine (7 - 2023-24 season) 05/18/2022   Diabetic kidney evaluation - Urine ACR  02/02/2023   Medicare Annual Wellness (AWV)  02/07/2023   INFLUENZA VACCINE  01/25/2023   FOOT EXAM  03/24/2023   HEMOGLOBIN A1C  04/20/2023   OPHTHALMOLOGY EXAM  08/30/2023   Lung Cancer Screening  09/07/2023   MAMMOGRAM  11/13/2023   Diabetic kidney evaluation - eGFR measurement  12/29/2023   Colonoscopy  05/11/2024   DEXA SCAN  01/29/2025   DTaP/Tdap/Td (4 - Td or Tdap) 02/20/2029   Pneumonia Vaccine 79+ Years old  Completed   Hepatitis C Screening  Completed   Zoster Vaccines- Shingrix  Completed   HPV VACCINES  Aged Out    Health Maintenance  Health Maintenance Due  Topic Date Due   COVID-19 Vaccine (7 - 2023-24 season) 05/18/2022   Diabetic kidney evaluation - Urine ACR  02/02/2023   Medicare Annual Wellness  (AWV)  02/07/2023   INFLUENZA VACCINE  01/25/2023    Colorectal cancer screening: Type of screening: Colonoscopy. Completed 05/11/21. Repeat every 3 years  Mammogram status: Completed 11/13/22. Repeat every year  Bone Density status: Completed 01/30/23. Results reflect: Bone density results: NORMAL. Repeat every 2 years.  Lung Cancer Screening: (Low Dose CT Chest recommended if Age 62-80 years, 20 pack-year currently smoking OR have quit w/in 15years.) does qualify.   Lung Cancer Screening Referral: next scan scheduled for 03/14/23  Additional Screening:  Hepatitis C Screening: does qualify; Completed 04/27/15  Vision Screening: Recommended annual ophthalmology exams for early detection of glaucoma and other disorders of the eye. Is the patient up to date with their annual eye exam?  Yes  Who is the provider or what is the name of the office in which the patient attends annual eye exams? [name]. If pt is not established with a provider, would they like to be referred to a provider to establish care? No .   Dental Screening: Recommended annual dental exams for proper oral hygiene  Diabetic Foot Exam: Diabetic Foot Exam: Completed 03/23/22  Community Resource Referral / Chronic Care Management: CRR required this visit?  No   CCM required this visit?  No     Plan:     I have personally reviewed and noted the following in the patient's chart:   Medical and social history Use of alcohol, tobacco or illicit drugs  Current medications and supplements including opioid prescriptions. Patient is not currently taking opioid prescriptions. Functional ability and status Nutritional status Physical activity Advanced directives List of other physicians Hospitalizations, surgeries, and ER visits in previous 12 months Vitals Screenings to include cognitive, depression, and falls Referrals and appointments  In addition, I have reviewed and discussed with patient certain  preventive protocols, quality metrics, and best practice recommendations. A written personalized care plan for preventive services as well as general preventive health recommendations were provided to patient.     Donne Anon, CMA   02/08/2023   After Visit Summary: printed for pt  Nurse Notes: None

## 2023-02-08 NOTE — Patient Instructions (Signed)
[name] , Thank you for taking time to come for your Medicare Wellness Visit. I appreciate your ongoing commitment to your health goals. Please review the following plan we discussed and let me know if I can assist you in the future.      This is a list of the screening recommended for you and due dates:  Health Maintenance  Topic Date Due   COVID-19 Vaccine (7 - 2023-24 season) 05/18/2022   Yearly kidney health urinalysis for diabetes  02/02/2023   Flu Shot  01/25/2023   Complete foot exam   03/24/2023   Hemoglobin A1C  04/20/2023   Eye exam for diabetics  08/30/2023   Screening for Lung Cancer  09/07/2023   Mammogram  11/13/2023   Yearly kidney function blood test for diabetes  12/29/2023   Medicare Annual Wellness Visit  02/08/2024   Colon Cancer Screening  05/11/2024   DEXA scan (bone density measurement)  01/29/2025   DTaP/Tdap/Td vaccine (4 - Td or Tdap) 02/20/2029   Pneumonia Vaccine  Completed   Hepatitis C Screening  Completed   Zoster (Shingles) Vaccine  Completed   HPV Vaccine  Aged Out    Next appointment: Follow up in one year for your annual wellness visit.   Preventive Care 70 Years and Older, Female Preventive care refers to lifestyle choices and visits with your health care provider that can promote health and wellness. What does preventive care include? A yearly physical exam. This is also called an annual well check. Dental exams once or twice a year. Routine eye exams. Ask your health care provider how often you should have your eyes checked. Personal lifestyle choices, including: Daily care of your teeth and gums. Regular physical activity. Eating a healthy diet. Avoiding tobacco and drug use. Limiting alcohol use. Practicing safe sex. Taking low-dose aspirin every day. Taking vitamin and mineral supplements as recommended by your health care provider. What happens during an annual well check? The services and screenings done by your health  care provider during your annual well check will depend on your age, overall health, lifestyle risk factors, and family history of disease. Counseling  Your health care provider may ask you questions about your: Alcohol use. Tobacco use. Drug use. Emotional well-being. Home and relationship well-being. Sexual activity. Eating habits. History of falls. Memory and ability to understand (cognition). Work and work Astronomer. Reproductive health. Screening  You may have the following tests or measurements: Height, weight, and BMI. Blood pressure. Lipid and cholesterol levels. These may be checked every 5 years, or more frequently if you are over 55 years old. Skin check. Lung cancer screening. You may have this screening every year starting at age 8 if you have a 30-pack-year history of smoking and currently smoke or have quit within the past 15 years. Fecal occult blood test (FOBT) of the stool. You may have this test every year starting at age 71. Flexible sigmoidoscopy or colonoscopy. You may have a sigmoidoscopy every 5 years or a colonoscopy every 10 years starting at age 46. Hepatitis C blood test. Hepatitis B blood test. Sexually transmitted disease (STD) testing. Diabetes screening. This is done by checking your blood sugar (glucose) after you have not eaten for a while (fasting). You may have this done every 1-3 years. Bone density scan. This is done to screen for osteoporosis. You may have this done starting at age 60. Mammogram. This may be done every 1-2 years. Talk to your health care provider about how often  you should have regular mammograms. Talk with your health care provider about your test results, treatment options, and if necessary, the need for more tests. Vaccines  Your health care provider may recommend certain vaccines, such as: Influenza vaccine. This is recommended every year. Tetanus, diphtheria, and acellular pertussis (Tdap, Td) vaccine. You may need a Td  booster every 10 years. Zoster vaccine. You may need this after age 49. Pneumococcal 13-valent conjugate (PCV13) vaccine. One dose is recommended after age 69. Pneumococcal polysaccharide (PPSV23) vaccine. One dose is recommended after age 24. Talk to your health care provider about which screenings and vaccines you need and how often you need them. This information is not intended to replace advice given to you by your health care provider. Make sure you discuss any questions you have with your health care provider. Document Released: 07/09/2015 Document Revised: 03/01/2016 Document Reviewed: 04/13/2015 Elsevier Interactive Patient Education  2017 [employer].  Fall Prevention in the Home Falls can cause injuries. They can happen to people of all ages. There are many things you can do to make your home safe and to help prevent falls. What can I do on the outside of my home? Regularly fix the edges of walkways and driveways and fix any cracks. Remove anything that might make you trip as you walk through a door, such as a raised step or threshold. Trim any bushes or trees on the path to your home. Use bright outdoor lighting. Clear any walking paths of anything that might make someone trip, such as rocks or tools. Regularly check to see if handrails are loose or broken. Make sure that both sides of any steps have handrails. Any raised decks and porches should have guardrails on the edges. Have any leaves, snow, or ice cleared regularly. Use sand or salt on walking paths during winter. Clean up any spills in your garage right away. This includes oil or grease spills. What can I do in the bathroom? Use night lights. Install grab bars by the toilet and in the tub and shower. Do not use towel bars as grab bars. Use non-skid mats or decals in the tub or shower. If you need to sit down in the shower, use a plastic, non-slip stool. Keep the floor dry. Clean up any water that spills on the floor  as soon as it happens. Remove soap buildup in the tub or shower regularly. Attach bath mats securely with double-sided non-slip rug tape. Do not have throw rugs and other things on the floor that can make you trip. What can I do in the bedroom? Use night lights. Make sure that you have a light by your bed that is easy to reach. Do not use any sheets or blankets that are too big for your bed. They should not hang down onto the floor. Have a firm chair that has side arms. You can use this for support while you get dressed. Do not have throw rugs and other things on the floor that can make you trip. What can I do in the kitchen? Clean up any spills right away. Avoid walking on wet floors. Keep items that you use a lot in easy-to-reach places. If you need to reach something above you, use a strong step stool that has a grab bar. Keep electrical cords out of the way. Do not use floor polish or wax that makes floors slippery. If you must use wax, use non-skid floor wax. Do not have throw rugs and other things on  the floor that can make you trip. What can I do with my stairs? Do not leave any items on the stairs. Make sure that there are handrails on both sides of the stairs and use them. Fix handrails that are broken or loose. Make sure that handrails are as long as the stairways. Check any carpeting to make sure that it is firmly attached to the stairs. Fix any carpet that is loose or worn. Avoid having throw rugs at the top or bottom of the stairs. If you do have throw rugs, attach them to the floor with carpet tape. Make sure that you have a light switch at the top of the stairs and the bottom of the stairs. If you do not have them, ask someone to add them for you. What else can I do to help prevent falls? Wear shoes that: Do not have high heels. Have rubber bottoms. Are comfortable and fit you well. Are closed at the toe. Do not wear sandals. If you use a stepladder: Make sure that it is  fully opened. Do not climb a closed stepladder. Make sure that both sides of the stepladder are locked into place. Ask someone to hold it for you, if possible. Clearly mark and make sure that you can see: Any grab bars or handrails. First and last steps. Where the edge of each step is. Use tools that help you move around (mobility aids) if they are needed. These include: Canes. Walkers. Scooters. Crutches. Turn on the lights when you go into a dark area. Replace any light bulbs as soon as they burn out. Set up your furniture so you have a clear path. Avoid moving your furniture around. If any of your floors are uneven, fix them. If there are any pets around you, be aware of where they are. Review your medicines with your doctor. Some medicines can make you feel dizzy. This can increase your chance of falling. Ask your doctor what other things that you can do to help prevent falls. This information is not intended to replace advice given to you by your health care provider. Make sure you discuss any questions you have with your health care provider. Document Released: 04/08/2009 Document Revised: 11/18/2015 Document Reviewed: 07/17/2014 Elsevier Interactive Patient Education  2017 [employer].

## 2023-02-12 ENCOUNTER — Encounter: Payer: Self-pay | Admitting: Family Medicine

## 2023-02-12 ENCOUNTER — Ambulatory Visit (INDEPENDENT_AMBULATORY_CARE_PROVIDER_SITE_OTHER): Payer: Medicare Other | Admitting: Family Medicine

## 2023-02-12 VITALS — BP 110/70 | HR 77 | Temp 97.8°F | Resp 18 | Ht 66.0 in | Wt 144.4 lb

## 2023-02-12 DIAGNOSIS — E1169 Type 2 diabetes mellitus with other specified complication: Secondary | ICD-10-CM

## 2023-02-12 DIAGNOSIS — D509 Iron deficiency anemia, unspecified: Secondary | ICD-10-CM | POA: Diagnosis not present

## 2023-02-12 DIAGNOSIS — E1165 Type 2 diabetes mellitus with hyperglycemia: Secondary | ICD-10-CM | POA: Diagnosis not present

## 2023-02-12 DIAGNOSIS — Z7984 Long term (current) use of oral hypoglycemic drugs: Secondary | ICD-10-CM | POA: Diagnosis not present

## 2023-02-12 DIAGNOSIS — K219 Gastro-esophageal reflux disease without esophagitis: Secondary | ICD-10-CM

## 2023-02-12 DIAGNOSIS — I10 Essential (primary) hypertension: Secondary | ICD-10-CM | POA: Diagnosis not present

## 2023-02-12 DIAGNOSIS — E039 Hypothyroidism, unspecified: Secondary | ICD-10-CM | POA: Diagnosis not present

## 2023-02-12 DIAGNOSIS — E785 Hyperlipidemia, unspecified: Secondary | ICD-10-CM

## 2023-02-12 LAB — CBC WITH DIFFERENTIAL/PLATELET
Basophils Absolute: 0.1 10*3/uL (ref 0.0–0.1)
Basophils Relative: 0.9 % (ref 0.0–3.0)
Eosinophils Absolute: 0.2 10*3/uL (ref 0.0–0.7)
Eosinophils Relative: 2.6 % (ref 0.0–5.0)
HCT: 33.6 % — ABNORMAL LOW (ref 36.0–46.0)
Hemoglobin: 10.7 g/dL — ABNORMAL LOW (ref 12.0–15.0)
Lymphocytes Relative: 41.4 % (ref 12.0–46.0)
Lymphs Abs: 2.4 10*3/uL (ref 0.7–4.0)
MCHC: 31.9 g/dL (ref 30.0–36.0)
MCV: 87.6 fl (ref 78.0–100.0)
Monocytes Absolute: 0.4 10*3/uL (ref 0.1–1.0)
Monocytes Relative: 6.9 % (ref 3.0–12.0)
Neutro Abs: 2.9 10*3/uL (ref 1.4–7.7)
Neutrophils Relative %: 48.2 % (ref 43.0–77.0)
Platelets: 267 10*3/uL (ref 150.0–400.0)
RBC: 3.83 Mil/uL — ABNORMAL LOW (ref 3.87–5.11)
RDW: 14.9 % (ref 11.5–15.5)
WBC: 5.9 10*3/uL (ref 4.0–10.5)

## 2023-02-12 LAB — COMPREHENSIVE METABOLIC PANEL
ALT: 14 U/L (ref 0–35)
AST: 15 U/L (ref 0–37)
Albumin: 4.1 g/dL (ref 3.5–5.2)
Alkaline Phosphatase: 38 U/L — ABNORMAL LOW (ref 39–117)
BUN: 22 mg/dL (ref 6–23)
CO2: 27 mEq/L (ref 19–32)
Calcium: 9.3 mg/dL (ref 8.4–10.5)
Chloride: 102 mEq/L (ref 96–112)
Creatinine, Ser: 1.05 mg/dL (ref 0.40–1.20)
GFR: 52.94 mL/min — ABNORMAL LOW (ref >60.00)
Glucose, Bld: 127 mg/dL — ABNORMAL HIGH (ref 70–99)
Potassium: 4 mEq/L (ref 3.5–5.1)
Sodium: 140 mEq/L (ref 135–145)
Total Bilirubin: 0.3 mg/dL (ref 0.2–1.2)
Total Protein: 6.6 g/dL (ref 6.0–8.3)

## 2023-02-12 LAB — TSH: TSH: 2.9 u[IU]/mL (ref 0.35–5.50)

## 2023-02-12 LAB — LIPID PANEL
Cholesterol: 126 mg/dL (ref 0–200)
HDL: 43.5 mg/dL (ref >39.00)
LDL Cholesterol: 54 mg/dL (ref 0–99)
NonHDL: 82.7
Total CHOL/HDL Ratio: 3
Triglycerides: 142 mg/dL (ref 0.0–149.0)
VLDL: 28.4 mg/dL (ref 0.0–40.0)

## 2023-02-12 LAB — MICROALBUMIN / CREATININE URINE RATIO
Creatinine,U: 85.3 mg/dL
Microalb Creat Ratio: 2.7 mg/g (ref 0.0–30.0)
Microalb, Ur: 2.3 mg/dL — ABNORMAL HIGH (ref 0.0–1.9)

## 2023-02-12 LAB — HEMOGLOBIN A1C: Hgb A1c MFr Bld: 6.4 % (ref 4.6–6.5)

## 2023-02-12 MED ORDER — OMEPRAZOLE 20 MG PO CPDR: 20.0000 mg | DELAYED_RELEASE_CAPSULE | Freq: Every day | ORAL | 3 refills | Status: DC

## 2023-02-12 MED ORDER — ROSUVASTATIN CALCIUM 40 MG PO TABS: 40.0000 mg | ORAL_TABLET | Freq: Every day | ORAL | 1 refills | Status: DC

## 2023-02-12 MED ORDER — FENOFIBRATE 160 MG PO TABS: 160.0000 mg | ORAL_TABLET | Freq: Every day | ORAL | 1 refills | Status: DC

## 2023-02-12 NOTE — Progress Notes (Signed)
Established Patient Office Visit  Subjective   Patient ID: Alyssa Ford, female    DOB: [birth year]  Age: 72 y.o. MRN: [id number]  Chief Complaint  Patient presents with   Hyperlipidemia   Hypertension   Follow-up    HPI Discussed the use of AI scribe software for clinical note transcription with the patient, who gave verbal consent to proceed.  History of Present Illness   The patient, with a history of diabetes, hyperlipidemia, and gastroesophageal reflux disease, presents for a routine A1c check. She reports a recent fall during a hike due to muddy and wet conditions. The fall resulted in knee swelling, but she denies pain or difficulty walking. She also sustained a minor cut from a briar.  The patient's diabetes has been managed without medication. She has been checking her blood sugars at home, which have been ranging from 130 to 160 two hours after eating. She has been adhering to her medication regimen for hyperlipidemia and gastroesophageal reflux disease.      Patient Active Problem List   Diagnosis Date Noted   Nausea 01/15/2023   Iron deficiency anemia 01/15/2023   Hypokalemia 12/14/2022   Community acquired pneumonia of left lower lobe of lung 11/26/2022   Septic shock (HCC) 11/26/2022   Slow transit constipation 08/18/2022   Preventative health care 02/05/2022   Pulmonary HTN (HCC) 07/04/2021   Hyperlipidemia associated with type 2 diabetes mellitus (HCC) 07/04/2021   Atherosclerosis of aorta (HCC) 07/04/2021   Angioma of skin 06/02/2021   Vaginal itching 05/23/2021   Hematuria 05/23/2021   Insomnia 05/13/2021   Hypothyroidism 08/24/2020   Chest pain 08/06/2020   Trichimoniasis 04/27/2020   Cervical cancer screening 04/27/2020   Left thyroid nodule 04/13/2020   Vaginal odor 01/11/2020   Icterus 01/11/2020   Controlled type 2 diabetes mellitus with hyperglycemia, without long-term current use of insulin (HCC) 09/22/2019   De Quervain's disease  (tenosynovitis) 09/22/2019   Neck mass 07/11/2018   Abnormal WBC count 05/29/2017   Skin fissure 09/24/2015   Allergic rhinitis 03/26/2014   Wheezing 03/26/2014   DM (diabetes mellitus) type II uncontrolled, periph vascular disorder 08/25/2011   Headache(784.0) 08/21/2011   Acute sinusitis 04/26/2010   URI 04/26/2010   UNSPECIFIED CATARACT 02/23/2010   POSTMENOPAUSAL STATUS 12/12/2007   FATIGUE 09/11/2007   THYROID NODULE 11/22/2006   Diabetes (HCC) 11/22/2006   Hyperlipidemia LDL goal <70 11/22/2006   GLUCOMA 11/22/2006   Essential hypertension 11/22/2006   GERD 11/22/2006   Past Medical History:  Diagnosis Date   Anemia    Anxiety    Arthritis    Cataract    Chronic headaches    Depression    Diabetes mellitus    Diverticulosis of colon (without mention of hemorrhage) 2003, 2008   Colonoscopy   Eczema    Emphysema of lung (HCC)    GERD (gastroesophageal reflux disease)    Glaucoma    Hiatal hernia 2003   EGD    Hyperlipidemia    Hypertension    Thyroid nodule    Past Surgical History:  Procedure Laterality Date   CARPAL TUNNEL RELEASE  2006   CARPAL TUNNEL RELEASE  12/24/2007   CATARACT EXTRACTION, BILATERAL     [facility]---  sept and oct 2018   CERVICAL FUSION  08/2006   CHOLECYSTECTOMY     COSMETIC SURGERY  December 09 2018   EYE SURGERY  catracts   FASCIECTOMY Left 08/01/2018   Procedure: FASCIECTOMY LEFT RING;  Surgeon: Cindee Salt, MD;  Location: [facility];  Service: Orthopedics;  Laterality: Left;  UPPER ARM   SPINE SURGERY  neck dic replaced   TRIGGER FINGER RELEASE Left 08/01/2018   Procedure: RELEASE TRIGGER FINGER/A-1 PULLEY LEFTRING;  Surgeon: Cindee Salt, MD;  Location: [facility];  Service: Orthopedics;  Laterality: Left;   TUBAL LIGATION     Social History   Tobacco Use   Smoking status: Former    Current packs/day: 0.00    Average packs/day: 1.4 packs/day for 49.0 years (68.6 ttl pk-yrs)    Types: Cigarettes     Start date: 07/15/1966    Quit date: 07/16/2015    Years since quitting: 7.5    Passive exposure: Past   Smokeless tobacco: Never   Tobacco comments:    Continues to vape but states pod do not contain nicotine  Vaping Use   Vaping status: Former  Substance Use Topics   Alcohol use: Yes    Alcohol/week: 0.0 standard drinks of alcohol    Comment: once weekly; wine and beer   Drug use: No   Social History   Socioeconomic History   Marital status: Married    Spouse name: Not on file   Number of children: Not on file   Years of education: Not on file   Highest education level: 12th grade  Occupational History   Occupation: retired  Tobacco Use   Smoking status: Former    Current packs/day: 0.00    Average packs/day: 1.4 packs/day for 49.0 years (68.6 ttl pk-yrs)    Types: Cigarettes    Start date: 07/15/1966    Quit date: 07/16/2015    Years since quitting: 7.5    Passive exposure: Past   Smokeless tobacco: Never   Tobacco comments:    Continues to vape but states pod do not contain nicotine  Vaping Use   Vaping status: Former  Substance and Sexual Activity   Alcohol use: Yes    Alcohol/week: 0.0 standard drinks of alcohol    Comment: once weekly; wine and beer   Drug use: No   Sexual activity: Not Currently    Partners: Male  Other Topics Concern   Not on file  Social History Narrative   Exercise--  walk   Social Determinants of Health   Financial Resource Strain: Low Risk  (02/05/2023)   Overall Financial Resource Strain (CARDIA)    Difficulty of Paying Living Expenses: Not very hard  Food Insecurity: No Food Insecurity (02/05/2023)   Hunger Vital Sign    Worried About Running Out of Food in the Last Year: Never true    Ran Out of Food in the Last Year: Never true  Transportation Needs: No Transportation Needs (02/05/2023)   PRAPARE - Administrator, Civil Service (Medical): No    Lack of Transportation (Non-Medical): No  Physical Activity:  Insufficiently Active (02/05/2023)   Exercise Vital Sign    Days of Exercise per Week: 4 days    Minutes of Exercise per Session: 30 min  Stress: No Stress Concern Present (02/05/2023)   [employer] of Occupational Health - Occupational Stress Questionnaire    Feeling of Stress : Only a little  Social Connections: Moderately Isolated (02/05/2023)   Social Connection and Isolation Panel [NHANES]    Frequency of Communication with Friends and Family: More than three times a week    Frequency of Social Gatherings with Friends and Family: Three times a week    Attends Religious Services: Never  Active Member of Clubs or Organizations: No    Attends Banker Meetings: Never    Marital Status: Married  Catering manager Violence: Not At Risk (02/08/2023)   Humiliation, Afraid, Rape, and Kick questionnaire    Fear of Current or Ex-Partner: No    Emotionally Abused: No    Physically Abused: No    Sexually Abused: No   Family Status  Relation Name Status   Mother  Deceased   Father  Deceased   Sister  Alive   Sister  Alive   Brother  Deceased at age 5       MI   Mat Aunt  Deceased at age 21   Cousin  Alive   Neg Hx  (Not Specified)  No partnership data on file   Family History  Problem Relation Age of Onset   Angioedema Mother    Dementia Mother    Breast cancer Mother 84   Kidney failure Mother 83       died from kidney failure from abx given to her for pneumonia   Hyperlipidemia Mother    Hypertension Mother    Diabetes Mother    Cancer Mother 48       breast   Parkinsonism Father    Diabetes Father 82   Hypertension Sister    Diabetes Brother    Hypertension Brother    Heart attack Brother    Heart disease Cousin 49       mi   Colon cancer Neg Hx    Esophageal cancer Neg Hx    Stomach cancer Neg Hx    Allergies  Allergen Reactions   Codeine Nausea Only    Severe nausea      Review of Systems  Constitutional:  Negative for chills, fever and  malaise/fatigue.  HENT:  Negative for congestion and hearing loss.   Eyes:  Negative for blurred vision and discharge.  Respiratory:  Negative for cough, sputum production and shortness of breath.   Cardiovascular:  Negative for chest pain, palpitations and leg swelling.  Gastrointestinal:  Negative for abdominal pain, blood in stool, constipation, diarrhea, heartburn, nausea and vomiting.  Genitourinary:  Negative for dysuria, frequency, hematuria and urgency.  Musculoskeletal:  Negative for back pain, falls and myalgias.  Skin:  Negative for rash.  Neurological:  Negative for dizziness, sensory change, loss of consciousness, weakness and headaches.  Endo/Heme/Allergies:  Negative for environmental allergies. Does not bruise/bleed easily.  Psychiatric/Behavioral:  Negative for depression and suicidal ideas. The patient is not nervous/anxious and does not have insomnia.       Objective:     BP 110/70 (BP Location: Right Arm, Patient Position: Sitting, Cuff Size: Normal)   Pulse 77   Temp 97.8 F (36.6 C) (Oral)   Resp 18   Ht 5\' 6"  (1.676 m)   Wt 144 lb 6.4 oz (65.5 kg)   SpO2 97%   BMI 23.31 kg/m  BP Readings from Last 3 Encounters:  02/12/23 110/70  02/08/23 118/69  01/15/23 120/60   Wt Readings from Last 3 Encounters:  02/12/23 144 lb 6.4 oz (65.5 kg)  02/08/23 145 lb 8 oz (66 kg)  01/15/23 146 lb 3.2 oz (66.3 kg)   SpO2 Readings from Last 3 Encounters:  02/12/23 97%  01/15/23 98%  12/29/22 96%      Physical Exam Vitals and nursing note reviewed.  Constitutional:      General: She is not in acute distress.    Appearance: Normal appearance.  She is well-developed.  HENT:     Head: Normocephalic and atraumatic.  Eyes:     General: No scleral icterus.       Right eye: No discharge.        Left eye: No discharge.  Cardiovascular:     Rate and Rhythm: Normal rate and regular rhythm.     Heart sounds: No murmur heard. Pulmonary:     Effort: Pulmonary effort is  normal. No respiratory distress.     Breath sounds: Normal breath sounds.  Musculoskeletal:        General: Normal range of motion.     Cervical back: Normal range of motion and neck supple.     Right lower leg: No edema.     Left lower leg: No edema.  Skin:    General: Skin is warm and dry.  Neurological:     Mental Status: She is alert and oriented to person, place, and time.  Psychiatric:        Mood and Affect: Mood normal.        Behavior: Behavior normal.        Thought Content: Thought content normal.        Judgment: Judgment normal.     No results found for any visits on 02/12/23.  Last CBC Lab Results  Component Value Date   WBC 9.4 01/15/2023   HGB 11.2 (L) 01/15/2023   HCT 33.6 (L) 01/15/2023   MCV 86.7 01/15/2023   MCH 28.6 12/29/2022   RDW 15.5 01/15/2023   PLT 267.0 01/15/2023   Last metabolic panel Lab Results  Component Value Date   GLUCOSE 101 (H) 12/29/2022   NA 138 12/29/2022   K 3.7 12/29/2022   CL 101 12/29/2022   CO2 23 12/29/2022   BUN 15 12/29/2022   CREATININE 0.96 12/29/2022   GFRNONAA >60 12/29/2022   CALCIUM 7.3 (L) 12/29/2022   PHOS 8.7 (H) 11/27/2022   PROT 7.4 12/29/2022   ALBUMIN 4.4 12/29/2022   BILITOT 0.4 12/29/2022   ALKPHOS 40 12/29/2022   AST 18 12/29/2022   ALT 13 12/29/2022   ANIONGAP 14 12/29/2022   Last lipids Lab Results  Component Value Date   CHOL 154 08/18/2022   HDL 44.70 08/18/2022   LDLCALC 81 08/18/2022   LDLDIRECT 87.0 06/07/2020   TRIG 144.0 08/18/2022   CHOLHDL 3 08/18/2022   Last hemoglobin A1c Lab Results  Component Value Date   HGBA1C 6.5 10/19/2022   Last thyroid functions Lab Results  Component Value Date   TSH 3.04 10/19/2022   Last vitamin D Lab Results  Component Value Date   VD25OH 43 02/23/2010   Last vitamin B12 and Folate Lab Results  Component Value Date   VITAMINB12 1,337 (H) 04/17/2019   FOLATE 6.7 09/11/2007      The 10-year ASCVD risk score (Arnett DK, et al.,  2019) is: 20.7%    Assessment & Plan:   Problem List Items Addressed This Visit       Unprioritized   GERD   Relevant Medications   omeprazole (PRILOSEC) 20 MG capsule   Hypothyroidism   Relevant Orders   TSH   Iron deficiency anemia   Relevant Orders   CBC with Differential/Platelet   Hyperlipidemia associated with type 2 diabetes mellitus (HCC) - Primary    Tolerating statin, encouraged heart healthy diet, avoid trans fats, minimize simple carbs and saturated fats. Increase exercise as tolerated       Relevant Medications  rosuvastatin (CRESTOR) 40 MG tablet   fenofibrate 160 MG tablet   Other Relevant Orders   CBC with Differential/Platelet   Comprehensive metabolic panel   Lipid panel   Essential hypertension    Well controlled, no changes to meds. Encouraged heart healthy diet such as the DASH diet and exercise as tolerated.        Relevant Medications   rosuvastatin (CRESTOR) 40 MG tablet   fenofibrate 160 MG tablet   Other Relevant Orders   CBC with Differential/Platelet   Comprehensive metabolic panel   Lipid panel   Controlled type 2 diabetes mellitus with hyperglycemia, without long-term current use of insulin (HCC)    hgba1c to be checked,  minimize simple carbs. Increase exercise as tolerated. Continue current meds       Relevant Medications   rosuvastatin (CRESTOR) 40 MG tablet   Other Relevant Orders   Hemoglobin A1c    No follow-ups on file.    Donato Schultz, DO

## 2023-02-12 NOTE — Addendum Note (Signed)
Addended by: Roxanne Gates on: 02/12/2023 10:00 AM   Modules accepted: Orders

## 2023-02-12 NOTE — Assessment & Plan Note (Signed)
Well controlled, no changes to meds. Encouraged heart healthy diet such as the DASH diet and exercise as tolerated.  °

## 2023-02-12 NOTE — Assessment & Plan Note (Signed)
hgba1c to be checked, minimize simple carbs. Increase exercise as tolerated. Continue current meds  

## 2023-02-12 NOTE — Assessment & Plan Note (Signed)
Tolerating statin, encouraged heart healthy diet, avoid trans fats, minimize simple carbs and saturated fats. Increase exercise as tolerated 

## 2023-02-23 ENCOUNTER — Telehealth: Payer: Self-pay | Admitting: Family Medicine

## 2023-02-23 DIAGNOSIS — E118 Type 2 diabetes mellitus with unspecified complications: Secondary | ICD-10-CM

## 2023-02-23 DIAGNOSIS — K219 Gastro-esophageal reflux disease without esophagitis: Secondary | ICD-10-CM

## 2023-02-23 DIAGNOSIS — E1169 Type 2 diabetes mellitus with other specified complication: Secondary | ICD-10-CM

## 2023-02-23 MED ORDER — ROSUVASTATIN CALCIUM 40 MG PO TABS
40.0000 mg | ORAL_TABLET | Freq: Every day | ORAL | 1 refills | Status: DC
Start: 2023-02-23 — End: 2023-06-25

## 2023-02-23 MED ORDER — OMEPRAZOLE 20 MG PO CPDR
20.0000 mg | DELAYED_RELEASE_CAPSULE | Freq: Every day | ORAL | 3 refills | Status: DC
Start: 2023-02-23 — End: 2023-08-02

## 2023-02-23 MED ORDER — FENOFIBRATE 160 MG PO TABS
160.0000 mg | ORAL_TABLET | Freq: Every day | ORAL | 1 refills | Status: DC
Start: 2023-02-23 — End: 2023-07-30

## 2023-02-23 MED ORDER — LISINOPRIL 5 MG PO TABS
5.0000 mg | ORAL_TABLET | Freq: Every day | ORAL | 1 refills | Status: DC
Start: 2023-02-23 — End: 2023-05-14

## 2023-02-23 NOTE — Addendum Note (Signed)
Addended by: Roxanne Gates on: 02/23/2023 03:41 PM   Modules accepted: Orders

## 2023-02-23 NOTE — Telephone Encounter (Signed)
Whats the medication.

## 2023-02-23 NOTE — Telephone Encounter (Signed)
Called [facility] for medication names. See corrected request below. Routed HP due to internal error.

## 2023-02-23 NOTE — Telephone Encounter (Signed)
Rx sent 

## 2023-02-23 NOTE — Telephone Encounter (Addendum)
Prescription Request  02/23/2023  Is this a "Controlled Substance" medicine? No  LOV: 02/12/2023  What is the name of the medication or equipment?   fenofibrate 160 MG tablet [[id number]]  omeprazole (PRILOSEC) 20 MG capsule [[id number]]  lisinopril (ZESTRIL) 5 MG tablet [[id number]]  rosuvastatin (CRESTOR) 40 MG tablet [[id number]]  Have you contacted your pharmacy to request a refill? No   Which pharmacy would you like this sent to?   [facility] [address] [place] [address] P: [phone]    Patient notified that their request is being sent to the clinical staff for review and that they should receive a response within 2 business days.   Please advise at Mobile [phone] (mobile)

## 2023-03-08 DIAGNOSIS — H401131 Primary open-angle glaucoma, bilateral, mild stage: Secondary | ICD-10-CM | POA: Diagnosis not present

## 2023-03-14 ENCOUNTER — Ambulatory Visit
Admission: RE | Admit: 2023-03-14 | Discharge: 2023-03-14 | Disposition: A | Discharge status: Home / Self Care | Payer: Medicare Other | Source: Ambulatory Visit | Attending: Family Medicine | Admitting: Family Medicine

## 2023-03-14 DIAGNOSIS — R911 Solitary pulmonary nodule: Secondary | ICD-10-CM

## 2023-03-14 DIAGNOSIS — Z87891 Personal history of nicotine dependence: Secondary | ICD-10-CM | POA: Diagnosis not present

## 2023-03-14 DIAGNOSIS — Z122 Encounter for screening for malignant neoplasm of respiratory organs: Secondary | ICD-10-CM | POA: Diagnosis not present

## 2023-03-19 ENCOUNTER — Other Ambulatory Visit: Payer: Self-pay | Admitting: "Endocrinology

## 2023-03-19 DIAGNOSIS — E119 Type 2 diabetes mellitus without complications: Secondary | ICD-10-CM

## 2023-03-24 DIAGNOSIS — Z20822 Contact with and (suspected) exposure to covid-19: Secondary | ICD-10-CM | POA: Diagnosis not present

## 2023-03-24 DIAGNOSIS — J0110 Acute frontal sinusitis, unspecified: Secondary | ICD-10-CM | POA: Diagnosis not present

## 2023-03-28 ENCOUNTER — Telehealth: Payer: Self-pay | Admitting: Acute Care

## 2023-03-28 NOTE — Telephone Encounter (Signed)
CT Chest Nodule 03/15/2023

## 2023-03-29 ENCOUNTER — Telehealth: Payer: Self-pay | Admitting: Acute Care

## 2023-03-29 DIAGNOSIS — R911 Solitary pulmonary nodule: Secondary | ICD-10-CM

## 2023-03-29 DIAGNOSIS — Z87891 Personal history of nicotine dependence: Secondary | ICD-10-CM

## 2023-03-29 NOTE — Telephone Encounter (Signed)
Call report received and noted in [facility] dashboard  IMPRESSION: 1. Lung-RADS 4B, suspicious. Additional imaging evaluation or consultation with Pulmonology or Thoracic Surgery recommended. Enlarging lobulated solid posterior right lower lobe pulmonary nodule measuring 6.7 mm in volume derived mean diameter, increased from 4.8 mm on 09/07/2022 screening chest CT, waxing and waning over the previous several studies. New indistinct solid 10.2 mm medial left lower lobe pulmonary nodule, which appears somewhat bandlike on coronal reformats, potentially inflammatory. Follow up low-dose chest CT without contrast in 3 months (please use the following order, "CT CHEST L[facility] NODULE FOLLOW-UP W/O CM") is recommended. 2. Three-vessel coronary atherosclerosis. 3. Aortic Atherosclerosis (ICD10-I70.0) and Emphysema (ICD10-J43.9).

## 2023-03-29 NOTE — Telephone Encounter (Signed)
I have called the patient with the results of her low-dose screening CT.  I explained that her scan showed an enlarging lobulated solid posterior right lower pulmonary node that measured 4.8 mm March 2024 and now measures 6.7 mm.  Additionally the patient has a new indistinct solid 10.2 mm left lower lobe pulmonary nodule that appears potentially inflammatory.  I spoke with the patient who said that in June she had a left lower lobe pneumonia.  She also had sepsis for this and was hospitalized for significant period of time. Plan will be for a 58-month follow-up low-dose screening CT which will be due mid December 2024 to reevaluate the right lower lobe pulmonary nodule that has appeared to grow in a 70-month period of time.  Additionally we can reevaluate the left lower lobe nodule which we suspect is postinfectious inflammation. Patient is in agreement with this plan. Sherre Lain, and Bicknell, please fax results to PCP and let them know plan will be for 77-month follow-up low-dose screening CT to better evaluate these findings and a short interval. Patient will need a 51-month low-dose CT follow-up in December 2024. Thank you so much

## 2023-03-29 NOTE — Telephone Encounter (Signed)
CT results/ plans faxed to PCP. Order placed for 3 month nodule f/u [facility] CT.

## 2023-04-03 ENCOUNTER — Ambulatory Visit (INDEPENDENT_AMBULATORY_CARE_PROVIDER_SITE_OTHER): Payer: Medicare Other | Admitting: Family

## 2023-04-03 VITALS — BP 136/71 | HR 76 | Temp 97.6°F | Resp 16 | Wt 144.0 lb

## 2023-04-03 DIAGNOSIS — J310 Chronic rhinitis: Secondary | ICD-10-CM

## 2023-04-03 MED ORDER — IPRATROPIUM BROMIDE 0.06 % NA SOLN: 2.0000 | Freq: Three times a day (TID) | NASAL | 5 refills | Status: DC | PRN

## 2023-04-03 MED ORDER — MONTELUKAST SODIUM 10 MG PO TABS: 10.0000 mg | ORAL_TABLET | Freq: Every day | ORAL | 3 refills | Status: DC

## 2023-04-03 NOTE — Progress Notes (Signed)
Subjective:     Patient ID: Alyssa Ford, female    DOB: 23-Jun-1951, 72 y.o.   MRN: [id number]  Chief Complaint  Patient presents with   Nasal Congestion    Complains of nasal congestion for about  week    HPI  Discussed the use of AI scribe software for clinical note transcription with the patient, who gave verbal consent to proceed.  History of Present Illness   The patient presents with chronic rhinorrhea, which has been worsening over the past week. She describes a constant runny nose that is not relieved by various over-the-counter medications including Claritin, Allegra, Xyzal, Zyrtec, Sudafed, and Alka-Seltzer. She has also tried prescription nasal sprays, including Flonase and ipratropium, with limited relief. The patient denies any associated sinus pain, but notes a sensation of fullness in the upper nasal passages.  The patient sought care at an urgent care center two weeks ago, where she was treated for a presumed sinus infection with amoxicillin. However, she experienced severe vomiting after taking the medication and discontinued it after two days. Her symptoms temporarily improved but have since returned.  The patient was evaluated by an allergist last year, undergoing both skin and blood testing, but reports no allergies were identified. She denies any other symptoms of illness, such as fever or malaise, and her nasal discharge is clear, not yellow or green.          Health Maintenance Due  Topic Date Due   INFLUENZA VACCINE  01/25/2023   COVID-19 Vaccine (7 - 2023-24 season) 02/25/2023   FOOT EXAM  03/24/2023    Past Medical History:  Diagnosis Date   Anemia    Anxiety    Arthritis    Cataract    Chronic headaches    Depression    Diabetes mellitus    Diverticulosis of colon (without mention of hemorrhage) 2003, 2008   Colonoscopy   Eczema    Emphysema of lung (HCC)    GERD (gastroesophageal reflux disease)    Glaucoma    Hiatal hernia 2003   EGD     Hyperlipidemia    Hypertension    Thyroid nodule     Past Surgical History:  Procedure Laterality Date   CARPAL TUNNEL RELEASE  2006   CARPAL TUNNEL RELEASE  12/24/2007   CATARACT EXTRACTION, BILATERAL     [facility]---  sept and oct 2018   CERVICAL FUSION  08/2006   CHOLECYSTECTOMY     COSMETIC SURGERY  December 09 2018   EYE SURGERY  catracts   FASCIECTOMY Left 08/01/2018   Procedure: FASCIECTOMY LEFT RING;  Surgeon: Cindee Salt, MD;  Location: [facility];  Service: Orthopedics;  Laterality: Left;  UPPER ARM   SPINE SURGERY  neck dic replaced   TRIGGER FINGER RELEASE Left 08/01/2018   Procedure: RELEASE TRIGGER FINGER/A-1 PULLEY LEFTRING;  Surgeon: Cindee Salt, MD;  Location: [facility];  Service: Orthopedics;  Laterality: Left;   TUBAL LIGATION      Family History  Problem Relation Age of Onset   Angioedema Mother    Dementia Mother    Breast cancer Mother 9   Kidney failure Mother 54       died from kidney failure from abx given to her for pneumonia   Hyperlipidemia Mother    Hypertension Mother    Diabetes Mother    Cancer Mother 1       breast   Parkinsonism Father    Diabetes Father 62  Hypertension Sister    Diabetes Brother    Hypertension Brother    Heart attack Brother    Heart disease Cousin 33       mi   Colon cancer Neg Hx    Esophageal cancer Neg Hx    Stomach cancer Neg Hx     Social History   Socioeconomic History   Marital status: Married    Spouse name: Not on file   Number of children: Not on file   Years of education: Not on file   Highest education level: 12th grade  Occupational History   Occupation: retired  Tobacco Use   Smoking status: Former    Current packs/day: 0.00    Average packs/day: 1.4 packs/day for 49.0 years (68.6 ttl pk-yrs)    Types: Cigarettes    Start date: 07/15/1966    Quit date: 07/16/2015    Years since quitting: 7.7    Passive exposure: Past   Smokeless tobacco: Never   Tobacco  comments:    Continues to vape but states pod do not contain nicotine  Vaping Use   Vaping status: Former  Substance and Sexual Activity   Alcohol use: Yes    Alcohol/week: 0.0 standard drinks of alcohol    Comment: once weekly; wine and beer   Drug use: No   Sexual activity: Not Currently    Partners: Male  Other Topics Concern   Not on file  Social History Narrative   Exercise--  walk   Social Determinants of Health   Financial Resource Strain: Low Risk  (02/05/2023)   Overall Financial Resource Strain (CARDIA)    Difficulty of Paying Living Expenses: Not very hard  Food Insecurity: No Food Insecurity (02/05/2023)   Hunger Vital Sign    Worried About Running Out of Food in the Last Year: Never true    Ran Out of Food in the Last Year: Never true  Transportation Needs: No Transportation Needs (02/05/2023)   PRAPARE - Administrator, Civil Service (Medical): No    Lack of Transportation (Non-Medical): No  Physical Activity: Insufficiently Active (02/05/2023)   Exercise Vital Sign    Days of Exercise per Week: 4 days    Minutes of Exercise per Session: 30 min  Stress: No Stress Concern Present (02/05/2023)   [employer] of Occupational Health - Occupational Stress Questionnaire    Feeling of Stress : Only a little  Social Connections: Moderately Isolated (02/05/2023)   Social Connection and Isolation Panel [NHANES]    Frequency of Communication with Friends and Family: More than three times a week    Frequency of Social Gatherings with Friends and Family: Three times a week    Attends Religious Services: Never    Active Member of Clubs or Organizations: No    Attends Banker Meetings: Never    Marital Status: Married  Catering manager Violence: Not At Risk (02/08/2023)   Humiliation, Afraid, Rape, and Kick questionnaire    Fear of Current or Ex-Partner: No    Emotionally Abused: No    Physically Abused: No    Sexually Abused: No    Outpatient  Medications Prior to Visit  Medication Sig Dispense Refill   amitriptyline (ELAVIL) 25 MG tablet TAKE 2 TABLETS BY MOUTH DAILY AT 12 PM NOON 180 tablet 1   aspirin EC 81 MG tablet Take 81 mg by mouth daily.     BIOTIN PO Take 1 tablet by mouth daily.      Coenzyme  Q10 (CO Q 10 PO) Take 1 capsule by mouth daily.     fenofibrate 160 MG tablet Take 1 tablet (160 mg total) by mouth daily. 90 tablet 1   Ferrous Sulfate (IRON SUPPLEMENT PO) Take 1 tablet by mouth every other day. 65mg      fluticasone (FLONASE) 50 MCG/ACT nasal spray Place 2 sprays into both nostrils daily. (Patient taking differently: Place 2 sprays into both nostrils daily as needed for allergies.) 16 g 5   latanoprost (XALATAN) 0.005 % ophthalmic solution Place 1 drop into both eyes at bedtime.      lisinopril (ZESTRIL) 5 MG tablet Take 1 tablet (5 mg total) by mouth daily. 90 tablet 1   metFORMIN (GLUCOPHAGE-XR) 500 MG 24 hr tablet TAKE 4 TABLETS BY MOUTH EVERY DAY 360 tablet 0   Naproxen Sodium (ALEVE PO) Take 2 tablets by mouth daily as needed (pain).     NONFORMULARY OR COMPOUNDED ITEM Cbd /delta 9 1 po qhs (Patient taking differently: Take 1 tablet by mouth at bedtime. Cbd /delta 9 1 po qhs) 30 each 0   omeprazole (PRILOSEC) 20 MG capsule Take 1 capsule (20 mg total) by mouth daily. 90 capsule 3   repaglinide (PRANDIN) 1 MG tablet Take 2 tablets (2 mg total) by mouth daily. Before evening meal. 180 tablet 0   rosuvastatin (CRESTOR) 40 MG tablet Take 1 tablet (40 mg total) by mouth at bedtime. 90 tablet 1   Semaglutide (RYBELSUS) 3 MG TABS 1 tablet daily (Patient taking differently: Take 1 tablet by mouth daily.) 90 tablet 1   ipratropium (ATROVENT) 0.06 % nasal spray Place 2 sprays into both nostrils 3 (three) times daily as needed for rhinitis. 15 mL 5   ondansetron (ZOFRAN-ODT) 8 MG disintegrating tablet Take 1 tablet (8 mg total) by mouth every 8 (eight) hours as needed for nausea or vomiting. 20 tablet 0   No  facility-administered medications prior to visit.    Allergies  Allergen Reactions   Codeine Nausea Only    Severe nausea   Augmentin [Amoxicillin-Pot Clavulanate]     Severe nausea and vomitting    ROS    See hpi Objective:    Physical Exam Constitutional:      General: She is not in acute distress.    Appearance: Normal appearance. She is well-developed.  HENT:     Head: Normocephalic and atraumatic.     Right Ear: Tympanic membrane, ear canal and external ear normal.     Left Ear: Tympanic membrane, ear canal and external ear normal.     Nose:     Right Sinus: No maxillary sinus tenderness or frontal sinus tenderness.     Left Sinus: No maxillary sinus tenderness or frontal sinus tenderness.     Mouth/Throat:     Mouth: Mucous membranes are moist.     Pharynx: Oropharynx is clear. No pharyngeal swelling or posterior oropharyngeal erythema.  Eyes:     General: No scleral icterus. Neck:     Thyroid: No thyromegaly.  Cardiovascular:     Rate and Rhythm: Normal rate and regular rhythm.     Heart sounds: Normal heart sounds. No murmur heard. Pulmonary:     Effort: Pulmonary effort is normal. No respiratory distress.     Breath sounds: Normal breath sounds. No wheezing.  Musculoskeletal:     Cervical back: Neck supple.  Skin:    General: Skin is warm and dry.  Neurological:     Mental Status: She is alert and  oriented to person, place, and time.  Psychiatric:        Mood and Affect: Mood normal.        Behavior: Behavior normal.        Thought Content: Thought content normal.        Judgment: Judgment normal.      BP 136/71 (BP Location: Left Arm, Patient Position: Sitting, Cuff Size: Small)   Pulse 76   Temp 97.6 F (36.4 C) (Oral)   Resp 16   Wt 144 lb (65.3 kg)   SpO2 100%   BMI 23.24 kg/m  Wt Readings from Last 3 Encounters:  04/03/23 144 lb (65.3 kg)  02/12/23 144 lb 6.4 oz (65.5 kg)  02/08/23 145 lb 8 oz (66 kg)       Assessment & Plan:    Problem List Items Addressed This Visit       Unprioritized   Chronic rhinitis - Primary    No obvious sign of bacterial sinusitis.  She has not had any improvement with otc anthistamines or nasal steroid spray. Her ENT has prescribed ipratropium nasal spray in the past which has run out.  Will refill this spray and give trial of singulair.  Pt is advised to call if symptoms worsen or if new symptoms.        I have discontinued Cozy H. Ludington's ondansetron. I am also having her start on montelukast. Additionally, I am having her maintain her BIOTIN PO, Naproxen Sodium (ALEVE PO), latanoprost, Ferrous Sulfate (IRON SUPPLEMENT PO), aspirin EC, Coenzyme Q10 (CO Q 10 PO), NONFORMULARY OR COMPOUNDED ITEM, fluticasone, repaglinide, Rybelsus, amitriptyline, fenofibrate, omeprazole, lisinopril, rosuvastatin, metFORMIN, and ipratropium.  Meds ordered this encounter  Medications   ipratropium (ATROVENT) 0.06 % nasal spray    Sig: Place 2 sprays into both nostrils 3 (three) times daily as needed for rhinitis.    Dispense:  15 mL    Refill:  5    Order Specific Question:   Supervising Provider    Answer:   Danise Edge A [[id number]]   montelukast (SINGULAIR) 10 MG tablet    Sig: Take 1 tablet (10 mg total) by mouth at bedtime.    Dispense:  30 tablet    Refill:  3    Order Specific Question:   Supervising Provider    Answer:   Danise Edge A [[id number]]

## 2023-04-03 NOTE — Assessment & Plan Note (Addendum)
No obvious sign of bacterial sinusitis.  She has not had any improvement with otc anthistamines or nasal steroid spray. Her ENT has prescribed ipratropium nasal spray in the past which has run out.  Will refill this spray and give trial of singulair.  Pt is advised to call if symptoms worsen or if new symptoms.

## 2023-04-03 NOTE — Patient Instructions (Signed)
VISIT SUMMARY:  During your visit, we discussed your ongoing issue with a runny nose, which has been getting worse over the past week. Despite trying various over-the-counter medications and prescription nasal sprays, you have not found significant relief. You also mentioned a feeling of fullness in your upper nasal passages. You were treated for a presumed sinus infection two weeks ago, but your symptoms have since returned. Allergy tests conducted last year did not identify any allergies.  YOUR PLAN:  -CHRONIC RHINITIS: Chronic Rhinitis is a condition characterized by a long-term runny or stuffy nose. We will refill your prescription for Ipratropium nasal spray, which can help reduce nasal drainage. Additionally, we will start you on a medication called Singulair, which is taken at bedtime and can help control your symptoms. It's important to monitor for any changes in your symptoms, particularly if your nasal discharge becomes yellow or green, or if you start to experience sinus pain, as these could be signs of a bacterial sinus infection.  INSTRUCTIONS:  Please start taking Singulair at bedtime and continue using the Ipratropium nasal spray as directed. Monitor your symptoms closely and contact the office if you notice any changes, especially if your nasal discharge changes color or you start to experience sinus pain.

## 2023-04-30 ENCOUNTER — Telehealth: Payer: Self-pay | Admitting: Family Medicine

## 2023-04-30 DIAGNOSIS — E119 Type 2 diabetes mellitus without complications: Secondary | ICD-10-CM

## 2023-04-30 NOTE — Telephone Encounter (Signed)
Pt called wanting to speak to Tammy about her Rybelsus when possible.

## 2023-05-01 NOTE — Telephone Encounter (Signed)
Spoke with patient. She would like to retry Rybelsus 7mg  dose. She feels that 3mg  is not keeping blood glucose or controlling appetite as well.  Will completed updated Rx for Rybelsus 7mg  thru [employer] medication assistance program and forward to PCP to sign.   Also willl mail 2025 application for [employer] medication assistance program to patient.

## 2023-05-02 MED ORDER — RYBELSUS 7 MG PO TABS: 7.0000 mg | ORAL_TABLET | Freq: Every day | ORAL | Status: DC

## 2023-05-02 NOTE — Addendum Note (Signed)
Addended by: Henrene Pastor B on: 05/02/2023 04:35 PM   Modules accepted: Orders

## 2023-05-10 ENCOUNTER — Telehealth: Payer: Self-pay | Admitting: Family Medicine

## 2023-05-10 NOTE — Telephone Encounter (Signed)
Pt dropped off copy of her Assistance Program Application for pharmacist to verify. Document put at front office tray under pharmacist name.

## 2023-05-12 ENCOUNTER — Other Ambulatory Visit: Payer: Self-pay | Admitting: Family Medicine

## 2023-05-12 DIAGNOSIS — E118 Type 2 diabetes mellitus with unspecified complications: Secondary | ICD-10-CM

## 2023-05-15 NOTE — Telephone Encounter (Signed)
Reviewed application and completed provider portion. Forwarded to PCP to review and sign if she approves

## 2023-05-21 NOTE — Telephone Encounter (Signed)
Received signed [employer] application back from PCP - faxed to patient assistance program .

## 2023-05-30 ENCOUNTER — Telehealth: Payer: Self-pay | Admitting: Family Medicine

## 2023-05-30 NOTE — Telephone Encounter (Signed)
Pt called and asked to speak with you about Semaglutide (RYBELSUS) 7 MG TABS. She stated that she just wants more information on it. Please call and advise.

## 2023-05-31 NOTE — Telephone Encounter (Signed)
Tried to call patient back. No answer / LM on VM with CB# [phone]

## 2023-05-31 NOTE — Telephone Encounter (Signed)
Patient was calling to check on Rybelsus 2025 application. Application was faxed to [employer] 05/21/2023

## 2023-06-05 ENCOUNTER — Telehealth: Payer: Self-pay

## 2023-06-05 NOTE — Telephone Encounter (Signed)
Pt made aware that Rybelsus has arrived. Placed at the front for pickup

## 2023-06-11 ENCOUNTER — Telehealth: Payer: Self-pay

## 2023-06-11 ENCOUNTER — Other Ambulatory Visit: Payer: Self-pay

## 2023-06-11 NOTE — Telephone Encounter (Signed)
Spoke with patient. Called to schedule 3 month f/u scan. Scheduled for 12/18 at 9am at GI.

## 2023-06-13 ENCOUNTER — Ambulatory Visit
Admission: RE | Admit: 2023-06-13 | Discharge: 2023-06-13 | Disposition: A | Discharge status: Home / Self Care | Payer: Medicare Other | Source: Ambulatory Visit | Attending: Acute Care | Admitting: Acute Care

## 2023-06-13 DIAGNOSIS — R918 Other nonspecific abnormal finding of lung field: Secondary | ICD-10-CM | POA: Diagnosis not present

## 2023-06-13 DIAGNOSIS — Z87891 Personal history of nicotine dependence: Secondary | ICD-10-CM | POA: Diagnosis not present

## 2023-06-13 DIAGNOSIS — R911 Solitary pulmonary nodule: Secondary | ICD-10-CM

## 2023-06-24 ENCOUNTER — Other Ambulatory Visit: Payer: Self-pay | Admitting: Family Medicine

## 2023-06-24 DIAGNOSIS — E1169 Type 2 diabetes mellitus with other specified complication: Secondary | ICD-10-CM

## 2023-06-24 DIAGNOSIS — E118 Type 2 diabetes mellitus with unspecified complications: Secondary | ICD-10-CM

## 2023-06-26 ENCOUNTER — Other Ambulatory Visit: Payer: Self-pay | Admitting: Family Medicine

## 2023-06-26 ENCOUNTER — Telehealth: Payer: Self-pay | Admitting: Acute Care

## 2023-06-26 NOTE — Telephone Encounter (Signed)
Duplicate message.  Report has been routed

## 2023-06-26 NOTE — Telephone Encounter (Signed)
CT from the 26th of December

## 2023-06-26 NOTE — Telephone Encounter (Signed)
Calling with call report.

## 2023-06-26 NOTE — Telephone Encounter (Signed)
 Call report received:  IMPRESSION: 1. Lung-RADS 4B, suspicious. Additional imaging evaluation or consultation with Pulmonology or Thoracic Surgery recommended. Right lower lobe, 8.4 mm, image 146/4. 2. Coronary artery calcifications 3. Aortic Atherosclerosis (ICD10-I70.0) and Emphysema (ICD10-J43.9).

## 2023-06-28 ENCOUNTER — Telehealth: Payer: Self-pay | Admitting: Acute Care

## 2023-06-28 ENCOUNTER — Other Ambulatory Visit: Payer: Self-pay

## 2023-06-28 DIAGNOSIS — R911 Solitary pulmonary nodule: Secondary | ICD-10-CM

## 2023-06-28 NOTE — Addendum Note (Signed)
 Addended by: Sheran Luz on: 06/28/2023 01:49 PM   Modules accepted: Orders

## 2023-06-28 NOTE — Telephone Encounter (Signed)
 Patient is trying to return missed call. She states that she has not been sick recently. Please call back at [phone]

## 2023-06-28 NOTE — Telephone Encounter (Addendum)
 Returned call. Patient states no recent illness. Advises she had pneumonia and sepsis summer of 2024. No illness since, not even a cold. Pt agrees to complete PET scan. She prefers to complete scan at [facility] if possible. She is flexible with days/times. Results sent to PCP with plan. Sarah-will you order the PET?

## 2023-06-28 NOTE — Telephone Encounter (Signed)
 PET scan ordered. Will keep phone note to follow up on.

## 2023-06-28 NOTE — Telephone Encounter (Signed)
 I have attempted to call the patient with the results of their  Low Dose CT Chest Lung cancer screening scan. There was no answer. I have left a HIPPA compliant VM requesting the patient call the office for the scan results. I included the office contact information in the message. We will await his return call. If no return call we will continue to call until patient is contacted.    She needs a  PET scan. The nodule is slowly growing. If she calls back, p[lease ask her if she has been sick. Thanks so much

## 2023-07-05 ENCOUNTER — Other Ambulatory Visit: Payer: Self-pay | Admitting: Pharmacist

## 2023-07-05 NOTE — Progress Notes (Unsigned)
 07/05/2023 Name: Alyssa Ford MRN: [id number] DOB: 1950/11/12  No chief complaint on file.   {Visit Ubez:73349}   Subjective:  Care Team: Primary Care Provider: Antonio Meth, Jamee SAUNDERS, DO ; Next Scheduled Visit: *** {careteamprovider:27366}  Medication Access/Adherence  Current Pharmacy:  [id number] [place], [place] - [address] AT [facility] [address] [facility] [address] & [facility] [address] [place] [address] Phone: [phone] Fax: [phone]  [facility] [address] [place] [address] Phone: [phone] Fax: [phone]  [facility] - [place], [place] - [address] [place] [address] Phone: [phone] Fax: [phone]   Patient reports affordability concerns with their medications: {YES/NO:21197} Patient reports access/transportation concerns to their pharmacy: {YES/NO:21197} Patient reports adherence concerns with their medications:  {YES/NO:21197} ***   {Pharmacy S/O Choices:26420}   Objective:  Lab Results  Component Value Date   HGBA1C 6.4 02/12/2023    Lab Results  Component Value Date   CREATININE 1.05 02/12/2023   BUN 22 02/12/2023   NA 140 02/12/2023   K 4.0 02/12/2023   CL 102 02/12/2023   CO2 27 02/12/2023    Lab Results  Component Value Date   CHOL 126 02/12/2023   HDL 43.50 02/12/2023   LDLCALC 54 02/12/2023   LDLDIRECT 87.0 06/07/2020   TRIG 142.0 02/12/2023   CHOLHDL 3 02/12/2023    Medications Reviewed Today     Reviewed by Carla Milling, RPH-CPP (Pharmacist) on 07/05/23 at 1326  Med List Status: <None>   Medication Order Taking? Sig Documenting Provider Last Dose Status Informant  amitriptyline  (ELAVIL ) 25 MG tablet [id number]  Take 2 tablets (50 mg total) by mouth daily at 12 noon. Antonio Meth Jamee SAUNDERS, DO  Active   aspirin  EC 81 MG tablet [id number]  Take 81 mg by mouth daily.  Antonio Meth Jamee SAUNDERS, DO  Active Self  BIOTIN PO [id number]  Take 1 tablet by mouth daily.  [provider]  Active Self  Coenzyme Q10 (CO Q 10 PO) [id number]  Take 1 capsule by mouth daily. [provider]  Active Self  fenofibrate  160 MG tablet [id number]  Take 1 tablet (160 mg total) by mouth daily. Lowne Chase, Yvonne R, DO  Active   Ferrous Sulfate (IRON SUPPLEMENT PO) [id number]  Take 1 tablet by mouth every other day. 65mg  [provider]  Active Self           Med Note ALLEGRA, DEATRICE LILLETTE Repress Nov 26, 2022 10:12 PM)    fluticasone  (FLONASE ) 50 MCG/ACT nasal spray [id number]  Place 2 sprays into both nostrils daily.  Patient taking differently: Place 2 sprays into both nostrils daily as needed for allergies.   Tobie Arleta SQUIBB, MD  Active Self  ipratropium (ATROVENT ) 0.06 % nasal spray [id number]  Place 2 sprays into both nostrils 3 (three) times daily as needed for rhinitis. Daryl Setter, NP  Active   latanoprost  (XALATAN ) 0.005 % ophthalmic solution [id number]  Place 1 drop into both eyes at bedtime.  [provider]  Active Self           Med Note CARLON MYLINDA Schaumann Aug 19, 2020  3:42 PM)    lisinopril  (ZESTRIL ) 5 MG tablet [id number] Yes Take 1 tablet (5 mg total) by mouth daily. Lowne Chase, Yvonne R, DO Taking Active   metFORMIN  (GLUCOPHAGE -XR) 500 MG 24  hr tablet [id number] Yes TAKE 4 TABLETS BY MOUTH EVERY DAY Motwani, Komal, MD Taking Active   montelukast  (SINGULAIR ) 10 MG tablet [id number] Yes Take 1 tablet (10 mg total) by mouth at bedtime. O'Sullivan, Melissa, NP Taking Active   Naproxen Sodium (ALEVE PO) [id number]  Take 2 tablets by mouth daily as needed (pain). [provider]  Active Self           Med Note ALLEGRA, DEATRICE LILLETTE Repress Nov 26, 2022 10:13 PM)    NONFORMULARY OR COMPOUNDED ITEM [id number]  Cbd /delta 9 1 po qhs  Patient taking differently: Take 1 tablet by mouth at bedtime. Cbd /delta 9 1 po qhs   Antonio Meth,  Yvonne R, DO  Active Self  omeprazole  (PRILOSEC) 20 MG capsule [id number]  Take 1 capsule (20 mg total) by mouth daily. Antonio Meth, Yvonne R, DO  Active   repaglinide  (PRANDIN ) 1 MG tablet [id number] Yes Take 2 tablets (2 mg total) by mouth daily. Before evening meal. Dartha Ernst, MD Taking Active Self  rosuvastatin  (CRESTOR ) 40 MG tablet [id number] Yes Take 1 tablet (40 mg total) by mouth at bedtime. Antonio Meth Rockers R, DO Taking Active   Semaglutide  (RYBELSUS ) 7 MG TABS [id number] Yes Take 1 tablet (7 mg total) by mouth daily. Antonio Meth Rockers JONELLE, DO Taking Active               Assessment/Plan:   {Pharmacy A/P Choices:26421}  Follow Up Plan: ***  ***

## 2023-07-06 ENCOUNTER — Telehealth: Payer: Self-pay

## 2023-07-06 ENCOUNTER — Encounter (HOSPITAL_COMMUNITY)
Admission: RE | Admit: 2023-07-06 | Discharge: 2023-07-06 | Disposition: A | Discharge status: Home / Self Care | Payer: Medicare Other | Source: Ambulatory Visit | Attending: Acute Care | Admitting: Acute Care

## 2023-07-06 DIAGNOSIS — R918 Other nonspecific abnormal finding of lung field: Secondary | ICD-10-CM | POA: Diagnosis not present

## 2023-07-06 DIAGNOSIS — R911 Solitary pulmonary nodule: Secondary | ICD-10-CM | POA: Diagnosis not present

## 2023-07-06 LAB — GLUCOSE, CAPILLARY: Glucose-Capillary: 126 mg/dL — ABNORMAL HIGH (ref 70–99)

## 2023-07-06 MED ORDER — FLUDEOXYGLUCOSE F - 18 (FDG) INJECTION
7.2000 | Freq: Once | INTRAVENOUS | Status: AC
  Administered 2023-07-06: 7.2 via INTRAVENOUS

## 2023-07-06 NOTE — Telephone Encounter (Signed)
 Opened in error

## 2023-07-17 ENCOUNTER — Telehealth: Payer: Self-pay | Admitting: *Deleted

## 2023-07-17 NOTE — Telephone Encounter (Signed)
PT has a question with some of the impressions sent to her on [place]. Her # is [phone]

## 2023-07-17 NOTE — Telephone Encounter (Signed)
Patient had recent PET scan. Will need review by provider before contacting patient.

## 2023-07-20 ENCOUNTER — Institutional Professional Consult (permissible substitution): Payer: Medicare Other | Admitting: Acute Care

## 2023-07-23 ENCOUNTER — Encounter: Payer: Self-pay | Admitting: Acute Care

## 2023-07-23 ENCOUNTER — Institutional Professional Consult (permissible substitution): Payer: Medicare Other | Admitting: Acute Care

## 2023-07-23 ENCOUNTER — Telehealth: Payer: Self-pay | Admitting: Acute Care

## 2023-07-23 ENCOUNTER — Ambulatory Visit: Payer: Medicare Other | Admitting: Acute Care

## 2023-07-23 ENCOUNTER — Other Ambulatory Visit: Payer: Self-pay | Admitting: Family Medicine

## 2023-07-23 VITALS — BP 140/64 | HR 88 | Temp 97.8°F | Ht 66.0 in | Wt 141.8 lb

## 2023-07-23 DIAGNOSIS — R911 Solitary pulmonary nodule: Secondary | ICD-10-CM

## 2023-07-23 DIAGNOSIS — Z87891 Personal history of nicotine dependence: Secondary | ICD-10-CM

## 2023-07-23 DIAGNOSIS — E041 Nontoxic single thyroid nodule: Secondary | ICD-10-CM

## 2023-07-23 NOTE — Telephone Encounter (Signed)
Patient had a PET scan on 07/06/2023 that revealed a 2.2cm low attenuation left thyroid nodule. Radiologist viewed the previous thyroid US in 2020 but recommends consideration for a repeat ultrasound if clinically indicated.   Dr. Almeta Monas, would you be willing to order the thyroid ultrasound and follow? Thank you.   Lung Cancer Screening Team

## 2023-07-23 NOTE — Patient Instructions (Signed)
It is good to see you today. The PET scan we did show that the right lower lobe pulmonary nodules was hypermetabolic , and concerning for a stage IA primary bronchogenic carcinoma. Plan will be for bronchoscopy with biopsies to determine if this is an early stage cancer We have reviewed the risks of the procedure to include bleeding, infection, a collapse of the lung ( which can be immediately treated with insertion of a chest tube), and adverse reaction to anesthesia. You will get a letter today with all additional information regarding your bronchoscopy. We will call Dr. [name] and Dr. Barbie Haggis at [facility] to have them follow up on Thyroid finding. You will get a letter today with all information about your procedure.  Hold aspirin 2 days  prior to  procedure. You will need someone to drive you to the hospital, stay during the procedure, and drive you haome, and be with you for 24 hours after the procedure.  You will follow up with me 1 week after the procedure to ensure you have done well after the procedure and go over biopsy results. Please contact office for sooner follow up if symptoms do not improve or worsen or seek emergency care

## 2023-07-23 NOTE — Progress Notes (Signed)
History of Present Illness Alyssa Ford is a 73 y.o. female former smoker  followed through the lung cancer screening program for abnormal imaging. She is here for consultation regarding an enlarging right lower lobe pulmonary nodule.   07/23/2023 Pt. Presents  follow up. She is followed through the lung cancer screening program. Initial scan low-dose screening CT 03/2023 showed an enlarging lobulated solid posterior right lower pulmonary node that measured 4.8 mm March 2024 and now measures 6.7 mm.  Additionally the patient has a new indistinct solid 10.2 mm left lower lobe pulmonary nodule that appears potentially inflammatory.  I spoke with the patient who said that in June she had a left lower lobe pneumonia.  She also had sepsis for this and was hospitalized for significant period of time. As there was potential that the imaging changes were due to infection/ inflammation , we opted for a 2 month follow up Ct Chest to re-evaluate. This was done 05/2023. Repeat imaging was still concerning and read as a LR 4 B. Pt was notified by phone, and plan was for a PET scan and follow up in the office after the PET scan had been read.  Pt. Is here today to review PET scan results. PET scan was positive for a hypermetabolic 7 mm RLL pulmonary nodule, and a 2.2 cm low attenuation left thyroid nodule. The thyroid nodule had been evaluated with [place] in 2020, but radiology is recommending repeat US, which we will ask PCP to order and follow.   We discussed next best steps, which I believe to be a bronchoscopy with biopsies.She is in agreement with this plan/  Test Results: PET Imaging 07/06/2023 Hypermetabolic 7 mm posterior right lower lobe nodule, indicative of stage IA primary bronchogenic carcinoma. 2. Small right renal stone. 3. 2.2 cm low-attenuation left thyroid nodule. Previous thyroid ultrasound 07/11/2018. Consider repeat thyroid ultrasound, as clinically indicated. (Ref: J Am Coll Radiol. 2015  Feb;12(2): 143-50). 4. Aortic atherosclerosis (ICD10-I70.0). Coronary artery calcification.  LDCT Chest 05/2023 Mediastinum/Nodes: No enlarged mediastinal, hilar, or axillary lymph nodes. The trachea and esophagus demonstrate no significant findings. Nodule within the right lobe of thyroid gland measures 1.9 x 1.2 cm, image 6/2. This has been evaluated on previous imaging. (ref: J Am Coll Radiol. 2015 Feb;12(2): 143-50).   Lungs/Pleura: Emphysema. Diffuse bronchial wall thickening. No pleural effusion, airspace consolidation, or pneumothorax. Patchy area of faint ground-glass attenuation and tree-in-bud nodularity noted within the anterior right lower lobe. This is new from the previous exam and most likely represents an inflammatory or infectious bronchiolitis. Nodule within the right lower lobe has a mean derived diameter of 8.3 mm. On the previous exam this measured 6.7 mm. On the more remote exam from 09/07/2022 this measured 4.8 mm and prior to that this measured 6.8 mm. The remaining scattered lung nodules noted previously are stable in the interval.   Upper Abdomen: No acute abnormality.  Cholecystectomy.   Musculoskeletal: No chest wall mass or suspicious bone lesions identified.   IMPRESSION: 1. Lung-RADS 4B, suspicious. Additional imaging evaluation or consultation with Pulmonology or Thoracic Surgery recommended. Right lower lobe, 8.4 mm, image 146/4.    03/2023 LDCT Chest Lungs/Pleura: No pneumothorax. No pleural effusion. Mild centrilobular emphysema with diffuse bronchial wall thickening. No acute consolidative airspace disease or lung masses. Lobulated solid posterior right lower lobe pulmonary nodule measures 6.7 mm in volume derived mean diameter (series 3/image 144), increased from 4.8 mm on 09/07/2022, although decreased from 6.8 mm on 06/08/2022 screening CT.  Other smaller pulmonary nodules are stable. New medial left lower lobe indistinct solid  pulmonary nodule measuring 10.2 mm in volume derived mean diameter (series 3/image 135), which appears somewhat bandlike on coronal reformats. No additional new significant pulmonary nodules.   Upper abdomen: Cholecystectomy.   Musculoskeletal: No aggressive appearing focal osseous lesions. Mild thoracic spondylosis.  1. Lung-RADS 4B, suspicious. Additional imaging evaluation or consultation with Pulmonology or Thoracic Surgery recommended. Enlarging lobulated solid posterior right lower lobe pulmonary nodule measuring 6.7 mm in volume derived mean diameter, increased from 4.8 mm on 09/07/2022 screening chest CT, waxing and waning over the previous several studies. New indistinct solid 10.2 mm medial left lower lobe pulmonary nodule, which appears somewhat bandlike on coronal reformats, potentially inflammatory. Follow up low-dose chest CT without contrast in 3 months      Latest Ref Rng & Units 02/12/2023    9:07 AM 01/15/2023   10:25 AM 12/29/2022    5:11 PM  CBC  WBC 4.0 - 10.5 K/uL 5.9  9.4  9.6   Hemoglobin 12.0 - 15.0 g/dL 09.8  11.9  14.7   Hematocrit 36.0 - 46.0 % 33.6  33.6  33.3   Platelets 150.0 - 400.0 K/uL 267.0  267.0  262        Latest Ref Rng & Units 02/12/2023    9:07 AM 12/29/2022    5:11 PM 12/29/2022    2:05 PM  BMP  Glucose 70 - 99 mg/dL 829  562  77   BUN 6 - 23 mg/dL 22  15  16    Creatinine 0.40 - 1.20 mg/dL 1.30  8.65  7.84   BUN/Creat Ratio 6 - 22 (calc)   16   Sodium 135 - 145 mEq/L 140  138  142   Potassium 3.5 - 5.1 mEq/L 4.0  3.7  3.9   Chloride 96 - 112 mEq/L 102  101  105   CO2 19 - 32 mEq/L 27  23  25    Calcium 8.4 - 10.5 mg/dL 9.3  7.3  7.3     BNP No results found for: "BNP"  ProBNP No results found for: "PROBNP"  PFT    Component Value Date/Time   FEV1PRE 1.76 11/02/2021 0858   FEV1POST 1.76 11/02/2021 0858   FVCPRE 2.27 11/02/2021 0858   FVCPOST 2.24 11/02/2021 0858   TLC 5.57 11/02/2021 0858   DLCOUNC 20.33 11/02/2021 0858    PREFEV1FVCRT 77 11/02/2021 0858   PSTFEV1FVCRT 79 11/02/2021 0858    NM PET Image Initial (PI) Skull Base To Thigh Result Date: 07/16/2023 CLINICAL DATA:  Initial treatment strategy for lung nodule. EXAM: NUCLEAR MEDICINE PET SKULL BASE TO THIGH TECHNIQUE: 7.2 mCi F-18 FDG was injected intravenously. Full-ring PET imaging was performed from the skull base to thigh after the radiotracer. CT data was obtained and used for attenuation correction and anatomic localization. Fasting blood glucose: 126 mg/dl COMPARISON:  CT chest 69/62/9528, 03/14/2023. CT abdomen pelvis 12/29/2022. FINDINGS: Mediastinal blood pool activity: SUV max 2.5 Liver activity: SUV max NA NECK: No abnormal hypermetabolism. Incidental CT findings: None. CHEST: 7 mm posterior right lower lobe nodule (8/36), SUV max 2.8. 3 mm medial left lower lobe nodule (8/36), too small for PET resolution. No additional abnormal hypermetabolism. Incidental CT findings: 2.2 cm low-attenuation left thyroid nodule. Atherosclerotic calcification of the aorta and coronary arteries. Heart is at the upper limits of normal in size to mildly enlarged. No pericardial effusion. ABDOMEN/PELVIS: No abnormal hypermetabolism. Incidental CT findings: Cholecystectomy. Small right renal  stone. Atherosclerotic calcification of the aorta. SKELETON: No abnormal hypermetabolism. Incidental CT findings: Degenerative changes in the spine. IMPRESSION: 1. Hypermetabolic 7 mm posterior right lower lobe nodule, indicative of stage IA primary bronchogenic carcinoma. 2. Small right renal stone. 3. 2.2 cm low-attenuation left thyroid nodule. Previous thyroid ultrasound 07/11/2018. Consider repeat thyroid ultrasound, as clinically indicated. (Ref: J Am Coll Radiol. 2015 Feb;12(2): 143-50). 4. Aortic atherosclerosis (ICD10-I70.0). Coronary artery calcification. Electronically Signed   By: Leanna Battles M.D.   On: 07/16/2023 14:10     Past medical hx Past Medical History:  Diagnosis  Date   Anemia    Anxiety    Arthritis    Cataract    Chronic headaches    Depression    Diabetes mellitus    Diverticulosis of colon (without mention of hemorrhage) 2003, 2008   Colonoscopy   Eczema    Emphysema of lung (HCC)    GERD (gastroesophageal reflux disease)    Glaucoma    Hiatal hernia 2003   EGD    Hyperlipidemia    Hypertension    Thyroid nodule      Social History   Tobacco Use   Smoking status: Former    Current packs/day: 0.00    Average packs/day: 1.4 packs/day for 49.0 years (68.6 ttl pk-yrs)    Types: Cigarettes    Start date: 07/15/1966    Quit date: 07/16/2015    Years since quitting: 8.0    Passive exposure: Past   Smokeless tobacco: Never   Tobacco comments:    Continues to vape but states pod do not contain nicotine  Vaping Use   Vaping status: Former  Substance Use Topics   Alcohol use: Yes    Alcohol/week: 0.0 standard drinks of alcohol    Comment: once weekly; wine and beer   Drug use: No    AlyssaFord reports that she quit smoking about 8 years ago. Her smoking use included cigarettes. She started smoking about 57 years ago. She has a 68.6 pack-year smoking history. She has been exposed to tobacco smoke. She has never used smokeless tobacco. She reports current alcohol use. She reports that she does not use drugs.  Tobacco Cessation: Counseling given: Not Answered Tobacco comments: Continues to vape but states pod do not contain nicotine   Past surgical hx, Family hx, Social hx all reviewed.  Current Outpatient Medications on File Prior to Visit  Medication Sig   amitriptyline (ELAVIL) 25 MG tablet Take 2 tablets (50 mg total) by mouth daily at 12 noon.   aspirin EC 81 MG tablet Take 81 mg by mouth daily.   BIOTIN PO Take 1 tablet by mouth daily.    Coenzyme Q10 (CO Q 10 PO) Take 1 capsule by mouth daily.   fenofibrate 160 MG tablet Take 1 tablet (160 mg total) by mouth daily.   Ferrous Sulfate (IRON SUPPLEMENT PO) Take 1 tablet by  mouth every other day. 65mg    fluticasone (FLONASE) 50 MCG/ACT nasal spray Place 2 sprays into both nostrils daily. (Patient taking differently: Place 2 sprays into both nostrils daily as needed for allergies.)   ipratropium (ATROVENT) 0.06 % nasal spray Place 2 sprays into both nostrils 3 (three) times daily as needed for rhinitis.   latanoprost (XALATAN) 0.005 % ophthalmic solution Place 1 drop into both eyes at bedtime.    lisinopril (ZESTRIL) 5 MG tablet Take 1 tablet (5 mg total) by mouth daily.   metFORMIN (GLUCOPHAGE-XR) 500 MG 24 hr tablet TAKE 4 TABLETS  BY MOUTH EVERY DAY   montelukast (SINGULAIR) 10 MG tablet Take 1 tablet (10 mg total) by mouth at bedtime.   Naproxen Sodium (ALEVE PO) Take 2 tablets by mouth daily as needed (pain).   NONFORMULARY OR COMPOUNDED ITEM Cbd /delta 9 1 po qhs (Patient taking differently: Take 1 tablet by mouth at bedtime. Cbd /delta 9 1 po qhs)   omeprazole (PRILOSEC) 20 MG capsule Take 1 capsule (20 mg total) by mouth daily.   repaglinide (PRANDIN) 1 MG tablet Take 2 tablets (2 mg total) by mouth daily. Before evening meal.   rosuvastatin (CRESTOR) 40 MG tablet Take 1 tablet (40 mg total) by mouth at bedtime.   Semaglutide (RYBELSUS) 7 MG TABS Take 1 tablet (7 mg total) by mouth daily.   No current facility-administered medications on file prior to visit.     Allergies  Allergen Reactions   Codeine Nausea Only    Severe nausea   Augmentin [Amoxicillin-Pot Clavulanate]     Severe nausea and vomitting    Review Of Systems:  Constitutional:   No  weight loss, night sweats,  Fevers, chills, fatigue, or  lassitude.  HEENT:   No headaches,  Difficulty swallowing,  Tooth/dental problems, or  Sore throat,                No sneezing, itching, ear ache, nasal congestion, post nasal drip,   CV:  No chest pain,  Orthopnea, PND, swelling in lower extremities, anasarca, dizziness, palpitations, syncope.   GI  No heartburn, indigestion, abdominal pain,  nausea, vomiting, diarrhea, change in bowel habits, loss of appetite, bloody stools.   Resp: No shortness of breath with exertion or at rest.  No excess mucus, no productive cough,  No non-productive cough,  No coughing up of blood.  No change in color of mucus.  No wheezing.  No chest wall deformity  Skin: no rash or lesions.  GU: no dysuria, change in color of urine, no urgency or frequency.  No flank pain, no hematuria   MS:  No joint pain or swelling.  No decreased range of motion.  No back pain.  Psych:  No change in mood or affect. No depression or anxiety.  No memory loss.   Vital Signs BP (!) 140/64 (BP Location: Right Arm, Patient Position: Sitting, Cuff Size: Normal)   Pulse 88   Temp 97.8 F (36.6 C) (Oral)   Ht 5\' 6"  (1.676 m)   Wt 141 lb 12.8 oz (64.3 kg)   SpO2 98%   BMI 22.89 kg/m    Physical Exam:  General- No distress,  A&Ox3, pleasant, appropriately concerned ENT: No sinus tenderness, TM clear, pale nasal mucosa, no oral exudate,no post nasal drip, no LAN Cardiac: S1, S2, regular rate and rhythm, no murmur Chest: No wheeze/ rales/ dullness; no accessory muscle use, no nasal flaring, no sternal retractions Abd.: Soft Non-tender, ND, BS +, Body mass index is 22.89 kg/m.  Ext: No clubbing cyanosis, edema Neuro:  normal strength, ME x 4, A&O x 3 Skin: No rashes, warm and dry, No lesions  Psych: normal mood and behavior   Assessment/Plan Enlarging lobulated solid posterior right lower lobe pulmonary nodule measuring 6.7 mm in volume derived mean diameter, increased from 4.8 mm on 09/07/2022 screening chest CT Hypermetabolic 7 mm posterior right lower lobe nodule, indicative of stage IA primary bronchogenic carcinoma. Plan It is good to see you today. The PET scan we did show that the right lower lobe pulmonary nodules was  hypermetabolic , and concerning for a stage IA primary bronchogenic carcinoma. Plan will be for bronchoscopy with biopsies to determine  if this is an early stage cancer We have reviewed the risks of the procedure to include bleeding, infection, a collapse of the lung ( which can be immediately treated with insertion of a chest tube), and adverse reaction to anesthesia. You will get a letter today with all additional information regarding your bronchoscopy. We will call Dr. [name] and Dr. Barbie Haggis at [facility] to have them follow up on Thyroid finding.>> Thyroid US has been ordered You will get a letter today with all information about your procedure.  Hold aspirin 2 days  prior to  procedure. You will need someone to drive you to the hospital, stay during the procedure, and drive you haome, and be with you for 24 hours after the procedure.  You will follow up with me 1 week after the procedure to ensure you have done well after the procedure and go over biopsy results. Please contact office for sooner follow up if symptoms do not improve or worsen or seek emergency care   I spent 40 minutes dedicated to the care of this patient on the date of this encounter to include pre-visit review of records, face-to-face time with the patient discussing conditions above, post visit ordering of testing, clinical documentation with the electronic health record, making appropriate referrals as documented, and communicating necessary information to the patient's healthcare team.    Bevelyn Ngo, NP 07/23/2023  2:49 PM  Dear Alyssa Ford,  The following has been scheduled for you:   Procedure: Bronchoscopy                 Surgeon:  Dr.Byrum     Procedure date:  08/07/23 at 9:15am    Arrival Time:  at 6:45am Location: [facility] [address]tr[place], [place] [address]   Additional Information: Please stop the following medications:  Hold Aspirin for 2 days  Do not eat anything after midnight - ok to take medications on the day of procedure with only sips of water You will need someone to drive you home  after this procedure & to be with you for the next 24 hours You may receive a call from the hospital a day or two prior to procedure to go over additional instructions & medications  NEXT VISIT:  08/14/23 at 11am With Kandice Robinsons, NP at    [facility]    [address], [address] [place], [place]  [address]

## 2023-07-23 NOTE — Telephone Encounter (Signed)
Appt today 07/23/2023 to review results. Confirmed with patient.

## 2023-07-23 NOTE — H&P (View-Only) (Signed)
History of Present Illness Alyssa Ford is a 73 y.o. female former smoker  followed through the lung cancer screening program for abnormal imaging. She is here for consultation regarding an enlarging right lower lobe pulmonary nodule.   07/23/2023 Pt. Presents  follow up. She is followed through the lung cancer screening program. Initial scan low-dose screening CT 03/2023 showed an enlarging lobulated solid posterior right lower pulmonary node that measured 4.8 mm March 2024 and now measures 6.7 mm.  Additionally the patient has a new indistinct solid 10.2 mm left lower lobe pulmonary nodule that appears potentially inflammatory.  I spoke with the patient who said that in June she had a left lower lobe pneumonia.  She also had sepsis for this and was hospitalized for significant period of time. As there was potential that the imaging changes were due to infection/ inflammation , we opted for a 2 month follow up Ct Chest to re-evaluate. This was done 05/2023. Repeat imaging was still concerning and read as a LR 4 B. Pt was notified by phone, and plan was for a PET scan and follow up in the office after the PET scan had been read.  Pt. Is here today to review PET scan results. PET scan was positive for a hypermetabolic 7 mm RLL pulmonary nodule, and a 2.2 cm low attenuation left thyroid nodule. The thyroid nodule had been evaluated with [place] in 2020, but radiology is recommending repeat US, which we will ask PCP to order and follow.   We discussed next best steps, which I believe to be a bronchoscopy with biopsies.She is in agreement with this plan/  Test Results: PET Imaging 07/06/2023 Hypermetabolic 7 mm posterior right lower lobe nodule, indicative of stage IA primary bronchogenic carcinoma. 2. Small right renal stone. 3. 2.2 cm low-attenuation left thyroid nodule. Previous thyroid ultrasound 07/11/2018. Consider repeat thyroid ultrasound, as clinically indicated. (Ref: J Am Coll Radiol. 2015  Feb;12(2): 143-50). 4. Aortic atherosclerosis (ICD10-I70.0). Coronary artery calcification.  LDCT Chest 05/2023 Mediastinum/Nodes: No enlarged mediastinal, hilar, or axillary lymph nodes. The trachea and esophagus demonstrate no significant findings. Nodule within the right lobe of thyroid gland measures 1.9 x 1.2 cm, image 6/2. This has been evaluated on previous imaging. (ref: J Am Coll Radiol. 2015 Feb;12(2): 143-50).   Lungs/Pleura: Emphysema. Diffuse bronchial wall thickening. No pleural effusion, airspace consolidation, or pneumothorax. Patchy area of faint ground-glass attenuation and tree-in-bud nodularity noted within the anterior right lower lobe. This is new from the previous exam and most likely represents an inflammatory or infectious bronchiolitis. Nodule within the right lower lobe has a mean derived diameter of 8.3 mm. On the previous exam this measured 6.7 mm. On the more remote exam from 09/07/2022 this measured 4.8 mm and prior to that this measured 6.8 mm. The remaining scattered lung nodules noted previously are stable in the interval.   Upper Abdomen: No acute abnormality.  Cholecystectomy.   Musculoskeletal: No chest wall mass or suspicious bone lesions identified.   IMPRESSION: 1. Lung-RADS 4B, suspicious. Additional imaging evaluation or consultation with Pulmonology or Thoracic Surgery recommended. Right lower lobe, 8.4 mm, image 146/4.    03/2023 LDCT Chest Lungs/Pleura: No pneumothorax. No pleural effusion. Mild centrilobular emphysema with diffuse bronchial wall thickening. No acute consolidative airspace disease or lung masses. Lobulated solid posterior right lower lobe pulmonary nodule measures 6.7 mm in volume derived mean diameter (series 3/image 144), increased from 4.8 mm on 09/07/2022, although decreased from 6.8 mm on 06/08/2022 screening CT.  Other smaller pulmonary nodules are stable. New medial left lower lobe indistinct solid  pulmonary nodule measuring 10.2 mm in volume derived mean diameter (series 3/image 135), which appears somewhat bandlike on coronal reformats. No additional new significant pulmonary nodules.   Upper abdomen: Cholecystectomy.   Musculoskeletal: No aggressive appearing focal osseous lesions. Mild thoracic spondylosis.  1. Lung-RADS 4B, suspicious. Additional imaging evaluation or consultation with Pulmonology or Thoracic Surgery recommended. Enlarging lobulated solid posterior right lower lobe pulmonary nodule measuring 6.7 mm in volume derived mean diameter, increased from 4.8 mm on 09/07/2022 screening chest CT, waxing and waning over the previous several studies. New indistinct solid 10.2 mm medial left lower lobe pulmonary nodule, which appears somewhat bandlike on coronal reformats, potentially inflammatory. Follow up low-dose chest CT without contrast in 3 months      Latest Ref Rng & Units 02/12/2023    9:07 AM 01/15/2023   10:25 AM 12/29/2022    5:11 PM  CBC  WBC 4.0 - 10.5 K/uL 5.9  9.4  9.6   Hemoglobin 12.0 - 15.0 g/dL 40.9  81.1  91.4   Hematocrit 36.0 - 46.0 % 33.6  33.6  33.3   Platelets 150.0 - 400.0 K/uL 267.0  267.0  262        Latest Ref Rng & Units 02/12/2023    9:07 AM 12/29/2022    5:11 PM 12/29/2022    2:05 PM  BMP  Glucose 70 - 99 mg/dL 782  956  77   BUN 6 - 23 mg/dL 22  15  16    Creatinine 0.40 - 1.20 mg/dL 2.13  0.86  5.78   BUN/Creat Ratio 6 - 22 (calc)   16   Sodium 135 - 145 mEq/L 140  138  142   Potassium 3.5 - 5.1 mEq/L 4.0  3.7  3.9   Chloride 96 - 112 mEq/L 102  101  105   CO2 19 - 32 mEq/L 27  23  25    Calcium 8.4 - 10.5 mg/dL 9.3  7.3  7.3     BNP No results found for: "BNP"  ProBNP No results found for: "PROBNP"  PFT    Component Value Date/Time   FEV1PRE 1.76 11/02/2021 0858   FEV1POST 1.76 11/02/2021 0858   FVCPRE 2.27 11/02/2021 0858   FVCPOST 2.24 11/02/2021 0858   TLC 5.57 11/02/2021 0858   DLCOUNC 20.33 11/02/2021 0858    PREFEV1FVCRT 77 11/02/2021 0858   PSTFEV1FVCRT 79 11/02/2021 0858    NM PET Image Initial (PI) Skull Base To Thigh Result Date: 07/16/2023 CLINICAL DATA:  Initial treatment strategy for lung nodule. EXAM: NUCLEAR MEDICINE PET SKULL BASE TO THIGH TECHNIQUE: 7.2 mCi F-18 FDG was injected intravenously. Full-ring PET imaging was performed from the skull base to thigh after the radiotracer. CT data was obtained and used for attenuation correction and anatomic localization. Fasting blood glucose: 126 mg/dl COMPARISON:  CT chest 46/96/2952, 03/14/2023. CT abdomen pelvis 12/29/2022. FINDINGS: Mediastinal blood pool activity: SUV max 2.5 Liver activity: SUV max NA NECK: No abnormal hypermetabolism. Incidental CT findings: None. CHEST: 7 mm posterior right lower lobe nodule (8/36), SUV max 2.8. 3 mm medial left lower lobe nodule (8/36), too small for PET resolution. No additional abnormal hypermetabolism. Incidental CT findings: 2.2 cm low-attenuation left thyroid nodule. Atherosclerotic calcification of the aorta and coronary arteries. Heart is at the upper limits of normal in size to mildly enlarged. No pericardial effusion. ABDOMEN/PELVIS: No abnormal hypermetabolism. Incidental CT findings: Cholecystectomy. Small right renal  stone. Atherosclerotic calcification of the aorta. SKELETON: No abnormal hypermetabolism. Incidental CT findings: Degenerative changes in the spine. IMPRESSION: 1. Hypermetabolic 7 mm posterior right lower lobe nodule, indicative of stage IA primary bronchogenic carcinoma. 2. Small right renal stone. 3. 2.2 cm low-attenuation left thyroid nodule. Previous thyroid ultrasound 07/11/2018. Consider repeat thyroid ultrasound, as clinically indicated. (Ref: J Am Coll Radiol. 2015 Feb;12(2): 143-50). 4. Aortic atherosclerosis (ICD10-I70.0). Coronary artery calcification. Electronically Signed   By: Leanna Battles M.D.   On: 07/16/2023 14:10     Past medical hx Past Medical History:  Diagnosis  Date   Anemia    Anxiety    Arthritis    Cataract    Chronic headaches    Depression    Diabetes mellitus    Diverticulosis of colon (without mention of hemorrhage) 2003, 2008   Colonoscopy   Eczema    Emphysema of lung (HCC)    GERD (gastroesophageal reflux disease)    Glaucoma    Hiatal hernia 2003   EGD    Hyperlipidemia    Hypertension    Thyroid nodule      Social History   Tobacco Use   Smoking status: Former    Current packs/day: 0.00    Average packs/day: 1.4 packs/day for 49.0 years (68.6 ttl pk-yrs)    Types: Cigarettes    Start date: 07/15/1966    Quit date: 07/16/2015    Years since quitting: 8.0    Passive exposure: Past   Smokeless tobacco: Never   Tobacco comments:    Continues to vape but states pod do not contain nicotine  Vaping Use   Vaping status: Former  Substance Use Topics   Alcohol use: Yes    Alcohol/week: 0.0 standard drinks of alcohol    Comment: once weekly; wine and beer   Drug use: No    Ms.Voorheis reports that she quit smoking about 8 years ago. Her smoking use included cigarettes. She started smoking about 57 years ago. She has a 68.6 pack-year smoking history. She has been exposed to tobacco smoke. She has never used smokeless tobacco. She reports current alcohol use. She reports that she does not use drugs.  Tobacco Cessation: Counseling given: Not Answered Tobacco comments: Continues to vape but states pod do not contain nicotine   Past surgical hx, Family hx, Social hx all reviewed.  Current Outpatient Medications on File Prior to Visit  Medication Sig   amitriptyline (ELAVIL) 25 MG tablet Take 2 tablets (50 mg total) by mouth daily at 12 noon.   aspirin EC 81 MG tablet Take 81 mg by mouth daily.   BIOTIN PO Take 1 tablet by mouth daily.    Coenzyme Q10 (CO Q 10 PO) Take 1 capsule by mouth daily.   fenofibrate 160 MG tablet Take 1 tablet (160 mg total) by mouth daily.   Ferrous Sulfate (IRON SUPPLEMENT PO) Take 1 tablet by  mouth every other day. 65mg    fluticasone (FLONASE) 50 MCG/ACT nasal spray Place 2 sprays into both nostrils daily. (Patient taking differently: Place 2 sprays into both nostrils daily as needed for allergies.)   ipratropium (ATROVENT) 0.06 % nasal spray Place 2 sprays into both nostrils 3 (three) times daily as needed for rhinitis.   latanoprost (XALATAN) 0.005 % ophthalmic solution Place 1 drop into both eyes at bedtime.    lisinopril (ZESTRIL) 5 MG tablet Take 1 tablet (5 mg total) by mouth daily.   metFORMIN (GLUCOPHAGE-XR) 500 MG 24 hr tablet TAKE 4 TABLETS  BY MOUTH EVERY DAY   montelukast (SINGULAIR) 10 MG tablet Take 1 tablet (10 mg total) by mouth at bedtime.   Naproxen Sodium (ALEVE PO) Take 2 tablets by mouth daily as needed (pain).   NONFORMULARY OR COMPOUNDED ITEM Cbd /delta 9 1 po qhs (Patient taking differently: Take 1 tablet by mouth at bedtime. Cbd /delta 9 1 po qhs)   omeprazole (PRILOSEC) 20 MG capsule Take 1 capsule (20 mg total) by mouth daily.   repaglinide (PRANDIN) 1 MG tablet Take 2 tablets (2 mg total) by mouth daily. Before evening meal.   rosuvastatin (CRESTOR) 40 MG tablet Take 1 tablet (40 mg total) by mouth at bedtime.   Semaglutide (RYBELSUS) 7 MG TABS Take 1 tablet (7 mg total) by mouth daily.   No current facility-administered medications on file prior to visit.     Allergies  Allergen Reactions   Codeine Nausea Only    Severe nausea   Augmentin [Amoxicillin-Pot Clavulanate]     Severe nausea and vomitting    Review Of Systems:  Constitutional:   No  weight loss, night sweats,  Fevers, chills, fatigue, or  lassitude.  HEENT:   No headaches,  Difficulty swallowing,  Tooth/dental problems, or  Sore throat,                No sneezing, itching, ear ache, nasal congestion, post nasal drip,   CV:  No chest pain,  Orthopnea, PND, swelling in lower extremities, anasarca, dizziness, palpitations, syncope.   GI  No heartburn, indigestion, abdominal pain,  nausea, vomiting, diarrhea, change in bowel habits, loss of appetite, bloody stools.   Resp: No shortness of breath with exertion or at rest.  No excess mucus, no productive cough,  No non-productive cough,  No coughing up of blood.  No change in color of mucus.  No wheezing.  No chest wall deformity  Skin: no rash or lesions.  GU: no dysuria, change in color of urine, no urgency or frequency.  No flank pain, no hematuria   MS:  No joint pain or swelling.  No decreased range of motion.  No back pain.  Psych:  No change in mood or affect. No depression or anxiety.  No memory loss.   Vital Signs BP (!) 140/64 (BP Location: Right Arm, Patient Position: Sitting, Cuff Size: Normal)   Pulse 88   Temp 97.8 F (36.6 C) (Oral)   Ht 5\' 6"  (1.676 m)   Wt 141 lb 12.8 oz (64.3 kg)   SpO2 98%   BMI 22.89 kg/m    Physical Exam:  General- No distress,  A&Ox3, pleasant, appropriately concerned ENT: No sinus tenderness, TM clear, pale nasal mucosa, no oral exudate,no post nasal drip, no LAN Cardiac: S1, S2, regular rate and rhythm, no murmur Chest: No wheeze/ rales/ dullness; no accessory muscle use, no nasal flaring, no sternal retractions Abd.: Soft Non-tender, ND, BS +, Body mass index is 22.89 kg/m.  Ext: No clubbing cyanosis, edema Neuro:  normal strength, ME x 4, A&O x 3 Skin: No rashes, warm and dry, No lesions  Psych: normal mood and behavior   Assessment/Plan Enlarging lobulated solid posterior right lower lobe pulmonary nodule measuring 6.7 mm in volume derived mean diameter, increased from 4.8 mm on 09/07/2022 screening chest CT Hypermetabolic 7 mm posterior right lower lobe nodule, indicative of stage IA primary bronchogenic carcinoma. Plan It is good to see you today. The PET scan we did show that the right lower lobe pulmonary nodules was  hypermetabolic , and concerning for a stage IA primary bronchogenic carcinoma. Plan will be for bronchoscopy with biopsies to determine  if this is an early stage cancer We have reviewed the risks of the procedure to include bleeding, infection, a collapse of the lung ( which can be immediately treated with insertion of a chest tube), and adverse reaction to anesthesia. You will get a letter today with all additional information regarding your bronchoscopy. We will call Dr. [name] and Dr. Barbie Haggis at [facility] to have them follow up on Thyroid finding.>> Thyroid US has been ordered You will get a letter today with all information about your procedure.  Hold aspirin 2 days  prior to  procedure. You will need someone to drive you to the hospital, stay during the procedure, and drive you haome, and be with you for 24 hours after the procedure.  You will follow up with me 1 week after the procedure to ensure you have done well after the procedure and go over biopsy results. Please contact office for sooner follow up if symptoms do not improve or worsen or seek emergency care   I spent 40 minutes dedicated to the care of this patient on the date of this encounter to include pre-visit review of records, face-to-face time with the patient discussing conditions above, post visit ordering of testing, clinical documentation with the electronic health record, making appropriate referrals as documented, and communicating necessary information to the patient's healthcare team.    Bevelyn Ngo, NP 07/23/2023  2:49 PM  Dear Ms. Dortch,  The following has been scheduled for you:   Procedure: Bronchoscopy                 Surgeon:  Dr.Byrum     Procedure date:  08/07/23 at 9:15am    Arrival Time:  at 6:45am Location: [facility] [address], Entr[address] [place], [place] [address]   Additional Information: Please stop the following medications:  Hold Aspirin for 2 days  Do not eat anything after midnight - ok to take medications on the day of procedure with only sips of water You will need someone to drive you home  after this procedure & to be with you for the next 24 hours You may receive a call from the hospital a day or two prior to procedure to go over additional instructions & medications  NEXT VISIT:  08/14/23 at 11am With Kandice Robinsons, NP at    [facility]    [address], [address] [place], [place]  [address]

## 2023-07-24 ENCOUNTER — Institutional Professional Consult (permissible substitution): Payer: Medicare Other | Admitting: Acute Care

## 2023-07-25 ENCOUNTER — Ambulatory Visit (HOSPITAL_BASED_OUTPATIENT_CLINIC_OR_DEPARTMENT_OTHER)
Admission: RE | Admit: 2023-07-25 | Discharge: 2023-07-25 | Disposition: A | Discharge status: Home / Self Care | Payer: Medicare Other | Source: Ambulatory Visit | Attending: Family Medicine | Admitting: Family Medicine

## 2023-07-25 DIAGNOSIS — E041 Nontoxic single thyroid nodule: Secondary | ICD-10-CM | POA: Insufficient documentation

## 2023-07-29 ENCOUNTER — Other Ambulatory Visit: Payer: Self-pay | Admitting: Family Medicine

## 2023-07-29 ENCOUNTER — Encounter: Payer: Self-pay | Admitting: Acute Care

## 2023-07-29 DIAGNOSIS — E1169 Type 2 diabetes mellitus with other specified complication: Secondary | ICD-10-CM

## 2023-07-30 ENCOUNTER — Encounter: Payer: Self-pay | Admitting: Family Medicine

## 2023-08-02 ENCOUNTER — Ambulatory Visit: Payer: Medicare Other | Admitting: Family Medicine

## 2023-08-02 ENCOUNTER — Encounter: Payer: Self-pay | Admitting: Family Medicine

## 2023-08-02 VITALS — BP 160/68 | HR 72 | Temp 98.1°F | Resp 18 | Ht 66.0 in | Wt 139.6 lb

## 2023-08-02 DIAGNOSIS — E1169 Type 2 diabetes mellitus with other specified complication: Secondary | ICD-10-CM

## 2023-08-02 DIAGNOSIS — E118 Type 2 diabetes mellitus with unspecified complications: Secondary | ICD-10-CM

## 2023-08-02 DIAGNOSIS — S0300XD Dislocation of jaw, unspecified side, subsequent encounter: Secondary | ICD-10-CM | POA: Diagnosis not present

## 2023-08-02 DIAGNOSIS — Z7984 Long term (current) use of oral hypoglycemic drugs: Secondary | ICD-10-CM | POA: Diagnosis not present

## 2023-08-02 DIAGNOSIS — J309 Allergic rhinitis, unspecified: Secondary | ICD-10-CM | POA: Diagnosis not present

## 2023-08-02 DIAGNOSIS — I10 Essential (primary) hypertension: Secondary | ICD-10-CM

## 2023-08-02 DIAGNOSIS — E119 Type 2 diabetes mellitus without complications: Secondary | ICD-10-CM

## 2023-08-02 DIAGNOSIS — K219 Gastro-esophageal reflux disease without esophagitis: Secondary | ICD-10-CM

## 2023-08-02 DIAGNOSIS — E1165 Type 2 diabetes mellitus with hyperglycemia: Secondary | ICD-10-CM | POA: Diagnosis not present

## 2023-08-02 DIAGNOSIS — I700 Atherosclerosis of aorta: Secondary | ICD-10-CM

## 2023-08-02 DIAGNOSIS — E785 Hyperlipidemia, unspecified: Secondary | ICD-10-CM | POA: Diagnosis not present

## 2023-08-02 DIAGNOSIS — E039 Hypothyroidism, unspecified: Secondary | ICD-10-CM

## 2023-08-02 LAB — CBC WITH DIFFERENTIAL/PLATELET
Basophils Absolute: 0 10*3/uL (ref 0.0–0.1)
Basophils Relative: 0.5 % (ref 0.0–3.0)
Eosinophils Absolute: 0.3 10*3/uL (ref 0.0–0.7)
Eosinophils Relative: 3.4 % (ref 0.0–5.0)
HCT: 35.7 % — ABNORMAL LOW (ref 36.0–46.0)
Hemoglobin: 11.6 g/dL — ABNORMAL LOW (ref 12.0–15.0)
Lymphocytes Relative: 22 % (ref 12.0–46.0)
Lymphs Abs: 1.6 10*3/uL (ref 0.7–4.0)
MCHC: 32.5 g/dL (ref 30.0–36.0)
MCV: 84.9 fL (ref 78.0–100.0)
Monocytes Absolute: 0.6 10*3/uL (ref 0.1–1.0)
Monocytes Relative: 7.7 % (ref 3.0–12.0)
Neutro Abs: 4.9 10*3/uL (ref 1.4–7.7)
Neutrophils Relative %: 66.4 % (ref 43.0–77.0)
Platelets: 305 10*3/uL (ref 150.0–400.0)
RBC: 4.2 Mil/uL (ref 3.87–5.11)
RDW: 14.7 % (ref 11.5–15.5)
WBC: 7.5 10*3/uL (ref 4.0–10.5)

## 2023-08-02 LAB — LIPID PANEL
Cholesterol: 112 mg/dL (ref 0–200)
HDL: 48.2 mg/dL (ref >39.00)
LDL Cholesterol: 47 mg/dL (ref 0–99)
NonHDL: 63.99
Total CHOL/HDL Ratio: 2
Triglycerides: 83 mg/dL (ref 0.0–149.0)
VLDL: 16.6 mg/dL (ref 0.0–40.0)

## 2023-08-02 LAB — COMPREHENSIVE METABOLIC PANEL
ALT: 10 U/L (ref 0–35)
AST: 14 U/L (ref 0–37)
Albumin: 4.4 g/dL (ref 3.5–5.2)
Alkaline Phosphatase: 51 U/L (ref 39–117)
BUN: 17 mg/dL (ref 6–23)
CO2: 28 meq/L (ref 19–32)
Calcium: 8.6 mg/dL (ref 8.4–10.5)
Chloride: 99 meq/L (ref 96–112)
Creatinine, Ser: 0.9 mg/dL (ref 0.40–1.20)
GFR: 63.48 mL/min (ref >60.00)
Glucose, Bld: 121 mg/dL — ABNORMAL HIGH (ref 70–99)
Potassium: 3.4 meq/L — ABNORMAL LOW (ref 3.5–5.1)
Sodium: 140 meq/L (ref 135–145)
Total Bilirubin: 0.4 mg/dL (ref 0.2–1.2)
Total Protein: 7.2 g/dL (ref 6.0–8.3)

## 2023-08-02 LAB — TSH: TSH: 4.2 u[IU]/mL (ref 0.35–5.50)

## 2023-08-02 LAB — HEMOGLOBIN A1C: Hgb A1c MFr Bld: 6.6 % — ABNORMAL HIGH (ref 4.6–6.5)

## 2023-08-02 MED ORDER — METFORMIN HCL ER 500 MG PO TB24: ORAL_TABLET | ORAL | 1 refills | Status: DC

## 2023-08-02 MED ORDER — LISINOPRIL 5 MG PO TABS: 5.0000 mg | ORAL_TABLET | Freq: Every day | ORAL | 1 refills | Status: AC

## 2023-08-02 MED ORDER — IPRATROPIUM BROMIDE 0.06 % NA SOLN: 2.0000 | Freq: Three times a day (TID) | NASAL | 5 refills | Status: DC | PRN

## 2023-08-02 MED ORDER — AMITRIPTYLINE HCL 25 MG PO TABS: 50.0000 mg | ORAL_TABLET | Freq: Every day | ORAL | 1 refills | Status: DC

## 2023-08-02 MED ORDER — MONTELUKAST SODIUM 10 MG PO TABS: 10.0000 mg | ORAL_TABLET | Freq: Every day | ORAL | 3 refills | Status: DC

## 2023-08-02 MED ORDER — OMEPRAZOLE 20 MG PO CPDR: 20.0000 mg | DELAYED_RELEASE_CAPSULE | Freq: Every day | ORAL | 3 refills | Status: DC

## 2023-08-02 MED ORDER — ROSUVASTATIN CALCIUM 40 MG PO TABS: 40.0000 mg | ORAL_TABLET | Freq: Every day | ORAL | 1 refills | Status: DC

## 2023-08-02 NOTE — Assessment & Plan Note (Signed)
 Aspirin  daily  Pt is on statin

## 2023-08-02 NOTE — Assessment & Plan Note (Signed)
 [id number] to be checked minimize simple carbs. Increase exercise as tolerated. Continue current meds

## 2023-08-02 NOTE — Progress Notes (Signed)
 Established Patient Office Visit  Subjective   Patient ID: Alyssa Ford, female    DOB: Jul 05, 1950  Age: 73 y.o. MRN: [id number]  Chief Complaint  Patient presents with   Diabetes   Follow-up    HPI Discussed the use of AI scribe software for clinical note transcription with the patient, who gave verbal consent to proceed.  History of Present Illness   Alyssa Ford is a 73 year old female who presents for blood sugar monitoring.  She monitors her blood sugar levels, which have been stable, typically ranging between 154 and 160 mg/dL. She is currently taking Prandin  (repaglinide ) and Rybelsus  for diabetes management. She has enough lancets and strips for monitoring.  She is scheduled for a lung biopsy on Tuesday due to a nodule that was found and has grown. She has not yet met the doctor performing the procedure.  She is currently taking aspirin  and Crestor  for aortic atherosclerosis and coronary artery calcification. She inquires about the need to see a cardiologist again, mentioning a CT scan from a couple of years ago.  She takes amitriptyline  for jaw pain associated with TMJ.  She uses ipratropium nasal spray, which she finds more effective than Flonase , and takes Singulair  for allergies.  She is on omeprazole , which she takes regularly for GERD management.       Patient Active Problem List   Diagnosis Date Noted   Chronic rhinitis 04/03/2023   Nausea 01/15/2023   Iron deficiency anemia 01/15/2023   Hypokalemia 12/14/2022   Community acquired pneumonia of left lower lobe of lung 11/26/2022   Septic shock (HCC) 11/26/2022   Slow transit constipation 08/18/2022   Preventative health care 02/05/2022   Pulmonary HTN (HCC) 07/04/2021   Hyperlipidemia associated with type 2 diabetes mellitus (HCC) 07/04/2021   Atherosclerosis of aorta (HCC) 07/04/2021   Angioma of skin 06/02/2021   Vaginal itching 05/23/2021   Hematuria 05/23/2021   Insomnia 05/13/2021    Hypothyroidism 08/24/2020   Chest pain 08/06/2020   Trichomoniasis 04/27/2020   Cervical cancer screening 04/27/2020   Left thyroid  nodule 04/13/2020   Vaginal odor 01/11/2020   Icterus 01/11/2020   Controlled type 2 diabetes mellitus with hyperglycemia, without long-term current use of insulin  (HCC) 09/22/2019   De Quervain's disease (tenosynovitis) 09/22/2019   Neck mass 07/11/2018   Abnormal WBC count 05/29/2017   Skin fissure 09/24/2015   Allergic rhinitis 03/26/2014   Wheezing 03/26/2014   DM (diabetes mellitus) type II uncontrolled, periph vascular disorder 08/25/2011   Headache 08/21/2011   Acute sinusitis 04/26/2010   URI 04/26/2010   UNSPECIFIED CATARACT 02/23/2010   POSTMENOPAUSAL STATUS 12/12/2007   FATIGUE 09/11/2007   THYROID  NODULE 11/22/2006   Diabetes (HCC) 11/22/2006   Hyperlipidemia LDL goal <70 11/22/2006   GLUCOMA 11/22/2006   Essential hypertension 11/22/2006   GERD 11/22/2006   Past Medical History:  Diagnosis Date   Anemia    Anxiety    Arthritis    Cataract    Chronic headaches    Depression    Diabetes mellitus    Diverticulosis of colon (without mention of hemorrhage) 2003, 2008   Colonoscopy   Eczema    Emphysema of lung (HCC)    GERD (gastroesophageal reflux disease)    Glaucoma    Hiatal hernia 2003   EGD    Hyperlipidemia    Hypertension    Thyroid  nodule    Past Surgical History:  Procedure Laterality Date   CARPAL TUNNEL RELEASE  2006  CARPAL TUNNEL RELEASE  12/24/2007   CATARACT EXTRACTION, BILATERAL     [facility]---  sept and oct 2018   CERVICAL FUSION  08/2006   CHOLECYSTECTOMY     COSMETIC SURGERY  December 09 2018   EYE SURGERY  catracts   FASCIECTOMY Left 08/01/2018   Procedure: FASCIECTOMY LEFT RING;  Surgeon: Murrell Kuba, MD;  Location: [facility];  Service: Orthopedics;  Laterality: Left;  UPPER ARM   SPINE SURGERY  neck dic replaced   TRIGGER FINGER RELEASE Left 08/01/2018   Procedure: RELEASE TRIGGER  FINGER/A-1 PULLEY LEFTRING;  Surgeon: Murrell Kuba, MD;  Location: [facility];  Service: Orthopedics;  Laterality: Left;   TUBAL LIGATION     Social History   Tobacco Use   Smoking status: Former    Current packs/day: 0.00    Average packs/day: 1.4 packs/day for 49.0 years (68.6 ttl pk-yrs)    Types: Cigarettes    Start date: 07/15/1966    Quit date: 07/16/2015    Years since quitting: 8.0    Passive exposure: Past   Smokeless tobacco: Never   Tobacco comments:    Continues to vape but states pod do not contain nicotine  Vaping Use   Vaping status: Former  Substance Use Topics   Alcohol use: Yes    Alcohol/week: 0.0 standard drinks of alcohol    Comment: once weekly; wine and beer   Drug use: No   Social History   Socioeconomic History   Marital status: Married    Spouse name: Not on file   Number of children: Not on file   Years of education: Not on file   Highest education level: Some college, no degree  Occupational History   Occupation: retired  Tobacco Use   Smoking status: Former    Current packs/day: 0.00    Average packs/day: 1.4 packs/day for 49.0 years (68.6 ttl pk-yrs)    Types: Cigarettes    Start date: 07/15/1966    Quit date: 07/16/2015    Years since quitting: 8.0    Passive exposure: Past   Smokeless tobacco: Never   Tobacco comments:    Continues to vape but states pod do not contain nicotine  Vaping Use   Vaping status: Former  Substance and Sexual Activity   Alcohol use: Yes    Alcohol/week: 0.0 standard drinks of alcohol    Comment: once weekly; wine and beer   Drug use: No   Sexual activity: Not Currently    Partners: Male  Other Topics Concern   Not on file  Social History Narrative   Exercise--  walk   Social Drivers of Health   Financial Resource Strain: Low Risk  (07/26/2023)   Overall Financial Resource Strain (CARDIA)    Difficulty of Paying Living Expenses: Not very hard  Food Insecurity: No Food Insecurity  (07/26/2023)   Hunger Vital Sign    Worried About Running Out of Food in the Last Year: Never true    Ran Out of Food in the Last Year: Never true  Transportation Needs: No Transportation Needs (07/26/2023)   PRAPARE - Administrator, Civil Service (Medical): No    Lack of Transportation (Non-Medical): No  Physical Activity: Insufficiently Active (07/26/2023)   Exercise Vital Sign    Days of Exercise per Week: 4 days    Minutes of Exercise per Session: 30 min  Stress: Stress Concern Present (07/26/2023)   [employer] of Occupational Health - Occupational Stress Questionnaire  Feeling of Stress : To some extent  Social Connections: Unknown (07/26/2023)   Social Connection and Isolation Panel [NHANES]    Frequency of Communication with Friends and Family: More than three times a week    Frequency of Social Gatherings with Friends and Family: Twice a week    Attends Religious Services: Patient declined    Database Administrator or Organizations: No    Attends Banker Meetings: Never    Marital Status: Married  Catering Manager Violence: Not At Risk (02/08/2023)   Humiliation, Afraid, Rape, and Kick questionnaire    Fear of Current or Ex-Partner: No    Emotionally Abused: No    Physically Abused: No    Sexually Abused: No   Family Status  Relation Name Status   Mother  Deceased   Father  Deceased   Sister  Alive   Sister  Alive   Brother  Deceased at age 49       MI   Mat Aunt  Deceased at age 74   Cousin  Alive   Neg Hx  (Not Specified)  No partnership data on file   Family History  Problem Relation Age of Onset   Angioedema Mother    Dementia Mother    Breast cancer Mother 51   Kidney failure Mother 57       died from kidney failure from abx given to her for pneumonia   Hyperlipidemia Mother    Hypertension Mother    Diabetes Mother    Cancer Mother 73       breast   Parkinsonism Father    Diabetes Father 8   Hypertension Sister     Diabetes Brother    Hypertension Brother    Heart attack Brother    Heart disease Cousin 51       mi   Colon cancer Neg Hx    Esophageal cancer Neg Hx    Stomach cancer Neg Hx    Allergies  Allergen Reactions   Codeine Nausea Only    Severe nausea   Augmentin  [Amoxicillin -Pot Clavulanate]     Severe nausea and vomitting      Review of Systems  Constitutional:  Negative for chills, fever and malaise/fatigue.  HENT:  Negative for congestion and hearing loss.   Eyes:  Negative for blurred vision and discharge.  Respiratory:  Negative for cough, sputum production and shortness of breath.   Cardiovascular:  Negative for chest pain, palpitations and leg swelling.  Gastrointestinal:  Negative for abdominal pain, blood in stool, constipation, diarrhea, heartburn, nausea and vomiting.  Genitourinary:  Negative for dysuria, frequency, hematuria and urgency.  Musculoskeletal:  Negative for back pain, falls and myalgias.  Skin:  Negative for rash.  Neurological:  Negative for dizziness, sensory change, loss of consciousness, weakness and headaches.  Endo/Heme/Allergies:  Negative for environmental allergies. Does not bruise/bleed easily.  Psychiatric/Behavioral:  Negative for depression and suicidal ideas. The patient is not nervous/anxious and does not have insomnia.       Objective:     BP (!) 160/68 (BP Location: Left Arm, Patient Position: Sitting, Cuff Size: Normal)   Pulse 72   Temp 98.1 F (36.7 C) (Oral)   Resp 18   Ht 5' 6 (1.676 m)   Wt 139 lb 9.6 oz (63.3 kg)   SpO2 97%   BMI 22.53 kg/m  BP Readings from Last 3 Encounters:  08/02/23 (!) 160/68  07/23/23 (!) 140/64  04/03/23 136/71   Wt Readings  from Last 3 Encounters:  08/02/23 139 lb 9.6 oz (63.3 kg)  07/23/23 141 lb 12.8 oz (64.3 kg)  04/03/23 144 lb (65.3 kg)   SpO2 Readings from Last 3 Encounters:  08/02/23 97%  07/23/23 98%  04/03/23 100%      Physical Exam Vitals and nursing note reviewed.   Constitutional:      General: She is not in acute distress.    Appearance: Normal appearance. She is well-developed.  HENT:     Head: Normocephalic and atraumatic.     Right Ear: Tympanic membrane, ear canal and external ear normal. There is no impacted cerumen.     Left Ear: Tympanic membrane, ear canal and external ear normal. There is no impacted cerumen.     Nose: Nose normal.     Mouth/Throat:     Mouth: Mucous membranes are moist.     Pharynx: Oropharynx is clear. No oropharyngeal exudate or posterior oropharyngeal erythema.  Eyes:     General: No scleral icterus.       Right eye: No discharge.        Left eye: No discharge.     Conjunctiva/sclera: Conjunctivae normal.     Pupils: Pupils are equal, round, and reactive to light.  Neck:     Thyroid : No thyromegaly or thyroid  tenderness.     Vascular: No JVD.  Cardiovascular:     Rate and Rhythm: Normal rate and regular rhythm.     Heart sounds: Normal heart sounds. No murmur heard. Pulmonary:     Effort: Pulmonary effort is normal. No respiratory distress.     Breath sounds: Normal breath sounds.  Abdominal:     General: Bowel sounds are normal. There is no distension.     Palpations: Abdomen is soft. There is no mass.     Tenderness: There is no abdominal tenderness. There is no guarding or rebound.  Genitourinary:    Vagina: Normal.  Musculoskeletal:        General: Normal range of motion.     Cervical back: Normal range of motion and neck supple.     Right lower leg: No edema.     Left lower leg: No edema.  Lymphadenopathy:     Cervical: No cervical adenopathy.  Skin:    General: Skin is warm and dry.     Findings: No erythema or rash.  Neurological:     Mental Status: She is alert and oriented to person, place, and time.     Cranial Nerves: No cranial nerve deficit.     Deep Tendon Reflexes: Reflexes are normal and symmetric.  Psychiatric:        Mood and Affect: Mood normal.        Behavior: Behavior normal.         Thought Content: Thought content normal.        Judgment: Judgment normal.      No results found for any visits on 08/02/23.  Last CBC Lab Results  Component Value Date   WBC 5.9 02/12/2023   HGB 10.7 (L) 02/12/2023   HCT 33.6 (L) 02/12/2023   MCV 87.6 02/12/2023   MCH 28.6 12/29/2022   RDW 14.9 02/12/2023   PLT 267.0 02/12/2023   Last metabolic panel Lab Results  Component Value Date   GLUCOSE 127 (H) 02/12/2023   NA 140 02/12/2023   K 4.0 02/12/2023   CL 102 02/12/2023   CO2 27 02/12/2023   BUN 22 02/12/2023   CREATININE 1.05 02/12/2023  GFR 52.94 (L) 02/12/2023   CALCIUM  9.3 02/12/2023   PHOS 8.7 (H) 11/27/2022   PROT 6.6 02/12/2023   ALBUMIN 4.1 02/12/2023   BILITOT 0.3 02/12/2023   ALKPHOS 38 (L) 02/12/2023   AST 15 02/12/2023   ALT 14 02/12/2023   ANIONGAP 14 12/29/2022   Last lipids Lab Results  Component Value Date   CHOL 126 02/12/2023   HDL 43.50 02/12/2023   LDLCALC 54 02/12/2023   LDLDIRECT 87.0 06/07/2020   TRIG 142.0 02/12/2023   CHOLHDL 3 02/12/2023   Last hemoglobin A1c Lab Results  Component Value Date   HGBA1C 6.4 02/12/2023   Last thyroid  functions Lab Results  Component Value Date   TSH 2.90 02/12/2023   Last vitamin D Lab Results  Component Value Date   VD25OH 43 02/23/2010   Last vitamin B12 and Folate Lab Results  Component Value Date   VITAMINB12 1,337 (H) 04/17/2019   FOLATE 6.7 09/11/2007      The ASCVD Risk score (Arnett DK, et al., 2019) failed to calculate for the following reasons:   The valid total cholesterol range is 130 to 320 mg/dL    Assessment & Plan:   Problem List Items Addressed This Visit       Unprioritized   GERD   Relevant Medications   omeprazole  (PRILOSEC) 20 MG capsule   Hypothyroidism - Primary   Relevant Medications   ipratropium (ATROVENT ) 0.06 % nasal spray   Other Relevant Orders   TSH   Allergic rhinitis   Relevant Medications   montelukast  (SINGULAIR ) 10 MG  tablet   Hyperlipidemia associated with type 2 diabetes mellitus (HCC)   Tolerating statin, encouraged heart healthy diet, avoid trans fats, minimize simple carbs and saturated fats. Increase exercise as tolerated       Relevant Medications   lisinopril  (ZESTRIL ) 5 MG tablet   metFORMIN  (GLUCOPHAGE -XR) 500 MG 24 hr tablet   rosuvastatin  (CRESTOR ) 40 MG tablet   Other Relevant Orders   Comprehensive metabolic panel   Essential hypertension   Well controlled, no changes to meds. Encouraged heart healthy diet such as the DASH diet and exercise as tolerated.        Relevant Medications   lisinopril  (ZESTRIL ) 5 MG tablet   rosuvastatin  (CRESTOR ) 40 MG tablet   Controlled type 2 diabetes mellitus with hyperglycemia, without long-term current use of insulin  (HCC)   hgba1c to be checked  minimize simple carbs. Increase exercise as tolerated. Continue current meds       Relevant Medications   lisinopril  (ZESTRIL ) 5 MG tablet   metFORMIN  (GLUCOPHAGE -XR) 500 MG 24 hr tablet   rosuvastatin  (CRESTOR ) 40 MG tablet   Atherosclerosis of aorta (HCC)   Aspirin  daily  Pt is on statin      Relevant Medications   lisinopril  (ZESTRIL ) 5 MG tablet   rosuvastatin  (CRESTOR ) 40 MG tablet   Other Visit Diagnoses       Controlled type 2 diabetes mellitus with complication, without long-term current use of insulin  (HCC)       Relevant Medications   lisinopril  (ZESTRIL ) 5 MG tablet   metFORMIN  (GLUCOPHAGE -XR) 500 MG 24 hr tablet   rosuvastatin  (CRESTOR ) 40 MG tablet   Other Relevant Orders   Hemoglobin A1c     Controlled type 2 diabetes mellitus without complication, without long-term current use of insulin  (HCC)       Relevant Medications   lisinopril  (ZESTRIL ) 5 MG tablet   metFORMIN  (GLUCOPHAGE -XR) 500 MG 24 hr tablet  rosuvastatin  (CRESTOR ) 40 MG tablet   Other Relevant Orders   CBC with Differential/Platelet   Comprehensive metabolic panel   Lipid panel   Hemoglobin A1c     Dislocation  of temporomandibular joint, subsequent encounter       Relevant Medications   amitriptyline  (ELAVIL ) 25 MG tablet     Assessment and Plan    Pulmonary Nodule   The pulmonary nodule has grown, and a lung biopsy is scheduled with Dr. Ruther. Discussed potential malignancy management, including referral to an oncologist. Treatment options may include radiation, chemotherapy, or surgery depending on cancer type and location. Proceed with the lung biopsy as scheduled.  Type 2 Diabetes Mellitus   Blood sugar levels range from 154 to 160 mg/dL. Continue repaglinide  and Rybelsus . Ensure an adequate supply of lancets and strips.  Hyperlipidemia   Currently on Crestor  (rosuvastatin ) and requires a refill. Refill Crestor  with 90 tablets.  Aortic Atherosclerosis and Coronary Artery Calcification   CT scan shows aortic atherosclerosis and coronary artery calcification. Continue aspirin  and Crestor . Encourage regular exercise and a healthy diet. Findings are common for her age; current management is standard care.  Gastroesophageal Reflux Disease (GERD)   Takes omeprazole  regularly. Continue omeprazole .  Allergic Rhinitis   Uses Singulair  and ipratropium nasal spray. Reports Flonase  was ineffective. Continue Singulair  and refill ipratropium nasal spray.  Temporomandibular Joint Disorder (TMJ)   Uses amitriptyline  for jaw pain. Continue amitriptyline .  General Health Maintenance   Encourage regular exercise and a healthy diet.  Follow-up   No immediate need for cardiology follow-up unless new symptoms arise.        Return in about 6 months (around 01/30/2024), or if symptoms worsen or fail to improve.    Alyssa Offenberger R Lowne Chase, DO

## 2023-08-02 NOTE — Assessment & Plan Note (Signed)
 Well controlled, no changes to meds. Encouraged heart healthy diet such as the DASH diet and exercise as tolerated.

## 2023-08-02 NOTE — Patient Instructions (Signed)
 Thyroid-Stimulating Hormone Test Why am I having this test? The thyroid is a gland in the lower front of the neck. It makes hormones that affect many body parts and systems, including the system that affects how quickly the body burns fuel for energy (metabolism). The pituitary gland is located just below the brain, behind the eyes and nasal passages. It helps maintain thyroid hormone levels and thyroid gland function. You may have a thyroid-stimulating hormone (TSH) test if you have possible symptoms of abnormal thyroid hormone levels. This test can help your health care provider: Diagnose a disorder of the thyroid gland or pituitary gland. Manage your condition and treatment if you have an underactive thyroid (hypothyroidism) or an overactive thyroid (hyperthyroidism). Newborn babies may have this test done to screen for hypothyroidism that is present at birth (congenital). What is being tested? This test measures the amount of TSH in your blood. TSH may also be called thyrotropin. When the thyroid does not make enough hormones, the pituitary gland releases TSH into the bloodstream to stimulate the thyroid gland to make more hormones. What kind of sample is taken?     A blood sample is required for this test. It is usually collected by inserting a needle into a blood vessel. For newborns, a small amount of blood may be collected from the umbilical cord, or by using a small needle to prick the baby's heel (heel stick). Tell a health care provider about: All medicines you are taking, including vitamins, herbs, eye drops, creams, and over-the-counter medicines. Any bleeding problems you have. Any surgeries you have had. Any medical conditions you have. Whether you are pregnant or may be pregnant. How are the results reported? Your test results will be reported as a value that indicates how much TSH is in your blood. Your health care provider will compare your results to normal ranges that were  established after testing a large group of people (reference ranges). Reference ranges may vary among labs and hospitals. For this test, common reference ranges are: Adult: 2-10 microunits/mL or 2-10 milliunits/L. Newborn: Heel stick: 3-18 microunits/mL or 3-18 milliunits/L. Umbilical cord: 3-12 microunits/mL or 3-12 milliunits/L. What do the results mean? Results that are within the reference range are considered normal. This means that you have a normal amount of TSH in your blood. Results that are higher than the reference range mean that your TSH levels are too high. This may mean: Your thyroid gland is not making enough thyroid hormones. Your thyroid medicine dosage is too low. You have a tumor on your pituitary gland. This is rare. Results that are lower than the reference range mean that your TSH levels are too low. This may be caused by hyperthyroidism or by a problem with the pituitary gland function. Talk with your health care provider about what your results mean. Questions to ask your health care provider Ask your health care provider, or the department that is doing the test: When will my results be ready? How will I get my results? What are my treatment options? What other tests do I need? What are my next steps? Summary You may have a thyroid-stimulating hormone (TSH) test if you have possible symptoms of abnormal thyroid hormone levels. The thyroid is a gland in the lower front of the neck. It makes hormones that affect many body parts and systems. The pituitary gland is located just below the brain, behind the eyes and nasal passages. It helps maintain thyroid hormone levels and thyroid gland function. This test  measures the amount of TSH in your blood. TSH is made by the pituitary gland. It may also be called thyrotropin. This information is not intended to replace advice given to you by your health care provider. Make sure you discuss any questions you have with your  health care provider. Document Revised: 06/14/2021 Document Reviewed: 06/14/2021 Elsevier Patient Education  2024 [employer].

## 2023-08-02 NOTE — Assessment & Plan Note (Signed)
 Tolerating statin, encouraged heart healthy diet, avoid trans fats, minimize simple carbs and saturated fats. Increase exercise as tolerated

## 2023-08-03 ENCOUNTER — Encounter: Payer: Self-pay | Admitting: Family Medicine

## 2023-08-06 ENCOUNTER — Encounter (HOSPITAL_COMMUNITY): Payer: Self-pay | Admitting: Emergency Medicine

## 2023-08-06 ENCOUNTER — Other Ambulatory Visit: Payer: Self-pay

## 2023-08-06 NOTE — Progress Notes (Signed)
 Anesthesia Chart Review: Alyssa Ford   Case: [id number] Date/Time: 08/07/23 0915   Procedure: ROBOTIC ASSISTED NAVIGATIONAL BRONCHOSCOPY (Right)   Anesthesia type: General   Pre-op diagnosis: LUNG NODULE RIGHT LOWER LOBE   [facility] / [facility]   Surgeons: Denson Flake, MD       DISCUSSION: Patient is a 73 year old female scheduled for the above procedure. She has an enlarging RLL pulmonary nodule on LCS chest CT.   History includes former smoker (quit 07/16/15), post-operative N/V, emphysema, HTN, HLD, DM2, CAD (coronary calcifications on imaging 05/2019), hiatal hernia, GERD, anemia, glaucoma, thyroid  nodule, cervical fusion (2008).   Last PCP visit with Estill Hemming, DO was on 08/02/23 for DM follow-up. She noted plans for lung biopsy.  She is on Prandin  and Rybelsus  for diabetes. A1c 6.6%. She has on aspirin  and Crestor  for aortic and coronary calcifications. Evaluated by cardiologist Dr. Stann Earnest in 2022 and had a non-ischemic stress test 08/2021, so as needed follow-up recommended.  Pulmonology ordered an echocardiogram in 07/2021 that showed the EF 65 to 70%, no regional wall motion abnormalities, mild LVH, normal RV systolic function, trivial MR. Patient takes amitriptyline  for jaw pain associated with TMJ.on Singulair  and ipratropium nasal spray for allergic rhinitis  On omeprazole  for GERD.  63-month follow-up planned.  PCP did not recommended cardiology follow-up unless new symptoms arise.  Last Rybelsus  08/06/23 at 7:30 AM.  She is aware to hold on the day of surgery.  Last aspirin  08/02/2023.  Anesthesia team to evaluate on the day of surgery.  She had a recent A1c, TSH, lipid panel, CMP, CBC with differential on 08/02/2023 through primary care.  EKG in July showed normal sinus rhythm.   VS:  Wt Readings from Last 3 Encounters:  08/02/23 63.3 kg  07/23/23 64.3 kg  04/03/23 65.3 kg   BP Readings from Last 3 Encounters:  08/02/23 (!) 160/68   07/23/23 (!) 140/64  04/03/23 136/71   Pulse Readings from Last 3 Encounters:  08/02/23 72  07/23/23 88  04/03/23 76     PROVIDERS: Estill Hemming, DO is PCP  Jorge Newcomer, MD is endocrionlogist Janelle Mediate, MD is cardiologist. Evaluated on 08/19/20 for atypical chest pain with CAD risk factors. As needed follow-up recommended following normal Myoview .     LABS: For day of surgery as indicated. Last labs in [facility] include: Lab Results  Component Value Date   WBC 7.5 08/02/2023   HGB 11.6 (L) 08/02/2023   HCT 35.7 (L) 08/02/2023   PLT 305.0 08/02/2023   GLUCOSE 121 (H) 08/02/2023   ALT 10 08/02/2023   AST 14 08/02/2023   NA 140 08/02/2023   K 3.4 (L) 08/02/2023   CL 99 08/02/2023   CREATININE 0.90 08/02/2023   BUN 17 08/02/2023   CO2 28 08/02/2023   TSH 4.20 08/02/2023   HGBA1C 6.6 (H) 08/02/2023     IMAGES: US  Thyroid  07/25/23: IMPRESSION: Solid nodule in the left inferior thyroid  (labeled 2, 1.4 cm, 1.2 cm) is downgraded from TI-RADS category 4 to TI-RADS category 3 and therefore does not meet criteria for dedicated follow-up. - The above is in keeping with the ACR TI-RADS recommendations - J Am Coll Radiol 2017;14:587-595.  PET Scan 07/06/23: IMPRESSION: 1. Hypermetabolic 7 mm posterior right lower lobe nodule, indicative of stage IA primary bronchogenic carcinoma. 2. Small right renal stone. 3. 2.2 cm low-attenuation left thyroid  nodule. Previous thyroid  ultrasound 07/11/2018. Consider repeat thyroid  ultrasound, as clinically  indicated. (Ref: J Am Coll Radiol. 2015 Feb;12(2): 143-50). 4. Aortic atherosclerosis (ICD10-I70.0). Coronary artery calcification.  CT Chest [facility] 06/13/23: IMPRESSION: 1. Lung-RADS 4B, suspicious. Additional imaging evaluation or consultation with Pulmonology or Thoracic Surgery recommended. Right lower lobe, 8.4 mm, image 146/4. 2. Coronary artery calcifications 3. Aortic Atherosclerosis (ICD10-I70.0) and Emphysema  (ICD10-J43.9).     EKG: 12/29/22: Normal sinus rhythm Minimal voltage criteria for LVH, may be normal variant ( R in aVL ) When compared with ECG of 26-Nov-2022 12:56, PREVIOUS ECG IS PRESENT No STEMI Confirmed by Jerald Molly [phone]) on 12/29/2022 7:17:52 PM   CV: Echo 08/20/21: IMPRESSIONS   1. Left ventricular ejection fraction, by estimation, is 65 to 70%. The  left ventricle has normal function. The left ventricle has no regional  wall motion abnormalities. There is mild left ventricular hypertrophy.  Left ventricular diastolic parameters  are indeterminate.   2. Right ventricular systolic function is normal. The right ventricular  size is normal.   3. The mitral valve is normal in structure. Trivial mitral valve  regurgitation.   4. The aortic valve is tricuspid. Aortic valve regurgitation is not  visualized. Aortic valve sclerosis is present, with no evidence of aortic  valve stenosis.   5. The inferior vena cava is normal in size with greater than 50%  respiratory variability, suggesting right atrial pressure of 3 mmHg.    Nuclear stress test 08/31/20: The left ventricular ejection fraction is normal (55-65%). Nuclear stress EF: 63%. There was no ST segment deviation noted during stress. The study is normal. This is a low risk study.   Normal stress nuclear study with no ischemia or infarction.  Gated ejection fraction 63% with normal wall motion.   Past Medical History:  Diagnosis Date   Anemia    Anxiety    Arthritis    Cataract    Chronic headaches    pt states that they have improved - 08/06/23   Coronary artery disease    Depression    Diabetes mellitus    Diverticulosis of colon (without mention of hemorrhage) 2003, 2008   Colonoscopy   Eczema    Emphysema of lung (HCC)    GERD (gastroesophageal reflux disease)    Glaucoma    Hiatal hernia 2003   EGD    History of pneumonia 11/2022   Hyperlipidemia    Hypertension    PONV (postoperative nausea  and vomiting)    Thyroid  nodule     Past Surgical History:  Procedure Laterality Date   CARPAL TUNNEL RELEASE  2006   CARPAL TUNNEL RELEASE  12/24/2007   CATARACT EXTRACTION, BILATERAL     [place]---  sept and oct 2018   CERVICAL FUSION  08/2006   CHOLECYSTECTOMY     COSMETIC SURGERY  December 09 2018   EYE SURGERY  catracts   FASCIECTOMY Left 08/01/2018   Procedure: FASCIECTOMY LEFT RING;  Surgeon: Lyanne Sample, MD;  Location: [facility];  Service: Orthopedics;  Laterality: Left;  UPPER ARM   SPINE SURGERY  neck dic replaced   TRIGGER FINGER RELEASE Left 08/01/2018   Procedure: RELEASE TRIGGER FINGER/A-1 PULLEY LEFTRING;  Surgeon: Lyanne Sample, MD;  Location: [facility];  Service: Orthopedics;  Laterality: Left;   TUBAL LIGATION      MEDICATIONS: No current facility-administered medications for this encounter.    amitriptyline  (ELAVIL ) 25 MG tablet   aspirin  EC 81 MG tablet   BIOTIN PO   Coenzyme Q10 (CO Q 10 PO)  fenofibrate  160 MG tablet   Ferrous Sulfate (IRON SUPPLEMENT PO)   fluticasone  (FLONASE ) 50 MCG/ACT nasal spray   ipratropium (ATROVENT ) 0.06 % nasal spray   latanoprost  (XALATAN ) 0.005 % ophthalmic solution   lisinopril  (ZESTRIL ) 5 MG tablet   metFORMIN  (GLUCOPHAGE -XR) 500 MG 24 hr tablet   montelukast  (SINGULAIR ) 10 MG tablet   Naproxen Sodium (ALEVE PO)   NONFORMULARY OR COMPOUNDED ITEM   omeprazole  (PRILOSEC) 20 MG capsule   repaglinide  (PRANDIN ) 1 MG tablet   rosuvastatin  (CRESTOR ) 40 MG tablet   Semaglutide  (RYBELSUS ) 7 MG TABS    Ella Gun, PA-C Surgical Short Stay/Anesthesiology [facility] Phone [phone] [facility] Phone [phone] 08/06/2023 1:49 PM

## 2023-08-06 NOTE — Anesthesia Preprocedure Evaluation (Addendum)
Anesthesia Evaluation  Patient identified by MRN, date of birth, ID band Patient awake    Reviewed: Allergy & Precautions, NPO status , Patient's Chart, lab work & pertinent test results  History of Anesthesia Complications (+) PONV and history of anesthetic complications  Airway Mallampati: II  TM Distance: >3 FB Neck ROM: Full    Dental no notable dental hx.    Pulmonary COPD, former smoker RLL nodule   Pulmonary exam normal        Cardiovascular hypertension, Pt. on medications + CAD and + Peripheral Vascular Disease  Normal cardiovascular exam  Echo 08/20/21: EF 65-70%, mild LVH, valves ok    Neuro/Psych  Headaches  Anxiety Depression    S/p cervical fusion (2008)    GI/Hepatic hiatal hernia,GERD  Medicated,,  Endo/Other  diabetes, Type 2, Oral Hypoglycemic AgentsHypothyroidism    Renal/GU      Musculoskeletal  (+) Arthritis ,    Abdominal   Peds  Hematology  (+) Blood dyscrasia (Hgb 11.6), anemia   Anesthesia Other Findings   Reproductive/Obstetrics                             Anesthesia Physical Anesthesia Plan  ASA: 2  Anesthesia Plan: General   Post-op Pain Management: Minimal or no pain anticipated   Induction: Intravenous  PONV Risk Score and Plan: 2 and Treatment may vary due to age or medical condition, Ondansetron, Dexamethasone and Midazolam  Airway Management Planned: Oral ETT  Additional Equipment: None  Intra-op Plan:   Post-operative Plan: Extubation in OR  Informed Consent: I have reviewed the patients History and Physical, chart, labs and discussed the procedure including the risks, benefits and alternatives for the proposed anesthesia with the patient or authorized representative who has indicated his/her understanding and acceptance.     Dental advisory given  Plan Discussed with: CRNA  Anesthesia Plan Comments: (PAT note written 08/06/2023 by  Shonna Chock, PA-C.  )       Anesthesia Quick Evaluation

## 2023-08-06 NOTE — Progress Notes (Signed)
 SDW CALL  Patient was given pre-op instructions over the phone. The opportunity was given for the patient to ask questions. No further questions asked. Patient verbalized understanding of instructions given.   PCP - Dr. Clide Dalton Cardiologist - denies Pulmonologist - Debora Fallen  PPM/ICD - denies Device Orders - n/a Rep Notified - n/a  Chest x-ray -  EKG - 12/29/22 Stress Test - 12/29/22 ECHO - 08/10/21 Cardiac Cath - 08/31/20  Sleep Study - denies CPAP - n/a  Fasting Blood Sugar - 120s Checks Blood Sugar every other day  Last dose of GLP1 agonist-  2/10 at 0730 - takes Semaglutide  daily GLP1 instructions: patient aware not to take on DOS  Blood Thinner Instructions: n/a Aspirin  Instructions: last dose 2/6  ERAS Protcol - NPO   COVID TEST- no   Anesthesia review: yes - semaglutide   Patient denies shortness of breath, fever, cough and chest pain over the phone call   All instructions explained to the patient, with a verbal understanding of the material. Patient agrees to go over the instructions while at home for a better understanding.

## 2023-08-07 ENCOUNTER — Ambulatory Visit (HOSPITAL_COMMUNITY): Payer: Medicare Other

## 2023-08-07 ENCOUNTER — Encounter (HOSPITAL_COMMUNITY): Payer: Self-pay | Admitting: Emergency Medicine

## 2023-08-07 ENCOUNTER — Other Ambulatory Visit: Payer: Self-pay

## 2023-08-07 ENCOUNTER — Ambulatory Visit (HOSPITAL_COMMUNITY)
Admission: RE | Admit: 2023-08-07 | Discharge: 2023-08-07 | Disposition: A | Discharge status: Home / Self Care | Payer: Medicare Other | Source: Ambulatory Visit | Attending: Emergency Medicine | Admitting: Emergency Medicine

## 2023-08-07 ENCOUNTER — Ambulatory Visit (HOSPITAL_BASED_OUTPATIENT_CLINIC_OR_DEPARTMENT_OTHER): Payer: Medicare Other | Admitting: Vascular Surgery

## 2023-08-07 ENCOUNTER — Encounter (HOSPITAL_COMMUNITY)
Admission: RE | Disposition: A | Discharge status: Home / Self Care | Payer: Self-pay | Source: Ambulatory Visit | Attending: Emergency Medicine

## 2023-08-07 ENCOUNTER — Ambulatory Visit (HOSPITAL_COMMUNITY): Payer: Medicare Other | Admitting: Vascular Surgery

## 2023-08-07 DIAGNOSIS — Z48813 Encounter for surgical aftercare following surgery on the respiratory system: Secondary | ICD-10-CM | POA: Diagnosis not present

## 2023-08-07 DIAGNOSIS — I2510 Atherosclerotic heart disease of native coronary artery without angina pectoris: Secondary | ICD-10-CM

## 2023-08-07 DIAGNOSIS — F32A Depression, unspecified: Secondary | ICD-10-CM | POA: Insufficient documentation

## 2023-08-07 DIAGNOSIS — Z7982 Long term (current) use of aspirin: Secondary | ICD-10-CM | POA: Insufficient documentation

## 2023-08-07 DIAGNOSIS — Z79899 Other long term (current) drug therapy: Secondary | ICD-10-CM | POA: Insufficient documentation

## 2023-08-07 DIAGNOSIS — I10 Essential (primary) hypertension: Secondary | ICD-10-CM | POA: Insufficient documentation

## 2023-08-07 DIAGNOSIS — E039 Hypothyroidism, unspecified: Secondary | ICD-10-CM | POA: Insufficient documentation

## 2023-08-07 DIAGNOSIS — K449 Diaphragmatic hernia without obstruction or gangrene: Secondary | ICD-10-CM | POA: Insufficient documentation

## 2023-08-07 DIAGNOSIS — K219 Gastro-esophageal reflux disease without esophagitis: Secondary | ICD-10-CM | POA: Diagnosis not present

## 2023-08-07 DIAGNOSIS — J432 Centrilobular emphysema: Secondary | ICD-10-CM | POA: Diagnosis not present

## 2023-08-07 DIAGNOSIS — E119 Type 2 diabetes mellitus without complications: Secondary | ICD-10-CM

## 2023-08-07 DIAGNOSIS — S0300XD Dislocation of jaw, unspecified side, subsequent encounter: Secondary | ICD-10-CM

## 2023-08-07 DIAGNOSIS — E785 Hyperlipidemia, unspecified: Secondary | ICD-10-CM | POA: Insufficient documentation

## 2023-08-07 DIAGNOSIS — R911 Solitary pulmonary nodule: Secondary | ICD-10-CM | POA: Diagnosis present

## 2023-08-07 DIAGNOSIS — J449 Chronic obstructive pulmonary disease, unspecified: Secondary | ICD-10-CM | POA: Diagnosis not present

## 2023-08-07 DIAGNOSIS — Z7984 Long term (current) use of oral hypoglycemic drugs: Secondary | ICD-10-CM | POA: Diagnosis not present

## 2023-08-07 DIAGNOSIS — Z7985 Long-term (current) use of injectable non-insulin antidiabetic drugs: Secondary | ICD-10-CM | POA: Diagnosis not present

## 2023-08-07 DIAGNOSIS — F419 Anxiety disorder, unspecified: Secondary | ICD-10-CM | POA: Insufficient documentation

## 2023-08-07 DIAGNOSIS — Z87891 Personal history of nicotine dependence: Secondary | ICD-10-CM | POA: Insufficient documentation

## 2023-08-07 DIAGNOSIS — R846 Abnormal cytological findings in specimens from respiratory organs and thorax: Secondary | ICD-10-CM | POA: Diagnosis not present

## 2023-08-07 HISTORY — PX: BRONCHIAL BRUSHINGS: SHX5108

## 2023-08-07 HISTORY — DX: Atherosclerotic heart disease of native coronary artery without angina pectoris: I25.10

## 2023-08-07 HISTORY — PX: BRONCHIAL BIOPSY: SHX5109

## 2023-08-07 HISTORY — PX: FIDUCIAL MARKER PLACEMENT: SHX6858

## 2023-08-07 HISTORY — PX: BRONCHIAL NEEDLE ASPIRATION BIOPSY: SHX5106

## 2023-08-07 HISTORY — DX: Nausea with vomiting, unspecified: R11.2

## 2023-08-07 HISTORY — DX: Other specified postprocedural states: Z98.890

## 2023-08-07 LAB — GLUCOSE, CAPILLARY
Glucose-Capillary: 101 mg/dL — ABNORMAL HIGH (ref 70–99)
Glucose-Capillary: 124 mg/dL — ABNORMAL HIGH (ref 70–99)

## 2023-08-07 SURGERY — BRONCHOSCOPY, WITH BIOPSY USING ELECTROMAGNETIC NAVIGATION
Anesthesia: General | Laterality: Right

## 2023-08-07 MED ORDER — FLUTICASONE PROPIONATE 50 MCG/ACT NA SUSP: 2.0000 | Freq: Every day | NASAL | Status: DC | PRN

## 2023-08-07 MED ORDER — INSULIN ASPART 100 UNIT/ML IJ SOLN: 0.0000 [IU] | INTRAMUSCULAR | Status: DC | PRN

## 2023-08-07 MED ORDER — LIDOCAINE 2% (20 MG/ML) 5 ML SYRINGE
INTRAMUSCULAR | Status: DC | PRN
  Administered 2023-08-07: 10 mg via INTRAVENOUS
  Administered 2023-08-07: 60 mg via INTRAVENOUS

## 2023-08-07 MED ORDER — LACTATED RINGERS IV SOLN
INTRAVENOUS | Status: DC | PRN
Start: 2023-08-07 — End: 2023-08-07

## 2023-08-07 MED ORDER — DEXAMETHASONE SODIUM PHOSPHATE 10 MG/ML IJ SOLN
INTRAMUSCULAR | Status: DC | PRN
  Administered 2023-08-07: 4 mg via INTRAVENOUS

## 2023-08-07 MED ORDER — PROPOFOL 10 MG/ML IV BOLUS
INTRAVENOUS | Status: DC | PRN
  Administered 2023-08-07 (×2): 30 mg via INTRAVENOUS
  Administered 2023-08-07: 100 mg via INTRAVENOUS
  Administered 2023-08-07: 20 mg via INTRAVENOUS

## 2023-08-07 MED ORDER — ESMOLOL HCL 100 MG/10ML IV SOLN
INTRAVENOUS | Status: DC | PRN
  Administered 2023-08-07: 20 mg via INTRAVENOUS

## 2023-08-07 MED ORDER — SUGAMMADEX SODIUM 200 MG/2ML IV SOLN
INTRAVENOUS | Status: DC | PRN
  Administered 2023-08-07: 400 mg via INTRAVENOUS

## 2023-08-07 MED ORDER — CHLORHEXIDINE GLUCONATE 0.12 % MT SOLN
15.0000 mL | Freq: Once | OROMUCOSAL | Status: AC
  Administered 2023-08-07: 15 mL via OROMUCOSAL
  Filled 2023-08-07: qty 15

## 2023-08-07 MED ORDER — NONFORMULARY OR COMPOUNDED ITEM: 1.0000 | Freq: Every day | Status: AC

## 2023-08-07 MED ORDER — SODIUM CHLORIDE 0.9 % IV SOLN: INTRAVENOUS | Status: DC

## 2023-08-07 MED ORDER — DROPERIDOL 2.5 MG/ML IJ SOLN
0.6250 mg | Freq: Once | INTRAMUSCULAR | Status: AC | PRN
  Administered 2023-08-07: 0.625 mg via INTRAVENOUS

## 2023-08-07 MED ORDER — AMITRIPTYLINE HCL 25 MG PO TABS: 50.0000 mg | ORAL_TABLET | Freq: Every day | ORAL | Status: DC

## 2023-08-07 MED ORDER — DROPERIDOL 2.5 MG/ML IJ SOLN
INTRAMUSCULAR | Status: AC
  Filled 2023-08-07: qty 2

## 2023-08-07 MED ORDER — EPHEDRINE SULFATE-NACL 50-0.9 MG/10ML-% IV SOSY
PREFILLED_SYRINGE | INTRAVENOUS | Status: DC | PRN
  Administered 2023-08-07: 15 mg via INTRAVENOUS

## 2023-08-07 MED ORDER — ROCURONIUM BROMIDE 10 MG/ML (PF) SYRINGE
PREFILLED_SYRINGE | INTRAVENOUS | Status: DC | PRN
  Administered 2023-08-07: 20 mg via INTRAVENOUS
  Administered 2023-08-07: 50 mg via INTRAVENOUS

## 2023-08-07 MED ORDER — ONDANSETRON HCL 4 MG/2ML IJ SOLN
INTRAMUSCULAR | Status: DC | PRN
  Administered 2023-08-07: 4 mg via INTRAVENOUS

## 2023-08-07 MED ORDER — FENTANYL CITRATE (PF) 100 MCG/2ML IJ SOLN: 25.0000 ug | INTRAMUSCULAR | Status: DC | PRN

## 2023-08-07 SURGICAL SUPPLY — 1 items: superlock fiducial marker IMPLANT

## 2023-08-07 NOTE — Transfer of Care (Signed)
Immediate Anesthesia Transfer of Care Note  Patient: Kalisa H Palmateer  Procedure(s) Performed: ROBOTIC ASSISTED NAVIGATIONAL BRONCHOSCOPY (Right) BRONCHIAL BIOPSIES BRONCHIAL BRUSHINGS BRONCHIAL NEEDLE ASPIRATION BIOPSIES FIDUCIAL MARKER PLACEMENT  Patient Location: PACU  Anesthesia Type:General  Level of Consciousness: awake and patient cooperative  Airway & Oxygen Therapy: Patient Spontanous Breathing and Patient connected to nasal cannula oxygen  Post-op Assessment: Report given to RN and Post -op Vital signs reviewed and stable  Post vital signs: Reviewed and stable  Last Vitals:  Vitals Value Taken Time  BP 151/65 08/07/23 1015  Temp    Pulse 89 08/07/23 1018  Resp 15 08/07/23 1018  SpO2 98 % 08/07/23 1018  Vitals shown include unfiled device data.  Last Pain:  Vitals:   08/07/23 0706  TempSrc:   PainSc: 0-No pain      Patients Stated Pain Goal: 0 (08/07/23 0706)  Complications: No notable events documented.

## 2023-08-07 NOTE — Anesthesia Postprocedure Evaluation (Signed)
Anesthesia Post Note  Patient: Alyssa Ford  Procedure(s) Performed: ROBOTIC ASSISTED NAVIGATIONAL BRONCHOSCOPY (Right) BRONCHIAL BIOPSIES BRONCHIAL BRUSHINGS BRONCHIAL NEEDLE ASPIRATION BIOPSIES FIDUCIAL MARKER PLACEMENT     Patient location during evaluation: PACU Anesthesia Type: General Level of consciousness: awake and alert Pain management: pain level controlled Vital Signs Assessment: post-procedure vital signs reviewed and stable Respiratory status: spontaneous breathing, nonlabored ventilation and respiratory function stable Cardiovascular status: blood pressure returned to baseline Postop Assessment: no apparent nausea or vomiting Anesthetic complications: no   No notable events documented.  Last Vitals:  Vitals:   08/07/23 1030 08/07/23 1045  BP: (!) 138/58 129/66  Pulse: 85 83  Resp: 11 11  Temp:  36.7 C  SpO2: 98% 94%    Last Pain:  Vitals:   08/07/23 1045  TempSrc:   PainSc: 0-No pain                 Shanda Howells

## 2023-08-07 NOTE — Anesthesia Procedure Notes (Addendum)
Procedure Name: Intubation Date/Time: 08/07/2023 9:16 AM  Performed by: Stanton Kidney, CRNAPre-anesthesia Checklist: Patient identified, Patient being monitored, Timeout performed, Emergency Drugs available and Suction available Patient Re-evaluated:Patient Re-evaluated prior to induction Oxygen Delivery Method: Circle system utilized Preoxygenation: Pre-oxygenation with 100% oxygen Induction Type: IV induction Ventilation: Mask ventilation without difficulty Laryngoscope Size: 3 and Miller Grade View: Grade I Tube type: Oral Tube size: 7.0 mm Number of attempts: 1 Airway Equipment and Method: Stylet Placement Confirmation: ETT inserted through vocal cords under direct vision, positive ETCO2 and breath sounds checked- equal and bilateral Secured at: 21 cm Tube secured with: Tape Dental Injury: Teeth and Oropharynx as per pre-operative assessment

## 2023-08-07 NOTE — Interval H&P Note (Signed)
History and Physical Interval Note:  08/07/2023 7:47 AM  Alyssa Ford  has presented today for surgery, with the diagnosis of LUNG NODULE RIGHT LOWER LOBE.  The various methods of treatment have been discussed with the patient and family. After consideration of risks, benefits and other options for treatment, the patient has consented to  Procedure(s): ROBOTIC ASSISTED NAVIGATIONAL BRONCHOSCOPY (Right) as a surgical intervention.  The patient's history has been reviewed, patient examined, no change in status, stable for surgery.  I have reviewed the patient's chart and labs.  Questions were answered to the patient's satisfaction.     Leslye Peer

## 2023-08-07 NOTE — Op Note (Signed)
Video Bronchoscopy with Robotic Assisted Bronchoscopic Navigation   Date of Operation: 08/07/2023   Pre-op Diagnosis: Right lower lobe pulmonary nodule  Post-op Diagnosis: Same  Surgeon: Levy Pupa  Assistants: None  Anesthesia: General endotracheal anesthesia  Operation: Flexible video fiberoptic bronchoscopy with robotic assistance and biopsies.  Estimated Blood Loss: Minimal  Complications: None  Indications and History: Alyssa Ford is a 73 y.o. female with history of tobacco use who was noted to have an enlarging right lower lobe pulmonary nodule on lung cancer screening CT scans of the chest.  Recommendation made to achieve a tissue diagnosis via robotic assisted navigational bronchoscopy. The risks, benefits, complications, treatment options and expected outcomes were discussed with the patient.  The possibilities of pneumothorax, pneumonia, reaction to medication, pulmonary aspiration, perforation of a viscus, bleeding, failure to diagnose a condition and creating a complication requiring transfusion or operation were discussed with the patient who freely signed the consent.    Description of Procedure: The patient was seen in the Preoperative Area, was examined and was deemed appropriate to proceed.  The patient was taken to [facility] endoscopy room 3, identified as Alyssa Ford and the procedure verified as Flexible Video Fiberoptic Bronchoscopy.  A Time Out was held and the above information confirmed.   Prior to the date of the procedure a high-resolution CT scan of the chest was performed. Utilizing ION software program a virtual tracheobronchial tree was generated to allow the creation of distinct navigation pathways to the patient's parenchymal abnormalities. After being taken to the operating room general anesthesia was initiated and the patient  was orally intubated. The video fiberoptic bronchoscope was introduced via the endotracheal tube and a general inspection was  performed which showed normal right and left lung anatomy. Aspiration of the bilateral mainstems was completed to remove any remaining secretions. Robotic catheter inserted into patient's endotracheal tube.   Target #1 right lower lobe pulmonary nodule: The distinct navigation pathways prepared prior to this procedure were then utilized to navigate to patient's lesion identified on CT scan. The robotic catheter was secured into place and the vision probe was withdrawn.  Lesion location was approximated using fluoroscopy.  Local registration and targeting was performed using Cios three-dimensional imaging. Under fluoroscopic guidance transbronchial needle brushings, transbronchial needle biopsies, and transbronchial forceps biopsies were performed to be sent for cytology and pathology.  Under fluoroscopic guidance a single fiducial marker was placed adjacent to the nodule   At the end of the procedure a general airway inspection was performed and there was no evidence of active bleeding. The bronchoscope was removed.  The patient tolerated the procedure well. There was no significant blood loss and there were no obvious complications. A post-procedural chest x-ray is pending.  Samples Target #1: 1. Transbronchial needle brushings from right lower lobe pulmonary nodule 2. Transbronchial Wang needle biopsies from right lower lobe pulmonary nodule 3. Transbronchial forceps biopsies from right lower lobe pulmonary nodule   Plans:  The patient will be discharged from the PACU to home when recovered from anesthesia and after chest x-ray is reviewed. We will review the cytology, pathology and microbiology results with the patient when they become available. Outpatient followup will be with Saralyn Pilar, NP.   Levy Pupa, MD, PhD 08/07/2023, 10:07 AM [facility] [phone] or if no answer before 7:00PM call [phone] For any issues after 7:00PM please call eLink  [phone]

## 2023-08-07 NOTE — Discharge Instructions (Addendum)
Flexible Bronchoscopy, Care After This sheet gives you information about how to care for yourself after your test. Your doctor may also give you more specific instructions. If you have problems or questions, contact your doctor. Follow these instructions at home: Eating and drinking When your numbness is gone and your cough and gag reflexes have come back, you may: Eat only soft foods. Slowly drink liquids. When you get home after the test, go back to your normal diet. Driving Do not drive for 24 hours if you were given a medicine to help you relax (sedative). Do not drive or use heavy machinery while taking prescription pain medicine. General instructions  Take over-the-counter and prescription medicines only as told by your doctor. Return to your normal activities as told. Ask what activities are safe for you. Do not use any products that have nicotine or tobacco in them. This includes cigarettes and e-cigarettes. If you need help quitting, ask your doctor. Keep all follow-up visits as told by your doctor. This is important. It is very important if you had a tissue sample (biopsy) taken. Get help right away if: You have shortness of breath that gets worse. You get light-headed. You feel like you are going to pass out (faint). You have chest pain. You cough up: More than a little blood. More blood than before. Summary Do not eat or drink anything (not even water) for 2 hours after your test, or until your numbing medicine wears off. Do not use cigarettes. Do not use e-cigarettes. Get help right away if you have chest pain.  Please call our office for any questions or concerns.  [phone].  This information is not intended to replace advice given to you by your health care provider. Make sure you discuss any questions you have with your health care provider. Document Released: 04/09/2009 Document Revised: 05/25/2017 Document Reviewed: 06/30/2016 Elsevier Patient Education  2020  [employer].

## 2023-08-08 LAB — CYTOLOGY - NON PAP

## 2023-08-14 ENCOUNTER — Ambulatory Visit: Payer: Medicare Other | Admitting: Acute Care

## 2023-08-14 ENCOUNTER — Encounter: Payer: Self-pay | Admitting: Acute Care

## 2023-08-14 VITALS — BP 125/68 | HR 82 | Temp 97.1°F | Ht 66.0 in | Wt 140.6 lb

## 2023-08-14 DIAGNOSIS — Z87891 Personal history of nicotine dependence: Secondary | ICD-10-CM | POA: Diagnosis not present

## 2023-08-14 DIAGNOSIS — R911 Solitary pulmonary nodule: Secondary | ICD-10-CM

## 2023-08-14 DIAGNOSIS — Z9889 Other specified postprocedural states: Secondary | ICD-10-CM

## 2023-08-14 NOTE — Progress Notes (Signed)
History of Present Illness Alyssa Ford is a 73 y.o. female former smoker followed through the lung cancer screening program for abnormal imaging. She was referred January 2025 for consultation regarding an enlarging right lower lobe pulmonary nodule that was PET avid, and concerning for stage Ia primary bronchogenic carcinoma.  Patient underwent bronchoscopy with biopsies on 08/07/2023 to further evaluate the nodule of concern..    08/14/2023 Pt. Presents for follow up after bronchoscopy with biopsies. She feels she did well. She denies any bleeding, fever, no discolored secretions, no shortness of breath.  She did have nausea when she woke up from anesthesia.  She has experienced unintentional weight loss, losing approximately 24 pounds over the last six months. She attributes some of this weight loss to the medication Rybelsus, which she takes for diabetes. She reports difficulty eating certain foods and has lost her appetite for sweets, preferring chips instead. Her weight has fluctuated, with a significant loss following pneumonia last summer, from which she has not fully regained her previous weight.  She quit smoking in January 2017 and occasionally vapes, although she is working on quitting vaping as well.  We have reviewed her cytology results.  The sample from the right lower lobe fine-needle aspiration was read as atypical cells that favor reactive bronchial cells.  The right lower lobe brushing  was negative for malignancy. There was notation of scant cellularity predominantly blood with rare benign bronchial cells and pulmonary macrophages  We did discuss that atypical cells are abnormal cells that need to be followed very closely especially in light of the fact that the posterior right lower lobe nodule with hypermetabolic on pet imaging.  I will also make sure Dr. Delton Coombes is aware that this was read as atypical cells.  Plan will be for 96-month follow-up CT chest without contrast  to reevaluate the nodule for any growth or change. Patient is in agreement with this plan.  She understands that if she develops any hemoptysis or has unexplained weight loss she needs to call us to be seen sooner.  She verbalized understanding  Test Results: FINAL MICROSCOPIC DIAGNOSIS:  A.  RIGHT LUNG, LOWER LOBE, NODULE, FINE NEEDLE ASPIRATION:  - Atypical  - Atypical cells, favor reactive bronchial cells  - Benign alveolated lung (cellblock)   B.  RIGHT LUNG, LOWER LOBE, NODULE, BRUSHING:  - Negative for malignancy  - Very scant cellularity; predominantly blood with very rare benign  bronchial cells and pulmonary macrophages      Latest Ref Rng & Units 08/02/2023    9:40 AM 02/12/2023    9:07 AM 01/15/2023   10:25 AM  CBC  WBC 4.0 - 10.5 K/uL 7.5  5.9  9.4   Hemoglobin 12.0 - 15.0 g/dL 16.1  09.6  04.5   Hematocrit 36.0 - 46.0 % 35.7  33.6  33.6   Platelets 150.0 - 400.0 K/uL 305.0  267.0  267.0        Latest Ref Rng & Units 08/02/2023    9:40 AM 02/12/2023    9:07 AM 12/29/2022    5:11 PM  BMP  Glucose 70 - 99 mg/dL 409  811  914   BUN 6 - 23 mg/dL 17  22  15    Creatinine 0.40 - 1.20 mg/dL 7.82  9.56  2.13   Sodium 135 - 145 mEq/L 140  140  138   Potassium 3.5 - 5.1 mEq/L 3.4  4.0  3.7   Chloride 96 - 112 mEq/L 99  102  101   CO2 19 - 32 mEq/L 28  27  23    Calcium 8.4 - 10.5 mg/dL 8.6  9.3  7.3     BNP No results found for: "BNP"  ProBNP No results found for: "PROBNP"  PFT    Component Value Date/Time   FEV1PRE 1.76 11/02/2021 0858   FEV1POST 1.76 11/02/2021 0858   FVCPRE 2.27 11/02/2021 0858   FVCPOST 2.24 11/02/2021 0858   TLC 5.57 11/02/2021 0858   DLCOUNC 20.33 11/02/2021 0858   PREFEV1FVCRT 77 11/02/2021 0858   PSTFEV1FVCRT 79 11/02/2021 0858    DG Chest Port 1 View Result Date: 08/07/2023 CLINICAL DATA:  Status post bronchoscopy with biopsy EXAM: PORTABLE CHEST 1 VIEW COMPARISON:  X-ray 12/29/2022.  PET-CT 07/06/2023. FINDINGS: Tiny marker seen along  the right lower lung with some subtle parenchymal nodularity. Please correlate for the exact location of lesion and bronchoscopy. Underlying chronic lung changes are again seen. No consolidation, pneumothorax or effusion otherwise. No edema. Normal cardiopericardial silhouette. Overlapping cardiac leads. Fixation hardware along the lower cervical spine. IMPRESSION: No pneumothorax or effusion post bronchoscopy. Electronically Signed   By: Karen Kays M.D.   On: 08/07/2023 12:12   DG C-ARM BRONCHOSCOPY Result Date: 08/07/2023 C-ARM BRONCHOSCOPY: Fluoroscopy was utilized by the requesting physician.  No radiographic interpretation.   US THYROID Result Date: 07/29/2023 CLINICAL DATA:  Prior ultrasound follow-up. EXAM: THYROID ULTRASOUND TECHNIQUE: Ultrasound examination of the thyroid gland and adjacent soft tissues was performed. COMPARISON:  07/11/2018, 04/23/2020 FINDINGS: Parenchymal Echotexture: Mildly heterogeneous Isthmus: 0.2 cm ,previously 0.2 cm Right lobe: 4.1 x 1.0 x 1.5 cm ,previously 3.8 x 1.5 x 1.2 cm Left lobe: 3.6 x 1.3 x 1.0 cm ,previously 3.8 x 1.2 x 1.3 cm ________________________________________________________ Estimated total number of nodules >/= 1 cm: 1 Number of spongiform nodules >/=  2 cm not described below (TR1): 0 Number of mixed cystic and solid nodules >/= 1.5 cm not described below (TR2): 0 _________________________________________________________ Nodule # 2: Prior biopsy: No Location: Left; Inferior Maximum size: 1.4 cm; Other 2 dimensions: 1.2 x 1.2 cm, previously, 1.2 x 1.0 x 1.0 cm Composition: solid/almost completely solid (2) Echogenicity: isoechoic (1) Shape: not taller-than-wide (0) Margins: smooth (0) Echogenic foci: none (0) ACR TI-RADS total points: 3. ACR TI-RADS risk category:  TR3 (3 points). Significant change in size (>/= 20% in two dimensions and minimal increase of 2 mm): Yes Change in features: Yes Change in ACR TI-RADS risk category: Yes ACR TI-RADS  recommendations: Given size (<1.5 cm) and appearance, this nodule does NOT meet TI-RADS criteria for biopsy or dedicated follow-up. _________________________________________________________ No cervical lymphadenopathy. IMPRESSION: Solid nodule in the left inferior thyroid (labeled 2, 1.4 cm, 1.2 cm) is downgraded from TI-RADS category 4 to TI-RADS category 3 and therefore does not meet criteria for dedicated follow-up. The above is in keeping with the ACR TI-RADS recommendations - J Am Coll Radiol 2017;14:587-595. Marliss Coots, MD [facility] Radiology Electronically Signed   By: Marliss Coots M.D.   On: 07/29/2023 09:10     Past medical hx Past Medical History:  Diagnosis Date   Anemia    Anxiety    Arthritis    Cataract    Chronic headaches    pt states that they have improved - 08/06/23   Coronary artery disease    Depression    Diabetes mellitus    Diverticulosis of colon (without mention of hemorrhage) 2003, 2008   Colonoscopy   Eczema  Emphysema of lung (HCC)    GERD (gastroesophageal reflux disease)    Glaucoma    Hiatal hernia 2003   EGD    History of pneumonia 11/2022   Hyperlipidemia    Hypertension    PONV (postoperative nausea and vomiting)    Thyroid nodule      Social History   Tobacco Use   Smoking status: Former    Current packs/day: 0.00    Average packs/day: 1.4 packs/day for 49.0 years (68.6 ttl pk-yrs)    Types: Cigarettes    Start date: 07/15/1966    Quit date: 07/16/2015    Years since quitting: 8.0    Passive exposure: Past   Smokeless tobacco: Never   Tobacco comments:    Continues to vape but states pod do not contain nicotine  Vaping Use   Vaping status: Some Days  Substance Use Topics   Alcohol use: Yes    Alcohol/week: 0.0 standard drinks of alcohol    Comment: once weekly; wine and beer   Drug use: No    Ms.Walgren reports that she quit smoking about 8 years ago. Her smoking use included  cigarettes. She started smoking about 57 years ago. She has a 68.6 pack-year smoking history. She has been exposed to tobacco smoke. She has never used smokeless tobacco. She reports current alcohol use. She reports that she does not use drugs.  Tobacco Cessation: Counseling given: Not Answered Tobacco comments: Continues to vape but states pod do not contain nicotine Former smoker quit in 2017 with a 68.6-pack-year smoking history  Past surgical hx, Family hx, Social hx all reviewed.  Current Outpatient Medications on File Prior to Visit  Medication Sig   amitriptyline (ELAVIL) 25 MG tablet Take 2 tablets (50 mg total) by mouth at bedtime.   aspirin EC 81 MG tablet Take 81 mg by mouth daily.   BIOTIN PO Take 1 tablet by mouth daily.    Coenzyme Q10 (CO Q 10 PO) Take 1 capsule by mouth daily.   fenofibrate 160 MG tablet TAKE 1 TABLET BY MOUTH DAILY   Ferrous Sulfate (IRON SUPPLEMENT PO) Take 1 tablet by mouth daily. 65mg    fluticasone (FLONASE) 50 MCG/ACT nasal spray Place 2 sprays into both nostrils daily as needed for allergies.   ipratropium (ATROVENT) 0.06 % nasal spray Place 2 sprays into both nostrils 3 (three) times daily as needed for rhinitis.   latanoprost (XALATAN) 0.005 % ophthalmic solution Place 1 drop into both eyes at bedtime.    lisinopril (ZESTRIL) 5 MG tablet Take 1 tablet (5 mg total) by mouth daily.   metFORMIN (GLUCOPHAGE-XR) 500 MG 24 hr tablet TAKE 4 TABLETS BY MOUTH EVERY DAY   montelukast (SINGULAIR) 10 MG tablet Take 1 tablet (10 mg total) by mouth at bedtime.   Naproxen Sodium (ALEVE PO) Take 2 tablets by mouth daily as needed (pain).   NONFORMULARY OR COMPOUNDED ITEM Take 1 tablet by mouth at bedtime. Cbd /delta 9 1 po qhs   omeprazole (PRILOSEC) 20 MG capsule Take 1 capsule (20 mg total) by mouth daily.   repaglinide (PRANDIN) 1 MG tablet Take 2 tablets (2 mg total) by mouth daily. Before evening meal.   rosuvastatin (CRESTOR) 40 MG tablet Take 1 tablet (40 mg  total) by mouth at bedtime.   Semaglutide (RYBELSUS) 7 MG TABS Take 1 tablet (7 mg total) by mouth daily.   No current facility-administered medications on file prior to visit.     Allergies  Allergen Reactions  Codeine Nausea Only    Severe nausea   Augmentin [Amoxicillin-Pot Clavulanate]     Severe nausea and vomitting    Review Of Systems:  Constitutional:   No  weight loss, night sweats,  Fevers, chills, fatigue, or  lassitude.  HEENT:   No headaches,  Difficulty swallowing,  Tooth/dental problems, or  Sore throat,                No sneezing, itching, ear ache, nasal congestion, post nasal drip,   CV:  No chest pain,  Orthopnea, PND, swelling in lower extremities, anasarca, dizziness, palpitations, syncope.   GI  No heartburn, indigestion, abdominal pain, nausea, vomiting, diarrhea, change in bowel habits, loss of appetite, bloody stools.   Resp: No shortness of breath with exertion or at rest.  No excess mucus, no productive cough,  No non-productive cough,  No coughing up of blood.  No change in color of mucus.  No wheezing.  No chest wall deformity  Skin: no rash or lesions.  GU: no dysuria, change in color of urine, no urgency or frequency.  No flank pain, no hematuria   MS:  No joint pain or swelling.  No decreased range of motion.  No back pain.  Psych:  No change in mood or affect. No depression or anxiety.  No memory loss.   Vital Signs BP 125/68 (BP Location: Left Arm, Patient Position: Sitting, Cuff Size: Normal)   Pulse 82   Temp (!) 97.1 F (36.2 C) (Temporal)   Ht 5\' 6"  (1.676 m)   Wt 140 lb 9.6 oz (63.8 kg)   SpO2 99%   BMI 22.69 kg/m    Physical Exam:  General- No distress,  A&Ox3, pleasant ENT: No sinus tenderness, TM clear, pale nasal mucosa, no oral exudate,no post nasal drip, no LAN Cardiac: S1, S2, regular rate and rhythm, no murmur Chest: No wheeze/ rales/ dullness; no accessory muscle use, no nasal flaring, no sternal retractions,  slightly diminished per bases Abd.: Soft Non-tender, masses positive, nondistended,Body mass index is 22.69 kg/m.  Ext: No clubbing cyanosis, edema, no obvious deformities Neuro:  normal strength, moving all extremities x 4, alert and oriented x 3, appropriate Skin: No rashes, warm and dry, no obvious lesions Psych: normal mood and behavior   Assessment/Plan Pulmonary Nodule Hypermetabolic nodule on PET scan with atypical cells on biopsy. No symptoms of hemoptysis, fever, or worsening shortness of breath. Unintentional weight loss over the past six months, possibly related to diabetes medication (Rybelsus). Discussed that atypical cells are not normal cells. -Schedule a follow-up CT in three months (mid-May 2025) to monitor the nodule very closely for growth or changes. -Follow-up with in a week of CT chest to review results and determine next steps in plan of care -If there is blood in sputum or continued weight loss, patient to call for an earlier appointment.  Smoking History Former smoker, quit in January 2017. Currently using vaping products, but plans to quit. -Encouraged cessation of vaping.  Diabetes Currently on Rybelsus, which may be contributing to unintentional weight loss. Patient considering switching to injectable medication. -Continue current medication regimen and monitor weight. -Consider medication adjustment if weight loss continues. -All care per primary care team  Follow-up -Review results one week after the three-month follow-up CT scan. -If the nodule remains stable, schedule a six-month follow-up through the program.  I spent 20 minutes dedicated to the care of this patient on the date of this encounter to include pre-visit review of records,  face-to-face time with the patient discussing conditions above, post visit ordering of testing, clinical documentation with the electronic health record, making appropriate referrals as documented, and communicating  necessary information to the patient's healthcare team.    Bevelyn Ngo, NP 08/14/2023  11:17 AM

## 2023-08-14 NOTE — Patient Instructions (Addendum)
It is good to see you today. You biopsy showed that the cells in the right lower lobe lung nodule are atypical cells. These are not normal cells. We will continue to watch this lung nodule closely. I have ordered a 3 month follow up Ct chest to monitor for any growth.  You will get a call to get this scheduled.  You will follow up with me about 1 week after to review the results.  Call if you develop any blood in your sputum, or have continued weight loss not related to your medication, call tyo be seen sooner.  Please contact office for sooner follow up if symptoms do not improve or worsen or seek emergency care

## 2023-08-17 ENCOUNTER — Telehealth: Payer: Self-pay

## 2023-08-17 NOTE — Telephone Encounter (Signed)
PAP: Patient assistance application for Rybelsus has been approved by P[employer] Companies: [employer] from 06/27/2023 to 06/25/2024. Medication should be delivered to PAP Delivery: Provider's office. For further shipping updates, please contact [employer] at [phone] at [phone]. Patient ID is: [id number]

## 2023-08-27 DIAGNOSIS — H401131 Primary open-angle glaucoma, bilateral, mild stage: Secondary | ICD-10-CM | POA: Diagnosis not present

## 2023-08-27 DIAGNOSIS — E113293 Type 2 diabetes mellitus with mild nonproliferative diabetic retinopathy without macular edema, bilateral: Secondary | ICD-10-CM | POA: Diagnosis not present

## 2023-08-27 DIAGNOSIS — Z961 Presence of intraocular lens: Secondary | ICD-10-CM | POA: Diagnosis not present

## 2023-08-29 LAB — HM DIABETES EYE EXAM

## 2023-08-30 ENCOUNTER — Telehealth: Payer: Self-pay

## 2023-08-30 NOTE — Telephone Encounter (Signed)
 Spoke with patient and advised Rybelsus has arrived. Placed at the front. Pt states she will pick up tomorrow or Monday

## 2023-09-01 DIAGNOSIS — J3489 Other specified disorders of nose and nasal sinuses: Secondary | ICD-10-CM | POA: Diagnosis not present

## 2023-09-01 DIAGNOSIS — J029 Acute pharyngitis, unspecified: Secondary | ICD-10-CM | POA: Diagnosis not present

## 2023-09-01 DIAGNOSIS — J329 Chronic sinusitis, unspecified: Secondary | ICD-10-CM | POA: Diagnosis not present

## 2023-09-07 ENCOUNTER — Other Ambulatory Visit: Payer: Self-pay | Admitting: "Endocrinology

## 2023-09-07 DIAGNOSIS — E119 Type 2 diabetes mellitus without complications: Secondary | ICD-10-CM

## 2023-09-12 ENCOUNTER — Other Ambulatory Visit: Payer: Self-pay

## 2023-09-12 DIAGNOSIS — E119 Type 2 diabetes mellitus without complications: Secondary | ICD-10-CM

## 2023-09-12 MED ORDER — REPAGLINIDE 1 MG PO TABS: 2.0000 mg | ORAL_TABLET | Freq: Every day | ORAL | 0 refills | Status: DC

## 2023-10-08 ENCOUNTER — Other Ambulatory Visit: Payer: Self-pay | Admitting: Family Medicine

## 2023-10-08 DIAGNOSIS — Z1231 Encounter for screening mammogram for malignant neoplasm of breast: Secondary | ICD-10-CM

## 2023-11-01 ENCOUNTER — Other Ambulatory Visit: Payer: Self-pay | Admitting: Family Medicine

## 2023-11-01 DIAGNOSIS — K219 Gastro-esophageal reflux disease without esophagitis: Secondary | ICD-10-CM

## 2023-11-08 ENCOUNTER — Other Ambulatory Visit: Payer: Self-pay | Admitting: Medical Genetics

## 2023-11-08 ENCOUNTER — Ambulatory Visit
Admission: RE | Admit: 2023-11-08 | Discharge: 2023-11-08 | Disposition: A | Discharge status: Home / Self Care | Payer: Medicare Other | Source: Ambulatory Visit | Attending: Acute Care | Admitting: Acute Care

## 2023-11-08 DIAGNOSIS — R911 Solitary pulmonary nodule: Secondary | ICD-10-CM | POA: Diagnosis not present

## 2023-11-14 ENCOUNTER — Ambulatory Visit
Admission: RE | Admit: 2023-11-14 | Discharge: 2023-11-14 | Disposition: A | Discharge status: Home / Self Care | Source: Ambulatory Visit | Attending: Family Medicine | Admitting: Family Medicine

## 2023-11-14 ENCOUNTER — Emergency Department (HOSPITAL_BASED_OUTPATIENT_CLINIC_OR_DEPARTMENT_OTHER)

## 2023-11-14 ENCOUNTER — Encounter (HOSPITAL_BASED_OUTPATIENT_CLINIC_OR_DEPARTMENT_OTHER): Payer: Self-pay | Admitting: Emergency Medicine

## 2023-11-14 ENCOUNTER — Emergency Department (HOSPITAL_BASED_OUTPATIENT_CLINIC_OR_DEPARTMENT_OTHER): Admitting: Radiology

## 2023-11-14 ENCOUNTER — Emergency Department (HOSPITAL_BASED_OUTPATIENT_CLINIC_OR_DEPARTMENT_OTHER)
Admission: EM | Admit: 2023-11-14 | Discharge: 2023-11-14 | Disposition: A | Discharge status: Home / Self Care | Attending: Emergency Medicine | Admitting: Emergency Medicine

## 2023-11-14 ENCOUNTER — Other Ambulatory Visit: Payer: Self-pay

## 2023-11-14 DIAGNOSIS — W01198A Fall on same level from slipping, tripping and stumbling with subsequent striking against other object, initial encounter: Secondary | ICD-10-CM | POA: Insufficient documentation

## 2023-11-14 DIAGNOSIS — S0990XA Unspecified injury of head, initial encounter: Secondary | ICD-10-CM | POA: Insufficient documentation

## 2023-11-14 DIAGNOSIS — S199XXA Unspecified injury of neck, initial encounter: Secondary | ICD-10-CM | POA: Diagnosis not present

## 2023-11-14 DIAGNOSIS — Y92481 Parking lot as the place of occurrence of the external cause: Secondary | ICD-10-CM | POA: Insufficient documentation

## 2023-11-14 DIAGNOSIS — S59902A Unspecified injury of left elbow, initial encounter: Secondary | ICD-10-CM | POA: Diagnosis not present

## 2023-11-14 DIAGNOSIS — Z7982 Long term (current) use of aspirin: Secondary | ICD-10-CM | POA: Insufficient documentation

## 2023-11-14 DIAGNOSIS — M25522 Pain in left elbow: Secondary | ICD-10-CM | POA: Diagnosis not present

## 2023-11-14 DIAGNOSIS — Z1231 Encounter for screening mammogram for malignant neoplasm of breast: Secondary | ICD-10-CM | POA: Diagnosis not present

## 2023-11-14 DIAGNOSIS — Y9301 Activity, walking, marching and hiking: Secondary | ICD-10-CM | POA: Diagnosis not present

## 2023-11-14 DIAGNOSIS — W19XXXA Unspecified fall, initial encounter: Secondary | ICD-10-CM

## 2023-11-14 DIAGNOSIS — Z981 Arthrodesis status: Secondary | ICD-10-CM | POA: Diagnosis not present

## 2023-11-14 DIAGNOSIS — I672 Cerebral atherosclerosis: Secondary | ICD-10-CM | POA: Diagnosis not present

## 2023-11-14 NOTE — ED Triage Notes (Signed)
 Mechanical fall this morning. Hit head. Denies LOC. Brief period of confusion after hitting head. Takes aspirin . No deficits. Aslo hit left elbow.

## 2023-11-14 NOTE — Discharge Instructions (Signed)
 Return to ER for worsening or concerning symptoms Follow up with your primary care provider. Rest today- avoid TV, computer, phone screens.

## 2023-11-14 NOTE — ED Notes (Signed)
 Discharge paperwork given and verbally understood.

## 2023-11-14 NOTE — ED Provider Notes (Signed)
 [place] [facility]ERGENCY DEPARTMENT AT [facility] Provider Note   CSN: [id number] Arrival date & time: 11/14/23  1027     History  Chief Complaint  Patient presents with   Alyssa Ford is a 73 y.o. female.  73 year old female with injuries from a fall. States she was walking through bushes in a parking lot when she tripped and fell, hit her head on cement. No LOC, did hit head, not on blood thinners. Was able to stand with help from bystanders. Patient was then driving and had a very brief moment where she wasn't sure where she was until she saw the street sign. Reports pain in the left elbow. Had tooth extracted yesterday.        Home Medications Prior to Admission medications   Medication Sig Start Date End Date Taking? Authorizing Provider  amitriptyline  (ELAVIL ) 25 MG tablet Take 2 tablets (50 mg total) by mouth at bedtime. 08/07/23   Denson Flake, MD  aspirin  EC 81 MG tablet Take 81 mg by mouth daily.    Crecencio Dodge, Yvonne R, DO  BIOTIN PO Take 1 tablet by mouth daily.     [provider]  Coenzyme Q10 (CO Q 10 PO) Take 1 capsule by mouth daily.    [provider]  fenofibrate  160 MG tablet TAKE 1 TABLET BY MOUTH DAILY 07/30/23   Crecencio Dodge, Yvonne R, DO  Ferrous Sulfate (IRON SUPPLEMENT PO) Take 1 tablet by mouth daily. 65mg     [provider]  fluticasone  (FLONASE ) 50 MCG/ACT nasal spray Place 2 sprays into both nostrils daily as needed for allergies. 08/07/23   Byrum, Robert S, MD  ipratropium (ATROVENT ) 0.06 % nasal spray Place 2 sprays into both nostrils 3 (three) times daily as needed for rhinitis. 08/02/23   Lowne Chase, Yvonne R, DO  latanoprost  (XALATAN ) 0.005 % ophthalmic solution Place 1 drop into both eyes at bedtime.  10/17/13   [provider]  lisinopril  (ZESTRIL ) 5 MG tablet Take 1 tablet (5 mg total) by mouth daily. 08/02/23   Crecencio Dodge, Candida Chalk, DO  metFORMIN  (GLUCOPHAGE -XR) 500 MG 24 hr tablet TAKE 4  TABLETS BY MOUTH EVERY DAY 08/02/23   Crecencio Dodge, Yvonne R, DO  montelukast  (SINGULAIR ) 10 MG tablet Take 1 tablet (10 mg total) by mouth at bedtime. 08/02/23   Lowne Chase, Yvonne R, DO  Naproxen Sodium (ALEVE PO) Take 2 tablets by mouth daily as needed (pain).    [provider]  NONFORMULARY OR COMPOUNDED ITEM Take 1 tablet by mouth at bedtime. Cbd /delta 9 1 po qhs 08/07/23   Byrum, Robert S, MD  omeprazole  (PRILOSEC) 20 MG capsule TAKE 1 CAPSULE BY MOUTH DAILY 11/02/23   Crecencio Dodge, Yvonne R, DO  repaglinide  (PRANDIN ) 1 MG tablet Take 2 tablets (2 mg total) by mouth daily. Before evening meal. 09/12/23   Motwani, Joanna Muck, MD  rosuvastatin  (CRESTOR ) 40 MG tablet Take 1 tablet (40 mg total) by mouth at bedtime. 08/02/23   Estill Hemming, DO  Semaglutide  (RYBELSUS ) 7 MG TABS Take 1 tablet (7 mg total) by mouth daily. 05/02/23   Estill Hemming, DO      Allergies    Codeine and Augmentin  [amoxicillin -pot clavulanate]    Review of Systems   Review of Systems Negative except as per HPI Physical Exam Updated Vital Signs BP 116/78 (BP Location: Right Arm)   Pulse 69   Temp 98.5 F (36.9  C)   Resp 18   SpO2 97%  Physical Exam Vitals and nursing note reviewed.  Constitutional:      General: She is not in acute distress.    Appearance: She is well-developed. She is not diaphoretic.  HENT:     Head: Normocephalic and atraumatic.     Nose: Nose normal.     Mouth/Throat:     Mouth: Mucous membranes are moist.  Eyes:     Extraocular Movements: Extraocular movements intact.     Conjunctiva/sclera: Conjunctivae normal.     Pupils: Pupils are equal, round, and reactive to light.  Cardiovascular:     Pulses: Normal pulses.  Pulmonary:     Effort: Pulmonary effort is normal.  Musculoskeletal:        General: No tenderness or deformity. Normal range of motion.     Right shoulder: Normal.     Left shoulder: Normal.     Left elbow: Normal. No deformity. Normal range of motion.  No tenderness.     Left wrist: Normal.     Cervical back: Normal range of motion and neck supple. No tenderness or bony tenderness. No pain with movement. Normal range of motion.     Thoracic back: No tenderness or bony tenderness.     Lumbar back: No tenderness or bony tenderness.     Right hip: Normal.     Left hip: Normal.     Right knee: Normal.     Left knee: Normal.     Right ankle: Normal.     Left ankle: Normal.  Skin:    General: Skin is warm and dry.     Findings: No bruising, erythema or rash.  Neurological:     Mental Status: She is alert and oriented to person, place, and time.  Psychiatric:        Behavior: Behavior normal.     ED Results / Procedures / Treatments   Labs (all labs ordered are listed, but only abnormal results are displayed) Labs Reviewed - No data to display  EKG None  Radiology CT Head Wo Contrast Result Date: 11/14/2023 CLINICAL DATA:  Mechanical fall with head trauma EXAM: CT HEAD WITHOUT CONTRAST CT CERVICAL SPINE WITHOUT CONTRAST TECHNIQUE: Multidetector CT imaging of the head and cervical spine was performed following the standard protocol without intravenous contrast. Multiplanar CT image reconstructions of the cervical spine were also generated. RADIATION DOSE REDUCTION: This exam was performed according to the departmental dose-optimization program which includes automated exposure control, adjustment of the mA and/or kV according to patient size and/or use of iterative reconstruction technique. COMPARISON:  02/21/2019 FINDINGS: CT HEAD FINDINGS Brain: There is no mass, hemorrhage or extra-axial collection. The size and configuration of the ventricles and extra-axial CSF spaces are normal. There is hypoattenuation of the white matter, most commonly indicating chronic small vessel disease. Vascular: No abnormal hyperdensity of the major intracranial arteries or dural venous sinuses. Mild intracranial atherosclerosis. Skull: The visualized skull  base, calvarium and extracranial soft tissues are normal. Sinuses/Orbits: No fluid levels or advanced mucosal thickening of the visualized paranasal sinuses. No mastoid or middle ear effusion. Normal orbits. Other: None. CT CERVICAL SPINE FINDINGS Alignment: No static subluxation. Facets are aligned. Occipital condyles are normally positioned. Skull base and vertebrae: No acute fracture. C4-6 ACDF without adverse features. Soft tissues and spinal canal: No prevertebral fluid or swelling. No visible canal hematoma. Disc levels: No advanced spinal canal or neural foraminal stenosis. Upper chest: No pneumothorax, pulmonary nodule or pleural  effusion. Other: Normal visualized paraspinal cervical soft tissues. IMPRESSION: 1. No acute intracranial abnormality. 2. Chronic small vessel disease. 3. No acute fracture or static subluxation of the cervical spine. Electronically Signed   By: Juanetta Nordmann M.D.   On: 11/14/2023 12:33   CT Cervical Spine Wo Contrast Result Date: 11/14/2023 CLINICAL DATA:  Mechanical fall with head trauma EXAM: CT HEAD WITHOUT CONTRAST CT CERVICAL SPINE WITHOUT CONTRAST TECHNIQUE: Multidetector CT imaging of the head and cervical spine was performed following the standard protocol without intravenous contrast. Multiplanar CT image reconstructions of the cervical spine were also generated. RADIATION DOSE REDUCTION: This exam was performed according to the departmental dose-optimization program which includes automated exposure control, adjustment of the mA and/or kV according to patient size and/or use of iterative reconstruction technique. COMPARISON:  02/21/2019 FINDINGS: CT HEAD FINDINGS Brain: There is no mass, hemorrhage or extra-axial collection. The size and configuration of the ventricles and extra-axial CSF spaces are normal. There is hypoattenuation of the white matter, most commonly indicating chronic small vessel disease. Vascular: No abnormal hyperdensity of the major intracranial  arteries or dural venous sinuses. Mild intracranial atherosclerosis. Skull: The visualized skull base, calvarium and extracranial soft tissues are normal. Sinuses/Orbits: No fluid levels or advanced mucosal thickening of the visualized paranasal sinuses. No mastoid or middle ear effusion. Normal orbits. Other: None. CT CERVICAL SPINE FINDINGS Alignment: No static subluxation. Facets are aligned. Occipital condyles are normally positioned. Skull base and vertebrae: No acute fracture. C4-6 ACDF without adverse features. Soft tissues and spinal canal: No prevertebral fluid or swelling. No visible canal hematoma. Disc levels: No advanced spinal canal or neural foraminal stenosis. Upper chest: No pneumothorax, pulmonary nodule or pleural effusion. Other: Normal visualized paraspinal cervical soft tissues. IMPRESSION: 1. No acute intracranial abnormality. 2. Chronic small vessel disease. 3. No acute fracture or static subluxation of the cervical spine. Electronically Signed   By: Juanetta Nordmann M.D.   On: 11/14/2023 12:33   DG Elbow Complete Left Result Date: 11/14/2023 CLINICAL DATA:  Pain after fall EXAM: LEFT ELBOW - COMPLETE 4 VIEW COMPARISON:  None Available. FINDINGS: There is no evidence of fracture, dislocation, or joint effusion. There is no evidence of arthropathy or other focal bone abnormality. Soft tissues are unremarkable. IMPRESSION: No acute osseous abnormality. Electronically Signed   By: Adrianna Horde M.D.   On: 11/14/2023 12:17    Procedures Procedures    Medications Ordered in ED Medications - No data to display  ED Course/ Medical Decision Making/ A&P                                 Medical Decision Making Amount and/or Complexity of Data Reviewed Radiology: ordered.   73 year old female presents for evaluation after a fall today.  She reports some discomfort in her left elbow and states that she did hit her head.  She does not have any external signs of trauma to her extremities,  head/face.  CT scan of her head and C-spine are negative for acute traumatic injuries.  X-ray of the left elbow as ordered for myself is negative, agree with radiologist interpretation of these images.  Exam of the left elbow is reassuring, there is no point tenderness crepitus, ecchymosis, she has normal range of motion.  Normal range of motion noted to upper and lower extremities, no pain with palpation.  There is no neck or back tenderness.  Patient was concerned that she  had a brief episode of confusion while driving, this lasted a few seconds and resolved when she looked at the street sign and then realized where she was.  She has been in the emergency room for nearly 3 hours, mentation normal per family at bedside, she is alert and oriented.  Discussed potential for concussion although advised family and patient that if she were to have change in mental status or other concerns she should return to the emergency room otherwise recommend follow-up with primary care provider for recheck.  Recommend rest from screens to include TV/phone/computer.        Final Clinical Impression(s) / ED Diagnoses Final diagnoses:  Fall, initial encounter  Left elbow pain  Closed head injury, initial encounter    Rx / DC Orders ED Discharge Orders     None         Darlis Eisenmenger, PA-C 11/14/23 1317    Tegeler, Marine Sia, MD 11/14/23 1547

## 2023-11-15 ENCOUNTER — Other Ambulatory Visit (HOSPITAL_COMMUNITY)

## 2023-11-20 ENCOUNTER — Ambulatory Visit: Payer: Self-pay

## 2023-11-20 NOTE — Telephone Encounter (Signed)
  Chief Complaint: Elevated morning blood sugar - recent fall follow up Symptoms: none Frequency: Ongoing - fall was 11/14/2023 Pertinent Negatives: Patient denies  Disposition: [] ED /[] Urgent Care (no appt availability in office) / [x] Appointment(In office/virtual)/ []  [facility]/ [] Home Care/ [] Refused Recommended Disposition /[] [facility]/ []  Follow-up with PCP Additional Notes: Pt is reporting elevated morning blood sugars of 140 - 150. Pt reports that blood sugars are better in the afternoon. Pt is taking her medication as prescribed. She is having no s/s. Pt tripped over a root on 5/21 and hit her head on concrete. Pt was seen at ED after feeling a little dizzy. Pt was released and has had not further issues. Monitor both BP and head injury and call back if needed. Pt has scheduled appt for Friday.     Copied from CRM #[id number]. Topic: Clinical - Red Word Triage >> Nov 20, 2023 10:02 AM Howard Macho wrote: Reason for JYN:WGNFAOZ had a fall last week and she was told to follow up and her blood sugar is running high Reason for Disposition  Blood glucose 70-240 mg/dL (3.9 -30.8 mmol/L)  Answer Assessment - Initial Assessment Questions 1. BLOOD GLUCOSE: "What is your blood glucose level?"      Mornings 140-150 2. ONSET: "When did you check the blood glucose?"     Every other day 3. USUAL RANGE: "What is your glucose level usually?" (e.g., usual fasting morning value, usual evening value)     In the morning 140-150. Afternoons BS is better  5. TYPE 1 or 2:  "Do you know what type of diabetes you have?"  (e.g., Type 1, Type 2, Gestational; doesn't know)      2 6. INSULIN : "Do you take insulin ?" "What type of insulin (s) do you use? What is the mode of delivery? (syringe, pen; injection or pump)?"      no 7. DIABETES PILLS: "Do you take any pills for your diabetes?" If Yes, ask: "Have you missed taking any pills recently?"     Yes - no 8. OTHER SYMPTOMS: "Do you have  any symptoms?" (e.g., fever, frequent urination, difficulty breathing, dizziness, weakness, vomiting)     no  Protocols used: Diabetes - High Blood Sugar-A-AH

## 2023-11-23 ENCOUNTER — Ambulatory Visit (INDEPENDENT_AMBULATORY_CARE_PROVIDER_SITE_OTHER): Admitting: Family Medicine

## 2023-11-23 ENCOUNTER — Encounter: Payer: Self-pay | Admitting: Family Medicine

## 2023-11-23 VITALS — BP 148/100 | HR 72 | Temp 97.5°F | Resp 16 | Ht 65.0 in | Wt 142.0 lb

## 2023-11-23 DIAGNOSIS — I10 Essential (primary) hypertension: Secondary | ICD-10-CM | POA: Diagnosis not present

## 2023-11-23 DIAGNOSIS — E039 Hypothyroidism, unspecified: Secondary | ICD-10-CM

## 2023-11-23 DIAGNOSIS — E785 Hyperlipidemia, unspecified: Secondary | ICD-10-CM

## 2023-11-23 DIAGNOSIS — E1169 Type 2 diabetes mellitus with other specified complication: Secondary | ICD-10-CM | POA: Diagnosis not present

## 2023-11-23 DIAGNOSIS — W19XXXA Unspecified fall, initial encounter: Secondary | ICD-10-CM | POA: Diagnosis not present

## 2023-11-23 DIAGNOSIS — E119 Type 2 diabetes mellitus without complications: Secondary | ICD-10-CM

## 2023-11-23 LAB — COMPREHENSIVE METABOLIC PANEL WITH GFR
ALT: 14 U/L (ref 0–35)
AST: 16 U/L (ref 0–37)
Albumin: 4.6 g/dL (ref 3.5–5.2)
Alkaline Phosphatase: 44 U/L (ref 39–117)
BUN: 19 mg/dL (ref 6–23)
CO2: 27 meq/L (ref 19–32)
Calcium: 9.6 mg/dL (ref 8.4–10.5)
Chloride: 103 meq/L (ref 96–112)
Creatinine, Ser: 0.97 mg/dL (ref 0.40–1.20)
GFR: 57.9 mL/min — ABNORMAL LOW (ref >60.00)
Glucose, Bld: 135 mg/dL — ABNORMAL HIGH (ref 70–99)
Potassium: 3.8 meq/L (ref 3.5–5.1)
Sodium: 140 meq/L (ref 135–145)
Total Bilirubin: 0.3 mg/dL (ref 0.2–1.2)
Total Protein: 7 g/dL (ref 6.0–8.3)

## 2023-11-23 LAB — CBC WITH DIFFERENTIAL/PLATELET
Basophils Absolute: 0 10*3/uL (ref 0.0–0.1)
Basophils Relative: 0.6 % (ref 0.0–3.0)
Eosinophils Absolute: 0.1 10*3/uL (ref 0.0–0.7)
Eosinophils Relative: 2.1 % (ref 0.0–5.0)
HCT: 36 % (ref 36.0–46.0)
Hemoglobin: 11.9 g/dL — ABNORMAL LOW (ref 12.0–15.0)
Lymphocytes Relative: 35.4 % (ref 12.0–46.0)
Lymphs Abs: 2.3 10*3/uL (ref 0.7–4.0)
MCHC: 33 g/dL (ref 30.0–36.0)
MCV: 84 fl (ref 78.0–100.0)
Monocytes Absolute: 0.5 10*3/uL (ref 0.1–1.0)
Monocytes Relative: 7.4 % (ref 3.0–12.0)
Neutro Abs: 3.6 10*3/uL (ref 1.4–7.7)
Neutrophils Relative %: 54.5 % (ref 43.0–77.0)
Platelets: 260 10*3/uL (ref 150.0–400.0)
RBC: 4.28 Mil/uL (ref 3.87–5.11)
RDW: 16.1 % — ABNORMAL HIGH (ref 11.5–15.5)
WBC: 6.6 10*3/uL (ref 4.0–10.5)

## 2023-11-23 LAB — LIPID PANEL
Cholesterol: 136 mg/dL (ref 0–200)
HDL: 46.9 mg/dL (ref >39.00)
LDL Cholesterol: 66 mg/dL (ref 0–99)
NonHDL: 88.91
Total CHOL/HDL Ratio: 3
Triglycerides: 115 mg/dL (ref 0.0–149.0)
VLDL: 23 mg/dL (ref 0.0–40.0)

## 2023-11-23 LAB — HEMOGLOBIN A1C: Hgb A1c MFr Bld: 6.6 % — ABNORMAL HIGH (ref 4.6–6.5)

## 2023-11-23 LAB — MICROALBUMIN / CREATININE URINE RATIO
Creatinine,U: 32.2 mg/dL
Microalb Creat Ratio: 22.1 mg/g (ref 0.0–30.0)
Microalb, Ur: 0.7 mg/dL (ref 0.0–1.9)

## 2023-11-23 NOTE — Assessment & Plan Note (Signed)
 Well controlled, no changes to meds. Encouraged heart healthy diet such as the DASH diet and exercise as tolerated.

## 2023-11-23 NOTE — Progress Notes (Signed)
 Established Patient Office Visit  Subjective   Patient ID: Alyssa Ford, female    DOB: Jun 02, 1951  Age: 73 y.o. MRN: [id number]  Chief Complaint  Patient presents with   Fall    Fall on 21st, no injury, pt states she tripped on a root    HPI Discussed the use of AI scribe software for clinical note transcription with the patient, who gave verbal consent to proceed.  History of Present Illness Alyssa Ford is a 73 year old female who presents with concerns about a lung nodule and recent fall.  She experienced a fall after a mammogram appointment, tripping over a root while navigating through bushes in a parking area, resulting in a fall into the dirt. She had dirt in her teeth at the emergency room. A tooth was extracted last week, which she associates with the fall, and she is concerned about potential infection. Bruises from the fall have mostly resolved, which she describes as 'old people bruises'.  A recent CT scan indicated a lung nodule, which she discovered through her online medical chart. She is concerned about the possibility of stage one cancer as the nodule has grown since her last scan. A biopsy was performed in February, and her next appointment is not until July 8th. She has not yet been seen by oncology.  Her blood sugar levels were high last week but have improved this week. Her blood sugar was 124 at the emergency room. She recalls her A1c was slightly elevated during her last check in February.  No current symptoms of infection from the tooth extraction and no new bruises or ongoing issues from the fall.   Patient Active Problem List   Diagnosis Date Noted   Fall 11/23/2023   Pulmonary nodule 08/07/2023   Chronic rhinitis 04/03/2023   Nausea 01/15/2023   Iron deficiency anemia 01/15/2023   Hypokalemia 12/14/2022   Community acquired pneumonia of left lower lobe of lung 11/26/2022   Septic shock (HCC) 11/26/2022   Slow transit constipation 08/18/2022    Preventative health care 02/05/2022   Pulmonary HTN (HCC) 07/04/2021   Hyperlipidemia associated with type 2 diabetes mellitus (HCC) 07/04/2021   Atherosclerosis of aorta (HCC) 07/04/2021   Angioma of skin 06/02/2021   Vaginal itching 05/23/2021   Hematuria 05/23/2021   Insomnia 05/13/2021   Hypothyroidism 08/24/2020   Chest pain 08/06/2020   Trichomoniasis 04/27/2020   Cervical cancer screening 04/27/2020   Left thyroid  nodule 04/13/2020   Vaginal odor 01/11/2020   Icterus 01/11/2020   Controlled type 2 diabetes mellitus with hyperglycemia, without long-term current use of insulin  (HCC) 09/22/2019   De Quervain's disease (tenosynovitis) 09/22/2019   Neck mass 07/11/2018   Abnormal WBC count 05/29/2017   Skin fissure 09/24/2015   Allergic rhinitis 03/26/2014   Wheezing 03/26/2014   DM (diabetes mellitus) type II uncontrolled, periph vascular disorder 08/25/2011   Headache 08/21/2011   Acute sinusitis 04/26/2010   URI 04/26/2010   UNSPECIFIED CATARACT 02/23/2010   POSTMENOPAUSAL STATUS 12/12/2007   FATIGUE 09/11/2007   THYROID  NODULE 11/22/2006   Controlled type 2 diabetes mellitus without complication, without long-term current use of insulin  (HCC) 11/22/2006   Hyperlipidemia LDL goal <70 11/22/2006   GLUCOMA 11/22/2006   Essential hypertension 11/22/2006   GERD 11/22/2006   Past Medical History:  Diagnosis Date   Anemia    Anxiety    Arthritis    Cataract    Chronic headaches    pt states that they have improved -  08/06/23   Coronary artery disease    Depression    Diabetes mellitus    Diverticulosis of colon (without mention of hemorrhage) 2003, 2008   Colonoscopy   Eczema    Emphysema of lung (HCC)    GERD (gastroesophageal reflux disease)    Glaucoma    Hiatal hernia 2003   EGD    History of pneumonia 11/2022   Hyperlipidemia    Hypertension    PONV (postoperative nausea and vomiting)    Thyroid  nodule    Past Surgical History:  Procedure Laterality  Date   BRONCHIAL BIOPSY  08/07/2023   Procedure: BRONCHIAL BIOPSIES;  Surgeon: Denson Flake, MD;  Location: [facility];  Service: Pulmonary;;   BRONCHIAL BRUSHINGS  08/07/2023   Procedure: BRONCHIAL BRUSHINGS;  Surgeon: Denson Flake, MD;  Location: [facility] ENDOSCOPY;  Service: Pulmonary;;   BRONCHIAL NEEDLE ASPIRATION BIOPSY  08/07/2023   Procedure: BRONCHIAL NEEDLE ASPIRATION BIOPSIES;  Surgeon: Denson Flake, MD;  Location: [facility];  Service: Pulmonary;;   CARPAL TUNNEL RELEASE  2006   CARPAL TUNNEL RELEASE  12/24/2007   CATARACT EXTRACTION, BILATERAL     [facility]---  sept and oct 2018   CERVICAL FUSION  08/2006   CHOLECYSTECTOMY     COSMETIC SURGERY  December 09 2018   EYE SURGERY  catracts   FASCIECTOMY Left 08/01/2018   Procedure: FASCIECTOMY LEFT RING;  Surgeon: Lyanne Sample, MD;  Location: [facility];  Service: Orthopedics;  Laterality: Left;  UPPER ARM   FIDUCIAL MARKER PLACEMENT  08/07/2023   Procedure: FIDUCIAL MARKER PLACEMENT;  Surgeon: Denson Flake, MD;  Location: [facility] ENDOSCOPY;  Service: Pulmonary;;   SPINE SURGERY  neck dic replaced   TRIGGER FINGER RELEASE Left 08/01/2018   Procedure: RELEASE TRIGGER FINGER/A-1 PULLEY LEFTRING;  Surgeon: Lyanne Sample, MD;  Location: [facility];  Service: Orthopedics;  Laterality: Left;   TUBAL LIGATION     Social History   Tobacco Use   Smoking status: Former    Current packs/day: 0.00    Average packs/day: 1.4 packs/day for 49.0 years (68.6 ttl pk-yrs)    Types: Cigarettes    Start date: 07/15/1966    Quit date: 07/16/2015    Years since quitting: 8.3    Passive exposure: Past   Smokeless tobacco: Never   Tobacco comments:    Continues to vape but states pod do not contain nicotine  Vaping Use   Vaping status: Some Days  Substance Use Topics   Alcohol use: Yes    Alcohol/week: 0.0 standard drinks of alcohol    Comment: once weekly; wine and beer   Drug use: No   Social History   Socioeconomic  History   Marital status: Married    Spouse name: Not on file   Number of children: Not on file   Years of education: Not on file   Highest education level: Some college, no degree  Occupational History   Occupation: retired  Tobacco Use   Smoking status: Former    Current packs/day: 0.00    Average packs/day: 1.4 packs/day for 49.0 years (68.6 ttl pk-yrs)    Types: Cigarettes    Start date: 07/15/1966    Quit date: 07/16/2015    Years since quitting: 8.3    Passive exposure: Past   Smokeless tobacco: Never   Tobacco comments:    Continues to vape but states pod do not contain nicotine  Vaping Use   Vaping status: Some Days  Substance  and Sexual Activity   Alcohol use: Yes    Alcohol/week: 0.0 standard drinks of alcohol    Comment: once weekly; wine and beer   Drug use: No   Sexual activity: Not Currently    Partners: Male  Other Topics Concern   Not on file  Social History Narrative   Exercise--  walk   Social Drivers of Health   Financial Resource Strain: Low Risk  (07/26/2023)   Overall Financial Resource Strain (CARDIA)    Difficulty of Paying Living Expenses: Not very hard  Food Insecurity: No Food Insecurity (07/26/2023)   Hunger Vital Sign    Worried About Running Out of Food in the Last Year: Never true    Ran Out of Food in the Last Year: Never true  Transportation Needs: No Transportation Needs (07/26/2023)   PRAPARE - Administrator, Civil Service (Medical): No    Lack of Transportation (Non-Medical): No  Physical Activity: Insufficiently Active (07/26/2023)   Exercise Vital Sign    Days of Exercise per Week: 4 days    Minutes of Exercise per Session: 30 min  Stress: Stress Concern Present (07/26/2023)   [employer] of Occupational Health - Occupational Stress Questionnaire    Feeling of Stress : To some extent  Social Connections: Unknown (07/26/2023)   Social Connection and Isolation Panel [NHANES]    Frequency of Communication with  Friends and Family: More than three times a week    Frequency of Social Gatherings with Friends and Family: Twice a week    Attends Religious Services: Patient declined    Database administrator or Organizations: No    Attends Banker Meetings: Never    Marital Status: Married  Catering manager Violence: Not At Risk (02/08/2023)   Humiliation, Afraid, Rape, and Kick questionnaire    Fear of Current or Ex-Partner: No    Emotionally Abused: No    Physically Abused: No    Sexually Abused: No   Family Status  Relation Name Status   Mother  Deceased   Father  Deceased   Sister  Alive   Sister  Alive   Brother  Deceased at age 94       MI   Mat Aunt  Deceased at age 76   Cousin  Alive   Neg Hx  (Not Specified)  No partnership data on file   Family History  Problem Relation Age of Onset   Angioedema Mother    Dementia Mother    Breast cancer Mother 37   Kidney failure Mother 5       died from kidney failure from abx given to her for pneumonia   Hyperlipidemia Mother    Hypertension Mother    Diabetes Mother    Cancer Mother 67       breast   Parkinsonism Father    Diabetes Father 28   Hypertension Sister    Diabetes Brother    Hypertension Brother    Heart attack Brother    Heart disease Cousin 76       mi   Colon cancer Neg Hx    Esophageal cancer Neg Hx    Stomach cancer Neg Hx    Allergies  Allergen Reactions   Codeine Nausea Only    Severe nausea   Augmentin  [Amoxicillin -Pot Clavulanate]     Severe nausea and vomitting         ROS    Objective:     BP (!) 148/100 (BP  Location: Left Arm, Patient Position: Sitting, Cuff Size: Normal)   Pulse 72   Temp (!) 97.5 F (36.4 C) (Oral)   Resp 16   Ht 5\' 5"  (1.651 m)   Wt 142 lb (64.4 kg)   SpO2 97%   BMI 23.63 kg/m  BP Readings from Last 3 Encounters:  11/23/23 (!) 148/100  11/14/23 116/78  08/14/23 125/68   Wt Readings from Last 3 Encounters:  11/23/23 142 lb (64.4 kg)  08/14/23 140  lb 9.6 oz (63.8 kg)  08/07/23 139 lb (63 kg)   SpO2 Readings from Last 3 Encounters:  11/23/23 97%  11/14/23 97%  08/14/23 99%      Physical Exam   No results found for any visits on 11/23/23.  Last CBC Lab Results  Component Value Date   WBC 7.5 08/02/2023   HGB 11.6 (L) 08/02/2023   HCT 35.7 (L) 08/02/2023   MCV 84.9 08/02/2023   MCH 28.6 12/29/2022   RDW 14.7 08/02/2023   PLT 305.0 08/02/2023   Last metabolic panel Lab Results  Component Value Date   GLUCOSE 121 (H) 08/02/2023   NA 140 08/02/2023   K 3.4 (L) 08/02/2023   CL 99 08/02/2023   CO2 28 08/02/2023   BUN 17 08/02/2023   CREATININE 0.90 08/02/2023   GFR 63.48 08/02/2023   CALCIUM  8.6 08/02/2023   PHOS 8.7 (H) 11/27/2022   PROT 7.2 08/02/2023   ALBUMIN 4.4 08/02/2023   BILITOT 0.4 08/02/2023   ALKPHOS 51 08/02/2023   AST 14 08/02/2023   ALT 10 08/02/2023   ANIONGAP 14 12/29/2022   Last lipids Lab Results  Component Value Date   CHOL 112 08/02/2023   HDL 48.20 08/02/2023   LDLCALC 47 08/02/2023   LDLDIRECT 87.0 06/07/2020   TRIG 83.0 08/02/2023   CHOLHDL 2 08/02/2023   Last hemoglobin A1c Lab Results  Component Value Date   HGBA1C 6.6 (H) 08/02/2023   Last thyroid  functions Lab Results  Component Value Date   TSH 4.20 08/02/2023   Last vitamin D Lab Results  Component Value Date   VD25OH 43 02/23/2010   Last vitamin B12 and Folate Lab Results  Component Value Date   VITAMINB12 1,337 (H) 04/17/2019   FOLATE 6.7 09/11/2007      The ASCVD Risk score (Arnett DK, et al., 2019) failed to calculate for the following reasons:   The valid total cholesterol range is 130 to 320 mg/dL    Assessment & Plan:   Problem List Items Addressed This Visit       Unprioritized   Controlled type 2 diabetes mellitus without complication, without long-term current use of insulin  (HCC) - Primary   Relevant Orders   Comprehensive metabolic panel with GFR   Hemoglobin A1c   Microalbumin /  creatinine urine ratio   Hypothyroidism   Hyperlipidemia associated with type 2 diabetes mellitus (HCC)   Relevant Orders   Lipid panel   Comprehensive metabolic panel with GFR   Fall   Pt tripped over a root  She fell face first in dirt and went to er  Er records, labs and imaging removed       Essential hypertension   Well controlled, no changes to meds. Encouraged heart healthy diet such as the DASH diet and exercise as tolerated.        Relevant Orders   Lipid panel   CBC with Differential/Platelet   Comprehensive metabolic panel with GFR   Hemoglobin A1c   Microalbumin /  creatinine urine ratio  Assessment and Plan Assessment & Plan Lung nodule with suspicion of cancer   The lung nodule has increased in size, raising suspicion for stage I lung cancer. She has not yet seen oncology, with a follow-up delayed until July 8th. The nodule remains small, which is favorable for treatment outcomes. She is concerned about the potential diagnosis and delay. Previous discussions with her specialist mentioned radiation if the nodule grew. Surgical removal of the affected lung part is a potential treatment option, as seen in a similar case with a positive outcome. Contact Sarah's office to expedite the follow-up appointment. Discuss potential treatment options with oncology, including surgery and radiation.  Elevated blood sugar   Recent blood glucose levels have been elevated, possibly due to stress. The last HbA1c was checked in February, and she reports improvement in blood glucose levels this week. Blood glucose was 124 mg/dL at the emergency room visit. Recheck blood glucose levels to monitor for persistent elevation.  Small vessel disease   Chronic small vessel disease noted on CT scan, consistent with age-related changes. No acute findings were reported.    No follow-ups on file.    Jamillia Closson R Lowne Chase, DO

## 2023-11-23 NOTE — Assessment & Plan Note (Signed)
 Pt tripped over a root  She fell face first in dirt and went to er  Er records, labs and imaging removed

## 2023-11-25 ENCOUNTER — Other Ambulatory Visit: Payer: Self-pay | Admitting: Family Medicine

## 2023-11-25 DIAGNOSIS — E1169 Type 2 diabetes mellitus with other specified complication: Secondary | ICD-10-CM

## 2023-11-28 ENCOUNTER — Ambulatory Visit: Payer: Self-pay | Admitting: Family Medicine

## 2023-11-28 ENCOUNTER — Telehealth: Payer: Self-pay

## 2023-11-28 NOTE — Telephone Encounter (Signed)
 Copied from CRM [id number]. Topic: Clinical - Lab/Test Results >> Nov 28, 2023 10:35 AM Isabell A wrote: Reason for CRM: Patient requesting to speak with a nurse in regard to her CT results from 5/15.  Isa Manuel can you please advise of patients CT results. Thank you

## 2023-11-29 ENCOUNTER — Telehealth: Payer: Self-pay

## 2023-11-29 NOTE — Telephone Encounter (Signed)
 Pt called and advised that [facility]  has arrived and is ready for pickup. Med placed at the front.

## 2023-12-03 NOTE — Telephone Encounter (Signed)
 Called and spoke to pt. Scheduled OV with Dara Ear NP on 12/05/23 at 0830 to review results of CT Chest. Pt verbalized understanding and denied any further questions or concerns at this time.

## 2023-12-05 ENCOUNTER — Encounter: Payer: Self-pay | Admitting: Acute Care

## 2023-12-05 ENCOUNTER — Ambulatory Visit: Admitting: Acute Care

## 2023-12-05 ENCOUNTER — Telehealth: Payer: Self-pay | Admitting: Radiation Oncology

## 2023-12-05 VITALS — BP 121/74 | HR 73 | Ht 66.0 in | Wt 139.4 lb

## 2023-12-05 DIAGNOSIS — F17290 Nicotine dependence, other tobacco product, uncomplicated: Secondary | ICD-10-CM

## 2023-12-05 DIAGNOSIS — R911 Solitary pulmonary nodule: Secondary | ICD-10-CM

## 2023-12-05 DIAGNOSIS — Z9889 Other specified postprocedural states: Secondary | ICD-10-CM | POA: Diagnosis not present

## 2023-12-05 DIAGNOSIS — Z87891 Personal history of nicotine dependence: Secondary | ICD-10-CM

## 2023-12-05 NOTE — Progress Notes (Signed)
 History of Present Illness Alyssa Ford is a 73 y.o. female  former smoker followed through the lung cancer screening program for abnormal imaging. She was referred January 2025 for consultation regarding an enlarging right lower lobe pulmonary nodule that was PET avid, and concerning for stage Ia primary bronchogenic carcinoma.  Patient underwent bronchoscopy with biopsies on 08/07/2023 to further evaluate the nodule of concern.  Biopsy of the right lower lobe nodule brushing was negative for malignancy and the fine-needle aspiration showed atypical cells that favored reactive bronchial cells.  Plan was for 47-month follow-up surveillance scan to monitor nodule for any further growth.  Patient presents today to review 4-month surveillance CT.   12/05/2023 Patient presents for follow-up to review 59-month surveillance CT chest to reevaluate the right lower lobe pulmonary nodule that was negative for malignancy on biopsy in February 2025.  I have reviewed the scan with the patient.  The scan shows interval growth of the right lower lobe microlobulated nodule.  This is increased in size from 7 to 8 mm to 12 mm. There was notation of the metallic fiducial marker 1.5 cm anterior to and slightly inferior to the level of the nodule. There is no evidence of regional adenopathy or metastatic disease. There was notation of a stable 7 [name] groundglass nodule In the right upper lobe.  Patient states from the medical standpoint she has been doing well.  She quit smoking tobacco in 2017 however does continue to vape.  She states she quit vaping 2 weeks ago and is hoping that she is successful in quitting completely.  She denies any hemoptysis and her weight has been stable since previous visit.  Obviously this is a concerning result from her scan.  Patient is interested in having this nodule surgically removed.  I have referred her to thoracic surgery to be evaluated.  Pulmonary function test done in May 2023  showed air trapping without fixed obstruction.  Lung volumes and diffusion capacity are adequate for surgery.  I have asked thoracic surgery to let me know if they need another order for pulmonary function test to reevaluate.  I have also referred the patient to radiation oncology for consideration of SBRT without additional biopsy.  Patient had a PET scan that was hypermetabolic, and biopsy that showed atypical cells.  I will await their decision regarding treatment without definitive diagnosis.  Patient has been referred and she will see them in consultation.  Test Results: CT chest without contrast 11/08/2023 read 11/21/2023 Interval growth of the right lower lobe microlobulated nodular neoplasm, now 12 mm, previously 7-8 mm. 2. New metallic fiducial marker 1.5 cm anterior to and slightly inferior to the level of the nodule. 3. No evidence of regional adenopathy or metastatic disease. 4. Stable 7 mm ground-glass nodule laterally in the right upper lobe. 5. Aortic and coronary artery atherosclerosis. 6. Hepatic steatosis. 7. Nonobstructive right nephrolithiasis.  FINAL MICROSCOPIC DIAGNOSIS:  A.  RIGHT LUNG, LOWER LOBE, NODULE, FINE NEEDLE ASPIRATION:  - Atypical  - Atypical cells, favor reactive bronchial cells  - Benign alveolated lung (cellblock)   B.  RIGHT LUNG, LOWER LOBE, NODULE, BRUSHING:  - Negative for malignancy  - Very scant cellularity; predominantly blood with very rare benign  bronchial cells and pulmonary macrophages   PFT 10/2021                 Latest Ref Rng & Units 11/23/2023   11:46 AM 08/02/2023    9:40 AM 02/12/2023  9:07 AM  CBC  WBC 4.0 - 10.5 K/uL 6.6  7.5  5.9   Hemoglobin 12.0 - 15.0 g/dL 40.9  81.1  91.4   Hematocrit 36.0 - 46.0 % 36.0  35.7  33.6   Platelets 150.0 - 400.0 K/uL 260.0  305.0  267.0        Latest Ref Rng & Units 11/23/2023   11:46 AM 08/02/2023    9:40 AM 02/12/2023    9:07 AM  BMP  Glucose 70 - 99 mg/dL 782  956  213    BUN 6 - 23 mg/dL 19  17  22    Creatinine 0.40 - 1.20 mg/dL 0.86  5.78  4.69   Sodium 135 - 145 mEq/L 140  140  140   Potassium 3.5 - 5.1 mEq/L 3.8  3.4  4.0   Chloride 96 - 112 mEq/L 103  99  102   CO2 19 - 32 mEq/L 27  28  27    Calcium  8.4 - 10.5 mg/dL 9.6  8.6  9.3     BNP No results found for: BNP  ProBNP No results found for: PROBNP  PFT    Component Value Date/Time   FEV1PRE 1.76 11/02/2021 0858   FEV1POST 1.76 11/02/2021 0858   FVCPRE 2.27 11/02/2021 0858   FVCPOST 2.24 11/02/2021 0858   TLC 5.57 11/02/2021 0858   DLCOUNC 20.33 11/02/2021 0858   PREFEV1FVCRT 77 11/02/2021 0858   PSTFEV1FVCRT 79 11/02/2021 0858    CT CHEST WO CONTRAST Result Date: 11/21/2023 CLINICAL DATA:  Hypermetabolic right lower lobe nodule. PET-CT findings consistent with stage I A primary bronchogenic carcinoma. EXAM: CT CHEST WITHOUT CONTRAST TECHNIQUE: Multidetector CT imaging of the chest was performed following the standard protocol without IV contrast. RADIATION DOSE REDUCTION: This exam was performed according to the departmental dose-optimization program which includes automated exposure control, adjustment of the mA and/or kV according to patient size and/or use of iterative reconstruction technique. COMPARISON:  Post bronchoscopic biopsy portable chest 08/07/2023, PET-CT 07/06/2023, and chest CTs without contrast 06/13/2023 and 03/14/2023. FINDINGS: Cardiovascular: The cardiac size is normal. There is no pericardial effusion. There are three-vessel coronary calcifications, atherosclerosis in the aorta and great vessels and no aortic aneurysm. The pulmonary arteries and veins are normal caliber. Mediastinum/Nodes: 2.2 cm heterogeneous nodule inferiorly in the left lobe of the thyroid  gland, was evaluated on prior imaging most recently thyroid  ultrasound 07/25/2023 with no recommendation for follow-up or biopsy. Axillary spaces are clear. A borderline prominent precarinal lymph node, 8 mm in  short axis, is again noted but there is no adenopathy by short axis size criteria. Evaluation of the hila is limited without contrast but there is no new hilar contour abnormality. Lungs/Pleura: Right lower lobe microlobulated nodular neoplasm today measures 12 mm, on the last 2 CTS was 7-8 mm in size the consistent with interval growth. 1.5 cm anterior to and slightly inferior to the level of the nodule, there is new demonstration of a metallic fiducial marker placed in the lung tissue. There is minimal centrilobular emphysematous change in the upper lobes, mild biapical reticulated scarring. There is a faint 7 mm ground-glass nodule laterally in the right upper lobe on 5:46, stable. There is no consolidation, effusion or further nodules. There is mild bronchial thickening in the lower lobes. Upper Abdomen: Prior cholecystectomy. 4 mm nonobstructive caliceal stone superior pole right kidney. Abdominal aortic atherosclerosis. No acute upper abdominal findings. Mild hepatic steatosis. No adrenal mass. Musculoskeletal: No regional bone metastasis is seen. IMPRESSION:  1. Interval growth of the right lower lobe microlobulated nodular neoplasm, now 12 mm, previously 7-8 mm. 2. New metallic fiducial marker 1.5 cm anterior to and slightly inferior to the level of the nodule. 3. No evidence of regional adenopathy or metastatic disease. 4. Stable 7 mm ground-glass nodule laterally in the right upper lobe. 5. Aortic and coronary artery atherosclerosis. 6. Hepatic steatosis. 7. Nonobstructive right nephrolithiasis. Aortic Atherosclerosis (ICD10-I70.0) and Emphysema (ICD10-J43.9). Electronically Signed   By: Denman Fischer M.D.   On: 11/21/2023 04:40   MM 3D SCREENING MAMMOGRAM BILATERAL BREAST Result Date: 11/16/2023 CLINICAL DATA:  Screening. EXAM: DIGITAL SCREENING BILATERAL MAMMOGRAM WITH TOMOSYNTHESIS AND CAD TECHNIQUE: Bilateral screening digital craniocaudal and mediolateral oblique mammograms were obtained. Bilateral  screening digital breast tomosynthesis was performed. The images were evaluated with computer-aided detection. COMPARISON:  Previous exam(s). ACR Breast Density Category b: There are scattered areas of fibroglandular density. FINDINGS: There are no findings suspicious for malignancy. IMPRESSION: No mammographic evidence of malignancy. A result letter of this screening mammogram will be mailed directly to the patient. RECOMMENDATION: Screening mammogram in one year. (Code:[id number]) BI-RADS CATEGORY  1: Negative. Electronically Signed   By: Anna Barnes M.D.   On: 11/16/2023 16:03   CT Head Wo Contrast Result Date: 11/14/2023 CLINICAL DATA:  Mechanical fall with head trauma EXAM: CT HEAD WITHOUT CONTRAST CT CERVICAL SPINE WITHOUT CONTRAST TECHNIQUE: Multidetector CT imaging of the head and cervical spine was performed following the standard protocol without intravenous contrast. Multiplanar CT image reconstructions of the cervical spine were also generated. RADIATION DOSE REDUCTION: This exam was performed according to the departmental dose-optimization program which includes automated exposure control, adjustment of the mA and/or kV according to patient size and/or use of iterative reconstruction technique. COMPARISON:  02/21/2019 FINDINGS: CT HEAD FINDINGS Brain: There is no mass, hemorrhage or extra-axial collection. The size and configuration of the ventricles and extra-axial CSF spaces are normal. There is hypoattenuation of the white matter, most commonly indicating chronic small vessel disease. Vascular: No abnormal hyperdensity of the major intracranial arteries or dural venous sinuses. Mild intracranial atherosclerosis. Skull: The visualized skull base, calvarium and extracranial soft tissues are normal. Sinuses/Orbits: No fluid levels or advanced mucosal thickening of the visualized paranasal sinuses. No mastoid or middle ear effusion. Normal orbits. Other: None. CT CERVICAL SPINE FINDINGS Alignment: No  static subluxation. Facets are aligned. Occipital condyles are normally positioned. Skull base and vertebrae: No acute fracture. C4-6 ACDF without adverse features. Soft tissues and spinal canal: No prevertebral fluid or swelling. No visible canal hematoma. Disc levels: No advanced spinal canal or neural foraminal stenosis. Upper chest: No pneumothorax, pulmonary nodule or pleural effusion. Other: Normal visualized paraspinal cervical soft tissues. IMPRESSION: 1. No acute intracranial abnormality. 2. Chronic small vessel disease. 3. No acute fracture or static subluxation of the cervical spine. Electronically Signed   By: Juanetta Nordmann M.D.   On: 11/14/2023 12:33   CT Cervical Spine Wo Contrast Result Date: 11/14/2023 CLINICAL DATA:  Mechanical fall with head trauma EXAM: CT HEAD WITHOUT CONTRAST CT CERVICAL SPINE WITHOUT CONTRAST TECHNIQUE: Multidetector CT imaging of the head and cervical spine was performed following the standard protocol without intravenous contrast. Multiplanar CT image reconstructions of the cervical spine were also generated. RADIATION DOSE REDUCTION: This exam was performed according to the departmental dose-optimization program which includes automated exposure control, adjustment of the mA and/or kV according to patient size and/or use of iterative reconstruction technique. COMPARISON:  02/21/2019 FINDINGS: CT HEAD FINDINGS Brain: There  is no mass, hemorrhage or extra-axial collection. The size and configuration of the ventricles and extra-axial CSF spaces are normal. There is hypoattenuation of the white matter, most commonly indicating chronic small vessel disease. Vascular: No abnormal hyperdensity of the major intracranial arteries or dural venous sinuses. Mild intracranial atherosclerosis. Skull: The visualized skull base, calvarium and extracranial soft tissues are normal. Sinuses/Orbits: No fluid levels or advanced mucosal thickening of the visualized paranasal sinuses. No  mastoid or middle ear effusion. Normal orbits. Other: None. CT CERVICAL SPINE FINDINGS Alignment: No static subluxation. Facets are aligned. Occipital condyles are normally positioned. Skull base and vertebrae: No acute fracture. C4-6 ACDF without adverse features. Soft tissues and spinal canal: No prevertebral fluid or swelling. No visible canal hematoma. Disc levels: No advanced spinal canal or neural foraminal stenosis. Upper chest: No pneumothorax, pulmonary nodule or pleural effusion. Other: Normal visualized paraspinal cervical soft tissues. IMPRESSION: 1. No acute intracranial abnormality. 2. Chronic small vessel disease. 3. No acute fracture or static subluxation of the cervical spine. Electronically Signed   By: Juanetta Nordmann M.D.   On: 11/14/2023 12:33   DG Elbow Complete Left Result Date: 11/14/2023 CLINICAL DATA:  Pain after fall EXAM: LEFT ELBOW - COMPLETE 4 VIEW COMPARISON:  None Available. FINDINGS: There is no evidence of fracture, dislocation, or joint effusion. There is no evidence of arthropathy or other focal bone abnormality. Soft tissues are unremarkable. IMPRESSION: No acute osseous abnormality. Electronically Signed   By: Adrianna Horde M.D.   On: 11/14/2023 12:17     Past medical hx Past Medical History:  Diagnosis Date   Anemia    Anxiety    Arthritis    Cataract    Chronic headaches    pt states that they have improved - 08/06/23   Coronary artery disease    Depression    Diabetes mellitus    Diverticulosis of colon (without mention of hemorrhage) 2003, 2008   Colonoscopy   Eczema    Emphysema of lung (HCC)    GERD (gastroesophageal reflux disease)    Glaucoma    Hiatal hernia 2003   EGD    History of pneumonia 11/2022   Hyperlipidemia    Hypertension    PONV (postoperative nausea and vomiting)    Thyroid  nodule      Social History   Tobacco Use   Smoking status: Former    Current packs/day: 0.00    Average packs/day: 1.4 packs/day for 49.0 years (68.6  ttl pk-yrs)    Types: Cigarettes    Start date: 07/15/1966    Quit date: 07/16/2015    Years since quitting: 8.3    Passive exposure: Past   Smokeless tobacco: Never   Tobacco comments:    Has not vaped in 2 weeks 12/05/2023  Vaping Use   Vaping status: Some Days  Substance Use Topics   Alcohol use: Yes    Alcohol/week: 0.0 standard drinks of alcohol    Comment: once weekly; wine and beer   Drug use: No    Ms.Jagger reports that she quit smoking about 8 years ago. Her smoking use included cigarettes. She started smoking about 57 years ago. She has a 68.6 pack-year smoking history. She has been exposed to tobacco smoke. She has never used smokeless tobacco. She reports current alcohol use. She reports that she does not use drugs.  Tobacco Cessation: Counseling given: Not Answered Tobacco comments: Has not vaped in 2 weeks 12/05/2023 Quit tobacco smoking 2017 Has not vaped in 2  weeks  Past surgical hx, Family hx, Social hx all reviewed.  Current Outpatient Medications on File Prior to Visit  Medication Sig   amitriptyline  (ELAVIL ) 25 MG tablet Take 2 tablets (50 mg total) by mouth at bedtime.   aspirin  EC 81 MG tablet Take 81 mg by mouth daily.   BIOTIN PO Take 1 tablet by mouth daily.    Coenzyme Q10 (CO Q 10 PO) Take 1 capsule by mouth daily.   fenofibrate  160 MG tablet Take 1 tablet (160 mg total) by mouth daily.   Ferrous Sulfate (IRON SUPPLEMENT PO) Take 1 tablet by mouth daily. 65mg    fluticasone  (FLONASE ) 50 MCG/ACT nasal spray Place 2 sprays into both nostrils daily as needed for allergies.   ipratropium (ATROVENT ) 0.06 % nasal spray Place 2 sprays into both nostrils 3 (three) times daily as needed for rhinitis.   latanoprost  (XALATAN ) 0.005 % ophthalmic solution Place 1 drop into both eyes at bedtime.    lisinopril  (ZESTRIL ) 5 MG tablet Take 1 tablet (5 mg total) by mouth daily.   metFORMIN  (GLUCOPHAGE -XR) 500 MG 24 hr tablet TAKE 4 TABLETS BY MOUTH EVERY DAY    montelukast  (SINGULAIR ) 10 MG tablet Take 1 tablet (10 mg total) by mouth at bedtime.   Naproxen Sodium (ALEVE PO) Take 2 tablets by mouth daily as needed (pain).   NONFORMULARY OR COMPOUNDED ITEM Take 1 tablet by mouth at bedtime. Cbd /delta 9 1 po qhs   omeprazole  (PRILOSEC) 20 MG capsule TAKE 1 CAPSULE BY MOUTH DAILY   repaglinide  (PRANDIN ) 1 MG tablet Take 2 tablets (2 mg total) by mouth daily. Before evening meal.   rosuvastatin  (CRESTOR ) 40 MG tablet Take 1 tablet (40 mg total) by mouth at bedtime.   Semaglutide  (RYBELSUS ) 7 MG TABS Take 1 tablet (7 mg total) by mouth daily.   No current facility-administered medications on file prior to visit.     Allergies  Allergen Reactions   Codeine Nausea Only    Severe nausea   Augmentin  [Amoxicillin -Pot Clavulanate]     Severe nausea and vomitting    Review Of Systems:  Constitutional:   No  weight loss, night sweats,  Fevers, chills, fatigue, or  lassitude.  HEENT:   No headaches,  Difficulty swallowing,  Tooth/dental problems, or  Sore throat,                No sneezing, itching, ear ache, nasal congestion, post nasal drip,   CV:  No chest pain,  Orthopnea, PND, swelling in lower extremities, anasarca, dizziness, palpitations, syncope.   GI  No heartburn, indigestion, abdominal pain, nausea, vomiting, diarrhea, change in bowel habits, loss of appetite, bloody stools.   Resp: No shortness of breath with exertion or at rest.  No excess mucus, no productive cough,  No non-productive cough,  No coughing up of blood.  No change in color of mucus.  No wheezing.  No chest wall deformity  Skin: no rash or lesions.  GU: no dysuria, change in color of urine, no urgency or frequency.  No flank pain, no hematuria   MS:  No joint pain or swelling.  No decreased range of motion.  No back pain.  Psych:  No change in mood or affect. No depression or anxiety.  No memory loss.   Vital Signs BP 121/74 (BP Location: Left Arm, Patient Position:  Sitting, Cuff Size: Normal)   Pulse 73   Ht 5' 6 (1.676 m)   Wt 139 lb 6.4 oz (63.2  kg)   SpO2 99%   BMI 22.50 kg/m    Physical Exam:  General- No distress,  A&Ox3, pleasant and appropriate ENT: No sinus tenderness, TM clear, pale nasal mucosa, no oral exudate,no post nasal drip, no LAN Cardiac: S1, S2, regular rate and rhythm, no murmur Chest: No wheeze/ rales/ dullness; no accessory muscle use, no nasal flaring, no sternal retractions, slightly diminished per bases Abd.: Soft Non-tender, nondistended, bowel sounds positive,Body mass index is 22.5 kg/m.  Ext: No clubbing cyanosis, edema, no obvious deformities Neuro:  normal strength, moving all extremities x 4, alert and oriented x 3, appropriate Skin: No rashes, warm and dry, no obvious skin lesions Psych: normal mood and behavior, appropriately concerned about CT scan results.   Assessment/Plan Interval growth of right lower lobe macrolobulated nodule, previously 7.8 mm now 12 mm. Current everyday vapor Former smoker quit 2017 Plan The nodule we have been watching in the Right lower lobe has grown in the last 3 months. This will require further evaluation for treatment  I have referred you to both thoracic surgery to talk about getting it removed, as well as radiation oncology for treatment. You will get calls to schedule these appointments. Please let me know what you decide to do after speaking with both doctors. Let me know if you change your mind about a rescue inhaler. Call if you need us . Please contact office for sooner follow up if symptoms do not improve or worsen or seek emergency care    Patient was counseled for 3 minutes today regarding continued vaping. She was advised to quit vaping completely.   I spent 35 minutes dedicated to the care of this patient on the date of this encounter to include pre-visit review of records, face-to-face time with the patient discussing conditions above, post visit ordering of  testing, clinical documentation with the electronic health record, making appropriate referrals as documented, and communicating necessary information to the patient's healthcare team.   Raejean Bullock, NP 12/05/2023  8:58 AM

## 2023-12-05 NOTE — Progress Notes (Signed)
 Thoracic Location of Tumor / Histology: Right Lower Lobe Lung  Patient has been followed through the lung cancer screening program for abnormal imaging. She was referred January 2025 for consultation regarding an enlarging right lower lobe pulmonary nodule that was PET avid, and concerning for stage Ia primary bronchogenic carcinoma.  Patient underwent bronchoscopy with biopsies on 08/07/2023 to further evaluate the nodule of concern     Biopsies of Right Lower Lobe Lung 08/07/2023   Past/Anticipated interventions by pulmonary, if any: Alyssa Ford 12/05/2023 -The scan shows interval growth of the right lower lobe microlobulated nodule.  This is increased in size from 7 to 8 mm to 12 mm. There was notation of the metallic fiducial marker 1.5 cm anterior to and slightly inferior to the level of the nodule. -There was notation of a stable 7 [name] groundglass nodule In the right upper lobe.  - Patient is interested in having this nodule surgically removed.  I have referred her to thoracic surgery to be evaluated.  -I have also referred the patient to radiation oncology for consideration of SBRT without additional biopsy.    Past/Anticipated interventions by cardiothoracic surgery, if any:  Dr. Luna Salinas 12/13/2023   Past/Anticipated interventions by medical oncology, if any:    Tobacco/Marijuana/Snuff/ETOH use: Former Smoker quit 2017, quit vaping 2 weeks ago.  Signs/Symptoms Weight changes, if any: Stable, fluctuates 4-5 pounds at baseline. Respiratory complaints, if any: She reports some SOB when walking incline. Hemoptysis, if any: Denies cough. Pain issues, if any:  Denies chest pain, pressure, or tightness.  SAFETY ISSUES: Prior radiation? No Pacemaker/ICD?  No Possible current pregnancy? Postmenopausal Is the patient on methotrexate? No  Current Complaints / other details:

## 2023-12-05 NOTE — Patient Instructions (Addendum)
 It is good to see you today. The nodule we have been watching in the Right lower lobe has grown in 3 months. I have referred you to both thoracic surgery to talk about getting it removed, as well as radiation oncology. You will get calls to schedule these appointments. Please let me know what you decide to do after speaking with both doctors. Let me know if you change your mind about a rescue inhaler. Call if you need us . Please contact office for sooner follow up if symptoms do not improve or worsen or seek emergency care

## 2023-12-05 NOTE — Telephone Encounter (Signed)
 6/11 @ 9:34 am Left voicemail with patient and patient's spouse contact nunmber.

## 2023-12-06 ENCOUNTER — Encounter: Payer: Self-pay | Admitting: Radiation Oncology

## 2023-12-06 ENCOUNTER — Ambulatory Visit
Admission: RE | Admit: 2023-12-06 | Discharge: 2023-12-06 | Disposition: A | Discharge status: Home / Self Care | Source: Ambulatory Visit | Attending: Radiation Oncology | Admitting: Radiation Oncology

## 2023-12-06 VITALS — BP 143/63 | HR 81 | Temp 96.2°F | Resp 18 | Ht 66.0 in | Wt 140.5 lb

## 2023-12-06 DIAGNOSIS — I700 Atherosclerosis of aorta: Secondary | ICD-10-CM | POA: Diagnosis not present

## 2023-12-06 DIAGNOSIS — J439 Emphysema, unspecified: Secondary | ICD-10-CM | POA: Insufficient documentation

## 2023-12-06 DIAGNOSIS — Z803 Family history of malignant neoplasm of breast: Secondary | ICD-10-CM | POA: Diagnosis not present

## 2023-12-06 DIAGNOSIS — E119 Type 2 diabetes mellitus without complications: Secondary | ICD-10-CM | POA: Insufficient documentation

## 2023-12-06 DIAGNOSIS — Z7984 Long term (current) use of oral hypoglycemic drugs: Secondary | ICD-10-CM | POA: Insufficient documentation

## 2023-12-06 DIAGNOSIS — Z7982 Long term (current) use of aspirin: Secondary | ICD-10-CM | POA: Insufficient documentation

## 2023-12-06 DIAGNOSIS — Z79899 Other long term (current) drug therapy: Secondary | ICD-10-CM | POA: Insufficient documentation

## 2023-12-06 DIAGNOSIS — D649 Anemia, unspecified: Secondary | ICD-10-CM | POA: Insufficient documentation

## 2023-12-06 DIAGNOSIS — K219 Gastro-esophageal reflux disease without esophagitis: Secondary | ICD-10-CM | POA: Diagnosis not present

## 2023-12-06 DIAGNOSIS — N200 Calculus of kidney: Secondary | ICD-10-CM | POA: Insufficient documentation

## 2023-12-06 DIAGNOSIS — M129 Arthropathy, unspecified: Secondary | ICD-10-CM | POA: Diagnosis not present

## 2023-12-06 DIAGNOSIS — E785 Hyperlipidemia, unspecified: Secondary | ICD-10-CM | POA: Insufficient documentation

## 2023-12-06 DIAGNOSIS — C3431 Malignant neoplasm of lower lobe, right bronchus or lung: Secondary | ICD-10-CM | POA: Insufficient documentation

## 2023-12-06 DIAGNOSIS — J432 Centrilobular emphysema: Secondary | ICD-10-CM | POA: Insufficient documentation

## 2023-12-06 DIAGNOSIS — K760 Fatty (change of) liver, not elsewhere classified: Secondary | ICD-10-CM | POA: Diagnosis not present

## 2023-12-06 DIAGNOSIS — I2510 Atherosclerotic heart disease of native coronary artery without angina pectoris: Secondary | ICD-10-CM | POA: Insufficient documentation

## 2023-12-06 DIAGNOSIS — Z87891 Personal history of nicotine dependence: Secondary | ICD-10-CM | POA: Insufficient documentation

## 2023-12-06 DIAGNOSIS — K449 Diaphragmatic hernia without obstruction or gangrene: Secondary | ICD-10-CM | POA: Diagnosis not present

## 2023-12-06 NOTE — Progress Notes (Signed)
 Radiation Oncology         [phone] ________________________________  Name: GUNDA Ford        MRN: [id number]  Date of Service: 12/06/2023 DOB: 1950/11/15  [id number] Chase, Candida Chalk, DO  Byrum, Delora Ferry, MD     REFERRING PHYSICIAN: Denson Flake, MD   DIAGNOSIS: The encounter diagnosis was Primary malignant neoplasm of right lower lobe of lung (HCC).   HISTORY OF PRESENT ILLNESS: Alyssa Ford is a 73 y.o. female seen at the request of Dr. Baldwin Levee for a presumed lung cancer.  The patient was being followed through the lung cancer screening program and September 2024, this identified a Lung-RADS 4B lesion measuring approximately 6.7 mm in the right lower lobe and in retrospect had been seen on a prior CT of the chest in March 2024 when it measured 4.8 mm.  She was counseled on continuing to follow this and on 06/13/2023 had a repeat CT scan which again confirmed Lung-RADS 4B lesion in the right lower lobe measuring 8.3 mm.  She was counseled on pet imaging which was performed on 07/06/2023, uptake within the right lower lobe nodule had an max SUV of 2.8 while blood pool was 2.5.  No other evidence of hypermetabolic activity was appreciated and a bronchoscopy was attempted on 08/07/2023 and showed atypical cells favoring reactive bronchial cells within the fine-needle aspirate and scant cellularity in the brushing specimen.  She was followed with a repeat scan on 11/08/2023 which showed the lesion had increased to 12 mm.  She is been referred to discuss consideration of stereotactic body radiotherapy (SBRT) and is also scheduled to meet with Dr. Luna Salinas next week.    PREVIOUS RADIATION THERAPY: No   PAST MEDICAL HISTORY:  Past Medical History:  Diagnosis Date   Anemia    Anxiety    Arthritis    Cataract    Chronic headaches    pt states that they have improved - 08/06/23   Coronary artery disease    Depression    Diabetes mellitus    Diverticulosis of colon (without mention  of hemorrhage) 2003, 2008   Colonoscopy   Eczema    Emphysema of lung (HCC)    GERD (gastroesophageal reflux disease)    Glaucoma    Hiatal hernia 2003   EGD    History of pneumonia 11/2022   Hyperlipidemia    Hypertension    PONV (postoperative nausea and vomiting)    Thyroid  nodule        PAST SURGICAL HISTORY: Past Surgical History:  Procedure Laterality Date   BRONCHIAL BIOPSY  08/07/2023   Procedure: BRONCHIAL BIOPSIES;  Surgeon: Denson Flake, MD;  Location: [facility] ENDOSCOPY;  Service: Pulmonary;;   BRONCHIAL BRUSHINGS  08/07/2023   Procedure: BRONCHIAL BRUSHINGS;  Surgeon: Denson Flake, MD;  Location: [facility] ENDOSCOPY;  Service: Pulmonary;;   BRONCHIAL NEEDLE ASPIRATION BIOPSY  08/07/2023   Procedure: BRONCHIAL NEEDLE ASPIRATION BIOPSIES;  Surgeon: Denson Flake, MD;  Location: [facility] ENDOSCOPY;  Service: Pulmonary;;   CARPAL TUNNEL RELEASE  2006   CARPAL TUNNEL RELEASE  12/24/2007   CATARACT EXTRACTION, BILATERAL     [place]---  sept and oct 2018   CERVICAL FUSION  08/2006   CHOLECYSTECTOMY     COSMETIC SURGERY  December 09 2018   EYE SURGERY  catracts   FASCIECTOMY Left 08/01/2018   Procedure: FASCIECTOMY LEFT RING;  Surgeon: Lyanne Sample, MD;  Location: [facility];  Service: Orthopedics;  Laterality: Left;  UPPER ARM   FIDUCIAL MARKER PLACEMENT  08/07/2023   Procedure: FIDUCIAL MARKER PLACEMENT;  Surgeon: Denson Flake, MD;  Location: [facility] ENDOSCOPY;  Service: Pulmonary;;   SPINE SURGERY  neck dic replaced   TRIGGER FINGER RELEASE Left 08/01/2018   Procedure: RELEASE TRIGGER FINGER/A-1 PULLEY LEFTRING;  Surgeon: Lyanne Sample, MD;  Location: [facility];  Service: Orthopedics;  Laterality: Left;   TUBAL LIGATION       FAMILY HISTORY:  Family History  Problem Relation Age of Onset   Angioedema Mother    Dementia Mother    Breast cancer Mother 84   Kidney failure Mother 66       died from kidney failure from abx given to her for pneumonia    Hyperlipidemia Mother    Hypertension Mother    Diabetes Mother    Cancer Mother 67       breast   Parkinsonism Father    Diabetes Father 86   Hypertension Sister    Diabetes Brother    Hypertension Brother    Heart attack Brother    Heart disease Cousin 69       mi   Colon cancer Neg Hx    Esophageal cancer Neg Hx    Stomach cancer Neg Hx      SOCIAL HISTORY:  reports that she quit smoking about 8 years ago. Her smoking use included cigarettes. She started smoking about 57 years ago. She has a 68.6 pack-year smoking history. She has been exposed to tobacco smoke. She has never used smokeless tobacco. She reports current alcohol use. She reports that she does not use drugs.  The patient is married and resides in [place].  She and her husband care for their great-grandchildren 2 days a week.   ALLERGIES: Codeine and Augmentin  [amoxicillin -pot clavulanate]   MEDICATIONS:  Current Outpatient Medications  Medication Sig Dispense Refill   amitriptyline  (ELAVIL ) 25 MG tablet Take 2 tablets (50 mg total) by mouth at bedtime.     aspirin  EC 81 MG tablet Take 81 mg by mouth daily.     BIOTIN PO Take 1 tablet by mouth daily.      Coenzyme Q10 (CO Q 10 PO) Take 1 capsule by mouth daily.     fenofibrate  160 MG tablet Take 1 tablet (160 mg total) by mouth daily. 90 tablet 1   Ferrous Sulfate (IRON SUPPLEMENT PO) Take 1 tablet by mouth daily. 65mg      fluticasone  (FLONASE ) 50 MCG/ACT nasal spray Place 2 sprays into both nostrils daily as needed for allergies.     ipratropium (ATROVENT ) 0.06 % nasal spray Place 2 sprays into both nostrils 3 (three) times daily as needed for rhinitis. 15 mL 5   latanoprost  (XALATAN ) 0.005 % ophthalmic solution Place 1 drop into both eyes at bedtime.      lisinopril  (ZESTRIL ) 5 MG tablet Take 1 tablet (5 mg total) by mouth daily. 90 tablet 1   metFORMIN  (GLUCOPHAGE -XR) 500 MG 24 hr tablet TAKE 4 TABLETS BY MOUTH EVERY DAY 360 tablet 1   montelukast   (SINGULAIR ) 10 MG tablet Take 1 tablet (10 mg total) by mouth at bedtime. 90 tablet 3   Naproxen Sodium (ALEVE PO) Take 2 tablets by mouth daily as needed (pain).     NONFORMULARY OR COMPOUNDED ITEM Take 1 tablet by mouth at bedtime. Cbd /delta 9 1 po qhs     omeprazole  (PRILOSEC) 20 MG capsule TAKE 1 CAPSULE BY MOUTH DAILY 100 capsule 2  repaglinide  (PRANDIN ) 1 MG tablet Take 2 tablets (2 mg total) by mouth daily. Before evening meal. 180 tablet 0   rosuvastatin  (CRESTOR ) 40 MG tablet Take 1 tablet (40 mg total) by mouth at bedtime. 90 tablet 1   Semaglutide  (RYBELSUS ) 7 MG TABS Take 1 tablet (7 mg total) by mouth daily.     No current facility-administered medications for this encounter.     REVIEW OF SYSTEMS: On review of systems, the patient reports that she is doing well without any overt symptoms at rest, she states that her weight overall has been stable without any unintended changes, she does walk regularly with her husband outside and a street in their neighborhood has a significant incline and on occasion she can experience shortness of breath when she is walking up that incline.  Otherwise she is not experiencing chest pain, cough, or shortness of breath with less exertion.  No other complaints are verbalized.  PHYSICAL EXAM:  Wt Readings from Last 3 Encounters:  12/06/23 140 lb 8 oz (63.7 kg)  12/05/23 139 lb 6.4 oz (63.2 kg)  11/23/23 142 lb (64.4 kg)   Temp Readings from Last 3 Encounters:  12/06/23 (!) 96.2 F (35.7 C) (Temporal)  11/23/23 (!) 97.5 F (36.4 C) (Oral)  11/14/23 98.5 F (36.9 C)   BP Readings from Last 3 Encounters:  12/06/23 (!) 143/63  12/05/23 121/74  11/23/23 (!) 148/100   Pulse Readings from Last 3 Encounters:  12/06/23 81  12/05/23 73  11/23/23 72   Pain Assessment Pain Score: 0-No pain/10  In general this is a well appearing Caucasian female in no acute distress.  She's alert and oriented x4 and appropriate throughout the examination.  Cardiopulmonary assessment is negative for acute distress and she exhibits normal effort.     ECOG = 1  0 - Asymptomatic (Fully active, able to carry on all predisease activities without restriction)  1 - Symptomatic but completely ambulatory (Restricted in physically strenuous activity but ambulatory and able to carry out work of a light or sedentary nature. For example, light housework, office work)  2 - Symptomatic, <50% in bed during the day (Ambulatory and capable of all self care but unable to carry out any work activities. Up and about more than 50% of waking hours)  3 - Symptomatic, >50% in bed, but not bedbound (Capable of only limited self-care, confined to bed or chair 50% or more of waking hours)  4 - Bedbound (Completely disabled. Cannot carry on any self-care. Totally confined to bed or chair)  5 - Death   Aurea Blossom MM, Creech RH, Tormey DC, et al. [phone]). Toxicity and response criteria of the [facility]. Am. Hillard Lowes. Oncol. 5 (6): 649-55    LABORATORY DATA:  Lab Results  Component Value Date   WBC 6.6 11/23/2023   HGB 11.9 (L) 11/23/2023   HCT 36.0 11/23/2023   MCV 84.0 11/23/2023   PLT 260.0 11/23/2023   Lab Results  Component Value Date   NA 140 11/23/2023   K 3.8 11/23/2023   CL 103 11/23/2023   CO2 27 11/23/2023   Lab Results  Component Value Date   ALT 14 11/23/2023   AST 16 11/23/2023   ALKPHOS 44 11/23/2023   BILITOT 0.3 11/23/2023      RADIOGRAPHY: CT CHEST WO CONTRAST Result Date: 11/21/2023 CLINICAL DATA:  Hypermetabolic right lower lobe nodule. PET-CT findings consistent with stage I A primary bronchogenic carcinoma. EXAM: CT CHEST WITHOUT CONTRAST TECHNIQUE: Multidetector  CT imaging of the chest was performed following the standard protocol without IV contrast. RADIATION DOSE REDUCTION: This exam was performed according to the departmental dose-optimization program which includes automated exposure control, adjustment of  the mA and/or kV according to patient size and/or use of iterative reconstruction technique. COMPARISON:  Post bronchoscopic biopsy portable chest 08/07/2023, PET-CT 07/06/2023, and chest CTs without contrast 06/13/2023 and 03/14/2023. FINDINGS: Cardiovascular: The cardiac size is normal. There is no pericardial effusion. There are three-vessel coronary calcifications, atherosclerosis in the aorta and great vessels and no aortic aneurysm. The pulmonary arteries and veins are normal caliber. Mediastinum/Nodes: 2.2 cm heterogeneous nodule inferiorly in the left lobe of the thyroid  gland, was evaluated on prior imaging most recently thyroid  ultrasound 07/25/2023 with no recommendation for follow-up or biopsy. Axillary spaces are clear. A borderline prominent precarinal lymph node, 8 mm in short axis, is again noted but there is no adenopathy by short axis size criteria. Evaluation of the hila is limited without contrast but there is no new hilar contour abnormality. Lungs/Pleura: Right lower lobe microlobulated nodular neoplasm today measures 12 mm, on the last 2 CTS was 7-8 mm in size the consistent with interval growth. 1.5 cm anterior to and slightly inferior to the level of the nodule, there is new demonstration of a metallic fiducial marker placed in the lung tissue. There is minimal centrilobular emphysematous change in the upper lobes, mild biapical reticulated scarring. There is a faint 7 mm ground-glass nodule laterally in the right upper lobe on 5:46, stable. There is no consolidation, effusion or further nodules. There is mild bronchial thickening in the lower lobes. Upper Abdomen: Prior cholecystectomy. 4 mm nonobstructive caliceal stone superior pole right kidney. Abdominal aortic atherosclerosis. No acute upper abdominal findings. Mild hepatic steatosis. No adrenal mass. Musculoskeletal: No regional bone metastasis is seen. IMPRESSION: 1. Interval growth of the right lower lobe microlobulated nodular  neoplasm, now 12 mm, previously 7-8 mm. 2. New metallic fiducial marker 1.5 cm anterior to and slightly inferior to the level of the nodule. 3. No evidence of regional adenopathy or metastatic disease. 4. Stable 7 mm ground-glass nodule laterally in the right upper lobe. 5. Aortic and coronary artery atherosclerosis. 6. Hepatic steatosis. 7. Nonobstructive right nephrolithiasis. Aortic Atherosclerosis (ICD10-I70.0) and Emphysema (ICD10-J43.9). Electronically Signed   By: Denman Fischer M.D.   On: 11/21/2023 04:40   MM 3D SCREENING MAMMOGRAM BILATERAL BREAST Result Date: 11/16/2023 CLINICAL DATA:  Screening. EXAM: DIGITAL SCREENING BILATERAL MAMMOGRAM WITH TOMOSYNTHESIS AND CAD TECHNIQUE: Bilateral screening digital craniocaudal and mediolateral oblique mammograms were obtained. Bilateral screening digital breast tomosynthesis was performed. The images were evaluated with computer-aided detection. COMPARISON:  Previous exam(s). ACR Breast Density Category b: There are scattered areas of fibroglandular density. FINDINGS: There are no findings suspicious for malignancy. IMPRESSION: No mammographic evidence of malignancy. A result letter of this screening mammogram will be mailed directly to the patient. RECOMMENDATION: Screening mammogram in one year. (Code:[id number]) BI-RADS CATEGORY  1: Negative. Electronically Signed   By: Anna Barnes M.D.   On: 11/16/2023 16:03   CT Head Wo Contrast Result Date: 11/14/2023 CLINICAL DATA:  Mechanical fall with head trauma EXAM: CT HEAD WITHOUT CONTRAST CT CERVICAL SPINE WITHOUT CONTRAST TECHNIQUE: Multidetector CT imaging of the head and cervical spine was performed following the standard protocol without intravenous contrast. Multiplanar CT image reconstructions of the cervical spine were also generated. RADIATION DOSE REDUCTION: This exam was performed according to the departmental dose-optimization program which includes automated exposure control, adjustment of  the mA  and/or kV according to patient size and/or use of iterative reconstruction technique. COMPARISON:  02/21/2019 FINDINGS: CT HEAD FINDINGS Brain: There is no mass, hemorrhage or extra-axial collection. The size and configuration of the ventricles and extra-axial CSF spaces are normal. There is hypoattenuation of the white matter, most commonly indicating chronic small vessel disease. Vascular: No abnormal hyperdensity of the major intracranial arteries or dural venous sinuses. Mild intracranial atherosclerosis. Skull: The visualized skull base, calvarium and extracranial soft tissues are normal. Sinuses/Orbits: No fluid levels or advanced mucosal thickening of the visualized paranasal sinuses. No mastoid or middle ear effusion. Normal orbits. Other: None. CT CERVICAL SPINE FINDINGS Alignment: No static subluxation. Facets are aligned. Occipital condyles are normally positioned. Skull base and vertebrae: No acute fracture. C4-6 ACDF without adverse features. Soft tissues and spinal canal: No prevertebral fluid or swelling. No visible canal hematoma. Disc levels: No advanced spinal canal or neural foraminal stenosis. Upper chest: No pneumothorax, pulmonary nodule or pleural effusion. Other: Normal visualized paraspinal cervical soft tissues. IMPRESSION: 1. No acute intracranial abnormality. 2. Chronic small vessel disease. 3. No acute fracture or static subluxation of the cervical spine. Electronically Signed   By: Juanetta Nordmann M.D.   On: 11/14/2023 12:33   CT Cervical Spine Wo Contrast Result Date: 11/14/2023 CLINICAL DATA:  Mechanical fall with head trauma EXAM: CT HEAD WITHOUT CONTRAST CT CERVICAL SPINE WITHOUT CONTRAST TECHNIQUE: Multidetector CT imaging of the head and cervical spine was performed following the standard protocol without intravenous contrast. Multiplanar CT image reconstructions of the cervical spine were also generated. RADIATION DOSE REDUCTION: This exam was performed according to the  departmental dose-optimization program which includes automated exposure control, adjustment of the mA and/or kV according to patient size and/or use of iterative reconstruction technique. COMPARISON:  02/21/2019 FINDINGS: CT HEAD FINDINGS Brain: There is no mass, hemorrhage or extra-axial collection. The size and configuration of the ventricles and extra-axial CSF spaces are normal. There is hypoattenuation of the white matter, most commonly indicating chronic small vessel disease. Vascular: No abnormal hyperdensity of the major intracranial arteries or dural venous sinuses. Mild intracranial atherosclerosis. Skull: The visualized skull base, calvarium and extracranial soft tissues are normal. Sinuses/Orbits: No fluid levels or advanced mucosal thickening of the visualized paranasal sinuses. No mastoid or middle ear effusion. Normal orbits. Other: None. CT CERVICAL SPINE FINDINGS Alignment: No static subluxation. Facets are aligned. Occipital condyles are normally positioned. Skull base and vertebrae: No acute fracture. C4-6 ACDF without adverse features. Soft tissues and spinal canal: No prevertebral fluid or swelling. No visible canal hematoma. Disc levels: No advanced spinal canal or neural foraminal stenosis. Upper chest: No pneumothorax, pulmonary nodule or pleural effusion. Other: Normal visualized paraspinal cervical soft tissues. IMPRESSION: 1. No acute intracranial abnormality. 2. Chronic small vessel disease. 3. No acute fracture or static subluxation of the cervical spine. Electronically Signed   By: Juanetta Nordmann M.D.   On: 11/14/2023 12:33   DG Elbow Complete Left Result Date: 11/14/2023 CLINICAL DATA:  Pain after fall EXAM: LEFT ELBOW - COMPLETE 4 VIEW COMPARISON:  None Available. FINDINGS: There is no evidence of fracture, dislocation, or joint effusion. There is no evidence of arthropathy or other focal bone abnormality. Soft tissues are unremarkable. IMPRESSION: No acute osseous abnormality.  Electronically Signed   By: Adrianna Horde M.D.   On: 11/14/2023 12:17       IMPRESSION/PLAN: 1. Putative Stage IA2, cT1bN0M0, NSCLC, of the RLL. Dr. Jeryl Moris discusses the pathology findings and reviews  the nature of lung nodules and we reviewed the imaging and cytologic work up to date. While the lesion continues to change and remains suspicious for malignancy, her cytology was not definitive. We talked about clinically describing this as most likely malignant given the persistence and hypermetabolic change and growth over time. The options would be to consider another bronchoscopy to try to definitively make the diagnosis, versus proceed with surgery to remove that area of the lung, to treat with radiotherapy, or to reimage with another CT scan in 3 months time. Dr. Jeryl Moris reviews that the standard of care is for surgical resection. However for patients who are not medical candidates to undergo surgery, or who choose to forgo surgery, stereotactic body radiotherapy (SBRT) is an appropriate alternative. We discussed the risks, benefits, short, and long term effects of radiotherapy, as well as the curative intent, and the patient will meet with Dr. Luna Salinas next week to hear about surgery prior to making a decision. Dr. Jeryl Moris discusses the delivery and logistics of radiotherapy and anticipates a course of 3-5 fractions of radiotherapy.  We will follow up with her by phone to discuss how she'd like to move forward.    In a visit lasting 60 minutes, greater than 50% of the time was spent face to face discussing the patient's condition, in preparation for the discussion, and coordinating the patient's care.   The above documentation reflects my direct findings during this shared patient visit. Please see the separate note by Dr. Jeryl Moris on this date for the remainder of the patient's plan of care.    Shelvia Dick, [facility]   **Disclaimer: This note was dictated with voice recognition software. Similar  sounding words can inadvertently be transcribed and this note may contain transcription errors which may not have been corrected upon publication of note.**

## 2023-12-11 ENCOUNTER — Ambulatory Visit
Attending: Thoracic Surgery (Cardiothoracic Vascular Surgery) | Admitting: Thoracic Surgery (Cardiothoracic Vascular Surgery)

## 2023-12-11 ENCOUNTER — Encounter: Payer: Self-pay | Admitting: Thoracic Surgery (Cardiothoracic Vascular Surgery)

## 2023-12-11 VITALS — BP 145/76 | HR 67 | Resp 20 | Ht 66.0 in | Wt 140.0 lb

## 2023-12-11 DIAGNOSIS — R911 Solitary pulmonary nodule: Secondary | ICD-10-CM

## 2023-12-11 NOTE — Progress Notes (Signed)
 PCP is Crecencio Dodge, Candida Chalk, DO Referring Provider is Raejean Bullock, NP  Chief Complaint  Patient presents with   Lung Lesion    New patient consultation, chest CT 5/15    HPI: Alyssa Ford is sent for consultation regarding a right lower lobe lung nodule.  Alyssa Ford is a 73 year old former smoker with a past medical history significant for emphysema, hypertension, hyperlipidemia, coronary calcification on CT, type 2 diabetes, anemia, anxiety, depression, glaucoma, anemia, arthritis, thyroid  nodule, and a right lower lobe lung nodule.  She smoked about a pack and a half of cigarettes before quitting in 2017 (70-pack-year history).  Found to have a right lower lobe lung nodule on a low-dose CT for lung cancer screening in 2022.  Increased in size on the CT in 2023.  A follow-up CT 3 months later showed it had decreased in size, but it had increased again in September 2024.  In December 2024 it measured 8 mm.  She had a PET/CT in January which showed the nodule was hypermetabolic with an SUV of 2.8.  She underwent navigational bronchoscopy in February.  There were atypical cells on 1 sample.  She then had a follow-up CT in May which showed the nodule increased in size to 12 mm.  She denies any chest pain, pressure, tightness, shortness of breath, change in appetite, weight loss.  She has stable arthritis symptoms.  She can walk a mile and a half without stopping at a normal pace.  Zubrod Score: At the time of surgery this patient's most appropriate activity status/level should be described as: [x]     0    Normal activity, no symptoms []     1    Restricted in physical strenuous activity but ambulatory, able to do out light work []     2    Ambulatory and capable of self care, unable to do work activities, up and about >50 % of waking hours                              []     3    Only limited self care, in bed greater than 50% of waking hours []     4    Completely disabled, no self care,  confined to bed or chair []     5    Moribund   Past Medical History:  Diagnosis Date   Anemia    Anxiety    Arthritis    Cataract    Chronic headaches    pt states that they have improved - 08/06/23   Coronary artery disease    Depression    Diabetes mellitus    Diverticulosis of colon (without mention of hemorrhage) 2003, 2008   Colonoscopy   Eczema    Emphysema of lung (HCC)    GERD (gastroesophageal reflux disease)    Glaucoma    Hiatal hernia 2003   EGD    History of pneumonia 11/2022   Hyperlipidemia    Hypertension    PONV (postoperative nausea and vomiting)    Thyroid  nodule     Past Surgical History:  Procedure Laterality Date   BRONCHIAL BIOPSY  08/07/2023   Procedure: BRONCHIAL BIOPSIES;  Surgeon: Denson Flake, MD;  Location: [facility] END[facility]COPY;  Service: Pulmonary;;   BRONCHIAL BRUSHINGS  08/07/2023   Procedure: BRONCHIAL BRUSHINGS;  Surgeon: Denson Flake, MD;  Location: [facility] ENDOSCOPY;  Service: Pulmonary;;   BRONCHIAL NEEDLE ASPIRATION BIOPSY  08/07/2023   Procedure: BRONCHIAL NEEDLE ASPIRATION BIOPSIES;  Surgeon: Denson Flake, MD;  Location: [facility] ENDOSCOPY;  Service: Pulmonary;;   CARPAL TUNNEL RELEASE  2006   CARPAL TUNNEL RELEASE  12/24/2007   CATARACT EXTRACTION, BILATERAL     [place]---  sept and oct 2018   CERVICAL FUSION  08/2006   CHOLECYSTECTOMY     COSMETIC SURGERY  December 09 2018   EYE SURGERY  catracts   FASCIECTOMY Left 08/01/2018   Procedure: FASCIECTOMY LEFT RING;  Surgeon: Lyanne Sample, MD;  Location: [facility];  Service: Orthopedics;  Laterality: Left;  UPPER ARM   FIDUCIAL MARKER PLACEMENT  08/07/2023   Procedure: FIDUCIAL MARKER PLACEMENT;  Surgeon: Denson Flake, MD;  Location: [facility] ENDOSCOPY;  Service: Pulmonary;;   SPINE SURGERY  neck dic replaced   TRIGGER FINGER RELEASE Left 08/01/2018   Procedure: RELEASE TRIGGER FINGER/A-1 PULLEY LEFTRING;  Surgeon: Lyanne Sample, MD;  Location: [facility];  Service:  Orthopedics;  Laterality: Left;   TUBAL LIGATION      Family History  Problem Relation Age of Onset   Angioedema Mother    Dementia Mother    Breast cancer Mother 73   Kidney failure Mother 26       died from kidney failure from abx given to her for pneumonia   Hyperlipidemia Mother    Hypertension Mother    Diabetes Mother    Cancer Mother 60       breast   Parkinsonism Father    Diabetes Father 1   Hypertension Sister    Diabetes Brother    Hypertension Brother    Heart attack Brother    Heart disease Cousin 75       mi   Colon cancer Neg Hx    Esophageal cancer Neg Hx    Stomach cancer Neg Hx     Social History Social History   Tobacco Use   Smoking status: Former    Current packs/day: 0.00    Average packs/day: 1.4 packs/day for 49.0 years (68.6 ttl pk-yrs)    Types: Cigarettes    Start date: 07/15/1966    Quit date: 07/16/2015    Years since quitting: 8.4    Passive exposure: Past   Smokeless tobacco: Never   Tobacco comments:    Has not vaped in 2 weeks 12/05/2023  Vaping Use   Vaping status: Some Days  Substance Use Topics   Alcohol use: Yes    Alcohol/week: 0.0 standard drinks of alcohol    Comment: once weekly; wine and beer   Drug use: No    Current Outpatient Medications  Medication Sig Dispense Refill   amitriptyline  (ELAVIL ) 25 MG tablet Take 2 tablets (50 mg total) by mouth at bedtime.     aspirin  EC 81 MG tablet Take 81 mg by mouth daily.     BIOTIN PO Take 1 tablet by mouth daily.      Coenzyme Q10 (CO Q 10 PO) Take 1 capsule by mouth daily.     fenofibrate  160 MG tablet Take 1 tablet (160 mg total) by mouth daily. 90 tablet 1   Ferrous Sulfate (IRON SUPPLEMENT PO) Take 1 tablet by mouth daily. 65mg      fluticasone  (FLONASE ) 50 MCG/ACT nasal spray Place 2 sprays into both nostrils daily as needed for allergies.     ipratropium (ATROVENT ) 0.06 % nasal spray Place 2 sprays into both nostrils 3 (three) times daily as needed for rhinitis. 15 mL  5   latanoprost  (XALATAN ) 0.005 % ophthalmic solution Place 1 drop into both eyes at bedtime.      lisinopril  (ZESTRIL ) 5 MG tablet Take 1 tablet (5 mg total) by mouth daily. 90 tablet 1   metFORMIN  (GLUCOPHAGE -XR) 500 MG 24 hr tablet TAKE 4 TABLETS BY MOUTH EVERY DAY 360 tablet 1   montelukast  (SINGULAIR ) 10 MG tablet Take 1 tablet (10 mg total) by mouth at bedtime. 90 tablet 3   Naproxen Sodium (ALEVE PO) Take 2 tablets by mouth daily as needed (pain).     NONFORMULARY OR COMPOUNDED ITEM Take 1 tablet by mouth at bedtime. Cbd /delta 9 1 po qhs     omeprazole  (PRILOSEC) 20 MG capsule TAKE 1 CAPSULE BY MOUTH DAILY 100 capsule 2   repaglinide  (PRANDIN ) 1 MG tablet Take 2 tablets (2 mg total) by mouth daily. Before evening meal. 180 tablet 0   rosuvastatin  (CRESTOR ) 40 MG tablet Take 1 tablet (40 mg total) by mouth at bedtime. 90 tablet 1   Semaglutide  (RYBELSUS ) 7 MG TABS Take 1 tablet (7 mg total) by mouth daily.     No current facility-administered medications for this visit.    Allergies  Allergen Reactions   Codeine Nausea Only    Severe nausea   Augmentin  [Amoxicillin -Pot Clavulanate]     Severe nausea and vomitting    Review of Systems  Constitutional:  Negative for activity change, fatigue and unexpected weight change.  HENT:  Negative for trouble swallowing and voice change.   Eyes:  Negative for visual disturbance.  Respiratory:  Negative for cough, shortness of breath and wheezing.   Cardiovascular:  Negative for chest pain and leg swelling.  Genitourinary:  Negative for difficulty urinating and dysuria.  Musculoskeletal:  Positive for arthralgias and joint swelling.  Neurological:  Negative for syncope and weakness.  Psychiatric/Behavioral:  The patient is nervous/anxious.   All other systems reviewed and are negative.   BP (!) 145/76   Pulse 67   Resp 20   Ht 5' 6 (1.676 m)   Wt 140 lb (63.5 kg)   SpO2 99% Comment: RA  BMI 22.60 kg/m  Physical Exam Vitals  reviewed.  Constitutional:      General: She is not in acute distress.    Appearance: Normal appearance.  HENT:     Head: Normocephalic and atraumatic.   Eyes:     Extraocular Movements: Extraocular movements intact.    Cardiovascular:     Rate and Rhythm: Normal rate and regular rhythm.     Heart sounds: Normal heart sounds. No murmur heard.    No friction rub. No gallop.  Pulmonary:     Effort: Pulmonary effort is normal. No respiratory distress.     Breath sounds: Normal breath sounds. No wheezing or rales.  Abdominal:     General: There is no distension.     Palpations: Abdomen is soft.   Musculoskeletal:     Cervical back: Neck supple.  Lymphadenopathy:     Cervical: No cervical adenopathy.   Skin:    General: Skin is warm and dry.   Neurological:     General: No focal deficit present.     Mental Status: She is alert and oriented to person, place, and time.     Cranial Nerves: No cranial nerve deficit.     Motor: No weakness.    Diagnostic Tests: CT CHEST WITHOUT CONTRAST   TECHNIQUE: Multidetector CT imaging of the chest was performed following the standard protocol  without IV contrast.   RADIATION DOSE REDUCTION: This exam was performed according to the departmental dose-optimization program which includes automated exposure control, adjustment of the mA and/or kV according to patient size and/or use of iterative reconstruction technique.   COMPARISON:  Post bronchoscopic biopsy portable chest 08/07/2023, PET-CT 07/06/2023, and chest CTs without contrast 06/13/2023 and 03/14/2023.   FINDINGS: Cardiovascular: The cardiac size is normal. There is no pericardial effusion. There are three-vessel coronary calcifications, atherosclerosis in the aorta and great vessels and no aortic aneurysm. The pulmonary arteries and veins are normal caliber.   Mediastinum/Nodes: 2.2 cm heterogeneous nodule inferiorly in the left lobe of the thyroid  gland, was evaluated  on prior imaging most recently thyroid  ultrasound 07/25/2023 with no recommendation for follow-up or biopsy.   Axillary spaces are clear. A borderline prominent precarinal lymph node, 8 mm in short axis, is again noted but there is no adenopathy by short axis size criteria. Evaluation of the hila is limited without contrast but there is no new hilar contour abnormality.   Lungs/Pleura: Right lower lobe microlobulated nodular neoplasm today measures 12 mm, on the last 2 CTS was 7-8 mm in size the consistent with interval growth.   1.5 cm anterior to and slightly inferior to the level of the nodule, there is new demonstration of a metallic fiducial marker placed in the lung tissue.   There is minimal centrilobular emphysematous change in the upper lobes, mild biapical reticulated scarring.   There is a faint 7 mm ground-glass nodule laterally in the right upper lobe on 5:46, stable.   There is no consolidation, effusion or further nodules. There is mild bronchial thickening in the lower lobes.   Upper Abdomen: Prior cholecystectomy. 4 mm nonobstructive caliceal stone superior pole right kidney. Abdominal aortic atherosclerosis.   No acute upper abdominal findings. Mild hepatic steatosis. No adrenal mass.   Musculoskeletal: No regional bone metastasis is seen.   IMPRESSION: 1. Interval growth of the right lower lobe microlobulated nodular neoplasm, now 12 mm, previously 7-8 mm. 2. New metallic fiducial marker 1.5 cm anterior to and slightly inferior to the level of the nodule. 3. No evidence of regional adenopathy or metastatic disease. 4. Stable 7 mm ground-glass nodule laterally in the right upper lobe. 5. Aortic and coronary artery atherosclerosis. 6. Hepatic steatosis. 7. Nonobstructive right nephrolithiasis.   Aortic Atherosclerosis (ICD10-I70.0) and Emphysema (ICD10-J43.9).     Electronically Signed   By: Denman Fischer M.D.   On: 11/21/2023 04:40 I personally  reviewed the CT images.  12 mm right lower lobe nodule increased in size from previous exam.  Aortic and coronary atherosclerosis.  Impression: Alyssa Ford is a 73 year old former smoker with a past medical history significant for emphysema, hypertension, hyperlipidemia, coronary calcification on CT, type 2 diabetes, anemia, anxiety, depression, glaucoma, anemia, arthritis,  thyroid  nodule, and a right lower lobe lung nodule.  Right lower lobe lung nodule-previously underwent navigational bronchoscopy which showed atypical cells.  Given the activity on PET when the nodule was smaller and the increase in size this most likely is a new primary bronchogenic carcinoma.  Infectious and inflammatory nodules acutely granulomatous disease are also in the differential diagnosis.  However, I think this has to be considered a new primary bronchogenic carcinoma unless it can be proven otherwise.  We discussed potential options including bronchoscopic biopsy, surgical biopsy, surgical resection for definitive treatment and radiation therapy.  We discussed the relative advantages and disadvantages.  My recommendation was that we proceed with robotic  assisted right VATS for wedge resection and then would proceed with right lower lobectomy if the intraoperative frozen section was positive.  I informed Alyssa Ford and her husband of the general nature of the procedure including the need for general anesthesia, the incisions to be used, the use of the surgical robot, the use of drains to postoperatively, the expected hospital stay, and the overall recovery.  I informed them of the indications, risks, benefits, and alternatives.  She understands the risks include, but are not limited to death, MI, DVT, PE, bleeding, possible need for transfusion, infection, prolonged air leak, cardiac arrhythmias, pain issues, as well as possibility of other unstable complications.  She is also seeing radiation oncology.  She is  leaning towards having stereotactic radiation.  Although reasonable I do not think that is her best option.  She and her husband will talk things over and let us  know how she would like to proceed.  Plan: Patient will call if she wants to pursue surgical resection.  Zelphia Higashi, MD [facility] [phone]

## 2023-12-13 ENCOUNTER — Encounter: Admitting: Thoracic Surgery (Cardiothoracic Vascular Surgery)

## 2023-12-18 ENCOUNTER — Telehealth: Payer: Self-pay | Admitting: Radiation Oncology

## 2023-12-18 NOTE — Telephone Encounter (Signed)
 The patient called to let us  know she would like to proceed with SBRT treatment. I'll reach out to our simulation department to coordinate treatment planning.

## 2023-12-23 DIAGNOSIS — Z87891 Personal history of nicotine dependence: Secondary | ICD-10-CM | POA: Diagnosis not present

## 2023-12-23 DIAGNOSIS — C3431 Malignant neoplasm of lower lobe, right bronchus or lung: Secondary | ICD-10-CM | POA: Diagnosis not present

## 2023-12-26 ENCOUNTER — Ambulatory Visit
Admission: RE | Admit: 2023-12-26 | Discharge: 2023-12-26 | Disposition: A | Discharge status: Home / Self Care | Source: Ambulatory Visit | Attending: Radiation Oncology | Admitting: Radiation Oncology

## 2023-12-26 DIAGNOSIS — Z510 Encounter for antineoplastic radiation therapy: Secondary | ICD-10-CM | POA: Insufficient documentation

## 2023-12-26 DIAGNOSIS — Z87891 Personal history of nicotine dependence: Secondary | ICD-10-CM | POA: Insufficient documentation

## 2023-12-26 DIAGNOSIS — C3431 Malignant neoplasm of lower lobe, right bronchus or lung: Secondary | ICD-10-CM | POA: Diagnosis not present

## 2024-01-01 ENCOUNTER — Ambulatory Visit: Admitting: Acute Care

## 2024-01-03 DIAGNOSIS — C3431 Malignant neoplasm of lower lobe, right bronchus or lung: Secondary | ICD-10-CM | POA: Diagnosis not present

## 2024-01-03 DIAGNOSIS — Z87891 Personal history of nicotine dependence: Secondary | ICD-10-CM | POA: Diagnosis not present

## 2024-01-03 DIAGNOSIS — Z510 Encounter for antineoplastic radiation therapy: Secondary | ICD-10-CM | POA: Diagnosis not present

## 2024-01-04 ENCOUNTER — Other Ambulatory Visit: Payer: Self-pay | Admitting: "Endocrinology

## 2024-01-04 DIAGNOSIS — E119 Type 2 diabetes mellitus without complications: Secondary | ICD-10-CM

## 2024-01-08 ENCOUNTER — Ambulatory Visit
Admission: RE | Admit: 2024-01-08 | Discharge: 2024-01-08 | Disposition: A | Discharge status: Home / Self Care | Source: Ambulatory Visit | Attending: Radiation Oncology | Admitting: Radiation Oncology

## 2024-01-08 ENCOUNTER — Other Ambulatory Visit: Payer: Self-pay

## 2024-01-08 DIAGNOSIS — C3431 Malignant neoplasm of lower lobe, right bronchus or lung: Secondary | ICD-10-CM | POA: Diagnosis not present

## 2024-01-08 DIAGNOSIS — Z87891 Personal history of nicotine dependence: Secondary | ICD-10-CM | POA: Diagnosis not present

## 2024-01-08 DIAGNOSIS — Z510 Encounter for antineoplastic radiation therapy: Secondary | ICD-10-CM | POA: Diagnosis not present

## 2024-01-08 LAB — RAD ONC ARIA SESSION SUMMARY
Course Elapsed Days: 0
Plan Fractions Treated to Date: 1
Plan Prescribed Dose Per Fraction: 18 Gy
Plan Total Fractions Prescribed: 3
Plan Total Prescribed Dose: 54 Gy
Reference Point Dosage Given to Date: 18 Gy
Reference Point Session Dosage Given: 18 Gy
Session Number: 1

## 2024-01-09 ENCOUNTER — Ambulatory Visit: Payer: Self-pay

## 2024-01-09 ENCOUNTER — Telehealth: Payer: Self-pay | Admitting: Family Medicine

## 2024-01-09 DIAGNOSIS — E119 Type 2 diabetes mellitus without complications: Secondary | ICD-10-CM

## 2024-01-09 NOTE — Telephone Encounter (Signed)
**Note De-identified  [place] Obfuscation** Please advise 

## 2024-01-09 NOTE — Telephone Encounter (Unsigned)
 Copied from CRM [id number]. Topic: Clinical - Medication Refill >> Jan 09, 2024  3:00 PM Chiquita SQUIBB wrote: Medication: repaglinide  (PRANDIN ) 1 MG tablet [[id number]]  Has the patient contacted their pharmacy? Yes- pharmacy calling in  (Agent: If no, request that the patient contact the pharmacy for the refill. If patient does not wish to contact the pharmacy document the reason why and proceed with request.) (Agent: If yes, when and what did the pharmacy advise?)  This is the patient's preferred pharmacy:  [id number] [place], [place] - [address] AT [facility] [address] [facility] [address] [facility] [address] [place] [address] Phone: [phone] Fax: [phone]   Is this the correct pharmacy for this prescription? Yes If no, delete pharmacy and type the correct one.   Has the prescription been filled recently? No  Is the patient out of the medication? Yes  Has the patient been seen for an appointment in the last year OR does the patient have an upcoming appointment? Yes  Can we respond through MyChart? Yes  Agent: Please be advised that Rx refills may take up to 3 business days. We ask that you follow-up with your pharmacy.

## 2024-01-09 NOTE — Telephone Encounter (Signed)
 Medication request of repaglinide  (PRANDIN ) was ordered by endocrinology. Call placed to patient to call back to Nurse Triage.

## 2024-01-09 NOTE — Telephone Encounter (Signed)
 Pt was advised by endo to have the prandin  ordered through her PCP. Pt no longer sees endo.

## 2024-01-10 ENCOUNTER — Other Ambulatory Visit: Payer: Self-pay

## 2024-01-10 ENCOUNTER — Ambulatory Visit
Admission: RE | Admit: 2024-01-10 | Discharge: 2024-01-10 | Disposition: A | Discharge status: Home / Self Care | Source: Ambulatory Visit | Attending: Radiation Oncology | Admitting: Radiation Oncology

## 2024-01-10 DIAGNOSIS — Z87891 Personal history of nicotine dependence: Secondary | ICD-10-CM | POA: Diagnosis not present

## 2024-01-10 DIAGNOSIS — Z510 Encounter for antineoplastic radiation therapy: Secondary | ICD-10-CM | POA: Diagnosis not present

## 2024-01-10 DIAGNOSIS — C3431 Malignant neoplasm of lower lobe, right bronchus or lung: Secondary | ICD-10-CM | POA: Diagnosis not present

## 2024-01-10 LAB — RAD ONC ARIA SESSION SUMMARY
Course Elapsed Days: 2
Plan Fractions Treated to Date: 2
Plan Prescribed Dose Per Fraction: 18 Gy
Plan Total Fractions Prescribed: 3
Plan Total Prescribed Dose: 54 Gy
Reference Point Dosage Given to Date: 36 Gy
Reference Point Session Dosage Given: 18 Gy
Session Number: 2

## 2024-01-11 ENCOUNTER — Ambulatory Visit: Admitting: Radiation Oncology

## 2024-01-11 ENCOUNTER — Ambulatory Visit: Payer: Self-pay

## 2024-01-11 NOTE — Telephone Encounter (Signed)
 Lvm for pt to call back.

## 2024-01-11 NOTE — Telephone Encounter (Signed)
 Copied from CRM [id number]. Topic: Clinical - Medication Refill >> Jan 09, 2024  3:00 PM Chiquita SQUIBB wrote: Medication: repaglinide  (PRANDIN ) 1 MG tablet [[id number]]  Has the patient contacted their pharmacy? Yes- pharmacy calling in  (Agent: If no, request that the patient contact the pharmacy for the refill. If patient does not wish to contact the pharmacy document the reason why and proceed with request.) (Agent: If yes, when and what did the pharmacy advise?)  This is the patient's preferred pharmacy:  [id number] [place],  - [address] AT [facility] OF [facility] [address] & [facility] [address] [place] [address] Phone: [phone] Fax: [phone]   Is this the correct pharmacy for this prescription? Yes If no, delete pharmacy and type the correct one.   Has the prescription been filled recently? No  Is the patient out of the medication? Yes  Has the patient been seen for an appointment in the last year OR does the patient have an upcoming appointment? Yes  Can we respond through MyChart? Yes  Agent: Please be advised that Rx refills may take up to 3 business days. We ask that you follow-up with your pharmacy. >> Jan 11, 2024  3:58 PM Rosina D wrote: Patient is out of her medication and she is checking on her refill

## 2024-01-11 NOTE — Telephone Encounter (Signed)
 Spoke to pt regarding refill, is has already call PCP for medication refill.

## 2024-01-14 ENCOUNTER — Ambulatory Visit
Admission: RE | Admit: 2024-01-14 | Discharge: 2024-01-14 | Disposition: A | Discharge status: Home / Self Care | Source: Ambulatory Visit | Attending: Radiation Oncology | Admitting: Radiation Oncology

## 2024-01-14 ENCOUNTER — Other Ambulatory Visit: Payer: Self-pay

## 2024-01-14 ENCOUNTER — Other Ambulatory Visit: Payer: Self-pay | Admitting: Family

## 2024-01-14 DIAGNOSIS — C3431 Malignant neoplasm of lower lobe, right bronchus or lung: Secondary | ICD-10-CM | POA: Diagnosis not present

## 2024-01-14 DIAGNOSIS — Z87891 Personal history of nicotine dependence: Secondary | ICD-10-CM | POA: Diagnosis not present

## 2024-01-14 DIAGNOSIS — Z510 Encounter for antineoplastic radiation therapy: Secondary | ICD-10-CM | POA: Diagnosis not present

## 2024-01-14 DIAGNOSIS — E119 Type 2 diabetes mellitus without complications: Secondary | ICD-10-CM

## 2024-01-14 LAB — RAD ONC ARIA SESSION SUMMARY
Course Elapsed Days: 6
Plan Fractions Treated to Date: 3
Plan Prescribed Dose Per Fraction: 18 Gy
Plan Total Fractions Prescribed: 3
Plan Total Prescribed Dose: 54 Gy
Reference Point Dosage Given to Date: 54 Gy
Reference Point Session Dosage Given: 18 Gy
Session Number: 3

## 2024-01-14 MED ORDER — REPAGLINIDE 1 MG PO TABS: 2.0000 mg | ORAL_TABLET | Freq: Every day | ORAL | 0 refills | Status: DC

## 2024-01-15 ENCOUNTER — Ambulatory Visit: Admitting: Radiation Oncology

## 2024-01-15 ENCOUNTER — Other Ambulatory Visit: Payer: Self-pay | Admitting: Radiation Oncology

## 2024-01-15 DIAGNOSIS — C3431 Malignant neoplasm of lower lobe, right bronchus or lung: Secondary | ICD-10-CM

## 2024-01-15 NOTE — Radiation Completion Notes (Signed)
  Radiation Oncology         [phone] ________________________________  Name: Alyssa Ford MRN: [id number]  Date of Service: 01/14/2024  DOB: 11-Dec-1950  End of Treatment Note   Diagnosis:   Putative Stage IA2, cT1bN0M0, NSCLC, of the RLL   Intent: Curative     ==========DELIVERED PLANS==========  First Treatment Date: 2024-01-08 Last Treatment Date: 2024-01-14   Plan Name: Lung_R_SBRT Site: Lung, Right Technique: SBRT/SRT-IMRT Mode: Photon Dose Per Fraction: 18 Gy Prescribed Dose (Delivered / Prescribed): 54 Gy / 54 Gy Prescribed Fxs (Delivered / Prescribed): 3 / 3     ==========ON TREATMENT VISIT DATES========== 2024-01-08, 2024-01-10, 2024-01-14      See weekly On Treatment Notes in Epic for details in the Media tab (listed as Progress notes on the On Treatment Visit Dates listed above). The patient tolerated radiation.    The patient will receive a call in about one month from the radiation oncology department. She will continue follow up with our department and we discussed the rationale for her first post treatment scan in 6-8 weeks as well.      Donald KYM Husband, PAC

## 2024-01-16 ENCOUNTER — Ambulatory Visit: Admitting: Radiation Oncology

## 2024-01-17 ENCOUNTER — Ambulatory Visit: Admitting: Radiation Oncology

## 2024-01-18 ENCOUNTER — Ambulatory Visit: Admitting: Radiation Oncology

## 2024-01-22 ENCOUNTER — Ambulatory Visit: Admitting: Radiation Oncology

## 2024-01-24 ENCOUNTER — Ambulatory Visit: Admitting: Radiation Oncology

## 2024-01-25 ENCOUNTER — Other Ambulatory Visit: Payer: Self-pay | Admitting: Family Medicine

## 2024-01-25 DIAGNOSIS — E1169 Type 2 diabetes mellitus with other specified complication: Secondary | ICD-10-CM

## 2024-01-25 DIAGNOSIS — E119 Type 2 diabetes mellitus without complications: Secondary | ICD-10-CM

## 2024-02-08 NOTE — Progress Notes (Addendum)
  Radiation Oncology         [phone] ________________________________  Name: Alyssa Ford MRN: [id number]  Date of Service: 02/11/2024  DOB: 1950-09-20  Post Treatment Telephone Note  Diagnosis: Putative Stage IA2, cT1bN0M0, NSCLC, of the RLL    The patient was available for call today.   Symptoms of fatigue have improved since completing therapy.  Symptoms of skin changes have improved since completing therapy.  Symptoms of esophagitis have improved since completing therapy.    The patient has scheduled to have CT scan on 03/03/24. Encouraged to call if  develops concerns or questions regarding radiation.    Spoke with patient on 02/08/24@ 4:15pm

## 2024-02-11 ENCOUNTER — Ambulatory Visit
Admission: RE | Admit: 2024-02-11 | Discharge: 2024-02-11 | Disposition: A | Discharge status: Home / Self Care | Source: Ambulatory Visit | Attending: Internal Medicine | Admitting: Internal Medicine

## 2024-02-11 DIAGNOSIS — R911 Solitary pulmonary nodule: Secondary | ICD-10-CM | POA: Insufficient documentation

## 2024-02-12 ENCOUNTER — Ambulatory Visit (INDEPENDENT_AMBULATORY_CARE_PROVIDER_SITE_OTHER)

## 2024-02-12 ENCOUNTER — Encounter: Payer: Self-pay | Admitting: Family Medicine

## 2024-02-12 ENCOUNTER — Ambulatory Visit: Admitting: Family Medicine

## 2024-02-12 VITALS — Ht 66.0 in | Wt 139.0 lb

## 2024-02-12 VITALS — BP 108/60 | HR 71 | Temp 97.8°F | Resp 16 | Ht 66.0 in | Wt 138.6 lb

## 2024-02-12 DIAGNOSIS — K5900 Constipation, unspecified: Secondary | ICD-10-CM | POA: Diagnosis not present

## 2024-02-12 DIAGNOSIS — I10 Essential (primary) hypertension: Secondary | ICD-10-CM

## 2024-02-12 DIAGNOSIS — E119 Type 2 diabetes mellitus without complications: Secondary | ICD-10-CM

## 2024-02-12 DIAGNOSIS — Z7984 Long term (current) use of oral hypoglycemic drugs: Secondary | ICD-10-CM | POA: Diagnosis not present

## 2024-02-12 DIAGNOSIS — E785 Hyperlipidemia, unspecified: Secondary | ICD-10-CM | POA: Diagnosis not present

## 2024-02-12 DIAGNOSIS — Z0000 Encounter for general adult medical examination without abnormal findings: Secondary | ICD-10-CM | POA: Diagnosis not present

## 2024-02-12 DIAGNOSIS — E1169 Type 2 diabetes mellitus with other specified complication: Secondary | ICD-10-CM | POA: Diagnosis not present

## 2024-02-12 DIAGNOSIS — Z1211 Encounter for screening for malignant neoplasm of colon: Secondary | ICD-10-CM

## 2024-02-12 DIAGNOSIS — E039 Hypothyroidism, unspecified: Secondary | ICD-10-CM | POA: Diagnosis not present

## 2024-02-12 LAB — COMPREHENSIVE METABOLIC PANEL WITH GFR
ALT: 16 U/L (ref 0–35)
AST: 17 U/L (ref 0–37)
Albumin: 4.3 g/dL (ref 3.5–5.2)
Alkaline Phosphatase: 39 U/L (ref 39–117)
BUN: 19 mg/dL (ref 6–23)
CO2: 28 meq/L (ref 19–32)
Calcium: 9.2 mg/dL (ref 8.4–10.5)
Chloride: 101 meq/L (ref 96–112)
Creatinine, Ser: 1.05 mg/dL (ref 0.40–1.20)
GFR: 52.57 mL/min — ABNORMAL LOW (ref >60.00)
Glucose, Bld: 111 mg/dL — ABNORMAL HIGH (ref 70–99)
Potassium: 4.1 meq/L (ref 3.5–5.1)
Sodium: 138 meq/L (ref 135–145)
Total Bilirubin: 0.3 mg/dL (ref 0.2–1.2)
Total Protein: 6.7 g/dL (ref 6.0–8.3)

## 2024-02-12 LAB — CBC WITH DIFFERENTIAL/PLATELET
Basophils Absolute: 0.1 K/uL (ref 0.0–0.1)
Basophils Relative: 1 % (ref 0.0–3.0)
Eosinophils Absolute: 0.1 K/uL (ref 0.0–0.7)
Eosinophils Relative: 2.5 % (ref 0.0–5.0)
HCT: 36.2 % (ref 36.0–46.0)
Hemoglobin: 11.9 g/dL — ABNORMAL LOW (ref 12.0–15.0)
Lymphocytes Relative: 35 % (ref 12.0–46.0)
Lymphs Abs: 1.8 K/uL (ref 0.7–4.0)
MCHC: 32.8 g/dL (ref 30.0–36.0)
MCV: 86.5 fl (ref 78.0–100.0)
Monocytes Absolute: 0.4 K/uL (ref 0.1–1.0)
Monocytes Relative: 7.7 % (ref 3.0–12.0)
Neutro Abs: 2.8 K/uL (ref 1.4–7.7)
Neutrophils Relative %: 53.8 % (ref 43.0–77.0)
Platelets: 236 K/uL (ref 150.0–400.0)
RBC: 4.19 Mil/uL (ref 3.87–5.11)
RDW: 14.3 % (ref 11.5–15.5)
WBC: 5.3 K/uL (ref 4.0–10.5)

## 2024-02-12 LAB — LIPID PANEL
Cholesterol: 128 mg/dL (ref 0–200)
HDL: 41.6 mg/dL (ref >39.00)
LDL Cholesterol: 53 mg/dL (ref 0–99)
NonHDL: 86.74
Total CHOL/HDL Ratio: 3
Triglycerides: 169 mg/dL — ABNORMAL HIGH (ref 0.0–149.0)
VLDL: 33.8 mg/dL (ref 0.0–40.0)

## 2024-02-12 LAB — HEMOGLOBIN A1C: Hgb A1c MFr Bld: 6.8 % — ABNORMAL HIGH (ref 4.6–6.5)

## 2024-02-12 LAB — TSH: TSH: 2.45 u[IU]/mL (ref 0.35–5.50)

## 2024-02-12 NOTE — Patient Instructions (Signed)

## 2024-02-12 NOTE — Progress Notes (Signed)
 Subjective:    Patient ID: Alyssa Ford, female    DOB: 1950-09-11, 73 y.o.   MRN: [id number]  Chief Complaint  Patient presents with   Diabetes   Follow-up    HPI Patient is in today for f/u dm and constipation.  Discussed the use of AI scribe software for clinical note transcription with the patient, who gave verbal consent to proceed.  History of Present Illness Alyssa Ford is a 73 year old female who presents with irregular bowel movements and concerns about medication side effects.  She experiences irregular bowel movements, which she attributes to her medication, Rybelsus . She has tried various treatments including Metamucil, which only softened her stools without increasing the urge to defecate. She also used milk of magnesia, Senokot, Ducalex, and Miralax , but these have not been effective in providing relief. Ducalex initially worked but has since become ineffective, and Miralax  causes bloating.  Her blood sugar levels are around 150-160 mg/dL a couple of hours after eating, but she does not check her levels in the morning due to drowsiness upon waking.  She has not vaped in three months and has successfully stopped using nicotine products. She previously vaped non-nicotine products, which she found easier to quit than nicotine-containing ones.  She occasionally experiences memory lapses, particularly when interrupted during conversations, which her husband has also noticed.   Past Medical History:  Diagnosis Date   Anemia    Anxiety    Arthritis    Cataract    Chronic headaches    pt states that they have improved - 08/06/23   Coronary artery disease    Depression    Diabetes mellitus    Diverticulosis of colon (without mention of hemorrhage) 2003, 2008   Colonoscopy   Eczema    Emphysema of lung (HCC)    GERD (gastroesophageal reflux disease)    Glaucoma    Hiatal hernia 2003   EGD    History of pneumonia 11/2022   Hyperlipidemia    Hypertension     PONV (postoperative nausea and vomiting)    Thyroid  nodule     Past Surgical History:  Procedure Laterality Date   BRONCHIAL BIOPSY  08/07/2023   Procedure: BRONCHIAL BIOPSIES;  Surgeon: Shelah Lamar RAMAN, MD;  Location: [facility];  Service: Pulmonary;;   BRONCHIAL BRUSHINGS  08/07/2023   Procedure: BRONCHIAL BRUSHINGS;  Surgeon: Shelah Lamar RAMAN, MD;  Location: [facility] ENDOSCOPY;  Service: Pulmonary;;   BRONCHIAL NEEDLE ASPIRATION BIOPSY  08/07/2023   Procedure: BRONCHIAL NEEDLE ASPIRATION BIOPSIES;  Surgeon: Shelah Lamar RAMAN, MD;  Location: [facility] ENDOSCOPY;  Service: Pulmonary;;   CARPAL TUNNEL RELEASE  2006   CARPAL TUNNEL RELEASE  12/24/2007   CATARACT EXTRACTION, BILATERAL     [facility]---  sept and oct 2018   CERVICAL FUSION  08/2006   CHOLECYSTECTOMY     COSMETIC SURGERY  December 09 2018   EYE SURGERY  catracts   FASCIECTOMY Left 08/01/2018   Procedure: FASCIECTOMY LEFT RING;  Surgeon: Murrell Kuba, MD;  Location: [facility];  Service: Orthopedics;  Laterality: Left;  UPPER ARM   FIDUCIAL MARKER PLACEMENT  08/07/2023   Procedure: FIDUCIAL MARKER PLACEMENT;  Surgeon: Shelah Lamar RAMAN, MD;  Location: [facility] ENDOSCOPY;  Service: Pulmonary;;   SPINE SURGERY  neck dic replaced   TRIGGER FINGER RELEASE Left 08/01/2018   Procedure: RELEASE TRIGGER FINGER/A-1 PULLEY LEFTRING;  Surgeon: Murrell Kuba, MD;  Location: [facility];  Service: Orthopedics;  Laterality: Left;   TUBAL  LIGATION      Family History  Problem Relation Age of Onset   Angioedema Mother    Dementia Mother    Breast cancer Mother 73   Kidney failure Mother 38       died from kidney failure from abx given to her for pneumonia   Hyperlipidemia Mother    Hypertension Mother    Diabetes Mother    Cancer Mother 1       breast   Parkinsonism Father    Diabetes Father 64   Hypertension Sister    Diabetes Brother    Hypertension Brother    Heart attack Brother    Heart disease Cousin 11       mi   Colon  cancer Neg Hx    Esophageal cancer Neg Hx    Stomach cancer Neg Hx     Social History   Socioeconomic History   Marital status: Married    Spouse name: Not on file   Number of children: Not on file   Years of education: Not on file   Highest education level: Some college, no degree  Occupational History   Occupation: retired  Tobacco Use   Smoking status: Former    Current packs/day: 0.00    Average packs/day: 1.4 packs/day for 49.0 years (68.6 ttl pk-yrs)    Types: Cigarettes    Start date: 07/15/1966    Quit date: 07/16/2015    Years since quitting: 8.5    Passive exposure: Past   Smokeless tobacco: Never   Tobacco comments:    Has not vaped in 2 weeks 12/05/2023  Vaping Use   Vaping status: Some Days  Substance and Sexual Activity   Alcohol use: Yes    Alcohol/week: 0.0 standard drinks of alcohol    Comment: once weekly; wine and beer   Drug use: No   Sexual activity: Not Currently    Partners: Male  Other Topics Concern   Not on file  Social History Narrative   Exercise--  walk   Social Drivers of Health   Financial Resource Strain: Low Risk  (02/12/2024)   Overall Financial Resource Strain (CARDIA)    Difficulty of Paying Living Expenses: Not very hard  Food Insecurity: No Food Insecurity (02/12/2024)   Hunger Vital Sign    Worried About Running Out of Food in the Last Year: Never true    Ran Out of Food in the Last Year: Never true  Transportation Needs: No Transportation Needs (02/12/2024)   PRAPARE - Administrator, Civil Service (Medical): No    Lack of Transportation (Non-Medical): No  Physical Activity: Insufficiently Active (02/12/2024)   Exercise Vital Sign    Days of Exercise per Week: 4 days    Minutes of Exercise per Session: 30 min  Stress: Stress Concern Present (02/12/2024)   [employer] of Occupational Health - Occupational Stress Questionnaire    Feeling of Stress: Rather much  Social Connections: Moderately Isolated  (02/12/2024)   Social Connection and Isolation Panel    Frequency of Communication with Friends and Family: More than three times a week    Frequency of Social Gatherings with Friends and Family: Three times a week    Attends Religious Services: Patient declined    Active Member of Clubs or Organizations: No    Attends Banker Meetings: Not on file    Marital Status: Married  Intimate Partner Violence: Not At Risk (02/12/2024)   Humiliation, Afraid, Rape, and Kick questionnaire  Fear of Current or Ex-Partner: No    Emotionally Abused: No    Physically Abused: No    Sexually Abused: No    Outpatient Medications Prior to Visit  Medication Sig Dispense Refill   amitriptyline  (ELAVIL ) 25 MG tablet Take 2 tablets (50 mg total) by mouth at bedtime.     aspirin  EC 81 MG tablet Take 81 mg by mouth daily.     BIOTIN PO Take 1 tablet by mouth daily.      Coenzyme Q10 (CO Q 10 PO) Take 1 capsule by mouth daily.     fenofibrate  160 MG tablet Take 1 tablet (160 mg total) by mouth daily. 90 tablet 1   Ferrous Sulfate (IRON SUPPLEMENT PO) Take 1 tablet by mouth daily. 65mg      fluticasone  (FLONASE ) 50 MCG/ACT nasal spray Place 2 sprays into both nostrils daily as needed for allergies.     ipratropium (ATROVENT ) 0.06 % nasal spray Place 2 sprays into both nostrils 3 (three) times daily as needed for rhinitis. 15 mL 5   latanoprost  (XALATAN ) 0.005 % ophthalmic solution Place 1 drop into both eyes at bedtime.      lisinopril  (ZESTRIL ) 5 MG tablet Take 1 tablet (5 mg total) by mouth daily. 90 tablet 1   metFORMIN  (GLUCOPHAGE -XR) 500 MG 24 hr tablet TAKE 4 TABLETS BY MOUTH EVERY DAY 360 tablet 1   montelukast  (SINGULAIR ) 10 MG tablet Take 1 tablet (10 mg total) by mouth at bedtime. 90 tablet 3   Naproxen Sodium (ALEVE PO) Take 2 tablets by mouth daily as needed (pain).     NONFORMULARY OR COMPOUNDED ITEM Take 1 tablet by mouth at bedtime. Cbd /delta 9 1 po qhs     omeprazole  (PRILOSEC) 20 MG  capsule TAKE 1 CAPSULE BY MOUTH DAILY 100 capsule 2   repaglinide  (PRANDIN ) 1 MG tablet Take 2 tablets (2 mg total) by mouth daily. Before evening meal. 180 tablet 0   rosuvastatin  (CRESTOR ) 40 MG tablet TAKE 1 TABLET(40 MG) BY MOUTH AT BEDTIME 90 tablet 1   Semaglutide  (RYBELSUS ) 7 MG TABS Take 1 tablet (7 mg total) by mouth daily.     No facility-administered medications prior to visit.    Allergies  Allergen Reactions   Codeine Nausea Only    Severe nausea   Augmentin  [Amoxicillin -Pot Clavulanate]     Severe nausea and vomitting    Review of Systems  Constitutional:  Negative for fever and malaise/fatigue.  HENT:  Negative for congestion.   Eyes:  Negative for blurred vision.  Respiratory:  Negative for shortness of breath.   Cardiovascular:  Negative for chest pain, palpitations and leg swelling.  Gastrointestinal:  Positive for constipation. Negative for abdominal pain, blood in stool, diarrhea and nausea.  Genitourinary:  Negative for dysuria and frequency.  Musculoskeletal:  Negative for falls.  Skin:  Negative for rash.  Neurological:  Negative for dizziness, loss of consciousness and headaches.  Endo/Heme/Allergies:  Negative for environmental allergies.  Psychiatric/Behavioral:  Negative for depression. The patient is not nervous/anxious.        Objective:    Physical Exam Vitals and nursing note reviewed.  Constitutional:      General: She is not in acute distress.    Appearance: Normal appearance. She is well-developed.  HENT:     Head: Normocephalic and atraumatic.  Eyes:     General: No scleral icterus.       Right eye: No discharge.        Left  eye: No discharge.  Cardiovascular:     Rate and Rhythm: Normal rate and regular rhythm.     Heart sounds: No murmur heard. Pulmonary:     Effort: Pulmonary effort is normal. No respiratory distress.     Breath sounds: Normal breath sounds.  Musculoskeletal:        General: Normal range of motion.      Cervical back: Normal range of motion and neck supple.     Right lower leg: No edema.     Left lower leg: No edema.  Skin:    General: Skin is warm and dry.  Neurological:     Mental Status: She is alert and oriented to person, place, and time.  Psychiatric:        Mood and Affect: Mood normal.        Behavior: Behavior normal.        Thought Content: Thought content normal.        Judgment: Judgment normal.     BP 108/60 (BP Location: Right Arm, Patient Position: Sitting, Cuff Size: Normal)   Pulse 71   Temp 97.8 F (36.6 C) (Oral)   Resp 16   Ht 5' 6 (1.676 m)   Wt 138 lb 9.6 oz (62.9 kg)   SpO2 98%   BMI 22.37 kg/m  Wt Readings from Last 3 Encounters:  02/12/24 138 lb 9.6 oz (62.9 kg)  02/12/24 139 lb (63 kg)  12/11/23 140 lb (63.5 kg)    Diabetic Foot Exam - Simple   Simple Foot Form Diabetic Foot exam was performed with the following findings: Yes 02/12/2024 12:32 PM  Visual Inspection No deformities, no ulcerations, no other skin breakdown bilaterally: Yes Sensation Testing Intact to touch and monofilament testing bilaterally: Yes Pulse Check Posterior Tibialis and Dorsalis pulse intact bilaterally: Yes Comments    Lab Results  Component Value Date   WBC 6.6 11/23/2023   HGB 11.9 (L) 11/23/2023   HCT 36.0 11/23/2023   PLT 260.0 11/23/2023   GLUCOSE 135 (H) 11/23/2023   CHOL 136 11/23/2023   TRIG 115.0 11/23/2023   HDL 46.90 11/23/2023   LDLDIRECT 87.0 06/07/2020   LDLCALC 66 11/23/2023   ALT 14 11/23/2023   AST 16 11/23/2023   NA 140 11/23/2023   K 3.8 11/23/2023   CL 103 11/23/2023   CREATININE 0.97 11/23/2023   BUN 19 11/23/2023   CO2 27 11/23/2023   TSH 4.20 08/02/2023   INR 1.3 (H) 11/26/2022   HGBA1C 6.6 (H) 11/23/2023   MICROALBUR 0.7 11/23/2023    Lab Results  Component Value Date   TSH 4.20 08/02/2023   Lab Results  Component Value Date   WBC 6.6 11/23/2023   HGB 11.9 (L) 11/23/2023   HCT 36.0 11/23/2023   MCV 84.0 11/23/2023    PLT 260.0 11/23/2023   Lab Results  Component Value Date   NA 140 11/23/2023   K 3.8 11/23/2023   CO2 27 11/23/2023   GLUCOSE 135 (H) 11/23/2023   BUN 19 11/23/2023   CREATININE 0.97 11/23/2023   BILITOT 0.3 11/23/2023   ALKPHOS 44 11/23/2023   AST 16 11/23/2023   ALT 14 11/23/2023   PROT 7.0 11/23/2023   ALBUMIN 4.6 11/23/2023   CALCIUM  9.6 11/23/2023   ANIONGAP 14 12/29/2022   GFR 57.90 (L) 11/23/2023   Lab Results  Component Value Date   CHOL 136 11/23/2023   Lab Results  Component Value Date   HDL 46.90 11/23/2023   Lab Results  Component Value Date   LDLCALC 66 11/23/2023   Lab Results  Component Value Date   TRIG 115.0 11/23/2023   Lab Results  Component Value Date   CHOLHDL 3 11/23/2023   Lab Results  Component Value Date   HGBA1C 6.6 (H) 11/23/2023       Assessment & Plan:  Controlled type 2 diabetes mellitus without complication, without long-term current use of insulin  (HCC) -     CBC with Differential/Platelet -     Comprehensive metabolic panel with GFR -     Lipid panel -     Hemoglobin A1c  Hyperlipidemia associated with type 2 diabetes mellitus (HCC) -     Lipid panel  Essential hypertension -     Comprehensive metabolic panel with GFR  Hypothyroidism, unspecified type -     TSH  Assessment and Plan Assessment & Plan Constipation   Chronic constipation is likely exacerbated by Rybelsus . Previous treatments with Metamucil, milk of magnesia, Ducalex, and Miralax  have shown limited success. Rybelsus  can cause constipation, and if bowel regularity is achieved but symptoms persist, a medication change may be necessary. Recommend using Senokot with Ducalex as per package instructions. Monitor bowel regularity and report if symptoms persist or worsen. Consider alternative diabetes management if constipation remains unresolved.  Type 2 diabetes mellitus   Type 2 diabetes mellitus is managed with Rybelsus , with postprandial blood glucose  levels at 150-160 mg/dL. Rybelsus  may contribute to constipation. A potential switch to insulin  was discussed if constipation persists and cannot be managed with the current regimen. Insulin  is not preferred due to potential weight gain but may be necessary if other options fail. Continue monitoring blood glucose levels and consider insulin  therapy if constipation persists and cannot be managed with the current regimen.    Todd Argabright R Lowne Chase, DO

## 2024-02-12 NOTE — Patient Instructions (Addendum)
 Ms. Desmith , Thank you for taking time out of your busy schedule to complete your Annual Wellness Visit with me. I enjoyed our conversation and look forward to speaking with you again next year. I, as well as your care team,  appreciate your ongoing commitment to your health goals. Please review the following plan we discussed and let me know if I can assist you in the future. Your Game plan/ To Do List    Referrals: If you haven't heard from the office you've been referred to, please reach out to them at the phone provided.   Follow up Visits: We will see or speak with you next year for your Next Medicare AWV with our clinical staff Have you seen your provider in the last 6 months (3 months if uncontrolled diabetes)?   Clinician Recommendations:  Aim for 30 minutes of exercise or brisk walking, 6-8 glasses of water, and 5 servings of fruits and vegetables each day.       This is a list of the screenings recommended for you:  Health Maintenance  Topic Date Due   COVID-19 Vaccine (8 - Pfizer risk 2024-25 season) 10/09/2023   Flu Shot  01/25/2024   Colon Cancer Screening  05/11/2024   Hemoglobin A1C  05/25/2024   Complete foot exam   08/01/2024   Eye exam for diabetics  08/28/2024   Mammogram  11/13/2024   Yearly kidney function blood test for diabetes  11/22/2024   Yearly kidney health urinalysis for diabetes  11/22/2024   DEXA scan (bone density measurement)  01/29/2025   Medicare Annual Wellness Visit  02/11/2025   DTaP/Tdap/Td vaccine (4 - Td or Tdap) 02/20/2029   Pneumococcal Vaccine for age over 1  Completed   Hepatitis C Screening  Completed   Zoster (Shingles) Vaccine  Completed   HPV Vaccine  Aged Out   Meningitis B Vaccine  Aged Out   Screening for Lung Cancer  Discontinued   Pneumococcal Vaccine  Discontinued    Advanced directives: (Declined) Advance directive discussed with you today. Even though you declined this today, please call our office should you change your  mind, and we can give you the proper paperwork for you to fill out. Advance Care Planning is important because it:  [x]  Makes sure you receive the medical care that is consistent with your values, goals, and preferences  [x]  It provides guidance to your family and loved ones and reduces their decisional burden about whether or not they are making the right decisions based on your wishes.  Follow the link provided in your after visit summary or read over the paperwork we have mailed to you to help you started getting your Advance Directives in place. If you need assistance in completing these, please reach out to us  so that we can help you!  See attachments for Preventive Care and Fall Prevention Tips.

## 2024-02-12 NOTE — Progress Notes (Signed)
 Subjective:   Alyssa Ford is a 73 y.o. who presents for a Medicare Wellness preventive visit.  As a reminder, Annual Wellness Visits don't include a physical exam, and some assessments may be limited, especially if this visit is performed virtually. We may recommend an in-person follow-up visit with your provider if needed.  Visit Complete: Virtual I connected with  Symphanie H Brenton on 02/12/24 by a audio enabled telemedicine application and verified that I am speaking with the correct person using two identifiers.  Patient Location: Home  Provider Location: Home Office  I discussed the limitations of evaluation and management by telemedicine. The patient expressed understanding and agreed to proceed.  Vital Signs: Because this visit was a virtual/telehealth visit, some criteria may be missing or patient reported. Any vitals not documented were not able to be obtained and vitals that have been documented are patient reported.    Persons Participating in Visit: Patient.  AWV Questionnaire: Yes: Patient Medicare AWV questionnaire was completed by the patient on 02/12/24; I have confirmed that all information answered by patient is correct and no changes since this date.  Cardiac Risk Factors include: advanced age (>44men, >53 women);diabetes mellitus;hypertension     Objective:    Today's Vitals   02/12/24 0846  Weight: 139 lb (63 kg)  Height: 5' 6 (1.676 m)   Body mass index is 22.44 kg/m.     02/12/2024    8:54 AM 12/06/2023    9:54 AM 11/14/2023   12:26 PM 08/07/2023    7:10 AM 02/08/2023    1:04 PM 12/29/2022    5:09 PM 11/26/2022   12:42 PM  Advanced Directives  Does Patient Have a Medical Advance Directive? No No No No No No No  Would patient like information on creating a medical advance directive? No - Patient declined No - Patient declined No - Patient declined No - Patient declined No - Patient declined No - Patient declined No - Patient declined    Current  Medications (verified) Outpatient Encounter Medications as of 02/12/2024  Medication Sig   amitriptyline  (ELAVIL ) 25 MG tablet Take 2 tablets (50 mg total) by mouth at bedtime.   aspirin  EC 81 MG tablet Take 81 mg by mouth daily.   BIOTIN PO Take 1 tablet by mouth daily.    Coenzyme Q10 (CO Q 10 PO) Take 1 capsule by mouth daily.   fenofibrate  160 MG tablet Take 1 tablet (160 mg total) by mouth daily.   Ferrous Sulfate (IRON SUPPLEMENT PO) Take 1 tablet by mouth daily. 65mg    fluticasone  (FLONASE ) 50 MCG/ACT nasal spray Place 2 sprays into both nostrils daily as needed for allergies.   ipratropium (ATROVENT ) 0.06 % nasal spray Place 2 sprays into both nostrils 3 (three) times daily as needed for rhinitis.   latanoprost  (XALATAN ) 0.005 % ophthalmic solution Place 1 drop into both eyes at bedtime.    lisinopril  (ZESTRIL ) 5 MG tablet Take 1 tablet (5 mg total) by mouth daily.   metFORMIN  (GLUCOPHAGE -XR) 500 MG 24 hr tablet TAKE 4 TABLETS BY MOUTH EVERY DAY   montelukast  (SINGULAIR ) 10 MG tablet Take 1 tablet (10 mg total) by mouth at bedtime.   Naproxen Sodium (ALEVE PO) Take 2 tablets by mouth daily as needed (pain).   NONFORMULARY OR COMPOUNDED ITEM Take 1 tablet by mouth at bedtime. Cbd /delta 9 1 po qhs   omeprazole  (PRILOSEC) 20 MG capsule TAKE 1 CAPSULE BY MOUTH DAILY   repaglinide  (PRANDIN ) 1 MG tablet  Take 2 tablets (2 mg total) by mouth daily. Before evening meal.   rosuvastatin  (CRESTOR ) 40 MG tablet TAKE 1 TABLET(40 MG) BY MOUTH AT BEDTIME   Semaglutide  (RYBELSUS ) 7 MG TABS Take 1 tablet (7 mg total) by mouth daily.   No facility-administered encounter medications on file as of 02/12/2024.    Allergies (verified) Codeine and Augmentin  [amoxicillin -pot clavulanate]   History: Past Medical History:  Diagnosis Date   Anemia    Anxiety    Arthritis    Cataract    Chronic headaches    pt states that they have improved - 08/06/23   Coronary artery disease    Depression     Diabetes mellitus    Diverticulosis of colon (without mention of hemorrhage) 2003, 2008   Colonoscopy   Eczema    Emphysema of lung (HCC)    GERD (gastroesophageal reflux disease)    Glaucoma    Hiatal hernia 2003   EGD    History of pneumonia 11/2022   Hyperlipidemia    Hypertension    PONV (postoperative nausea and vomiting)    Thyroid  nodule    Past Surgical History:  Procedure Laterality Date   BRONCHIAL BIOPSY  08/07/2023   Procedure: BRONCHIAL BIOPSIES;  Surgeon: Shelah Lamar RAMAN, MD;  Location: [facility];  Service: Pulmonary;;   BRONCHIAL BRUSHINGS  08/07/2023   Procedure: BRONCHIAL BRUSHINGS;  Surgeon: Shelah Lamar RAMAN, MD;  Location: [facility] END[facility];  Service: Pulmonary;;   BRONCHIAL NEEDLE ASPIRATION BIOPSY  08/07/2023   Procedure: BRONCHIAL NEEDLE ASPIRATION BIOPSIES;  Surgeon: Shelah Lamar RAMAN, MD;  Location: [facility];  Service: Pulmonary;;   CARPAL TUNNEL RELEASE  2006   CARPAL TUNNEL RELEASE  12/24/2007   CATARACT EXTRACTION, BILATERAL     [place]---  sept and oct 2018   CERVICAL FUSION  08/2006   CHOLECYSTECTOMY     COSMETIC SURGERY  December 09 2018   EYE SURGERY  catracts   FASCIECTOMY Left 08/01/2018   Procedure: FASCIECTOMY LEFT RING;  Surgeon: Murrell Kuba, MD;  Location: [facility];  Service: Orthopedics;  Laterality: Left;  UPPER ARM   FIDUCIAL MARKER PLACEMENT  08/07/2023   Procedure: FIDUCIAL MARKER PLACEMENT;  Surgeon: Shelah Lamar RAMAN, MD;  Location: [facility] END[facility]COPY;  Service: Pulmonary;;   SPINE SURGERY  neck dic replaced   TRIGGER FINGER RELEASE Left 08/01/2018   Procedure: RELEASE TRIGGER FINGER/A-1 PULLEY LEFTRING;  Surgeon: Murrell Kuba, MD;  Location: [facility];  Service: Orthopedics;  Laterality: Left;   TUBAL LIGATION     Family History  Problem Relation Age of Onset   Angioedema Mother    Dementia Mother    Breast cancer Mother 67   Kidney failure Mother 60       died from kidney failure from abx given to her for  pneumonia   Hyperlipidemia Mother    Hypertension Mother    Diabetes Mother    Cancer Mother 36       breast   Parkinsonism Father    Diabetes Father 30   Hypertension Sister    Diabetes Brother    Hypertension Brother    Heart attack Brother    Heart disease Cousin 77       mi   Colon cancer Neg Hx    Esophageal cancer Neg Hx    Stomach cancer Neg Hx    Social History   Socioeconomic History   Marital status: Married    Spouse name: Not on file   Number  of children: Not on file   Years of education: Not on file   Highest education level: Some college, no degree  Occupational History   Occupation: retired  Tobacco Use   Smoking status: Former    Current packs/day: 0.00    Average packs/day: 1.4 packs/day for 49.0 years (68.6 ttl pk-yrs)    Types: Cigarettes    Start date: 07/15/1966    Quit date: 07/16/2015    Years since quitting: 8.5    Passive exposure: Past   Smokeless tobacco: Never   Tobacco comments:    Has not vaped in 2 weeks 12/05/2023  Vaping Use   Vaping status: Some Days  Substance and Sexual Activity   Alcohol use: Yes    Alcohol/week: 0.0 standard drinks of alcohol    Comment: once weekly; wine and beer   Drug use: No   Sexual activity: Not Currently    Partners: Male  Other Topics Concern   Not on file  Social History Narrative   Exercise--  walk   Social Drivers of Health   Financial Resource Strain: Low Risk  (02/12/2024)   Overall Financial Resource Strain (CARDIA)    Difficulty of Paying Living Expenses: Not very hard  Food Insecurity: No Food Insecurity (02/12/2024)   Hunger Vital Sign    Worried About Running Out of Food in the Last Year: Never true    Ran Out of Food in the Last Year: Never true  Transportation Needs: No Transportation Needs (02/12/2024)   PRAPARE - Administrator, Civil Service (Medical): No    Lack of Transportation (Non-Medical): No  Physical Activity: Insufficiently Active (02/12/2024)   Exercise Vital  Sign    Days of Exercise per Week: 4 days    Minutes of Exercise per Session: 30 min  Stress: Stress Concern Present (02/12/2024)   [employer] of Occupational Health - Occupational Stress Questionnaire    Feeling of Stress: Rather much  Social Connections: Moderately Isolated (02/12/2024)   Social Connection and Isolation Panel    Frequency of Communication with Friends and Family: More than three times a week    Frequency of Social Gatherings with Friends and Family: Three times a week    Attends Religious Services: Patient declined    Active Member of Clubs or Organizations: No    Attends Banker Meetings: Not on file    Marital Status: Married    Tobacco Counseling Counseling given: Not Answered Tobacco comments: Has not vaped in 2 weeks 12/05/2023    Clinical Intake:  Pre-visit preparation completed: Yes  Pain : No/denies pain     BMI - recorded: 22.44 Nutritional Status: BMI of 19-24  Normal Nutritional Risks: None Diabetes: Yes CBG done?: No Did pt. bring in CBG monitor from home?: No  Lab Results  Component Value Date   HGBA1C 6.6 (H) 11/23/2023   HGBA1C 6.6 (H) 08/02/2023   HGBA1C 6.4 02/12/2023     How often do you need to have someone help you when you read instructions, pamphlets, or other written materials from your doctor or pharmacy?: 1 - Never  Interpreter Needed?: No  Information entered by :: Rojelio Blush LPN   Activities of Daily Living     02/12/2024    8:50 AM 02/10/2024    6:33 AM  In your present state of health, do you have any difficulty performing the following activities:  Hearing? 0 0  Vision? 0 0  Difficulty concentrating or making decisions? 0 0  Walking or climbing stairs? 0 0  Dressing or bathing? 0 0  Doing errands, shopping? 0 0  Preparing Food and eating ? N N  Using the Toilet? N N  In the past six months, have you accidently leaked urine? CINDERELLA CINDERELLA  Comment Wears Pads. Followed by PCP   Do you have  problems with loss of bowel control? N N  Managing your Medications? N N  Managing your Finances? N N  Housekeeping or managing your Housekeeping? N N    Patient Care Team: Antonio Meth, Jamee SAUNDERS, DO as PCP - General Camillo Golas, MD as Consulting Physician (Ophthalmology) Junior Emmit Ivanoff, MD as Referring Physician (Dermatology) Burnie Lamar Gardiner HEATH, MD as Referring Physician (Pediatrics) Carla Milling, RPH-CPP (Pharmacist)  I have updated your Care Teams any recent Medical Services you may have received from other providers in the past year.     Assessment:   This is a routine wellness examination for Jerris.  Hearing/Vision screen Hearing Screening - Comments:: Denies hearing difficulties   Vision Screening - Comments:: Wears rx glasses - up to date with routine eye exams with  [facility]   Goals Addressed               This Visit's Progress     Increase physical activity (pt-stated)        Remain active.       Depression Screen     02/12/2024    8:58 AM 12/06/2023   10:15 AM 02/08/2023    1:06 PM 02/06/2022    1:07 PM 01/31/2022    1:16 PM 01/18/2021    9:04 AM 01/06/2020    2:48 PM  PHQ 2/9 Scores  PHQ - 2 Score 0 0 1 0 0 0 0    Fall Risk     02/12/2024    8:52 AM 02/10/2024    6:33 AM 12/05/2023    8:34 AM 08/14/2023   11:04 AM 07/23/2023    2:34 PM  Fall Risk   Falls in the past year? 1 1 1  0 1  Number falls in past yr: 0  0  0  Injury with Fall? 0 1 0  0  Comment Followed by medical attention      Risk for fall due to : No Fall Risks      Follow up Falls evaluation completed        MEDICARE RISK AT HOME:  Medicare Risk at Home Any stairs in or around the home?: No If so, are there any without handrails?: No Home free of loose throw rugs in walkways, pet beds, electrical cords, etc?: Yes Adequate lighting in your home to reduce risk of falls?: Yes Life alert?: No Use of a cane, walker or w/c?: No Grab bars in the bathroom?:  Yes Shower chair or bench in shower?: No Elevated toilet seat or a handicapped toilet?: Yes  TIMED UP AND GO:  Was the test performed?  No  Cognitive Function: 6CIT completed    12/14/2016    9:20 AM  MMSE - Mini Mental State Exam  Orientation to time 5   Orientation to Place 5   Registration 3   Attention/ Calculation 5   Recall 3   Language- name 2 objects 2   Language- repeat 1  Language- follow 3 step command 3   Language- read & follow direction 1   Write a sentence 1   Copy design 1   Total score 30  Data saved with a previous flowsheet row definition        02/12/2024    8:54 AM 02/08/2023    1:08 PM 02/06/2022    1:15 PM 01/18/2021    9:13 AM 01/06/2020    2:46 PM  6CIT Screen  What Year? 0 points 0 points 0 points 0 points 0 points  What month? 0 points 0 points 0 points 0 points 0 points  What time? 0 points 0 points 0 points 0 points 0 points  Count back from 20 0 points 0 points 0 points 0 points 0 points  Months in reverse 0 points 0 points 0 points 0 points 0 points  Repeat phrase 0 points 6 points 2 points 0 points   Total Score 0 points 6 points 2 points 0 points     Immunizations Immunization History  Administered Date(s) Administered   Fluad Quad(high Dose 65+) 04/08/2019, 04/08/2020   Influenza Split 04/13/2011   Influenza Whole 04/17/2006   Influenza, High Dose Seasonal PF 04/12/2016, 05/07/2017, 04/08/2018, 03/23/2022   Influenza, Seasonal, Injecte, Preservative Fre 05/22/2012   Influenza,inj,Quad PF,6+ Mos 03/03/2014, 03/18/2015   Influenza-Unspecified 05/07/2017, 05/02/2021, 04/10/2023   PFIZER(Purple Top)SARS-COV-2 Vaccination 07/21/2019, 08/11/2019, 04/27/2020, 11/23/2020   Pfizer Covid-19 Vaccine Bivalent Booster 22yrs & up 05/03/2021   Pneumococcal Conjugate-13 09/13/2015, 05/07/2017, 04/15/2019   Pneumococcal Polysaccharide-23 03/31/2003, 11/03/2008, 05/07/2017   Respiratory Syncytial Virus Vaccine ,Recomb Aduvanted(Arexvy )  06/02/2022   Td 10/24/1999, 02/23/2010   Tdap 02/21/2019   Unspecified SARS-COV-2 Vaccination 03/23/2022, 04/10/2023   Zoster Recombinant(Shingrix) 03/09/2017, 06/23/2017   Zoster, Live 12/25/2011    Screening Tests Health Maintenance  Topic Date Due   COVID-19 Vaccine (8 - Pfizer risk 2024-25 season) 10/09/2023   INFLUENZA VACCINE  01/25/2024   Colonoscopy  05/11/2024   HEMOGLOBIN A1C  05/25/2024   FOOT EXAM  08/01/2024   OPHTHALMOLOGY EXAM  08/28/2024   MAMMOGRAM  11/13/2024   Diabetic kidney evaluation - eGFR measurement  11/22/2024   Diabetic kidney evaluation - Urine ACR  11/22/2024   DEXA SCAN  01/29/2025   Medicare Annual Wellness (AWV)  02/11/2025   DTaP/Tdap/Td (4 - Td or Tdap) 02/20/2029   Pneumococcal Vaccine: 50+ Years  Completed   Hepatitis C Screening  Completed   Zoster Vaccines- Shingrix  Completed   HPV VACCINES  Aged Out   Meningococcal B Vaccine  Aged Out   Lung Cancer Screening  Discontinued   Pneumococcal Vaccine  Discontinued    Health Maintenance  Health Maintenance Due  Topic Date Due   COVID-19 Vaccine (8 - Pfizer risk 2024-25 season) 10/09/2023   INFLUENZA VACCINE  01/25/2024   Colonoscopy  05/11/2024   Health Maintenance Items Addressed: Referral sent to GI for colonoscopy  Additional Screening:  Vision Screening: Recommended annual ophthalmology exams for early detection of glaucoma and other disorders of the eye. Would you like a referral to an eye doctor? No    Dental Screening: Recommended annual dental exams for proper oral hygiene  Community Resource Referral / Chronic Care Management: CRR required this visit?  No   CCM required this visit?  No   Plan:    I have personally reviewed and noted the following in the patient's chart:   Medical and social history Use of alcohol, tobacco or illicit drugs  Current medications and supplements including opioid prescriptions. Patient is not currently taking opioid  prescriptions. Functional ability and status Nutritional status Physical activity Advanced directives List of other physicians Hospitalizations, surgeries, and ER visits in previous  12 months Vitals Screenings to include cognitive, depression, and falls Referrals and appointments  In addition, I have reviewed and discussed with patient certain preventive protocols, quality metrics, and best practice recommendations. A written personalized care plan for preventive services as well as general preventive health recommendations were provided to patient.   Rojelio LELON Blush, LPN   1/80/7974   After Visit Summary: (MyChart) Due to this being a telephonic visit, the after visit summary with patients personalized plan was offered to patient via MyChart   Notes: Nothing significant to report at this time.

## 2024-02-24 ENCOUNTER — Ambulatory Visit: Payer: Self-pay | Admitting: Family Medicine

## 2024-03-03 ENCOUNTER — Ambulatory Visit
Admission: RE | Admit: 2024-03-03 | Discharge: 2024-03-03 | Disposition: A | Discharge status: Home / Self Care | Source: Ambulatory Visit | Attending: Radiation Oncology | Admitting: Radiation Oncology

## 2024-03-03 DIAGNOSIS — C3431 Malignant neoplasm of lower lobe, right bronchus or lung: Secondary | ICD-10-CM

## 2024-03-03 DIAGNOSIS — R911 Solitary pulmonary nodule: Secondary | ICD-10-CM | POA: Diagnosis not present

## 2024-03-03 DIAGNOSIS — J439 Emphysema, unspecified: Secondary | ICD-10-CM | POA: Diagnosis not present

## 2024-03-03 MED ORDER — IOPAMIDOL (ISOVUE-370) INJECTION 76%
75.0000 mL | Freq: Once | INTRAVENOUS | Status: AC | PRN
  Administered 2024-03-03: 75 mL via INTRAVENOUS

## 2024-03-10 ENCOUNTER — Telehealth: Payer: Self-pay | Admitting: Radiation Oncology

## 2024-03-10 DIAGNOSIS — C3431 Malignant neoplasm of lower lobe, right bronchus or lung: Secondary | ICD-10-CM

## 2024-03-10 NOTE — Telephone Encounter (Signed)
 The patient called back and we reviewed her results together and she is in agreement with the plan for repeat CT in 6 months.

## 2024-03-10 NOTE — Telephone Encounter (Signed)
 I called and left a voicemail for the patient regarding her CT scan. While the area of treatment is showing inflammatory changes that are larger than the nodule was before treatment, it is likely the anticipated architectural changes reflected in this. We will plan to repeat another CT in 6 months, but I asked her to call back if she would like to have a more detailed review of this.

## 2024-03-13 DIAGNOSIS — H401131 Primary open-angle glaucoma, bilateral, mild stage: Secondary | ICD-10-CM | POA: Diagnosis not present

## 2024-03-13 DIAGNOSIS — Z961 Presence of intraocular lens: Secondary | ICD-10-CM | POA: Diagnosis not present

## 2024-03-26 DIAGNOSIS — L821 Other seborrheic keratosis: Secondary | ICD-10-CM | POA: Diagnosis not present

## 2024-03-26 DIAGNOSIS — L820 Inflamed seborrheic keratosis: Secondary | ICD-10-CM | POA: Diagnosis not present

## 2024-03-26 DIAGNOSIS — D225 Melanocytic nevi of trunk: Secondary | ICD-10-CM | POA: Diagnosis not present

## 2024-03-26 DIAGNOSIS — B078 Other viral warts: Secondary | ICD-10-CM | POA: Diagnosis not present

## 2024-03-26 DIAGNOSIS — L814 Other melanin hyperpigmentation: Secondary | ICD-10-CM | POA: Diagnosis not present

## 2024-03-27 ENCOUNTER — Telehealth: Payer: Self-pay

## 2024-03-27 NOTE — Telephone Encounter (Addendum)
 Called pt twice. Phone went directly to vm. VM was left. Meds placed at the front.

## 2024-04-04 ENCOUNTER — Encounter: Payer: Self-pay | Admitting: Pharmacist

## 2024-04-10 ENCOUNTER — Other Ambulatory Visit: Payer: Self-pay | Admitting: Family

## 2024-04-10 ENCOUNTER — Telehealth: Payer: Self-pay

## 2024-04-10 DIAGNOSIS — E119 Type 2 diabetes mellitus without complications: Secondary | ICD-10-CM

## 2024-04-10 NOTE — Telephone Encounter (Signed)
 Copied from CRM [id number]. Topic: General - Other >> Apr 10, 2024 11:46 AM Nessti S wrote: Reason for CRM: patient would like to speak with Tammy from the pharmacy. She misplaced letter that was sent. Call back number [phone]

## 2024-04-15 NOTE — Telephone Encounter (Signed)
 Tried to call patient back. No ans - LM on VM

## 2024-04-16 ENCOUNTER — Other Ambulatory Visit: Payer: Self-pay

## 2024-04-16 ENCOUNTER — Other Ambulatory Visit: Payer: Self-pay | Admitting: Family Medicine

## 2024-04-16 DIAGNOSIS — E119 Type 2 diabetes mellitus without complications: Secondary | ICD-10-CM

## 2024-04-16 DIAGNOSIS — S0300XD Dislocation of jaw, unspecified side, subsequent encounter: Secondary | ICD-10-CM

## 2024-04-16 MED ORDER — REPAGLINIDE 1 MG PO TABS: 2.0000 mg | ORAL_TABLET | Freq: Every day | ORAL | 0 refills | Status: DC

## 2024-04-16 MED ORDER — AMITRIPTYLINE HCL 25 MG PO TABS: 50.0000 mg | ORAL_TABLET | Freq: Every day | ORAL | 0 refills | Status: DC

## 2024-04-16 NOTE — Telephone Encounter (Signed)
 Copied from CRM #[id number]. Topic: Clinical - Medication Refill >> Apr 16, 2024 10:58 AM Mercedes MATSU wrote: Medication:  amitriptyline  (ELAVIL ) 25 MG tablet  Has the patient contacted their pharmacy? Yes (Agent: If no, request that the patient contact the pharmacy for the refill. If patient does not wish to contact the pharmacy document the reason why and proceed with request.) (Agent: If yes, when and what did the pharmacy advise?)  This is the patient's preferred pharmacy:  [id number] [place], [place] - [address] AT [facility] OF [facility] [address] & [facility] [address] [place] [address] Phone: [phone] Fax: [phone]  Is this the correct pharmacy for this prescription? Yes If no, delete pharmacy and type the correct one.   Has the prescription been filled recently? Yes  Is the patient out of the medication? Yes  Has the patient been seen for an appointment in the last year OR does the patient have an upcoming appointment? Yes  Can we respond through MyChart? Yes  Agent: Please be advised that Rx refills may take up to 3 business days. We ask that you follow-up with your pharmacy.

## 2024-04-17 ENCOUNTER — Ambulatory Visit: Payer: Self-pay

## 2024-04-17 NOTE — Telephone Encounter (Signed)
 Patient reports generally not feeling well. Endorses fatigue, chills and temperature of 99. Patient requesting to be seen in the office. Scheduled for tomorrow at 8:40 AM for an acute visit with another provider in office.  FYI Only or Action Required?: FYI only for provider.  Patient was last seen in primary care on 02/12/2024 by Antonio Meth, Jamee SAUNDERS, DO.  Called Nurse Triage reporting Fatigue.  Symptoms began several days ago.  Interventions attempted: Rest, hydration, or home remedies.  Symptoms are: unchanged.  Triage Disposition: See Physician Within 24 Hours  Patient/caregiver understands and will follow disposition?: Yes Copied from CRM #[id number]. Topic: Clinical - Red Word Triage >> Apr 17, 2024  3:52 PM Rea ORN wrote: Red Word that prompted transfer to Nurse Triage: low grade temp 99, fatigue, chills Reason for Disposition  [1] MODERATE weakness (e.g., interferes with work, school, normal activities) AND [2] persists > 3 days  Answer Assessment - Initial Assessment Questions 1. DESCRIPTION: Describe how you are feeling.     Generally not feeling well 2. SEVERITY: How bad is it?  Can you stand and walk?     Feels like not doing anything 3. ONSET: When did these symptoms begin? (e.g., hours, days, weeks, months)     Started on Monday 4. CAUSE: What do you think is causing the weakness or fatigue? (e.g., not drinking enough fluids, medical problem, trouble sleeping)     Not feeling well 5. NEW MEDICINES:  Have you started on any new medicines recently? (e.g., opioid pain medicines, benzodiazepines, muscle relaxants, antidepressants, antihistamines, neuroleptics, beta blockers)     No new medications 6. OTHER SYMPTOMS: Do you have any other symptoms? (e.g., chest pain, fever, cough, SOB, vomiting, diarrhea, bleeding, other areas of pain)     Fever-99, chills  Protocols used: Weakness (Generalized) and Fatigue-A-AH

## 2024-04-17 NOTE — Telephone Encounter (Signed)
 Appt scheduled

## 2024-04-18 ENCOUNTER — Encounter: Payer: Self-pay | Admitting: Family

## 2024-04-18 ENCOUNTER — Ambulatory Visit (INDEPENDENT_AMBULATORY_CARE_PROVIDER_SITE_OTHER): Admitting: Family

## 2024-04-18 VITALS — BP 112/70 | HR 73 | Temp 98.0°F | Resp 18 | Ht 66.0 in | Wt 133.2 lb

## 2024-04-18 DIAGNOSIS — E038 Other specified hypothyroidism: Secondary | ICD-10-CM | POA: Diagnosis not present

## 2024-04-18 DIAGNOSIS — R5383 Other fatigue: Secondary | ICD-10-CM

## 2024-04-18 DIAGNOSIS — R110 Nausea: Secondary | ICD-10-CM

## 2024-04-18 LAB — CBC WITH DIFFERENTIAL/PLATELET
Basophils Absolute: 0.1 K/uL (ref 0.0–0.1)
Basophils Relative: 0.7 % (ref 0.0–3.0)
Eosinophils Absolute: 0.3 K/uL (ref 0.0–0.7)
Eosinophils Relative: 3.5 % (ref 0.0–5.0)
HCT: 33.4 % — ABNORMAL LOW (ref 36.0–46.0)
Hemoglobin: 10.9 g/dL — ABNORMAL LOW (ref 12.0–15.0)
Lymphocytes Relative: 16.5 % (ref 12.0–46.0)
Lymphs Abs: 1.4 K/uL (ref 0.7–4.0)
MCHC: 32.7 g/dL (ref 30.0–36.0)
MCV: 85.2 fl (ref 78.0–100.0)
Monocytes Absolute: 0.8 K/uL (ref 0.1–1.0)
Monocytes Relative: 9.2 % (ref 3.0–12.0)
Neutro Abs: 5.9 K/uL (ref 1.4–7.7)
Neutrophils Relative %: 70.1 % (ref 43.0–77.0)
Platelets: 362 K/uL (ref 150.0–400.0)
RBC: 3.92 Mil/uL (ref 3.87–5.11)
RDW: 14.2 % (ref 11.5–15.5)
WBC: 8.4 K/uL (ref 4.0–10.5)

## 2024-04-18 LAB — AMYLASE: Amylase: 23 U/L — ABNORMAL LOW (ref 27–131)

## 2024-04-18 LAB — TSH: TSH: 1.54 u[IU]/mL (ref 0.35–5.50)

## 2024-04-18 LAB — LIPASE: Lipase: 19 U/L (ref 11.0–59.0)

## 2024-04-18 MED ORDER — ONDANSETRON HCL 4 MG PO TABS: 4.0000 mg | ORAL_TABLET | Freq: Three times a day (TID) | ORAL | 0 refills | Status: DC | PRN

## 2024-04-18 NOTE — Progress Notes (Signed)
 Acute Office Visit  Subjective:     Patient ID: Alyssa Ford, female    DOB: [birth year], 73 y.o.   MRN: [id number]  Chief Complaint  Patient presents with  . Fatigue    Pt states sxs started Tuesday and states having a temp of 99.0 yesterday, no appetite     HPI 73 year old female, patient of Dr. Cruz is in today with c/o fatigues, nausea and lack of appetite x 4 days. She reports losing 6 lbs over the last 2 months unintentionally. She thinks it is related to taking Rybelsus  and feels some better since she has been off x 2 days. She reports her mood as been down this week because she has not ben feeling well. No vomiting, diarrhea or constipation.   Review of Systems  Constitutional:  Positive for malaise/fatigue and weight loss.       No appetite  Respiratory: Negative.    Cardiovascular: Negative.   Gastrointestinal:  Positive for nausea. Negative for vomiting.  Genitourinary: Negative.   Musculoskeletal: Negative.   Neurological: Negative.   Endo/Heme/Allergies: Negative.   Psychiatric/Behavioral: Negative.    All other systems reviewed and are negative.   Past Medical History:  Diagnosis Date  . Anemia   . Anxiety   . Arthritis   . Cataract   . Chronic headaches    pt states that they have improved - 08/06/23  . Coronary artery disease   . Depression   . Diabetes mellitus   . Diverticulosis of colon (without mention of hemorrhage) 2003, 2008   Colonoscopy  . Eczema   . Emphysema of lung (HCC)   . GERD (gastroesophageal reflux disease)   . Glaucoma   . Hiatal hernia 2003   EGD   . History of pneumonia 11/2022  . Hyperlipidemia   . Hypertension   . PONV (postoperative nausea and vomiting)   . Thyroid  nodule     Social History   Socioeconomic History  . Marital status: Married    Spouse name: Not on file  . Number of children: Not on file  . Years of education: Not on file  . Highest education level: Some college, no degree  Occupational  History  . Occupation: retired  Tobacco Use  . Smoking status: Former    Current packs/day: 0.00    Average packs/day: 1.4 packs/day for 49.0 years (68.6 ttl pk-yrs)    Types: Cigarettes    Start date: 07/15/1966    Quit date: 07/16/2015    Years since quitting: 8.7    Passive exposure: Past  . Smokeless tobacco: Never  . Tobacco comments:    Has not vaped in 2 weeks 12/05/2023  Vaping Use  . Vaping status: Some Days  Substance and Sexual Activity  . Alcohol use: Yes    Alcohol/week: 0.0 standard drinks of alcohol    Comment: once weekly; wine and beer  . Drug use: No  . Sexual activity: Not Currently    Partners: Male  Other Topics Concern  . Not on file  Social History Narrative   Exercise--  walk   Social Drivers of Health   Financial Resource Strain: Low Risk  (02/12/2024)   Overall Financial Resource Strain (CARDIA)   . Difficulty of Paying Living Expenses: Not very hard  Food Insecurity: No Food Insecurity (02/12/2024)   Hunger Vital Sign   . Worried About Programme researcher, broadcasting/film/video in the Last Year: Never true   . Ran Out of Food in the  Last Year: Never true  Transportation Needs: No Transportation Needs (02/12/2024)   PRAPARE - Transportation   . Lack of Transportation (Medical): No   . Lack of Transportation (Non-Medical): No  Physical Activity: Insufficiently Active (02/12/2024)   Exercise Vital Sign   . Days of Exercise per Week: 4 days   . Minutes of Exercise per Session: 30 min  Stress: Stress Concern Present (02/12/2024)   [employer] of Occupational Health - Occupational Stress Questionnaire   . Feeling of Stress: Rather much  Social Connections: Moderately Isolated (02/12/2024)   Social Connection and Isolation Panel   . Frequency of Communication with Friends and Family: More than three times a week   . Frequency of Social Gatherings with Friends and Family: Three times a week   . Attends Religious Services: Patient declined   . Active Member of Clubs or  Organizations: No   . Attends Banker Meetings: Not on file   . Marital Status: Married  Catering manager Violence: Not At Risk (02/12/2024)   Humiliation, Afraid, Rape, and Kick questionnaire   . Fear of Current or Ex-Partner: No   . Emotionally Abused: No   . Physically Abused: No   . Sexually Abused: No    Past Surgical History:  Procedure Laterality Date  . BRONCHIAL BIOPSY  08/07/2023   Procedure: BRONCHIAL BIOPSIES;  Surgeon: Shelah Lamar RAMAN, MD;  Location: [facility] ENDOSCOPY;  Service: Pulmonary;;  . BRONCHIAL BRUSHINGS  08/07/2023   Procedure: BRONCHIAL BRUSHINGS;  Surgeon: Shelah Lamar RAMAN, MD;  Location: [facility] ENDOSCOPY;  Service: Pulmonary;;  . BRONCHIAL NEEDLE ASPIRATION BIOPSY  08/07/2023   Procedure: BRONCHIAL NEEDLE ASPIRATION BIOPSIES;  Surgeon: Shelah Lamar RAMAN, MD;  Location: [facility] ENDOSCOPY;  Service: Pulmonary;;  . CARPAL TUNNEL RELEASE  2006  . CARPAL TUNNEL RELEASE  12/24/2007  . CATARACT EXTRACTION, BILATERAL     [place]---  sept and oct 2018  . CERVICAL FUSION  08/2006  . CHOLECYSTECTOMY    . COSMETIC SURGERY  December 09 2018  . EYE SURGERY  catracts  . FASCIECTOMY Left 08/01/2018   Procedure: FASCIECTOMY LEFT RING;  Surgeon: Murrell Kuba, MD;  Location: [facility];  Service: Orthopedics;  Laterality: Left;  UPPER ARM  . FIDUCIAL MARKER PLACEMENT  08/07/2023   Procedure: FIDUCIAL MARKER PLACEMENT;  Surgeon: Shelah Lamar RAMAN, MD;  Location: [facility] ENDOSCOPY;  Service: Pulmonary;;  . SPINE SURGERY  neck dic replaced  . TRIGGER FINGER RELEASE Left 08/01/2018   Procedure: RELEASE TRIGGER FINGER/A-1 PULLEY LEFTRING;  Surgeon: Murrell Kuba, MD;  Location: [facility];  Service: Orthopedics;  Laterality: Left;  . TUBAL LIGATION      Family History  Problem Relation Age of Onset  . Angioedema Mother   . Dementia Mother   . Breast cancer Mother 37  . Kidney failure Mother 33       died from kidney failure from abx given to her for pneumonia  .  Hyperlipidemia Mother   . Hypertension Mother   . Diabetes Mother   . Cancer Mother 41       breast  . Parkinsonism Father   . Diabetes Father 78  . Hypertension Sister   . Diabetes Brother   . Hypertension Brother   . Heart attack Brother   . Heart disease Cousin 90       mi  . Colon cancer Neg Hx   . Esophageal cancer Neg Hx   . Stomach cancer Neg Hx  Allergies  Allergen Reactions  . Codeine Nausea Only    Severe nausea  . Augmentin  [Amoxicillin -Pot Clavulanate]     Severe nausea and vomitting    Current Outpatient Medications on File Prior to Visit  Medication Sig Dispense Refill  . amitriptyline  (ELAVIL ) 25 MG tablet Take 2 tablets (50 mg total) by mouth at bedtime. 180 tablet 0  . aspirin  EC 81 MG tablet Take 81 mg by mouth daily.    SABRA BIOTIN PO Take 1 tablet by mouth daily.     . Coenzyme Q10 (CO Q 10 PO) Take 1 capsule by mouth daily.    . fenofibrate  160 MG tablet Take 1 tablet (160 mg total) by mouth daily. 90 tablet 1  . Ferrous Sulfate (IRON SUPPLEMENT PO) Take 1 tablet by mouth daily. 65mg     . ipratropium (ATROVENT ) 0.06 % nasal spray Place 2 sprays into both nostrils 3 (three) times daily as needed for rhinitis. 15 mL 5  . latanoprost  (XALATAN ) 0.005 % ophthalmic solution Place 1 drop into both eyes at bedtime.     . lisinopril  (ZESTRIL ) 5 MG tablet Take 1 tablet (5 mg total) by mouth daily. 90 tablet 1  . metFORMIN  (GLUCOPHAGE -XR) 500 MG 24 hr tablet TAKE 4 TABLETS BY MOUTH EVERY DAY 360 tablet 1  . montelukast  (SINGULAIR ) 10 MG tablet Take 1 tablet (10 mg total) by mouth at bedtime. 90 tablet 3  . Naproxen Sodium (ALEVE PO) Take 2 tablets by mouth daily as needed (pain).    . NONFORMULARY OR COMPOUNDED ITEM Take 1 tablet by mouth at bedtime. Cbd /delta 9 1 po qhs    . omeprazole  (PRILOSEC) 20 MG capsule TAKE 1 CAPSULE BY MOUTH DAILY 100 capsule 2  . repaglinide  (PRANDIN ) 1 MG tablet Take 2 tablets (2 mg total) by mouth daily. Before evening meal. 180  tablet 0  . rosuvastatin  (CRESTOR ) 40 MG tablet TAKE 1 TABLET(40 MG) BY MOUTH AT BEDTIME 90 tablet 1  . Semaglutide  (RYBELSUS ) 7 MG TABS Take 1 tablet (7 mg total) by mouth daily.     No current facility-administered medications on file prior to visit.    BP 112/70 (BP Location: Left Arm, Patient Position: Sitting, Cuff Size: Normal)   Pulse 73   Temp 98 F (36.7 C) (Oral)   Resp 18   Ht 5' 6 (1.676 m)   Wt 133 lb 3.2 oz (60.4 kg)   SpO2 97%   BMI 21.50 kg/m chart     Objective:    BP 112/70 (BP Location: Left Arm, Patient Position: Sitting, Cuff Size: Normal)   Pulse 73   Temp 98 F (36.7 C) (Oral)   Resp 18   Ht 5' 6 (1.676 m)   Wt 133 lb 3.2 oz (60.4 kg)   SpO2 97%   BMI 21.50 kg/m    Physical Exam Vitals and nursing note reviewed.  Constitutional:      Appearance: Normal appearance. She is normal weight.  HENT:     Right Ear: Tympanic membrane, ear canal and external ear normal.     Left Ear: Tympanic membrane, ear canal and external ear normal.     Mouth/Throat:     Mouth: Mucous membranes are moist.  Cardiovascular:     Rate and Rhythm: Normal rate and regular rhythm.  Pulmonary:     Effort: Pulmonary effort is normal.     Breath sounds: Normal breath sounds.  Abdominal:     General: Abdomen is flat. Bowel sounds are  normal.     Palpations: Abdomen is soft.  Musculoskeletal:        General: Normal range of motion.     Cervical back: Normal range of motion.  Skin:    General: Skin is warm and dry.  Neurological:     General: No focal deficit present.     Mental Status: She is alert and oriented to person, place, and time. Mental status is at baseline.  Psychiatric:        Mood and Affect: Mood normal.        Behavior: Behavior normal.        Thought Content: Thought content normal.     No results found for any visits on 04/18/24.      Assessment & Plan:   Problem List Items Addressed This Visit     Nausea   Relevant Orders   Basic  metabolic panel   CBC w/Diff   Amylase   Lipase   TSH   Hypothyroidism - Primary   Relevant Orders   Basic metabolic panel   CBC w/Diff   Amylase   Lipase   TSH   Fatigue   Relevant Orders   Basic metabolic panel   CBC w/Diff   Amylase   Lipase   TSH    Meds ordered this encounter  Medications  . ondansetron  (ZOFRAN ) 4 MG tablet    Sig: Take 1 tablet (4 mg total) by mouth every 8 (eight) hours as needed for nausea or vomiting.    Dispense:  20 tablet    Refill:  0   Call the office if symptoms worsen or persist. Recheck pending labs, in 1 week for mychart follow-up update to see if holding the Rybelsus  has helped and sooner as needed.  No follow-ups on file.  Niasia Lanphear B Adair Lauderback, FNP

## 2024-04-18 NOTE — Patient Instructions (Signed)
 Hold Rybelsus  for 1 week . Let us  know if you feel better.

## 2024-04-19 ENCOUNTER — Ambulatory Visit: Payer: Self-pay | Admitting: Family

## 2024-04-19 LAB — BASIC METABOLIC PANEL WITH GFR
BUN/Creatinine Ratio: 17 (calc) (ref 6–22)
BUN: 18 mg/dL (ref 7–25)
CO2: 27 mmol/L (ref 20–32)
Calcium: 8.7 mg/dL (ref 8.6–10.4)
Chloride: 99 mmol/L (ref 98–110)
Creat: 1.07 mg/dL — ABNORMAL HIGH (ref 0.60–1.00)
Glucose, Bld: 138 mg/dL — ABNORMAL HIGH (ref 65–99)
Potassium: 4 mmol/L (ref 3.5–5.3)
Sodium: 138 mmol/L (ref 135–146)
eGFR: 55 mL/min/1.73m2 — ABNORMAL LOW (ref >=60)

## 2024-04-24 NOTE — Telephone Encounter (Signed)
 Spoke with patient about Rybelsus  cost for 2026. She reports that she had not felt well the last few weeks and wondered if it was Rybelsus . She had labs labs week - Padonda Webb checked for possible signs of pancreatitis - amylase and lipase were not elevated. Patient is holding Rybelsus  currently.   She will see Dr Cruz 11/4. Could consider trial of either Ozempic 0.25mg  weekly or Mounjaro 2.5mg  weekly.   Reviewed her potential cost for GLP1s if she keep current [facility] plan. Could have $440 deductible and coinsurance for GLP1 of 15% of medication cost or around $150 / month.  Patient is looking at other Medicare plans and costs.

## 2024-04-29 ENCOUNTER — Ambulatory Visit: Admitting: Family Medicine

## 2024-04-29 ENCOUNTER — Encounter: Payer: Self-pay | Admitting: Family Medicine

## 2024-04-29 VITALS — BP 100/78 | HR 70 | Temp 97.9°F | Resp 18 | Ht 66.0 in | Wt 135.8 lb

## 2024-04-29 DIAGNOSIS — Z7984 Long term (current) use of oral hypoglycemic drugs: Secondary | ICD-10-CM

## 2024-04-29 DIAGNOSIS — S0300XD Dislocation of jaw, unspecified side, subsequent encounter: Secondary | ICD-10-CM | POA: Diagnosis not present

## 2024-04-29 DIAGNOSIS — R911 Solitary pulmonary nodule: Secondary | ICD-10-CM

## 2024-04-29 DIAGNOSIS — E118 Type 2 diabetes mellitus with unspecified complications: Secondary | ICD-10-CM

## 2024-04-29 DIAGNOSIS — I272 Pulmonary hypertension, unspecified: Secondary | ICD-10-CM

## 2024-04-29 DIAGNOSIS — E785 Hyperlipidemia, unspecified: Secondary | ICD-10-CM

## 2024-04-29 DIAGNOSIS — E039 Hypothyroidism, unspecified: Secondary | ICD-10-CM | POA: Diagnosis not present

## 2024-04-29 DIAGNOSIS — E1165 Type 2 diabetes mellitus with hyperglycemia: Secondary | ICD-10-CM

## 2024-04-29 DIAGNOSIS — I10 Essential (primary) hypertension: Secondary | ICD-10-CM | POA: Diagnosis not present

## 2024-04-29 DIAGNOSIS — E1169 Type 2 diabetes mellitus with other specified complication: Secondary | ICD-10-CM

## 2024-04-29 MED ORDER — OZEMPIC (0.25 OR 0.5 MG/DOSE) 2 MG/3ML ~~LOC~~ SOPN: PEN_INJECTOR | SUBCUTANEOUS | 2 refills | Status: DC

## 2024-04-29 MED ORDER — AMITRIPTYLINE HCL 25 MG PO TABS: 50.0000 mg | ORAL_TABLET | Freq: Every day | ORAL | 2 refills | Status: DC

## 2024-04-29 NOTE — Assessment & Plan Note (Signed)
 Change rybelsus  to ozempic F/u 3 months to recheck labs Instructed pt on how to administer the ozempic and pt administered successfully

## 2024-04-29 NOTE — Assessment & Plan Note (Signed)
 Well controlled, no changes to meds. Encouraged heart healthy diet such as the DASH diet and exercise as tolerated.

## 2024-04-29 NOTE — Assessment & Plan Note (Signed)
On no meds Check labs  

## 2024-04-29 NOTE — Progress Notes (Addendum)
 r  Subjective:    Patient ID: Alyssa Ford, female    DOB: 04-07-1951, 73 y.o.   MRN: [id number]  Chief Complaint  Patient presents with   Medication Management    Pt states wanting to discuss Rybelsus  and states feeling yucky and nauseous. Pt states wanting to discuss Mounjaro/Ozempic.      HPI Patient is in today for f/u.  Discussed the use of AI scribe software for clinical note transcription with the patient, who gave verbal consent to proceed.  History of Present Illness Alyssa Ford is a 73 year old female who presents with nausea and medication side effects.  She has been experiencing persistent nausea since starting Rybelsus . Some days are better than others, but overall she feels 'awful'. A couple of weeks ago, she was very sick with fever and significant nausea, leading to a weight loss of five pounds, bringing her weight down to 131 pounds. During that time, she was unable to eat and relied on Boost for nutrition.  She was prescribed Zofran  for nausea during a previous visit, which she finds makes her sleepy. She takes two tablets of 4 mg each, which helps her sleep. Despite the nausea, she is not experiencing constipation, as Senokot has been effective in managing this symptom.  She is currently using Atrovent  nasal spray (ipratropium) twice a day, which she previously used before meals. She does not find Flonase  helpful for her symptoms. No swelling in her legs, noting that her legs are 'skinny'. She has not taken her Rybelsus  today.    Past Medical History:  Diagnosis Date   Anemia    Anxiety    Arthritis    Cataract    Chronic headaches    pt states that they have improved - 08/06/23   Coronary artery disease    Depression    Diabetes mellitus    Diverticulosis of colon (without mention of hemorrhage) 2003, 2008   Colonoscopy   Eczema    Emphysema of lung (HCC)    GERD (gastroesophageal reflux disease)    Glaucoma    Hiatal hernia 2003   EGD    History  of pneumonia 11/2022   Hyperlipidemia    Hypertension    PONV (postoperative nausea and vomiting)    Thyroid  nodule     Past Surgical History:  Procedure Laterality Date   BRONCHIAL BIOPSY  08/07/2023   Procedure: BRONCHIAL BIOPSIES;  Surgeon: Shelah Lamar RAMAN, MD;  Location: [facility];  Service: Pulmonary;;   BRONCHIAL BRUSHINGS  08/07/2023   Procedure: BRONCHIAL BRUSHINGS;  Surgeon: Shelah Lamar RAMAN, MD;  Location: [facility] ENDOSCOPY;  Service: Pulmonary;;   BRONCHIAL NEEDLE ASPIRATION BIOPSY  08/07/2023   Procedure: BRONCHIAL NEEDLE ASPIRATION BIOPSIES;  Surgeon: Shelah Lamar RAMAN, MD;  Location: [facility];  Service: Pulmonary;;   CARPAL TUNNEL RELEASE  2006   CARPAL TUNNEL RELEASE  12/24/2007   CATARACT EXTRACTION, BILATERAL     [facility]---  sept and oct 2018   CERVICAL FUSION  08/2006   CHOLECYSTECTOMY     COSMETIC SURGERY  December 09 2018   EYE SURGERY  catracts   FASCIECTOMY Left 08/01/2018   Procedure: FASCIECTOMY LEFT RING;  Surgeon: Murrell Kuba, MD;  Location: [facility];  Service: Orthopedics;  Laterality: Left;  UPPER ARM   FIDUCIAL MARKER PLACEMENT  08/07/2023   Procedure: FIDUCIAL MARKER PLACEMENT;  Surgeon: Shelah Lamar RAMAN, MD;  Location: [facility] ENDOSCOPY;  Service: Pulmonary;;   SPINE SURGERY  neck dic replaced  TRIGGER FINGER RELEASE Left 08/01/2018   Procedure: RELEASE TRIGGER FINGER/A-1 PULLEY LEFTRING;  Surgeon: Murrell Kuba, MD;  Location: [facility];  Service: Orthopedics;  Laterality: Left;   TUBAL LIGATION      Family History  Problem Relation Age of Onset   Angioedema Mother    Dementia Mother    Breast cancer Mother 79   Kidney failure Mother 75       died from kidney failure from abx given to her for pneumonia   Hyperlipidemia Mother    Hypertension Mother    Diabetes Mother    Cancer Mother 63       breast   Parkinsonism Father    Diabetes Father 67   Hypertension Sister    Diabetes Brother    Hypertension Brother    Heart  attack Brother    Heart disease Cousin 67       mi   Colon cancer Neg Hx    Esophageal cancer Neg Hx    Stomach cancer Neg Hx     Social History   Socioeconomic History   Marital status: Married    Spouse name: Not on file   Number of children: Not on file   Years of education: Not on file   Highest education level: 12th grade  Occupational History   Occupation: retired  Tobacco Use   Smoking status: Former    Current packs/day: 0.00    Average packs/day: 1.4 packs/day for 49.0 years (68.6 ttl pk-yrs)    Types: Cigarettes    Start date: 07/15/1966    Quit date: 07/16/2015    Years since quitting: 8.8    Passive exposure: Past   Smokeless tobacco: Never   Tobacco comments:    Has not vaped in 2 weeks 12/05/2023  Vaping Use   Vaping status: Some Days  Substance and Sexual Activity   Alcohol use: Yes    Alcohol/week: 0.0 standard drinks of alcohol    Comment: once weekly; wine and beer   Drug use: No   Sexual activity: Not Currently    Partners: Male  Other Topics Concern   Not on file  Social History Narrative   Exercise--  walk   Social Drivers of Health   Financial Resource Strain: Low Risk  (04/25/2024)   Overall Financial Resource Strain (CARDIA)    Difficulty of Paying Living Expenses: Not very hard  Food Insecurity: No Food Insecurity (04/25/2024)   Hunger Vital Sign    Worried About Running Out of Food in the Last Year: Never true    Ran Out of Food in the Last Year: Never true  Transportation Needs: No Transportation Needs (04/25/2024)   PRAPARE - Administrator, Civil Service (Medical): No    Lack of Transportation (Non-Medical): No  Physical Activity: Insufficiently Active (02/12/2024)   Exercise Vital Sign    Days of Exercise per Week: 4 days    Minutes of Exercise per Session: 30 min  Stress: Stress Concern Present (02/12/2024)   [employer] of Occupational Health - Occupational Stress Questionnaire    Feeling of Stress: Rather much   Social Connections: Moderately Isolated (04/25/2024)   Social Connection and Isolation Panel    Frequency of Communication with Friends and Family: More than three times a week    Frequency of Social Gatherings with Friends and Family: More than three times a week    Attends Religious Services: Patient declined    Database Administrator or Organizations: No  Attends Banker Meetings: Not on file    Marital Status: Married  Intimate Partner Violence: Not At Risk (02/12/2024)   Humiliation, Afraid, Rape, and Kick questionnaire    Fear of Current or Ex-Partner: No    Emotionally Abused: No    Physically Abused: No    Sexually Abused: No    Outpatient Medications Prior to Visit  Medication Sig Dispense Refill   aspirin  EC 81 MG tablet Take 81 mg by mouth daily.     BIOTIN PO Take 1 tablet by mouth daily.      Coenzyme Q10 (CO Q 10 PO) Take 1 capsule by mouth daily.     fenofibrate  160 MG tablet Take 1 tablet (160 mg total) by mouth daily. 90 tablet 1   Ferrous Sulfate (IRON SUPPLEMENT PO) Take 1 tablet by mouth daily. 65mg      ipratropium (ATROVENT ) 0.06 % nasal spray Place 2 sprays into both nostrils 3 (three) times daily as needed for rhinitis. 15 mL 5   ipratropium (ATROVENT ) 0.06 % nasal spray Place 2 sprays into both nostrils 3 (three) times daily.     latanoprost  (XALATAN ) 0.005 % ophthalmic solution Place 1 drop into both eyes at bedtime.      lisinopril  (ZESTRIL ) 5 MG tablet Take 1 tablet (5 mg total) by mouth daily. 90 tablet 1   metFORMIN  (GLUCOPHAGE -XR) 500 MG 24 hr tablet TAKE 4 TABLETS BY MOUTH EVERY DAY 360 tablet 1   montelukast  (SINGULAIR ) 10 MG tablet Take 1 tablet (10 mg total) by mouth at bedtime. 90 tablet 3   Naproxen Sodium (ALEVE PO) Take 2 tablets by mouth daily as needed (pain).     NONFORMULARY OR COMPOUNDED ITEM Take 1 tablet by mouth at bedtime. Cbd /delta 9 1 po qhs     omeprazole  (PRILOSEC) 20 MG capsule TAKE 1 CAPSULE BY MOUTH DAILY 100  capsule 2   ondansetron  (ZOFRAN ) 4 MG tablet Take 1 tablet (4 mg total) by mouth every 8 (eight) hours as needed for nausea or vomiting. 20 tablet 0   repaglinide  (PRANDIN ) 1 MG tablet Take 2 tablets (2 mg total) by mouth daily. Before evening meal. 180 tablet 0   rosuvastatin  (CRESTOR ) 40 MG tablet TAKE 1 TABLET(40 MG) BY MOUTH AT BEDTIME 90 tablet 1   Semaglutide  (RYBELSUS ) 7 MG TABS Take 1 tablet (7 mg total) by mouth daily.     amitriptyline  (ELAVIL ) 25 MG tablet Take 2 tablets (50 mg total) by mouth at bedtime. 180 tablet 0   No facility-administered medications prior to visit.    Allergies  Allergen Reactions   Codeine Nausea Only    Severe nausea   Augmentin  [Amoxicillin -Pot Clavulanate]     Severe nausea and vomitting    Review of Systems  Constitutional:  Negative for fever and malaise/fatigue.  HENT:  Negative for congestion.   Eyes:  Negative for blurred vision.  Respiratory:  Negative for shortness of breath.   Cardiovascular:  Negative for chest pain, palpitations and leg swelling.  Gastrointestinal:  Negative for abdominal pain, blood in stool and nausea.  Genitourinary:  Negative for dysuria and frequency.  Musculoskeletal:  Negative for falls.  Skin:  Negative for rash.  Neurological:  Negative for dizziness, loss of consciousness and headaches.  Endo/Heme/Allergies:  Negative for environmental allergies.  Psychiatric/Behavioral:  Negative for depression. The patient is not nervous/anxious.        Objective:    Physical Exam Vitals and nursing note reviewed.  Constitutional:  General: She is not in acute distress.    Appearance: Normal appearance. She is well-developed.  HENT:     Head: Normocephalic and atraumatic.  Eyes:     General: No scleral icterus.       Right eye: No discharge.        Left eye: No discharge.  Cardiovascular:     Rate and Rhythm: Normal rate and regular rhythm.     Heart sounds: No murmur heard. Pulmonary:     Effort:  Pulmonary effort is normal. No respiratory distress.     Breath sounds: Normal breath sounds.  Musculoskeletal:        General: Normal range of motion.     Cervical back: Normal range of motion and neck supple.     Right lower leg: No edema.     Left lower leg: No edema.  Skin:    General: Skin is warm and dry.  Neurological:     Mental Status: She is alert and oriented to person, place, and time.  Psychiatric:        Mood and Affect: Mood normal.        Behavior: Behavior normal.        Thought Content: Thought content normal.        Judgment: Judgment normal.    Diabetic Foot Exam - Simple   Simple Foot Form Diabetic Foot exam was performed with the following findings: Yes 04/29/2024 12:00 PM  Visual Inspection No deformities, no ulcerations, no other skin breakdown bilaterally: Yes Sensation Testing Intact to touch and monofilament testing bilaterally: Yes Pulse Check Posterior Tibialis and Dorsalis pulse intact bilaterally: Yes Comments      BP 100/78 (BP Location: Left Arm, Patient Position: Sitting, Cuff Size: Normal)   Pulse 70   Temp 97.9 F (36.6 C) (Oral)   Resp 18   Ht 5' 6 (1.676 m)   Wt 135 lb 12.8 oz (61.6 kg)   SpO2 98%   BMI 21.92 kg/m  Wt Readings from Last 3 Encounters:  04/29/24 135 lb 12.8 oz (61.6 kg)  04/18/24 133 lb 3.2 oz (60.4 kg)  02/12/24 138 lb 9.6 oz (62.9 kg)    Diabetic Foot Exam - Simple   Simple Foot Form Diabetic Foot exam was performed with the following findings: Yes 04/29/2024 12:00 PM  Visual Inspection No deformities, no ulcerations, no other skin breakdown bilaterally: Yes Sensation Testing Intact to touch and monofilament testing bilaterally: Yes Pulse Check Posterior Tibialis and Dorsalis pulse intact bilaterally: Yes Comments    Lab Results  Component Value Date   WBC 8.4 04/18/2024   HGB 10.9 (L) 04/18/2024   HCT 33.4 (L) 04/18/2024   PLT 362.0 04/18/2024   GLUCOSE 138 (H) 04/18/2024   CHOL 128 02/12/2024    TRIG 169.0 (H) 02/12/2024   HDL 41.60 02/12/2024   LDLDIRECT 87.0 06/07/2020   LDLCALC 53 02/12/2024   ALT 16 02/12/2024   AST 17 02/12/2024   NA 138 04/18/2024   K 4.0 04/18/2024   CL 99 04/18/2024   CREATININE 1.07 (H) 04/18/2024   BUN 18 04/18/2024   CO2 27 04/18/2024   TSH 1.54 04/18/2024   INR 1.3 (H) 11/26/2022   HGBA1C 6.8 (H) 02/12/2024   MICROALBUR 0.7 11/23/2023    Lab Results  Component Value Date   TSH 1.54 04/18/2024   Lab Results  Component Value Date   WBC 8.4 04/18/2024   HGB 10.9 (L) 04/18/2024   HCT 33.4 (L) 04/18/2024  MCV 85.2 04/18/2024   PLT 362.0 04/18/2024   Lab Results  Component Value Date   NA 138 04/18/2024   K 4.0 04/18/2024   CO2 27 04/18/2024   GLUCOSE 138 (H) 04/18/2024   BUN 18 04/18/2024   CREATININE 1.07 (H) 04/18/2024   BILITOT 0.3 02/12/2024   ALKPHOS 39 02/12/2024   AST 17 02/12/2024   ALT 16 02/12/2024   PROT 6.7 02/12/2024   ALBUMIN 4.3 02/12/2024   CALCIUM  8.7 04/18/2024   ANIONGAP 14 12/29/2022   EGFR 55 (L) 04/18/2024   GFR 52.57 (L) 02/12/2024   Lab Results  Component Value Date   CHOL 128 02/12/2024   Lab Results  Component Value Date   HDL 41.60 02/12/2024   Lab Results  Component Value Date   LDLCALC 53 02/12/2024   Lab Results  Component Value Date   TRIG 169.0 (H) 02/12/2024   Lab Results  Component Value Date   CHOLHDL 3 02/12/2024   Lab Results  Component Value Date   HGBA1C 6.8 (H) 02/12/2024       Assessment & Plan:  Hyperlipidemia associated with type 2 diabetes mellitus (HCC) Assessment & Plan: Tolerating statin, encouraged heart healthy diet, avoid trans fats, minimize simple carbs and saturated fats. Increase exercise as tolerated    Dislocation of temporomandibular joint, subsequent encounter -     Amitriptyline  HCl; Take 2 tablets (50 mg total) by mouth at bedtime.  Dispense: 180 tablet; Refill: 2  Essential hypertension Assessment & Plan: Well controlled, no changes to  meds. Encouraged heart healthy diet such as the DASH diet and exercise as tolerated.     Hypothyroidism, unspecified type Assessment & Plan: On no meds Check labs    Controlled type 2 diabetes mellitus with complication, without long-term current use of insulin  (HCC) -     Ozempic (0.25 or 0.5 MG/DOSE); 0.25 mg Sq weekly x 4 weeks then increase to 0.5 mg weekly  Dispense: 3 mL; Refill: 2  Controlled type 2 diabetes mellitus with hyperglycemia, without long-term current use of insulin  (HCC) Assessment & Plan: Change rybelsus  to ozempic F/u 3 months to recheck labs Instructed pt on how to administer the ozempic and pt administered successfully   Pulmonary HTN (HCC) Assessment & Plan: Per pulm     Pulmonary nodule Assessment & Plan: Per pulm    Assessment and Plan Assessment & Plan Type 2 diabetes mellitus with other specified and unspecified complications   She experiences nausea, a side effect of Rybelsus , causing significant weight loss and difficulty eating, necessitating nutritional supplementation with Boost. She wishes to change medication due to these side effects. Ozempic is chosen as an alternative for its potential to cause fewer gastrointestinal side effects and offer cardiovascular benefits. She is informed about administration, rotation of injection sites, and proper disposal of needles. Start Ozempic with instructions on administration and rotation of injection sites. Educate on proper disposal of needles using a soda bottle or milk jug for sharps. Schedule a follow-up appointment in February to assess the effectiveness of Ozempic.  Nausea   Nausea persists and is managed with Zofran , which also aids sleep. The current dose is 4 mg, taken as needed. Continue Zofran  4 mg as needed for nausea.  Essential hypertension   Blood pressure is 100/78 mmHg, within an acceptable range.  Viviane Semidey R Lowne  Chase, DO

## 2024-04-30 ENCOUNTER — Encounter: Payer: Self-pay | Admitting: Internal Medicine

## 2024-05-05 ENCOUNTER — Other Ambulatory Visit: Payer: Self-pay | Admitting: Medical Genetics

## 2024-05-05 DIAGNOSIS — Z006 Encounter for examination for normal comparison and control in clinical research program: Secondary | ICD-10-CM

## 2024-05-05 NOTE — Assessment & Plan Note (Signed)
 Tolerating statin, encouraged heart healthy diet, avoid trans fats, minimize simple carbs and saturated fats. Increase exercise as tolerated

## 2024-05-05 NOTE — Assessment & Plan Note (Signed)
Per pulm 

## 2024-05-13 ENCOUNTER — Encounter: Payer: Self-pay | Admitting: Gastroenterology

## 2024-05-14 ENCOUNTER — Encounter: Payer: Self-pay | Admitting: Family Medicine

## 2024-05-14 ENCOUNTER — Ambulatory Visit (HOSPITAL_BASED_OUTPATIENT_CLINIC_OR_DEPARTMENT_OTHER)
Admission: RE | Admit: 2024-05-14 | Discharge: 2024-05-14 | Disposition: A | Discharge status: Home / Self Care | Source: Ambulatory Visit | Attending: Family Medicine | Admitting: Family Medicine

## 2024-05-14 ENCOUNTER — Ambulatory Visit: Admitting: Family Medicine

## 2024-05-14 ENCOUNTER — Ambulatory Visit: Payer: Self-pay

## 2024-05-14 VITALS — BP 120/68 | HR 85 | Temp 98.2°F | Ht 66.0 in | Wt 131.2 lb

## 2024-05-14 DIAGNOSIS — B029 Zoster without complications: Secondary | ICD-10-CM

## 2024-05-14 DIAGNOSIS — D649 Anemia, unspecified: Secondary | ICD-10-CM | POA: Diagnosis not present

## 2024-05-14 DIAGNOSIS — Z85118 Personal history of other malignant neoplasm of bronchus and lung: Secondary | ICD-10-CM | POA: Insufficient documentation

## 2024-05-14 DIAGNOSIS — J189 Pneumonia, unspecified organism: Secondary | ICD-10-CM

## 2024-05-14 MED ORDER — VALACYCLOVIR HCL 1 G PO TABS: 1000.0000 mg | ORAL_TABLET | Freq: Three times a day (TID) | ORAL | 0 refills | Status: DC

## 2024-05-14 MED ORDER — AZITHROMYCIN 250 MG PO TABS: ORAL_TABLET | ORAL | 0 refills | Status: AC

## 2024-05-14 NOTE — Telephone Encounter (Signed)
 FYI Only or Action Required?: FYI only for provider: appointment scheduled on 05/14/24.  Patient was last seen in primary care on 04/29/2024 by Antonio Meth, Jamee SAUNDERS, DO.  Called Nurse Triage reporting Rash.  Symptoms began several days ago.  Interventions attempted: OTC medications: cortisone cream.  Symptoms are: red, flat pinpoint (about 15-20) rash to lower back and gradually worsening spreading to right side of back; itchy. Right mid back, bra-line moderate pain.  Triage Disposition: See PCP When Office is Open (Within 3 Days)  Patient/caregiver understands and will follow disposition?: Yes          Copied from CRM #[id number]. Topic: Clinical - Red Word Triage >> May 14, 2024  8:03 AM Pinkey ORN wrote: Red Word that prompted transfer to Nurse Triage: Rash >> May 14, 2024  8:03 AM Pinkey ORN wrote: Patient states her back broke out in a rash. Reason for Disposition  Localized rash present > 7 days  Answer Assessment - Initial Assessment Questions 1. APPEARANCE of RASH: What does the rash look like? (e.g., blisters, dry flaky skin, red spots, redness, sores)     Red flat dots. Denies blisters or hives.  2. LOCATION: Where is the rash located?      Lower back.  3. NUMBER: How many spots are there?      15-20.  4. SIZE: How big are the spots? (e.g., inches, cm; or compare to size of pinhead, tip of pen, eraser, pea)      A little larger than a pinhead.  5. ONSET: When did the rash start?      Friday.  6. ITCHING: Does the rash itch? If Yes, ask: How bad is the itch?  (Scale 0-10; or none, mild, moderate, severe)     Yes. Moderate.  7. PAIN: Does the rash hurt? If Yes, ask: How bad is the pain?  (Scale 0-10; or none, mild, moderate, severe)     No. She states the right side of her bra-line is painful (moderate, worse when taking a deep breath).  8. OTHER SYMPTOMS: Do you have any other symptoms? (e.g., fever)     Loss of appetite, nausea (on  Ozempic ). Chills. Denies fever, facial or tongue swelling, difficulty breathing, history of shingles.  Patient states new medication, Ozempic  was started about 2 weeks ago. No other new medications. She states she has been taking Zofran  for nausea but has been on the medication about a month.  Protocols used: Rash or Redness - Localized-A-AH

## 2024-05-14 NOTE — Progress Notes (Addendum)
 [facility] [address], [address] [place], [place] [address] [phone]  Date:  05/14/2024   Name:  Alyssa Ford   DOB:  1951-03-06   MRN:  [id number]  PCP:  Antonio Cyndee Jamee JONELLE, DO    Chief Complaint: Rash (Itchy rash on lower back and spreading to R side onset 05/12/2024/And I just feel awful and I have no appetite. I started to feel bad the Friday before )   History of Present Illness:  Alyssa Ford is a 73 y.o. very pleasant female patient who presents with the following:  Patient today with concern of a rash on her trunk.  She is a primary patient of my partner Dr. Antonio Cyndee History of diabetes, hypertension/pulmonary hypertension, hyperlipidemia, hypothyroidism She was diagnosed with lung cancer in February of this year Lab Results  Component Value Date   HGBA1C 6.8 (H) 02/12/2024   Discussed the use of AI scribe software for clinical note transcription with the patient, who gave verbal consent to proceed.  History of Present Illness Alyssa Ford is a 73 year old female with lung cancer who presents with a rash on her lower back and general malaise.  She has a rash on her lower back since Monday, which has spread to her right abdomen. The rash is itchy but not painful. No recent gardening or exposure to other potential allergens. She is concerned about the potential for the rash to be contagious, especially around her young great-grandchildren, aged four and two, who may not be fully immunized against chickenpox.  She started feeling unwell last Friday, experiencing fatigue and a lack of energy. She reports pain in her right side only when taking deep breaths, which began around the same time as the rash. She has been feeling generally unwell for about a month-she feels tired. She reports a lack of appetite and fatigue and notes that she underwent radiation treatment for lung cancer earlier this  year.  She recently started on Ozempic  two weeks ago for diabetes management, taking a dose of 0.25 mg, and has taken her last shot yesterday. She feels like the Ozempic  is making her feel tired and generally worse, she would like to stop taking it.  She plans to reach out to her PCP  She has a history of anemia for the past ten years and takes iron supplements daily. She describes an episode of fatigue and difficulty walking up steps last Friday, which is unusual for her. She experiences dizziness and lightheadedness upon standing, which resolves after a moment. Her blood pressure tends to be on the low side.  She underwent radiation therapy for lung cancer earlier this year and receives lung imaging every six months, with the most recent CT scan in September.    Patient Active Problem List   Diagnosis Date Noted   Fall 11/23/2023   Pulmonary nodule 08/07/2023   Chronic rhinitis 04/03/2023   Nausea 01/15/2023   Iron deficiency anemia 01/15/2023   Hypokalemia 12/14/2022   Community acquired pneumonia of left lower lobe of lung 11/26/2022   Septic shock (HCC) 11/26/2022   Slow transit constipation 08/18/2022   Preventative health care 02/05/2022   Pulmonary HTN (HCC) 07/04/2021   Hyperlipidemia associated with type 2 diabetes mellitus (HCC) 07/04/2021   Atherosclerosis of aorta 07/04/2021   Angioma of skin 06/02/2021   Vaginal itching 05/23/2021   Hematuria 05/23/2021   Insomnia 05/13/2021   Hypothyroidism 08/24/2020  Chest pain 08/06/2020   Trichomoniasis 04/27/2020   Cervical cancer screening 04/27/2020   Left thyroid  nodule 04/13/2020   Vaginal odor 01/11/2020   Icterus 01/11/2020   Controlled type 2 diabetes mellitus with hyperglycemia, without long-term current use of insulin  (HCC) 09/22/2019   De Quervain's disease (tenosynovitis) 09/22/2019   Neck mass 07/11/2018   Abnormal WBC count 05/29/2017   Skin fissure 09/24/2015   Allergic rhinitis 03/26/2014   Wheezing  03/26/2014   DM (diabetes mellitus) type II uncontrolled, periph vascular disorder 08/25/2011   Headache 08/21/2011   Acute sinusitis 04/26/2010   URI 04/26/2010   UNSPECIFIED CATARACT 02/23/2010   POSTMENOPAUSAL STATUS 12/12/2007   Fatigue 09/11/2007   THYROID  NODULE 11/22/2006   Controlled type 2 diabetes mellitus without complication, without long-term current use of insulin  (HCC) 11/22/2006   Hyperlipidemia LDL goal <70 11/22/2006   GLUCOMA 11/22/2006   Essential hypertension 11/22/2006   GERD 11/22/2006    Past Medical History:  Diagnosis Date   Anemia    Anxiety    Arthritis    Cataract    Chronic headaches    pt states that they have improved - 08/06/23   Coronary artery disease    Depression    Diabetes mellitus    Diverticulosis of colon (without mention of hemorrhage) 2003, 2008   Colonoscopy   Eczema    Emphysema of lung (HCC)    GERD (gastroesophageal reflux disease)    Glaucoma    Hiatal hernia 2003   EGD    History of pneumonia 11/2022   Hyperlipidemia    Hypertension    PONV (postoperative nausea and vomiting)    Thyroid  nodule     Past Surgical History:  Procedure Laterality Date   BRONCHIAL BIOPSY  08/07/2023   Procedure: BRONCHIAL BIOPSIES;  Surgeon: Shelah Lamar RAMAN, MD;  Location: [facility] ENDOSCOPY;  Service: Pulmonary;;   BRONCHIAL BRUSHINGS  08/07/2023   Procedure: BRONCHIAL BRUSHINGS;  Surgeon: Shelah Lamar RAMAN, MD;  Location[facility] END[facility]COPY;  Service: Pulmonary;;   BRONCHIAL NEEDLE ASPIRATION BIOPSY  08/07/2023   Procedure: BRONCHIAL NEEDLE ASPIRATION BIOPSIES;  Surgeon: Shelah Lamar RAMAN, MD;  Location: [facility] ENDOSCOPY;  Service: Pulmonary;;   CARPAL TUNNEL RELEASE  2006   CARPAL TUNNEL RELEASE  12/24/2007   CATARACT EXTRACTION, BILATERAL     [facility]---  sept and oct 2018   CERVICAL FUSION  08/2006   CHOLECYSTECTOMY     COSMETIC SURGERY  December 09 2018   EYE SURGERY  catracts   FASCIECTOMY Left 08/01/2018   Procedure: FASCIECTOMY LEFT RING;  Surgeon:  Murrell Kuba, MD;  Location: [facility];  Service: Orthopedics;  Laterality: Left;  UPPER ARM   FIDUCIAL MARKER PLACEMENT  08/07/2023   Procedure: FIDUCIAL MARKER PLACEMENT;  Surgeon: Shelah Lamar RAMAN, MD;  Location: [facility] ENDOSCOPY;  Service: Pulmonary;;   SPINE SURGERY  neck dic replaced   TRIGGER FINGER RELEASE Left 08/01/2018   Procedure: RELEASE TRIGGER FINGER/A-1 PULLEY LEFTRING;  Surgeon: Murrell Kuba, MD;  Location: [facility];  Service: Orthopedics;  Laterality: Left;   TUBAL LIGATION      Social History   Tobacco Use   Smoking status: Former    Current packs/day: 0.00    Average packs/day: 1.4 packs/day for 49.0 years (68.6 ttl pk-yrs)    Types: Cigarettes    Start date: 07/15/1966    Quit date: 07/16/2015    Years since quitting: 8.8    Passive exposure: Past   Smokeless tobacco: Never  Tobacco comments:    Has not vaped in 2 weeks 12/05/2023  Vaping Use   Vaping status: Some Days  Substance Use Topics   Alcohol use: Yes    Alcohol/week: 0.0 standard drinks of alcohol    Comment: once weekly; wine and beer   Drug use: No    Family History  Problem Relation Age of Onset   Angioedema Mother    Dementia Mother    Breast cancer Mother 56   Kidney failure Mother 28       died from kidney failure from abx given to her for pneumonia   Hyperlipidemia Mother    Hypertension Mother    Diabetes Mother    Cancer Mother 93       breast   Parkinsonism Father    Diabetes Father 84   Hypertension Sister    Diabetes Brother    Hypertension Brother    Heart attack Brother    Heart disease Cousin 68       mi   Colon cancer Neg Hx    Esophageal cancer Neg Hx    Stomach cancer Neg Hx     Allergies  Allergen Reactions   Codeine Nausea Only    Severe nausea   Augmentin  [Amoxicillin -Pot Clavulanate]     Severe nausea and vomitting    Medication list has been reviewed and updated.  Current Outpatient Medications on File Prior to Visit   Medication Sig Dispense Refill   amitriptyline  (ELAVIL ) 25 MG tablet Take 2 tablets (50 mg total) by mouth at bedtime. 180 tablet 2   aspirin  EC 81 MG tablet Take 81 mg by mouth daily.     BIOTIN PO Take 1 tablet by mouth daily.      Coenzyme Q10 (CO Q 10 PO) Take 1 capsule by mouth daily.     fenofibrate  160 MG tablet Take 1 tablet (160 mg total) by mouth daily. 90 tablet 1   Ferrous Sulfate (IRON SUPPLEMENT PO) Take 1 tablet by mouth daily. 65mg      ipratropium (ATROVENT ) 0.06 % nasal spray Place 2 sprays into both nostrils 3 (three) times daily as needed for rhinitis. 15 mL 5   ipratropium (ATROVENT ) 0.06 % nasal spray Place 2 sprays into both nostrils 3 (three) times daily.     latanoprost  (XALATAN ) 0.005 % ophthalmic solution Place 1 drop into both eyes at bedtime.      lisinopril  (ZESTRIL ) 5 MG tablet Take 1 tablet (5 mg total) by mouth daily. 90 tablet 1   metFORMIN  (GLUCOPHAGE -XR) 500 MG 24 hr tablet TAKE 4 TABLETS BY MOUTH EVERY DAY 360 tablet 1   montelukast  (SINGULAIR ) 10 MG tablet Take 1 tablet (10 mg total) by mouth at bedtime. 90 tablet 3   Naproxen Sodium (ALEVE PO) Take 2 tablets by mouth daily as needed (pain).     NONFORMULARY OR COMPOUNDED ITEM Take 1 tablet by mouth at bedtime. Cbd /delta 9 1 po qhs     omeprazole  (PRILOSEC) 20 MG capsule TAKE 1 CAPSULE BY MOUTH DAILY 100 capsule 2   ondansetron  (ZOFRAN ) 4 MG tablet Take 1 tablet (4 mg total) by mouth every 8 (eight) hours as needed for nausea or vomiting. 20 tablet 0   repaglinide  (PRANDIN ) 1 MG tablet Take 2 tablets (2 mg total) by mouth daily. Before evening meal. 180 tablet 0   rosuvastatin  (CRESTOR ) 40 MG tablet TAKE 1 TABLET(40 MG) BY MOUTH AT BEDTIME 90 tablet 1   Semaglutide  (RYBELSUS ) 7 MG TABS Take  1 tablet (7 mg total) by mouth daily.     Semaglutide ,0.25 or 0.5MG /DOS, (OZEMPIC , 0.25 OR 0.5 MG/DOSE,) 2 MG/3ML SOPN 0.25 mg Sq weekly x 4 weeks then increase to 0.5 mg weekly 3 mL 2   No current  facility-administered medications on file prior to visit.    Review of Systems:  As per HPI- otherwise negative.   Physical Examination: Vitals:   05/14/24 1320  BP: 120/68  Pulse: 85  Temp: 98.2 F (36.8 C)  SpO2: 97%   Vitals:   05/14/24 1320  Weight: 131 lb 3.2 oz (59.5 kg)  Height: 5' 6 (1.676 m)   Body mass index is 21.18 kg/m. Ideal Body Weight: Weight in (lb) to have BMI = 25: 154.6  GEN: no acute distress.  Normal weight, looks well HEENT: Atraumatic, Normocephalic.  Bilateral TM wnl, oropharynx normal.  PEERL,EOMI.   Ears and Nose: No external deformity. CV: RRR, No M/G/R. No JVD. No thrill. No extra heart sounds. PULM: CTA B, no wheezes, crackles, rhonchi. No retractions. No resp. distress. No accessory muscle use. ABD: S, NT, ND EXTR: No c/c/e PSYCH: Normally interactive. Conversant.  Please see rash below.  There is similar skin outbreak around her spine at the same level.  Suspect mild shingles    Assessment and Plan: Anemia, unspecified type  Herpes zoster without complication - Plan: Ferritin, CBC, valACYclovir  (VALTREX ) 1000 MG tablet  History of lung cancer - Plan: DG Chest 2 View  Assessment & Plan Shingles (Herpes Zoster) Mild shingles on lower back, likely mild due to previous vaccination. Rash is itchy, not painful. Not contagious for shingles but could transmit chickenpox to non-immune individuals. - Prescribed Valtrex . - Advised to avoid contact with young children and pregnant women for two weeks.  Anemia, unspecified Mild anemia with hemoglobin at 10.9. Present for several years. Possible iron deficiency, further evaluation needed. - Ordered ferritin level. - Rechecked blood counts.  History of lung cancer Lung cancer treated with radiation. Recent CT scan in September, follow-up in March. No current chest pain, but pain on deep breathing. - Ordered chest x-ray.  Signed Harlene Schroeder, MD  Received chest x-ray, called patient.   Possible pneumonia versus remnant of her lung cancer treatment or disease is present on chest x-ray.  Because she has not been feeling all that well we will treat her with antibiotic, I called in a Z-Pak.  I also reached out to radiation oncology team to get any recommendation they might have -right now plan is to repeat a CT in 3 months but I will see if they want to do anything else.  Told patient I will pass along recommendations from radiation oncology as soon as I hear, if she does not hear from me about this please circle back  DG Chest 2 View Result Date: 05/14/2024 CLINICAL DATA:  History of lung cancer.  Anemia. EXAM: CHEST - 2 VIEW COMPARISON:  Chest CT dated 03/03/2024. FINDINGS: Patchy area of airspace opacity in the right lower lobe increased since CT of 03/03/2024 may represent post treatment changes or pneumonia. Recurrent disease is not excluded. The left lung is clear. No pleural effusion pneumothorax. The cardiac silhouette is within normal limits. No acute osseous pathology. Cervical ACDF. IMPRESSION: Progression of airspace density in the right lower lobe may represent post treatment changes, pneumonia, or recurrent disease. Electronically Signed   By: Vanetta Chou M.D.   On: 05/14/2024 14:32   Addendum 11/20, received labs as below.  Message to patient  Results for orders placed or performed in visit on 05/14/24  Ferritin   Collection Time: 05/14/24  1:46 PM  Result Value Ref Range   Ferritin 170.4 10.0 - 291.0 ng/mL  CBC   Collection Time: 05/14/24  1:46 PM  Result Value Ref Range   WBC 9.1 4.0 - 10.5 K/uL   RBC 4.00 3.87 - 5.11 Mil/uL   Platelets 423.0 (H) 150.0 - 400.0 K/uL   Hemoglobin 11.0 (L) 12.0 - 15.0 g/dL   HCT 66.5 (L) 63.9 - 53.9 %   MCV 83.6 78.0 - 100.0 fl   MCHC 33.1 30.0 - 36.0 g/dL   RDW 85.3 88.4 - 84.4 %    "

## 2024-05-14 NOTE — Patient Instructions (Signed)
 Good to see you today- I think you have shingles Start on valtrex -take three times a day for one week Shingles can give chicken pox to a vulnerable person- such as a young child.  However it cannot give shingles to anyone else  Please let me know if the rash is not getting better and I will be in touch with your labs and chest x-ray

## 2024-05-15 ENCOUNTER — Ambulatory Visit: Payer: Self-pay

## 2024-05-15 ENCOUNTER — Encounter: Payer: Self-pay | Admitting: Family Medicine

## 2024-05-15 LAB — CBC
HCT: 33.4 % — ABNORMAL LOW (ref 36.0–46.0)
Hemoglobin: 11 g/dL — ABNORMAL LOW (ref 12.0–15.0)
MCHC: 33.1 g/dL (ref 30.0–36.0)
MCV: 83.6 fl (ref 78.0–100.0)
Platelets: 423 K/uL — ABNORMAL HIGH (ref 150.0–400.0)
RBC: 4 Mil/uL (ref 3.87–5.11)
RDW: 14.6 % (ref 11.5–15.5)
WBC: 9.1 K/uL (ref 4.0–10.5)

## 2024-05-15 LAB — FERRITIN: Ferritin: 170.4 ng/mL (ref 10.0–291.0)

## 2024-05-15 NOTE — Telephone Encounter (Signed)
 FYI Only or Action Required?: FYI only for provider: home care.  Patient was last seen in primary care on 05/14/2024 by Copland, Harlene BROCKS, MD.  Called Nurse Triage reporting Herpes Zoster.  Symptoms began several days ago.  Interventions attempted: Prescription medications: Valtrex.  Symptoms are: stable.  Triage Disposition: Information or Advice Only Call  Patient/caregiver understands and will follow disposition?: Yes Reason for Disposition  Health information question, no triage required and triager able to answer question  Answer Assessment - Initial Assessment Questions Advised patient Valtrex was sent to pharmacy, patient stated she got that but was wondering about a pill for the itch. Educated patient on ways to calm shingles itch - avoid scratching and hot showers. Cool compresses, calamine lotion.   1. REASON FOR CALL: What is the main reason for your call? or How can I best help you?     Patient requesting medication for shingles  2. SYMPTOMS : Do you have any symptoms?      itching  Protocols used: Information Only Call - No Triage-A-AH  Copied from CRM [id number]. Topic: Clinical - Medication Question >> May 15, 2024  8:51 AM Ahlexyia S wrote: Reason for CRM: Pt called in stating that she is having some itchiness on how lower back. Pt was seen yesterday and was told that provider thinks pt has shingles. Pt mentioned she did take some cordaid for the itch and it temporarily fixed the issue. Pt is wanting to know if she can be prescribed something for this. Pt is requesting a callback if she can or cannot be prescribed something.

## 2024-05-15 NOTE — Telephone Encounter (Signed)
 Please advise? Pt requesting medication for itch

## 2024-05-20 ENCOUNTER — Telehealth: Payer: Self-pay

## 2024-05-20 ENCOUNTER — Other Ambulatory Visit: Payer: Self-pay | Admitting: Family Medicine

## 2024-05-20 DIAGNOSIS — E1165 Type 2 diabetes mellitus with hyperglycemia: Secondary | ICD-10-CM

## 2024-05-20 MED ORDER — DAPAGLIFLOZIN PROPANEDIOL 5 MG PO TABS: 5.0000 mg | ORAL_TABLET | Freq: Every day | ORAL | 3 refills | Status: DC

## 2024-05-20 NOTE — Telephone Encounter (Signed)
 Copied from CRM #[id number]. Topic: Clinical - Medication Question >> May 20, 2024  9:57 AM Mesmerise C wrote: Reason for CRM: Patient stated the Semaglutide ,0.25 or 0.5MG /DOS, (OZEMPIC , 0.25 OR 0.5 MG/DOSE,) 2 MG/3ML SOPN isn't helping either losing too much weight and making her sick to her stomach, inquiring if there's another alternative

## 2024-06-03 ENCOUNTER — Other Ambulatory Visit: Payer: Self-pay | Admitting: Family Medicine

## 2024-06-03 ENCOUNTER — Ambulatory Visit (HOSPITAL_BASED_OUTPATIENT_CLINIC_OR_DEPARTMENT_OTHER)
Admission: RE | Admit: 2024-06-03 | Discharge: 2024-06-03 | Disposition: A | Source: Ambulatory Visit | Attending: Family Medicine | Admitting: Family Medicine

## 2024-06-03 ENCOUNTER — Encounter: Payer: Self-pay | Admitting: Family Medicine

## 2024-06-03 ENCOUNTER — Ambulatory Visit: Admitting: Family Medicine

## 2024-06-03 ENCOUNTER — Ambulatory Visit: Payer: Self-pay | Admitting: Family Medicine

## 2024-06-03 ENCOUNTER — Encounter (HOSPITAL_BASED_OUTPATIENT_CLINIC_OR_DEPARTMENT_OTHER): Payer: Self-pay

## 2024-06-03 VITALS — BP 122/60 | HR 90 | Temp 97.8°F | Resp 16 | Ht 66.0 in | Wt 129.0 lb

## 2024-06-03 DIAGNOSIS — D649 Anemia, unspecified: Secondary | ICD-10-CM | POA: Diagnosis not present

## 2024-06-03 DIAGNOSIS — W888XXA Exposure to other ionizing radiation, initial encounter: Secondary | ICD-10-CM | POA: Diagnosis not present

## 2024-06-03 DIAGNOSIS — J309 Allergic rhinitis, unspecified: Secondary | ICD-10-CM

## 2024-06-03 DIAGNOSIS — E1165 Type 2 diabetes mellitus with hyperglycemia: Secondary | ICD-10-CM

## 2024-06-03 DIAGNOSIS — E039 Hypothyroidism, unspecified: Secondary | ICD-10-CM

## 2024-06-03 DIAGNOSIS — I10 Essential (primary) hypertension: Secondary | ICD-10-CM | POA: Diagnosis not present

## 2024-06-03 DIAGNOSIS — C3490 Malignant neoplasm of unspecified part of unspecified bronchus or lung: Secondary | ICD-10-CM

## 2024-06-03 DIAGNOSIS — Z85118 Personal history of other malignant neoplasm of bronchus and lung: Secondary | ICD-10-CM | POA: Diagnosis not present

## 2024-06-03 DIAGNOSIS — R112 Nausea with vomiting, unspecified: Secondary | ICD-10-CM

## 2024-06-03 DIAGNOSIS — Z1211 Encounter for screening for malignant neoplasm of colon: Secondary | ICD-10-CM

## 2024-06-03 DIAGNOSIS — E1169 Type 2 diabetes mellitus with other specified complication: Secondary | ICD-10-CM | POA: Diagnosis not present

## 2024-06-03 DIAGNOSIS — R5383 Other fatigue: Secondary | ICD-10-CM

## 2024-06-03 LAB — CBC WITH DIFFERENTIAL/PLATELET
Basophils Absolute: 0 K/uL (ref 0.0–0.1)
Basophils Relative: 0.5 % (ref 0.0–3.0)
Eosinophils Absolute: 0.3 K/uL (ref 0.0–0.7)
Eosinophils Relative: 3.3 % (ref 0.0–5.0)
HCT: 30.3 % — ABNORMAL LOW (ref 36.0–46.0)
Hemoglobin: 9.9 g/dL — ABNORMAL LOW (ref 12.0–15.0)
Lymphocytes Relative: 20 % (ref 12.0–46.0)
Lymphs Abs: 1.8 K/uL (ref 0.7–4.0)
MCHC: 32.7 g/dL (ref 30.0–36.0)
MCV: 82.4 fl (ref 78.0–100.0)
Monocytes Absolute: 0.7 K/uL (ref 0.1–1.0)
Monocytes Relative: 8.2 % (ref 3.0–12.0)
Neutro Abs: 6.2 K/uL (ref 1.4–7.7)
Neutrophils Relative %: 68 % (ref 43.0–77.0)
Platelets: 462 K/uL — ABNORMAL HIGH (ref 150.0–400.0)
RBC: 3.67 Mil/uL — ABNORMAL LOW (ref 3.87–5.11)
RDW: 15.6 % — ABNORMAL HIGH (ref 11.5–15.5)
WBC: 9.1 K/uL (ref 4.0–10.5)

## 2024-06-03 LAB — LIPID PANEL
Cholesterol: 88 mg/dL (ref 0–200)
HDL: 31.1 mg/dL — ABNORMAL LOW (ref >39.00)
LDL Cholesterol: 32 mg/dL (ref 0–99)
NonHDL: 57.06
Total CHOL/HDL Ratio: 3
Triglycerides: 125 mg/dL (ref 0.0–149.0)
VLDL: 25 mg/dL (ref 0.0–40.0)

## 2024-06-03 LAB — GLUCOSE, POCT (MANUAL RESULT ENTRY): POC Glucose: 197 mg/dL — AB (ref 70–99)

## 2024-06-03 LAB — HEMOGLOBIN A1C: Hgb A1c MFr Bld: 6.5 % (ref 4.6–6.5)

## 2024-06-03 LAB — VITAMIN D 25 HYDROXY (VIT D DEFICIENCY, FRACTURES): VITD: 11.71 ng/mL — ABNORMAL LOW (ref 30.00–100.00)

## 2024-06-03 LAB — COMPREHENSIVE METABOLIC PANEL WITH GFR
ALT: 16 U/L (ref 0–35)
AST: 23 U/L (ref 0–37)
Albumin: 3.5 g/dL (ref 3.5–5.2)
Alkaline Phosphatase: 61 U/L (ref 39–117)
BUN: 12 mg/dL (ref 6–23)
CO2: 26 meq/L (ref 19–32)
Calcium: 6.8 mg/dL — ABNORMAL LOW (ref 8.4–10.5)
Chloride: 101 meq/L (ref 96–112)
Creatinine, Ser: 0.78 mg/dL (ref 0.40–1.20)
GFR: 74.93 mL/min (ref >60.00)
Glucose, Bld: 154 mg/dL — ABNORMAL HIGH (ref 70–99)
Potassium: 3.7 meq/L (ref 3.5–5.1)
Sodium: 141 meq/L (ref 135–145)
Total Bilirubin: 0.3 mg/dL (ref 0.2–1.2)
Total Protein: 6.4 g/dL (ref 6.0–8.3)

## 2024-06-03 LAB — VITAMIN B12: Vitamin B-12: >1500 pg/mL — ABNORMAL HIGH (ref 211–911)

## 2024-06-03 LAB — TSH: TSH: 2.02 u[IU]/mL (ref 0.35–5.50)

## 2024-06-03 LAB — POCT I-STAT CREATININE: Creatinine, Ser: 0.9 mg/dL (ref 0.44–1.00)

## 2024-06-03 MED ORDER — ONDANSETRON 8 MG PO TBDP: 8.0000 mg | ORAL_TABLET | Freq: Three times a day (TID) | ORAL | 0 refills | Status: DC | PRN

## 2024-06-03 MED ORDER — MONTELUKAST SODIUM 10 MG PO TABS: 10.0000 mg | ORAL_TABLET | Freq: Every day | ORAL | 3 refills | Status: AC

## 2024-06-03 MED ORDER — AZITHROMYCIN 250 MG PO TABS: ORAL_TABLET | ORAL | 0 refills | Status: DC

## 2024-06-03 MED ORDER — IOHEXOL 300 MG/ML  SOLN
100.0000 mL | Freq: Once | INTRAMUSCULAR | Status: AC | PRN
  Administered 2024-06-03: 75 mL via INTRAVENOUS

## 2024-06-03 NOTE — Progress Notes (Signed)
 Subjective:    Patient ID: Alyssa Ford, female    DOB: 1951-02-08, 73 y.o.   MRN: [id number]  Chief Complaint  Patient presents with   Nausea    Pt states nausea has been going on since July after having radiation and having fatigue.    HPI Patient is in today for f/u . Discussed the use of AI scribe software for clinical note transcription with the patient, who gave verbal consent to proceed.  History of Present Illness Alyssa Ford is a 73 year old female with non-small cell lung cancer who presents with persistent nausea and fatigue.  She has been experiencing persistent nausea since July, which began during her radiation treatment. The nausea occurs intermittently and is not alleviated by Farxiga , which she started 10 days ago. Ondansetron  provides partial relief, especially when she takes two pills, but is not fully effective.  She reports significant fatigue. She experiences dizziness upon standing, leading to near falls, and has not engaged in her usual walking routine for about three weeks. Her dietary intake is poor, mainly consisting of ramen noodles, supplemented with Boost nutritional drinks. Despite this, she is losing weight and notes she has not been this small since her twenties. No low blood sugar levels despite reduced food intake.  She has a history of shingles a few weeks ago, which was primarily itchy and not painful. She received the shingles vaccine, which may have mitigated the severity of her symptoms.  She experiences nasal congestion and sniffing, using ipratropium nasal spray with some relief. Various antihistamines have been ineffective, and she denies using Singulair  or montelukast  for allergies.  She has difficulty sleeping, a longstanding issue. Ondansetron  helps her sleep slightly better when taken for nausea.  She has a history of lung cancer, although a definitive biopsy was not obtained and she was told she was 99% sure it was cancer. She has not  seen her oncologist since July but has a CT scan scheduled for January. She reports shortness of breath and was informed that a previous chest x-ray was inconclusive for pneumonia versus cancer progression.  She has a history of vaping but quit six months ago, having previously stopped nicotine use, which she believes made quitting easier.    Past Medical History:  Diagnosis Date   Anemia    Anxiety    Arthritis    Cataract    Chronic headaches    pt states that they have improved - 08/06/23   Coronary artery disease    Depression    Diabetes mellitus    Diverticulosis of colon (without mention of hemorrhage) 2003, 2008   Colonoscopy   Eczema    Emphysema of lung (HCC)    GERD (gastroesophageal reflux disease)    Glaucoma    Hiatal hernia 2003   EGD    History of pneumonia 11/2022   Hyperlipidemia    Hypertension    PONV (postoperative nausea and vomiting)    Thyroid  nodule     Past Surgical History:  Procedure Laterality Date   BRONCHIAL BIOPSY  08/07/2023   Procedure: BRONCHIAL BIOPSIES;  Surgeon: Shelah Lamar RAMAN, MD;  Location: [facility] ENDOSCOPY;  Service: Pulmonary;;   BRONCHIAL BRUSHINGS  08/07/2023   Procedure: BRONCHIAL BRUSHINGS;  Surgeon: Shelah Lamar RAMAN, MD;  Location: [facility] ENDOSCOPY;  Service: Pulmonary;;   BRONCHIAL NEEDLE ASPIRATION BIOPSY  08/07/2023   Procedure: BRONCHIAL NEEDLE ASPIRATION BIOPSIES;  Surgeon: Shelah Lamar RAMAN, MD;  Location: [facility] ENDOSCOPY;  Service: Pulmonary;;  CARPAL TUNNEL RELEASE  2006   CARPAL TUNNEL RELEASE  12/24/2007   CATARACT EXTRACTION, BILATERAL     [facility]---  sept and oct 2018   CERVICAL FUSION  08/2006   CHOLECYSTECTOMY     COSMETIC SURGERY  December 09 2018   EYE SURGERY  catracts   FASCIECTOMY Left 08/01/2018   Procedure: FASCIECTOMY LEFT RING;  Surgeon: Murrell Kuba, MD;  Location: [facility];  Service: Orthopedics;  Laterality: Left;  UPPER ARM   FIDUCIAL MARKER PLACEMENT  08/07/2023   Procedure: FIDUCIAL MARKER  PLACEMENT;  Surgeon: Shelah Lamar RAMAN, MD;  Location: [facility] ENDOSCOPY;  Service: Pulmonary;;   SPINE SURGERY  neck dic replaced   TRIGGER FINGER RELEASE Left 08/01/2018   Procedure: RELEASE TRIGGER FINGER/A-1 PULLEY LEFTRING;  Surgeon: Murrell Kuba, MD;  Location: [facility];  Service: Orthopedics;  Laterality: Left;   TUBAL LIGATION      Family History  Problem Relation Age of Onset   Angioedema Mother    Dementia Mother    Breast cancer Mother 59   Kidney failure Mother 15       died from kidney failure from abx given to her for pneumonia   Hyperlipidemia Mother    Hypertension Mother    Diabetes Mother    Cancer Mother 21       breast   Parkinsonism Father    Diabetes Father 60   Hypertension Sister    Diabetes Brother    Hypertension Brother    Heart attack Brother    Heart disease Cousin 42       mi   Colon cancer Neg Hx    Esophageal cancer Neg Hx    Stomach cancer Neg Hx     Social History   Socioeconomic History   Marital status: Married    Spouse name: Not on file   Number of children: Not on file   Years of education: Not on file   Highest education level: 12th grade  Occupational History   Occupation: retired  Tobacco Use   Smoking status: Former    Current packs/day: 0.00    Average packs/day: 1.4 packs/day for 49.0 years (68.6 ttl pk-yrs)    Types: Cigarettes    Start date: 07/15/1966    Quit date: 07/16/2015    Years since quitting: 8.8    Passive exposure: Past   Smokeless tobacco: Never   Tobacco comments:    Has not vaped in 2 weeks 12/05/2023  Vaping Use   Vaping status: Some Days  Substance and Sexual Activity   Alcohol use: Yes    Alcohol/week: 0.0 standard drinks of alcohol    Comment: once weekly; wine and beer   Drug use: No   Sexual activity: Not Currently    Partners: Male  Other Topics Concern   Not on file  Social History Narrative   Exercise--  walk   Social Drivers of Health   Financial Resource Strain: Low Risk   (04/25/2024)   Overall Financial Resource Strain (CARDIA)    Difficulty of Paying Living Expenses: Not very hard  Food Insecurity: No Food Insecurity (04/25/2024)   Hunger Vital Sign    Worried About Running Out of Food in the Last Year: Never true    Ran Out of Food in the Last Year: Never true  Transportation Needs: No Transportation Needs (04/25/2024)   PRAPARE - Administrator, Civil Service (Medical): No    Lack of Transportation (Non-Medical): No  Physical Activity: Insufficiently Active (02/12/2024)   Exercise Vital Sign    Days of Exercise per Week: 4 days    Minutes of Exercise per Session: 30 min  Stress: Stress Concern Present (02/12/2024)   [employer] of Occupational Health - Occupational Stress Questionnaire    Feeling of Stress: Rather much  Social Connections: Moderately Isolated (04/25/2024)   Social Connection and Isolation Panel    Frequency of Communication with Friends and Family: More than three times a week    Frequency of Social Gatherings with Friends and Family: More than three times a week    Attends Religious Services: Patient declined    Database Administrator or Organizations: No    Attends Engineer, Structural: Not on file    Marital Status: Married  Catering Manager Violence: Not At Risk (02/12/2024)   Humiliation, Afraid, Rape, and Kick questionnaire    Fear of Current or Ex-Partner: No    Emotionally Abused: No    Physically Abused: No    Sexually Abused: No    Outpatient Medications Prior to Visit  Medication Sig Dispense Refill   amitriptyline  (ELAVIL ) 25 MG tablet Take 2 tablets (50 mg total) by mouth at bedtime. 180 tablet 2   aspirin  EC 81 MG tablet Take 81 mg by mouth daily.     BIOTIN PO Take 1 tablet by mouth daily.      Coenzyme Q10 (CO Q 10 PO) Take 1 capsule by mouth daily.     dapagliflozin  propanediol (FARXIGA ) 5 MG TABS tablet Take 1 tablet (5 mg total) by mouth daily. 90 tablet 3   fenofibrate  160 MG  tablet Take 1 tablet (160 mg total) by mouth daily. 90 tablet 1   Ferrous Sulfate (IRON SUPPLEMENT PO) Take 1 tablet by mouth daily. 65mg      ipratropium (ATROVENT ) 0.06 % nasal spray Place 2 sprays into both nostrils 3 (three) times daily as needed for rhinitis. 15 mL 5   ipratropium (ATROVENT ) 0.06 % nasal spray Place 2 sprays into both nostrils 3 (three) times daily.     latanoprost  (XALATAN ) 0.005 % ophthalmic solution Place 1 drop into both eyes at bedtime.      lisinopril  (ZESTRIL ) 5 MG tablet Take 1 tablet (5 mg total) by mouth daily. 90 tablet 1   metFORMIN  (GLUCOPHAGE -XR) 500 MG 24 hr tablet TAKE 4 TABLETS BY MOUTH EVERY DAY 360 tablet 1   Naproxen Sodium (ALEVE PO) Take 2 tablets by mouth daily as needed (pain).     NONFORMULARY OR COMPOUNDED ITEM Take 1 tablet by mouth at bedtime. Cbd /delta 9 1 po qhs     omeprazole  (PRILOSEC) 20 MG capsule TAKE 1 CAPSULE BY MOUTH DAILY 100 capsule 2   repaglinide  (PRANDIN ) 1 MG tablet Take 2 tablets (2 mg total) by mouth daily. Before evening meal. 180 tablet 0   rosuvastatin  (CRESTOR ) 40 MG tablet TAKE 1 TABLET(40 MG) BY MOUTH AT BEDTIME 90 tablet 1   valACYclovir  (VALTREX ) 1000 MG tablet Take 1 tablet (1,000 mg total) by mouth 3 (three) times daily. 21 tablet 0   montelukast  (SINGULAIR ) 10 MG tablet Take 1 tablet (10 mg total) by mouth at bedtime. 90 tablet 3   ondansetron  (ZOFRAN ) 4 MG tablet Take 1 tablet (4 mg total) by mouth every 8 (eight) hours as needed for nausea or vomiting. 20 tablet 0   No facility-administered medications prior to visit.    Allergies  Allergen Reactions   Codeine Nausea Only  Severe nausea   Augmentin  [Amoxicillin -Pot Clavulanate]     Severe nausea and vomitting    Review of Systems  Constitutional:  Positive for malaise/fatigue. Negative for chills and fever.  HENT:  Negative for congestion and hearing loss.   Eyes:  Negative for blurred vision and discharge.  Respiratory:  Negative for cough, sputum  production and shortness of breath.   Cardiovascular:  Negative for chest pain, palpitations and leg swelling.  Gastrointestinal:  Negative for abdominal pain, blood in stool, constipation, diarrhea, heartburn, nausea and vomiting.  Genitourinary:  Negative for dysuria, frequency, hematuria and urgency.  Musculoskeletal:  Negative for back pain, falls and myalgias.  Skin:  Negative for rash.  Neurological:  Negative for dizziness, sensory change, loss of consciousness, weakness and headaches.  Endo/Heme/Allergies:  Negative for environmental allergies. Does not bruise/bleed easily.  Psychiatric/Behavioral:  Negative for depression and suicidal ideas. The patient is not nervous/anxious and does not have insomnia.        Objective:    Physical Exam Vitals and nursing note reviewed.  Constitutional:      General: She is not in acute distress.    Appearance: Normal appearance. She is well-developed.  HENT:     Head: Normocephalic and atraumatic.  Eyes:     General: No scleral icterus.       Right eye: No discharge.        Left eye: No discharge.  Cardiovascular:     Rate and Rhythm: Normal rate and regular rhythm.     Heart sounds: No murmur heard. Pulmonary:     Effort: Pulmonary effort is normal. No respiratory distress.     Breath sounds: Normal breath sounds.  Musculoskeletal:        General: Normal range of motion.     Cervical back: Normal range of motion and neck supple.     Right lower leg: No edema.     Left lower leg: No edema.  Skin:    General: Skin is warm and dry.  Neurological:     Mental Status: She is alert and oriented to person, place, and time.  Psychiatric:        Mood and Affect: Mood normal.        Behavior: Behavior normal.        Thought Content: Thought content normal.        Judgment: Judgment normal.     BP 122/60 (BP Location: Left Arm, Patient Position: Sitting, Cuff Size: Normal)   Pulse 90   Temp 97.8 F (36.6 C) (Oral)   Resp 16   Ht  5' 6 (1.676 m)   Wt 129 lb (58.5 kg)   SpO2 98%   BMI 20.82 kg/m  Wt Readings from Last 3 Encounters:  06/03/24 129 lb (58.5 kg)  05/14/24 131 lb 3.2 oz (59.5 kg)  04/29/24 135 lb 12.8 oz (61.6 kg)    Diabetic Foot Exam - Simple   No data filed    Lab Results  Component Value Date   WBC 9.1 06/03/2024   HGB 9.9 (L) 06/03/2024   HCT 30.3 (L) 06/03/2024   PLT 462.0 (H) 06/03/2024   GLUCOSE 154 (H) 06/03/2024   CHOL 88 06/03/2024   TRIG 125.0 06/03/2024   HDL 31.10 (L) 06/03/2024   LDLDIRECT 87.0 06/07/2020   LDLCALC 32 06/03/2024   ALT 16 06/03/2024   AST 23 06/03/2024   NA 141 06/03/2024   K 3.7 06/03/2024   CL 101 06/03/2024   CREATININE 0.90  06/03/2024   BUN 12 06/03/2024   CO2 26 06/03/2024   TSH 2.02 06/03/2024   INR 1.3 (H) 11/26/2022   HGBA1C 6.5 06/03/2024   MICROALBUR 0.7 11/23/2023    Lab Results  Component Value Date   TSH 2.02 06/03/2024   Lab Results  Component Value Date   WBC 9.1 06/03/2024   HGB 9.9 (L) 06/03/2024   HCT 30.3 (L) 06/03/2024   MCV 82.4 06/03/2024   PLT 462.0 (H) 06/03/2024   Lab Results  Component Value Date   NA 141 06/03/2024   K 3.7 06/03/2024   CO2 26 06/03/2024   GLUCOSE 154 (H) 06/03/2024   BUN 12 06/03/2024   CREATININE 0.90 06/03/2024   BILITOT 0.3 06/03/2024   ALKPHOS 61 06/03/2024   AST 23 06/03/2024   ALT 16 06/03/2024   PROT 6.4 06/03/2024   ALBUMIN 3.5 06/03/2024   CALCIUM  6.8 (L) 06/03/2024   ANIONGAP 14 12/29/2022   EGFR 55 (L) 04/18/2024   GFR 74.93 06/03/2024   Lab Results  Component Value Date   CHOL 88 06/03/2024   Lab Results  Component Value Date   HDL 31.10 (L) 06/03/2024   Lab Results  Component Value Date   LDLCALC 32 06/03/2024   Lab Results  Component Value Date   TRIG 125.0 06/03/2024   Lab Results  Component Value Date   CHOLHDL 3 06/03/2024   Lab Results  Component Value Date   HGBA1C 6.5 06/03/2024       Assessment & Plan:  Other fatigue -     CBC with  Differential/Platelet -     Comprehensive metabolic panel with GFR -     Vitamin B12 -     TSH -     VITAMIN D  25 Hydroxy (Vit-D Deficiency, Fractures) -     Hemoglobin A1c  Uncontrolled type 2 diabetes mellitus with hyperglycemia (HCC) -     POCT glucose (manual entry)  Anemia, unspecified type  Hyperlipidemia associated with type 2 diabetes mellitus (HCC) -     Lipid panel  Essential hypertension  Hypothyroidism, unspecified type -     TSH  History of lung cancer -     CT CHEST W CONTRAST; Future -     Ambulatory referral to Gastroenterology  Allergic rhinitis, unspecified seasonality, unspecified trigger -     Montelukast  Sodium; Take 1 tablet (10 mg total) by mouth at bedtime.  Dispense: 90 tablet; Refill: 3  Nausea and vomiting due to ionizing radiation -     Ondansetron ; Take 1 tablet (8 mg total) by mouth every 8 (eight) hours as needed for nausea or vomiting.  Dispense: 20 tablet; Refill: 0  Malignant neoplasm of unspecified part of unspecified bronchus or lung (HCC) -     CT CHEST W CONTRAST; Future  Colon cancer screening -     Ambulatory referral to Gastroenterology  Assessment and Plan Assessment & Plan Malignant neoplasm of the lung   She has non-small cell lung cancer with recent weight loss and shortness of breath. The differential includes pneumonia versus cancer progression, with a recent chest x-ray inconclusive for both. There has been no pulmonology follow-up since July. A CT scan was ordered today to evaluate shortness of breath and potential pneumonia, and coordination with the referral person for CT scan scheduling is complete. She will continue follow-up with her oncologist.  Nausea and vomiting due to cancer therapy   Persistent nausea since July is likely related to radiation therapy. Ondansetron  provides some  relief but is not fully effective. Farxiga  was ineffective and may contribute to symptoms, so the ondansetron  dose was increased and Farxiga   was discontinued.  Type 2 diabetes mellitus with hyperglycemia   Blood sugars are well-managed with the current regimen, though recent weight loss and decreased oral intake are noted. A1c and other relevant labs were checked, and medication adjustment will be considered if A1c is low.  Anemia   She was slightly anemic two weeks ago, potentially due to cancer therapy and nutritional status.  Essential hypertension   Her blood pressure is well-controlled with the current regimen.  Allergic rhinitis   Chronic nasal symptoms persist with ineffective relief from ipratropium nasal spray and antihistamines. She is not currently using Singulair  and will continue with the ipratropium nasal spray.  General health maintenance   She is due for a colonoscopy as previously recommended. A referral for scheduling was provided.    Brittni Hult R Lowne Chase, DO

## 2024-06-09 ENCOUNTER — Other Ambulatory Visit: Payer: Self-pay

## 2024-06-09 DIAGNOSIS — E1169 Type 2 diabetes mellitus with other specified complication: Secondary | ICD-10-CM

## 2024-06-09 MED ORDER — FENOFIBRATE 160 MG PO TABS: 160.0000 mg | ORAL_TABLET | Freq: Every day | ORAL | 1 refills | Status: AC

## 2024-06-16 ENCOUNTER — Ambulatory Visit (AMBULATORY_SURGERY_CENTER)

## 2024-06-16 VITALS — Ht 66.0 in | Wt 128.0 lb

## 2024-06-16 DIAGNOSIS — Z860100 Personal history of colon polyps, unspecified: Secondary | ICD-10-CM

## 2024-06-16 MED ORDER — ONDANSETRON HCL 4 MG PO TABS: 4.0000 mg | ORAL_TABLET | ORAL | 0 refills | Status: AC

## 2024-06-16 MED ORDER — NA SULFATE-K SULFATE-MG SULF 17.5-3.13-1.6 GM/177ML PO SOLN: 1.0000 | Freq: Once | ORAL | 0 refills | Status: AC

## 2024-06-16 NOTE — Progress Notes (Signed)
 PCP MD at time of PV: Lowne, MD __________________________________________________________________________________________________________________________________________  No egg allergy  known to patient  No soy allergy  known to patient PONV and headache with past sedation with any surgeries or procedures Patient denies ever being told they had issues or difficulty with intubation  No FH of Malignant Hyperthermia Pt is not on diet pills Pt is not on  home 02  Pt is not on blood thinners  No A fib or A flutter Have any cardiac testing pending--no  LOA: independent No Chew or Snuff tobacco __________________________________________________________________________________________________________________________________________  Constipation: chronic constipation  Prep: suprep  __________________________________________________________________________________________________________________________________________  PV completed with patient. Prep instructions reviewed and provided during apt. Rx sent to preferred pharmacy.  __________________________________________________________________________________________________________________________________________  Patient's chart reviewed by Norleen Schillings CNRA prior to previsit and patient appropriate for the [facility].  Previsit completed and red dot placed by patient's name on their procedure day (on provider's schedule).

## 2024-06-22 ENCOUNTER — Ambulatory Visit (HOSPITAL_COMMUNITY)
Admission: EM | Admit: 2024-06-22 | Discharge: 2024-06-22 | Disposition: A | Discharge status: Home / Self Care | Attending: Nurse Practitioner | Admitting: Nurse Practitioner

## 2024-06-22 ENCOUNTER — Other Ambulatory Visit: Payer: Self-pay | Admitting: Family Medicine

## 2024-06-22 ENCOUNTER — Encounter (HOSPITAL_COMMUNITY): Payer: Self-pay | Admitting: Emergency Medicine

## 2024-06-22 DIAGNOSIS — L299 Pruritus, unspecified: Secondary | ICD-10-CM | POA: Diagnosis not present

## 2024-06-22 DIAGNOSIS — B029 Zoster without complications: Secondary | ICD-10-CM

## 2024-06-22 DIAGNOSIS — W888XXA Exposure to other ionizing radiation, initial encounter: Secondary | ICD-10-CM

## 2024-06-22 MED ORDER — VALACYCLOVIR HCL 1 G PO TABS: 1000.0000 mg | ORAL_TABLET | Freq: Three times a day (TID) | ORAL | 0 refills | Status: AC

## 2024-06-22 NOTE — ED Triage Notes (Signed)
 Patient c/o itching on both sides of her torso x 2 days.  No new lotions, soaps or detergents.  Patient did have shingles in November.  Patient has applied Coraid, Benadryl , Cetephil cream and Aveeno.

## 2024-06-22 NOTE — Discharge Instructions (Signed)
 You were seen today for itching.  Recommend applying an emollient to the itchy areas (like Aquaphor or Vaseline) to help with dry skin.  You can also take an oral antihistamine like Zyrtec or Allegra to help with itching.  If a rash develops, start the Valtrex  to treat for recurrent shingles.  Seek care if symptoms worsen despite treatment.

## 2024-06-22 NOTE — ED Provider Notes (Signed)
 " [facility]    CSN: [id number] Arrival date & time: 06/22/24  9044      History   Chief Complaint Chief Complaint  Patient presents with   Rash    HPI Alyssa Ford is a 73 y.o. female.   Patient presents today for bilateral torso itching for the past 2 days.  Reports on the right side, it is in the same area where she had shingles a couple of months ago.  She has tried topical Cortaid, oral Benadryl , Cetaphil cream, Aveeno cream and thinks the Aveeno cream helped the most.  No fevers or nausea/vomiting.  She is not feeling itchy in her forearm as well.  No recent change in any detergents, soaps, personal care products.    Past Medical History:  Diagnosis Date   Anemia    Anxiety    Arthritis    Cataract    Chronic headaches    pt states that they have improved - 08/06/23   Coronary artery disease    Depression    Diabetes mellitus    Diverticulosis of colon (without mention of hemorrhage) 2003, 2008   Colonoscopy   Eczema    Emphysema of lung (HCC)    GERD (gastroesophageal reflux disease)    Glaucoma    Hiatal hernia 2003   EGD    History of pneumonia 11/2022   Hyperlipidemia    Hypertension    PONV (postoperative nausea and vomiting)    Thyroid  nodule     Patient Active Problem List   Diagnosis Date Noted   Fall 11/23/2023   Pulmonary nodule 08/07/2023   Chronic rhinitis 04/03/2023   Nausea 01/15/2023   Iron deficiency anemia 01/15/2023   Hypokalemia 12/14/2022   Community acquired pneumonia of left lower lobe of lung 11/26/2022   Septic shock (HCC) 11/26/2022   Slow transit constipation 08/18/2022   Preventative health care 02/05/2022   Pulmonary HTN (HCC) 07/04/2021   Hyperlipidemia associated with type 2 diabetes mellitus (HCC) 07/04/2021   Atherosclerosis of aorta 07/04/2021   Angioma of skin 06/02/2021   Vaginal itching 05/23/2021   Hematuria 05/23/2021   Insomnia 05/13/2021   Hypothyroidism 08/24/2020   Chest pain 08/06/2020    Trichomoniasis 04/27/2020   Cervical cancer screening 04/27/2020   Left thyroid  nodule 04/13/2020   Vaginal odor 01/11/2020   Icterus 01/11/2020   Controlled type 2 diabetes mellitus with hyperglycemia, without long-term current use of insulin  (HCC) 09/22/2019   De Quervain's disease (tenosynovitis) 09/22/2019   Neck mass 07/11/2018   Abnormal WBC count 05/29/2017   Skin fissure 09/24/2015   Allergic rhinitis 03/26/2014   Wheezing 03/26/2014   DM (diabetes mellitus) type II uncontrolled, periph vascular disorder 08/25/2011   Headache 08/21/2011   Acute sinusitis 04/26/2010   URI 04/26/2010   UNSPECIFIED CATARACT 02/23/2010   POSTMENOPAUSAL STATUS 12/12/2007   Fatigue 09/11/2007   THYROID  NODULE 11/22/2006   Controlled type 2 diabetes mellitus without complication, without long-term current use of insulin  (HCC) 11/22/2006   Hyperlipidemia LDL goal <70 11/22/2006   GLUCOMA 11/22/2006   Essential hypertension 11/22/2006   GERD 11/22/2006    Past Surgical History:  Procedure Laterality Date   BRONCHIAL BIOPSY  08/07/2023   Procedure: BRONCHIAL BIOPSIES;  Surgeon: Shelah Lamar RAMAN, MD;  Location: [facility] ENDOSCOPY;  Service: Pulmonary;;   BRONCHIAL BRUSHINGS  08/07/2023   Procedure: BRONCHIAL BRUSHINGS;  Surgeon: Shelah Lamar RAMAN, MD;  Location: [facility] ENDOSCOPY;  Service: Pulmonary;;   BRONCHIAL NEEDLE ASPIRATION BIOPSY  08/07/2023  Procedure: BRONCHIAL NEEDLE ASPIRATION BIOPSIES;  Surgeon: Shelah Lamar RAMAN, MD;  Location: [facility] ENDOSCOPY;  Service: Pulmonary;;   CARPAL TUNNEL RELEASE  2006   CARPAL TUNNEL RELEASE  12/24/2007   CATARACT EXTRACTION, BILATERAL     [place]---  sept and oct 2018   CERVICAL FUSION  08/2006   CHOLECYSTECTOMY     COSMETIC SURGERY  December 09 2018   EYE SURGERY  catracts   FASCIECTOMY Left 08/01/2018   Procedure: FASCIECTOMY LEFT RING;  Surgeon: Murrell Kuba, MD;  Location: [facility];  Service: Orthopedics;  Laterality: Left;  UPPER ARM   FIDUCIAL MARKER  PLACEMENT  08/07/2023   Procedure: FIDUCIAL MARKER PLACEMENT;  Surgeon: Shelah Lamar RAMAN, MD;  Location: [facility] ENDOSCOPY;  Service: Pulmonary;;   SPINE SURGERY  neck dic replaced   TRIGGER FINGER RELEASE Left 08/01/2018   Procedure: RELEASE TRIGGER FINGER/A-1 PULLEY LEFTRING;  Surgeon: Murrell Kuba, MD;  Location: [facility];  Service: Orthopedics;  Laterality: Left;   TUBAL LIGATION      OB History     Gravida  3   Para  3   Term      Preterm      AB      Living         SAB      IAB      Ectopic      Multiple      Live Births               Home Medications    Prior to Admission medications  Medication Sig Start Date End Date Taking? Authorizing Provider  amitriptyline  (ELAVIL ) 25 MG tablet Take 2 tablets (50 mg total) by mouth at bedtime. 04/29/24  Yes Antonio Cyndee Jamee JONELLE, DO  aspirin  EC 81 MG tablet Take 81 mg by mouth daily.   Yes Antonio Cyndee, Yvonne R, DO  BIOTIN PO Take 1 tablet by mouth daily.    Yes [provider]  Coenzyme Q10 (CO Q 10 PO) Take 1 capsule by mouth daily.   Yes [provider]  fenofibrate  160 MG tablet Take 1 tablet (160 mg total) by mouth daily. 06/09/24  Yes Lowne Chase, Yvonne R, DO  Ferrous Sulfate (IRON SUPPLEMENT PO) Take 1 tablet by mouth daily. 65mg    Yes [provider]  ipratropium (ATROVENT ) 0.06 % nasal spray Place 2 sprays into both nostrils 3 (three) times daily. 04/29/24  Yes Antonio Cyndee Jamee R, DO  latanoprost  (XALATAN ) 0.005 % ophthalmic solution Place 1 drop into both eyes at bedtime.  10/17/13  Yes [provider]  lisinopril  (ZESTRIL ) 5 MG tablet Take 1 tablet (5 mg total) by mouth daily. 08/02/23  Yes Antonio Cyndee Jamee R, DO  metFORMIN  (GLUCOPHAGE -XR) 500 MG 24 hr tablet TAKE 4 TABLETS BY MOUTH EVERY DAY 01/25/24  Yes Lowne Chase, Yvonne R, DO  montelukast  (SINGULAIR ) 10 MG tablet Take 1 tablet (10 mg total) by mouth at bedtime. 06/03/24  Yes Lowne Chase, Yvonne R, DO   Naproxen Sodium (ALEVE PO) Take 2 tablets by mouth daily as needed (pain).   Yes [provider]  NONFORMULARY OR COMPOUNDED ITEM Take 1 tablet by mouth at bedtime. Cbd /delta 9 1 po qhs 08/07/23  Yes Byrum, Lamar RAMAN, MD  omeprazole  (PRILOSEC) 20 MG capsule TAKE 1 CAPSULE BY MOUTH DAILY 11/02/23  Yes Lowne Chase, Yvonne R, DO  ondansetron  (ZOFRAN ) 4 MG tablet Take 1 tablet (4 mg total) by mouth as directed.  06/16/24  Yes Nandigam, Kavitha V, MD  ondansetron  (ZOFRAN -ODT) 8 MG disintegrating tablet Take 1 tablet (8 mg total) by mouth every 8 (eight) hours as needed for nausea or vomiting. 06/03/24  Yes Antonio Cyndee Rockers R, DO  repaglinide  (PRANDIN ) 1 MG tablet Take 2 tablets (2 mg total) by mouth daily. Before evening meal. 04/16/24  Yes Antonio Cyndee Rockers R, DO  rosuvastatin  (CRESTOR ) 40 MG tablet TAKE 1 TABLET(40 MG) BY MOUTH AT BEDTIME 01/25/24  Yes Antonio Cyndee Rockers R, DO  dapagliflozin  propanediol (FARXIGA ) 5 MG TABS tablet Take 1 tablet (5 mg total) by mouth daily. Patient not taking: Reported on 06/16/2024 05/20/24   Antonio Cyndee Rockers R, DO  valACYclovir  (VALTREX ) 1000 MG tablet Take 1 tablet (1,000 mg total) by mouth 3 (three) times daily for 7 days. 06/22/24 06/29/24  Chandra Harlene LABOR, NP    Family History Family History  Problem Relation Age of Onset   Angioedema Mother    Dementia Mother    Breast cancer Mother 5   Kidney failure Mother 32       died from kidney failure from abx given to her for pneumonia   Hyperlipidemia Mother    Hypertension Mother    Diabetes Mother    Cancer Mother 32       breast   Parkinsonism Father    Diabetes Father 26   Hypertension Sister    Diabetes Brother    Hypertension Brother    Heart attack Brother    Heart disease Cousin 30       mi   Colon cancer Neg Hx    Esophageal cancer Neg Hx    Stomach cancer Neg Hx     Social History Social History[1]   Allergies   Codeine and Augmentin  [amoxicillin -pot  clavulanate]   Review of Systems Review of Systems Per HPI  Physical Exam Triage Vital Signs ED Triage Vitals  Encounter Vitals Group     BP 06/22/24 1122 125/73     Girls Systolic BP Percentile --      Girls Diastolic BP Percentile --      Boys Systolic BP Percentile --      Boys Diastolic BP Percentile --      Pulse Rate 06/22/24 1122 94     Resp 06/22/24 1122 18     Temp 06/22/24 1122 98.2 F (36.8 C)     Temp Source 06/22/24 1122 Oral     SpO2 06/22/24 1122 95 %     Weight 06/22/24 1120 130 lb (59 kg)     Height 06/22/24 1120 5' 6 (1.676 m)     Head Circumference --      Peak Flow --      Pain Score 06/22/24 1120 0     Pain Loc --      Pain Education --      Exclude from Growth Chart --    No data found.  Updated Vital Signs BP 125/73 (BP Location: Left Arm)   Pulse 94   Temp 98.2 F (36.8 C) (Oral)   Resp 18   Ht 5' 6 (1.676 m)   Wt 130 lb (59 kg)   SpO2 95%   BMI 20.98 kg/m   Visual Acuity Right Eye Distance:   Left Eye Distance:   Bilateral Distance:    Right Eye Near:   Left Eye Near:    Bilateral Near:     Physical Exam Vitals and nursing note reviewed.  Constitutional:  General: She is not in acute distress.    Appearance: Normal appearance. She is not toxic-appearing.  HENT:     Head: Normocephalic and atraumatic.     Right Ear: External ear normal.     Left Ear: External ear normal.     Nose: Nose normal. No congestion or rhinorrhea.     Mouth/Throat:     Mouth: Mucous membranes are moist.     Pharynx: Oropharynx is clear.  Pulmonary:     Effort: Pulmonary effort is normal. No respiratory distress.  Skin:    General: Skin is warm and dry.     Capillary Refill: Capillary refill takes less than 2 seconds.     Coloration: Skin is not jaundiced or pale.     Findings: No erythema or rash.  Neurological:     Mental Status: She is alert and oriented to person, place, and time.  Psychiatric:        Behavior: Behavior is  cooperative.      UC Treatments / Results  Labs (all labs ordered are listed, but only abnormal results are displayed) Labs Reviewed - No data to display  EKG   Radiology No results found.  Procedures Procedures (including critical care time)  Medications Ordered in UC Medications - No data to display  Initial Impression / Assessment and Plan / UC Course  I have reviewed the triage vital signs and the nursing notes.  Pertinent labs & imaging results that were available during my care of the patient were reviewed by me and considered in my medical decision making (see chart for details).   Patient is a pleasant, well-appearing 73 year old female presenting today for bilateral torso itching and itching of the right forearm.  No appreciable rash visualized on exam.  Vital signs are stable and exam is reassuring overall.  Will treat itching with topical emollient, oral antihistamine.  Recommended Valtrex  if blisters/lesions develop consistent with shingles and prescription sent to pharmacy.  Return and ER precautions discussed.  The patient was given the opportunity to ask questions.  All questions answered to their satisfaction.  The patient is in agreement to this plan.   Final Clinical Impressions(s) / UC Diagnoses   Final diagnoses:  Pruritus     Discharge Instructions      You were seen today for itching.  Recommend applying an emollient to the itchy areas (like Aquaphor or Vaseline) to help with dry skin.  You can also take an oral antihistamine like Zyrtec or Allegra to help with itching.  If a rash develops, start the Valtrex  to treat for recurrent shingles.  Seek care if symptoms worsen despite treatment.    ED Prescriptions     Medication Sig Dispense Auth. Provider   valACYclovir  (VALTREX ) 1000 MG tablet Take 1 tablet (1,000 mg total) by mouth 3 (three) times daily for 7 days. 21 tablet Chandra Harlene LABOR, NP      PDMP not reviewed this encounter.    [1]   Social History Tobacco Use   Smoking status: Former    Current packs/day: 0.00    Average packs/day: 1.4 packs/day for 49.0 years (68.6 ttl pk-yrs)    Types: Cigarettes    Start date: 07/15/1966    Quit date: 07/16/2015    Years since quitting: 8.9    Passive exposure: Past   Smokeless tobacco: Never   Tobacco comments:    Has not vaped in 2 weeks 12/05/2023  Vaping Use   Vaping status: Some Days  Substance  Use Topics   Alcohol use: Yes    Alcohol/week: 0.0 standard drinks of alcohol    Comment: once weekly; wine and beer   Drug use: No     Chandra Harlene LABOR, NP 06/22/24 1215  "

## 2024-06-23 ENCOUNTER — Ambulatory Visit: Payer: Self-pay

## 2024-06-23 NOTE — Telephone Encounter (Signed)
 FYI Only or Action Required?: Action required by provider: clinical question for provider and update on patient condition.  Patient was last seen in primary care on 06/03/2024 by Antonio Meth, Jamee SAUNDERS, DO.  Called Nurse Triage reporting Fall.  Symptoms began today.  Interventions attempted: Nothing.  Symptoms are: stable.  Triage Disposition: Home Care  Patient/caregiver understands and will follow disposition?: Yes    Copied from CRM [id number]. Topic: Clinical - Red Word Triage >> Jun 23, 2024 10:41 AM Ashley SAUNDERS wrote: Red Word that prompted transfer to Nurse Triage: [place] and hit head last night. Currently feeling fine, but wanted to leave a note for nurse.     Reason for Disposition  [1] Recent fall AND [2] no injury  Answer Assessment - Initial Assessment Questions Pt called in to report fall this morning, 12/29, around 1:30-2am when getting up to go to the bathroom. Pt reports going to UC yesterday for itching and suspected she was having a shingles flare, no rash and dx of pruritus but rx of valtrex  prescribed. Pt reports she was educated on s/e of dizziness with medication and not to change positions quickly. Pt reports getting up in the middle of the night to use the bathroom and falling. Pt reports being drowsy so timeline is unclear. Pt did fall on her R side, bruising her R knee. Pt reports getting to a seated position but could not stand up on her own so her husband had to help her up. Pt reports when he tried to help her up, she fell back and hit her head on the hardwood floor. Pt did not go to ED or call EMS but went back to sleep. Pt denies any n/v, h/a, dizziness at this time. Pt reports taking a daily ASA. Pt denies need to go to hospital but wanted to reach out to PCP to see if she should get a head CT or any imaging. Pt available via phone. Please advise.    1. MECHANISM: How did the fall happen?     Pt reports falling during the night when getting up to go to the  restroom; unable to get up without husband's assistance   2. DOMESTIC VIOLENCE AND ELDER ABUSE SCREENING: Did you fall because someone pushed you or tried to hurt you? If Yes, ask: Are you safe now?     No  3. ONSET: When did the fall happen? (e.g., minutes, hours, or days ago)     Today at 1:30-2am   4. LOCATION: What part of the body hit the ground? (e.g., back, buttocks, head, hips, knees, hands, head, stomach)     R side, bruise to R knee and back of her head   5. INJURY: Did you hurt (injure) yourself when you fell? If Yes, ask: What did you injure? Tell me more about this? (e.g., body area; type of injury; pain severity)     Pt denies injury; reports bruise to R knee  6. PAIN: Is there any pain? If Yes, ask: How bad is the pain? (e.g., Scale 0-10; or none, mild,      No   7. SIZE: For cuts, bruises, or swelling, ask: How large is it? (e.g., inches or centimeters)      Bruise on R knee   9. OTHER SYMPTOMS: Do you have any other symptoms? (e.g., dizziness, fever, weakness; new-onset or worsening).      Asymptomatic; pt reports being on valtrex  d/t pruritus which has s/e of dizziness and was instructed not  to get up too quickly   10. CAUSE: What do you think caused the fall (or falling)? (e.g., dizzy spell, tripped)       S/e from medication; getting up half asleep to go to bathroom  Protocols used: Falls and [facility]

## 2024-06-24 NOTE — Telephone Encounter (Signed)
 Spoke with patient. Pt states doing better and taking it easy. Pt reports no headache, dizziness, blurred vision or confusion. I advised pt if she starts to experience the sxs that were discussed to go to the ER

## 2024-06-30 ENCOUNTER — Encounter: Payer: Self-pay | Admitting: Gastroenterology

## 2024-06-30 ENCOUNTER — Telehealth: Payer: Self-pay | Admitting: Gastroenterology

## 2024-06-30 NOTE — Telephone Encounter (Signed)
 Patient called stating her insurance has changed. New insurance information has been submitted. Patient is unsure if prior shara is needed for upcoming colonoscopy. Please advise, thank you

## 2024-07-02 ENCOUNTER — Other Ambulatory Visit

## 2024-07-02 ENCOUNTER — Telehealth: Payer: Self-pay | Admitting: Radiation Oncology

## 2024-07-02 NOTE — Telephone Encounter (Signed)
 Contacted pt to r/s 1/19 telephone f/u due to provider schedule change. Pt states she was advised by PA Lanell to f/u in March. Pt requested to cx this appt 1/7 since she feels it is not needed at this time.

## 2024-07-03 ENCOUNTER — Ambulatory Visit: Admitting: Gastroenterology

## 2024-07-03 ENCOUNTER — Encounter: Payer: Self-pay | Admitting: Gastroenterology

## 2024-07-03 VITALS — BP 134/77 | HR 80 | Temp 97.7°F | Resp 11 | Ht 66.0 in | Wt 128.0 lb

## 2024-07-03 DIAGNOSIS — K648 Other hemorrhoids: Secondary | ICD-10-CM | POA: Diagnosis not present

## 2024-07-03 DIAGNOSIS — Z860100 Personal history of colon polyps, unspecified: Secondary | ICD-10-CM

## 2024-07-03 DIAGNOSIS — D120 Benign neoplasm of cecum: Secondary | ICD-10-CM

## 2024-07-03 DIAGNOSIS — D123 Benign neoplasm of transverse colon: Secondary | ICD-10-CM

## 2024-07-03 DIAGNOSIS — Z860101 Personal history of adenomatous and serrated colon polyps: Secondary | ICD-10-CM

## 2024-07-03 DIAGNOSIS — Z1211 Encounter for screening for malignant neoplasm of colon: Secondary | ICD-10-CM | POA: Diagnosis not present

## 2024-07-03 DIAGNOSIS — K644 Residual hemorrhoidal skin tags: Secondary | ICD-10-CM | POA: Diagnosis not present

## 2024-07-03 DIAGNOSIS — D125 Benign neoplasm of sigmoid colon: Secondary | ICD-10-CM

## 2024-07-03 DIAGNOSIS — K635 Polyp of colon: Secondary | ICD-10-CM | POA: Diagnosis not present

## 2024-07-03 MED ORDER — SODIUM CHLORIDE 0.9 % IV SOLN: 500.0000 mL | Freq: Once | INTRAVENOUS | Status: AC

## 2024-07-03 NOTE — Op Note (Signed)
 [facility] Patient Name: Alyssa Ford Procedure Date: 07/03/2024 9:19 AM MRN: [id number] Endoscopist: Gustav ALONSO Mcgee , MD, [id number] Age: 74 Referring MD:  Date of Birth: Nov 20, 1950 Gender: Female Account #: [id number] Procedure:                Colonoscopy Indications:              High risk colon cancer surveillance: Personal                            history of adenoma (10 mm or greater in size), High                            risk colon cancer surveillance: Personal history of                            multiple (3 or more) adenomas Medicines:                Monitored Anesthesia Care Procedure:                Pre-Anesthesia Assessment:                           - Prior to the procedure, a History and Physical                            was performed, and patient medications and                            allergies were reviewed. The patient's tolerance of                            previous anesthesia was also reviewed. The risks                            and benefits of the procedure and the sedation                            options and risks were discussed with the patient.                            All questions were answered, and informed consent                            was obtained. Prior Anticoagulants: The patient has                            taken no anticoagulant or antiplatelet agents. ASA                            Grade Assessment: II - A patient with mild systemic                            disease. After reviewing the risks and benefits,  the patient was deemed in satisfactory condition to                            undergo the procedure.                           After obtaining informed consent, the colonoscope                            was passed under direct vision. Throughout the                            procedure, the patient's blood pressure, pulse, and                            oxygen saturations were  monitored continuously. The                            Olympus Scope SN [id number] was introduced through the                            anus and advanced to the the cecum, identified by                            appendiceal orifice and ileocecal valve. The                            colonoscopy was performed without difficulty. The                            patient tolerated the procedure well. The quality                            of the bowel preparation was adequate to identify                            polyps greater than 5 mm in size. The ileocecal                            valve, appendiceal orifice, and rectum were                            photographed. Scope In: 9:31:21 AM Scope Out: 10:01:05 AM Scope Withdrawal Time: 0 hours 19 minutes 34 seconds  Total Procedure Duration: 0 hours 29 minutes 44 seconds  Findings:                 The perianal and digital rectal examinations were                            normal.                           Eleven sessile polyps were found in the sigmoid  colon X 1, transverse colon X 3 and cecum X 7. The                            polyps were 4 to 18 mm in size. These polyps were                            removed with a cold snare. Resection and retrieval                            were complete.                           Non-bleeding external and internal hemorrhoids were                            found during retroflexion. The hemorrhoids were                            small. Complications:            No immediate complications. Estimated Blood Loss:     Estimated blood loss was minimal. Impression:               - Ten 4 to 18 mm polyps in the sigmoid colon, in                            the transverse colon and in the cecum, removed with                            a cold snare. Resected and retrieved.                           - Non-bleeding external and internal hemorrhoids. Recommendation:           - Resume  previous diet.                           - Continue present medications.                           - Await pathology results.                           - No ibuprofen, naproxen, or other non-steroidal                            anti-inflammatory drugs X 2 weeks.                           - Repeat colonoscopy in 1 year because the bowel                            preparation was suboptimal and for surveillance                            based on  pathology results.                           - For future colonoscopy the patient will require                            an extended preparation. If there are any                            questions, please contact the gastroenterologist. Gustav ALONSO Mcgee, MD 07/03/2024 10:07:00 AM This report has been signed electronically.

## 2024-07-03 NOTE — Patient Instructions (Addendum)
 Handouts provided on polyps and hemorrhoids.  Resume previous diet.  No anti-inflammatory medications for 2 weeks.  Repeat colonoscopy in 1 year.    YOU HAD AN ENDOSCOPIC PROCEDURE TODAY AT THE [facility]:   Refer to the procedure report that was given to you for any specific questions about what was found during the examination.  If the procedure report does not answer your questions, please call your gastroenterologist to clarify.  If you requested that your care partner not be given the details of your procedure findings, then the procedure report has been included in a sealed envelope for you to review at your convenience later.  YOU SHOULD EXPECT: Some feelings of bloating in the abdomen. Passage of more gas than usual.  Walking can help get rid of the air that was put into your GI tract during the procedure and reduce the bloating. If you had a lower endoscopy (such as a colonoscopy or flexible sigmoidoscopy) you may notice spotting of blood in your stool or on the toilet paper. If you underwent a bowel prep for your procedure, you may not have a normal bowel movement for a few days.  Please Note:  You might notice some irritation and congestion in your nose or some drainage.  This is from the oxygen used during your procedure.  There is no need for concern and it should clear up in a day or so.  SYMPTOMS TO REPORT IMMEDIATELY:  Following lower endoscopy (colonoscopy or flexible sigmoidoscopy):  Excessive amounts of blood in the stool  Significant tenderness or worsening of abdominal pains  Swelling of the abdomen that is new, acute  Fever of 100F or higher  For urgent or emergent issues, a gastroenterologist can be reached at any hour by calling [phone]. Do not use MyChart messaging for urgent concerns.    DIET:  We do recommend a small meal at first, but then you may proceed to your regular diet.  Drink plenty of fluids but you should avoid alcoholic beverages for 24  hours.  ACTIVITY:  You should plan to take it easy for the rest of today and you should NOT DRIVE or use heavy machinery until tomorrow (because of the sedation medicines used during the test).    FOLLOW UP: Our staff will call the number listed on your records the next business day following your procedure.  We will call around 7:15- 8:00 am to check on you and address any questions or concerns that you may have regarding the information given to you following your procedure. If we do not reach you, we will leave a message.     If any biopsies were taken you will be contacted by phone or by letter within the next 1-3 weeks.  Please call us  at [phone] if you have not heard about the biopsies in 3 weeks.    SIGNATURES/CONFIDENTIALITY: You and/or your care partner have signed paperwork which will be entered into your electronic medical record.  These signatures attest to the fact that that the information above on your After Visit Summary has been reviewed and is understood.  Full responsibility of the confidentiality of this discharge information lies with you and/or your care-partner.

## 2024-07-03 NOTE — Progress Notes (Signed)
 Pt's states no medical or surgical changes since previsit or office visit.

## 2024-07-03 NOTE — Progress Notes (Signed)
 A/o x 3, VSS, good SR's, pleased with anesthesia, report to RN

## 2024-07-03 NOTE — Progress Notes (Signed)
 Called to room to assist during endoscopic procedure.  Patient ID and intended procedure confirmed with present staff. Received instructions for my participation in the procedure from the performing physician.

## 2024-07-03 NOTE — Progress Notes (Signed)
 [facility] History and Physical   Primary Care Physician:  Antonio Cyndee Jamee JONELLE, DO   Reason for Procedure:  History of adenomatous colon polyps  Plan:    Surveillance colonoscopy with possible interventions as needed     HPI: Alyssa Ford is a very pleasant 74 y.o. female here for surveillance colonoscopy. Denies any nausea, vomiting, abdominal pain, melena or bright red blood per rectum  The risks and benefits as well as alternatives of endoscopic procedure(s) have been discussed and reviewed.  The patient was provided an opportunity to ask questions and all were answered. The patient agreed with the plan and demonstrated an understanding of the instructions.   Past Medical History:  Diagnosis Date   Anemia    Anxiety    Arthritis    Cataract    Chronic headaches    pt states that they have improved - 08/06/23   Coronary artery disease    Depression    Diabetes mellitus    Diverticulosis of colon (without mention of hemorrhage) 2003, 2008   Colonoscopy   Eczema    Emphysema of lung (HCC)    GERD (gastroesophageal reflux disease)    Glaucoma    Hiatal hernia 2003   EGD    History of pneumonia 11/2022   Hyperlipidemia    Hypertension    PONV (postoperative nausea and vomiting)    Thyroid  nodule     Past Surgical History:  Procedure Laterality Date   BRONCHIAL BIOPSY  08/07/2023   Procedure: BRONCHIAL BIOPSIES;  Surgeon: Shelah Lamar RAMAN, MD;  Location: [facility]OSCOPY;  Service: Pulmonary;;   BRONCHIAL BRUSHINGS  08/07/2023   Procedure: BRONCHIAL BRUSHINGS;  Surgeon: Shelah Lamar RAMAN, MD;  Location: [facility] END[facility]COPY;  Service: Pulmonary;;   BRONCHIAL NEEDLE ASPIRATION BIOPSY  08/07/2023   Procedure: BRONCHIAL NEEDLE ASPIRATION BIOPSIES;  Surgeon: Shelah Lamar RAMAN, MD;  Location: [facility] ENDOSCOPY;  Service: Pulmonary;;   CARPAL TUNNEL RELEASE  2006   CARPAL TUNNEL RELEASE  12/24/2007   CATARACT EXTRACTION, BILATERAL     [place]---  sept and oct 2018   CERVICAL  FUSION  08/2006   CHOLECYSTECTOMY     COSMETIC SURGERY  December 09 2018   EYE SURGERY  catracts   FASCIECTOMY Left 08/01/2018   Procedure: FASCIECTOMY LEFT RING;  Surgeon: Murrell Kuba, MD;  Location: [facility];  Service: Orthopedics;  Laterality: Left;  UPPER ARM   FIDUCIAL MARKER PLACEMENT  08/07/2023   Procedure: FIDUCIAL MARKER PLACEMENT;  Surgeon: Shelah Lamar RAMAN, MD;  Location: [facility] ENDOSCOPY;  Service: Pulmonary;;   SPINE SURGERY  neck dic replaced   TRIGGER FINGER RELEASE Left 08/01/2018   Procedure: RELEASE TRIGGER FINGER/A-1 PULLEY LEFTRING;  Surgeon: Murrell Kuba, MD;  Location: [facility];  Service: Orthopedics;  Laterality: Left;   TUBAL LIGATION      Prior to Admission medications  Medication Sig Start Date End Date Taking? Authorizing Provider  amitriptyline  (ELAVIL ) 25 MG tablet Take 2 tablets (50 mg total) by mouth at bedtime. 04/29/24  Yes Antonio Cyndee Jamee JONELLE, DO  aspirin  EC 81 MG tablet Take 81 mg by mouth daily.   Yes Antonio Cyndee, Yvonne R, DO  BIOTIN PO Take 1 tablet by mouth daily.    Yes [provider]  Coenzyme Q10 (CO Q 10 PO) Take 1 capsule by mouth daily.   Yes [provider]  fenofibrate  160 MG tablet Take 1 tablet (160 mg total) by mouth daily. 06/09/24  Yes Antonio Cyndee,  Yvonne R, DO  Ferrous Sulfate (IRON SUPPLEMENT PO) Take 1 tablet by mouth daily. 65mg    Yes [provider]  ipratropium (ATROVENT ) 0.06 % nasal spray Place 2 sprays into both nostrils 3 (three) times daily. 04/29/24  Yes Antonio Cyndee Rockers R, DO  latanoprost  (XALATAN ) 0.005 % ophthalmic solution Place 1 drop into both eyes at bedtime.  10/17/13  Yes [provider]  lisinopril  (ZESTRIL ) 5 MG tablet Take 1 tablet (5 mg total) by mouth daily. 08/02/23  Yes Antonio Cyndee Rockers R, DO  metFORMIN  (GLUCOPHAGE -XR) 500 MG 24 hr tablet TAKE 4 TABLETS BY MOUTH EVERY DAY 01/25/24  Yes Lowne Chase, Yvonne R, DO  montelukast  (SINGULAIR ) 10 MG tablet  Take 1 tablet (10 mg total) by mouth at bedtime. 06/03/24  Yes Antonio Cyndee, Rockers R, DO  omeprazole  (PRILOSEC) 20 MG capsule TAKE 1 CAPSULE BY MOUTH DAILY 11/02/23  Yes Lowne Chase, Yvonne R, DO  ondansetron  (ZOFRAN -ODT) 8 MG disintegrating tablet DISSOLVE 1 TABLET(8 MG) ON THE TONGUE EVERY 8 HOURS AS NEEDED FOR NAUSEA OR VOMITING 06/23/24  Yes Webb, Padonda B, FNP  rosuvastatin  (CRESTOR ) 40 MG tablet TAKE 1 TABLET(40 MG) BY MOUTH AT BEDTIME 01/25/24  Yes Lowne Chase, Yvonne R, DO  dapagliflozin  propanediol (FARXIGA ) 5 MG TABS tablet Take 1 tablet (5 mg total) by mouth daily. Patient not taking: No sig reported 05/20/24   Antonio Cyndee, Yvonne R, DO  Naproxen Sodium (ALEVE PO) Take 2 tablets by mouth daily as needed (pain).    [provider]  NONFORMULARY OR COMPOUNDED ITEM Take 1 tablet by mouth at bedtime. Cbd /delta 9 1 po qhs 08/07/23   Byrum, Robert S, MD  ondansetron  (ZOFRAN ) 4 MG tablet Take 1 tablet (4 mg total) by mouth as directed. 06/16/24   Dineen Conradt V, MD  repaglinide  (PRANDIN ) 1 MG tablet Take 2 tablets (2 mg total) by mouth daily. Before evening meal. 04/16/24   Antonio Cyndee Rockers JONELLE, DO    Current Outpatient Medications  Medication Sig Dispense Refill   amitriptyline  (ELAVIL ) 25 MG tablet Take 2 tablets (50 mg total) by mouth at bedtime. 180 tablet 2   aspirin  EC 81 MG tablet Take 81 mg by mouth daily.     BIOTIN PO Take 1 tablet by mouth daily.      Coenzyme Q10 (CO Q 10 PO) Take 1 capsule by mouth daily.     fenofibrate  160 MG tablet Take 1 tablet (160 mg total) by mouth daily. 90 tablet 1   Ferrous Sulfate (IRON SUPPLEMENT PO) Take 1 tablet by mouth daily. 65mg      ipratropium (ATROVENT ) 0.06 % nasal spray Place 2 sprays into both nostrils 3 (three) times daily.     latanoprost  (XALATAN ) 0.005 % ophthalmic solution Place 1 drop into both eyes at bedtime.      lisinopril  (ZESTRIL ) 5 MG tablet Take 1 tablet (5 mg total) by mouth daily. 90 tablet 1   metFORMIN   (GLUCOPHAGE -XR) 500 MG 24 hr tablet TAKE 4 TABLETS BY MOUTH EVERY DAY 360 tablet 1   montelukast  (SINGULAIR ) 10 MG tablet Take 1 tablet (10 mg total) by mouth at bedtime. 90 tablet 3   omeprazole  (PRILOSEC) 20 MG capsule TAKE 1 CAPSULE BY MOUTH DAILY 100 capsule 2   ondansetron  (ZOFRAN -ODT) 8 MG disintegrating tablet DISSOLVE 1 TABLET(8 MG) ON THE TONGUE EVERY 8 HOURS AS NEEDED FOR NAUSEA OR VOMITING 20 tablet 0   rosuvastatin  (CRESTOR ) 40 MG tablet TAKE 1 TABLET(40 MG) BY MOUTH  AT BEDTIME 90 tablet 1   dapagliflozin  propanediol (FARXIGA ) 5 MG TABS tablet Take 1 tablet (5 mg total) by mouth daily. (Patient not taking: No sig reported) 90 tablet 3   Naproxen Sodium (ALEVE PO) Take 2 tablets by mouth daily as needed (pain).     NONFORMULARY OR COMPOUNDED ITEM Take 1 tablet by mouth at bedtime. Cbd /delta 9 1 po qhs     ondansetron  (ZOFRAN ) 4 MG tablet Take 1 tablet (4 mg total) by mouth as directed. 2 tablet 0   repaglinide  (PRANDIN ) 1 MG tablet Take 2 tablets (2 mg total) by mouth daily. Before evening meal. 180 tablet 0   Current Facility-Administered Medications  Medication Dose Route Frequency Provider Last Rate Last Admin   0.9 %  sodium chloride  infusion  500 mL Intravenous Once Jaden Abreu V, MD        Allergies as of 07/03/2024 - Review Complete 07/03/2024  Allergen Reaction Noted   Augmentin  [amoxicillin -pot clavulanate] Other (See Comments) 04/03/2023   Codeine Nausea Only 01/18/2012    Family History  Problem Relation Age of Onset   Angioedema Mother    Dementia Mother    Breast cancer Mother 7   Kidney failure Mother 29       died from kidney failure from abx given to her for pneumonia   Hyperlipidemia Mother    Hypertension Mother    Diabetes Mother    Cancer Mother 32       breast   Parkinsonism Father    Diabetes Father 75   Hypertension Sister    Diabetes Brother    Hypertension Brother    Heart attack Brother    Heart disease Cousin 42       mi   Colon  cancer Neg Hx    Esophageal cancer Neg Hx    Stomach cancer Neg Hx    Rectal cancer Neg Hx     Social History   Socioeconomic History   Marital status: Married    Spouse name: Not on file   Number of children: Not on file   Years of education: Not on file   Highest education level: 12th grade  Occupational History   Occupation: retired  Tobacco Use   Smoking status: Former    Current packs/day: 0.00    Average packs/day: 1.4 packs/day for 49.0 years (68.6 ttl pk-yrs)    Types: Cigarettes    Start date: 07/15/1966    Quit date: 07/16/2015    Years since quitting: 8.9    Passive exposure: Past   Smokeless tobacco: Never   Tobacco comments:    Has not vaped in 2 weeks 12/05/2023  Vaping Use   Vaping status: Some Days  Substance and Sexual Activity   Alcohol use: Yes    Alcohol/week: 0.0 standard drinks of alcohol    Comment: once weekly; wine and beer   Drug use: No   Sexual activity: Not Currently    Partners: Male  Other Topics Concern   Not on file  Social History Narrative   Exercise--  walk   Social Drivers of Health   Tobacco Use: Medium Risk (07/03/2024)   Patient History    Smoking Tobacco Use: Former    Smokeless Tobacco Use: Never    Passive Exposure: Past  Physicist, Medical Strain: Low Risk (04/25/2024)   Overall Financial Resource Strain (CARDIA)    Difficulty of Paying Living Expenses: Not very hard  Food Insecurity: No Food Insecurity (04/25/2024)   Epic  Worried About Programme Researcher, Broadcasting/film/video in the Last Year: Never true    Ran Out of Food in the Last Year: Never true  Transportation Needs: No Transportation Needs (04/25/2024)   Epic    Lack of Transportation (Medical): No    Lack of Transportation (Non-Medical): No  Physical Activity: Insufficiently Active (02/12/2024)   Exercise Vital Sign    Days of Exercise per Week: 4 days    Minutes of Exercise per Session: 30 min  Stress: Stress Concern Present (02/12/2024)   [employer] of Occupational  Health - Occupational Stress Questionnaire    Feeling of Stress: Rather much  Social Connections: Moderately Isolated (04/25/2024)   Social Connection and Isolation Panel    Frequency of Communication with Friends and Family: More than three times a week    Frequency of Social Gatherings with Friends and Family: More than three times a week    Attends Religious Services: Patient declined    Active Member of Clubs or Organizations: No    Attends Engineer, Structural: Not on file    Marital Status: Married  Intimate Partner Violence: Not At Risk (02/12/2024)   Epic    Fear of Current or Ex-Partner: No    Emotionally Abused: No    Physically Abused: No    Sexually Abused: No  Depression (PHQ2-9): High Risk (06/03/2024)   Depression (PHQ2-9)    PHQ-2 Score: 19  Alcohol Screen: Low Risk (02/12/2024)   Alcohol Screen    Last Alcohol Screening Score (AUDIT): 3  Housing: Unknown (04/25/2024)   Epic    Unable to Pay for Housing in the Last Year: No    Number of Times Moved in the Last Year: Not on file    Homeless in the Last Year: No  Utilities: Not At Risk (02/12/2024)   Epic    Threatened with loss of utilities: No  Health Literacy: Adequate Health Literacy (02/12/2024)   B1300 Health Literacy    Frequency of need for help with medical instructions: Never    Review of Systems:  All other review of systems negative except as mentioned in the HPI.  Physical Exam: Vital signs in last 24 hours: BP (!) 143/78   Pulse 89   Temp 97.7 F (36.5 C)   Ht 5' 6 (1.676 m)   Wt 128 lb (58.1 kg)   SpO2 97%   BMI 20.66 kg/m  General:   Alert, NAD Lungs:  Clear .   Heart:  Regular rate and rhythm Abdomen:  Soft, nontender and nondistended. Neuro/Psych:  Alert and cooperative. Normal mood and affect. A and O x 3  Reviewed labs, radiology imaging, old records and pertinent past GI work up  Patient is appropriate for planned procedure(s) and anesthesia in an ambulatory  setting   K. Veena Gitty Osterlund , MD [phone]

## 2024-07-04 ENCOUNTER — Telehealth: Payer: Self-pay

## 2024-07-04 NOTE — Telephone Encounter (Signed)
" °  Follow up Call-     07/03/2024    8:25 AM  Call back number  Post procedure Call Back phone  # [phone]  Permission to leave phone message Yes     Patient questions:  Do you have a fever, pain , or abdominal swelling? No. Pain Score  0 *  Have you tolerated food without any problems? Yes.    Have you been able to return to your normal activities? Yes.    Do you have any questions about your discharge instructions: Diet   No. Medications  No. Follow up visit  No.  Do you have questions or concerns about your Care? No.  Actions: * If pain score is 4 or above: No action needed, pain <4.   "

## 2024-07-07 LAB — SURGICAL PATHOLOGY

## 2024-07-10 ENCOUNTER — Ambulatory Visit: Payer: Self-pay | Admitting: Gastroenterology

## 2024-07-13 ENCOUNTER — Other Ambulatory Visit: Payer: Self-pay | Admitting: Family Medicine

## 2024-07-13 DIAGNOSIS — S0300XD Dislocation of jaw, unspecified side, subsequent encounter: Secondary | ICD-10-CM

## 2024-07-13 DIAGNOSIS — E119 Type 2 diabetes mellitus without complications: Secondary | ICD-10-CM

## 2024-07-14 ENCOUNTER — Ambulatory Visit: Admitting: Medical

## 2024-07-14 ENCOUNTER — Ambulatory Visit: Payer: Self-pay | Admitting: Radiation Oncology

## 2024-07-14 VITALS — BP 140/70 | HR 85 | Temp 97.7°F | Resp 15 | Ht 66.0 in | Wt 131.8 lb

## 2024-07-14 DIAGNOSIS — L299 Pruritus, unspecified: Secondary | ICD-10-CM | POA: Diagnosis not present

## 2024-07-14 DIAGNOSIS — R21 Rash and other nonspecific skin eruption: Secondary | ICD-10-CM

## 2024-07-14 DIAGNOSIS — Z8619 Personal history of other infectious and parasitic diseases: Secondary | ICD-10-CM | POA: Diagnosis not present

## 2024-07-14 DIAGNOSIS — L853 Xerosis cutis: Secondary | ICD-10-CM | POA: Diagnosis not present

## 2024-07-14 MED ORDER — FAMCICLOVIR 500 MG PO TABS: 500.0000 mg | ORAL_TABLET | Freq: Three times a day (TID) | ORAL | 0 refills | Status: AC

## 2024-07-14 NOTE — Patient Instructions (Addendum)
 Recurrent shingles  possible early on rt side versus postherpetic pruritus with xerosis Bilateral itching without obvious rash, previously diagnosed as mild shingles. Persistent symptoms despite antivirals and moisturizers. Differential includes recurrent shingles, postherpetic pruritus, and xerosis. Bilateral presentation would be atypical for shingles. Consider postherpetic neuralgia if itching persists without rash. - Prescribed Famvir  500 mg TID. - Recommended Palmer's moisturizing lotion with vitamin E. - Follow-up with myself or Dr. Antonio or myself in 7-10 days.

## 2024-07-14 NOTE — Progress Notes (Signed)
 "  Subjective:    Patient ID: Alyssa Ford, female    DOB: 1950-09-23, 74 y.o.   MRN: [id number]  HPI  Alyssa Ford is a 74 year old female who presents with persistent bilateral torso itching.  The itching began after an episode of shingles in November, initially on the right torso from her back around to her waist. After the rash resolved she continued to have significant itching, was seen in urgent care in December, and received antiviral medication with partial temporary relief. The itching has recurred and is now bilateral, with the right side worse than the left.  I Important to note in the ED they stated.  bilateral torso itching and itching of the right forearm.  No appreciable rash visualized on exam.  Vital signs are stable and exam is reassuring overall.  Will treat itching with topical emollient, oral antihistamine.  Recommended Valtrex  if blisters/lesions develop consistent with shingles and prescription sent to pharmacy.  Return and ER precautions discussed.  She states never got vesicular outbreak but went ahead and took second round of antiviral.   On review of Dr. Watt note in November looked to have mild early shingles eruption on rt side lower rib area. In December she did not have recurrent break out in this area.  She denies stomach pain, cough, fever, chills, or pain with deep breathing. The itching does not significantly disrupt her sleep because applying cream allows her to return to sleep.     Review of Systems  Constitutional:  Negative for chills and fever.  HENT:  Negative for dental problem.   Respiratory:  Negative for cough, choking and wheezing.   Cardiovascular:  Negative for chest pain and palpitations.  Gastrointestinal:  Negative for abdominal pain.  Genitourinary:  Negative for dysuria.  Skin:  Positive for rash.       See exam and hpi.  Neurological:  Negative for dizziness and weakness.  Hematological:  Negative for adenopathy.   Psychiatric/Behavioral:  Negative for behavioral problems and decreased concentration. The patient is not nervous/anxious.     Past Medical History:  Diagnosis Date   Anemia    Anxiety    Arthritis    Cataract    Chronic headaches    pt states that they have improved - 08/06/23   Coronary artery disease    Depression    Diabetes mellitus    Diverticulosis of colon (without mention of hemorrhage) 2003, 2008   Colonoscopy   Eczema    Emphysema of lung (HCC)    GERD (gastroesophageal reflux disease)    Glaucoma    Hiatal hernia 2003   EGD    History of pneumonia 11/2022   Hyperlipidemia    Hypertension    PONV (postoperative nausea and vomiting)    Thyroid  nodule      Social History   Socioeconomic History   Marital status: Married    Spouse name: Not on file   Number of children: Not on file   Years of education: Not on file   Highest education level: 12th grade  Occupational History   Occupation: retired  Tobacco Use   Smoking status: Former    Current packs/day: 0.00    Average packs/day: 1.4 packs/day for 49.0 years (68.6 ttl pk-yrs)    Types: Cigarettes    Start date: 07/15/1966    Quit date: 07/16/2015    Years since quitting: 9.0    Passive exposure: Past   Smokeless tobacco: Never   Tobacco  comments:    Has not vaped in 2 weeks 12/05/2023  Vaping Use   Vaping status: Some Days  Substance and Sexual Activity   Alcohol use: Yes    Alcohol/week: 0.0 standard drinks of alcohol    Comment: once weekly; wine and beer   Drug use: No   Sexual activity: Not Currently    Partners: Male  Other Topics Concern   Not on file  Social History Narrative   Exercise--  walk   Social Drivers of Health   Tobacco Use: Medium Risk (07/03/2024)   Patient History    Smoking Tobacco Use: Former    Smokeless Tobacco Use: Never    Passive Exposure: Past  Physicist, Medical Strain: Low Risk (04/25/2024)   Overall Financial Resource Strain (CARDIA)    Difficulty of Paying  Living Expenses: Not very hard  Food Insecurity: No Food Insecurity (04/25/2024)   Epic    Worried About Programme Researcher, Broadcasting/film/video in the Last Year: Never true    Ran Out of Food in the Last Year: Never true  Transportation Needs: No Transportation Needs (04/25/2024)   Epic    Lack of Transportation (Medical): No    Lack of Transportation (Non-Medical): No  Physical Activity: Insufficiently Active (02/12/2024)   Exercise Vital Sign    Days of Exercise per Week: 4 days    Minutes of Exercise per Session: 30 min  Stress: Stress Concern Present (02/12/2024)   [employer] of Occupational [employer] - Occupational Stress Questionnaire    Feeling of Stress: Rather much  Social Connections: Moderately Isolated (04/25/2024)   Social Connection and Isolation Panel    Frequency of Communication with Friends and Family: More than three times a week    Frequency of Social Gatherings with Friends and Family: More than three times a week    Attends Religious Services: Patient declined    Active Member of Clubs or Organizations: No    Attends Banker Meetings: Not on file    Marital Status: Married  Intimate Partner Violence: Not At Risk (02/12/2024)   Epic    Fear of Current or Ex-Partner: No    Emotionally Abused: No    Physically Abused: No    Sexually Abused: No  Depression (PHQ2-9): Medium Risk (07/14/2024)   Depression (PHQ2-9)    PHQ-2 Score: 7  Alcohol Screen: Low Risk (02/12/2024)   Alcohol Screen    Last Alcohol Screening Score (AUDIT): 3  Housing: Unknown (04/25/2024)   Epic    Unable to Pay for Housing in the Last Year: No    Number of Times Moved in the Last Year: Not on file    Homeless in the Last Year: No  Utilities: Not At Risk (02/12/2024)   Epic    Threatened with loss of utilities: No  Health Literacy: Adequate Health Literacy (02/12/2024)   B1300 Health Literacy    Frequency of need for help with medical instructions: Never    Past Surgical History:   Procedure Laterality Date   BRONCHIAL BIOPSY  08/07/2023   Procedure: BRONCHIAL BIOPSIES;  Surgeon: Shelah Lamar RAMAN, MD;  Location: [facility];  Service: Pulmonary;;   BRONCHIAL BRUSHINGS  08/07/2023   Procedure: BRONCHIAL BRUSHINGS;  Surgeon: Shelah Lamar RAMAN, MD;  Location: [facility] END[facility]COPY;  Service: Pulmonary;;   BRONCHIAL NEEDLE ASPIRATION BIOPSY  08/07/2023   Procedure: BRONCHIAL NEEDLE ASPIRATION BIOPSIES;  Surgeon: Shelah Lamar RAMAN, MD;  Location: [facility] ENDOSCOPY;  Service: Pulmonary;;   CARPAL TUNNEL RELEASE  2006  CARPAL TUNNEL RELEASE  12/24/2007   CATARACT EXTRACTION, BILATERAL     [place]---  sept and oct 2018   CERVICAL FUSION  08/2006   CHOLECYSTECTOMY     COSMETIC SURGERY  December 09 2018   EYE SURGERY  catracts   FASCIECTOMY Left 08/01/2018   Procedure: FASCIECTOMY LEFT RING;  Surgeon: Murrell Kuba, MD;  Location: [facility];  Service: Orthopedics;  Laterality: Left;  UPPER ARM   FIDUCIAL MARKER PLACEMENT  08/07/2023   Procedure: FIDUCIAL MARKER PLACEMENT;  Surgeon: Shelah Lamar RAMAN, MD;  Location: [facility] ENDOSCOPY;  Service: Pulmonary;;   SPINE SURGERY  neck dic replaced   TRIGGER FINGER RELEASE Left 08/01/2018   Procedure: RELEASE TRIGGER FINGER/A-1 PULLEY LEFTRING;  Surgeon: Murrell Kuba, MD;  Location: [facility];  Service: Orthopedics;  Laterality: Left;   TUBAL LIGATION      Family History  Problem Relation Age of Onset   Angioedema Mother    Dementia Mother    Breast cancer Mother 82   Kidney failure Mother 5       died from kidney failure from abx given to her for pneumonia   Hyperlipidemia Mother    Hypertension Mother    Diabetes Mother    Cancer Mother 41       breast   Parkinsonism Father    Diabetes Father 40   Hypertension Sister    Diabetes Brother    Hypertension Brother    Heart attack Brother    Heart disease Cousin 82       mi   Colon cancer Neg Hx    Esophageal cancer Neg Hx    Stomach cancer Neg Hx    Rectal cancer Neg  Hx     Allergies[1]  Medications Ordered Prior to Encounter[2]  BP (!) 140/70   Pulse 85   Temp 97.7 F (36.5 C) (Oral)   Resp 15   Ht 5' 6 (1.676 m)   Wt 131 lb 12.8 oz (59.8 kg)   SpO2 98%   BMI 21.27 kg/m        Objective:   Physical Exam  General- No acute distress. Pleasant patient. Neck- Full range of motion, no jvd Lungs- Clear, even and unlabored. Heart- regular rate and rhythm. Neurologic- CNII- XII grossly intact.  Skin- no rash seen on back except rt side lower back very faint slight pinkish red area that feels mild dry. No vesicles. Area about 1 cm x 3 cm.      Assessment & Plan:   Recurrent shingles  possible early on rt sideversus postherpetic pruritus with xerosis Bilateral itching without obvious rash, previously diagnosed as mild shingles. Persistent symptoms despite antivirals and moisturizers. Differential includes recurrent shingles, postherpetic pruritus, and xerosis. Bilateral presentation would be atypical for shingles. Consider postherpetic neuralgia if itching persists without rash. - Prescribed Famvir  500 mg TID. - Recommended Palmer's moisturizing lotion with vitamin E. - Follow-up with myself or Dr. Antonio or myself in 7-10 days.  Aarit Kashuba, PA-C     [1]  Allergies Allergen Reactions   Augmentin  [Amoxicillin -Pot Clavulanate] Other (See Comments)    Severe nausea and vomitting   Codeine Nausea Only    Severe nausea  [2]  Current Outpatient Medications on File Prior to Visit  Medication Sig Dispense Refill   amitriptyline  (ELAVIL ) 25 MG tablet TAKE 2 TABLETS(50 MG) BY MOUTH AT BEDTIME 180 tablet 2   aspirin  EC 81 MG tablet Take 81 mg by mouth daily.  BIOTIN PO Take 1 tablet by mouth daily.      Coenzyme Q10 (CO Q 10 PO) Take 1 capsule by mouth daily.     fenofibrate  160 MG tablet Take 1 tablet (160 mg total) by mouth daily. 90 tablet 1   Ferrous Sulfate (IRON SUPPLEMENT PO) Take 1 tablet by mouth daily. 65mg      ipratropium  (ATROVENT ) 0.06 % nasal spray Place 2 sprays into both nostrils 3 (three) times daily.     latanoprost  (XALATAN ) 0.005 % ophthalmic solution Place 1 drop into both eyes at bedtime.      lisinopril  (ZESTRIL ) 5 MG tablet Take 1 tablet (5 mg total) by mouth daily. 90 tablet 1   metFORMIN  (GLUCOPHAGE -XR) 500 MG 24 hr tablet TAKE 4 TABLETS BY MOUTH EVERY DAY 360 tablet 1   montelukast  (SINGULAIR ) 10 MG tablet Take 1 tablet (10 mg total) by mouth at bedtime. 90 tablet 3   Naproxen Sodium (ALEVE PO) Take 2 tablets by mouth daily as needed (pain).     NONFORMULARY OR COMPOUNDED ITEM Take 1 tablet by mouth at bedtime. Cbd /delta 9 1 po qhs     omeprazole  (PRILOSEC) 20 MG capsule TAKE 1 CAPSULE BY MOUTH DAILY 100 capsule 2   ondansetron  (ZOFRAN ) 4 MG tablet Take 1 tablet (4 mg total) by mouth as directed. 2 tablet 0   ondansetron  (ZOFRAN -ODT) 8 MG disintegrating tablet DISSOLVE 1 TABLET(8 MG) ON THE TONGUE EVERY 8 HOURS AS NEEDED FOR NAUSEA OR VOMITING 20 tablet 0   repaglinide  (PRANDIN ) 1 MG tablet TAKE 2 TABLETS(2 MG) BY MOUTH DAILY BEFORE THE EVENING MEAL 180 tablet 0   rosuvastatin  (CRESTOR ) 40 MG tablet TAKE 1 TABLET(40 MG) BY MOUTH AT BEDTIME 90 tablet 1   Current Facility-Administered Medications on File Prior to Visit  Medication Dose Route Frequency Provider Last Rate Last Admin   0.9 %  sodium chloride  infusion  500 mL Intravenous Once Nandigam, Kavitha V, MD       "

## 2024-07-20 ENCOUNTER — Other Ambulatory Visit: Payer: Self-pay | Admitting: Family Medicine

## 2024-07-20 DIAGNOSIS — K219 Gastro-esophageal reflux disease without esophagitis: Secondary | ICD-10-CM

## 2024-07-22 ENCOUNTER — Ambulatory Visit: Admitting: Family Medicine

## 2024-08-01 ENCOUNTER — Ambulatory Visit: Admitting: Family Medicine

## 2024-08-11 ENCOUNTER — Ambulatory Visit: Admitting: Family Medicine

## 2024-08-26 ENCOUNTER — Other Ambulatory Visit

## 2024-08-27 ENCOUNTER — Other Ambulatory Visit

## 2024-09-09 ENCOUNTER — Ambulatory Visit: Admitting: Radiation Oncology

## 2024-09-15 ENCOUNTER — Ambulatory Visit: Admitting: Radiation Oncology
